# Patient Record
Sex: Female | Born: 1950
Health system: Southern US, Community
[De-identification: ages and names within clinical notes are randomized; demographics above are authoritative.]

## PROBLEM LIST (undated history)

## (undated) DIAGNOSIS — K760 Fatty (change of) liver, not elsewhere classified: Secondary | ICD-10-CM

## (undated) DIAGNOSIS — K219 Gastro-esophageal reflux disease without esophagitis: Secondary | ICD-10-CM

## (undated) DIAGNOSIS — I739 Peripheral vascular disease, unspecified: Secondary | ICD-10-CM

## (undated) DIAGNOSIS — M869 Osteomyelitis, unspecified: Secondary | ICD-10-CM

## (undated) DIAGNOSIS — IMO0002 Reserved for concepts with insufficient information to code with codable children: Secondary | ICD-10-CM

## (undated) DIAGNOSIS — I10 Essential (primary) hypertension: Secondary | ICD-10-CM

## (undated) HISTORY — PX: WISDOM TOOTH EXTRACTION: SHX21

## (undated) HISTORY — PX: TUBAL LIGATION: SHX77

## (undated) HISTORY — PX: OTHER SURGICAL HISTORY: SHX169

## (undated) HISTORY — DX: Reserved for concepts with insufficient information to code with codable children: IMO0002

---

## 2000-08-08 ENCOUNTER — Encounter: Payer: Self-pay | Admitting: Internal Medicine

## 2000-08-08 ENCOUNTER — Encounter: Admission: RE | Admit: 2000-08-08 | Discharge: 2000-08-08 | Payer: Self-pay | Admitting: Internal Medicine

## 2005-03-19 ENCOUNTER — Ambulatory Visit (HOSPITAL_COMMUNITY): Admission: RE | Admit: 2005-03-19 | Discharge: 2005-03-19 | Payer: Self-pay | Admitting: Gastroenterology

## 2007-03-11 ENCOUNTER — Encounter: Admission: RE | Admit: 2007-03-11 | Discharge: 2007-03-11 | Payer: Self-pay | Admitting: Internal Medicine

## 2007-03-11 IMAGING — MG MM SCREEN MAMMOGRAM BILATERAL
5 series · 5 of 5 positions shown · non-contrast
Comparison: none

DG SCREEN MAMMOGRAM BILATERAL
Bilateral CC and MLO view(s) were taken.

DIGITAL SCREENING MAMMOGRAM WITH CAD:
The breast tissue is almost entirely fatty.  A possible mass is noted in the left breast.  Spot 
compression views and possibly sonography are recommended for further evaluation.  The right breast
is unremarkable.

[R CC]
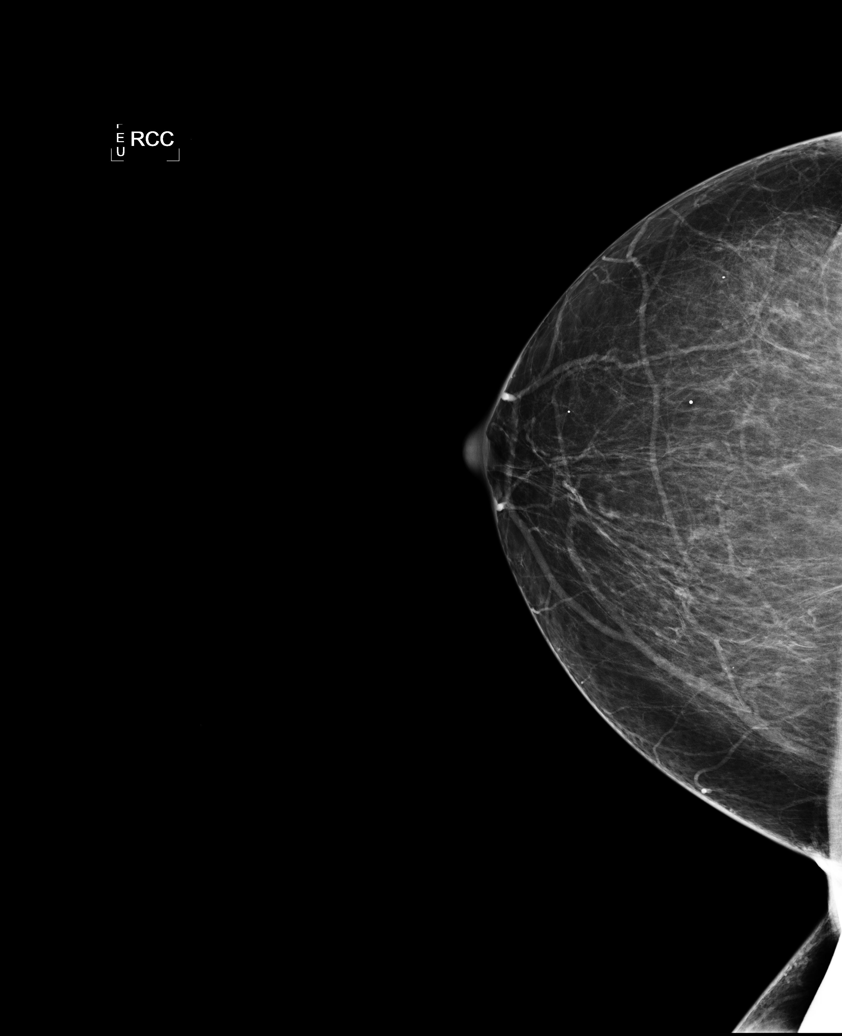

[L CC]
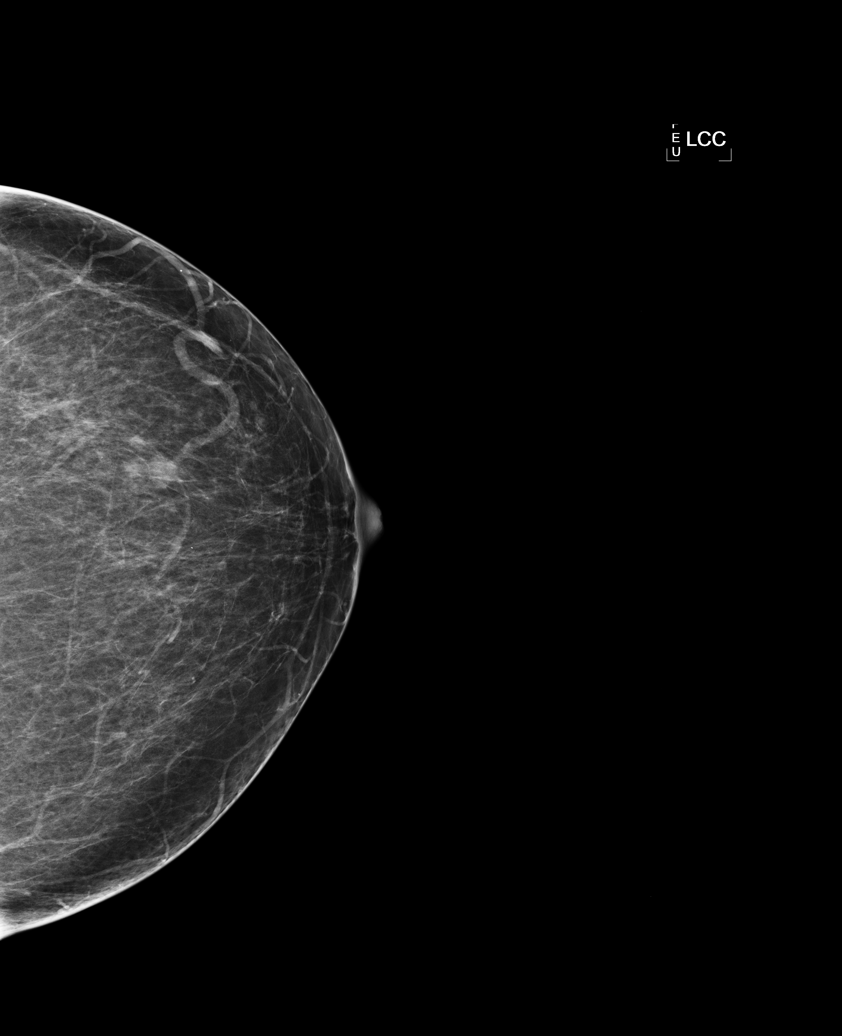

[L MLO (1 of 2)]
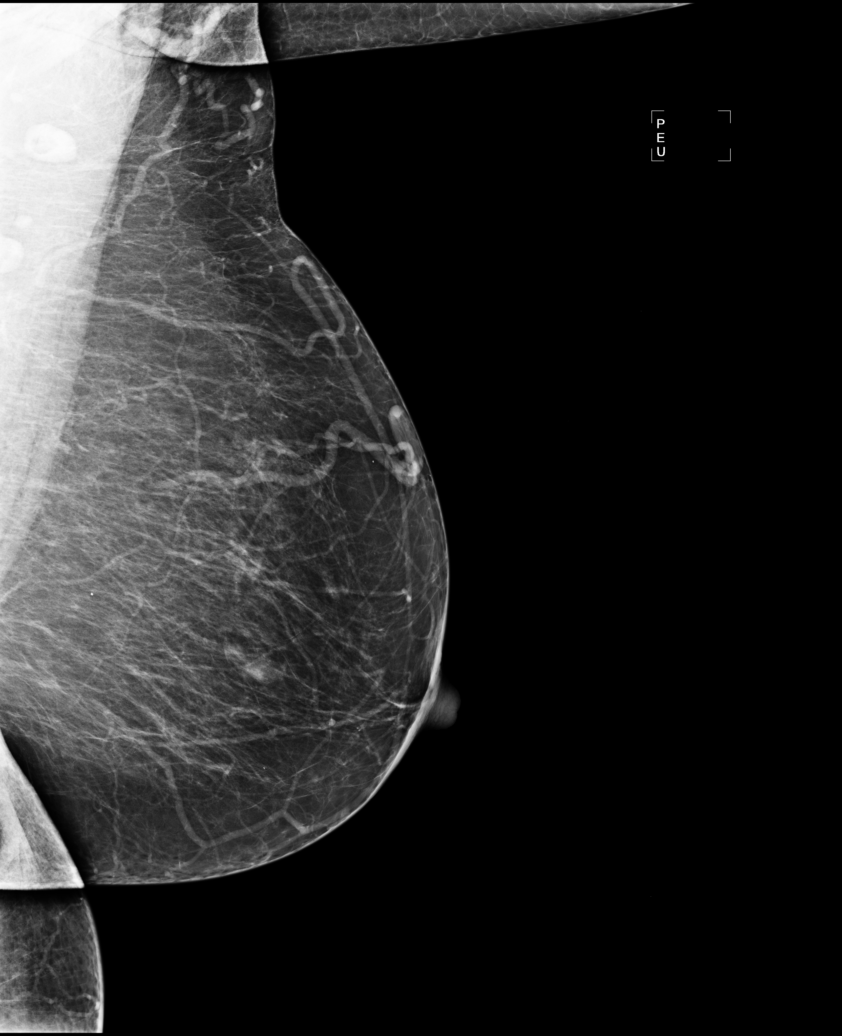

[R MLO]
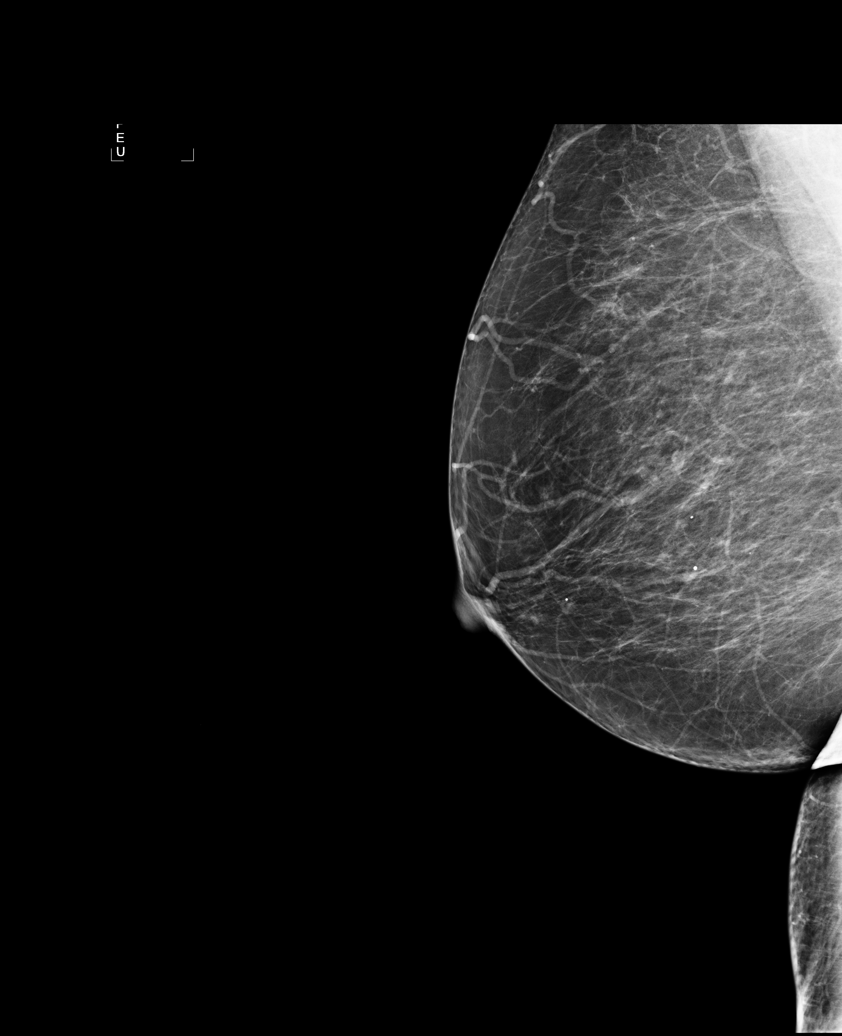

[L MLO (2 of 2)]
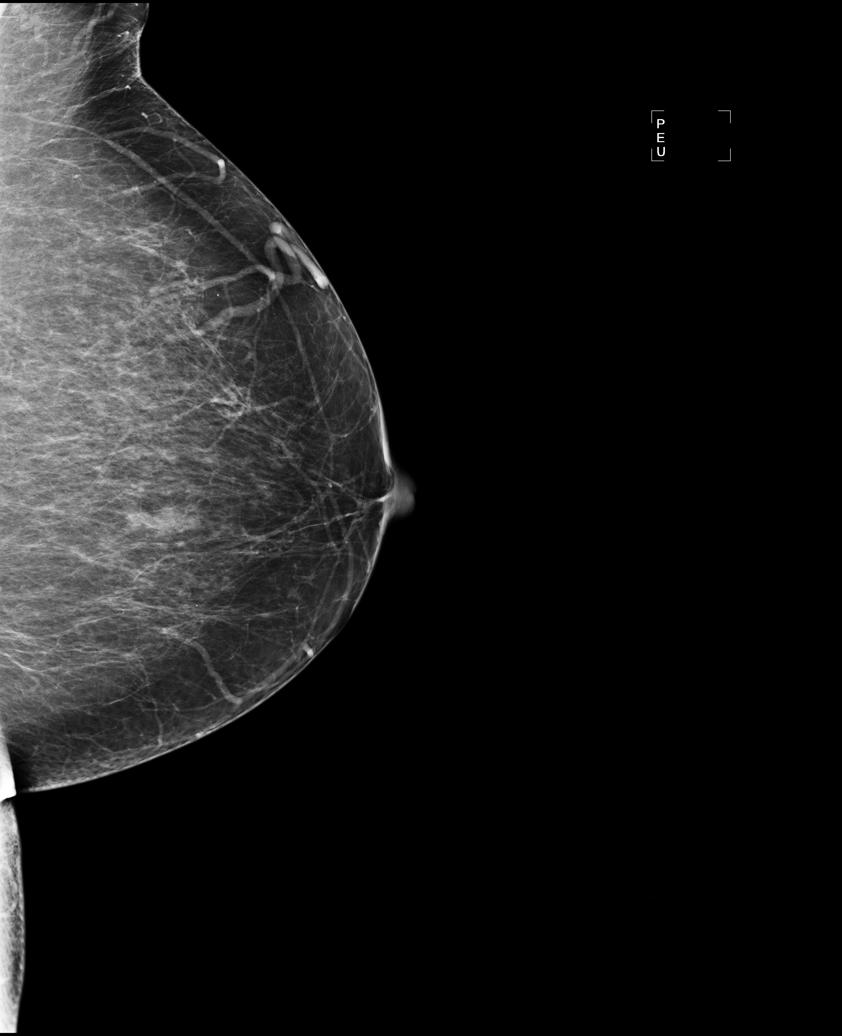

[5 of 5 positions shown; findings below may reference images not displayed]

IMPRESSION: Possible mass, left breast.  Additional evaluation is indicated.  The patient will be contacted for
additional studies and a supplemental report will follow

ASSESSMENT: Need additional imaging evaluation and/or prior mammograms for comparison - BI-RADS 0 -
Left

Further imaging of the left breast.
ANALYZED BY COMPUTER AIDED DETECTION. , THIS PROCEDURE WAS A DIGITAL MAMMOGRAM.

## 2007-03-18 ENCOUNTER — Encounter: Admission: RE | Admit: 2007-03-18 | Discharge: 2007-03-18 | Payer: Self-pay | Admitting: Internal Medicine

## 2007-03-18 IMAGING — US UNKNOWN PR STUDY
1 series · 5 of 5 positions shown · non-contrast
Comparison: none

Addendum Begins
Addendum

REVIEW OF OUTSIDE STUDY ([DATE])
Films from [DATE] performed at [facility] in [HOSPITAL] are now available for 
comparison. Comparison is also made  to [DATE] and [DATE].  The asymmetric density in the left 
breast is stable compared to that exam, consistent with benign process.  Given the long term 
stability, no further evaluation is thought to be necessary at this time.  Screening mammogram is 
suggested in [DATE].
ASSESSMENT:  Benign - BI-RADS 2
,
Addendum Ends
[REDACTED] LEFT
CC and MLO view(s) were taken of the left breast.
Technologist: MANDEMA
MANDEMA BREAST ULTRASOUND
Technologist: MANDEMA, Medical
DIGITAL LIMITED LEFT DIAGNOSTIC MAMMOGRAM AND LEFT BREAST ULTRASOUND:
CLINICAL DATA: 55-year-old returns after screening study on [DATE] for evaluation of the left 
breast.

[Series 1: unknown pr study · 5 of 5 slices shown]
[im 1/5]
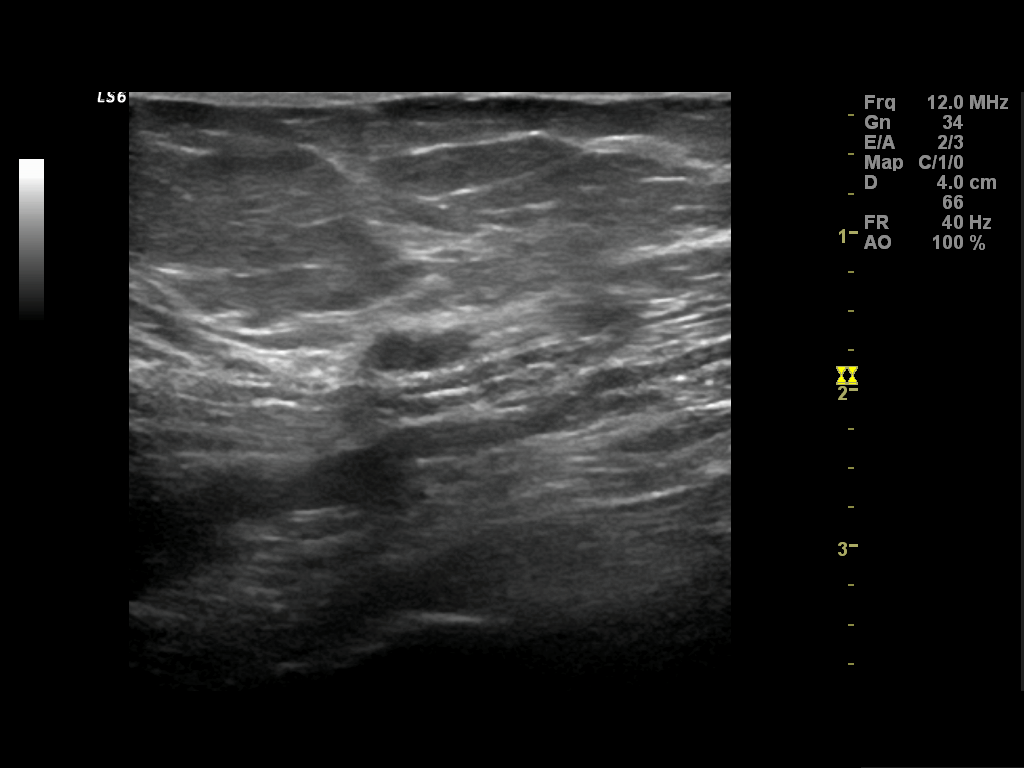
[im 2/5]
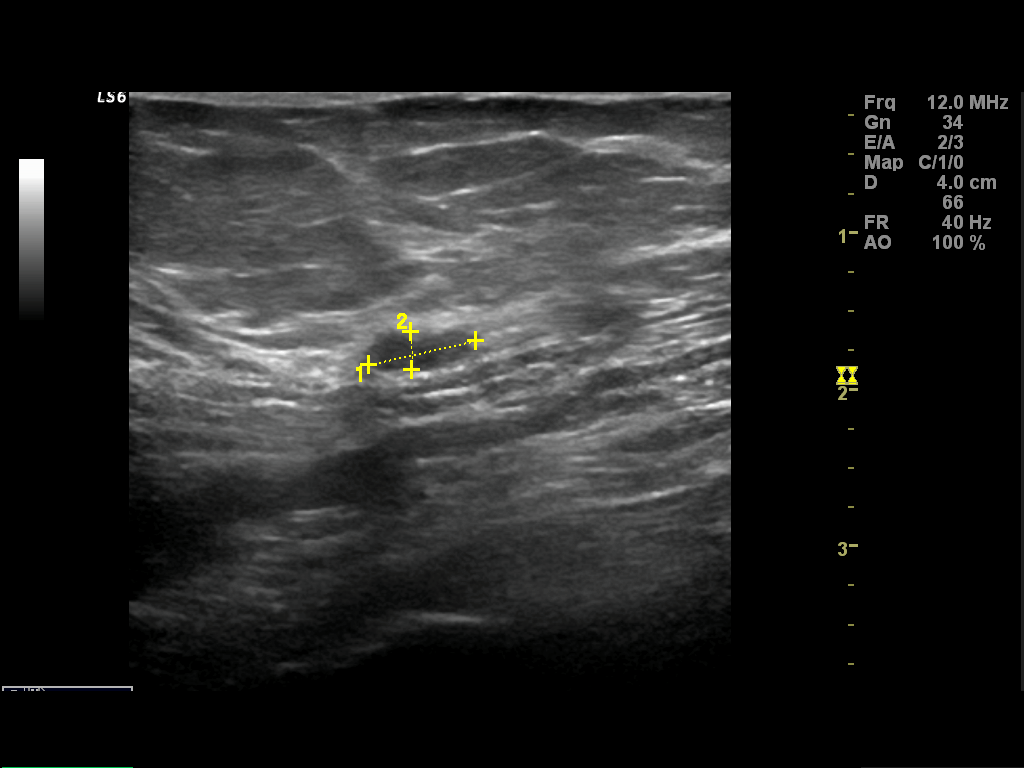
[im 3/5]
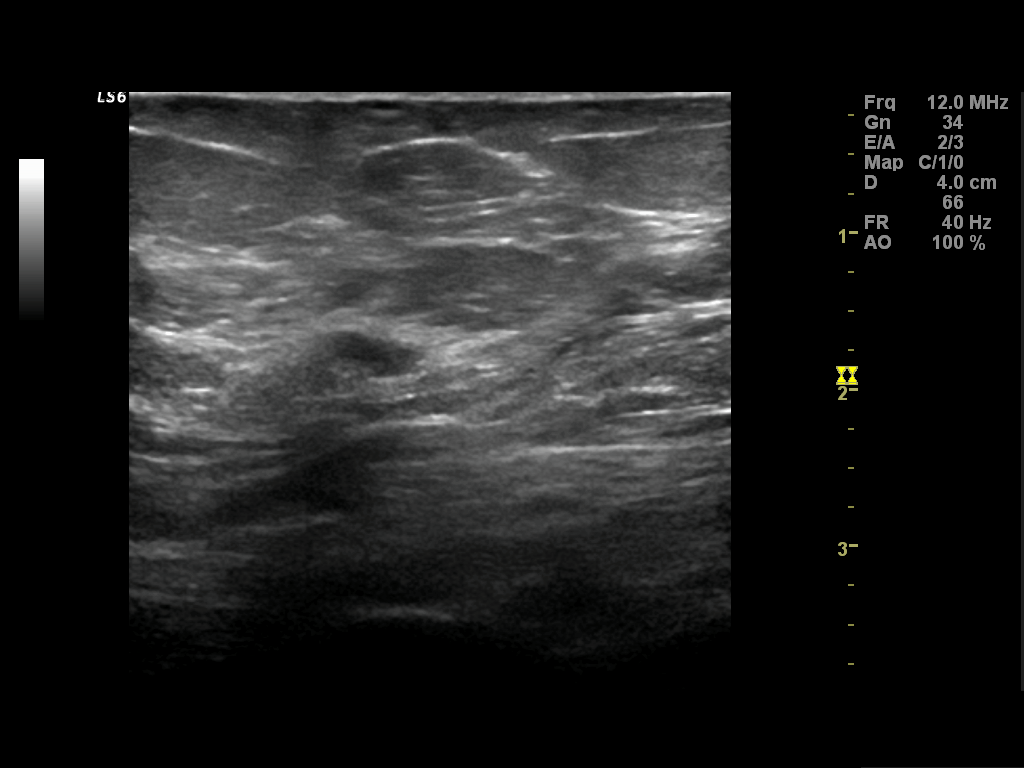
[im 4/5]
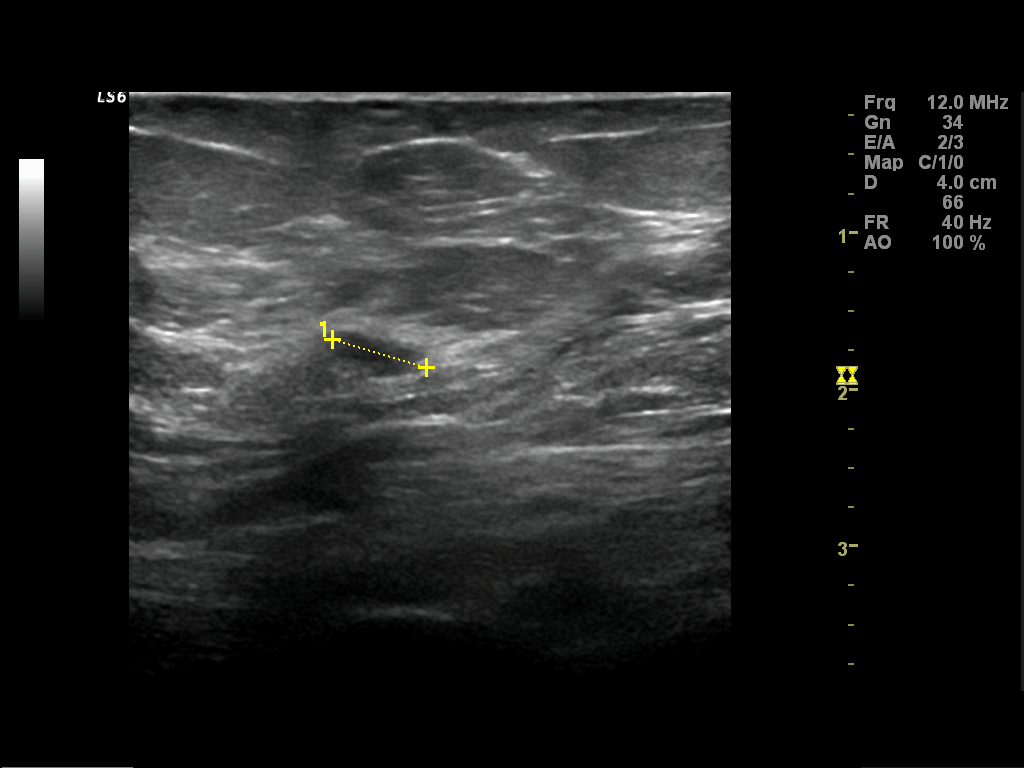
[im 5/5]
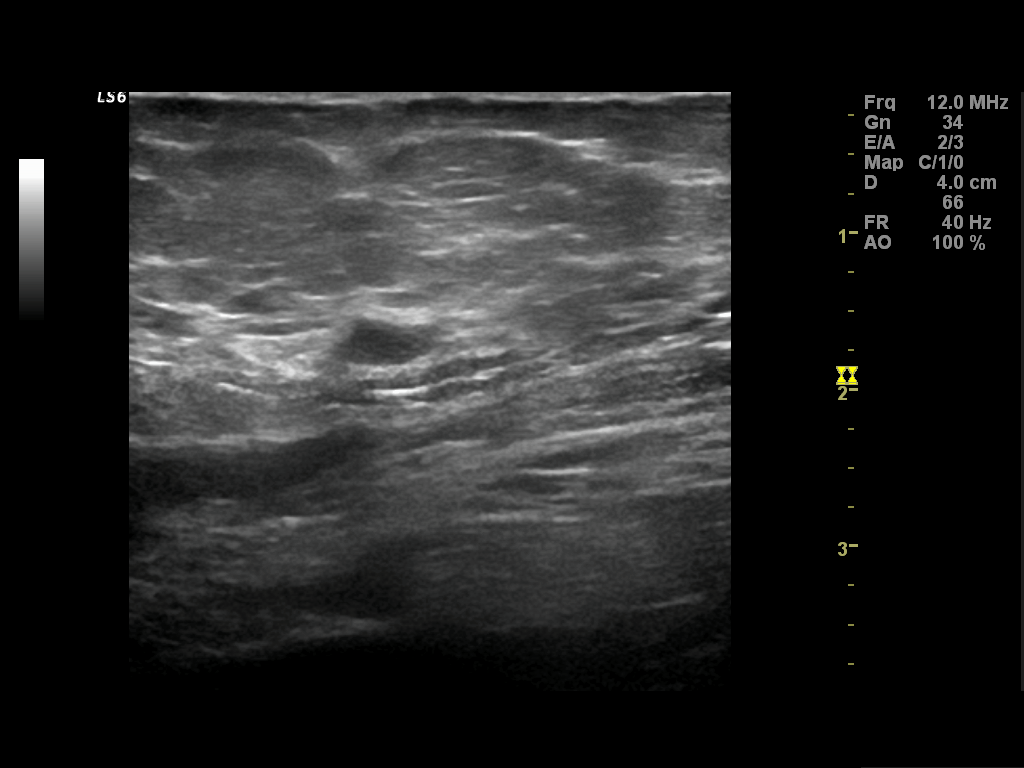

[5 of 5 positions shown; findings below may reference images not displayed]

Spot compression views show low density asymmetry just lateral to the left nipple. This is further 
evaluated on ultrasound.

On physical exam, I do not palpate an abnormality in the lateral part of the left breast.  
Ultrasound is performed in the lateral quadrants showing a small hypoechoic well-circumscribed 
nodule in the 4 o'clock position of the left breast 4 cm from the nipple.  This measures 7 x 2 x 6 
mm and correlates well with the region of mammographic abnormality.  The findings favor a small 
intramammary lymph node or small fibroadenoma.  I would suggest a follow-up left ultrasound and 
mammogram in six months to assess stability.
IMPRESSION: Probably benign left breast nodule.  The patient reports having had a mobile mammogram performed in
[4E] [4E] at [facility]. We will attempt to obtain that exam for direct comparison.  If 
prior studies cannot be obtained for comparison, I would suggest a follow-up mammogram and 
ultrasound in six months.

ASSESSMENT: Need additional imaging evaluation and/or prior mammograms for comparison - BI-RADS 0

Obtain prior study for comparison.
,

## 2007-03-18 IMAGING — MG MM DIAGNOSTIC LTD LEFT
2 series · 2 of 2 positions shown · non-contrast
Comparison: none

Addendum Begins
Addendum

REVIEW OF OUTSIDE STUDY ([DATE])
Films from [DATE] performed at [facility] in [HOSPITAL] are now available for 
comparison. Comparison is also made  to [DATE] and [DATE].  The asymmetric density in the left 
breast is stable compared to that exam, consistent with benign process.  Given the long term 
stability, no further evaluation is thought to be necessary at this time.  Screening mammogram is 
suggested in [DATE].
ASSESSMENT:  Benign - BI-RADS 2
,
Addendum Ends
[REDACTED] LEFT
CC and MLO view(s) were taken of the left breast.
Technologist: MANDEMA
MANDEMA BREAST ULTRASOUND
Technologist: MANDEMA, Medical
DIGITAL LIMITED LEFT DIAGNOSTIC MAMMOGRAM AND LEFT BREAST ULTRASOUND:
CLINICAL DATA: 55-year-old returns after screening study on [DATE] for evaluation of the left 
breast.

[L CC]
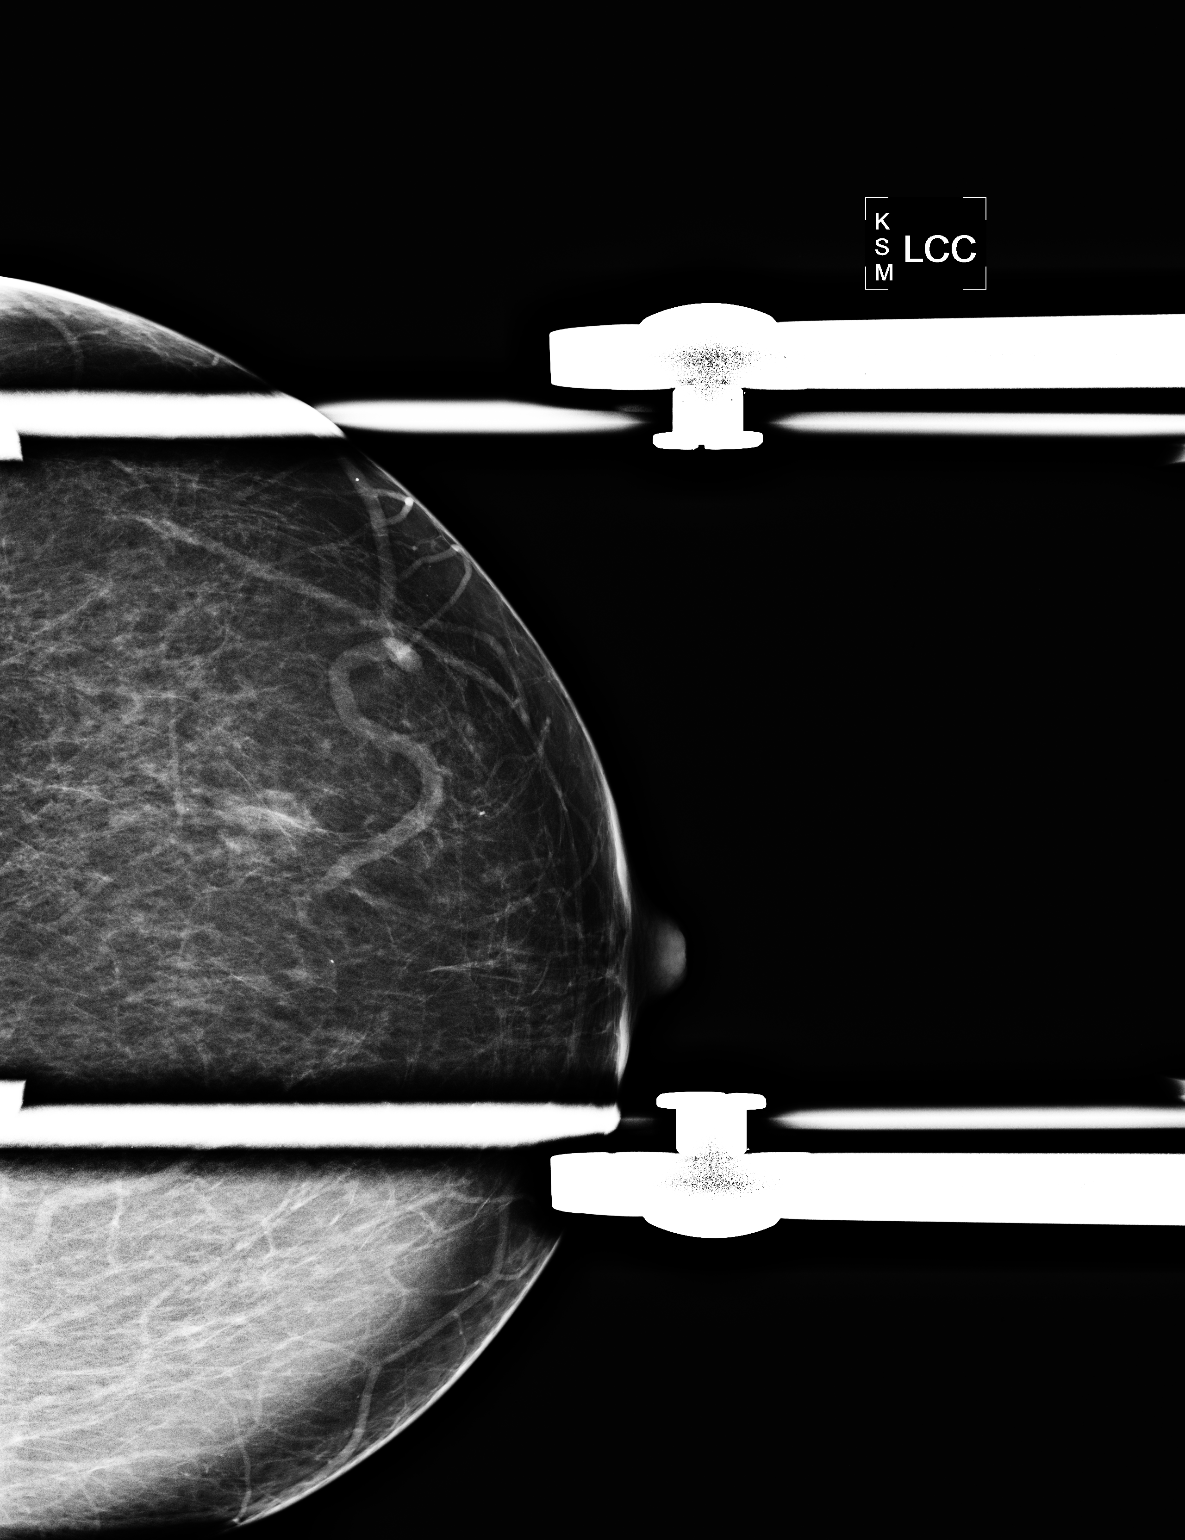

[L MLO]
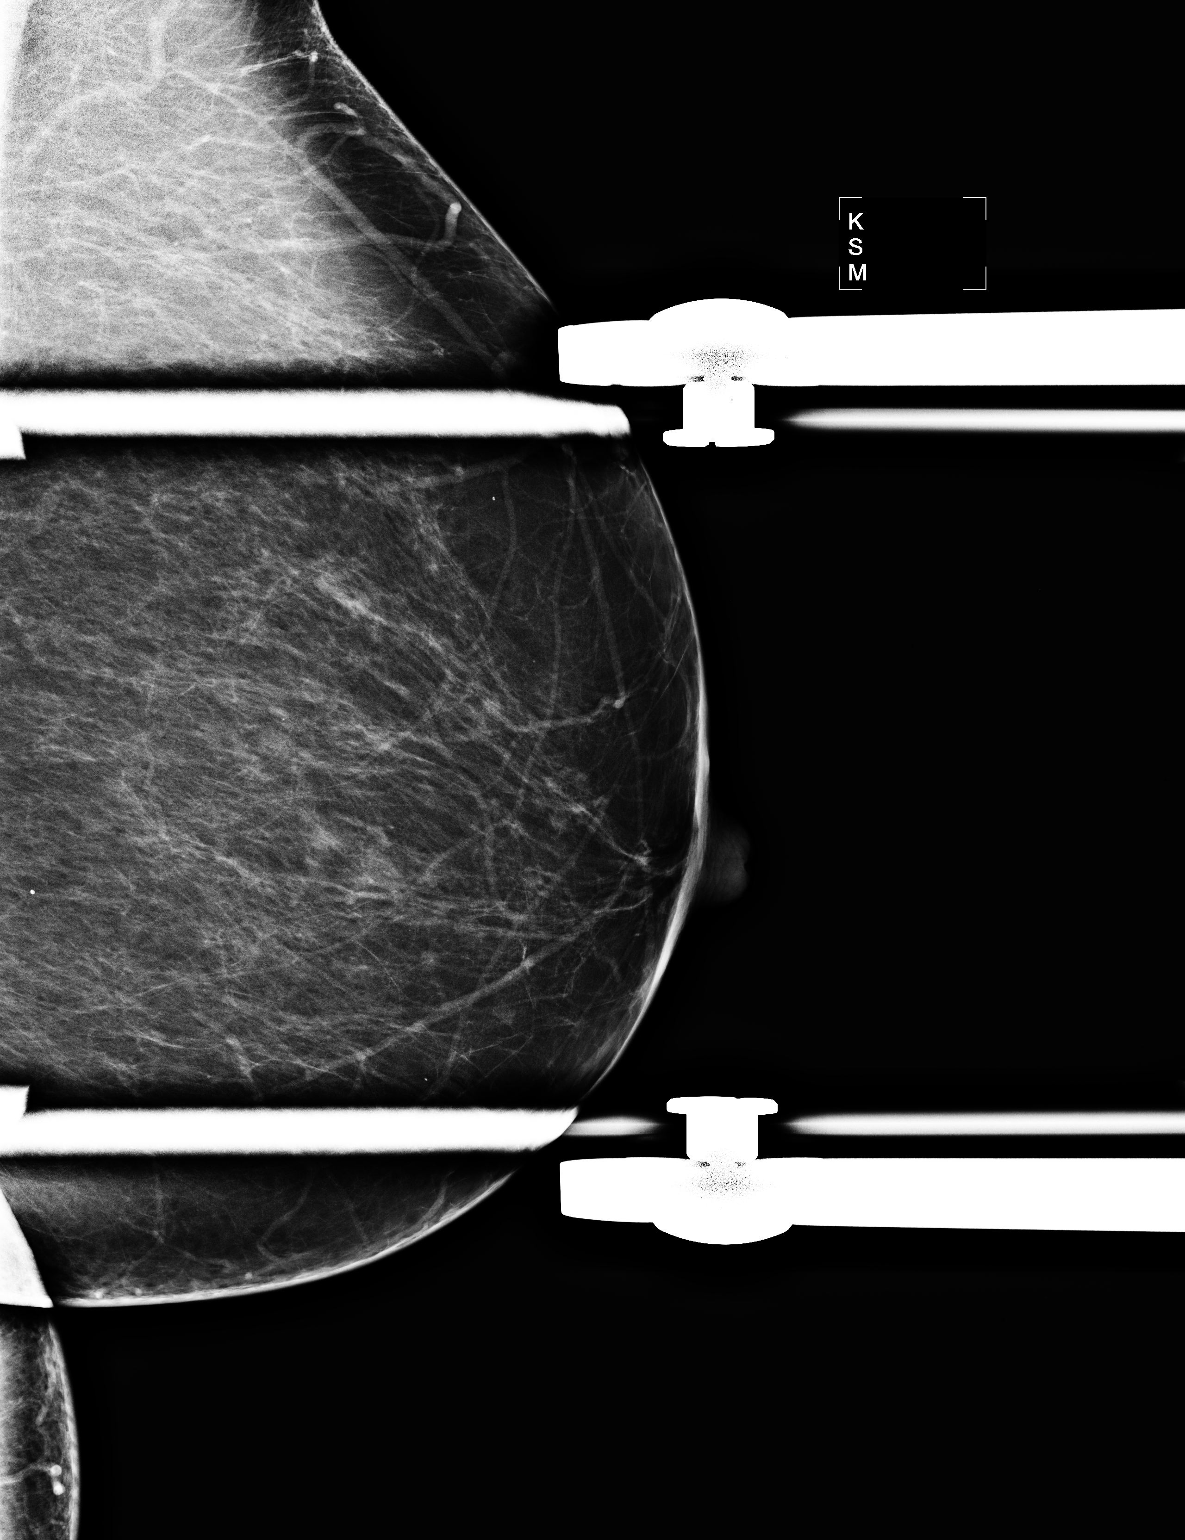

[2 of 2 positions shown; findings below may reference images not displayed]

Spot compression views show low density asymmetry just lateral to the left nipple. This is further 
evaluated on ultrasound.

On physical exam, I do not palpate an abnormality in the lateral part of the left breast.  
Ultrasound is performed in the lateral quadrants showing a small hypoechoic well-circumscribed 
nodule in the 4 o'clock position of the left breast 4 cm from the nipple.  This measures 7 x 2 x 6 
mm and correlates well with the region of mammographic abnormality.  The findings favor a small 
intramammary lymph node or small fibroadenoma.  I would suggest a follow-up left ultrasound and 
mammogram in six months to assess stability.
IMPRESSION: Probably benign left breast nodule.  The patient reports having had a mobile mammogram performed in
[4E] [4E] at [facility]. We will attempt to obtain that exam for direct comparison.  If 
prior studies cannot be obtained for comparison, I would suggest a follow-up mammogram and 
ultrasound in six months.

ASSESSMENT: Need additional imaging evaluation and/or prior mammograms for comparison - BI-RADS 0

Obtain prior study for comparison.
,

## 2008-03-28 ENCOUNTER — Encounter: Admission: RE | Admit: 2008-03-28 | Discharge: 2008-03-28 | Payer: Self-pay | Admitting: Internal Medicine

## 2008-03-28 IMAGING — MG MM DIAGNOSTIC BILATERAL
4 series · 4 of 4 positions shown · non-contrast
Comparison: Mobile unit mammogram [facility]HOSPITAL]
[DATE], [DATE]

Addendum Begins

Due to a scheduling error, the patient was erroneously
scheduled for diagnostic mammography rather than screening
mammography as recommended in the addendum of a previous
report dated [DATE](addendum dictated [DATE]).  The
report, conclusions, and recommendations are correct as
stated in the [DATE] report but the title of the report
should be changed to a screening mammogram and the patient's
charges will reflect this change.
Addendum Ends
CLINICAL DATA: Follow up left breast nodularity.  The patient was
to return for 6-month follow-up in [DATE] but did not return.
She presents today for annual mammography.
DIGITAL DIAGNOSTIC BILATERAL MAMMOGRAM WITH CAD

[R CC]
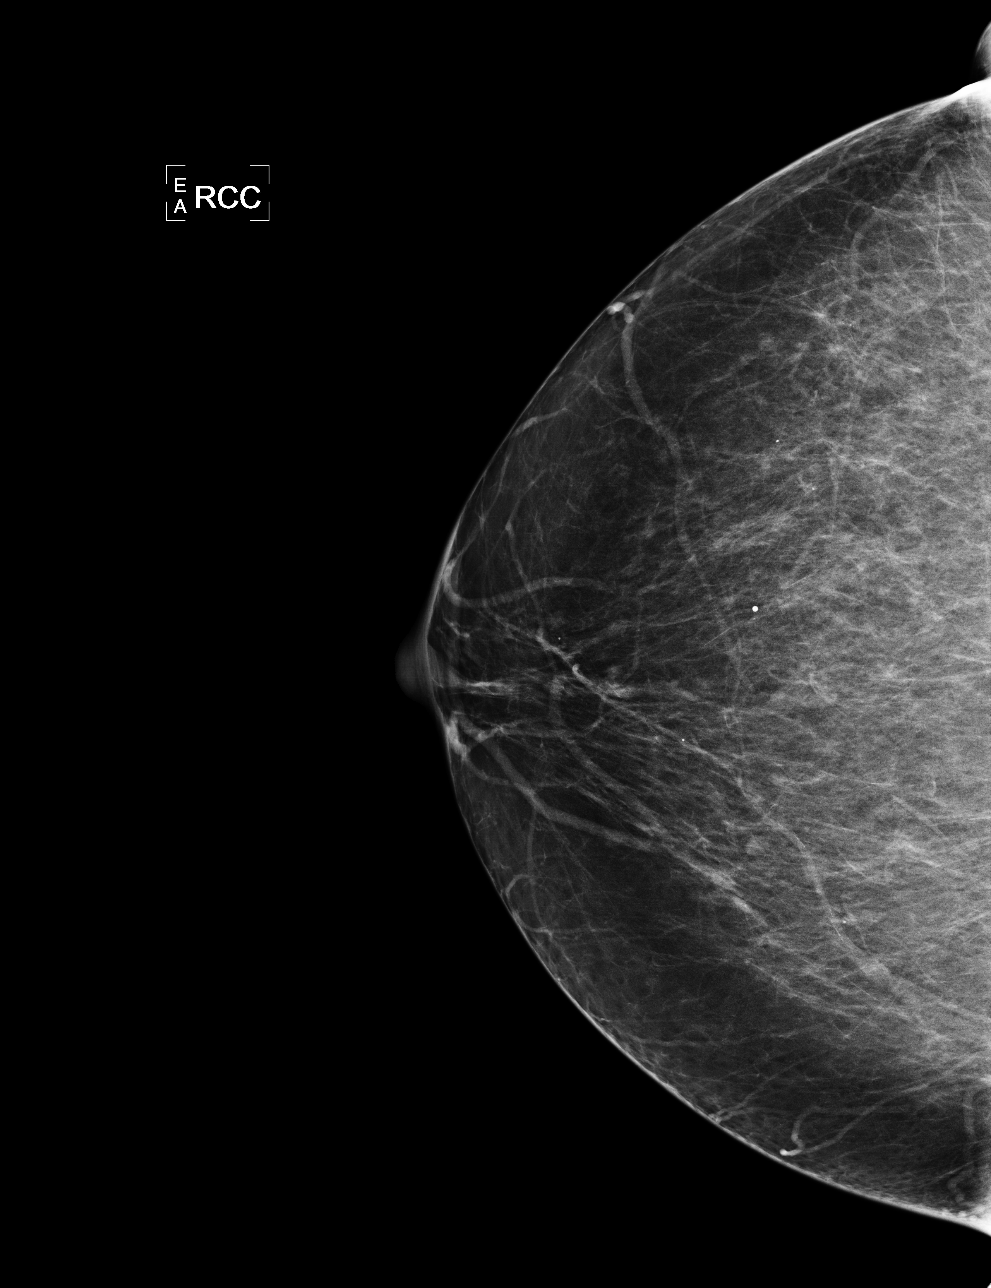

[L CC]
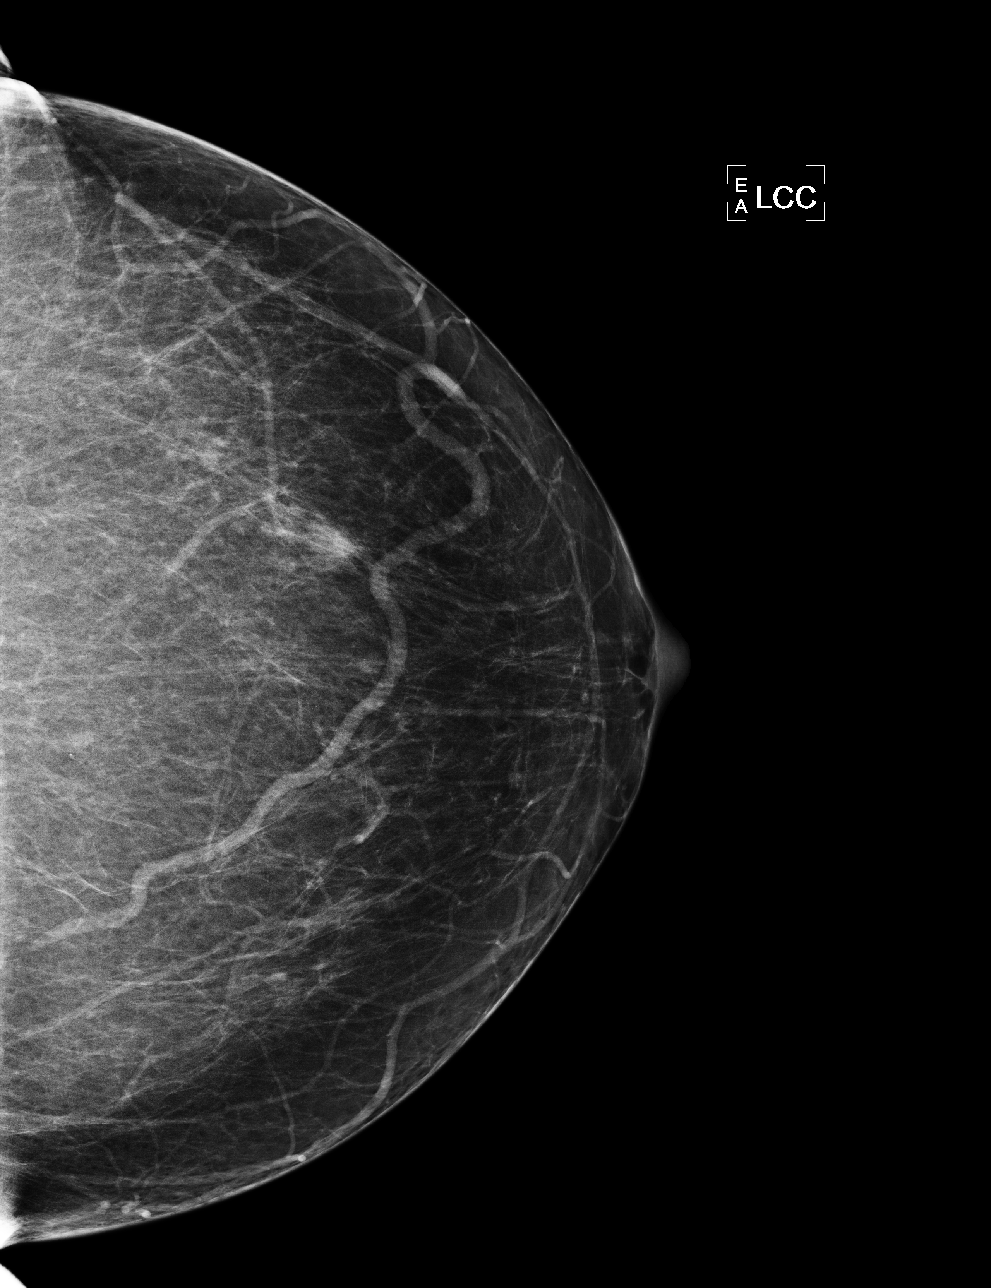

[L MLO]
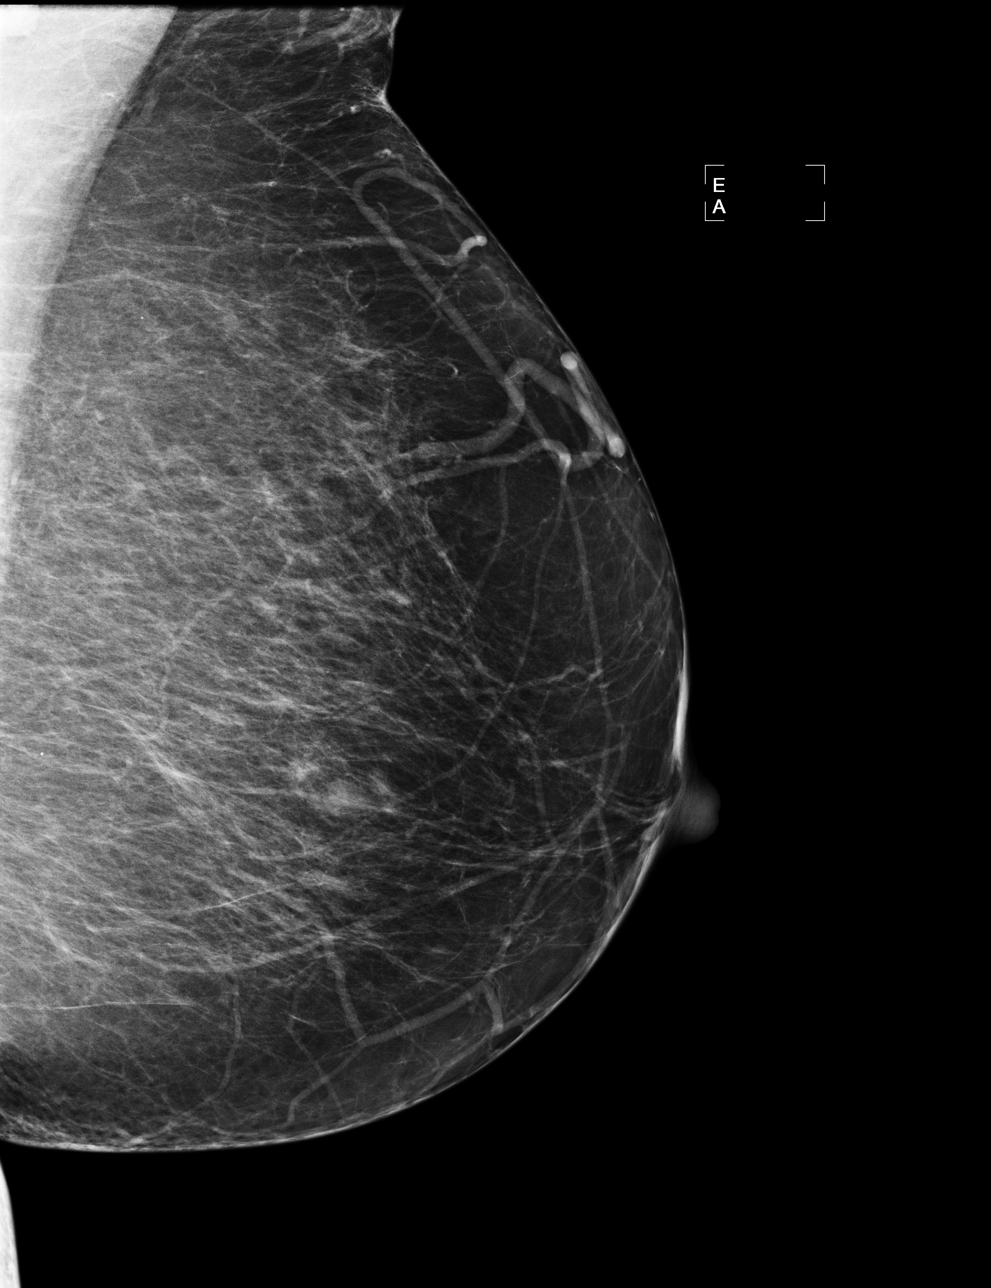

[R MLO]
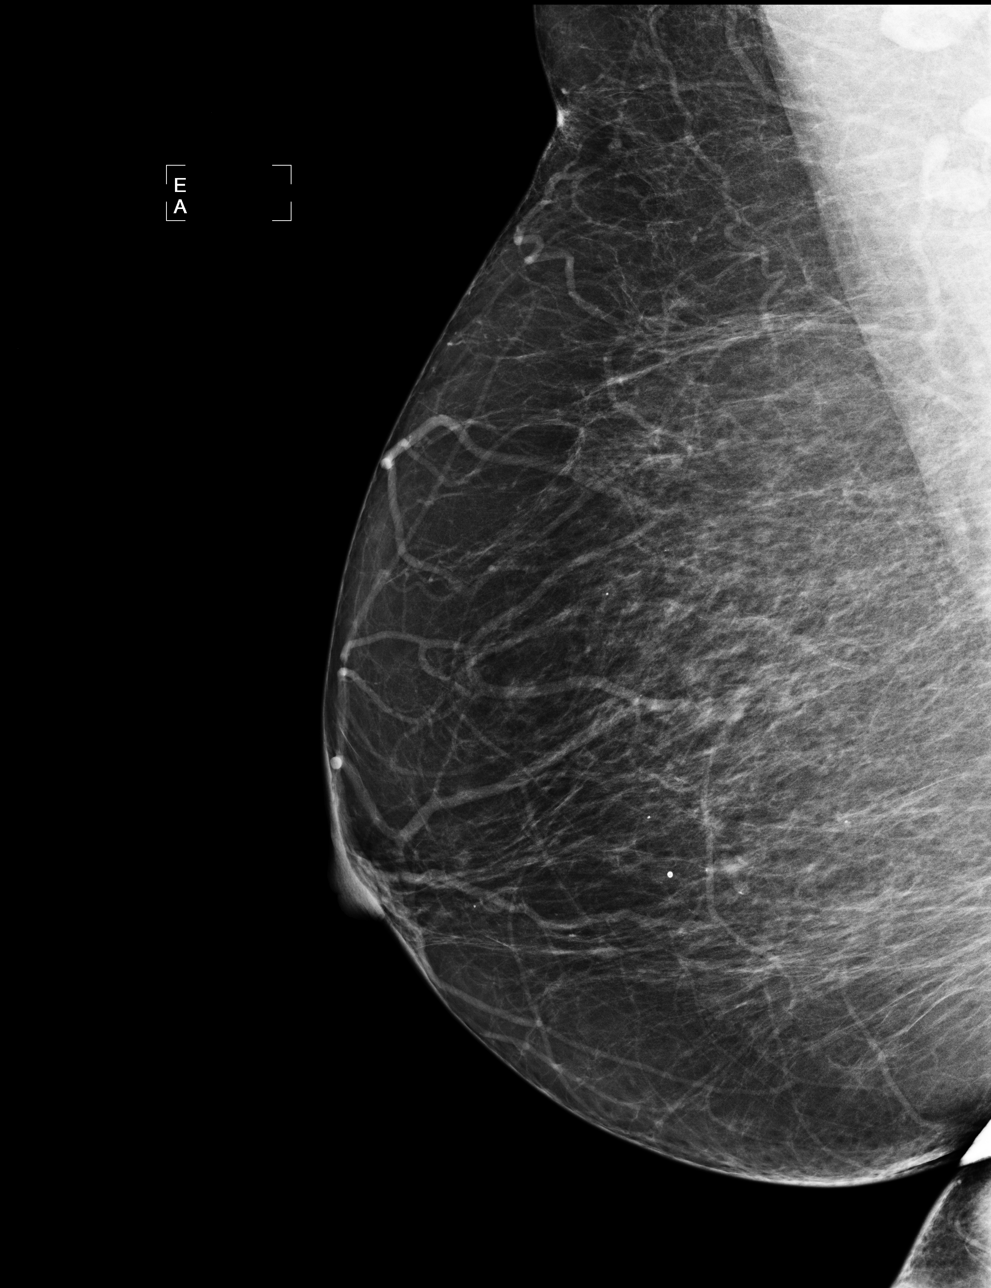

[4 of 4 positions shown; findings below may reference images not displayed]

FINDINGS: The breast parenchyma demonstrates scattered
fibroglandular densities.  No suspicious mass, calcification, or
architectural distortion is seen. An area of asymmetric appearing
glandular tissue in the left lateral breast is stable since at
least [2L].
IMPRESSION: No evidence for malignancy in either breast. Screening mammography
is recommended in one year. Findings and recommendations discussed
with the patient and provided in written form at the time of the
exam.

BI-RADS CATEGORY 1:  Negative.

## 2009-07-24 ENCOUNTER — Encounter: Admission: RE | Admit: 2009-07-24 | Discharge: 2009-07-24 | Payer: Self-pay | Admitting: Internal Medicine

## 2009-07-24 IMAGING — MG MM DIGITAL SCREENING
4 series · 4 of 4 positions shown · non-contrast
Comparison: none

DG SCREEN MAMMOGRAM BILATERAL
Bilateral CC and MLO view(s) were taken.

DIGITAL SCREENING MAMMOGRAM WITH CAD:
The breast tissue is almost entirely fatty.  No masses or malignant type calcifications are 
identified.  Compared with prior studies.
Images were processed with CAD.

[R CC]
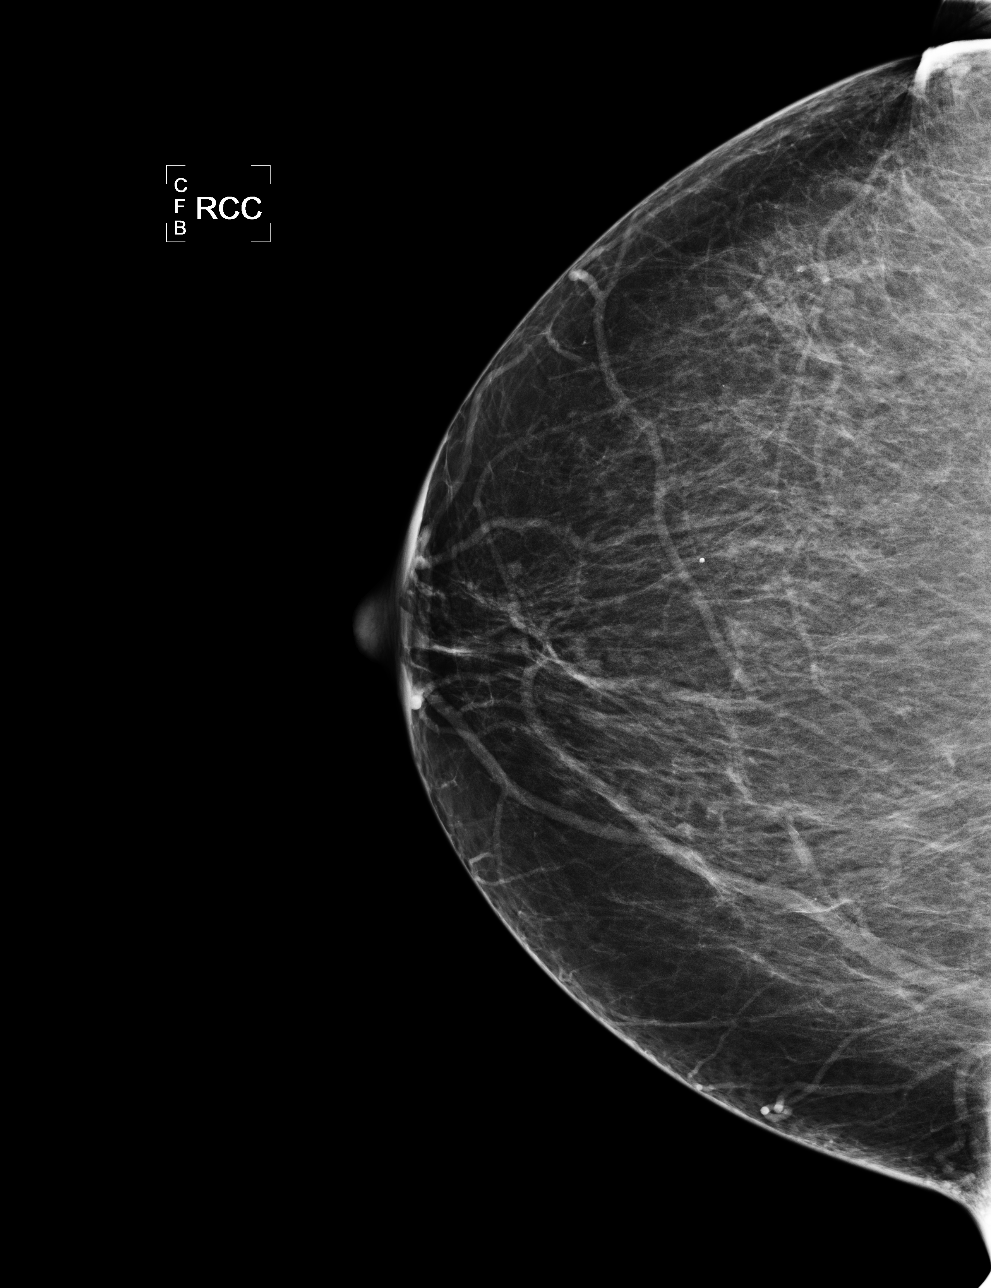

[L CC]
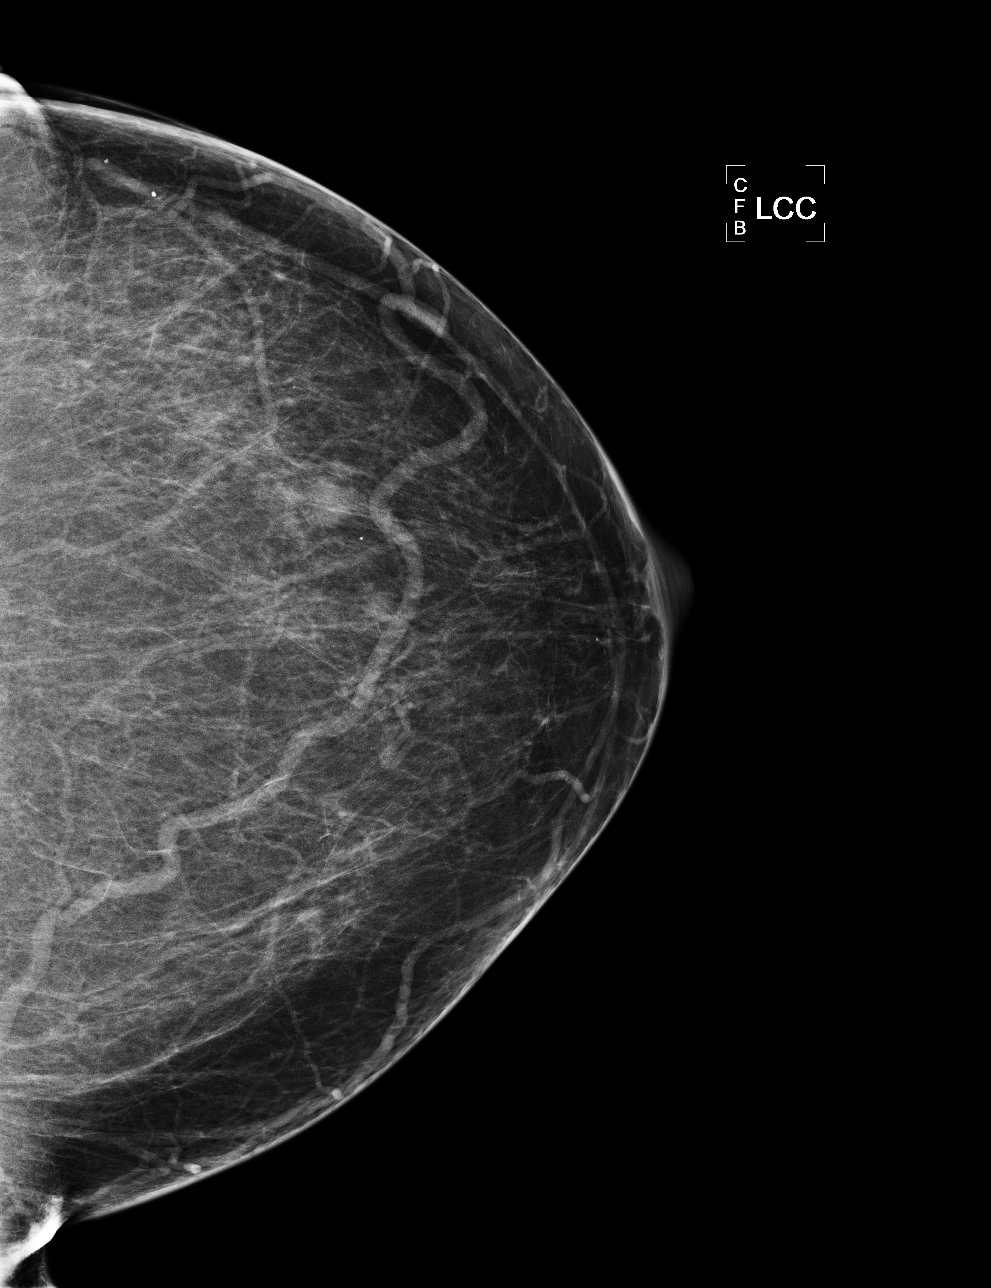

[L MLO]
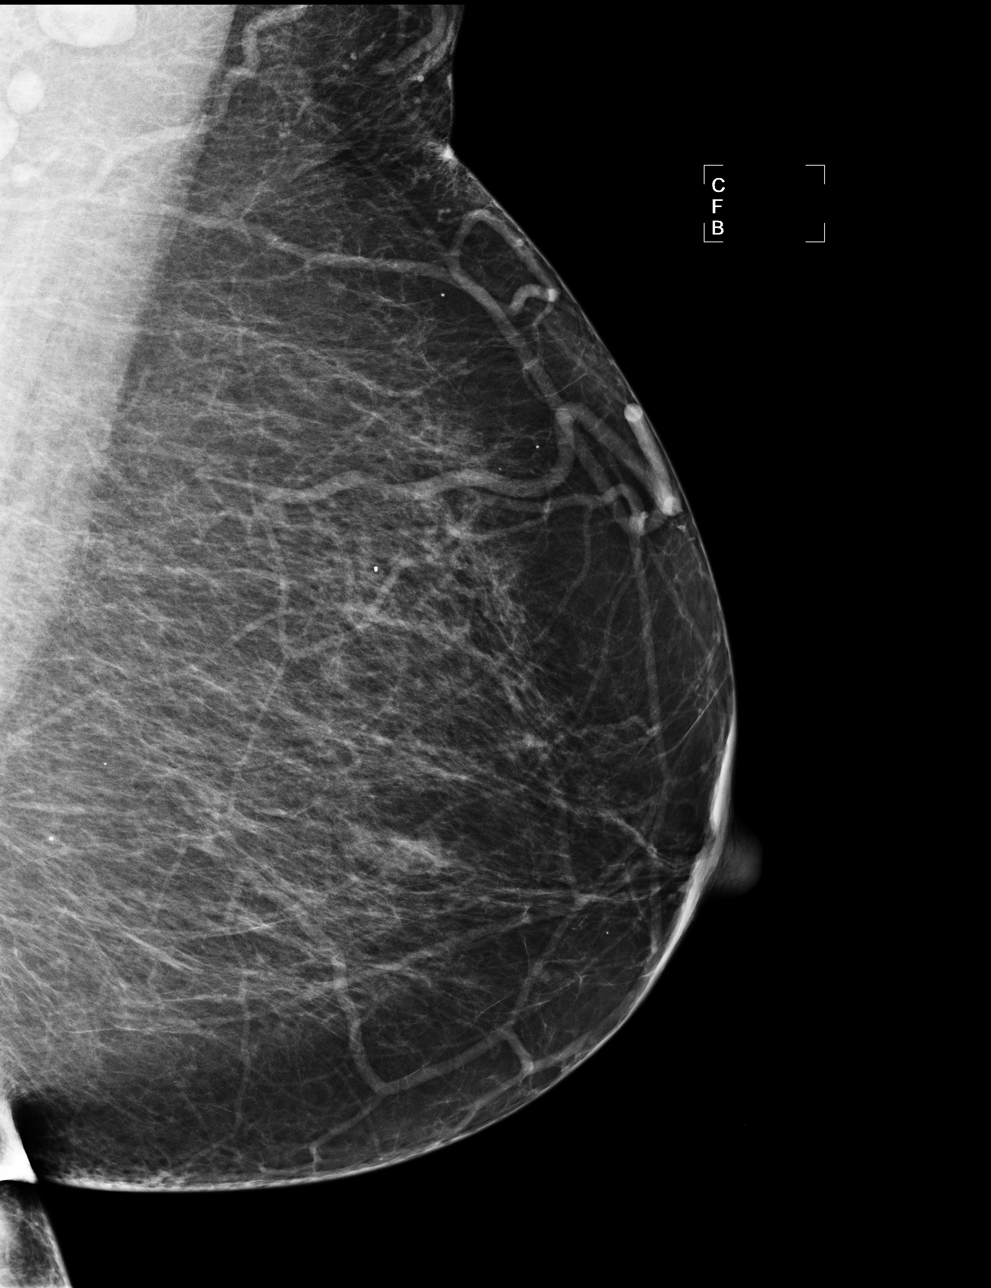

[R MLO]
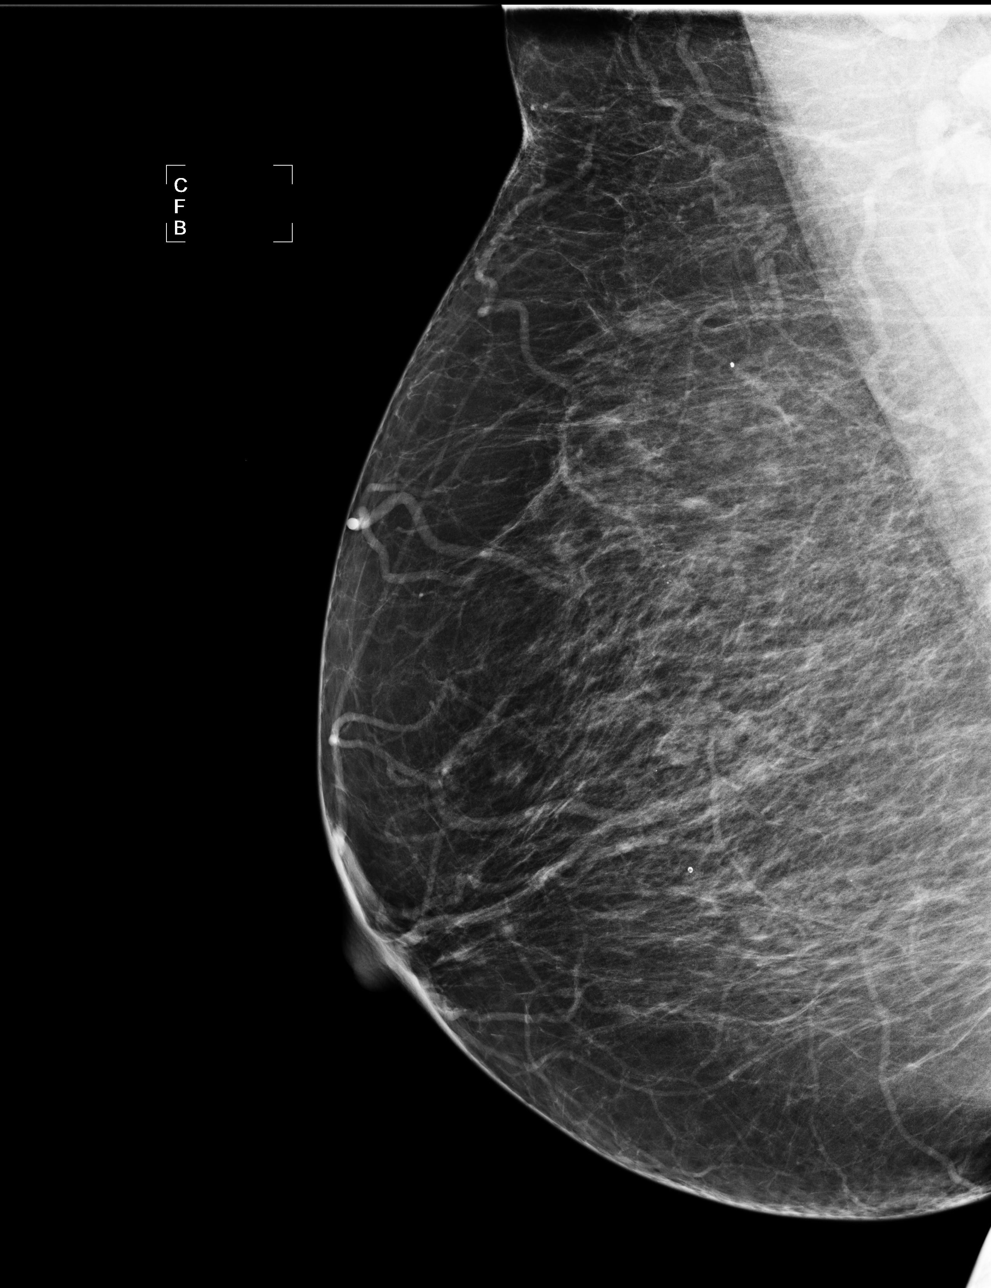

[4 of 4 positions shown; findings below may reference images not displayed]

IMPRESSION: No specific mammographic evidence of malignancy.  Next screening mammogram is recommended in one 
year.

A result letter of this screening mammogram will be mailed directly to the patient.

ASSESSMENT: Negative - BI-RADS 1

Screening mammogram in 1 year.
,

## 2010-07-25 ENCOUNTER — Encounter
Admission: RE | Admit: 2010-07-25 | Discharge: 2010-07-25 | Payer: Self-pay | Source: Home / Self Care | Attending: Internal Medicine | Admitting: Internal Medicine

## 2010-07-25 IMAGING — MG MM DIGITAL SCREENING
6 series · 6 of 6 positions shown · non-contrast
Comparison: none

DG SCREEN MAMMOGRAM BILATERAL
Bilateral CC and MLO view(s) were taken.

DIGITAL SCREENING MAMMOGRAM WITH CAD:
There are scattered fibroglandular densities.  No masses or malignant type calcifications are 
identified.  Compared with prior studies.
Images were processed with CAD.

[R CC (1 of 2)]
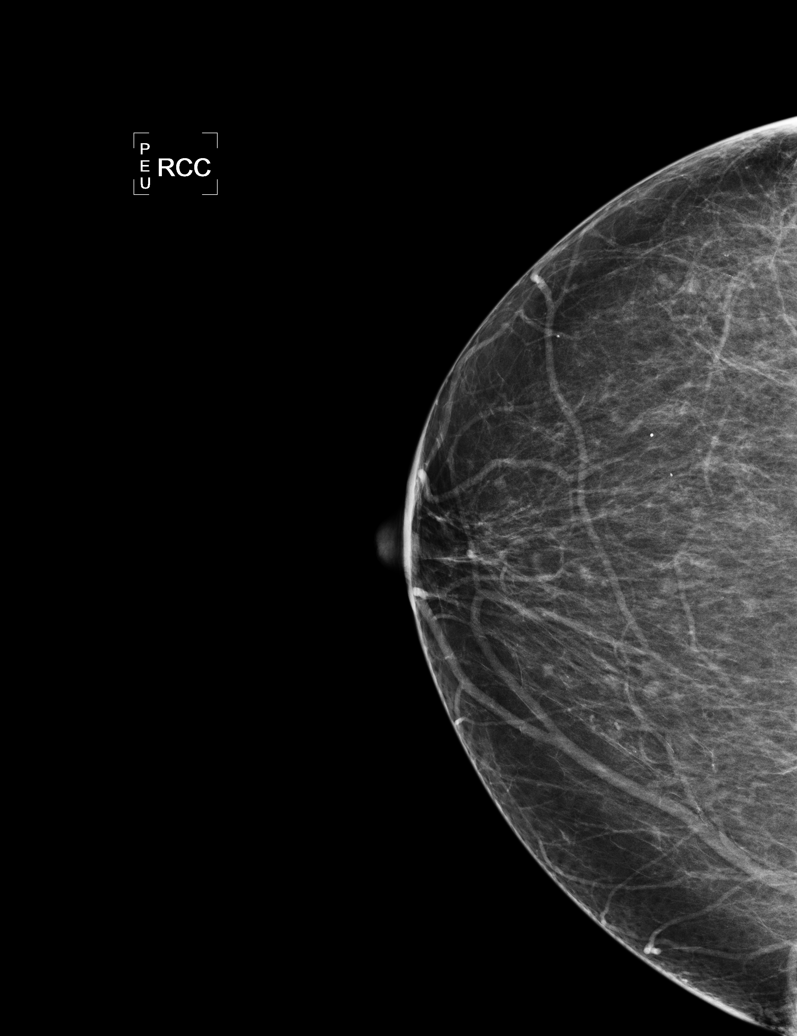

[L CC (1 of 2)]
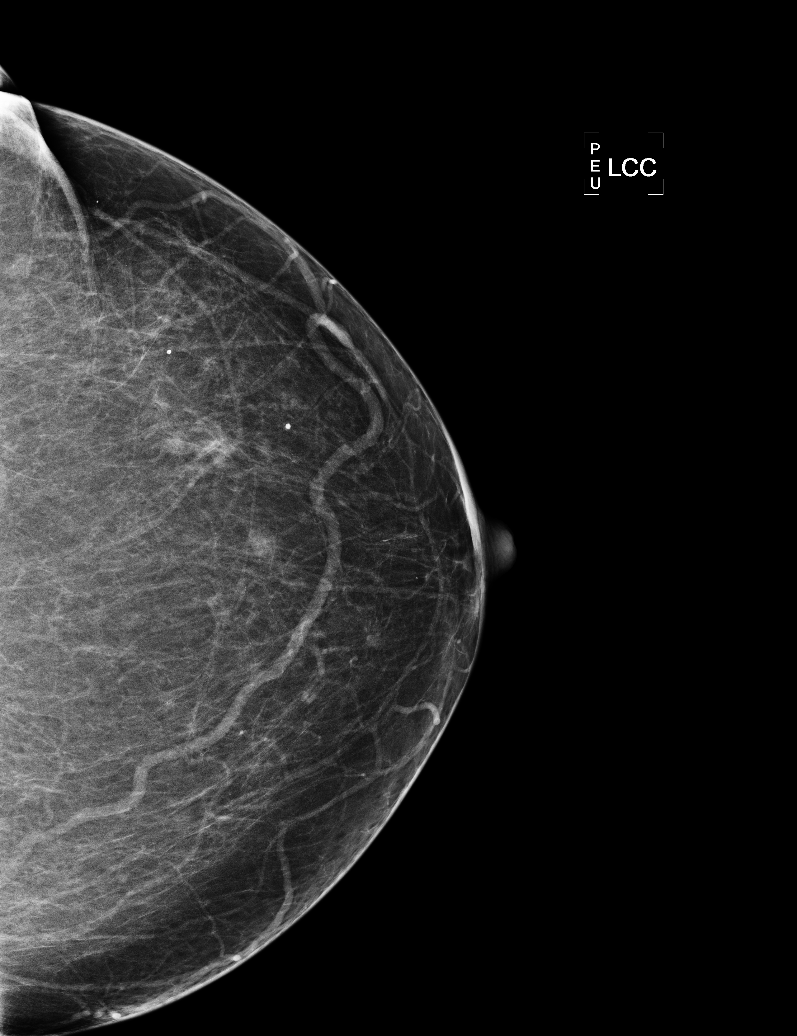

[L MLO]
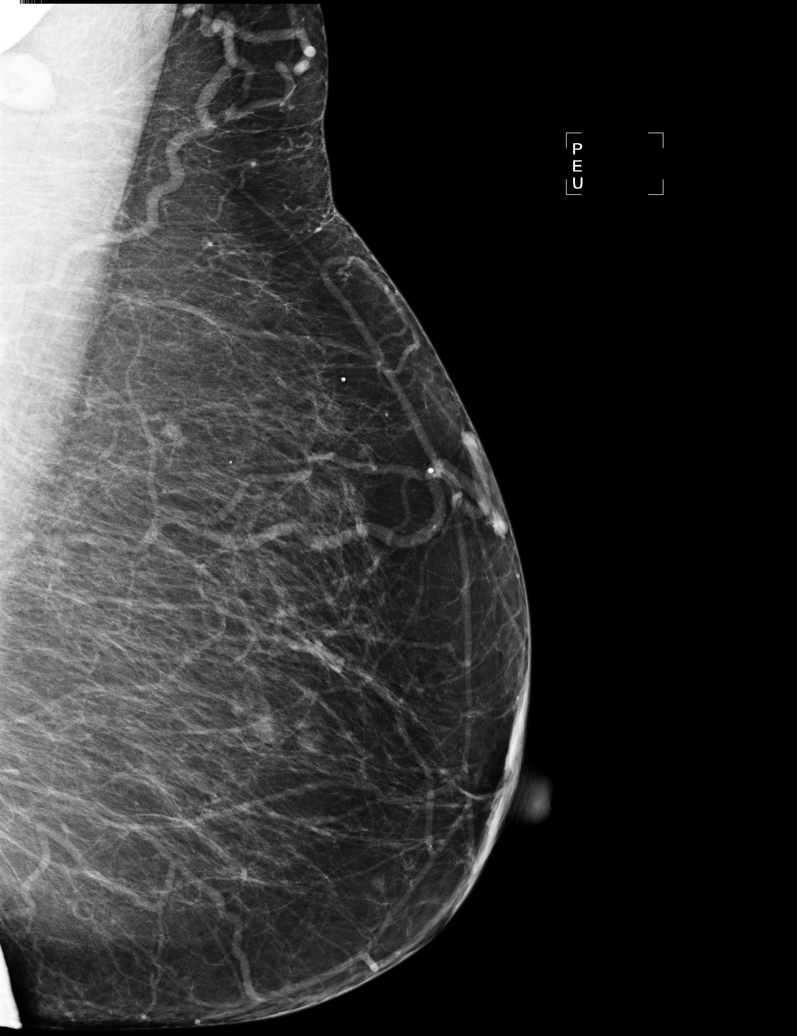

[R MLO]
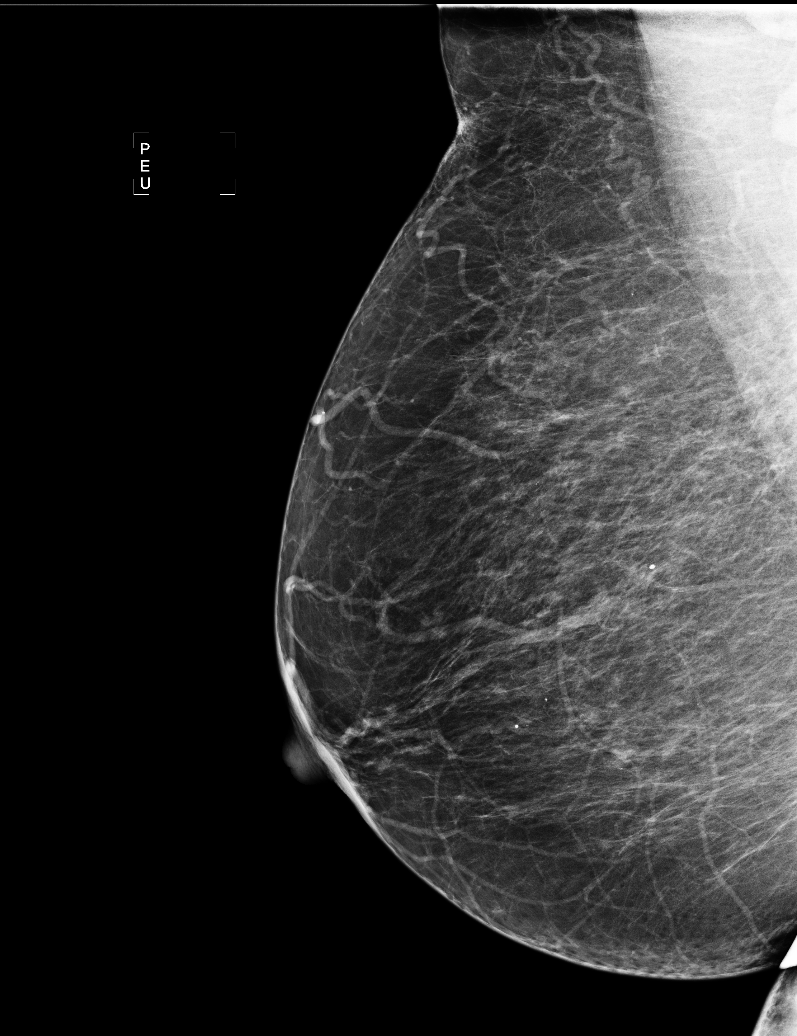

[R CC (2 of 2)]
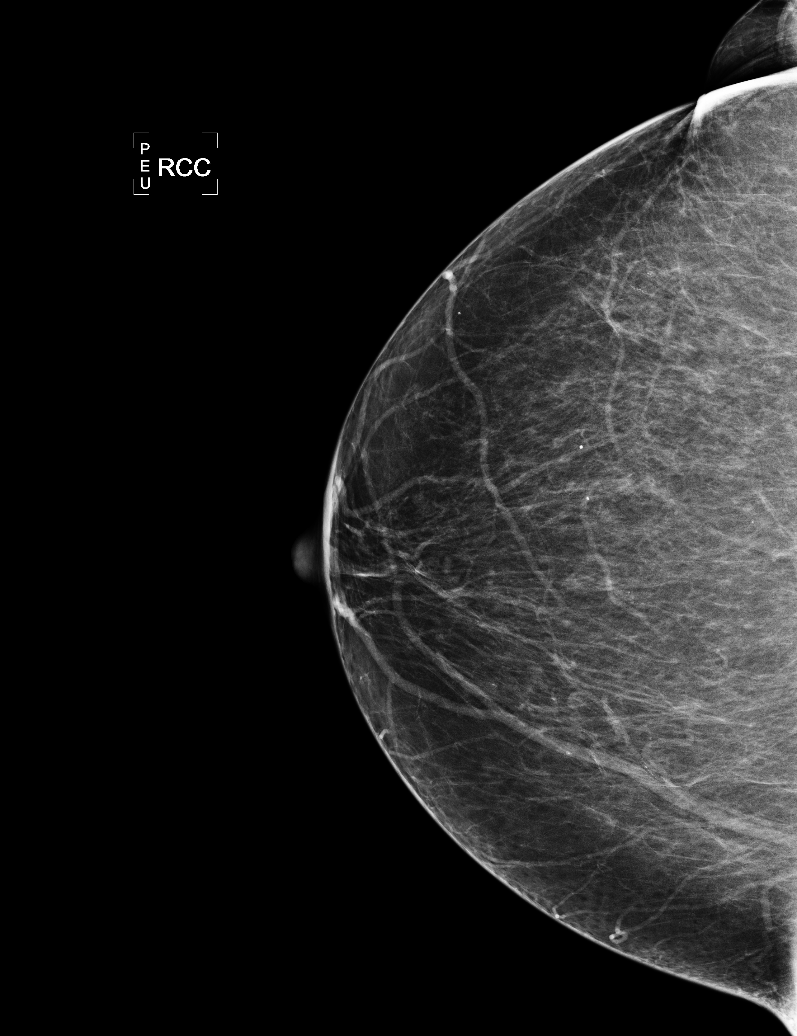

[L CC (2 of 2)]
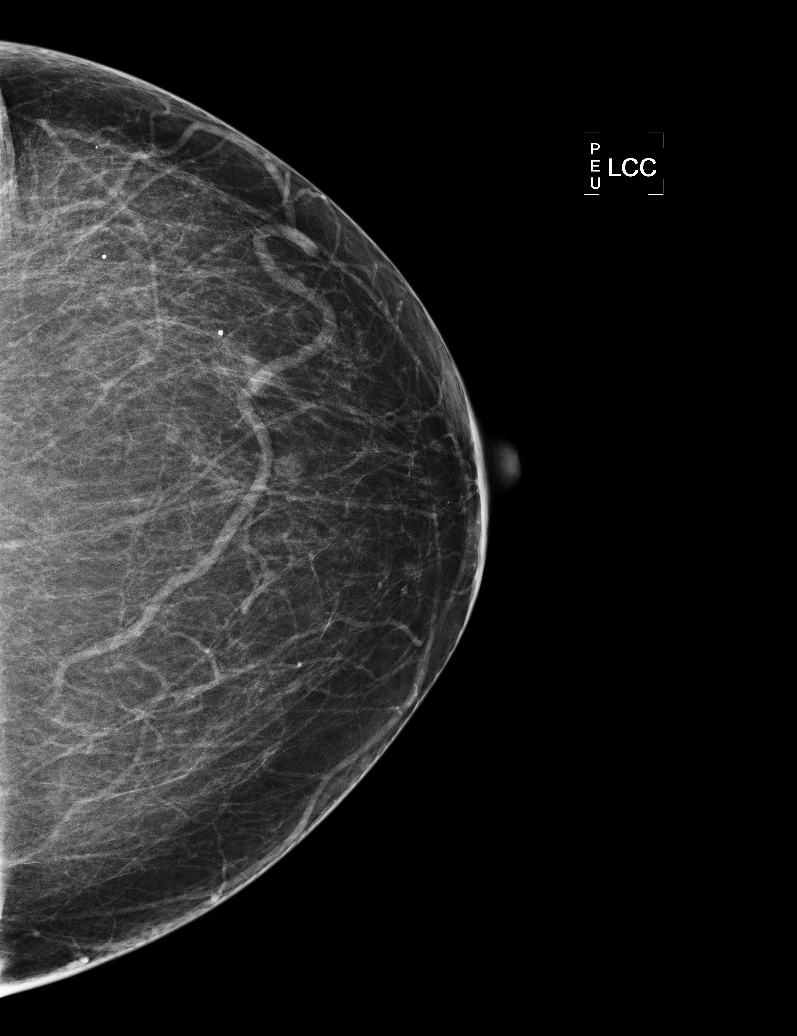

[6 of 6 positions shown; findings below may reference images not displayed]

IMPRESSION: No specific mammographic evidence of malignancy.  Next screening mammogram is recommended in one 
year.

A result letter of this screening mammogram will be mailed directly to the patient.

ASSESSMENT: Negative - BI-RADS 1

Screening mammogram in 1 year.
,

## 2013-01-15 ENCOUNTER — Other Ambulatory Visit: Payer: Self-pay

## 2013-01-15 DIAGNOSIS — Z1231 Encounter for screening mammogram for malignant neoplasm of breast: Secondary | ICD-10-CM

## 2013-02-01 ENCOUNTER — Ambulatory Visit
Admission: RE | Admit: 2013-02-01 | Discharge: 2013-02-01 | Disposition: A | Discharge status: Home / Self Care | Payer: BC Managed Care – PPO | Source: Ambulatory Visit

## 2013-02-01 DIAGNOSIS — Z1231 Encounter for screening mammogram for malignant neoplasm of breast: Secondary | ICD-10-CM

## 2013-08-10 ENCOUNTER — Other Ambulatory Visit: Payer: Self-pay | Admitting: Gastroenterology

## 2013-08-30 ENCOUNTER — Encounter (HOSPITAL_COMMUNITY): Payer: Self-pay | Admitting: *Deleted

## 2013-08-30 ENCOUNTER — Encounter (HOSPITAL_COMMUNITY): Payer: Self-pay | Admitting: Pharmacy Technician

## 2013-09-17 ENCOUNTER — Ambulatory Visit (HOSPITAL_COMMUNITY)
Admission: RE | Admit: 2013-09-17 | Payer: BC Managed Care – PPO | Source: Ambulatory Visit | Admitting: Gastroenterology

## 2013-09-17 HISTORY — DX: Essential (primary) hypertension: I10

## 2013-09-17 SURGERY — COLONOSCOPY
Anesthesia: Monitor Anesthesia Care

## 2013-12-21 ENCOUNTER — Other Ambulatory Visit: Payer: Self-pay | Admitting: Gastroenterology

## 2014-01-21 ENCOUNTER — Encounter (HOSPITAL_COMMUNITY): Payer: Self-pay | Admitting: *Deleted

## 2014-01-21 ENCOUNTER — Ambulatory Visit (HOSPITAL_COMMUNITY)
Admission: RE | Admit: 2014-01-21 | Discharge: 2014-01-21 | Disposition: A | Discharge status: Home / Self Care | Payer: BC Managed Care – PPO | Source: Ambulatory Visit | Attending: Gastroenterology | Admitting: Gastroenterology

## 2014-01-21 ENCOUNTER — Encounter (HOSPITAL_COMMUNITY)
Admission: RE | Disposition: A | Discharge status: Home / Self Care | Payer: Self-pay | Source: Ambulatory Visit | Attending: Gastroenterology

## 2014-01-21 DIAGNOSIS — D126 Benign neoplasm of colon, unspecified: Secondary | ICD-10-CM | POA: Insufficient documentation

## 2014-01-21 DIAGNOSIS — Z1211 Encounter for screening for malignant neoplasm of colon: Secondary | ICD-10-CM | POA: Insufficient documentation

## 2014-01-21 DIAGNOSIS — K5730 Diverticulosis of large intestine without perforation or abscess without bleeding: Secondary | ICD-10-CM | POA: Insufficient documentation

## 2014-01-21 DIAGNOSIS — I10 Essential (primary) hypertension: Secondary | ICD-10-CM | POA: Insufficient documentation

## 2014-01-21 DIAGNOSIS — Z800 Family history of malignant neoplasm of digestive organs: Secondary | ICD-10-CM | POA: Insufficient documentation

## 2014-01-21 HISTORY — PX: COLONOSCOPY: SHX5424

## 2014-01-21 SURGERY — COLONOSCOPY
Anesthesia: Moderate Sedation

## 2014-01-21 MED ORDER — MIDAZOLAM HCL 5 MG/5ML IJ SOLN
INTRAMUSCULAR | Status: DC | PRN
  Administered 2014-01-21: 2 mg via INTRAVENOUS
  Administered 2014-01-21 (×2): 1 mg via INTRAVENOUS
  Administered 2014-01-21 (×3): 2 mg via INTRAVENOUS

## 2014-01-21 MED ORDER — MIDAZOLAM HCL 10 MG/2ML IJ SOLN
INTRAMUSCULAR | Status: AC
  Filled 2014-01-21: qty 2

## 2014-01-21 MED ORDER — SODIUM CHLORIDE 0.9 % IV SOLN
INTRAVENOUS | Status: DC
  Administered 2014-01-21 (×2): 500 mL via INTRAVENOUS

## 2014-01-21 MED ORDER — FENTANYL CITRATE 0.05 MG/ML IJ SOLN
INTRAMUSCULAR | Status: AC
  Filled 2014-01-21: qty 4

## 2014-01-21 MED ORDER — FENTANYL CITRATE 0.05 MG/ML IJ SOLN
INTRAMUSCULAR | Status: DC | PRN
  Administered 2014-01-21 (×4): 25 ug via INTRAVENOUS

## 2014-01-21 NOTE — H&P (Signed)
   Debbie Barry HPI: This is a 63 year old female here for a screening colonoscopy.  She also has a family history of colon cancer.  Past Medical History  Diagnosis Date  . Hypertension     Past Surgical History  Procedure Laterality Date  . Bunionectony left 2006       August  . Bunionectomy right 2006      Right    History reviewed. No pertinent family history.  Social History:  reports that she has never smoked. She has never used smokeless tobacco. She reports that she does not drink alcohol or use illicit drugs.  Allergies: No Known Allergies  Medications:  Scheduled:  Continuous: . sodium chloride 500 mL (01/21/14 1009)    No results found for this or any previous visit (from the past 24 hour(s)).   No results found.  ROS:  As stated above in the HPI otherwise negative.  Blood pressure 196/126, temperature 98.1 F (36.7 C), temperature source Oral, resp. rate 12, height 5\' 2"  (1.575 m), weight 180 lb (81.647 kg), SpO2 100.00%.    PE: Gen: NAD, Alert and Oriented HEENT:  [place]/AT, EOMI Neck: Supple, no LAD Lungs: CTA Bilaterally CV: RRR without M/G/R ABM: Soft, NTND, +BS Ext: No C/C/E  Assessment/Plan: 1) Screening colonoscopy today.  Debbie Barry D 01/21/2014, 10:26 AM

## 2014-01-21 NOTE — Op Note (Signed)
[facility] [place], [id number]   OPERATIVE PROCEDURE REPORT  PATIENT: Debbie Barry, Debbie Barry  MR#: [id number] BIRTHDATE: 06-13-1951  GENDER: Female ENDOSCOPIST: Carol Ada, MD ASSISTANT:   Tedra Coupe, Cleda Daub, RN CGRN PROCEDURE DATE: 01/21/2014 PROCEDURE:   Colonoscopy with snare polypectomy ASA CLASS:   Class III INDICATIONS:Screening. MEDICATIONS: Versed 10 mg IV and Fentanyl 100 mcg IV  DESCRIPTION OF PROCEDURE:   After the risks benefits and alternatives of the procedure were thoroughly explained, informed consent was obtained.  A digital rectal exam revealed no abnormalities of the rectum.    The Pentax Colonoscope [id number] endoscope was introduced through the anus  and advanced to the cecum, which was identified by both the appendix and ileocecal valve , No adverse events experienced.    The quality of the prep was good. .  The instrument was then slowly withdrawn as the colon was fully examined.     FINDINGS: A 4 mm sessile polyp was removed from the transverse colon.  Small diverticula were scattered throughout the colon.  No evidence of any masses, inflammation, ulcerations, erosions, or vascular abnormalities.   Retroflexed views revealed no abnormalities.     The scope was then withdrawn from the patient and the procedure terminated.  COMPLICATIONS: There were no complications.  IMPRESSION: 1) Polyp. 2) Diverticula.  RECOMMENDATIONS: 1) Await biospy results. 2) Repeat the colonoscopy in 5 years.  _______________________________ eSignedCarol Ada, MD 01/21/2014 11:26 AM

## 2014-01-21 NOTE — Discharge Instructions (Signed)

## 2014-01-24 ENCOUNTER — Encounter (HOSPITAL_COMMUNITY): Payer: Self-pay | Admitting: Gastroenterology

## 2014-04-19 ENCOUNTER — Other Ambulatory Visit: Payer: Self-pay

## 2014-04-19 DIAGNOSIS — Z1231 Encounter for screening mammogram for malignant neoplasm of breast: Secondary | ICD-10-CM

## 2014-05-09 ENCOUNTER — Ambulatory Visit
Admission: RE | Admit: 2014-05-09 | Discharge: 2014-05-09 | Disposition: A | Discharge status: Home / Self Care | Payer: BC Managed Care – PPO | Source: Ambulatory Visit

## 2014-05-09 DIAGNOSIS — Z1231 Encounter for screening mammogram for malignant neoplasm of breast: Secondary | ICD-10-CM

## 2014-05-11 ENCOUNTER — Other Ambulatory Visit: Payer: Self-pay | Admitting: Nurse Practitioner

## 2014-05-11 DIAGNOSIS — R928 Other abnormal and inconclusive findings on diagnostic imaging of breast: Secondary | ICD-10-CM

## 2014-06-06 ENCOUNTER — Ambulatory Visit
Admission: RE | Admit: 2014-06-06 | Discharge: 2014-06-06 | Disposition: A | Discharge status: Home / Self Care | Payer: BC Managed Care – PPO | Source: Ambulatory Visit | Attending: Nurse Practitioner | Admitting: Nurse Practitioner

## 2014-06-06 DIAGNOSIS — R928 Other abnormal and inconclusive findings on diagnostic imaging of breast: Secondary | ICD-10-CM

## 2015-05-08 ENCOUNTER — Other Ambulatory Visit: Payer: Self-pay

## 2015-05-08 DIAGNOSIS — Z1231 Encounter for screening mammogram for malignant neoplasm of breast: Secondary | ICD-10-CM

## 2015-06-05 ENCOUNTER — Ambulatory Visit
Admission: RE | Admit: 2015-06-05 | Discharge: 2015-06-05 | Disposition: A | Discharge status: Home / Self Care | Payer: BLUE CROSS/BLUE SHIELD | Source: Ambulatory Visit

## 2015-06-05 DIAGNOSIS — Z1231 Encounter for screening mammogram for malignant neoplasm of breast: Secondary | ICD-10-CM

## 2015-10-19 ENCOUNTER — Other Ambulatory Visit: Payer: Self-pay | Admitting: Internal Medicine

## 2015-10-19 DIAGNOSIS — R945 Abnormal results of liver function studies: Secondary | ICD-10-CM

## 2015-10-27 ENCOUNTER — Ambulatory Visit
Admission: RE | Admit: 2015-10-27 | Discharge: 2015-10-27 | Disposition: A | Discharge status: Home / Self Care | Payer: BLUE CROSS/BLUE SHIELD | Source: Ambulatory Visit | Attending: Internal Medicine | Admitting: Internal Medicine

## 2015-10-27 DIAGNOSIS — R945 Abnormal results of liver function studies: Secondary | ICD-10-CM

## 2015-10-27 IMAGING — US US ABDOMEN LIMITED
1 series · 14 of 25 positions shown · non-contrast
Comparison: No recent prior.

CLINICAL DATA: Abnormal LFTs.

EXAM:
US ABDOMEN LIMITED - RIGHT UPPER QUADRANT

[Series 1: us abdomen limited · 0.37mm/px · 14 of 38 slices shown]
[im 1/38]
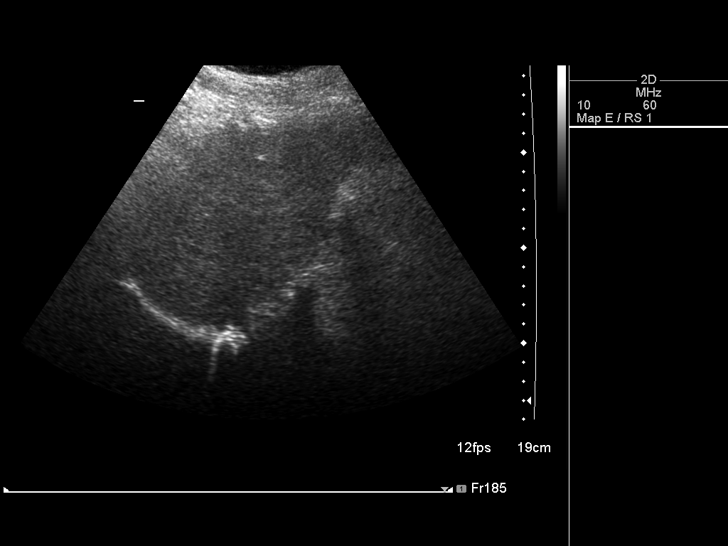
[im 4/38]
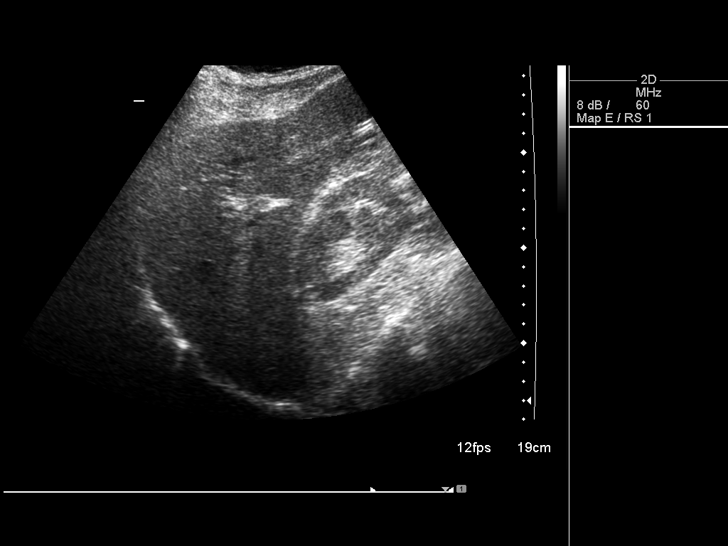
[im 7/38]
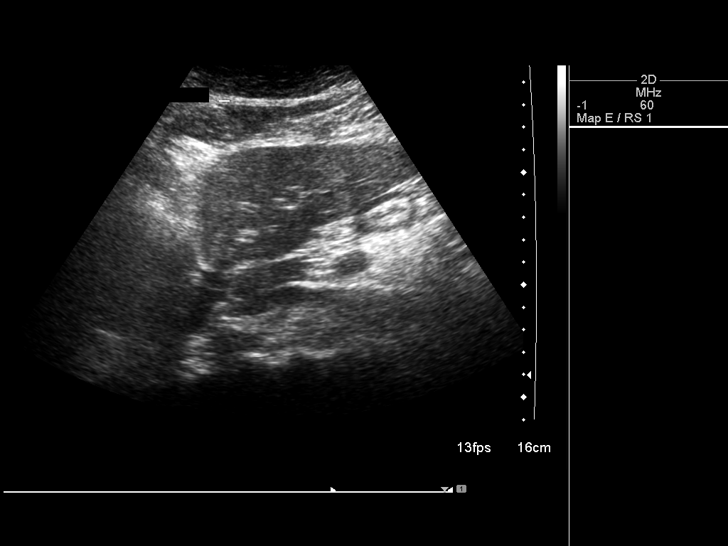
[im 10/38]
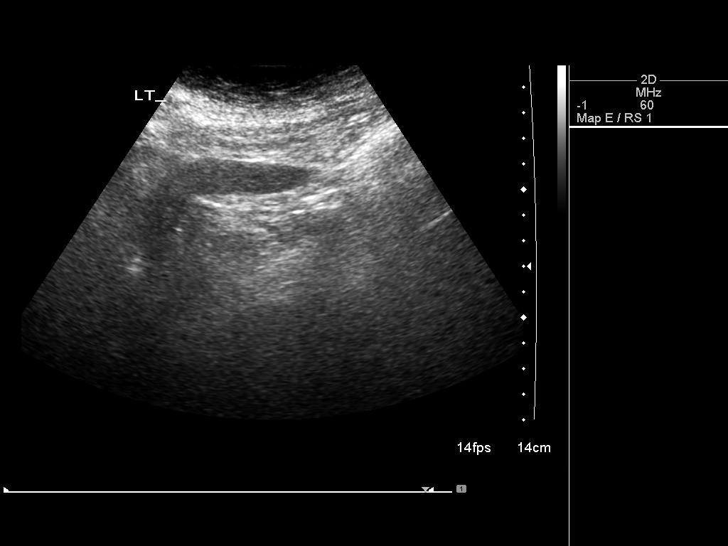
[im 13/38]
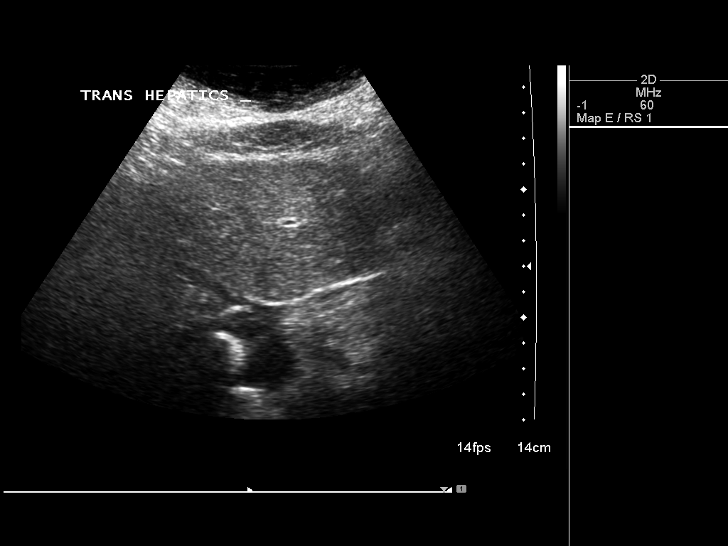
[im 14/38]
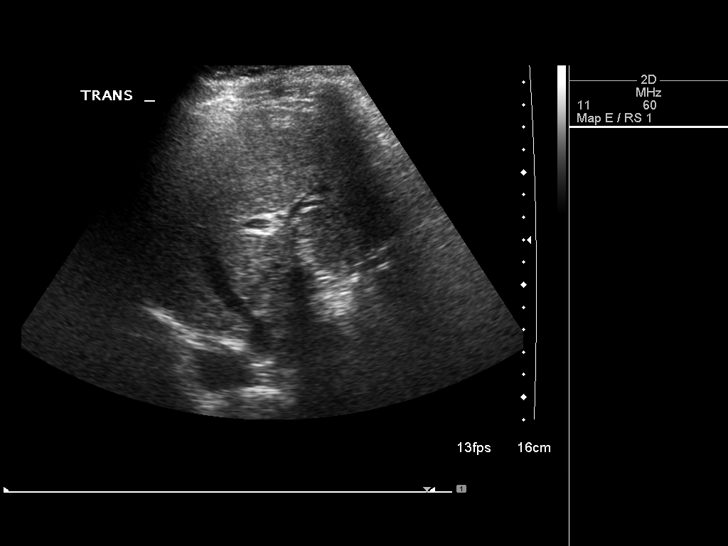
[im 17/38]
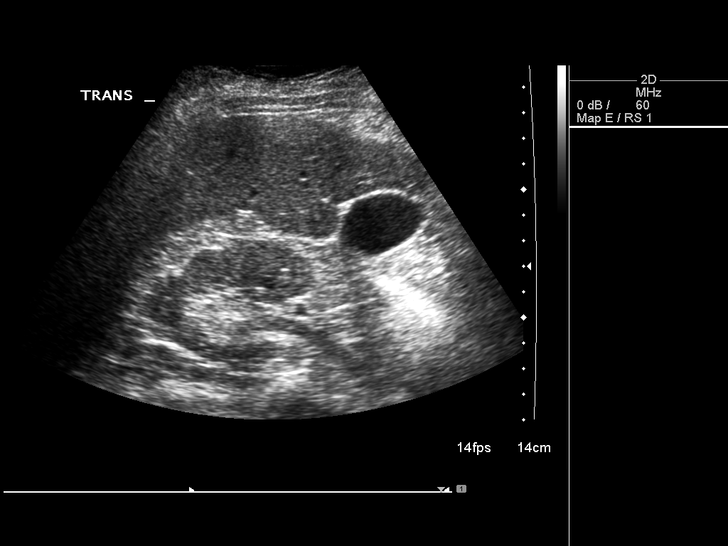
[im 21/38]
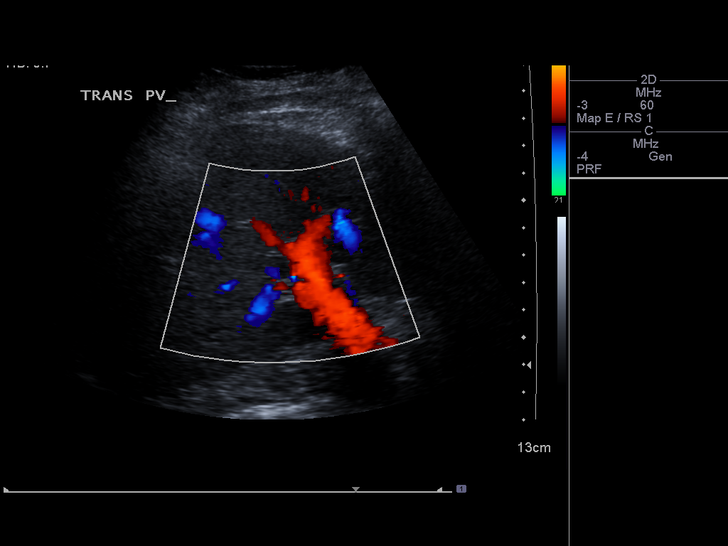
[im 24/38]
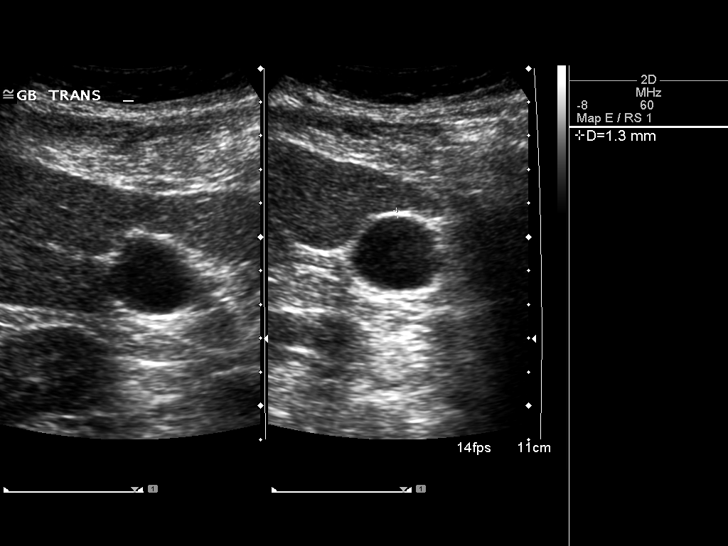
[im 25/38]
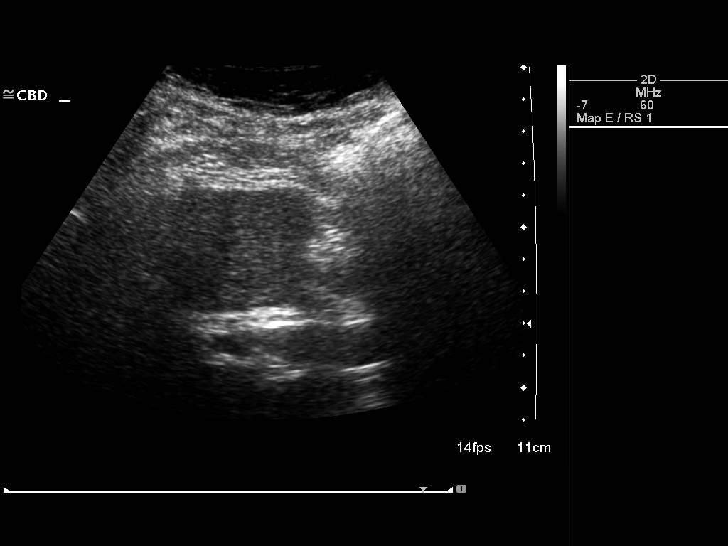
[im 28/38]
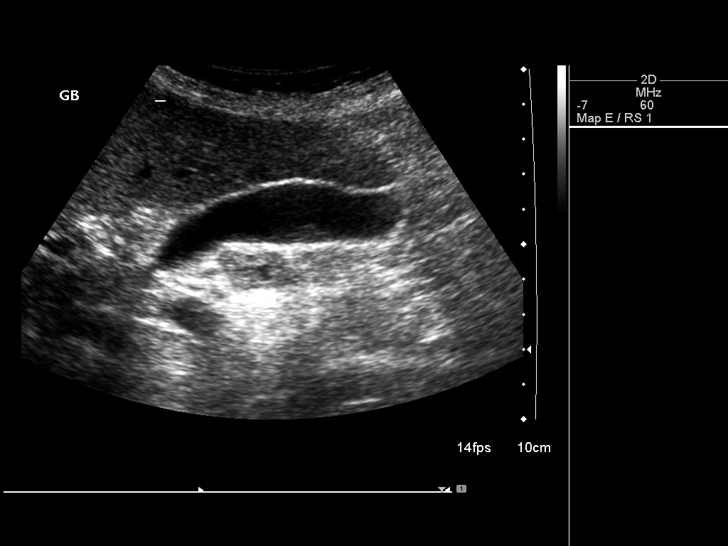
[im 31/38]
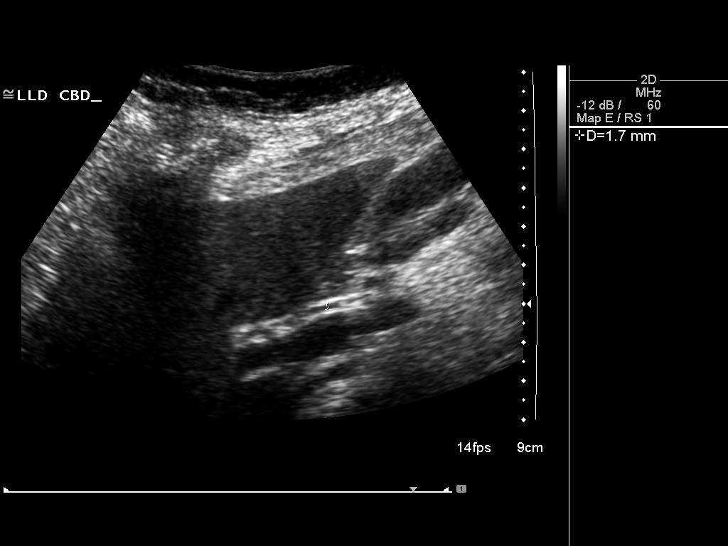
[im 34/38]
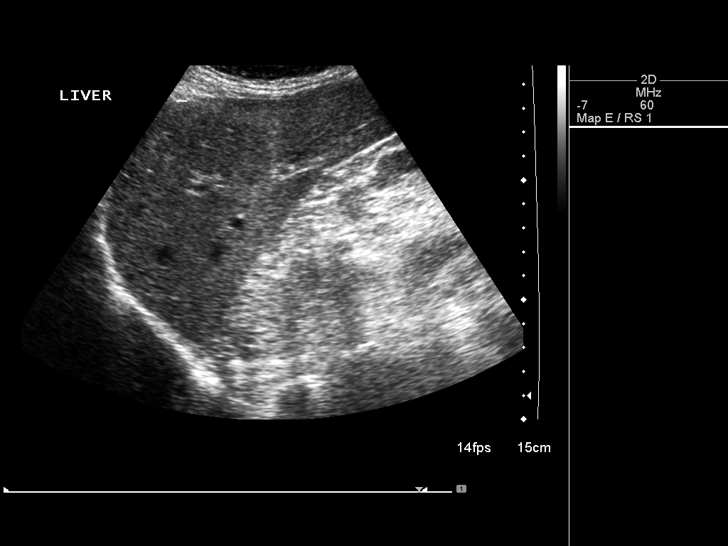
[im 38/38]
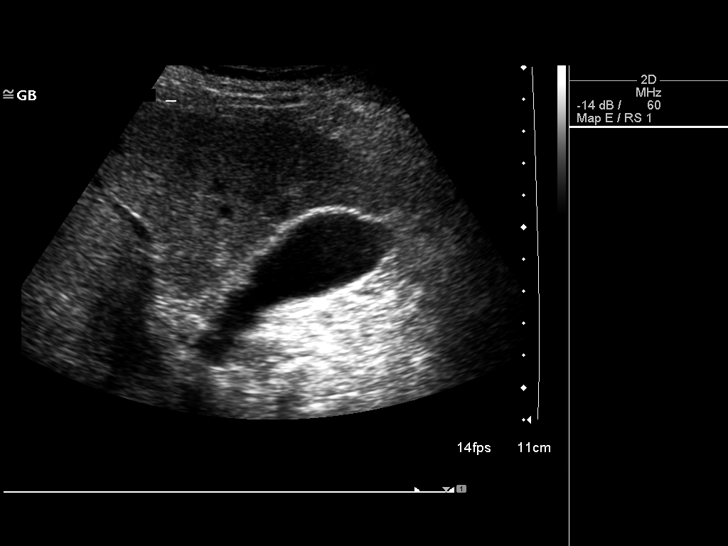

[14 of 25 positions shown; findings below may reference images not displayed]

FINDINGS: Gallbladder:

No gallstones or wall thickening visualized. No sonographic Murphy
sign noted by sonographer.

Common bile duct:

Diameter: 2.0 mm

Liver:

No focal lesion identified. Within normal limits in parenchymal
echogenicity.
IMPRESSION: Negative exam.

## 2016-07-12 ENCOUNTER — Other Ambulatory Visit: Payer: Self-pay | Admitting: Internal Medicine

## 2016-07-12 DIAGNOSIS — Z1231 Encounter for screening mammogram for malignant neoplasm of breast: Secondary | ICD-10-CM

## 2016-08-05 ENCOUNTER — Ambulatory Visit
Admission: RE | Admit: 2016-08-05 | Discharge: 2016-08-05 | Disposition: A | Discharge status: Home / Self Care | Payer: BLUE CROSS/BLUE SHIELD | Source: Ambulatory Visit | Attending: Internal Medicine | Admitting: Internal Medicine

## 2016-08-05 DIAGNOSIS — Z1231 Encounter for screening mammogram for malignant neoplasm of breast: Secondary | ICD-10-CM

## 2016-11-18 ENCOUNTER — Ambulatory Visit (HOSPITAL_COMMUNITY)
Admission: EM | Admit: 2016-11-18 | Discharge: 2016-11-18 | Disposition: A | Discharge status: Home / Self Care | Payer: BLUE CROSS/BLUE SHIELD | Attending: Family Medicine | Admitting: Family Medicine

## 2016-11-18 ENCOUNTER — Encounter (HOSPITAL_COMMUNITY): Payer: Self-pay | Admitting: Emergency Medicine

## 2016-11-18 DIAGNOSIS — R0982 Postnasal drip: Secondary | ICD-10-CM | POA: Diagnosis not present

## 2016-11-18 DIAGNOSIS — J9801 Acute bronchospasm: Secondary | ICD-10-CM

## 2016-11-18 DIAGNOSIS — J301 Allergic rhinitis due to pollen: Secondary | ICD-10-CM

## 2016-11-18 DIAGNOSIS — R05 Cough: Secondary | ICD-10-CM

## 2016-11-18 MED ORDER — PREDNISONE 50 MG PO TABS: ORAL_TABLET | ORAL | 0 refills | Status: DC

## 2016-11-18 MED ORDER — IPRATROPIUM-ALBUTEROL 0.5-2.5 (3) MG/3ML IN SOLN
RESPIRATORY_TRACT | Status: AC
  Filled 2016-11-18: qty 3

## 2016-11-18 MED ORDER — IPRATROPIUM-ALBUTEROL 0.5-2.5 (3) MG/3ML IN SOLN
3.0000 mL | Freq: Once | RESPIRATORY_TRACT | Status: AC
  Administered 2016-11-18: 3 mL via RESPIRATORY_TRACT

## 2016-11-18 MED ORDER — PREDNISONE 20 MG PO TABS
ORAL_TABLET | ORAL | Status: AC
  Filled 2016-11-18: qty 3

## 2016-11-18 MED ORDER — PREDNISONE 20 MG PO TABS
60.0000 mg | ORAL_TABLET | Freq: Once | ORAL | Status: AC
  Administered 2016-11-18: 60 mg via ORAL

## 2016-11-18 MED ORDER — ALBUTEROL SULFATE HFA 108 (90 BASE) MCG/ACT IN AERS: 2.0000 | INHALATION_SPRAY | RESPIRATORY_TRACT | 0 refills | Status: DC | PRN

## 2016-11-18 NOTE — ED Triage Notes (Signed)
Onset of sore throat and dizziness and weakness on Friday.  Now patient has runny nose, cough and sore throat.

## 2016-11-18 NOTE — Discharge Instructions (Signed)
It is likely you are having an allergy attack. This is also causing wheezing which produces uncontrollable cough sometimes. Use the albuterol HFA inhaler 2 puffs every 4 hours as needed for cough and wheeze or shortness of breath. Start taking the prednisone tomorrow. The sure to take it with food. Below his a list of medications he may choose to help with your other symptoms. Primarily you will be taking one of the antihistamines to help with drainage, runny nose and allergies. Drink plenty of fluids and stay well-hydrated. If you develop fevers, vomiting or worsening seek medical attention promptly. Sudafed PE 10 mg every 4 to 6 hours as needed for congestion Allegra or Zyrtec daily as needed for drainage and runny nose. For stronger antihistamine may take Chlor-Trimeton 2 to 4 mg every 4 to 6 hours, may cause drowsiness. Saline nasal spray used frequently. Ibuprofen 600 mg every 6 hours as needed for pain, discomfort or fever. Drink plenty of fluids and stay well-hydrated. Flonase or Rhinocort nasal spray daily

## 2016-11-18 NOTE — ED Provider Notes (Signed)
CSN: [id number]     Arrival date & time 11/18/16  1143 History   First MD Initiated Contact with Patient 11/18/16 1306     Chief Complaint  Patient presents with  . URI   (Consider location/radiation/quality/duration/timing/severity/associated sxs/prior Treatment) 66 year old female states that about 4 days ago she developed some light dizziness, sore throat, chills and a cough. She also has occasional runny nose and PND. Denies documented fever.      Past Medical History:  Diagnosis Date  . Hypertension    Past Surgical History:  Procedure Laterality Date  . Bunionectomy Right 2006     Right  . Bunionectony Left 2006      August  . COLONOSCOPY N/A 01/21/2014   Procedure: COLONOSCOPY;  Surgeon: Beryle Beams, MD;  Location: [facility];  Service: Endoscopy;  Laterality: N/A;   Family History  Problem Relation Age of Onset  . Breast cancer Mother    Social History  Substance Use Topics  . Smoking status: Never Smoker  . Smokeless tobacco: Never Used  . Alcohol use No     Comment: social   OB History    No data available     Review of Systems  Constitutional: Positive for activity change. Negative for appetite change, chills, fatigue and fever.  HENT: Positive for congestion, postnasal drip, rhinorrhea and sore throat. Negative for ear pain and facial swelling.   Eyes: Negative.   Respiratory: Positive for cough. Negative for shortness of breath.   Cardiovascular: Negative.   Musculoskeletal: Negative for neck pain and neck stiffness.  Skin: Negative for pallor and rash.  Neurological: Negative.   All other systems reviewed and are negative.   Allergies  Patient has no known allergies.  Home Medications   Prior to Admission medications   Medication Sig Start Date End Date Taking? Authorizing Provider  cilostazol (PLETAL) 100 MG tablet Take 100 mg by mouth 2 (two) times daily.   Yes [provider]  niacin 100 MG tablet Take 1,000 mg by mouth at  bedtime.   Yes [provider]  OVER THE COUNTER MEDICATION "cilastazol 100mg    Yes [provider]  sertraline (ZOLOFT) 25 MG tablet Take 25 mg by mouth daily.   Yes [provider]  valsartan (DIOVAN) 160 MG tablet Take 160 mg by mouth daily.   Yes [provider]  amLODipine (NORVASC) 5 MG tablet Take 5 mg by mouth every morning.    [provider]  aspirin EC 325 MG tablet Take 325 mg by mouth daily.    [provider]  carvedilol (COREG) 3.125 MG tablet Take 3.125 mg by mouth 2 (two) times daily.    [provider]  Multiple Vitamin (MULTIVITAMIN WITH MINERALS) TABS tablet Take 1 tablet by mouth daily.    [provider]  Tetrahydrozoline HCl (VISINE OP) Apply 1 drop to eye as needed (dry/irritated eyes).    [provider]   Meds Ordered and Administered this Visit   Medications  ipratropium-albuterol (DUONEB) 0.5-2.5 (3) MG/3ML nebulizer solution 3 mL (3 mLs Nebulization Given 11/18/16 1332)  predniSONE (DELTASONE) tablet 60 mg (60 mg Oral Given 11/18/16 1331)    Pulse 82   Temp 98.8 F (37.1 C) (Oral)   Resp 16   SpO2 97%  No data found.   Physical Exam  Constitutional: She is oriented to person, place, and time. She appears well-developed and well-nourished. No distress.  HENT:  Head: Normocephalic and atraumatic.  Right Ear: External ear  normal.  Left Ear: External ear normal.  Mouth/Throat: No oropharyngeal exudate.  Right TM is retracted with mild injection. No effusion. Left TM is normal. Oropharynx with moderate amount of clear thick PND, minor erythema.  Eyes: EOM are normal. Pupils are equal, round, and reactive to light.  Neck: Normal range of motion. Neck supple.  Cardiovascular: Normal rate, regular rhythm, normal heart sounds and intact distal pulses.   Pulmonary/Chest: Effort normal. No respiratory distress. She has wheezes.  Bilateral diffuse expiratory wheezing with prolonged  expiratory phase.  Musculoskeletal: Normal range of motion. She exhibits no edema.  Lymphadenopathy:    She has no cervical adenopathy.  Neurological: She is alert and oriented to person, place, and time.  Skin: Skin is warm and dry.  Psychiatric: She has a normal mood and affect.  Nursing note and vitals reviewed.   Urgent Care Course     Procedures (including critical care time)  Labs Review Labs Reviewed - No data to display  Imaging Review No results found.   Visual Acuity Review  Right Eye Distance:   Left Eye Distance:   Bilateral Distance:    Right Eye Near:   Left Eye Near:    Bilateral Near:         MDM   1. Seasonal allergic rhinitis due to pollen   2. PND (post-nasal drip)   3. Cough due to bronchospasm   Post DuoNeb there is significant improvement in air movement and decrease in wheeze. Patient states she feels as though she is breathing better. It is likely you are having an allergy attack. This is also causing wheezing which produces uncontrollable cough sometimes. Use the albuterol HFA inhaler 2 puffs every 4 hours as needed for cough and wheeze or shortness of breath. Start taking the prednisone tomorrow. The sure to take it with food. Below his a list of medications he may choose to help with your other symptoms. Primarily you will be taking one of the antihistamines to help with drainage, runny nose and allergies. Drink plenty of fluids and stay well-hydrated. If you develop fevers, vomiting or worsening seek medical attention promptly. Sudafed PE 10 mg every 4 to 6 hours as needed for congestion Allegra or Zyrtec daily as needed for drainage and runny nose. For stronger antihistamine may take Chlor-Trimeton 2 to 4 mg every 4 to 6 hours, may cause drowsiness. Saline nasal spray used frequently. Ibuprofen 600 mg every 6 hours as needed for pain, discomfort or fever. Meds ordered this encounter  Medications  . sertraline (ZOLOFT) 25 MG tablet     Sig: Take 25 mg by mouth daily.  . valsartan (DIOVAN) 160 MG tablet    Sig: Take 160 mg by mouth daily.  . niacin 100 MG tablet    Sig: Take 1,000 mg by mouth at bedtime.  Marland Kitchen OVER THE COUNTER MEDICATION    Sig: "cilastazol 100mg   . cilostazol (PLETAL) 100 MG tablet    Sig: Take 100 mg by mouth 2 (two) times daily.  Marland Kitchen ipratropium-albuterol (DUONEB) 0.5-2.5 (3) MG/3ML nebulizer solution 3 mL  . predniSONE (DELTASONE) tablet 60 mg  . albuterol (PROVENTIL HFA;VENTOLIN HFA) 108 (90 Base) MCG/ACT inhaler    Sig: Inhale 2 puffs into the lungs every 4 (four) hours as needed for wheezing or shortness of breath.    Dispense:  1 Inhaler    Refill:  0    Order Specific Question:   Supervising Provider    Answer:   Robyn Haber [[id number]]  .  predniSONE (DELTASONE) 50 MG tablet    Sig: 1 tab po daily for 6 days. Take with food.    Dispense:  6 tablet    Refill:  0    Order Specific Question:   Supervising Provider    Answer:   Robyn Haber [[id number]]    Drink plenty of fluids and stay well-hydrated. Flonase or Rhinocort nasal spray daily    Janne Napoleon, NP 11/18/16 1416

## 2017-05-14 ENCOUNTER — Encounter (HOSPITAL_COMMUNITY): Payer: Self-pay | Admitting: Emergency Medicine

## 2017-05-14 ENCOUNTER — Other Ambulatory Visit: Payer: Self-pay

## 2017-05-14 ENCOUNTER — Ambulatory Visit (HOSPITAL_COMMUNITY)
Admission: EM | Admit: 2017-05-14 | Discharge: 2017-05-14 | Disposition: A | Discharge status: Home / Self Care | Payer: BLUE CROSS/BLUE SHIELD | Attending: Family Medicine | Admitting: Family Medicine

## 2017-05-14 DIAGNOSIS — J209 Acute bronchitis, unspecified: Secondary | ICD-10-CM | POA: Diagnosis not present

## 2017-05-14 MED ORDER — HYDROCOD POLST-CPM POLST ER 10-8 MG/5ML PO SUER: 5.0000 mL | Freq: Two times a day (BID) | ORAL | 0 refills | Status: AC | PRN

## 2017-05-14 NOTE — ED Provider Notes (Signed)
[place]    CSN: [id number] Arrival date & time: 05/14/17  1138     History   Chief Complaint Chief Complaint  Patient presents with  . Cough    HPI Debbie Barry is a 65 y.o. female.   With hx of HTN, presenting today for cough.   The history is provided by the patient.  Cough  Cough characteristics: started dry but now is productive. Sputum characteristics:  Clear Onset quality:  Gradual Duration:  5 days Timing:  Intermittent Progression:  Worsening Chronicity:  New Smoker: no   Context: not occupational exposure, not sick contacts and not smoke exposure   Ineffective treatments: coricidin without relief.  Associated symptoms: myalgias and sinus congestion   Associated symptoms: no chest pain, no chills, no ear pain, no fever, no headaches, no rash, no shortness of breath, no sore throat, no weight loss and no wheezing     Past Medical History:  Diagnosis Date  . Hypertension     There are no active problems to display for this patient.   Past Surgical History:  Procedure Laterality Date  . Bunionectomy Right 2006     Right  . Bunionectony Left 2006      August    OB History    No data available       Home Medications    Prior to Admission medications   Medication Sig Start Date End Date Taking? Authorizing Provider  albuterol (PROVENTIL HFA;VENTOLIN HFA) 108 (90 Base) MCG/ACT inhaler Inhale 2 puffs into the lungs every 4 (four) hours as needed for wheezing or shortness of breath. 11/18/16  Yes Mabe, Shanon Brow, NP  aspirin EC 325 MG tablet Take 325 mg by mouth daily.   Yes [provider]  carvedilol (COREG) 3.125 MG tablet Take 3.125 mg by mouth 2 (two) times daily.   Yes [provider]  Multiple Vitamin (MULTIVITAMIN WITH MINERALS) TABS tablet Take 1 tablet by mouth daily.   Yes [provider]  niacin 100 MG tablet Take 1,000 mg by mouth at bedtime.   Yes [provider]  OVER THE COUNTER  MEDICATION "cilastazol 100mg    Yes [provider]  sertraline (ZOLOFT) 25 MG tablet Take 25 mg by mouth daily.   Yes [provider]  Tetrahydrozoline HCl (VISINE OP) Apply 1 drop to eye as needed (dry/irritated eyes).   Yes [provider]  amLODipine (NORVASC) 5 MG tablet Take 5 mg by mouth every morning.    [provider]  chlorpheniramine-HYDROcodone (TUSSIONEX PENNKINETIC ER) 10-8 MG/5ML SUER Take 5 mLs every 12 (twelve) hours as needed for up to 7 days by mouth for cough. 05/14/17 05/21/17  Barry Dienes, NP  cilostazol (PLETAL) 100 MG tablet Take 100 mg by mouth 2 (two) times daily.    [provider]  predniSONE (DELTASONE) 50 MG tablet 1 tab po daily for 6 days. Take with food. 11/18/16   Janne Napoleon, NP  valsartan (DIOVAN) 160 MG tablet Take 160 mg by mouth daily.    [provider]    Family History Family History  Problem Relation Age of Onset  . Breast cancer Mother     Social History Social History   Tobacco Use  . Smoking status: Never Smoker  . Smokeless tobacco: Never Used  Substance Use Topics  . Alcohol use: No    Comment: social  . Drug use: No     Allergies   Patient has no known allergies.  Review of Systems Review of Systems  Constitutional: Negative for chills, fever and weight loss.  HENT: Negative for ear pain and sore throat.   Respiratory: Positive for cough. Negative for shortness of breath and wheezing.   Cardiovascular: Negative for chest pain.  Musculoskeletal: Positive for myalgias.  Skin: Negative for rash.  Neurological: Negative for headaches.     Physical Exam Triage Vital Signs ED Triage Vitals  Enc Vitals Group     BP 05/14/17 1220 (!) 175/120     Pulse Rate 05/14/17 1216 81     Resp 05/14/17 1216 18     Temp 05/14/17 1216 98.7 F (37.1 C)     Temp src --      SpO2 05/14/17 1216 100 %     Weight --      Height --      Head Circumference --      Peak Flow --       Pain Score 05/14/17 1217 2     Pain Loc --      Pain Edu? --      Excl. in [place]? --    No data found.  Updated Vital Signs BP (!) 175/120   Pulse 81   Temp 98.7 F (37.1 C)   Resp 18   SpO2 100%   Physical Exam  Constitutional: She is oriented to person, place, and time. She appears well-developed and well-nourished. No distress.  HENT:  Head: Normocephalic and atraumatic.  Right Ear: External ear normal.  Left Ear: External ear normal.  Nose: Nose normal.  Mouth/Throat: Oropharynx is clear and moist. No oropharyngeal exudate.  Eyes: Conjunctivae are normal. Pupils are equal, round, and reactive to light.  Neck: Normal range of motion.  Cardiovascular: Normal rate, regular rhythm and normal heart sounds.  No murmur heard. Pulmonary/Chest: Effort normal and breath sounds normal. She has no wheezes.  Abdominal: Soft. Bowel sounds are normal. There is no tenderness.  Lymphadenopathy:    She has no cervical adenopathy.  Neurological: She is alert and oriented to person, place, and time.  Skin: Skin is warm. No rash noted. She is not diaphoretic.  Nursing note and vitals reviewed.    UC Treatments / Results  Labs (all labs ordered are listed, but only abnormal results are displayed) Labs Reviewed - No data to display  EKG  EKG Interpretation None       Radiology No results found.  Procedures Procedures (including critical care time)  Medications Ordered in UC Medications - No data to display   Initial Impression / Assessment and Plan / UC Course  I have reviewed the triage vital signs and the nursing notes.  Pertinent labs & imaging results that were available during my care of the patient were reviewed by me and considered in my medical decision making (see chart for details).  Final Clinical Impressions(s) / UC Diagnoses   Final diagnoses:  Acute bronchitis, unspecified organism   Prescriptions given (see below). Reviewed directions for usage and side  effects. Patient states understanding and will call with questions or problems. Patient instructed to call or follow up with his/her primary care doctor if failure to improve or change in symptoms. Discharge instruction given.  ED Discharge Orders        Ordered    chlorpheniramine-HYDROcodone (TUSSIONEX [facility] ER) 10-8 MG/5ML SUER  Every 12 hours PRN     05/14/17 1315     Controlled Substance Prescriptions [place] Controlled Substance Registry consulted? Not Applicable  Barry Dienes, NP 05/14/17 1319

## 2017-05-14 NOTE — ED Triage Notes (Signed)
Pt c/o dry cough, eye drainage. Scratchy throat.

## 2017-07-29 ENCOUNTER — Encounter (HOSPITAL_COMMUNITY): Payer: Self-pay | Admitting: Emergency Medicine

## 2017-07-29 ENCOUNTER — Emergency Department (HOSPITAL_COMMUNITY): Payer: No Typology Code available for payment source

## 2017-07-29 ENCOUNTER — Ambulatory Visit (HOSPITAL_COMMUNITY)
Admission: EM | Admit: 2017-07-29 | Discharge: 2017-07-29 | Disposition: A | Discharge status: Home / Self Care | Payer: No Typology Code available for payment source | Source: Home / Self Care | Attending: Family Medicine | Admitting: Family Medicine

## 2017-07-29 ENCOUNTER — Emergency Department (HOSPITAL_COMMUNITY)
Admission: EM | Admit: 2017-07-29 | Discharge: 2017-07-29 | Disposition: A | Discharge status: Home / Self Care | Payer: No Typology Code available for payment source | Attending: Emergency Medicine | Admitting: Emergency Medicine

## 2017-07-29 ENCOUNTER — Other Ambulatory Visit: Payer: Self-pay

## 2017-07-29 ENCOUNTER — Encounter (HOSPITAL_COMMUNITY): Payer: Self-pay | Admitting: *Deleted

## 2017-07-29 DIAGNOSIS — Z7982 Long term (current) use of aspirin: Secondary | ICD-10-CM | POA: Insufficient documentation

## 2017-07-29 DIAGNOSIS — Z79899 Other long term (current) drug therapy: Secondary | ICD-10-CM | POA: Diagnosis not present

## 2017-07-29 DIAGNOSIS — M869 Osteomyelitis, unspecified: Secondary | ICD-10-CM | POA: Diagnosis not present

## 2017-07-29 DIAGNOSIS — I10 Essential (primary) hypertension: Secondary | ICD-10-CM | POA: Diagnosis not present

## 2017-07-29 DIAGNOSIS — L03031 Cellulitis of right toe: Secondary | ICD-10-CM | POA: Diagnosis present

## 2017-07-29 DIAGNOSIS — L089 Local infection of the skin and subcutaneous tissue, unspecified: Secondary | ICD-10-CM

## 2017-07-29 LAB — CBC WITH DIFFERENTIAL/PLATELET
Basophils Absolute: 0 10*3/uL (ref 0.0–0.1)
Basophils Relative: 0 %
Eosinophils Absolute: 0.1 10*3/uL (ref 0.0–0.7)
Eosinophils Relative: 1 %
HCT: 48.3 % — ABNORMAL HIGH (ref 36.0–46.0)
Hemoglobin: 16.3 g/dL — ABNORMAL HIGH (ref 12.0–15.0)
Lymphocytes Relative: 17 %
Lymphs Abs: 1.5 10*3/uL (ref 0.7–4.0)
MCH: 35.8 pg — ABNORMAL HIGH (ref 26.0–34.0)
MCHC: 33.7 g/dL (ref 30.0–36.0)
MCV: 106.2 fL — ABNORMAL HIGH (ref 78.0–100.0)
Monocytes Absolute: 0.7 10*3/uL (ref 0.1–1.0)
Monocytes Relative: 8 %
Neutro Abs: 6.5 10*3/uL (ref 1.7–7.7)
Neutrophils Relative %: 74 %
Platelets: 215 10*3/uL (ref 150–400)
RBC: 4.55 MIL/uL (ref 3.87–5.11)
RDW: 14.4 % (ref 11.5–15.5)
WBC: 8.8 10*3/uL (ref 4.0–10.5)

## 2017-07-29 LAB — BASIC METABOLIC PANEL
Anion gap: 12 (ref 5–15)
BUN: 6 mg/dL (ref 6–20)
CO2: 26 mmol/L (ref 22–32)
Calcium: 8.4 mg/dL — ABNORMAL LOW (ref 8.9–10.3)
Chloride: 100 mmol/L — ABNORMAL LOW (ref 101–111)
Creatinine, Ser: 0.65 mg/dL (ref 0.44–1.00)
GFR calc Af Amer: >60 mL/min (ref >60)
GFR calc non Af Amer: >60 mL/min (ref >60)
Glucose, Bld: 104 mg/dL — ABNORMAL HIGH (ref 65–99)
Potassium: 3.4 mmol/L — ABNORMAL LOW (ref 3.5–5.1)
Sodium: 138 mmol/L (ref 135–145)

## 2017-07-29 IMAGING — DX DG FOOT COMPLETE 3+V*R*
3 series · 3 of 3 positions shown · non-contrast
Comparison: None.

CLINICAL DATA: Right foot pain and swelling with signs of
infection, initial encounter

EXAM:
RIGHT FOOT COMPLETE - 3+ VIEW

[x foot ap right]
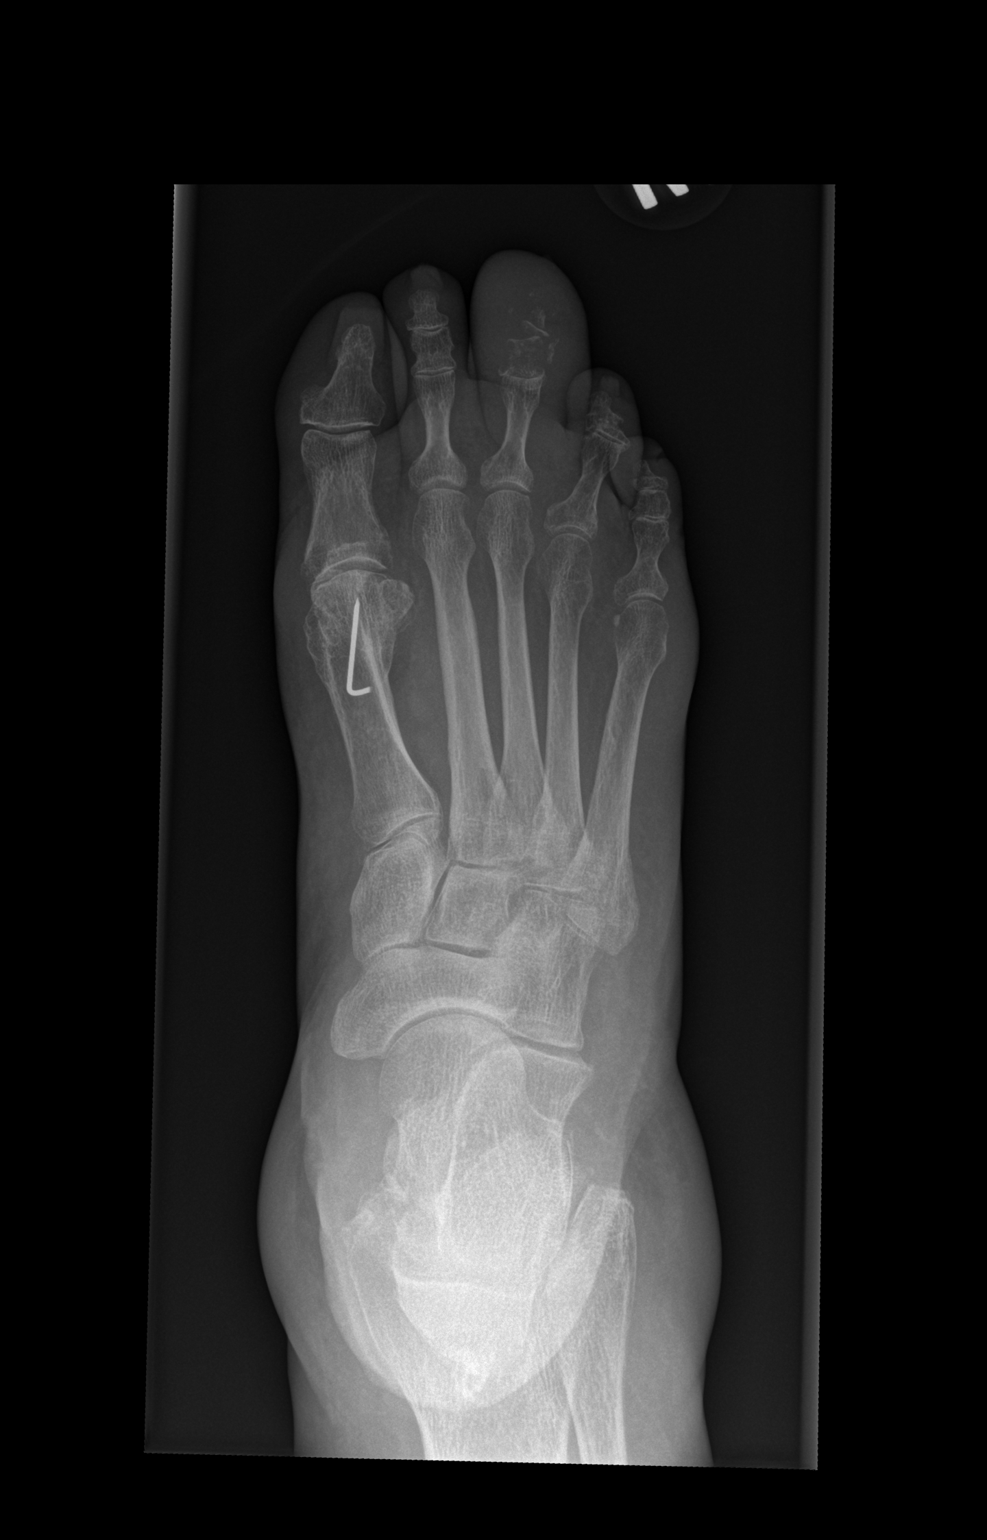

[x foot obl right]
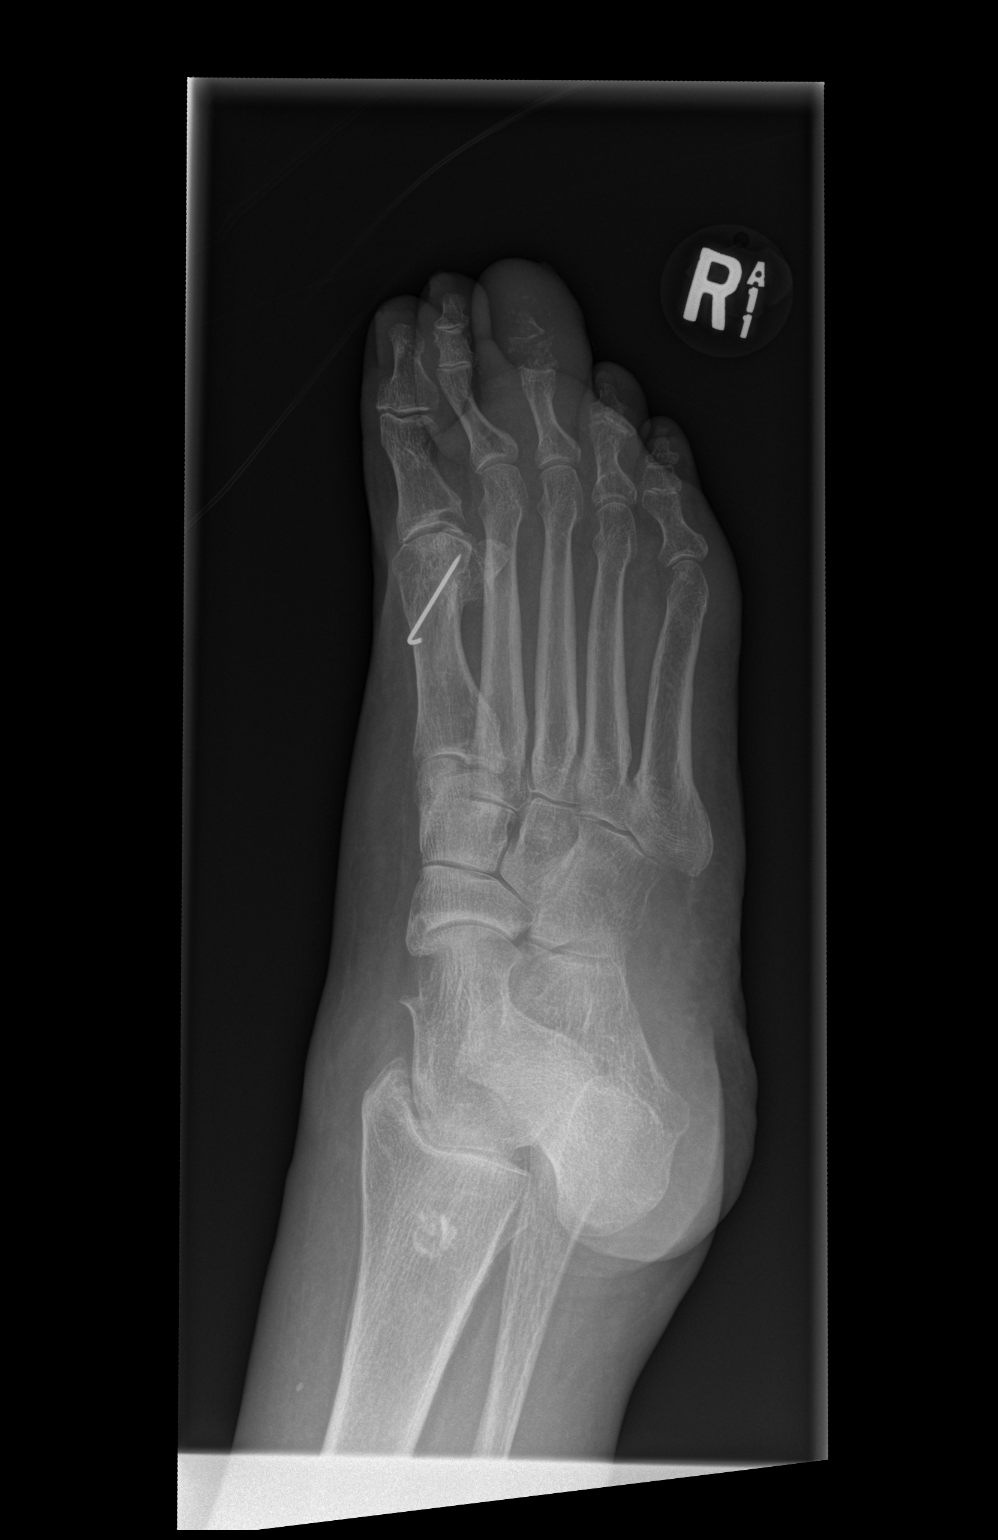

[x foot lat right]
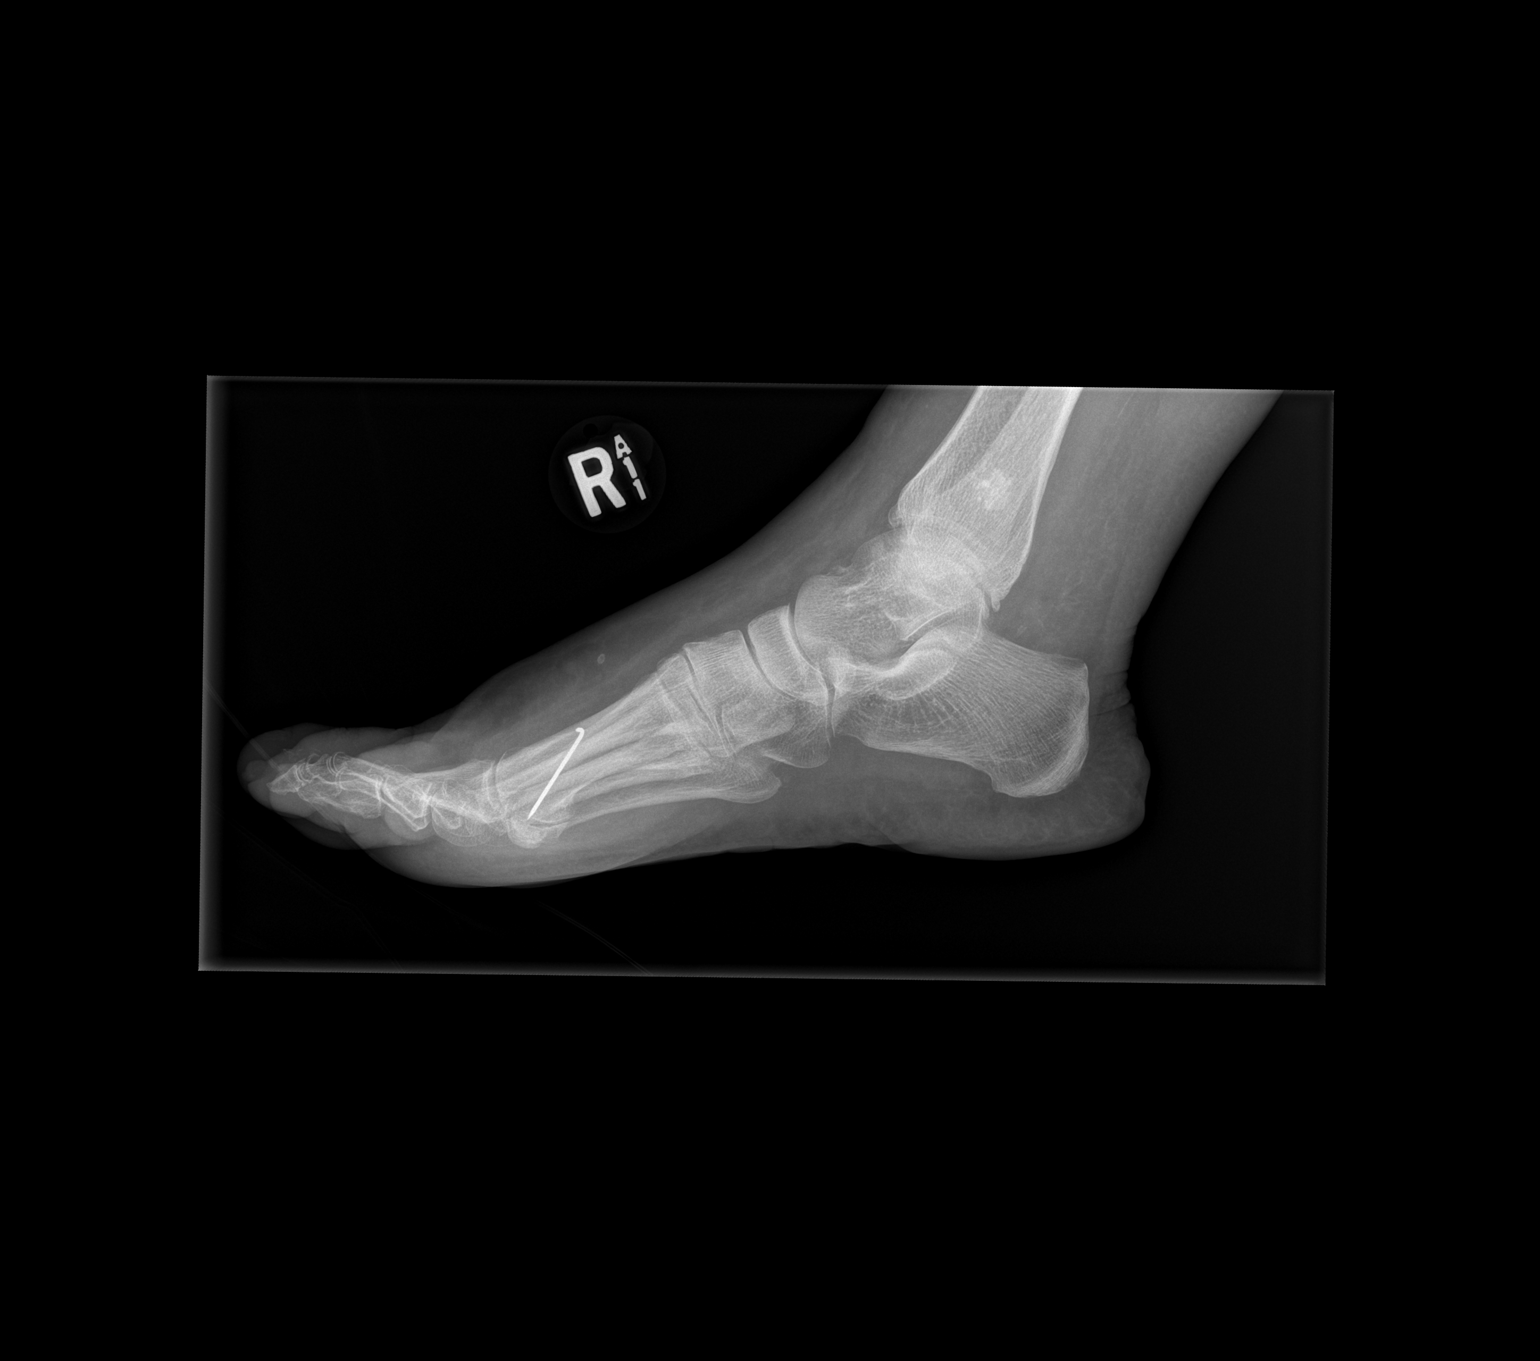

[3 of 3 positions shown; findings below may reference images not displayed]

FINDINGS: Postsurgical changes are noted in the first metatarsal head. There
is swelling involving primarily the third digit with destructive
changes of the third middle and distal phalanx consistent with
osteomyelitis. No other areas of bony destruction are seen.
Generalized soft tissue swelling around the metatarsals is seen.
IMPRESSION: Changes of osteomyelitis involving the middle and distal phalanges
of the third toe.

## 2017-07-29 MED ORDER — CEPHALEXIN 500 MG PO CAPS: 500.0000 mg | ORAL_CAPSULE | Freq: Four times a day (QID) | ORAL | 0 refills | Status: DC

## 2017-07-29 MED ORDER — SULFAMETHOXAZOLE-TRIMETHOPRIM 800-160 MG PO TABS: 1.0000 | ORAL_TABLET | Freq: Two times a day (BID) | ORAL | 0 refills | Status: DC

## 2017-07-29 NOTE — ED Triage Notes (Addendum)
Scabbed wound to back of right leg.  Right middle toe is double in size.  Toe is painful, red, swollen and weeping at the tip of the toe, skin peeling.  Redness to area of foot behind toe.  Left dp 2 +.  Patient reports no known injury and symptoms for 3 weeks.

## 2017-07-29 NOTE — ED Provider Notes (Addendum)
[place] EMERGENCY DEPARTMENT Provider Note   CSN: [id number] Arrival date & time: 07/29/17  1445     History   Chief Complaint Chief Complaint  Patient presents with  . Cellulitis    HPI Debbie Barry is a 67 y.o. female.  HPI Patient presents with infection of the right third toe.  States it began around 3 weeks ago and thought that a spider bit her.  Began after she was wearing some boots.  States since then she has been "doctoring" on it herself.  States the toe actually looks better than it has.  Had previously had fevers with it.  Has had drainage.  Some pain.  Some swelling of the foot.  Has not had infections like this before. Past Medical History:  Diagnosis Date  . Hypertension     There are no active problems to display for this patient.   Past Surgical History:  Procedure Laterality Date  . Bunionectomy Right 2006     Right  . Bunionectony Left 2006      August  . COLONOSCOPY N/A 01/21/2014   Procedure: COLONOSCOPY;  Surgeon: Beryle Beams, MD;  Location: [facility];  Service: Endoscopy;  Laterality: N/A;    OB History    No data available       Home Medications    Prior to Admission medications   Medication Sig Start Date End Date Taking? Authorizing Provider  albuterol (PROVENTIL HFA;VENTOLIN HFA) 108 (90 Base) MCG/ACT inhaler Inhale 2 puffs into the lungs every 4 (four) hours as needed for wheezing or shortness of breath. 11/18/16   Janne Napoleon, NP  aspirin EC 325 MG tablet Take 325 mg by mouth daily.    [provider]  carvedilol (COREG) 3.125 MG tablet Take 3.125 mg by mouth 2 (two) times daily.    [provider]  cephALEXin (KEFLEX) 500 MG capsule Take 1 capsule (500 mg total) by mouth 4 (four) times daily. 07/29/17   Davonna Belling, MD  Multiple Vitamin (MULTIVITAMIN WITH MINERALS) TABS tablet Take 1 tablet by mouth daily.    [provider]  niacin 100 MG tablet Take 1,000 mg by mouth at  bedtime.    [provider]  OVER THE COUNTER MEDICATION "cilastazol 100mg     [provider]  sertraline (ZOLOFT) 25 MG tablet Take 25 mg by mouth daily.    [provider]  sulfamethoxazole-trimethoprim (BACTRIM DS,SEPTRA DS) 800-160 MG tablet Take 1 tablet by mouth 2 (two) times daily. 07/29/17   Davonna Belling, MD  Tetrahydrozoline HCl (VISINE OP) Apply 1 drop to eye as needed (dry/irritated eyes).    [provider]    Family History Family History  Problem Relation Age of Onset  . Breast cancer Mother     Social History Social History   Tobacco Use  . Smoking status: Never Smoker  . Smokeless tobacco: Never Used  Substance Use Topics  . Alcohol use: No    Comment: social  . Drug use: No     Allergies   Patient has no known allergies.   Review of Systems Review of Systems  Constitutional: Positive for fever. Negative for appetite change.  HENT: Negative for congestion.   Respiratory: Negative for shortness of breath.   Cardiovascular: Negative for chest pain.  Gastrointestinal: Negative for abdominal pain.  Endocrine: Negative for polyuria.  Genitourinary: Negative for flank pain.  Musculoskeletal: Negative for back pain.       Right foot/toe infection.  Skin: Negative for rash.  Neurological: Negative for seizures.  Hematological: Negative for adenopathy.  Psychiatric/Behavioral: Negative for confusion.     Physical Exam Updated Vital Signs BP (!) 181/81   Pulse 86   Temp 98.6 F (37 C) (Oral)   Resp 16   SpO2 98%   Physical Exam  Constitutional: She appears well-developed.  HENT:  Head: Normocephalic.  Eyes: Pupils are equal, round, and reactive to light.  Neck: Neck supple.  Cardiovascular: Normal rate.  Pulmonary/Chest: Effort normal.  Abdominal: There is no tenderness.  Musculoskeletal: She exhibits tenderness.  Patient with some erythema and swelling on dorsal of right foot.  Swelling of right third  toe.  Some purulent drainage distally.  Pulses intact  Neurological: She is alert.  Skin: Skin is warm. Capillary refill takes less than 2 seconds.     ED Treatments / Results  Labs (all labs ordered are listed, but only abnormal results are displayed) Labs Reviewed  CBC WITH DIFFERENTIAL/PLATELET - Abnormal; Notable for the following components:      Result Value   Hemoglobin 16.3 (*)    HCT 48.3 (*)    MCV 106.2 (*)    MCH 35.8 (*)    All other components within normal limits  BASIC METABOLIC PANEL - Abnormal; Notable for the following components:   Potassium 3.4 (*)    Chloride 100 (*)    Glucose, Bld 104 (*)    Calcium 8.4 (*)    All other components within normal limits    EKG  EKG Interpretation None       Radiology Dg Foot Complete Right  Result Date: 07/29/2017 CLINICAL DATA:  Right foot pain and swelling with signs of infection, initial encounter EXAM: RIGHT FOOT COMPLETE - 3+ VIEW COMPARISON:  None. FINDINGS: Postsurgical changes are noted in the first metatarsal head. There is swelling involving primarily the third digit with destructive changes of the third middle and distal phalanx consistent with osteomyelitis. No other areas of bony destruction are seen. Generalized soft tissue swelling around the metatarsals is seen. IMPRESSION: Changes of osteomyelitis involving the middle and distal phalanges of the third toe. Electronically Signed   By: Inez Catalina M.D.   On: 07/29/2017 15:37    Procedures Procedures (including critical care time)  Medications Ordered in ED Medications - No data to display   Initial Impression / Assessment and Plan / ED Course  I have reviewed the triage vital signs and the nursing notes.  Pertinent labs & imaging results that were available during my care of the patient were reviewed by me and considered in my medical decision making (see chart for details).     Patient with osteomyelitis of right third toe.  X-ray shows  breakdown of the bone.  Patient does not appear septic, has no fever and a normal white count.  Discussed with Dr. Marlou Sa from orthopedic surgery.  Suggests that patient can be discharged to follow-up with Dr. Sharol Given in the office tomorrow.  Will give antibiotics.  Final Clinical Impressions(s) / ED Diagnoses   Final diagnoses:  Osteomyelitis of right foot, unspecified type [facility])    ED Discharge Orders        Ordered    cephALEXin (KEFLEX) 500 MG capsule  4 times daily     07/29/17 2002    sulfamethoxazole-trimethoprim (BACTRIM DS,SEPTRA DS) 800-160 MG tablet  2 times daily     07/29/17 Clenton Pare, MD 07/29/17 Curly Rim  Davonna Belling, MD 07/29/17 2029

## 2017-07-29 NOTE — ED Triage Notes (Signed)
To ED for further eval of redness, swelling, and drainage from right 3rd toe. Pt states she thinks she had a spider bite - first noticed 3 wks ago. Pt attempting to treat at home with antibiotic cream and soaking. PA in for eval. Pt denies fevers.

## 2017-07-29 NOTE — ED Notes (Signed)
Merry Proud - PA aware of pt's BP.

## 2017-07-29 NOTE — ED Notes (Signed)
Chart reviewed.

## 2017-07-29 NOTE — ED Provider Notes (Signed)
Patient placed in Quick Look pathway, seen and evaluated   Chief Complaint: Foot infection  HPI:   67 year old female presents today with redness swelling to the right third toe.  She notes 3-week history of this, notes this happened after wearing tight shoes, does not recall any specific trauma or abscess.   ROS: Foot pain (one)  Physical Exam:   Gen: No distress  Neuro: Awake and Alert  Skin: Warm    Focused Exam:      Initiation of care has begun. The patient has been counseled on the process, plan, and necessity for staying for the completion/evaluation, and the remainder of the medical screening examination    Okey Regal, Hershal Coria 07/29/17 [place], Wenda Overland, MD 08/01/17 [phone]

## 2017-07-29 NOTE — ED Provider Notes (Signed)
[place]    CSN: [id number] Arrival date & time: 07/29/17  1315     History   Chief Complaint Chief Complaint  Patient presents with  . Insect Bite  . Toe Pain    HPI Debbie Barry is a 67 y.o. female.   67 year old female with history of HTN comes in for 3-week history of toe pain.  States that she thinks a spider bit her right middle toe a few weeks ago, stating she has been seeing spiders around the house.  She has been putting on ointments with warm compress.  However, states wound has worsened in the past 3 weeks, and came in for evaluation.  Denies fever, chills, night sweats.  States that she has had numbness to the foot and the toe.  Denies history of diabetes.  Patient hypertensive at triage with blood pressure of 191/97.  Denies chest pain, shortness of breath, headache, blurry vision, one-sided weakness.      Past Medical History:  Diagnosis Date  . Hypertension     There are no active problems to display for this patient.   Past Surgical History:  Procedure Laterality Date  . Bunionectomy Right 2006     Right  . Bunionectony Left 2006      August  . COLONOSCOPY N/A 01/21/2014   Procedure: COLONOSCOPY;  Surgeon: Beryle Beams, MD;  Location: [facility] END[facility]COPY;  Service: Endoscopy;  Laterality: N/A;    OB History    No data available       Home Medications    Prior to Admission medications   Medication Sig Start Date End Date Taking? Authorizing Provider  albuterol (PROVENTIL HFA;VENTOLIN HFA) 108 (90 Base) MCG/ACT inhaler Inhale 2 puffs into the lungs every 4 (four) hours as needed for wheezing or shortness of breath. 11/18/16   Janne Napoleon, NP  aspirin EC 325 MG tablet Take 325 mg by mouth daily.    [provider]  carvedilol (COREG) 3.125 MG tablet Take 3.125 mg by mouth 2 (two) times daily.    [provider]  Multiple Vitamin (MULTIVITAMIN WITH MINERALS) TABS tablet Take 1 tablet by mouth daily.    [provider]  niacin 100 MG tablet Take 1,000 mg by mouth at bedtime.    [provider]  OVER THE COUNTER MEDICATION "cilastazol 100mg     [provider]  sertraline (ZOLOFT) 25 MG tablet Take 25 mg by mouth daily.    [provider]  Tetrahydrozoline HCl (VISINE OP) Apply 1 drop to eye as needed (dry/irritated eyes).    [provider]    Family History Family History  Problem Relation Age of Onset  . Breast cancer Mother     Social History Social History   Tobacco Use  . Smoking status: Never Smoker  . Smokeless tobacco: Never Used  Substance Use Topics  . Alcohol use: No    Comment: social  . Drug use: No     Allergies   Patient has no known allergies.   Review of Systems Review of Systems  Reason unable to perform ROS: See HPI as above.     Physical Exam Triage Vital Signs ED Triage Vitals  Enc Vitals Group     BP 07/29/17 1350 (!) 191/97     Pulse Rate 07/29/17 1350 (!) 108     Resp 07/29/17 1350 (!) 24     Temp 07/29/17 1350 98.8 F (37.1 C)     Temp Source 07/29/17 1350  Oral     SpO2 07/29/17 1350 96 %     Weight --      Height --      Head Circumference --      Peak Flow --      Pain Score 07/29/17 1348 10     Pain Loc --      Pain Edu? --      Excl. in [place]? --    No data found.  Updated Vital Signs BP (!) 191/97 (BP Location: Left Arm) Comment: regular cuff, left forearm  Pulse (!) 108   Temp 98.8 F (37.1 C) (Oral)   Resp (!) 24   SpO2 96%   Physical Exam  Constitutional: She is oriented to person, place, and time. She appears well-developed and well-nourished. No distress.  HENT:  Head: Normocephalic and atraumatic.  Eyes: Conjunctivae are normal. Pupils are equal, round, and reactive to light.  Musculoskeletal:  See picture below.  Right third toe with swelling and erythema.  Erythema spreads up to one third of distal foot with increased warmth.  Pitting edema up to the ankle.  Small drainage  seen at the tip of the toe.  Tenderness to palpation of the distal MTP joints.  Decreased sensation of the third toe.  No Refill appreciated.  Decreased range of motion.  Neurological: She is alert and oriented to person, place, and time.          UC Treatments / Results  Labs (all labs ordered are listed, but only abnormal results are displayed) Labs Reviewed - No data to display  EKG  EKG Interpretation None       Radiology Dg Foot Complete Right  Result Date: 07/29/2017 CLINICAL DATA:  Right foot pain and swelling with signs of infection, initial encounter EXAM: RIGHT FOOT COMPLETE - 3+ VIEW COMPARISON:  None. FINDINGS: Postsurgical changes are noted in the first metatarsal head. There is swelling involving primarily the third digit with destructive changes of the third middle and distal phalanx consistent with osteomyelitis. No other areas of bony destruction are seen. Generalized soft tissue swelling around the metatarsals is seen. IMPRESSION: Changes of osteomyelitis involving the middle and distal phalanges of the third toe. Electronically Signed   By: Inez Catalina M.D.   On: 07/29/2017 15:37    Procedures Procedures (including critical care time)  Medications Ordered in UC Medications - No data to display   Initial Impression / Assessment and Plan / UC Course  I have reviewed the triage vital signs and the nursing notes.  Pertinent labs & imaging results that were available during my care of the patient were reviewed by me and considered in my medical decision making (see chart for details).    67 year old female with history of HTN presents to the urgent care with 3-week history of toe pain and wound.  She states possible insect bite, but has been having spreading erythema with numbness to the toe.  Denies history of diabetes.  Patient is afebrile on exam, though is hypertensive, tachycardic, and tachypneic.  On exam, significant swelling of the right third toe with  erythema and increased warmth.  Drainage seen the tip of the toe.  Decreased sensation of the toe.  Given history and exam, patient discharged in stable condition to the emergency department for further evaluation.  Discussed case with Dr Mannie Stabile, who agrees to plan.   Final Clinical Impressions(s) / UC Diagnoses   Final diagnoses:  Toe infection    ED Discharge Orders  None        Ok Edwards, [place] 07/29/17 1730

## 2017-07-29 NOTE — Discharge Instructions (Signed)
Given the appearance of your toe, you will need further evaluation then we can provide. Please go to the emergency department for further evaluation needed.

## 2017-07-29 NOTE — Discharge Instructions (Addendum)
Call the office to follow-up with Dr. Sharol Given in the office tomorrow.

## 2017-08-01 ENCOUNTER — Ambulatory Visit (INDEPENDENT_AMBULATORY_CARE_PROVIDER_SITE_OTHER): Payer: No Typology Code available for payment source | Admitting: Physician Assistant

## 2017-08-01 ENCOUNTER — Encounter (INDEPENDENT_AMBULATORY_CARE_PROVIDER_SITE_OTHER): Payer: Self-pay | Admitting: Physician Assistant

## 2017-08-01 ENCOUNTER — Telehealth (INDEPENDENT_AMBULATORY_CARE_PROVIDER_SITE_OTHER): Payer: Self-pay | Admitting: Orthopaedic Surgery

## 2017-08-01 DIAGNOSIS — M86171 Other acute osteomyelitis, right ankle and foot: Secondary | ICD-10-CM | POA: Diagnosis not present

## 2017-08-01 NOTE — Telephone Encounter (Signed)
Pt had appt today with lindsey and will need a work note. Pt will be back in the office on Tuesday.

## 2017-08-01 NOTE — Telephone Encounter (Signed)
See message below °

## 2017-08-01 NOTE — Telephone Encounter (Signed)
Ok, thanks.

## 2017-08-01 NOTE — Progress Notes (Signed)
Office Visit Note   Patient: Debbie Barry           Date of Birth: 17-Apr-1951           MRN: [id number] Visit Date: 08/01/2017              Requested by: Glendale Chard, [address] [place], [place] [address] PCP: Glendale Chard, MD   Assessment & Plan: Visit Diagnoses:  1. Acute osteomyelitis of toe of right foot ([place])     Plan: At this point, Debbie Barry is on oral antibiotics and she is not currently toxic.  We are going to put a Band-Aid over her toe and have her follow-up with Dr. Sharol Given Tuesday afternoon for likely scheduling of an amputation as she has osteomyelitis.  She has been instructed to call the on-call provider over the weekend if anything changes.  Follow-Up Instructions: Return in about 4 days (around 08/05/2017) for with Dr. Sharol Given.   Orders:  No orders of the defined types were placed in this encounter.  No orders of the defined types were placed in this encounter.     Procedures: No procedures performed   Clinical Data: No additional findings.   Subjective: No chief complaint on file.   HPI Debbie Barry is a pleasant 67 year old female who is here for follow-up from her hospital visit on 07/29/2017.  She is about 3-4 weeks ago while wearing a boot, she noticed increased pain that progressively worsened throughout the day.  No specific injury, however she did find 2 spiders in her house that same day and thinks she may have had a spider in her boot.  This was to her third toe, right foot.  She tried to "doctor" it herself over the course of the past few weeks.  She was using vinegar Epson salt soaks.  She thought it was getting better, but then started to get worse.  She started noticing purulent drainage and then her toenail fell off.  She was having intermittent fevers over the past week, but these have resolved.  She was seen in the ED with increasing pain on 07/29/2017.  Labs were done and x-rays were obtained.  Her white blood cell count was 8.8.  X-ray  of her right foot showed osteomyelitis to the third toe.  She was placed on Keflex and Bactrim.  Of note, she is not diabetic and she is not a smoker.  Review of Systems as detailed in HPI.  All others reviewed and are negative.   Objective: Vital Signs: There were no vitals taken for this visit.  Physical Exam well-developed well-nourished female in no acute distress.  Alert and oriented x3.  Ortho Exam examination of her third toe, right foot reveals marked swelling.  Purulent drainage noted.  Marked tenderness.  Specialty Comments:  No specialty comments available.  Imaging: No new images done today.   PMFS History: Patient Active Problem List   Diagnosis Date Noted  . Acute osteomyelitis of toe of right foot ([place]) 08/01/2017   Past Medical History:  Diagnosis Date  . Hypertension     Family History  Problem Relation Age of Onset  . Breast cancer Mother     Past Surgical History:  Procedure Laterality Date  . Bunionectomy Right 2006     Right  . Bunionectony Left 2006      August  . COLONOSCOPY N/A 01/21/2014   Procedure: COLONOSCOPY;  Surgeon: Beryle Beams, MD;  [facility]: [facility];  Service: Endoscopy;  Laterality: N/A;   Social History   Occupational History  . Not on file  Tobacco Use  . Smoking status: Never Smoker  . Smokeless tobacco: Never Used  Substance and Sexual Activity  . Alcohol use: No    Comment: social  . Drug use: No  . Sexual activity: Not on file

## 2017-08-01 NOTE — Telephone Encounter (Signed)
Letter written left up front, she states she would pick up at her appointment on Tuesday.

## 2017-08-05 ENCOUNTER — Encounter (INDEPENDENT_AMBULATORY_CARE_PROVIDER_SITE_OTHER): Payer: Self-pay | Admitting: Orthopedic Surgery

## 2017-08-05 ENCOUNTER — Ambulatory Visit (INDEPENDENT_AMBULATORY_CARE_PROVIDER_SITE_OTHER): Payer: No Typology Code available for payment source | Admitting: Orthopedic Surgery

## 2017-08-05 DIAGNOSIS — M869 Osteomyelitis, unspecified: Secondary | ICD-10-CM

## 2017-08-05 NOTE — Progress Notes (Signed)
   Office Visit Note   Patient: Debbie Barry           Date of Birth: 03-Aug-1950           MRN: [id number] Visit Date: 08/05/2017              Requested by: Glendale Chard, [address] [place], [place] [address] PCP: Glendale Chard, MD  Chief Complaint  Patient presents with  . Right Foot - Pain      HPI: Patient is a 67 year old woman with chronic osteomyelitis of the right foot third toe.  Patient has been on Bactrim DS as well as Keflex.  Assessment & Plan: Visit Diagnoses:  1. Osteomyelitis of toe of right foot ([place])     Plan: Discussed with the patient due to the cellulitis and osteomyelitis and failure of conservative therapy with oral antibiotics her best option is to proceed with a amputation of the third toe right foot through the MTP joint.  Risk and benefits were discussed including risk of the wound not healing.  Patient states she understands she wished to proceed with surgery as soon as possible.  Plan for surgery on Friday.  Follow-Up Instructions: No Follow-up on file.   Ortho Exam  Patient is alert, oriented, no adenopathy, well-dressed, normal affect, normal respiratory effort. Examination patient has a good dorsalis pedis pulse there is incision dorsally over the great toe secondary to previous bunion surgery.  She has sausage digit swelling with cellulitis ulceration and drainage from the third toe.  The ulcer probes to bone.  Imaging: No results found. No images are attached to the encounter.  Labs: No results found for: HGBA1C, ESRSEDRATE, CRP, LABURIC, REPTSTATUS, GRAMSTAIN, CULT, LABORGA  @LABSALLVALUES (HGBA1)@  There is no height or weight on file to calculate BMI.  Orders:  No orders of the defined types were placed in this encounter.  No orders of the defined types were placed in this encounter.    Procedures: No procedures performed  Clinical Data: No additional findings.  ROS:  All other systems negative, except  as noted in the HPI. Review of Systems  Objective: Vital Signs: There were no vitals taken for this visit.  Specialty Comments:  No specialty comments available.  PMFS History: Patient Active Problem List   Diagnosis Date Noted  . Acute osteomyelitis of toe of right foot ([place]) 08/01/2017   Past Medical History:  Diagnosis Date  . Hypertension     Family History  Problem Relation Age of Onset  . Breast cancer Mother     Past Surgical History:  Procedure Laterality Date  . Bunionectomy Right 2006     Right  . Bunionectony Left 2006      August  . COLONOSCOPY N/A 01/21/2014   Procedure: COLONOSCOPY;  Surgeon: Beryle Beams, MD;  [facility];  Service: Endoscopy;  Laterality: N/A;   Social History   Occupational History  . Not on file  Tobacco Use  . Smoking status: Never Smoker  . Smokeless tobacco: Never Used  Substance and Sexual Activity  . Alcohol use: No    Comment: social  . Drug use: No  . Sexual activity: Not on file

## 2017-08-06 ENCOUNTER — Other Ambulatory Visit (INDEPENDENT_AMBULATORY_CARE_PROVIDER_SITE_OTHER): Payer: Self-pay | Admitting: Family

## 2017-08-06 ENCOUNTER — Telehealth (INDEPENDENT_AMBULATORY_CARE_PROVIDER_SITE_OTHER): Payer: Self-pay | Admitting: Orthopedic Surgery

## 2017-08-06 NOTE — Telephone Encounter (Signed)
Patient called saying note she received did not have correct dates, she said it wrote her going back to work today (08/06/17) but she has surgery on Friday so she needs to be written out after surgery as well.   Also, patient needs a call regarding the time of her surgery Debbie Barry.   Please advise # [phone]

## 2017-08-07 ENCOUNTER — Other Ambulatory Visit: Payer: Self-pay

## 2017-08-07 ENCOUNTER — Other Ambulatory Visit (INDEPENDENT_AMBULATORY_CARE_PROVIDER_SITE_OTHER): Payer: Self-pay

## 2017-08-07 ENCOUNTER — Encounter (HOSPITAL_COMMUNITY): Payer: Self-pay | Admitting: *Deleted

## 2017-08-07 NOTE — Progress Notes (Signed)
Pt denies SOB, chest pain, and being under the care of a cardiologist. Pt denies having a cardiac cath and echo. Pt stated that a stress test and EKG were performed at Dr. Irven Shelling office last year.; records requested. Pt denies having a chest x ray within the last year. Pt made aware to stop taking vitamins, fish oil and herbal medications. Do not take any NSAIDs ie: Ibuprofen, Advil, Naproxen (Aleve), Motrin, BC and Goody Powder. Pt verbalized understanding of all pre-op instructions.

## 2017-08-07 NOTE — Telephone Encounter (Signed)
Called pt to advise that revised note states out of work from 08/01/17 through 08/29/17 estimate depending on how pt heals after surgery. Pt states that she will pick this up tomorrow before her arrive time at the hospital.

## 2017-08-08 ENCOUNTER — Encounter (HOSPITAL_COMMUNITY)
Admission: RE | Disposition: A | Discharge status: Home / Self Care | Payer: Self-pay | Source: Ambulatory Visit | Attending: Orthopedic Surgery

## 2017-08-08 ENCOUNTER — Ambulatory Visit (HOSPITAL_COMMUNITY)
Admission: RE | Admit: 2017-08-08 | Discharge: 2017-08-08 | Disposition: A | Discharge status: Home / Self Care | Payer: No Typology Code available for payment source | Source: Ambulatory Visit | Attending: Orthopedic Surgery | Admitting: Orthopedic Surgery

## 2017-08-08 ENCOUNTER — Ambulatory Visit (HOSPITAL_COMMUNITY): Payer: No Typology Code available for payment source | Admitting: Certified Registered"

## 2017-08-08 ENCOUNTER — Encounter (HOSPITAL_COMMUNITY): Payer: Self-pay | Admitting: *Deleted

## 2017-08-08 DIAGNOSIS — I739 Peripheral vascular disease, unspecified: Secondary | ICD-10-CM | POA: Diagnosis not present

## 2017-08-08 DIAGNOSIS — M86171 Other acute osteomyelitis, right ankle and foot: Secondary | ICD-10-CM | POA: Diagnosis not present

## 2017-08-08 DIAGNOSIS — Z79899 Other long term (current) drug therapy: Secondary | ICD-10-CM | POA: Diagnosis not present

## 2017-08-08 DIAGNOSIS — M86671 Other chronic osteomyelitis, right ankle and foot: Secondary | ICD-10-CM | POA: Insufficient documentation

## 2017-08-08 DIAGNOSIS — I10 Essential (primary) hypertension: Secondary | ICD-10-CM | POA: Insufficient documentation

## 2017-08-08 HISTORY — DX: Osteomyelitis, unspecified: M86.9

## 2017-08-08 HISTORY — DX: Gastro-esophageal reflux disease without esophagitis: K21.9

## 2017-08-08 HISTORY — DX: Fatty (change of) liver, not elsewhere classified: K76.0

## 2017-08-08 HISTORY — PX: AMPUTATION: SHX166

## 2017-08-08 HISTORY — DX: Peripheral vascular disease, unspecified: I73.9

## 2017-08-08 SURGERY — AMPUTATION DIGIT
Anesthesia: General | Site: Foot | Laterality: Right

## 2017-08-08 MED ORDER — DEXAMETHASONE SODIUM PHOSPHATE 10 MG/ML IJ SOLN
INTRAMUSCULAR | Status: DC | PRN
  Administered 2017-08-08: 10 mg via INTRAVENOUS

## 2017-08-08 MED ORDER — LIDOCAINE 2% (20 MG/ML) 5 ML SYRINGE
INTRAMUSCULAR | Status: DC | PRN
  Administered 2017-08-08: 60 mg via INTRAVENOUS

## 2017-08-08 MED ORDER — CHLORHEXIDINE GLUCONATE 4 % EX LIQD: 60.0000 mL | Freq: Once | CUTANEOUS | Status: DC

## 2017-08-08 MED ORDER — OXYCODONE HCL 5 MG/5ML PO SOLN: 5.0000 mg | Freq: Once | ORAL | Status: DC | PRN

## 2017-08-08 MED ORDER — PROPOFOL 10 MG/ML IV BOLUS
INTRAVENOUS | Status: DC | PRN
  Administered 2017-08-08: 150 mg via INTRAVENOUS
  Administered 2017-08-08: 100 mg via INTRAVENOUS
  Administered 2017-08-08: 50 mg via INTRAVENOUS

## 2017-08-08 MED ORDER — FENTANYL CITRATE (PF) 250 MCG/5ML IJ SOLN
INTRAMUSCULAR | Status: AC
  Filled 2017-08-08: qty 5

## 2017-08-08 MED ORDER — CEFAZOLIN SODIUM-DEXTROSE 2-4 GM/100ML-% IV SOLN
2.0000 g | INTRAVENOUS | Status: AC
  Administered 2017-08-08: 2 g via INTRAVENOUS

## 2017-08-08 MED ORDER — MIDAZOLAM HCL 2 MG/2ML IJ SOLN
INTRAMUSCULAR | Status: AC
  Filled 2017-08-08: qty 2

## 2017-08-08 MED ORDER — CEFAZOLIN SODIUM-DEXTROSE 2-4 GM/100ML-% IV SOLN
INTRAVENOUS | Status: AC
  Filled 2017-08-08: qty 100

## 2017-08-08 MED ORDER — 0.9 % SODIUM CHLORIDE (POUR BTL) OPTIME
TOPICAL | Status: DC | PRN
  Administered 2017-08-08: 1000 mL

## 2017-08-08 MED ORDER — FENTANYL CITRATE (PF) 100 MCG/2ML IJ SOLN
INTRAMUSCULAR | Status: DC | PRN
  Administered 2017-08-08 (×2): 50 ug via INTRAVENOUS

## 2017-08-08 MED ORDER — FENTANYL CITRATE (PF) 100 MCG/2ML IJ SOLN: 25.0000 ug | INTRAMUSCULAR | Status: DC | PRN

## 2017-08-08 MED ORDER — PROPOFOL 10 MG/ML IV BOLUS
INTRAVENOUS | Status: AC
  Filled 2017-08-08: qty 20

## 2017-08-08 MED ORDER — ONDANSETRON HCL 4 MG/2ML IJ SOLN
INTRAMUSCULAR | Status: DC | PRN
  Administered 2017-08-08: 4 mg via INTRAVENOUS

## 2017-08-08 MED ORDER — LACTATED RINGERS IV SOLN
INTRAVENOUS | Status: DC
  Administered 2017-08-08: 13:00:00 via INTRAVENOUS

## 2017-08-08 MED ORDER — HYDROCODONE-ACETAMINOPHEN 5-325 MG PO TABS: 1.0000 | ORAL_TABLET | ORAL | 0 refills | Status: DC | PRN

## 2017-08-08 MED ORDER — OXYCODONE HCL 5 MG PO TABS: 5.0000 mg | ORAL_TABLET | Freq: Once | ORAL | Status: DC | PRN

## 2017-08-08 MED ORDER — ONDANSETRON HCL 4 MG/2ML IJ SOLN: 4.0000 mg | Freq: Four times a day (QID) | INTRAMUSCULAR | Status: DC | PRN

## 2017-08-08 MED ORDER — MIDAZOLAM HCL 2 MG/2ML IJ SOLN
INTRAMUSCULAR | Status: DC | PRN
  Administered 2017-08-08: 1 mg via INTRAVENOUS

## 2017-08-08 SURGICAL SUPPLY — 28 items
BLADE SURG 21 STRL SS (BLADE) ×2 IMPLANT
BNDG COHESIVE 4X5 TAN STRL (GAUZE/BANDAGES/DRESSINGS) ×2 IMPLANT
BNDG ESMARK 4X9 LF (GAUZE/BANDAGES/DRESSINGS) IMPLANT
BNDG GAUZE ELAST 4 BULKY (GAUZE/BANDAGES/DRESSINGS) ×2 IMPLANT
COVER SURGICAL LIGHT HANDLE (MISCELLANEOUS) ×4 IMPLANT
DRAPE U-SHAPE 47X51 STRL (DRAPES) ×2 IMPLANT
DRSG ADAPTIC 3X8 NADH LF (GAUZE/BANDAGES/DRESSINGS) ×2 IMPLANT
DRSG PAD ABDOMINAL 8X10 ST (GAUZE/BANDAGES/DRESSINGS) ×2 IMPLANT
DURAPREP 26ML APPLICATOR (WOUND CARE) ×2 IMPLANT
ELECT REM PT RETURN 9FT ADLT (ELECTROSURGICAL) ×2
ELECTRODE REM PT RTRN 9FT ADLT (ELECTROSURGICAL) ×1 IMPLANT
GAUZE SPONGE 4X4 12PLY STRL (GAUZE/BANDAGES/DRESSINGS) ×2 IMPLANT
GLOVE BIOGEL PI IND STRL 9 (GLOVE) ×1 IMPLANT
GLOVE BIOGEL PI INDICATOR 9 (GLOVE) ×1
GLOVE SURG ORTHO 9.0 STRL STRW (GLOVE) ×2 IMPLANT
GOWN STRL REUS W/ TWL XL LVL3 (GOWN DISPOSABLE) ×2 IMPLANT
GOWN STRL REUS W/TWL XL LVL3 (GOWN DISPOSABLE) ×2
KIT BASIN OR (CUSTOM PROCEDURE TRAY) ×2 IMPLANT
KIT ROOM TURNOVER OR (KITS) ×2 IMPLANT
MANIFOLD NEPTUNE II (INSTRUMENTS) ×2 IMPLANT
NEEDLE 22X1 1/2 (OR ONLY) (NEEDLE) IMPLANT
NS IRRIG 1000ML POUR BTL (IV SOLUTION) ×2 IMPLANT
PACK ORTHO EXTREMITY (CUSTOM PROCEDURE TRAY) ×2 IMPLANT
PAD ABD 8X10 STRL (GAUZE/BANDAGES/DRESSINGS) ×2 IMPLANT
PAD ARMBOARD 7.5X6 YLW CONV (MISCELLANEOUS) ×4 IMPLANT
SUT ETHILON 2 0 PSLX (SUTURE) ×2 IMPLANT
SYR CONTROL 10ML LL (SYRINGE) IMPLANT
TOWEL OR 17X26 10 PK STRL BLUE (TOWEL DISPOSABLE) ×2 IMPLANT

## 2017-08-08 NOTE — Op Note (Signed)
08/08/2017  3:44 PM  PATIENT:  Debbie Barry    PRE-OPERATIVE DIAGNOSIS:  osteomyelitis right 3rd toe  POST-OPERATIVE DIAGNOSIS:  Same  PROCEDURE:  AMPUTATION RIGHT 3RD TOE  SURGEON:  Newt Minion, MD  PHYSICIAN ASSISTANT:None ANESTHESIA:   General  PREOPERATIVE INDICATIONS:  Debbie Barry is a  67 y.o. female with a diagnosis of osteomyelitis right 3rd toe who failed conservative measures and elected for surgical management.    The risks benefits and alternatives were discussed with the patient preoperatively including but not limited to the risks of infection, bleeding, nerve injury, cardiopulmonary complications, the need for revision surgery, among others, and the patient was willing to proceed.  OPERATIVE IMPLANTS: None  OPERATIVE FINDINGS: Abscess extended into the MTP joint  OPERATIVE PROCEDURE: Patient was brought the operating room and underwent a general LMA anesthetic.  After adequate levels of anesthesia were obtained patient's right lower extremity was prepped using DuraPrep draped into a sterile field a timeout was called.  A V incision was made just distal to the MTP joint.  The toe was amputated through the MTP joint.  There was purulent material extended into the joint from the toe infection.  This was irrigated with normal saline there was no necrotic tissue no signs of any soft tissue infection at the level of the amputation.  After thorough irrigation the incision was closed using 2-0 nylon a sterile dressing was applied patient was extubated taken the PACU in stable condition plan for discharge to home.   DISCHARGE PLANNING:  Antibiotic duration: Perioperative.  Weightbearing: Touchdown weightbearing on the right.  Pain medication: Prescription for Vicodin.  Dressing care/ Wound VAC: Dry dressing change at follow-up in the office.  Ambulatory devices: Walker patient has one at home  Discharge to: Home.  Follow-up: In the office 1 week post  operative.

## 2017-08-08 NOTE — Transfer of Care (Signed)
Immediate Anesthesia Transfer of Care Note  Patient: TOLUWANI RUDER  Procedure(s) Performed: AMPUTATION RIGHT 3RD TOE (Right Foot)  Patient Location: PACU  Anesthesia Type:General  Level of Consciousness: awake, alert  and oriented  Airway & Oxygen Therapy: Patient Spontanous Breathing and Patient connected to nasal cannula oxygen  Post-op Assessment: Report given to RN and Post -op Vital signs reviewed and stable  Post vital signs: Reviewed and stable  Last Vitals:  Vitals:   08/08/17 1316 08/08/17 1553  BP: (!) 164/91   Pulse:    Resp:    Temp:  (P) 36.5 C  SpO2:      Last Pain:  Vitals:   08/08/17 1254  TempSrc: Oral      Patients Stated Pain Goal: 3 (75/44/92 0100)  Complications: No apparent anesthesia complications

## 2017-08-08 NOTE — Anesthesia Preprocedure Evaluation (Signed)
Anesthesia Evaluation  Patient identified by MRN, date of birth, ID band Patient awake    Reviewed: Allergy & Precautions, H&P , NPO status , Patient's Chart, lab work & pertinent test results  Airway Mallampati: II   Neck ROM: full    Dental   Pulmonary neg pulmonary ROS,    breath sounds clear to auscultation       Cardiovascular hypertension, + Peripheral Vascular Disease   Rhythm:regular Rate:Normal     Neuro/Psych    GI/Hepatic GERD  ,  Endo/Other    Renal/GU      Musculoskeletal   Abdominal   Peds  Hematology   Anesthesia Other Findings   Reproductive/Obstetrics                             Anesthesia Physical Anesthesia Plan  ASA: II  Anesthesia Plan: General   Post-op Pain Management:    Induction: Intravenous  PONV Risk Score and Plan: 3 and Ondansetron, Dexamethasone, Midazolam and Treatment may vary due to age or medical condition  Airway Management Planned: LMA  Additional Equipment:   Intra-op Plan:   Post-operative Plan:   Informed Consent: I have reviewed the patients History and Physical, chart, labs and discussed the procedure including the risks, benefits and alternatives for the proposed anesthesia with the patient or authorized representative who has indicated his/her understanding and acceptance.     Plan Discussed with: CRNA, Anesthesiologist and Surgeon  Anesthesia Plan Comments:         Anesthesia Quick Evaluation

## 2017-08-08 NOTE — Progress Notes (Signed)
Orthopedic Tech Progress Note Patient Details:  Debbie Barry 08-11-1950 [id number]  Ortho Devices Type of Ortho Device: Postop shoe/boot Ortho Device/Splint Location: RLE Ortho Device/Splint Interventions: Ordered, Application   Post Interventions Patient Tolerated: Well Instructions Provided: Care of device   Braulio Bosch 08/08/2017, 4:09 PM

## 2017-08-08 NOTE — Progress Notes (Signed)
   08/08/17 1251  OBSTRUCTIVE SLEEP APNEA  Have you ever been diagnosed with sleep apnea through a sleep study? No  Do you snore loudly (loud enough to be heard through closed doors)?  0  Do you often feel tired, fatigued, or sleepy during the daytime (such as falling asleep during driving or talking to someone)? 0  BMI more than 35 kg/m2? 0  Age > 50 (1-yes) 1  Neck circumference greater than:Female 16 inches or larger, Female 17inches or larger? 0  Female Gender (Yes=1) 0  Obstructive Sleep Apnea Score 1  Score 5 or greater  Results sent to PCP

## 2017-08-08 NOTE — Anesthesia Procedure Notes (Signed)
Procedure Name: LMA Insertion Date/Time: 08/08/2017 3:35 PM Performed by: Imagene Riches, CRNA Pre-anesthesia Checklist: Patient identified, Emergency Drugs available, Suction available and Patient being monitored Patient Re-evaluated:Patient Re-evaluated prior to induction Oxygen Delivery Method: Circle System Utilized Preoxygenation: Pre-oxygenation with 100% oxygen Induction Type: IV induction Ventilation: Mask ventilation without difficulty LMA: LMA inserted LMA Size: 5.0 Number of attempts: 1 Airway Equipment and Method: Bite block Placement Confirmation: positive ETCO2 Tube secured with: Tape Dental Injury: Teeth and Oropharynx as per pre-operative assessment  Comments: First attempt with LMA 4, insufficient seal. LMA removed. Patient mask ventilated. Second attempt with LMA 4 provided adequate seal.

## 2017-08-08 NOTE — H&P (Signed)
Debbie Barry is an 67 y.o. female.   Chief Complaint: Chronic osteomyelitis right foot third toe with ulceration swelling and drainage. HPI: Patient is a 67 year old woman with chronic osteomyelitis of the right foot third toe.  Patient has been on Bactrim DS as well as Keflex.    Past Medical History:  Diagnosis Date  . Fatty liver   . GERD (gastroesophageal reflux disease)   . Hypertension   . Osteomyelitis ([place])    right third toe  . Peripheral vascular disease [facility])     Past Surgical History:  Procedure Laterality Date  . Bunionectomy Right 2006     Right  . Bunionectony Left 2006      August  . COLONOSCOPY N/A 01/21/2014   Procedure: COLONOSCOPY;  Surgeon: Beryle Beams, MD;  [facility];  Service: Endoscopy;  Laterality: N/A;  . TUBAL LIGATION    . WISDOM TOOTH EXTRACTION      Family History  Problem Relation Age of Onset  . Breast cancer Mother    Social History:  reports that  has never smoked. she has never used smokeless tobacco. She reports that she drinks alcohol. She reports that she does not use drugs.  Allergies: No Known Allergies  No medications prior to admission.    No results found for this or any previous visit (from the past 48 hour(s)). No results found.  Review of Systems  All other systems reviewed and are negative.   There were no vitals taken for this visit. Physical Exam   Patient is alert, oriented, no adenopathy, well-dressed, normal affect, normal respiratory effort. Examination patient has a good dorsalis pedis pulse there is incision dorsally over the great toe secondary to previous bunion surgery.  She has sausage digit swelling with cellulitis ulceration and drainage from the third toe.  The ulcer probes to bone.   Assessment/Plan 1. Osteomyelitis of toe of right foot ([place])     Plan: Discussed with the patient due to the cellulitis and osteomyelitis and failure of conservative therapy with oral antibiotics her  best option is to proceed with a amputation of the third toe right foot through the MTP joint.  Risk and benefits were discussed including risk of the wound not healing.     Debbie Minion, MD 08/08/2017, 11:19 AM

## 2017-08-09 NOTE — Anesthesia Postprocedure Evaluation (Signed)
Anesthesia Post Note  Patient: Debbie Barry  Procedure(s) Performed: AMPUTATION RIGHT 3RD TOE (Right Foot)     Patient location during evaluation: PACU Anesthesia Type: General Level of consciousness: awake and alert Pain management: pain level controlled Vital Signs Assessment: post-procedure vital signs reviewed and stable Respiratory status: spontaneous breathing, nonlabored ventilation, respiratory function stable and patient connected to nasal cannula oxygen Cardiovascular status: blood pressure returned to baseline and stable Postop Assessment: no apparent nausea or vomiting Anesthetic complications: no    Last Vitals:  Vitals:   08/08/17 1611 08/08/17 1612  BP: (!) 159/89 (!) 146/86  Pulse:  80  Resp:  12  Temp:    SpO2:  98%    Last Pain:  Vitals:   08/08/17 1612  TempSrc:   PainSc: 0-No pain                 [name] S

## 2017-08-10 ENCOUNTER — Encounter (HOSPITAL_COMMUNITY): Payer: Self-pay | Admitting: Orthopedic Surgery

## 2017-08-20 ENCOUNTER — Ambulatory Visit (INDEPENDENT_AMBULATORY_CARE_PROVIDER_SITE_OTHER): Payer: No Typology Code available for payment source | Admitting: Orthopedic Surgery

## 2017-08-20 ENCOUNTER — Encounter (INDEPENDENT_AMBULATORY_CARE_PROVIDER_SITE_OTHER): Payer: Self-pay | Admitting: Orthopedic Surgery

## 2017-08-20 VITALS — Ht 61.5 in | Wt 180.0 lb

## 2017-08-20 DIAGNOSIS — Z89421 Acquired absence of other right toe(s): Secondary | ICD-10-CM

## 2017-08-20 DIAGNOSIS — S98131A Complete traumatic amputation of one right lesser toe, initial encounter: Secondary | ICD-10-CM

## 2017-08-20 MED ORDER — HYDROCODONE-ACETAMINOPHEN 5-325 MG PO TABS: 1.0000 | ORAL_TABLET | Freq: Four times a day (QID) | ORAL | 0 refills | Status: DC | PRN

## 2017-08-20 NOTE — Addendum Note (Signed)
Addended by: Dondra Prader R on: 08/20/2017 02:15 PM   Modules accepted: Orders

## 2017-08-20 NOTE — Progress Notes (Signed)
Office Visit Note   Patient: Debbie Barry           Date of Birth: Sep 27, 1950           MRN: [id number] Visit Date: 08/20/2017              Requested by: Glendale Chard, [address] [place], [place] [address] PCP: Glendale Chard, MD  Chief Complaint  Patient presents with  . Right Foot - Routine Post Op    08/08/17 Right 3rd toe amputation.      HPI: Patient presents 2 weeks status post right foot third toe amputation.  She has no complaints ambulating with a postoperative shoe without assistance.  Assessment & Plan: Visit Diagnoses:  1. Amputated toe, right ([place])     Plan: We will harvest the sutures today she will wear knee-high compression stockings use scar massage and lotion over the third toe incision and work on Achilles stretching.  She is given a note to return to work on March 4.  Follow-Up Instructions: Return in about 4 weeks (around 09/17/2017).   Ortho Exam  Patient is alert, oriented, no adenopathy, well-dressed, normal affect, normal respiratory effort. Examination incision is well-healed there is no drainage no cellulitis no tenderness to palpation.  We will harvest the sutures.  She does have venostasis swelling in her foot and leg.  She states that she has been prescribed compression stockings before but does not wear them.  Imaging: No results found. No images are attached to the encounter.  Labs: No results found for: HGBA1C, ESRSEDRATE, CRP, LABURIC, REPTSTATUS, GRAMSTAIN, CULT, LABORGA  @LABSALLVALUES (HGBA1)@  Body mass index is 33.46 kg/m.  Orders:  No orders of the defined types were placed in this encounter.  No orders of the defined types were placed in this encounter.    Procedures: No procedures performed  Clinical Data: No additional findings.  ROS:  All other systems negative, except as noted in the HPI. Review of Systems  Objective: Vital Signs: Ht 5' 1.5" (1.562 m)   Wt 180 lb (81.6 kg)   BMI 33.46  kg/m   Specialty Comments:  No specialty comments available.  PMFS History: Patient Active Problem List   Diagnosis Date Noted  . Osteomyelitis of toe of right foot ([place]) 08/05/2017  . Acute osteomyelitis of toe of right foot ([place]) 08/01/2017   Past Medical History:  Diagnosis Date  . Fatty liver   . GERD (gastroesophageal reflux disease)   . Hypertension   . Osteomyelitis ([place])    right third toe  . Peripheral vascular disease ([place])     Family History  Problem Relation Age of Onset  . Breast cancer Mother     Past Surgical History:  Procedure Laterality Date  . AMPUTATION Right 08/08/2017   Procedure: AMPUTATION RIGHT 3RD TOE;  Surgeon: Newt Minion, MD;  Location: [facility];  Service: Orthopedics;  Laterality: Right;  . Bunionectomy Right 2006     Right  . Bunionectony Left 2006      August  . COLONOSCOPY N/A 01/21/2014   Procedure: COLONOSCOPY;  Surgeon: Beryle Beams, MD;  [facility];  Service: Endoscopy;  Laterality: N/A;  . TUBAL LIGATION    . WISDOM TOOTH EXTRACTION     Social History   Occupational History  . Not on file  Tobacco Use  . Smoking status: Never Smoker  . Smokeless tobacco: Never Used  Substance and Sexual Activity  . Alcohol use: Yes  Comment: social glass of wine  . Drug use: No  . Sexual activity: Not on file

## 2017-09-17 ENCOUNTER — Encounter (INDEPENDENT_AMBULATORY_CARE_PROVIDER_SITE_OTHER): Payer: Self-pay | Admitting: Orthopedic Surgery

## 2017-09-17 ENCOUNTER — Ambulatory Visit (INDEPENDENT_AMBULATORY_CARE_PROVIDER_SITE_OTHER): Payer: No Typology Code available for payment source | Admitting: Orthopedic Surgery

## 2017-09-17 VITALS — Ht 61.0 in | Wt 180.0 lb

## 2017-09-17 DIAGNOSIS — S98131A Complete traumatic amputation of one right lesser toe, initial encounter: Secondary | ICD-10-CM

## 2017-09-17 DIAGNOSIS — Z89421 Acquired absence of other right toe(s): Secondary | ICD-10-CM

## 2017-09-17 NOTE — Progress Notes (Signed)
Office Visit Note   Patient: Debbie Barry           Date of Birth: [birth year]           MRN: [id number] Visit Date: 09/17/2017              Requested by: Glendale Chard, [address] [place], [place] [address] PCP: Glendale Chard, MD  Chief Complaint  Patient presents with  . Right Foot - Routine Post Op    08/08/17 right 3rd toe amputation       HPI: Patient is a 67 year old woman presents 6 weeks status post right third toe amputation.  Patient complains of pressure over the soft tissue where the third toe was.  She states it feels like it rubbing in her shoe.  Assessment & Plan: Visit Diagnoses:  1. Amputated toe, right ([place])     Plan: Recommended a orthotics such as soled recommended a Trail running sneaker that is stiff to unload the forefoot recommended heel cord stretching.  Follow-Up Instructions: Return if symptoms worsen or fail to improve.   Ortho Exam  Patient is alert, oriented, no adenopathy, well-dressed, normal affect, normal respiratory effort. Examination patient does have some swelling at the surgical site the incision is well-healed there is no redness no cellulitis no signs of infection.  Patient has no callus or pressure points.  She does have dorsiflexion to neutral and the importance of working on heel cord stretching was discussed.  Imaging: No results found. No images are attached to the encounter.  Labs: No results found for: HGBA1C, ESRSEDRATE, CRP, LABURIC, REPTSTATUS, GRAMSTAIN, CULT, LABORGA  @LABSALLVALUES (HGBA1)@  Body mass index is 34.01 kg/m.  Orders:  No orders of the defined types were placed in this encounter.  No orders of the defined types were placed in this encounter.    Procedures: No procedures performed  Clinical Data: No additional findings.  ROS:  All other systems negative, except as noted in the HPI. Review of Systems  Objective: Vital Signs: Ht 5\' 1"  (1.549 m)   Wt 180 lb (81.6 kg)    BMI 34.01 kg/m   Specialty Comments:  No specialty comments available.  PMFS History: Patient Active Problem List   Diagnosis Date Noted  . Osteomyelitis of toe of right foot ([place]) 08/05/2017  . Acute osteomyelitis of toe of right foot ([place]) 08/01/2017   Past Medical History:  Diagnosis Date  . Fatty liver   . GERD (gastroesophageal reflux disease)   . Hypertension   . Osteomyelitis ([place])    right third toe  . Peripheral vascular disease ([place])     Family History  Problem Relation Age of Onset  . Breast cancer Mother     Past Surgical History:  Procedure Laterality Date  . AMPUTATION Right 08/08/2017   Procedure: AMPUTATION RIGHT 3RD TOE;  Surgeon: Newt Minion, MD;  Location: [facility];  Service: Orthopedics;  Laterality: Right;  . Bunionectomy Right 2006     Right  . Bunionectony Left 2006      August  . COLONOSCOPY N/A 01/21/2014   Procedure: COLONOSCOPY;  Surgeon: Beryle Beams, MD;  [facility];  Service: Endoscopy;  Laterality: N/A;  . TUBAL LIGATION    . WISDOM TOOTH EXTRACTION     Social History   Occupational History  . Not on file  Tobacco Use  . Smoking status: Never Smoker  . Smokeless tobacco: Never Used  Substance and Sexual Activity  . Alcohol  use: Yes    Comment: social glass of wine  . Drug use: No  . Sexual activity: Not on file

## 2017-09-24 ENCOUNTER — Telehealth (INDEPENDENT_AMBULATORY_CARE_PROVIDER_SITE_OTHER): Payer: Self-pay | Admitting: Orthopedic Surgery

## 2017-09-24 MED ORDER — HYDROCODONE-ACETAMINOPHEN 5-325 MG PO TABS: 1.0000 | ORAL_TABLET | Freq: Four times a day (QID) | ORAL | 0 refills | Status: DC | PRN

## 2017-09-24 NOTE — Telephone Encounter (Signed)
I sent in some pain meds for her - norco

## 2017-09-24 NOTE — Telephone Encounter (Signed)
Patient advised Rx has been sent to pharmacy [facility]).  Scheduled an appt for tomorrow with Dr. Laveda Abbe 2:45 to check the foot.  She said she also hit her shoulder, so may want to check that, also.

## 2017-09-24 NOTE — Telephone Encounter (Signed)
This is one of Dr. Jess Barters patients, who is s/p rt 3rd toe amputation 08/08/17.  She slipped while getting out of the shower last night and hit that foot against the door.  She said the incision did not open.  The foot is quite painful, with minimal relief from Aleve.  I offered her an appt for tomorrow with Dr. Sharol Given to check the foot, but she is requesting something for pain until then, if possible.  She last received #30 Norco 5/325 on 08/20/17. Please advise.

## 2017-09-24 NOTE — Telephone Encounter (Signed)
Patient called stating that she slipped and fell and her foot/ankle is hurting and wanted to know if she could get an RX for some pain medication.  [phone] or [phone].  Thank you.

## 2017-09-25 ENCOUNTER — Ambulatory Visit (INDEPENDENT_AMBULATORY_CARE_PROVIDER_SITE_OTHER): Payer: No Typology Code available for payment source

## 2017-09-25 ENCOUNTER — Encounter (INDEPENDENT_AMBULATORY_CARE_PROVIDER_SITE_OTHER): Payer: Self-pay | Admitting: Orthopedic Surgery

## 2017-09-25 ENCOUNTER — Ambulatory Visit (INDEPENDENT_AMBULATORY_CARE_PROVIDER_SITE_OTHER): Payer: No Typology Code available for payment source | Admitting: Orthopedic Surgery

## 2017-09-25 VITALS — Ht 61.0 in | Wt 180.0 lb

## 2017-09-25 DIAGNOSIS — M25571 Pain in right ankle and joints of right foot: Secondary | ICD-10-CM | POA: Diagnosis not present

## 2017-09-25 DIAGNOSIS — M25561 Pain in right knee: Secondary | ICD-10-CM | POA: Diagnosis not present

## 2017-09-25 NOTE — Progress Notes (Signed)
Office Visit Note   Patient: Debbie Barry           Date of Birth: Jun 30, 1951           MRN: [id number] Visit Date: 09/25/2017              Requested by: Glendale Chard, [address] [place], [place] [address] PCP: Glendale Chard, MD  Chief Complaint  Patient presents with  . Right Foot - Routine Post Op    08/08/17 right rd toe amputation   . Right Ankle - Pain    S/p fall 09/23/17  . Right Knee - Pain      HPI: Patient is a 67 year old woman who presents for evaluation for a new problem with her right ankle and right knee.  Patient states that she slipped and fell 2 days ago striking her foot in the bathroom standing injury to the right ankle and right knee.  Patient complains of swelling in the ankle and knee pain pain with weightbearing.  She is also status post a right toe third toe amputation.  Assessment & Plan: Visit Diagnoses:  1. Pain in right ankle and joints of right foot   2. Acute pain of right knee     Plan: Recommend she stay out of work until Monday she was given a prescription for this.  She can use an ankle stabilizing orthosis recommended the [address] running sneakers to help support her toe no intervention for her knee.  Follow-Up Instructions: Return if symptoms worsen or fail to improve.   Ortho Exam  Patient is alert, oriented, no adenopathy, well-dressed, normal affect, normal respiratory effort. Examination patient's knee has good range of motion there is no swelling collaterals and cruciates are stable.  Examination of the right ankle she is tender to palpation over the anterior talofibular ligament.  The ankle joint is nontender to palpation she has good ankle and good subtalar range of motion.  Imaging: Xr Ankle Complete Right  Result Date: 09/25/2017 2 view radiographs of the right ankle shows degenerative changes of the tibial talar joint there is calcified cartilage within the metaphyseal region of the distal tibia there is  bony spurs around the ankle joint the joint is congruent no evidence of fracture.  Xr Knee 1-2 Views Right  Result Date: 09/25/2017 2 view radiographs of the right knee shows a congruent joint space  No images are attached to the encounter.  Labs: No results found for: HGBA1C, ESRSEDRATE, CRP, LABURIC, REPTSTATUS, GRAMSTAIN, CULT, LABORGA  @LABSALLVALUES (HGBA1)@  Body mass index is 34.01 kg/m.  Orders:  Orders Placed This Encounter  Procedures  . XR Ankle Complete Right  . XR Knee 1-2 Views Right   No orders of the defined types were placed in this encounter.    Procedures: No procedures performed  Clinical Data: No additional findings.  ROS:  All other systems negative, except as noted in the HPI. Review of Systems  Objective: Vital Signs: Ht 5\' 1"  (1.549 m)   Wt 180 lb (81.6 kg)   BMI 34.01 kg/m   Specialty Comments:  No specialty comments available.  PMFS History: Patient Active Problem List   Diagnosis Date Noted  . Osteomyelitis of toe of right foot ([facility]) 08/05/2017  . Acute osteomyelitis of toe of right foot ([facility]) 08/01/2017   Past Medical History:  Diagnosis Date  . Fatty liver   . GERD (gastroesophageal reflux disease)   . Hypertension   . Osteomyelitis ([facility])  right third toe  . Peripheral vascular disease ([facility])     Family History  Problem Relation Age of Onset  . Breast cancer Mother     Past Surgical History:  Procedure Laterality Date  . AMPUTATION Right 08/08/2017   Procedure: AMPUTATION RIGHT 3RD TOE;  Surgeon: Newt Minion, MD;  Location: [facility];  Service: Orthopedics;  Laterality: Right;  . Bunionectomy Right 2006     Right  . Bunionectony Left 2006      August  . COLONOSCOPY N/A 01/21/2014   Procedure: COLONOSCOPY;  Surgeon: Beryle Beams, MD;  Location: [facility];  Service: Endoscopy;  Laterality: N/A;  . TUBAL LIGATION    . WISDOM TOOTH EXTRACTION     Social History   Occupational History  . Not on file  Tobacco  Use  . Smoking status: Never Smoker  . Smokeless tobacco: Never Used  Substance and Sexual Activity  . Alcohol use: Yes    Comment: social glass of wine  . Drug use: No  . Sexual activity: Not on file

## 2017-12-26 ENCOUNTER — Ambulatory Visit: Payer: No Typology Code available for payment source | Admitting: Cardiovascular Disease

## 2018-02-25 ENCOUNTER — Ambulatory Visit: Payer: No Typology Code available for payment source | Admitting: Cardiovascular Disease

## 2018-03-31 ENCOUNTER — Ambulatory Visit: Payer: No Typology Code available for payment source | Admitting: Cardiovascular Disease

## 2018-06-09 ENCOUNTER — Other Ambulatory Visit: Payer: Self-pay | Admitting: Internal Medicine

## 2018-06-09 DIAGNOSIS — Z1231 Encounter for screening mammogram for malignant neoplasm of breast: Secondary | ICD-10-CM

## 2018-06-18 ENCOUNTER — Ambulatory Visit
Admission: RE | Admit: 2018-06-18 | Discharge: 2018-06-18 | Disposition: A | Discharge status: Home / Self Care | Payer: Medicare HMO | Source: Ambulatory Visit | Attending: Internal Medicine | Admitting: Internal Medicine

## 2018-06-18 DIAGNOSIS — Z1231 Encounter for screening mammogram for malignant neoplasm of breast: Secondary | ICD-10-CM

## 2018-06-18 IMAGING — MG DIGITAL SCREENING BILATERAL MAMMOGRAM WITH TOMO AND CAD
8 series · 8 of 24 positions shown · non-contrast
Comparison: Previous exam(s).

CLINICAL DATA: Screening.

EXAM:
DIGITAL SCREENING BILATERAL MAMMOGRAM WITH TOMO AND CAD

[L MLO synth-2D]
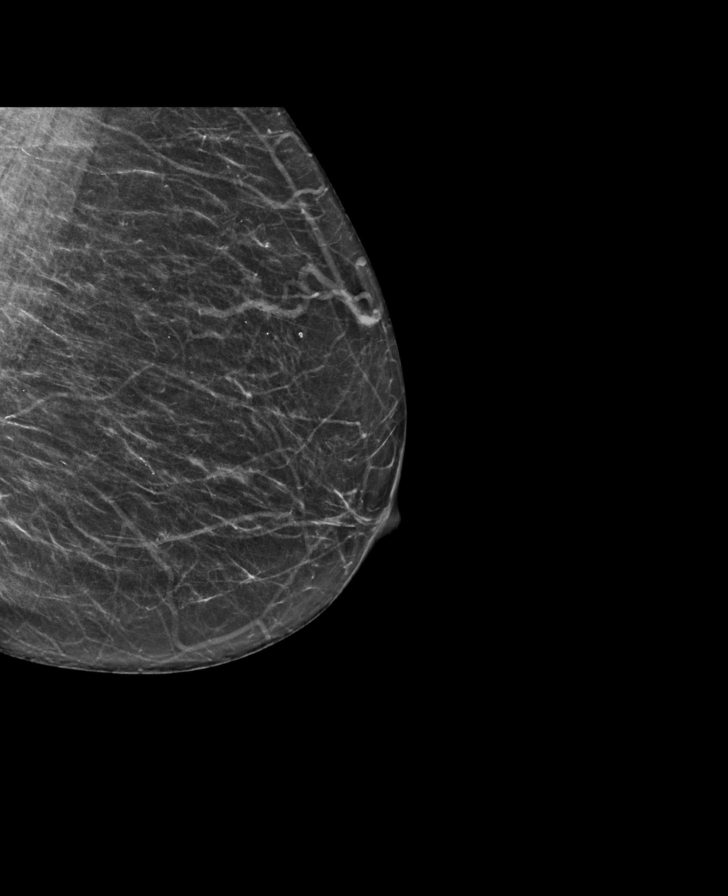

[R MLO synth-2D]
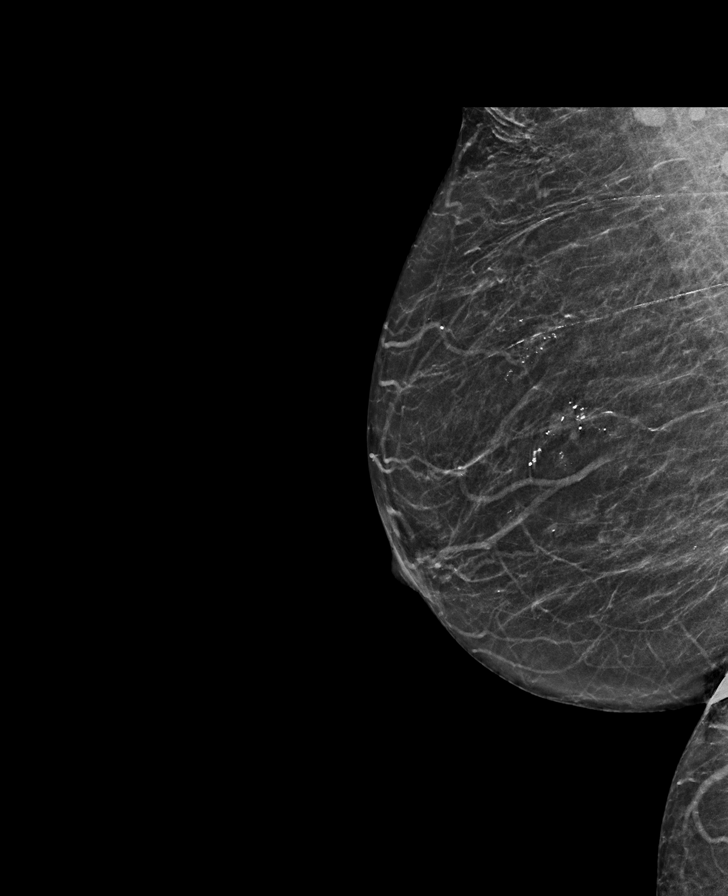

[L CC synth-2D]
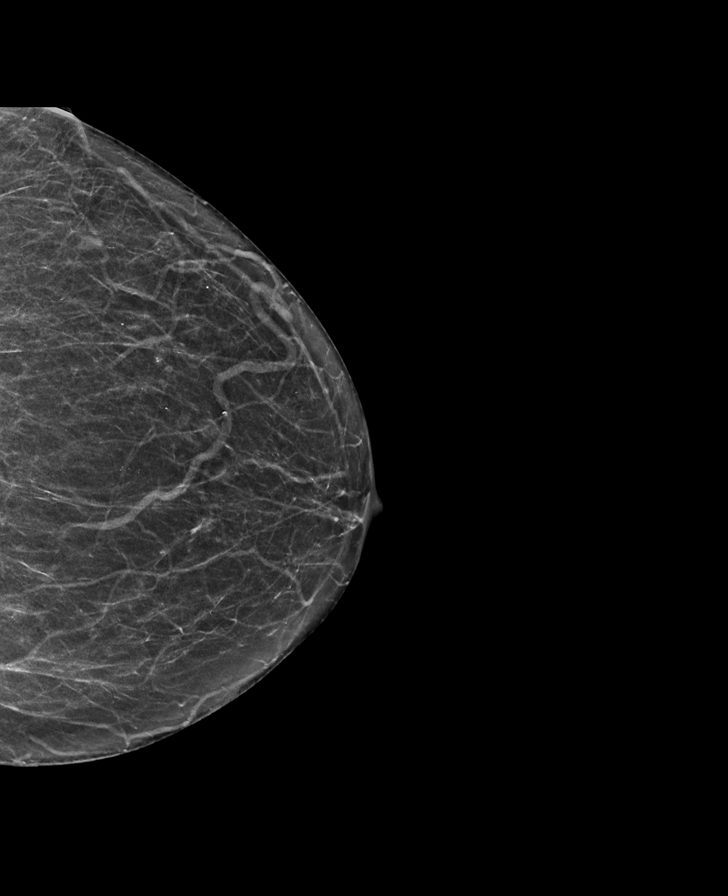

[R CC synth-2D]
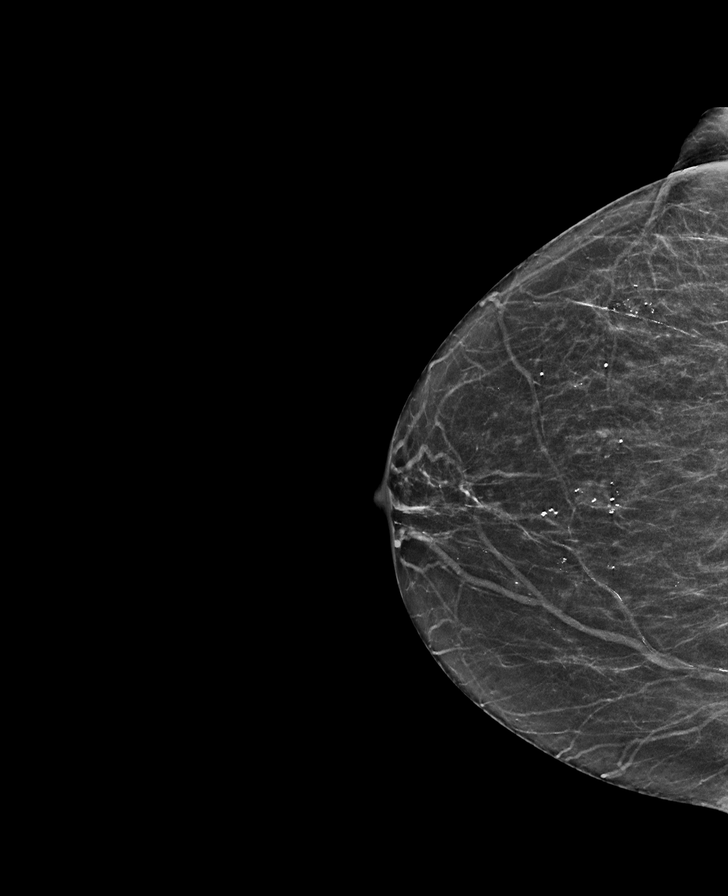

[R CC tomo · tomo slice 28/55.0]
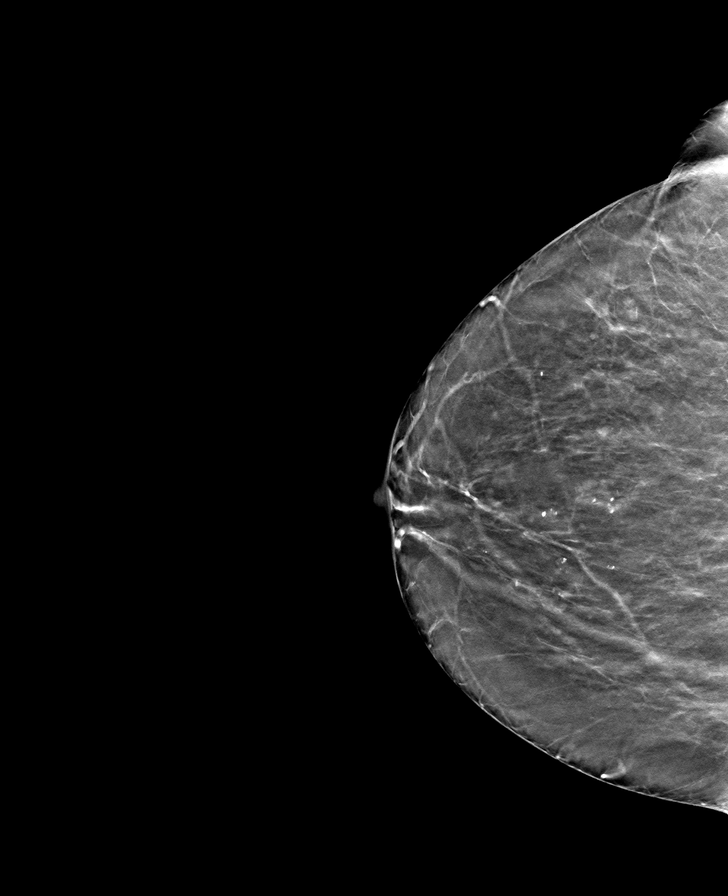

[L MLO tomo · tomo slice 31/60.0]
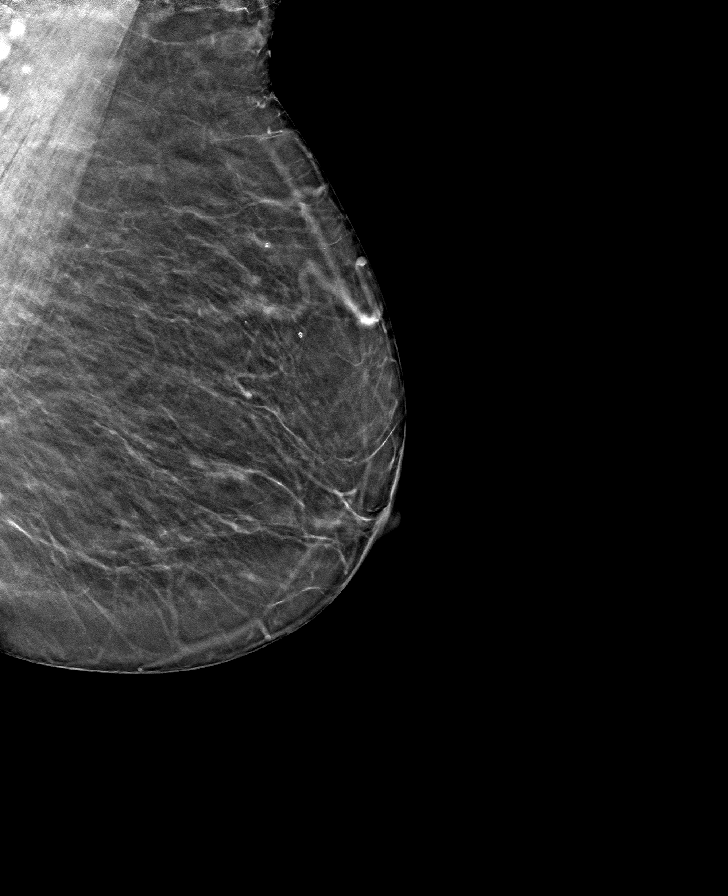

[R MLO tomo · tomo slice 30/59.0]
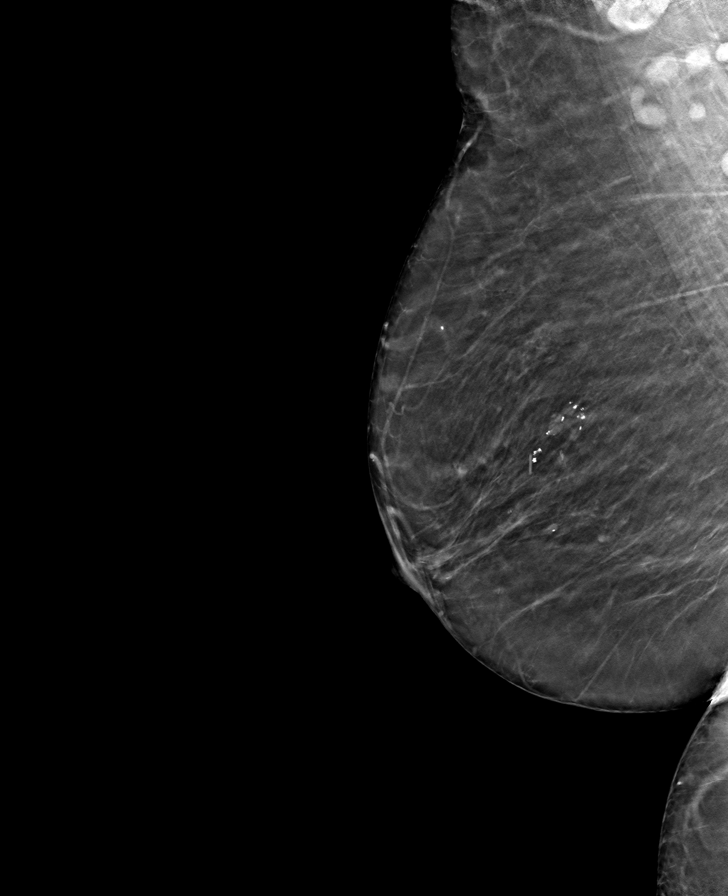

[L CC tomo · tomo slice 29/57.0]
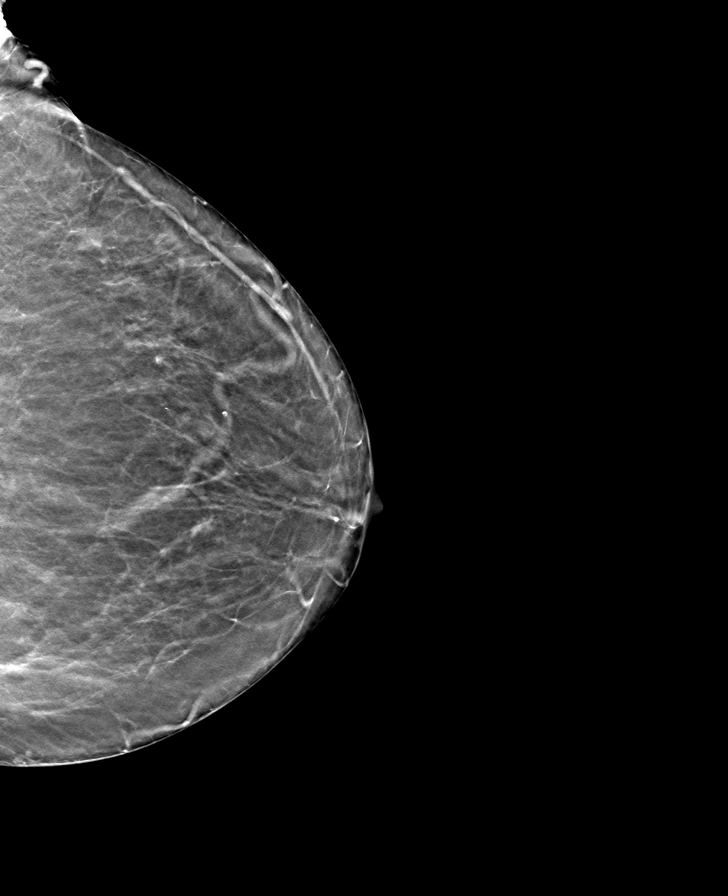

[8 of 24 positions shown; findings below may reference images not displayed]

ACR Breast Density Category b: There are scattered areas of
fibroglandular density.
FINDINGS: In the right breast, calcifications warrant further evaluation with
magnified views. In the left breast, no findings suspicious for
malignancy. Images were processed with CAD.
IMPRESSION: Further evaluation is suggested for calcifications in the right
breast.

RECOMMENDATION:
Diagnostic mammogram of the right breast. (Code:[8P])

The patient will be contacted regarding the findings, and additional
imaging will be scheduled.

BI-RADS CATEGORY  0: Incomplete. Need additional imaging evaluation
and/or prior mammograms for comparison.

## 2018-06-22 ENCOUNTER — Other Ambulatory Visit: Payer: Self-pay | Admitting: Internal Medicine

## 2018-06-22 DIAGNOSIS — R928 Other abnormal and inconclusive findings on diagnostic imaging of breast: Secondary | ICD-10-CM

## 2018-07-03 ENCOUNTER — Other Ambulatory Visit: Payer: Self-pay | Admitting: Internal Medicine

## 2018-07-03 ENCOUNTER — Ambulatory Visit
Admission: RE | Admit: 2018-07-03 | Discharge: 2018-07-03 | Disposition: A | Discharge status: Home / Self Care | Payer: Medicare HMO | Source: Ambulatory Visit | Attending: Internal Medicine | Admitting: Internal Medicine

## 2018-07-03 DIAGNOSIS — R928 Other abnormal and inconclusive findings on diagnostic imaging of breast: Secondary | ICD-10-CM | POA: Diagnosis not present

## 2018-07-03 DIAGNOSIS — R921 Mammographic calcification found on diagnostic imaging of breast: Secondary | ICD-10-CM

## 2018-07-03 IMAGING — MG DIGITAL DIAGNOSTIC UNILATERAL RIGHT MAMMOGRAM
3 series · 3 of 3 positions shown · non-contrast
Comparison: Previous exam(s).

CLINICAL DATA: Patient was called back from screening mammogram for
right breast calcifications.

EXAM:
DIGITAL DIAGNOSTIC RIGHT MAMMOGRAM WITH CAD

[R ML (1 of 2)]
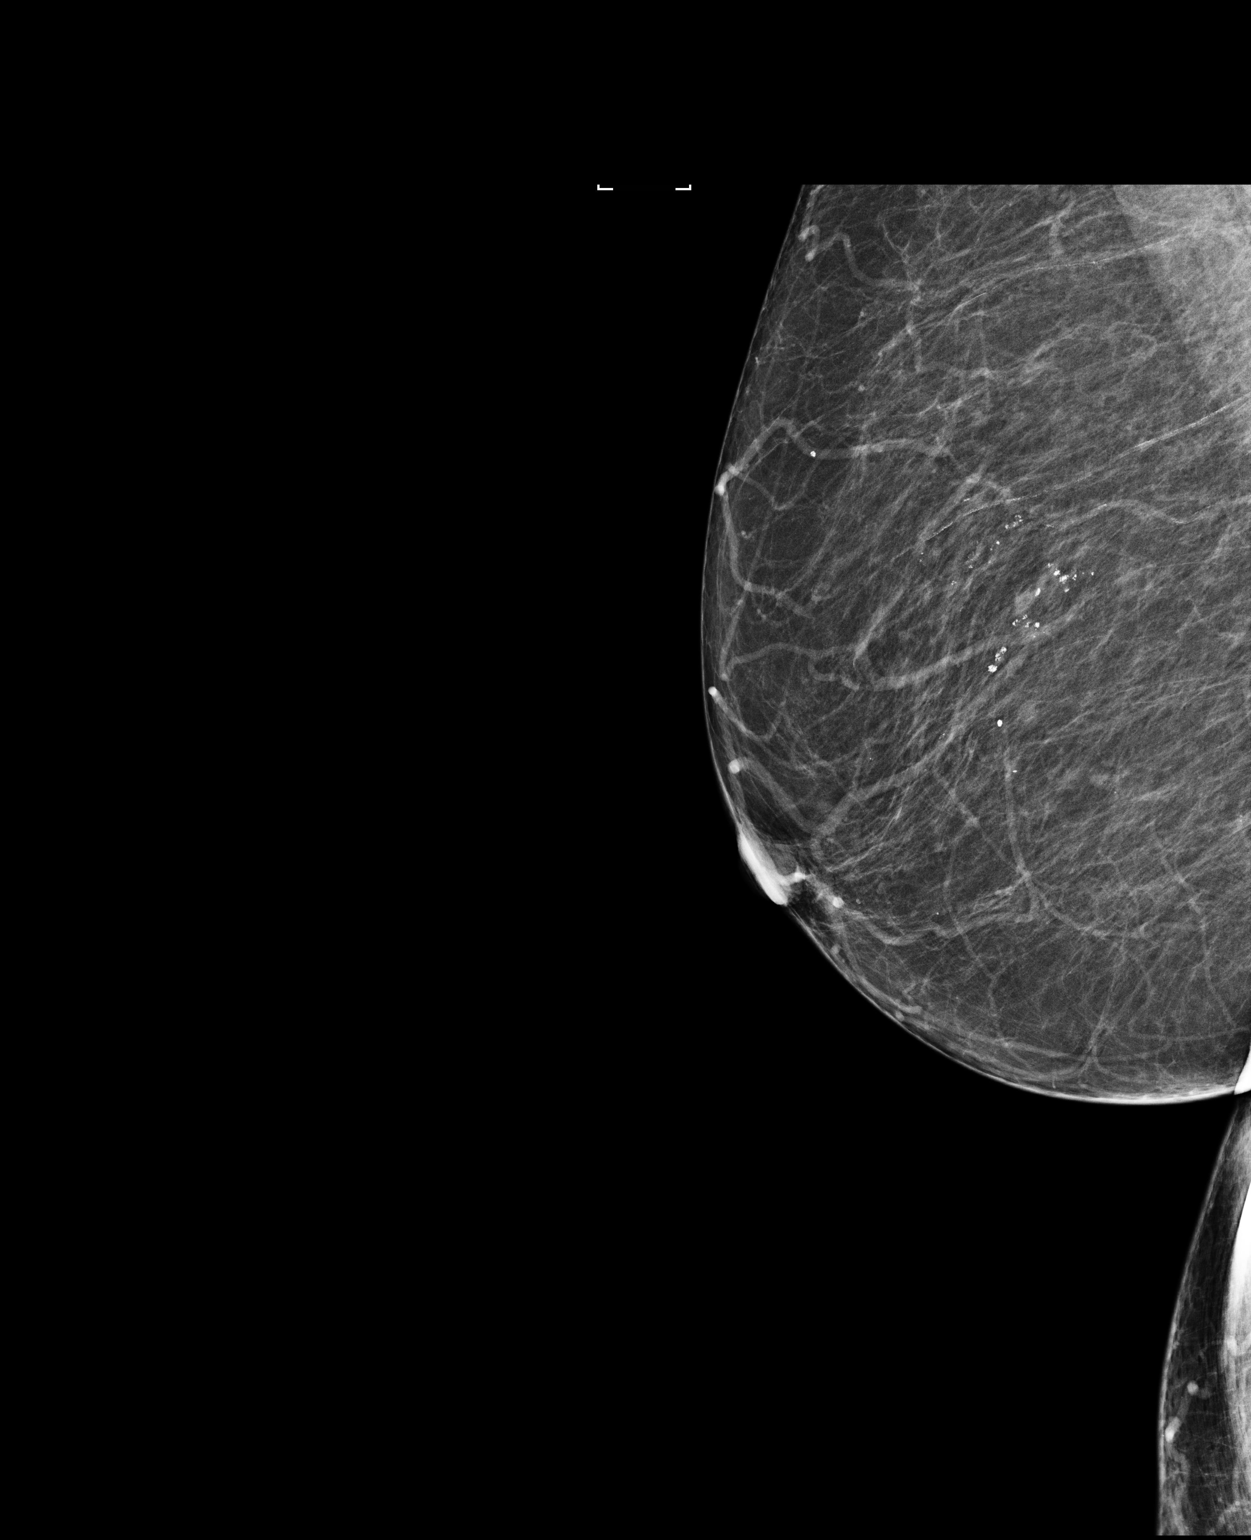

[R CC]
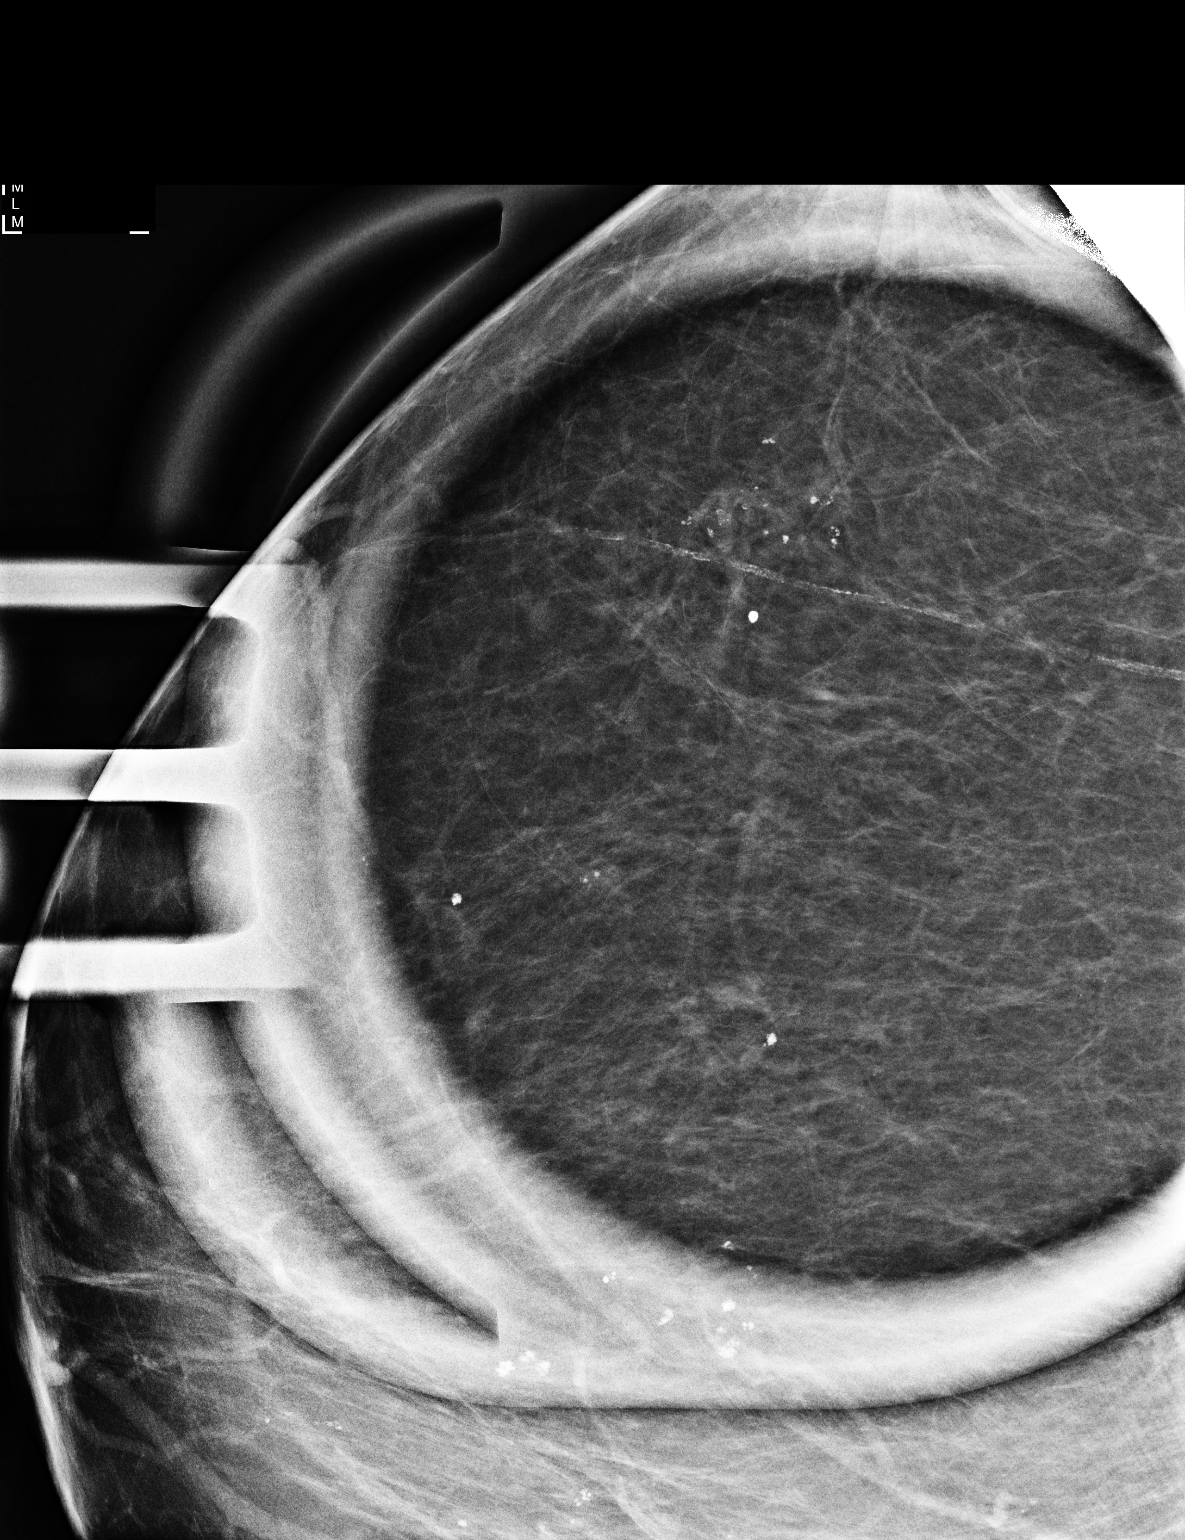

[R ML (2 of 2)]
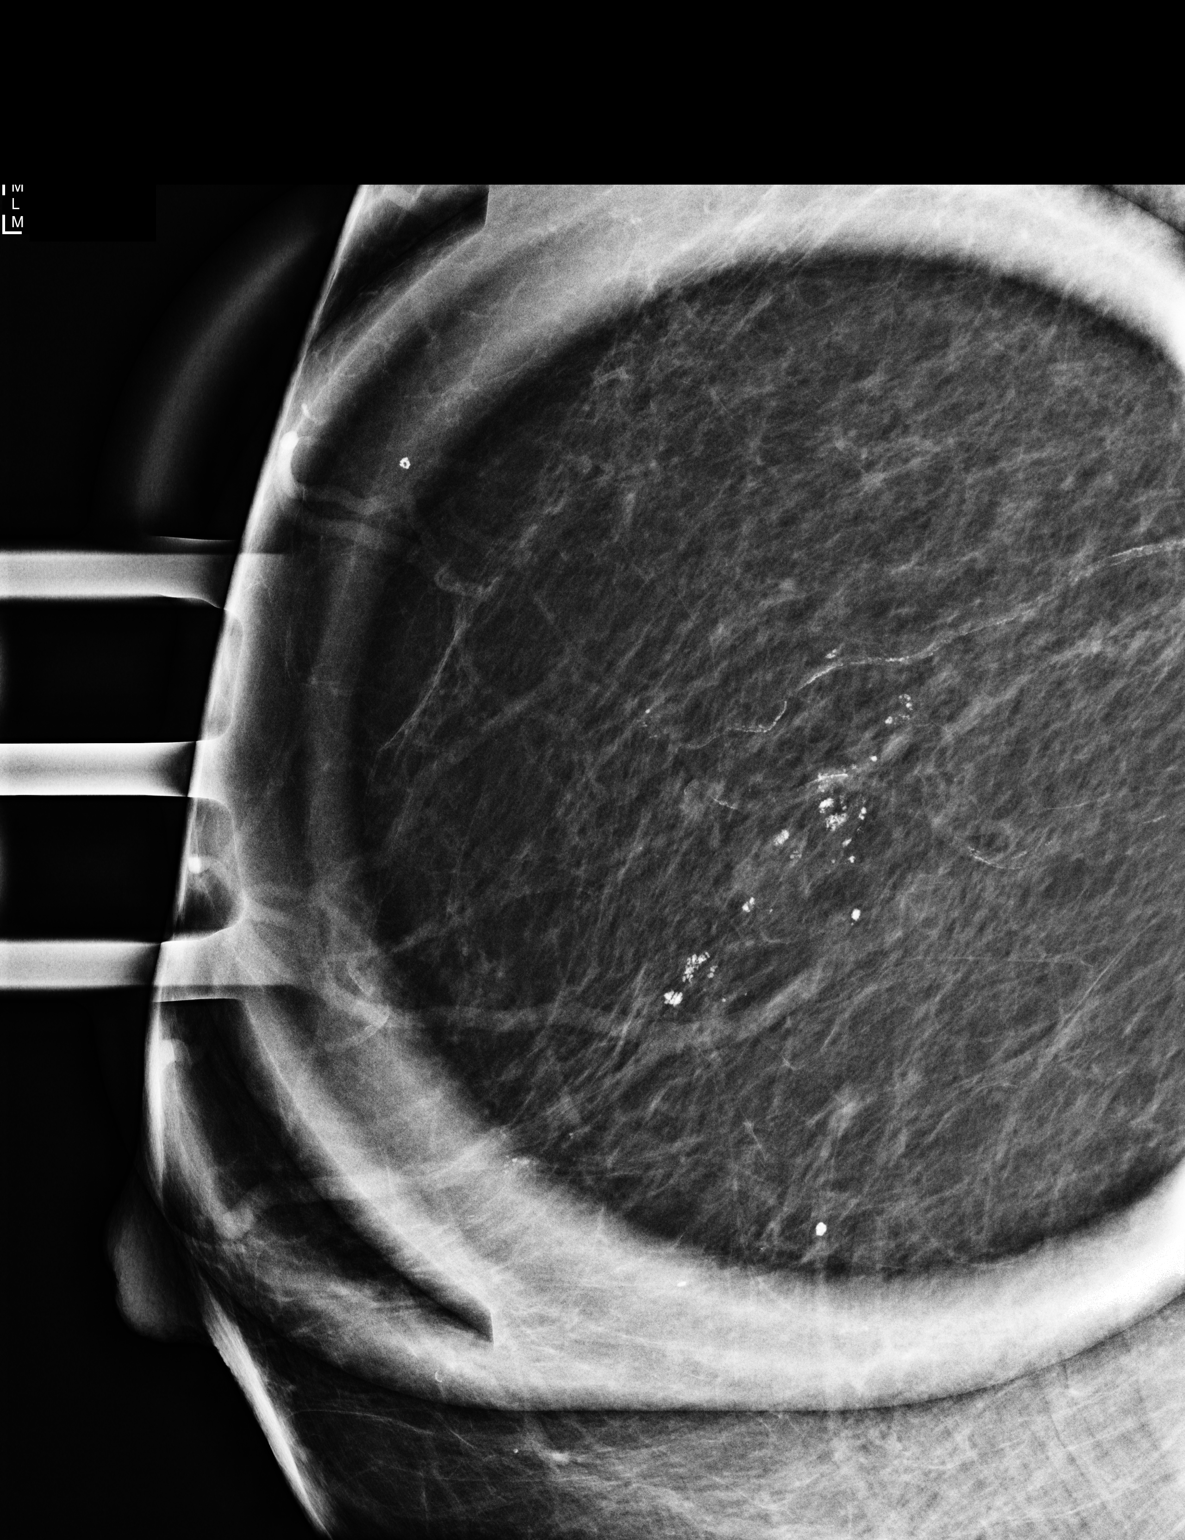

[3 of 3 positions shown; findings below may reference images not displayed]

ACR Breast Density Category b: There are scattered areas of
fibroglandular density.
FINDINGS: Additional imaging of the right breast was performed. In the
upper-outer quadrant of the right breast there are loosely grouped
coarse dystrophic appearing calcifications spanning 1.4 cm. There
are similar calcifications in the 12 o'clock region of the breast.
There are felt to likely be benign.

Mammographic images were processed with CAD.
IMPRESSION: Probable benign dystrophic calcifications in the right breast.

RECOMMENDATION:
Short-term interval follow-up right mammogram in 6 months is
recommended.

I have discussed the findings and recommendations with the patient.
Results were also provided in writing at the conclusion of the
visit. If applicable, a reminder letter will be sent to the patient
regarding the next appointment.

BI-RADS CATEGORY  3: Probably benign.

## 2018-09-19 ENCOUNTER — Other Ambulatory Visit: Payer: Self-pay | Admitting: Internal Medicine

## 2018-11-17 ENCOUNTER — Other Ambulatory Visit: Payer: Self-pay

## 2018-11-17 ENCOUNTER — Ambulatory Visit: Payer: No Typology Code available for payment source | Admitting: Internal Medicine

## 2018-11-18 ENCOUNTER — Encounter: Payer: No Typology Code available for payment source | Admitting: Internal Medicine

## 2018-11-18 ENCOUNTER — Encounter: Payer: Self-pay | Admitting: Internal Medicine

## 2018-11-18 ENCOUNTER — Other Ambulatory Visit: Payer: Self-pay

## 2018-11-18 ENCOUNTER — Ambulatory Visit (INDEPENDENT_AMBULATORY_CARE_PROVIDER_SITE_OTHER): Payer: Medicare HMO | Admitting: Internal Medicine

## 2018-11-18 VITALS — Ht 61.0 in

## 2018-11-18 DIAGNOSIS — E6609 Other obesity due to excess calories: Secondary | ICD-10-CM | POA: Diagnosis not present

## 2018-11-18 DIAGNOSIS — Z89421 Acquired absence of other right toe(s): Secondary | ICD-10-CM | POA: Diagnosis not present

## 2018-11-18 DIAGNOSIS — Z23 Encounter for immunization: Secondary | ICD-10-CM

## 2018-11-18 DIAGNOSIS — I10 Essential (primary) hypertension: Secondary | ICD-10-CM

## 2018-11-18 DIAGNOSIS — E2839 Other primary ovarian failure: Secondary | ICD-10-CM | POA: Diagnosis not present

## 2018-11-18 DIAGNOSIS — I739 Peripheral vascular disease, unspecified: Secondary | ICD-10-CM | POA: Diagnosis not present

## 2018-11-18 DIAGNOSIS — M869 Osteomyelitis, unspecified: Secondary | ICD-10-CM | POA: Diagnosis not present

## 2018-11-18 MED ORDER — ALBUTEROL SULFATE HFA 108 (90 BASE) MCG/ACT IN AERS: 2.0000 | INHALATION_SPRAY | RESPIRATORY_TRACT | 0 refills | Status: DC | PRN

## 2018-11-18 MED ORDER — PNEUMOCOCCAL 13-VAL CONJ VACC IM SUSP: 0.5000 mL | INTRAMUSCULAR | 0 refills | Status: AC

## 2018-11-18 MED ORDER — HYDROXYZINE HCL 10 MG PO TABS: 10.0000 mg | ORAL_TABLET | Freq: Three times a day (TID) | ORAL | 0 refills | Status: DC | PRN

## 2018-11-18 NOTE — Progress Notes (Signed)
Virtual Visit via Video   This visit type was conducted due to national recommendations for restrictions regarding the COVID-19 Pandemic (e.g. social distancing) in an effort to limit this patient's exposure and mitigate transmission in our community.  Due to her co-morbid illnesses, this patient is at least at moderate risk for complications without adequate follow up.  This format is felt to be most appropriate for this patient at this time.  All issues noted in this document were discussed and addressed.  A limited physical exam was performed with this format.    This visit type was conducted due to national recommendations for restrictions regarding the COVID-19 Pandemic (e.g. social distancing) in an effort to limit this patient's exposure and mitigate transmission in our community.  Patients identity confirmed using two different identifiers.  This format is felt to be most appropriate for this patient at this time.  All issues noted in this document were discussed and addressed.  No physical exam was performed (except for noted visual exam findings with Video Visits).    Date:  11/18/2018   ID:  Debbie Barry, Debbie Barry 1950-08-28, MRN [id number]  Patient Location:  Home  Provider location:   Office    Chief Complaint:  Blood pressure check  History of Present Illness:    Debbie Barry is a 68 y.o. female who presents via video conferencing for a telehealth visit today.    The patient does not have symptoms concerning for COVID-19 infection (fever, chills, cough, or new shortness of breath).   She presents today for virtual visit. She prefers this method of contact due to COVID-19 pandemic.  She has been working from home since March 19th. She is happy that she does not have to commute to [place] at this time. She admits that she has not been here in a long time. She reports office visits interfere with her work schedule. Her last visit was April 2019.   Hypertension  This  is a chronic problem. The current episode started more than 1 year ago. Pertinent negatives include no blurred vision, chest pain, palpitations or shortness of breath. Risk factors for coronary artery disease include obesity, sedentary lifestyle and post-menopausal state. Past treatments include ACE inhibitors, calcium channel blockers and diuretics. The current treatment provides mild improvement.     Past Medical History:  Diagnosis Date  . Fatty liver   . GERD (gastroesophageal reflux disease)   . Hypertension   . Osteomyelitis ([facility])    right third toe  . Peripheral vascular disease [facility])    Past Surgical History:  Procedure Laterality Date  . AMPUTATION Right 08/08/2017   Procedure: AMPUTATION RIGHT 3RD TOE;  Surgeon: Newt Minion, MD;  Location: [facility];  Service: Orthopedics;  Laterality: Right;  . Bunionectomy Right 2006     Right  . Bunionectony Left 2006      August  . COLONOSCOPY N/A 01/21/2014   Procedure: COLONOSCOPY;  Surgeon: Beryle Beams, MD;  [facility];  Service: Endoscopy;  Laterality: N/A;  . TUBAL LIGATION    . WISDOM TOOTH EXTRACTION       Current Meds  Medication Sig  . aspirin EC 325 MG tablet Take 325 mg by mouth daily.  . carvedilol (COREG) 12.5 MG tablet TAKE 1 TABLET BY MOUTH TWICE DAILY WITH FOOD  . Cholecalciferol (VITAMIN D3) 5000 units CAPS Take 5,000 Units by mouth daily.  . Multiple Vitamin (MULTIVITAMIN WITH MINERALS) TABS tablet Take 1 tablet by mouth daily.  Marland Kitchen  Potassium 99 MG TABS Take 99 mg by mouth daily.  . sertraline (ZOLOFT) 25 MG tablet TAKE 1 TABLET BY MOUTH EVERY DAY  . Tetrahydrozoline HCl (VISINE OP) Place 2 drops into both eyes daily as needed (for dry/irritated eyes).   . [DISCONTINUED] albuterol (PROVENTIL HFA;VENTOLIN HFA) 108 (90 Base) MCG/ACT inhaler Inhale 2 puffs into the lungs every 4 (four) hours as needed for wheezing or shortness of breath.  . [DISCONTINUED] hydrOXYzine (ATARAX/VISTARIL) 10 MG tablet Take 10 mg  by mouth every 8 (eight) hours as needed for itching.  . [DISCONTINUED] sulfamethoxazole-trimethoprim (BACTRIM DS,SEPTRA DS) 800-160 MG tablet Take 1 tablet by mouth 2 (two) times daily.     Allergies:   Patient has no known allergies.   Social History   Tobacco Use  . Smoking status: Never Smoker  . Smokeless tobacco: Never Used  Substance Use Topics  . Alcohol use: Yes    Comment: social glass of wine  . Drug use: No     Family Hx: The patient's family history includes Breast cancer in her mother; Colon cancer in her mother; Heart Problems in her mother; Heart disease in her father; Hypertension in her father and mother; Multiple myeloma in her father and mother.  ROS:   Please see the history of present illness.    Review of Systems  Constitutional: Negative.   Eyes: Negative for blurred vision.  Respiratory: Negative.  Negative for shortness of breath.   Cardiovascular: Negative.  Negative for chest pain and palpitations.  Gastrointestinal: Negative.   Neurological: Negative.   Psychiatric/Behavioral: Negative.     All other systems reviewed and are negative.   Labs/Other Tests and Data Reviewed:    Recent Labs: No results found for requested labs within last 8760 hours.   Recent Lipid Panel No results found for: CHOL, TRIG, HDL, CHOLHDL, LDLCALC, LDLDIRECT  Wt Readings from Last 3 Encounters:  09/25/17 180 lb (81.6 kg)  09/17/17 180 lb (81.6 kg)  08/20/17 180 lb (81.6 kg)     Exam:    Vital Signs:  Ht '5\' 1"'$  (1.549 m)   BMI 34.01 kg/m     Physical Exam  Constitutional: She is oriented to person, place, and time. She appears distressed.  HENT:  Head: Normocephalic and atraumatic.  Neck: Normal range of motion.  Pulmonary/Chest: Effort normal.  Neurological: She is alert and oriented to person, place, and time.  Psychiatric: Affect normal.  Nursing note and vitals reviewed.   ASSESSMENT & PLAN:     1. Essential hypertension, benign   Chronic.  Importance of dietary, medication and office visit compliance was discussed with the patient. She agrees to come in next week for bloodwork. I will also have her come in four months for a full physical examination.   - CMP14+EGFR; Future - CBC no Diff; Future  2. Decreased estrogen level  [facility].  She is encouraged to engage in weight-bearing exercises at least three days weekly.   - TSH; Future  3. Obesity due to excess calories without serious comorbidity, unspecified classification  Importance of achieving optimal weight to decrease risk of cardiovascular disease and cancers was discussed with the patient in full detail. She is encouraged to start slowly - start with 10 minutes twice daily at least three to four days per week and to gradually build to 30 minutes five days weekly. She was given tips to incorporate more activity into her daily routine - take stairs  when possible, park farther away from her job, grocery stores, etc.    4. Need for vaccination  Rx Prevnar-13 was sent to the pharmacy.     COVID-19 Education: The signs and symptoms of COVID-19 were discussed with the patient and how to seek care for testing (follow up with PCP or arrange E-visit).  The importance of social distancing was discussed today.  Patient Risk:   After full review of this patients clinical status, I feel that they are at least moderate risk at this time.  Time:   Today, I have spent 13 minutes/ 32 seconds with the patient with telehealth technology discussing above diagnoses.     Medication Adjustments/Labs and Tests Ordered: Current medicines are reviewed at length with the patient today.  Concerns regarding medicines are outlined above.   Tests Ordered: Orders Placed This Encounter  Procedures  . DG Bone Density  . CMP14+EGFR  . TSH  . CBC no Diff    Medication Changes: Meds ordered this encounter  Medications  . pneumococcal 13-valent  conjugate vaccine (PREVNAR 13) SUSP injection    Sig: Inject 0.5 mLs into the muscle tomorrow at 10 am for 1 dose.    Dispense:  1 Syringe    Refill:  0    Please send (fax) documentation to [phone]    Disposition:  Follow up in 4 month(s)  Signed, Maximino Greenland, MD

## 2018-11-18 NOTE — Patient Instructions (Signed)

## 2018-11-24 ENCOUNTER — Telehealth: Payer: Self-pay | Admitting: Internal Medicine

## 2018-11-24 NOTE — Telephone Encounter (Signed)
Returned patient call to schedule a lab appt  No answer lmovm to call office

## 2018-12-03 ENCOUNTER — Other Ambulatory Visit: Payer: Self-pay

## 2018-12-03 ENCOUNTER — Other Ambulatory Visit: Payer: Medicare HMO

## 2018-12-03 DIAGNOSIS — E2839 Other primary ovarian failure: Secondary | ICD-10-CM | POA: Diagnosis not present

## 2018-12-03 DIAGNOSIS — I10 Essential (primary) hypertension: Secondary | ICD-10-CM

## 2018-12-04 LAB — TSH: TSH: 4.37 u[IU]/mL (ref 0.450–4.500)

## 2018-12-04 LAB — CBC
Hematocrit: 43.7 % (ref 34.0–46.6)
Hemoglobin: 15.7 g/dL (ref 11.1–15.9)
MCH: 35.2 pg — ABNORMAL HIGH (ref 26.6–33.0)
MCHC: 35.9 g/dL — ABNORMAL HIGH (ref 31.5–35.7)
MCV: 98 fL — ABNORMAL HIGH (ref 79–97)
Platelets: 183 10*3/uL (ref 150–450)
RBC: 4.46 x10E6/uL (ref 3.77–5.28)
RDW: 11.8 % (ref 11.7–15.4)
WBC: 5.6 10*3/uL (ref 3.4–10.8)

## 2018-12-04 LAB — CMP14+EGFR
ALT: 33 IU/L — ABNORMAL HIGH (ref 0–32)
AST: 51 IU/L — ABNORMAL HIGH (ref 0–40)
Albumin/Globulin Ratio: 1.3 (ref 1.2–2.2)
Albumin: 3.6 g/dL — ABNORMAL LOW (ref 3.8–4.8)
Alkaline Phosphatase: 156 IU/L — ABNORMAL HIGH (ref 39–117)
BUN/Creatinine Ratio: 7 — ABNORMAL LOW (ref 12–28)
BUN: 5 mg/dL — ABNORMAL LOW (ref 8–27)
Bilirubin Total: 0.5 mg/dL (ref 0.0–1.2)
CO2: 26 mmol/L (ref 20–29)
Calcium: 9.5 mg/dL (ref 8.7–10.3)
Chloride: 96 mmol/L (ref 96–106)
Creatinine, Ser: 0.67 mg/dL (ref 0.57–1.00)
GFR calc Af Amer: 107 mL/min/{1.73_m2} (ref >59)
GFR calc non Af Amer: 93 mL/min/{1.73_m2} (ref >59)
Globulin, Total: 2.8 g/dL (ref 1.5–4.5)
Glucose: 108 mg/dL — ABNORMAL HIGH (ref 65–99)
Potassium: 3.8 mmol/L (ref 3.5–5.2)
Sodium: 139 mmol/L (ref 134–144)
Total Protein: 6.4 g/dL (ref 6.0–8.5)

## 2018-12-09 LAB — GAMMA GT

## 2018-12-09 LAB — SPECIMEN STATUS REPORT

## 2019-01-25 ENCOUNTER — Ambulatory Visit
Admission: RE | Admit: 2019-01-25 | Discharge: 2019-01-25 | Disposition: A | Discharge status: Home / Self Care | Payer: Medicare HMO | Source: Ambulatory Visit | Attending: Internal Medicine | Admitting: Internal Medicine

## 2019-01-25 ENCOUNTER — Other Ambulatory Visit: Payer: Self-pay

## 2019-01-25 DIAGNOSIS — R921 Mammographic calcification found on diagnostic imaging of breast: Secondary | ICD-10-CM | POA: Diagnosis not present

## 2019-01-25 IMAGING — MG DIGITAL DIAGNOSTIC UNILATERAL RIGHT MAMMOGRAM WITH TOMO AND CAD
6 series · 6 of 14 positions shown · non-contrast
Comparison: Previous exam(s).

CLINICAL DATA: Follow-up probably benign calcifications in the
upper-outer right breast. Her mother was diagnosed with breast
cancer in her 70s.

EXAM:
DIGITAL DIAGNOSTIC UNILATERAL RIGHT MAMMOGRAM WITH CAD AND TOMO

[R CC]
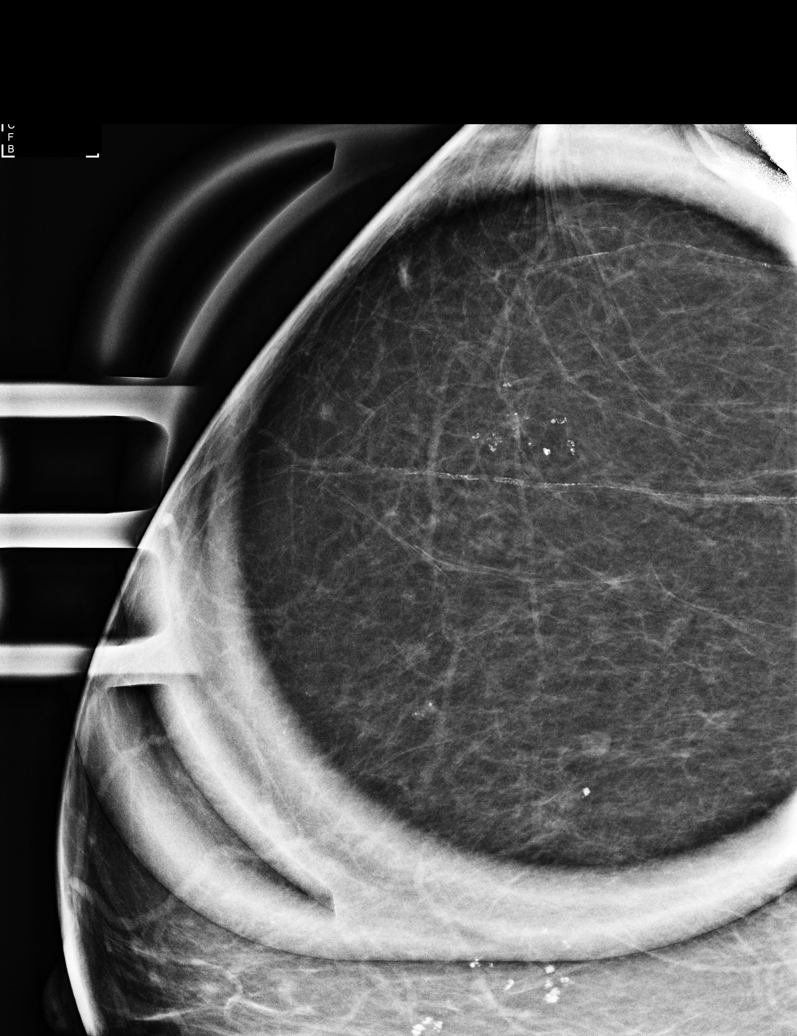

[R ML]
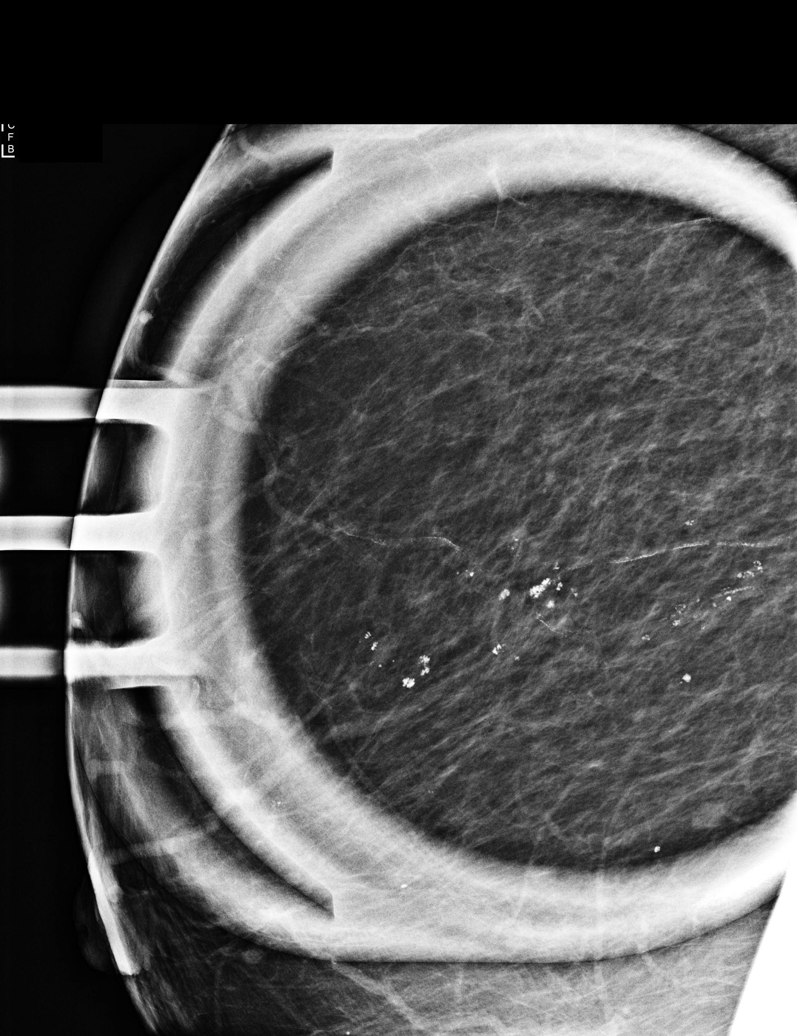

[R CC synth-2D]
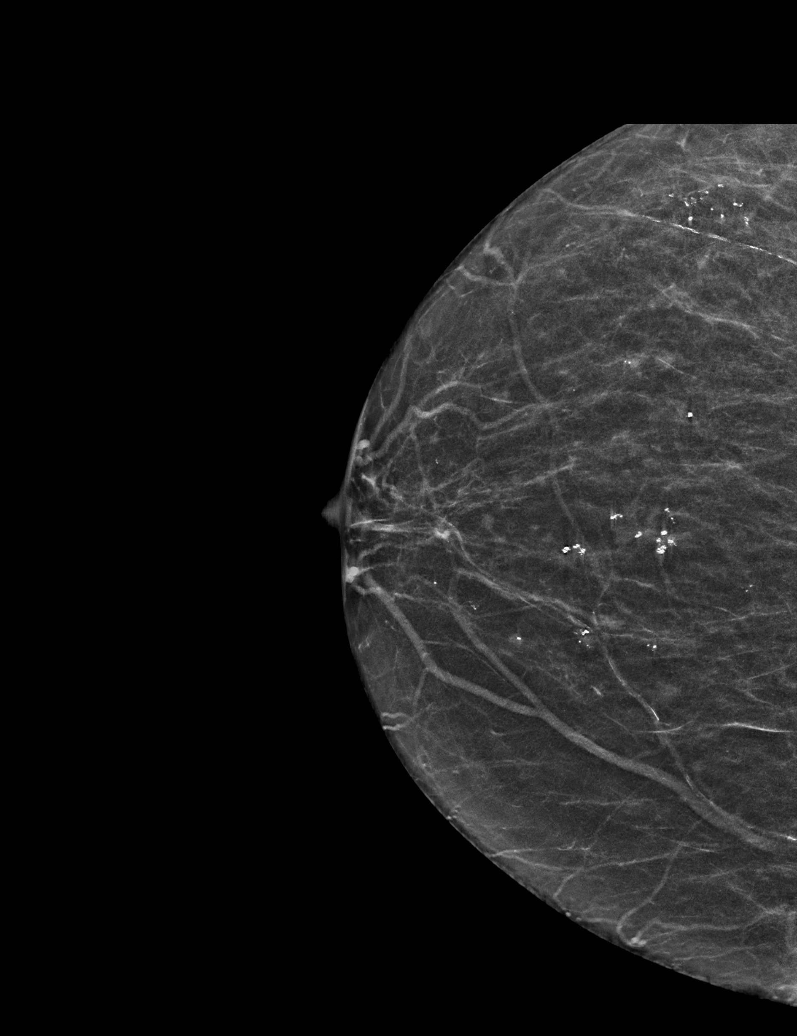

[R MLO synth-2D]
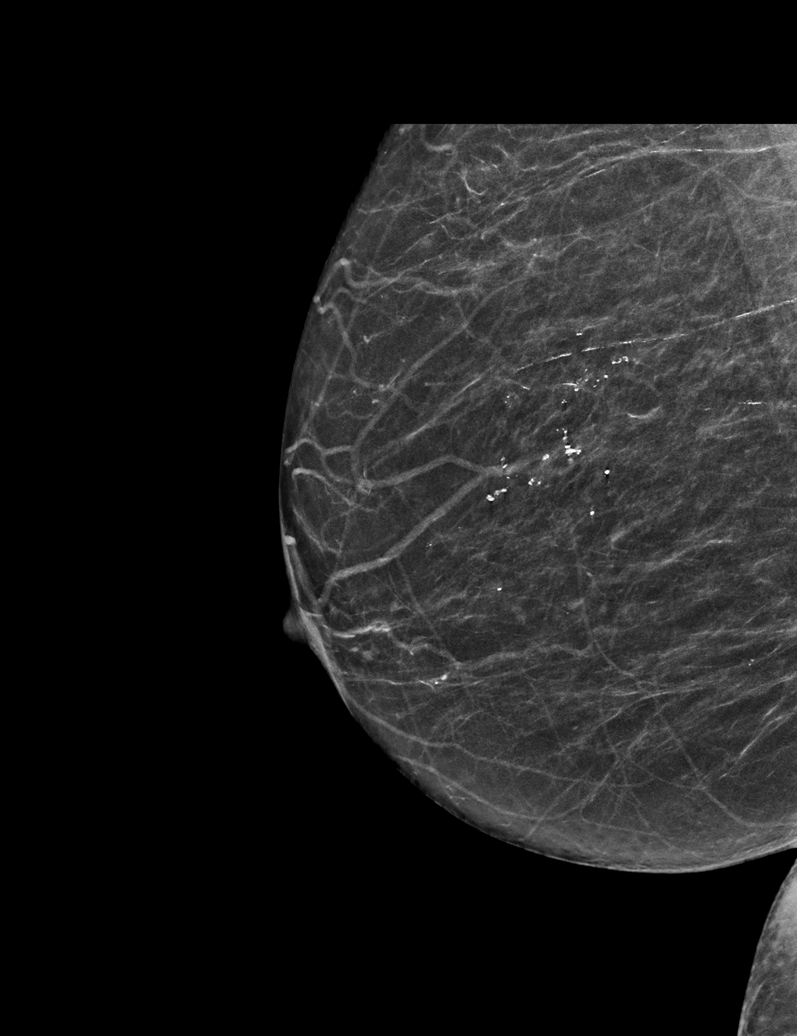

[R CC tomo · tomo slice 24/47.0]
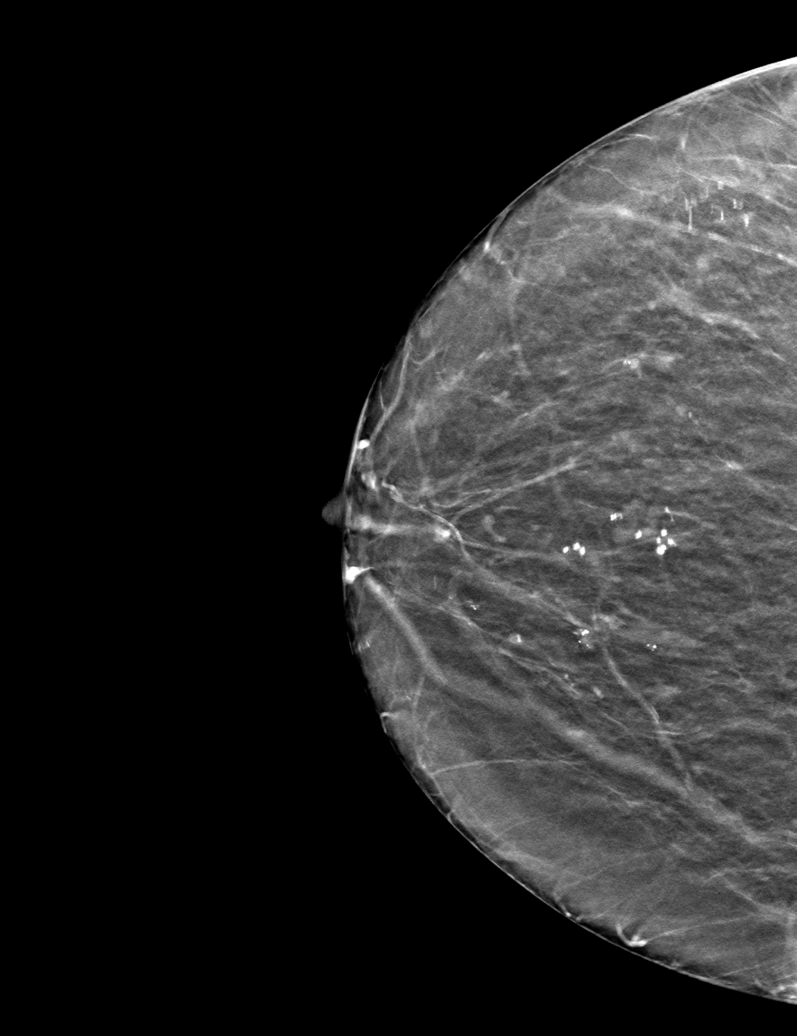

[R MLO tomo · tomo slice 27/52.0]
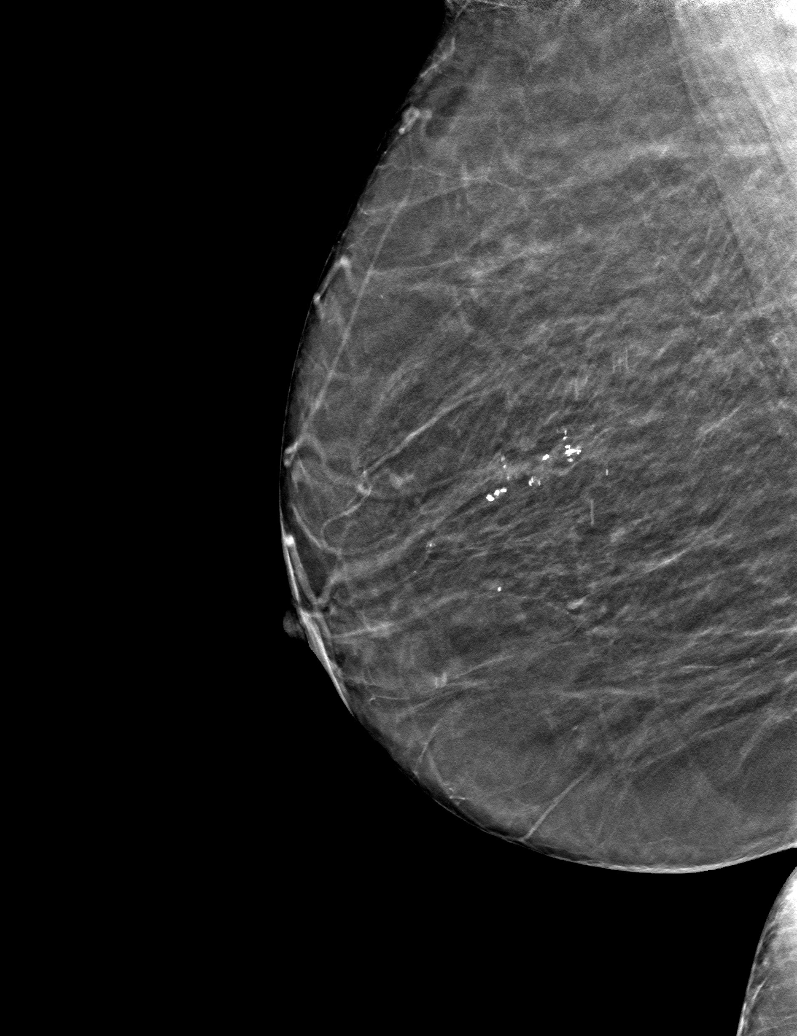

[6 of 14 positions shown; findings below may reference images not displayed]

ACR Breast Density Category b: There are scattered areas of
fibroglandular density.
FINDINGS: The previously described probably benign, dystrophic calcifications
in the upper-outer right breast have a more coalescent, dystrophic
appearance today, compatible with fibroadenomatoid changes. There
are no interval findings suspicious for malignancy in the breast.

Mammographic images were processed with CAD.
IMPRESSION: The previously demonstrated probably benign right breast
calcifications have more typically benign features today. These do
not require further follow-up. No evidence of malignancy.

RECOMMENDATION:
Bilateral screening mammogram in 5 months when due. That will be 1
year since mammographic evaluation of the left breast.

I have discussed the findings and recommendations with the patient.
Results were also provided in writing at the conclusion of the
visit. If applicable, a reminder letter will be sent to the patient
regarding the next appointment.

BI-RADS CATEGORY  2: Benign.

## 2019-01-26 ENCOUNTER — Other Ambulatory Visit: Payer: Medicare HMO

## 2019-01-26 ENCOUNTER — Other Ambulatory Visit: Payer: Self-pay

## 2019-01-26 ENCOUNTER — Telehealth: Payer: Self-pay

## 2019-01-26 MED ORDER — CARVEDILOL 12.5 MG PO TABS: 12.5000 mg | ORAL_TABLET | Freq: Two times a day (BID) | ORAL | 1 refills | Status: DC

## 2019-01-26 MED ORDER — SERTRALINE HCL 25 MG PO TABS: 25.0000 mg | ORAL_TABLET | Freq: Every day | ORAL | 1 refills | Status: DC

## 2019-01-26 NOTE — Telephone Encounter (Signed)
Left the pt a message that her refills for Sertraline and carvedilol was faxed to [facility] on [address].

## 2019-02-01 ENCOUNTER — Ambulatory Visit: Payer: No Typology Code available for payment source | Admitting: Internal Medicine

## 2019-02-05 ENCOUNTER — Telehealth: Payer: Self-pay

## 2019-02-05 NOTE — Telephone Encounter (Signed)
LVM for pt to call to have [facility] appt she missed rescheduled before 06/01/2019 02/05/2019

## 2019-03-23 ENCOUNTER — Other Ambulatory Visit: Payer: Self-pay

## 2019-03-23 ENCOUNTER — Ambulatory Visit (INDEPENDENT_AMBULATORY_CARE_PROVIDER_SITE_OTHER): Payer: Medicare HMO | Admitting: Nurse Practitioner

## 2019-03-23 ENCOUNTER — Encounter: Payer: Self-pay | Admitting: Nurse Practitioner

## 2019-03-23 VITALS — BP 138/100 | HR 88 | Temp 98.7°F | Ht 60.6 in | Wt 183.8 lb

## 2019-03-23 DIAGNOSIS — Z6835 Body mass index (BMI) 35.0-35.9, adult: Secondary | ICD-10-CM | POA: Diagnosis not present

## 2019-03-23 DIAGNOSIS — E6609 Other obesity due to excess calories: Secondary | ICD-10-CM | POA: Diagnosis not present

## 2019-03-23 DIAGNOSIS — Z0000 Encounter for general adult medical examination without abnormal findings: Secondary | ICD-10-CM

## 2019-03-23 DIAGNOSIS — Z23 Encounter for immunization: Secondary | ICD-10-CM | POA: Diagnosis not present

## 2019-03-23 DIAGNOSIS — I10 Essential (primary) hypertension: Secondary | ICD-10-CM | POA: Diagnosis not present

## 2019-03-23 LAB — POCT URINALYSIS DIPSTICK
Blood, UA: NEGATIVE
Glucose, UA: NEGATIVE
Ketones, UA: NEGATIVE
Nitrite, UA: NEGATIVE
Protein, UA: NEGATIVE
Spec Grav, UA: 1.02 (ref 1.010–1.025)
Urobilinogen, UA: 1 E.U./dL
pH, UA: 7 (ref 5.0–8.0)

## 2019-03-23 LAB — POCT UA - MICROALBUMIN
Albumin/Creatinine Ratio, Urine, POC: 30
Creatinine, POC: 200 mg/dL
Microalbumin Ur, POC: 10 mg/L

## 2019-03-23 NOTE — Progress Notes (Signed)
Subjective:     Patient ID: Debbie Barry , female    DOB: 09-18-1950 , 68 y.o.   MRN: [id number]   Chief Complaint  Patient presents with  . Medicare Wellness    HPI  Here for Welcome to Medicare  No GYN or PAP -     Past Medical History:  Diagnosis Date  . Fatty liver   . GERD (gastroesophageal reflux disease)   . Hypertension   . Osteomyelitis ([place])    right third toe  . Peripheral vascular disease ([place])      Family History  Problem Relation Age of Onset  . Breast cancer Mother   . Colon cancer Mother   . Heart Problems Mother   . Hypertension Mother   . Multiple myeloma Mother   . Hypertension Father   . Heart disease Father   . Multiple myeloma Father      Current Outpatient Medications:  .  aspirin EC 325 MG tablet, Take 325 mg by mouth daily., Disp: , Rfl:  .  carvedilol (COREG) 12.5 MG tablet, Take 1 tablet (12.5 mg total) by mouth 2 (two) times daily with a meal., Disp: 180 tablet, Rfl: 1 .  Cholecalciferol (VITAMIN D3) 5000 units CAPS, Take 5,000 Units by mouth daily., Disp: , Rfl:  .  Multiple Vitamin (MULTIVITAMIN WITH MINERALS) TABS tablet, Take 1 tablet by mouth daily., Disp: , Rfl:  .  Potassium 99 MG TABS, Take 99 mg by mouth daily., Disp: , Rfl:  .  sertraline (ZOLOFT) 25 MG tablet, Take 1 tablet (25 mg total) by mouth daily., Disp: 90 tablet, Rfl: 1 .  Tetrahydrozoline HCl (VISINE OP), Place 2 drops into both eyes daily as needed (for dry/irritated eyes). , Disp: , Rfl:  .  Neomycin-Bacitracin-Polymyxin (TRIPLE ANTIBIOTIC) [phone] OINT, Apply 1 application topically 3 (three) times daily., Disp: , Rfl:    No Known Allergies   Review of Systems  Constitutional: Negative.   HENT: Negative.   Eyes: Negative.   Respiratory: Negative.   Cardiovascular: Negative.   Gastrointestinal: Negative.   Endocrine: Negative.   Genitourinary: Negative.   Musculoskeletal: Negative.   Skin: Negative.   Allergic/Immunologic: Negative.    Neurological: Negative.  Negative for dizziness and headaches.  Hematological: Negative.   Psychiatric/Behavioral: Negative.      Today's Vitals   03/23/19 1441  BP: (!) 138/100  Pulse: 88  Temp: 98.7 F (37.1 C)  TempSrc: Oral  Weight: 183 lb 12.8 oz (83.4 kg)  Height: 5' 0.6" (1.539 m)  PainSc: 0-No pain   Body mass index is 35.19 kg/m.   Objective:  Physical Exam Constitutional:      General: She is not in acute distress.    Appearance: Normal appearance. She is well-developed. She is obese.  HENT:     Head: Normocephalic and atraumatic.     Right Ear: Hearing, tympanic membrane, ear canal and external ear normal. There is no impacted cerumen.     Left Ear: Hearing, tympanic membrane, ear canal and external ear normal. There is no impacted cerumen.  Eyes:     General: Lids are normal.     Conjunctiva/sclera: Conjunctivae normal.     Pupils: Pupils are equal, round, and reactive to light.     Funduscopic exam:    Right eye: No papilledema.        Left eye: No papilledema.  Neck:     Musculoskeletal: Full passive range of motion without pain, normal range of motion and neck  supple.     Thyroid: No thyroid mass.     Vascular: No carotid bruit.  Cardiovascular:     Rate and Rhythm: Normal rate and regular rhythm.     Pulses: Normal pulses.     Heart sounds: Normal heart sounds. No murmur.  Pulmonary:     Effort: Pulmonary effort is normal.     Breath sounds: Normal breath sounds.  Abdominal:     General: Abdomen is flat. Bowel sounds are normal.     Palpations: Abdomen is soft.  Musculoskeletal: Normal range of motion.        General: No swelling.     Right lower leg: No edema.     Left lower leg: No edema.  Skin:    General: Skin is warm and dry.     Capillary Refill: Capillary refill takes less than 2 seconds.  Neurological:     General: No focal deficit present.     Mental Status: She is alert and oriented to person, place, and time.     Cranial Nerves:  No cranial nerve deficit.     Sensory: No sensory deficit.  Psychiatric:        Mood and Affect: Mood normal.        Behavior: Behavior normal.        Thought Content: Thought content normal.        Judgment: Judgment normal.         Assessment And Plan:     1. Medicare annual wellness visit, initial   Pt's annual wellness exam was performed and geriatric assessment reviewed.   Pt has no new identiafble wellness concerns at this time.   WIll obtain routine labs.   Obtained UA and micro.   Behavior modifications discussed and diet history reviewed. Pt will continue to exercise regularly and modify diet, with low GI, plant based foods and decrease food intake of processed foods.   Recommend intake of daily multivitamin, Vitamin D, and calcium.  Recommend mammogram (01/25/2019) for preventive screenings, as well as recommend immunizations that include influenza given today and TDAP (up to date)  2. Essential hypertension, benign  Blood pressure is elevated, advised to take her blood pressure at home  EKG - NSR negative T waves anterior ischemia HR 75 - POCT Urinalysis Dipstick (81002) - POCT UA - Microalbumin - EKG 12-Lead  3. Need for influenza vaccination  Influenza vaccine given in office  Advised to take Tylenol as needed for muscle aches or fever - Flu vaccine HIGH DOSE PF (Fluzone High dose)  4. Encounter for immunization  Pneumonia 23 vaccine given in office and sent Rx for shingrix to pharmacy and is advised to wait about one month before going to receive. - Pneumococcal polysaccharide vaccine 23-valent greater than or equal to 2yo subcutaneous/IM - Varicella-zoster vaccine IM (Shingrix)  5. Obesity due to excess calories without serious comorbidity, unspecified classification Chronic Discussed healthy diet and regular exercise options  Encouraged to exercise at least 150 minutes per week with 2 days of strength training as tolerated  6. BMI 35.0-35.9,adult   See #5      Minette Brine, FNP    THE PATIENT IS ENCOURAGED TO PRACTICE SOCIAL DISTANCING DUE TO THE COVID-19 PANDEMIC.     Subjective:    Debbie Barry is a 68 y.o. female who presents for a Welcome to Medicare exam.   Review of Systems See above        Objective:    Today's Vitals  03/23/19 1441  BP: (!) 138/100  Pulse: 88  Temp: 98.7 F (37.1 C)  TempSrc: Oral  Weight: 183 lb 12.8 oz (83.4 kg)  Height: 5' 0.6" (1.539 m)  PainSc: 0-No pain  Body mass index is 35.19 kg/m.  Medications Outpatient Encounter Medications as of 03/23/2019  Medication Sig  . aspirin EC 325 MG tablet Take 325 mg by mouth daily.  . carvedilol (COREG) 12.5 MG tablet Take 1 tablet (12.5 mg total) by mouth 2 (two) times daily with a meal.  . Cholecalciferol (VITAMIN D3) 5000 units CAPS Take 5,000 Units by mouth daily.  . Multiple Vitamin (MULTIVITAMIN WITH MINERALS) TABS tablet Take 1 tablet by mouth daily.  . Potassium 99 MG TABS Take 99 mg by mouth daily.  . sertraline (ZOLOFT) 25 MG tablet Take 1 tablet (25 mg total) by mouth daily.  . Tetrahydrozoline HCl (VISINE OP) Place 2 drops into both eyes daily as needed (for dry/irritated eyes).   . Neomycin-Bacitracin-Polymyxin (TRIPLE ANTIBIOTIC) [phone] OINT Apply 1 application topically 3 (three) times daily.  . [DISCONTINUED] albuterol (VENTOLIN HFA) 108 (90 Base) MCG/ACT inhaler Inhale 2 puffs into the lungs every 4 (four) hours as needed for wheezing or shortness of breath. (Patient not taking: Reported on 11/18/2018)  . [DISCONTINUED] cilostazol (PLETAL) 100 MG tablet Take 100 mg by mouth 2 (two) times daily.  . [DISCONTINUED] hydrOXYzine (ATARAX/VISTARIL) 10 MG tablet Take 1 tablet (10 mg total) by mouth every 8 (eight) hours as needed for itching. (Patient not taking: Reported on 11/18/2018)   No facility-administered encounter medications on file as of 03/23/2019.      History: Past Medical History:  Diagnosis Date  . Fatty  liver   . GERD (gastroesophageal reflux disease)   . Hypertension   . Osteomyelitis ([place])    right third toe  . Peripheral vascular disease [facility])    Past Surgical History:  Procedure Laterality Date  . AMPUTATION Right 08/08/2017   Procedure: AMPUTATION RIGHT 3RD TOE;  Surgeon: Newt Minion, MD;  Location: [facility];  Service: Orthopedics;  Laterality: Right;  . Bunionectomy Right 2006     Right  . Bunionectony Left 2006      August  . COLONOSCOPY N/A 01/21/2014   Procedure: COLONOSCOPY;  Surgeon: Beryle Beams, MD;  Location: [facility] END[facility];  Service: Endoscopy;  Laterality: N/A;  . TUBAL LIGATION    . WISDOM TOOTH EXTRACTION      Family History  Problem Relation Age of Onset  . Breast cancer Mother   . Colon cancer Mother   . Heart Problems Mother   . Hypertension Mother   . Multiple myeloma Mother   . Hypertension Father   . Heart disease Father   . Multiple myeloma Father    Social History   Occupational History  . Not on file  Tobacco Use  . Smoking status: Never Smoker  . Smokeless tobacco: Never Used  Substance and Sexual Activity  . Alcohol use: Yes    Comment: social glass of wine  . Drug use: No  . Sexual activity: Not on file    Tobacco Counseling Counseling given: Not Answered   Immunizations and Health Maintenance Immunization History  Administered Date(s) Administered  . Influenza, High Dose Seasonal PF 03/23/2019   Health Maintenance Due  Topic Date Due  . DEXA SCAN  06/15/2018    Activities of Daily Living In your present state of health, do you have any difficulty performing the following activities: 03/23/2019  Hearing? N  Vision? N  Difficulty concentrating or making decisions? N  Walking or climbing stairs? N  Dressing or bathing? N  Doing errands, shopping? N  Some recent data might be hidden    Physical Exam see above (optional), or other factors deemed appropriate based on the beneficiary's medical and social history and current  clinical standards.  Advanced Directives: Does Patient Have a Medical Advance Directive?: No    Assessment:    This is a routine wellness examination for this patient .  Vision/Hearing screen  Hearing Screening   '125Hz'$  '250Hz'$  '500Hz'$  '1000Hz'$  '2000Hz'$  '3000Hz'$  '4000Hz'$  '6000Hz'$  '8000Hz'$   Right ear:   40 20 20  40    Left ear:   '25 25 20  25      '$ Visual Acuity Screening   Right eye Left eye Both eyes  Without correction:     With correction: '25/50 20/50 20/50 '$    Dietary issues and exercise activities discussed:     Goals    . Weight (lb) < 200 lb (90.7 kg)     "I would like to lose weight"      Depression Screen PHQ 2/9 Scores 03/23/2019 11/18/2018  PHQ - 2 Score 0 0     Fall Risk Fall Risk  03/23/2019  Falls in the past year? 0    Cognitive Function:     6CIT Screen 03/23/2019  What Year? 0 points  What month? 0 points  What time? 0 points  Count back from 20 0 points  Months in reverse 0 points  Repeat phrase 0 points  Total Score 0    Patient Care Team: Glendale Chard, MD as PCP - General (Internal Medicine)     Plan:    I have personally reviewed and noted the following in the patient's chart:   . Medical and social history . Use of alcohol, tobacco or illicit drugs  . Current medications and supplements . Functional ability and status . Nutritional status . Physical activity . Advanced directives . List of other physicians . Hospitalizations, surgeries, and ER visits in previous 12 months . Vitals . Screenings to include cognitive, depression, and falls . Referrals and appointments  In addition, I have reviewed and discussed with patient certain preventive protocols, quality metrics, and best practice recommendations. A written personalized care plan for preventive services as well as general preventive health recommendations were provided to patient.     Minette Brine, FNP 03/23/2019

## 2019-03-23 NOTE — Patient Instructions (Signed)
Advance Directive  Advance directives are legal documents that let you make choices ahead of time about your health care and medical treatment in case you become unable to communicate for yourself. Advance directives are a way for you to communicate your wishes to family, friends, and health care providers. This can help convey your decisions about end-of-life care if you become unable to communicate. Discussing and writing advance directives should happen over time rather than all at once. Advance directives can be changed depending on your situation and what you want, even after you have signed the advance directives. If you do not have an advance directive, some states assign family decision makers to act on your behalf based on how closely you are related to them. Each state has its own laws regarding advance directives. You may want to check with your health care provider, attorney, or state representative about the laws in your state. There are different types of advance directives, such as:  Medical power of attorney.  Living will.  Do not resuscitate (DNR) or do not attempt resuscitation (DNAR) order. Health care proxy and medical power of attorney A health care proxy, also called a health care agent, is a person who is appointed to make medical decisions for you in cases in which you are unable to make the decisions yourself. Generally, people choose someone they know well and trust to represent their preferences. Make sure to ask this person for an agreement to act as your proxy. A proxy may have to exercise judgment in the event of a medical decision for which your wishes are not known. A medical power of attorney is a legal document that names your health care proxy. Depending on the laws in your state, after the document is written, it may also need to be:  Signed.  Notarized.  Dated.  Copied.  Witnessed.  Incorporated into your medical record. You may also want to appoint  someone to manage your financial affairs in a situation in which you are unable to do so. This is called a durable power of attorney for finances. It is a separate legal document from the durable power of attorney for health care. You may choose the same person or someone different from your health care proxy to act as your agent in financial matters. If you do not appoint a proxy, or if there is a concern that the proxy is not acting in your best interests, a court-appointed guardian may be designated to act on your behalf. Living will A living will is a set of instructions documenting your wishes about medical care when you cannot express them yourself. Health care providers should keep a copy of your living will in your medical record. You may want to give a copy to family members or friends. To alert caregivers in case of an emergency, you can place a card in your wallet to let them know that you have a living will and where they can find it. A living will is used if you become:  Terminally ill.  Incapacitated.  Unable to communicate or make decisions. Items to consider in your living will include:  The use or non-use of life-sustaining equipment, such as dialysis machines and breathing machines (ventilators).  A DNR or DNAR order, which is the instruction not to use cardiopulmonary resuscitation (CPR) if breathing or heartbeat stops.  The use or non-use of tube feeding.  Withholding of food and fluids.  Comfort (palliative) care when the goal becomes comfort rather  than a cure.  Organ and tissue donation. A living will does not give instructions for distributing your money and property if you should pass away. It is recommended that you seek the advice of a lawyer when writing a will. Decisions about taxes, beneficiaries, and asset distribution will be legally binding. This process can relieve your family and friends of any concerns surrounding disputes or questions that may come up about  the distribution of your assets. DNR or DNAR A DNR or DNAR order is a request not to have CPR in the event that your heart stops beating or you stop breathing. If a DNR or DNAR order has not been made and shared, a health care provider will try to help any patient whose heart has stopped or who has stopped breathing. If you plan to have surgery, talk with your health care provider about how your DNR or DNAR order will be followed if problems occur. Summary  Advance directives are the legal documents that allow you to make choices ahead of time about your health care and medical treatment in case you become unable to communicate for yourself.  The process of discussing and writing advance directives should happen over time. You can change the advance directives, even after you have signed them.  Advance directives include DNR or DNAR orders, living wills, and designating an agent as your medical power of attorney. This information is not intended to replace advice given to you by your health care provider. Make sure you discuss any questions you have with your health care provider. Document Released: 09/24/2007 Document Revised: 07/22/2018 Document Reviewed: 05/06/2016 Elsevier Patient Education  2020 [name].  Debbie Barry , Thank you for taking time to come for your Medicare Wellness Visit. I appreciate your ongoing commitment to your health goals. Please review the following plan we discussed and let me know if I can assist you in the future.   These are the goals we discussed: Goals    . Weight (lb) < 200 lb (90.7 kg)     "I would like to lose weight"       This is a list of the screening recommended for you and due dates:  Health Maintenance  Topic Date Due  . DEXA scan (bone density measurement)  06/15/2018  . Pap Smear  03/23/2019*  . HIV Screening  11/18/2019*  . Pneumonia vaccines (2 of 2 - PPSV23) 03/22/2020  . Mammogram  07/03/2020  . Colon Cancer Screening  01/22/2024   . Tetanus Vaccine  10/15/2025  . Flu Shot  Completed  .  Hepatitis C: One time screening is recommended by Center for Disease Control  (CDC) for  adults born from [birth year] through 1963.   Completed  *Topic was postponed. The date shown is not the original due date.

## 2019-03-24 LAB — CMP14 + ANION GAP
ALT: 26 IU/L (ref 0–32)
AST: 48 IU/L — ABNORMAL HIGH (ref 0–40)
Albumin/Globulin Ratio: 1.1 — ABNORMAL LOW (ref 1.2–2.2)
Albumin: 3.4 g/dL — ABNORMAL LOW (ref 3.8–4.8)
Alkaline Phosphatase: 143 IU/L — ABNORMAL HIGH (ref 39–117)
Anion Gap: 18 mmol/L (ref 10.0–18.0)
BUN/Creatinine Ratio: 6 — ABNORMAL LOW (ref 12–28)
BUN: 4 mg/dL — ABNORMAL LOW (ref 8–27)
Bilirubin Total: 0.5 mg/dL (ref 0.0–1.2)
CO2: 21 mmol/L (ref 20–29)
Calcium: 8.7 mg/dL (ref 8.7–10.3)
Chloride: 100 mmol/L (ref 96–106)
Creatinine, Ser: 0.66 mg/dL (ref 0.57–1.00)
GFR calc Af Amer: 107 mL/min/{1.73_m2} (ref >59)
GFR calc non Af Amer: 93 mL/min/{1.73_m2} (ref >59)
Globulin, Total: 3 g/dL (ref 1.5–4.5)
Glucose: 99 mg/dL (ref 65–99)
Potassium: 4.4 mmol/L (ref 3.5–5.2)
Sodium: 139 mmol/L (ref 134–144)
Total Protein: 6.4 g/dL (ref 6.0–8.5)

## 2019-03-24 LAB — CBC
Hematocrit: 44.6 % (ref 34.0–46.6)
Hemoglobin: 15.6 g/dL (ref 11.1–15.9)
MCH: 34.8 pg — ABNORMAL HIGH (ref 26.6–33.0)
MCHC: 35 g/dL (ref 31.5–35.7)
MCV: 100 fL — ABNORMAL HIGH (ref 79–97)
Platelets: 167 10*3/uL (ref 150–450)
RBC: 4.48 x10E6/uL (ref 3.77–5.28)
RDW: 12.6 % (ref 11.7–15.4)
WBC: 6.4 10*3/uL (ref 3.4–10.8)

## 2019-03-24 LAB — LIPID PANEL
Chol/HDL Ratio: 2.4 ratio (ref 0.0–4.4)
Cholesterol, Total: 177 mg/dL (ref 100–199)
HDL: 75 mg/dL (ref >39)
LDL Chol Calc (NIH): 74 mg/dL (ref 0–99)
Triglycerides: 169 mg/dL — ABNORMAL HIGH (ref 0–149)
VLDL Cholesterol Cal: 28 mg/dL (ref 5–40)

## 2019-03-30 MED ORDER — SHINGRIX 50 MCG/0.5ML IM SUSR: 0.5000 mL | Freq: Once | INTRAMUSCULAR | 0 refills | Status: AC

## 2019-07-06 ENCOUNTER — Ambulatory Visit: Payer: Self-pay | Admitting: Internal Medicine

## 2019-09-04 DIAGNOSIS — H43393 Other vitreous opacities, bilateral: Secondary | ICD-10-CM | POA: Diagnosis not present

## 2019-09-04 DIAGNOSIS — Z0100 Encounter for examination of eyes and vision without abnormal findings: Secondary | ICD-10-CM | POA: Diagnosis not present

## 2019-09-04 DIAGNOSIS — H524 Presbyopia: Secondary | ICD-10-CM | POA: Diagnosis not present

## 2019-09-18 ENCOUNTER — Other Ambulatory Visit: Payer: Self-pay | Admitting: Internal Medicine

## 2019-12-14 ENCOUNTER — Ambulatory Visit: Payer: Medicare HMO | Admitting: Internal Medicine

## 2019-12-22 ENCOUNTER — Other Ambulatory Visit: Payer: Self-pay | Admitting: Internal Medicine

## 2019-12-22 ENCOUNTER — Other Ambulatory Visit: Payer: Self-pay

## 2019-12-22 ENCOUNTER — Encounter: Payer: Self-pay | Admitting: Internal Medicine

## 2019-12-22 ENCOUNTER — Ambulatory Visit (INDEPENDENT_AMBULATORY_CARE_PROVIDER_SITE_OTHER): Payer: Medicare HMO | Admitting: Internal Medicine

## 2019-12-22 VITALS — BP 164/102 | HR 98 | Temp 98.1°F | Ht 59.8 in | Wt 173.8 lb

## 2019-12-22 DIAGNOSIS — Z6834 Body mass index (BMI) 34.0-34.9, adult: Secondary | ICD-10-CM | POA: Diagnosis not present

## 2019-12-22 DIAGNOSIS — I739 Peripheral vascular disease, unspecified: Secondary | ICD-10-CM | POA: Diagnosis not present

## 2019-12-22 DIAGNOSIS — Z79899 Other long term (current) drug therapy: Secondary | ICD-10-CM

## 2019-12-22 DIAGNOSIS — M869 Osteomyelitis, unspecified: Secondary | ICD-10-CM | POA: Diagnosis not present

## 2019-12-22 DIAGNOSIS — R002 Palpitations: Secondary | ICD-10-CM | POA: Diagnosis not present

## 2019-12-22 DIAGNOSIS — R55 Syncope and collapse: Secondary | ICD-10-CM

## 2019-12-22 DIAGNOSIS — E6609 Other obesity due to excess calories: Secondary | ICD-10-CM | POA: Diagnosis not present

## 2019-12-22 DIAGNOSIS — R748 Abnormal levels of other serum enzymes: Secondary | ICD-10-CM | POA: Diagnosis not present

## 2019-12-22 DIAGNOSIS — I10 Essential (primary) hypertension: Secondary | ICD-10-CM | POA: Diagnosis not present

## 2019-12-22 DIAGNOSIS — E2839 Other primary ovarian failure: Secondary | ICD-10-CM

## 2019-12-22 DIAGNOSIS — R945 Abnormal results of liver function studies: Secondary | ICD-10-CM | POA: Diagnosis not present

## 2019-12-22 DIAGNOSIS — Z89421 Acquired absence of other right toe(s): Secondary | ICD-10-CM | POA: Diagnosis not present

## 2019-12-22 MED ORDER — AMLODIPINE BESYLATE 5 MG PO TABS: 5.0000 mg | ORAL_TABLET | Freq: Every day | ORAL | 1 refills | Status: DC

## 2019-12-22 MED ORDER — SERTRALINE HCL 25 MG PO TABS: 25.0000 mg | ORAL_TABLET | Freq: Every day | ORAL | 1 refills | Status: DC

## 2019-12-22 NOTE — Patient Instructions (Signed)

## 2019-12-23 ENCOUNTER — Telehealth: Payer: Self-pay

## 2019-12-23 ENCOUNTER — Other Ambulatory Visit: Payer: Self-pay

## 2019-12-23 ENCOUNTER — Telehealth: Payer: Self-pay | Admitting: Cardiovascular Disease

## 2019-12-23 LAB — CBC
Hematocrit: 49.4 % — ABNORMAL HIGH (ref 34.0–46.6)
Hemoglobin: 16.9 g/dL — ABNORMAL HIGH (ref 11.1–15.9)
MCH: 35.4 pg — ABNORMAL HIGH (ref 26.6–33.0)
MCHC: 34.2 g/dL (ref 31.5–35.7)
MCV: 104 fL — ABNORMAL HIGH (ref 79–97)
Platelets: 153 10*3/uL (ref 150–450)
RBC: 4.77 x10E6/uL (ref 3.77–5.28)
RDW: 13.2 % (ref 11.7–15.4)
WBC: 4.6 10*3/uL (ref 3.4–10.8)

## 2019-12-23 LAB — CMP14+EGFR
ALT: 29 IU/L (ref 0–32)
AST: 50 IU/L — ABNORMAL HIGH (ref 0–40)
Albumin/Globulin Ratio: 1.4 (ref 1.2–2.2)
Albumin: 3.7 g/dL — ABNORMAL LOW (ref 3.8–4.8)
Alkaline Phosphatase: 162 IU/L — ABNORMAL HIGH (ref 48–121)
BUN/Creatinine Ratio: 8 — ABNORMAL LOW (ref 12–28)
BUN: 5 mg/dL — ABNORMAL LOW (ref 8–27)
Bilirubin Total: 0.5 mg/dL (ref 0.0–1.2)
CO2: 25 mmol/L (ref 20–29)
Calcium: 8.8 mg/dL (ref 8.7–10.3)
Chloride: 96 mmol/L (ref 96–106)
Creatinine, Ser: 0.6 mg/dL (ref 0.57–1.00)
GFR calc Af Amer: 110 mL/min/{1.73_m2} (ref >59)
GFR calc non Af Amer: 95 mL/min/{1.73_m2} (ref >59)
Globulin, Total: 2.7 g/dL (ref 1.5–4.5)
Glucose: 74 mg/dL (ref 65–99)
Potassium: 2.9 mmol/L — ABNORMAL LOW (ref 3.5–5.2)
Sodium: 141 mmol/L (ref 134–144)
Total Protein: 6.4 g/dL (ref 6.0–8.5)

## 2019-12-23 LAB — MAGNESIUM: Magnesium: 1.5 mg/dL — ABNORMAL LOW (ref 1.6–2.3)

## 2019-12-23 LAB — TSH: TSH: 3.05 u[IU]/mL (ref 0.450–4.500)

## 2019-12-23 LAB — VITAMIN B12: Vitamin B-12: >2000 pg/mL — ABNORMAL HIGH (ref 232–1245)

## 2019-12-23 MED ORDER — MAGNESIUM 400 MG PO CAPS: ORAL_CAPSULE | ORAL | 1 refills | Status: DC

## 2019-12-23 MED ORDER — POTASSIUM CHLORIDE CRYS ER 20 MEQ PO TBCR: 20.0000 meq | EXTENDED_RELEASE_TABLET | Freq: Every day | ORAL | 0 refills | Status: DC

## 2019-12-23 NOTE — Telephone Encounter (Signed)
I attempted to contact patient on 12/23/19 to schedule initial appt with Dr. Oval Linsey. The patient didn't answer, I left message for patient to return call to get appt scheduled.

## 2019-12-23 NOTE — Telephone Encounter (Signed)
-----   Message from Glendale Chard, MD sent at 12/23/2019 10:39 AM EDT ----- Here are your lab results:  Your vitamin B12 level is elevated. This implies that your liver is not functioning properly.   Your liver and kidney function are normal. However, your potassium level is quite low. I will have someone send in potassium chloride 40meq once daily into your pharmacy, #5, 0 refills. I will recheck your potassium at your next visit.  Please take as directed. Your hemoglobin is quite elevated. Do you drink alcohol? IF yes, how much?   Your magnesium level is quite low as well. I will have someone send rx magnesium 400mg  once daily in the evenings, #90/0 refills.   Please let me know if you have any questions or concerns. Stay safe!   Sincerely,    Robyn N. Baird Cancer, MD

## 2019-12-24 LAB — SPECIMEN STATUS REPORT

## 2019-12-24 LAB — GAMMA GT: GGT: 100 IU/L — ABNORMAL HIGH (ref 0–60)

## 2019-12-28 ENCOUNTER — Telehealth: Payer: Self-pay

## 2019-12-28 ENCOUNTER — Other Ambulatory Visit: Payer: Self-pay

## 2019-12-28 MED ORDER — THIAMINE HCL 100 MG PO TABS: 100.0000 mg | ORAL_TABLET | Freq: Every day | ORAL | 1 refills | Status: DC

## 2019-12-28 NOTE — Progress Notes (Signed)
This visit occurred during the SARS-CoV-2 public health emergency.  Safety protocols were in place, including screening questions prior to the visit, additional usage of staff PPE, and extensive cleaning of exam room while observing appropriate contact time as indicated for disinfecting solutions.  Subjective:     Patient ID: Debbie Barry , female    DOB: [birth year] , 69 y.o.   MRN: [id number]   Chief Complaint  Patient presents with  . Hypertension    HPI  She is here today for BP check. Reports compliance with meds; however, has yet to take her meds today. States she was rushing to the appt today.  Has missed several appts b/c she has difficulty getting off from work.   She adds that she had syncopal episode about 3 weeks ago, on 6/2.  She reports she had just gotten out of the shower, sat on the bed, was putting on her underwear when she suddenly felt hot. She reports she looked up at the ceiling fan, which was on, and she remembers awakening on the floor. She states she fell onto her left side. She also hit the the left side of her head on the dresser. Her dog awakened her, she thinks she was out for about 15 minutes.   Hypertension This is a chronic problem. The current episode started more than 1 year ago. Associated symptoms include palpitations. Pertinent negatives include no blurred vision, chest pain or shortness of breath. Risk factors for coronary artery disease include obesity, sedentary lifestyle and post-menopausal state. Past treatments include ACE inhibitors, calcium channel blockers and diuretics. The current treatment provides mild improvement.     Past Medical History:  Diagnosis Date  . Fatty liver   . GERD (gastroesophageal reflux disease)   . Hypertension   . Osteomyelitis ([place])    right third toe  . Peripheral vascular disease ([place])      Family History  Problem Relation Age of Onset  . Breast cancer Mother   . Colon cancer Mother   . Heart Problems  Mother   . Hypertension Mother   . Multiple myeloma Mother   . Hypertension Father   . Heart disease Father   . Multiple myeloma Father      Current Outpatient Medications:  .  aspirin EC 325 MG tablet, Take 325 mg by mouth daily., Disp: , Rfl:  .  carvedilol (COREG) 12.5 MG tablet, Take 1 tablet (12.5 mg total) by mouth 2 (two) times daily with a meal., Disp: 180 tablet, Rfl: 1 .  Cholecalciferol (VITAMIN D3) 5000 units CAPS, Take 5,000 Units by mouth daily., Disp: , Rfl:  .  Multiple Vitamin (MULTIVITAMIN WITH MINERALS) TABS tablet, Take 1 tablet by mouth daily., Disp: , Rfl:  .  Potassium 99 MG TABS, Take 99 mg by mouth daily., Disp: , Rfl:  .  Tetrahydrozoline HCl (VISINE OP), Place 2 drops into both eyes daily as needed (for dry/irritated eyes). , Disp: , Rfl:  .  amLODipine (NORVASC) 5 MG tablet, TAKE 1 TABLET(5 MG) BY MOUTH DAILY, Disp: 90 tablet, Rfl: 1 .  Magnesium 400 MG CAPS, Take 1 capsule by mouth daily at bedtime, Disp: 90 capsule, Rfl: 1 .  potassium chloride SA (KLOR-CON) 20 MEQ tablet, Take 1 tablet (20 mEq total) by mouth daily., Disp: 5 tablet, Rfl: 0 .  sertraline (ZOLOFT) 25 MG tablet, Take 1 tablet (25 mg total) by mouth daily., Disp: 90 tablet, Rfl: 1 .  thiamine 100 MG tablet, Take 1 tablet (  100 mg total) by mouth daily., Disp: 90 tablet, Rfl: 1   No Known Allergies   Review of Systems  Constitutional: Negative.   Eyes: Negative for blurred vision.  Respiratory: Negative.  Negative for shortness of breath.   Cardiovascular: Positive for palpitations. Negative for chest pain.  Gastrointestinal: Negative.   Neurological: Positive for syncope.  Psychiatric/Behavioral: Negative.      Today's Vitals   12/22/19 1038  BP: (!) 164/102  Pulse: 98  Temp: 98.1 F (36.7 C)  TempSrc: Oral  Weight: 173 lb 12.8 oz (78.8 kg)  Height: 4' 11.8" (1.519 m)  PainSc: 0-No pain   Body mass index is 34.17 kg/m.   Objective:  Physical Exam Vitals and nursing note  reviewed.  Constitutional:      Appearance: Normal appearance. She is obese.  HENT:     Head: Normocephalic and atraumatic.  Cardiovascular:     Rate and Rhythm: Normal rate and regular rhythm.     Heart sounds: Normal heart sounds.  Pulmonary:     Effort: Pulmonary effort is normal.     Breath sounds: Normal breath sounds.  Skin:    General: Skin is warm.  Neurological:     General: No focal deficit present.     Mental Status: She is alert.  Psychiatric:        Mood and Affect: Mood normal.        Behavior: Behavior normal.         Assessment And Plan:     1. Essential hypertension, benign  Uncontrolled. Again, I question compliance with meds. I will refer her to Dr. Oval Linsey, HTN clinic for further evaluation. I will check renal function.   - Ambulatory referral to Cardiology - CMP14+EGFR  2. Syncope, unspecified syncope type  EKG performed, NSR - no arrhythmia noted - negative T waves, poss ant. Ischemia and nonspecific ST depression. She is encouraged to stay well hydrated.  Importance of medication compliance was also discussed with the patient.   - EKG 12-Lead - Ambulatory referral to Cardiology  3. Palpitations  Chronic, intermittent. I will check labs as listed below. Pt agrees to Cardiology referral. She was advised that she may be given a monitor to wear to determine if arrhythmia is present.   - EKG 12-Lead - Ambulatory referral to Cardiology - CBC no Diff - TSH - Magnesium  4. Estrogen deficiency  She agrees to referral for dexa scan. She prefers early am appts. After chart review, I see order was actually placed Jul 2020. I will have referral coordinator f/u with this.   5. Class 1 obesity due to excess calories with serious comorbidity and body mass index (BMI) of 34.0 to 34.9 in adult  She is encouraged to strive for BMI less than 30 to decrease cardiac risk. Importance of regular exercise was discussed with the patient.    6. Drug therapy  -  Vitamin B12    Maximino Greenland, MD    THE PATIENT IS ENCOURAGED TO PRACTICE SOCIAL DISTANCING DUE TO THE COVID-19 PANDEMIC.

## 2019-12-28 NOTE — Telephone Encounter (Signed)
-----   Message from Glendale Chard, MD sent at 12/24/2019  6:18 PM EDT ----- GGT is elevated, this is a liver test. It is indicative of liver damage due to scarring or necrosis. It is important that you cut back on your alcohol intake. Definitely need to cut back to no more than three drinks per week. TB/KW-pls send rx thiamine 100mg  once daily #90/1 refill. This is important for your brain function. Increased alcohol use can also make it difficult to control your blood pressure - another reason to cut back.

## 2019-12-29 ENCOUNTER — Other Ambulatory Visit: Payer: Self-pay | Admitting: Internal Medicine

## 2019-12-29 DIAGNOSIS — E2839 Other primary ovarian failure: Secondary | ICD-10-CM

## 2020-01-26 ENCOUNTER — Ambulatory Visit: Payer: Self-pay | Admitting: Internal Medicine

## 2020-02-07 ENCOUNTER — Other Ambulatory Visit: Payer: Self-pay

## 2020-02-07 ENCOUNTER — Ambulatory Visit
Admission: RE | Admit: 2020-02-07 | Discharge: 2020-02-07 | Disposition: A | Discharge status: Home / Self Care | Payer: Medicare HMO | Source: Ambulatory Visit | Attending: Internal Medicine | Admitting: Internal Medicine

## 2020-02-07 DIAGNOSIS — E2839 Other primary ovarian failure: Secondary | ICD-10-CM

## 2020-02-07 DIAGNOSIS — M85852 Other specified disorders of bone density and structure, left thigh: Secondary | ICD-10-CM | POA: Diagnosis not present

## 2020-02-07 DIAGNOSIS — Z780 Asymptomatic menopausal state: Secondary | ICD-10-CM | POA: Diagnosis not present

## 2020-03-06 ENCOUNTER — Other Ambulatory Visit: Payer: Self-pay | Admitting: Internal Medicine

## 2020-03-07 ENCOUNTER — Ambulatory Visit: Payer: Medicare HMO | Admitting: Cardiovascular Disease

## 2020-03-07 NOTE — Progress Notes (Deleted)
Cardiology Office Note   Date:  03/07/2020   ID:  Debbie, Barry [birth year], MRN [id number]  PCP:  Glendale Chard, MD  Cardiologist:   Skeet Latch, MD   No chief complaint on file.     History of Present Illness: Debbie Barry is a 69 y.o. female with diabetes, hypertension, peripheral vascular disease, and GERD who is being seen today for the evaluation of hypertension and syncope at the request of Glendale Chard, MD.  Debbie Barry saw Dr. Baird Cancer on 11/2018.  At that time her blood pressure was poorly controlled but she had not taken her medicines yet.  However there has been questions about compliance.  She also reported an episode of syncope that occurred after getting out of the shower.  She was referred to cardiology for further evaluation and management.   Secondary Causes of Hypertension  Medications/Herbal: OCP, steroids, stimulants, antidepressants, weight loss medication, immune suppressants, NSAIDs, sympathomimetics, alcohol, caffeine, licorice, ginseng, St. John's wort, chemo  Sleep Apnea: Renal artery stenosis: Hyperaldosteronism: Hyper/hypothyroidism: Pheochromocytoma: palpitations, tachycardia, headache, diaphoresis (plasma metanephrines) Cushing's syndrome: Cushingoid facies, central obesity, proximal muscle weakness, and ecchymoses, adrenal incidentaloma (cortisol) Coarctation of the aorta:      Past Medical History:  Diagnosis Date  . Fatty liver   . GERD (gastroesophageal reflux disease)   . Hypertension   . Osteomyelitis ([place])    right third toe  . Peripheral vascular disease [facility])     Past Surgical History:  Procedure Laterality Date  . AMPUTATION Right 08/08/2017   Procedure: AMPUTATION RIGHT 3RD TOE;  Surgeon: Newt Minion, MD;  Location: [place];  Service: Orthopedics;  Laterality: Right;  . Bunionectomy Right 2006     Right  . Bunionectony Left 2006      August  . COLONOSCOPY N/A 01/21/2014   Procedure: COLONOSCOPY;   Surgeon: Beryle Beams, MD;  Location: [facility];  Service: Endoscopy;  Laterality: N/A;  . TUBAL LIGATION    . WISDOM TOOTH EXTRACTION       Current Outpatient Medications  Medication Sig Dispense Refill  . amLODipine (NORVASC) 5 MG tablet TAKE 1 TABLET(5 MG) BY MOUTH DAILY 90 tablet 1  . aspirin EC 325 MG tablet Take 325 mg by mouth daily.    . carvedilol (COREG) 12.5 MG tablet Take 1 tablet (12.5 mg total) by mouth 2 (two) times daily with a meal. 180 tablet 1  . Cholecalciferol (VITAMIN D3) 5000 units CAPS Take 5,000 Units by mouth daily.    . Magnesium 400 MG CAPS Take 1 capsule by mouth daily at bedtime 90 capsule 1  . Multiple Vitamin (MULTIVITAMIN WITH MINERALS) TABS tablet Take 1 tablet by mouth daily.    . Potassium 99 MG TABS Take 99 mg by mouth daily.    . potassium chloride SA (KLOR-CON) 20 MEQ tablet Take 1 tablet (20 mEq total) by mouth daily. 5 tablet 0  . sertraline (ZOLOFT) 25 MG tablet Take 1 tablet (25 mg total) by mouth daily. 90 tablet 1  . Tetrahydrozoline HCl (VISINE OP) Place 2 drops into both eyes daily as needed (for dry/irritated eyes).     . thiamine 100 MG tablet Take 1 tablet (100 mg total) by mouth daily. 90 tablet 1   No current facility-administered medications for this visit.    Allergies:   Patient has no known allergies.    Social History:  The patient  reports that she has never smoked. She has never used smokeless tobacco.  She reports current alcohol use. She reports that she does not use drugs.   Family History:  The patient's ***family history includes Breast cancer in her mother; Colon cancer in her mother; Heart Problems in her mother; Heart disease in her father; Hypertension in her father and mother; Multiple myeloma in her father and mother.    ROS:  Please see the history of present illness.   Otherwise, review of systems are positive for {NONE DEFAULTED:18576::"none"}.   All other systems are reviewed and negative.    PHYSICAL  EXAM: VS:  There were no vitals taken for this visit. , BMI There is no height or weight on file to calculate BMI. GENERAL:  Well appearing HEENT:  Pupils equal round and reactive, fundi not visualized, oral mucosa unremarkable NECK:  No jugular venous distention, waveform within normal limits, carotid upstroke brisk and symmetric, no bruits, no thyromegaly LYMPHATICS:  No cervical adenopathy LUNGS:  Clear to auscultation bilaterally HEART:  RRR.  PMI not displaced or sustained,S1 and S2 within normal limits, no S3, no S4, no clicks, no rubs, *** murmurs ABD:  Flat, positive bowel sounds normal in frequency in pitch, no bruits, no rebound, no guarding, no midline pulsatile mass, no hepatomegaly, no splenomegaly EXT:  2 plus pulses throughout, no edema, no cyanosis no clubbing SKIN:  No rashes no nodules NEURO:  Cranial nerves II through XII grossly intact, motor grossly intact throughout PSYCH:  Cognitively intact, oriented to person place and time    EKG:  EKG {ACTION; IS/IS [facility]:[id number]} ordered today. The ekg ordered today demonstrates ***   Recent Labs: 12/22/2019: ALT 29; BUN 5; Creatinine, Ser 0.60; Hemoglobin 16.9; Magnesium 1.5; Platelets 153; Potassium 2.9; Sodium 141; TSH 3.050    Lipid Panel    Component Value Date/Time   CHOL 177 03/23/2019 1517   TRIG 169 (H) 03/23/2019 1517   HDL 75 03/23/2019 1517   CHOLHDL 2.4 03/23/2019 1517   LDLCALC 74 03/23/2019 1517      Wt Readings from Last 3 Encounters:  12/22/19 173 lb 12.8 oz (78.8 kg)  03/23/19 183 lb 12.8 oz (83.4 kg)  09/25/17 180 lb (81.6 kg)      ASSESSMENT AND PLAN:  ***   Current medicines are reviewed at length with the patient today.  The patient {ACTIONS; HAS/DOES NOT HAVE:19233} concerns regarding medicines.  The following changes have been made:  {PLAN; NO CHANGE:13088:s}  Labs/ tests ordered today include: *** No orders of the defined types were placed in this encounter.    Disposition:    FU with ***     Signed, Marijean Montanye C. Oval Linsey, MD, [facility]  03/07/2020 8:03 AM    [place]

## 2020-03-14 ENCOUNTER — Other Ambulatory Visit: Payer: Self-pay

## 2020-03-14 ENCOUNTER — Ambulatory Visit (INDEPENDENT_AMBULATORY_CARE_PROVIDER_SITE_OTHER): Payer: Medicare HMO | Admitting: Internal Medicine

## 2020-03-14 ENCOUNTER — Encounter: Payer: Self-pay | Admitting: Internal Medicine

## 2020-03-14 VITALS — BP 130/80 | HR 103 | Temp 97.9°F | Ht 61.0 in | Wt 161.0 lb

## 2020-03-14 DIAGNOSIS — E6609 Other obesity due to excess calories: Secondary | ICD-10-CM

## 2020-03-14 DIAGNOSIS — R945 Abnormal results of liver function studies: Secondary | ICD-10-CM | POA: Diagnosis not present

## 2020-03-14 DIAGNOSIS — J3489 Other specified disorders of nose and nasal sinuses: Secondary | ICD-10-CM

## 2020-03-14 DIAGNOSIS — I10 Essential (primary) hypertension: Secondary | ICD-10-CM

## 2020-03-14 DIAGNOSIS — M89319 Hypertrophy of bone, unspecified shoulder: Secondary | ICD-10-CM

## 2020-03-14 DIAGNOSIS — R748 Abnormal levels of other serum enzymes: Secondary | ICD-10-CM | POA: Diagnosis not present

## 2020-03-14 DIAGNOSIS — Z6830 Body mass index (BMI) 30.0-30.9, adult: Secondary | ICD-10-CM

## 2020-03-14 DIAGNOSIS — R000 Tachycardia, unspecified: Secondary | ICD-10-CM

## 2020-03-14 DIAGNOSIS — D582 Other hemoglobinopathies: Secondary | ICD-10-CM | POA: Diagnosis not present

## 2020-03-14 NOTE — Patient Instructions (Signed)

## 2020-03-14 NOTE — Progress Notes (Signed)
I,Tianna Badgett,acting as a Education administrator for Maximino Greenland, MD.,have documented all relevant documentation on the behalf of Maximino Greenland, MD,as directed by  Maximino Greenland, MD while in the presence of Maximino Greenland, MD.  This visit occurred during the SARS-CoV-2 public health emergency.  Safety protocols were in place, including screening questions prior to the visit, additional usage of staff PPE, and extensive cleaning of exam room while observing appropriate contact time as indicated for disinfecting solutions.  Subjective:     Patient ID: Debbie Barry , female    DOB: 1951-02-16 , 69 y.o.   MRN: [id number]   Chief Complaint  Patient presents with  . Hypertension    HPI  She is here today for BP check. Reports compliance with meds.  However, she admits she has yet to take her medications. She denies chest pain and shortness of breath.    Hypertension This is a chronic problem. The current episode started more than 1 year ago. Pertinent negatives include no blurred vision or chest pain. Risk factors for coronary artery disease include obesity, sedentary lifestyle and post-menopausal state. Past treatments include ACE inhibitors, calcium channel blockers and diuretics. The current treatment provides mild improvement.     Past Medical History:  Diagnosis Date  . Fatty liver   . GERD (gastroesophageal reflux disease)   . Hypertension   . Osteomyelitis ([place])    right third toe  . Peripheral vascular disease ([place])      Family History  Problem Relation Age of Onset  . Breast cancer Mother   . Colon cancer Mother   . Heart Problems Mother   . Hypertension Mother   . Multiple myeloma Mother   . Hypertension Father   . Heart disease Father   . Multiple myeloma Father      Current Outpatient Medications:  .  amLODipine (NORVASC) 5 MG tablet, TAKE 1 TABLET(5 MG) BY MOUTH DAILY, Disp: 90 tablet, Rfl: 1 .  aspirin EC 325 MG tablet, Take 325 mg by mouth daily., Disp: ,  Rfl:  .  carvedilol (COREG) 12.5 MG tablet, Take 1 tablet (12.5 mg total) by mouth 2 (two) times daily with a meal., Disp: 180 tablet, Rfl: 1 .  Cholecalciferol (VITAMIN D3) 5000 units CAPS, Take 5,000 Units by mouth daily., Disp: , Rfl:  .  Multiple Vitamin (MULTIVITAMIN WITH MINERALS) TABS tablet, Take 1 tablet by mouth daily., Disp: , Rfl:  .  potassium chloride SA (KLOR-CON) 20 MEQ tablet, Take 1 tablet (20 mEq total) by mouth daily., Disp: 5 tablet, Rfl: 0 .  sertraline (ZOLOFT) 25 MG tablet, Take 1 tablet (25 mg total) by mouth daily., Disp: 90 tablet, Rfl: 1 .  Tetrahydrozoline HCl (VISINE OP), Place 2 drops into both eyes daily as needed (for dry/irritated eyes). , Disp: , Rfl:  .  Magnesium 400 MG CAPS, Take 1 capsule by mouth daily at bedtime, Disp: 90 capsule, Rfl: 1   No Known Allergies   Review of Systems  Constitutional: Positive for fatigue.  Eyes: Negative for blurred vision.  Cardiovascular: Negative for chest pain.     Today's Vitals   03/14/20 1155  BP: 130/80  Pulse: (!) 103  Temp: 97.9 F (36.6 C)  TempSrc: Oral  SpO2: 96%  Weight: 161 lb (73 kg)  Height: 5' 1" (1.549 m)   Body mass index is 30.42 kg/m.   Objective:  Physical Exam Vitals and nursing note reviewed.  Constitutional:      Appearance:  Normal appearance. She is obese.  HENT:     Head: Normocephalic and atraumatic.  Cardiovascular:     Rate and Rhythm: Regular rhythm. Tachycardia present.     Heart sounds: Normal heart sounds.  Pulmonary:     Effort: Pulmonary effort is normal.     Breath sounds: Normal breath sounds.  Musculoskeletal:     Cervical back: Normal range of motion.     Comments: Right clavicular bony abnormality.   Skin:    General: Skin is warm.  Neurological:     General: No focal deficit present.     Mental Status: She is alert.  Psychiatric:        Mood and Affect: Mood normal.        Behavior: Behavior normal.         Assessment And Plan:     1. Essential  hypertension, benign Comments: Chronic, improved control.  She will continue with current meds. Encouraged to follow a low-sodium diet.   2. Tachycardia Comments: Encouraged to take magnesium nightly and to increase her water intake. If persistent, will consider Cardiology referral.   3. Rhinorrhea Comments: COVID testing performed.  - Novel Coronavirus, NAA ([facility])  4. Elevated liver enzymes Comments: I will check repeat LFTs and GGT today. RUQ u/s from 2017 reviewed - no focal lesions identified. Will consider repeat study in future.  - Liver Profile - Gamma GT, GGT ([id number])  5. Elevated hemoglobin (HCC) Comments: I will check repeat CBC and iron levels.  - CBC with Diff  6. Elevated alkaline phosphatase level Comments: I will check alkaline phosphatase isoenzymes.  - Alkaline Phosphatase, Isoenzymes  7. Class 1 obesity due to excess calories with serious comorbidity and body mass index (BMI) of 30.0 to 30.9 in adult Comments: Encouraged to strive for BMI less than 30 to decrease cardiac risk. Advised to increase daily activity level, aiming for 150 minutes of exercise per week.   8. Clavicle enlargement - DG Clavicle Right; Future    Patient was given opportunity to ask questions. Patient verbalized understanding of the plan and was able to repeat key elements of the plan. All questions were answered to their satisfaction.  Maximino Greenland, MD   I, Maximino Greenland, MD, have reviewed all documentation for this visit. The documentation on 03/26/20 for the exam, diagnosis, procedures, and orders are all accurate and complete.  THE PATIENT IS ENCOURAGED TO PRACTICE SOCIAL DISTANCING DUE TO THE COVID-19 PANDEMIC.

## 2020-03-16 LAB — CBC WITH DIFFERENTIAL/PLATELET
Basophils Absolute: 0.1 10*3/uL (ref 0.0–0.2)
Basos: 1 %
EOS (ABSOLUTE): 0.1 10*3/uL (ref 0.0–0.4)
Eos: 2 %
Hematocrit: 45.9 % (ref 34.0–46.6)
Hemoglobin: 16.2 g/dL — ABNORMAL HIGH (ref 11.1–15.9)
Immature Grans (Abs): 0 10*3/uL (ref 0.0–0.1)
Immature Granulocytes: 0 %
Lymphocytes Absolute: 1.4 10*3/uL (ref 0.7–3.1)
Lymphs: 23 %
MCH: 35.7 pg — ABNORMAL HIGH (ref 26.6–33.0)
MCHC: 35.3 g/dL (ref 31.5–35.7)
MCV: 101 fL — ABNORMAL HIGH (ref 79–97)
Monocytes Absolute: 0.6 10*3/uL (ref 0.1–0.9)
Monocytes: 10 %
Neutrophils Absolute: 3.8 10*3/uL (ref 1.4–7.0)
Neutrophils: 64 %
Platelets: 113 10*3/uL — ABNORMAL LOW (ref 150–450)
RBC: 4.54 x10E6/uL (ref 3.77–5.28)
RDW: 13.1 % (ref 11.7–15.4)
WBC: 5.9 10*3/uL (ref 3.4–10.8)

## 2020-03-16 LAB — HEPATIC FUNCTION PANEL
ALT: 27 IU/L (ref 0–32)
AST: 51 IU/L — ABNORMAL HIGH (ref 0–40)
Albumin: 3.2 g/dL — ABNORMAL LOW (ref 3.8–4.8)
Alkaline Phosphatase: 193 IU/L — ABNORMAL HIGH (ref 44–121)
Bilirubin Total: 0.6 mg/dL (ref 0.0–1.2)
Bilirubin, Direct: 0.29 mg/dL (ref 0.00–0.40)
Total Protein: 6.4 g/dL (ref 6.0–8.5)

## 2020-03-16 LAB — ALKALINE PHOSPHATASE, ISOENZYMES
BONE FRACTION: 25 % (ref 14–68)
INTESTINAL FRAC.: 2 % (ref 0–18)
LIVER FRACTION: 73 % (ref 18–85)

## 2020-03-16 LAB — GAMMA GT: GGT: 126 IU/L — ABNORMAL HIGH (ref 0–60)

## 2020-03-16 LAB — SARS-COV-2, NAA 2 DAY TAT

## 2020-03-16 LAB — NOVEL CORONAVIRUS, NAA: SARS-CoV-2, NAA: NOT DETECTED

## 2020-03-17 ENCOUNTER — Telehealth: Payer: Self-pay

## 2020-03-17 NOTE — Telephone Encounter (Signed)
-----   Message from Glendale Chard, MD sent at 03/16/2020 10:35 PM EDT ----- Your liver enzymes are elevated. Your alkaline phosphatase is elevated.I will order additional testing - this could be due to bone issues. Is she wiling to have clavicle xray? If yes, pls put in order for clavicle series dx: clavicle abnormality. Is any part of her clavicle tender? Does anyone in her family have hemochromatosis? Pls add ferritin, fe/tibc - dx: elevated liver enzymes. COVID test is negative

## 2020-03-17 NOTE — Telephone Encounter (Signed)
Left the patient a message to call back for lab results. 

## 2020-03-20 ENCOUNTER — Telehealth: Payer: Self-pay

## 2020-03-20 ENCOUNTER — Other Ambulatory Visit: Payer: Self-pay

## 2020-03-20 DIAGNOSIS — D492 Neoplasm of unspecified behavior of bone, soft tissue, and skin: Secondary | ICD-10-CM

## 2020-03-20 NOTE — Telephone Encounter (Signed)
-----   Message from Glendale Chard, MD sent at 03/16/2020 10:35 PM EDT ----- Your liver enzymes are elevated. Your alkaline phosphatase is elevated.I will order additional testing - this could be due to bone issues. Is she wiling to have clavicle xray? If yes, pls put in order for clavicle series dx: clavicle abnormality. Is any part of her clavicle tender? Does anyone in her family have hemochromatosis? Pls add ferritin, fe/tibc - dx: elevated liver enzymes. COVID test is negative

## 2020-03-20 NOTE — Telephone Encounter (Signed)
I left a detailed message at the pt's request.

## 2020-03-21 LAB — SPECIMEN STATUS REPORT

## 2020-03-21 LAB — IRON AND TIBC
Iron Saturation: 78 % (ref 15–55)
Iron: 207 ug/dL — ABNORMAL HIGH (ref 27–139)
Total Iron Binding Capacity: 267 ug/dL (ref 250–450)
UIBC: 60 ug/dL — ABNORMAL LOW (ref 118–369)

## 2020-03-27 ENCOUNTER — Telehealth: Payer: Self-pay

## 2020-03-27 NOTE — Telephone Encounter (Signed)
I was calling the pt to let her know that Dr. Baird Cancer said she is sending the pt to a blood specialist and that she should hear about her x-ray appt this week.

## 2020-03-28 ENCOUNTER — Other Ambulatory Visit: Payer: Self-pay

## 2020-03-28 ENCOUNTER — Telehealth: Payer: Self-pay

## 2020-03-28 NOTE — Telephone Encounter (Signed)
Patient notified  I was calling the pt to let her know that Dr. Baird Cancer said she is sending the pt to a blood specialist and that she should hear about her x-ray appt this week.  The pt said ok to seeing a blood specialist.

## 2020-03-30 ENCOUNTER — Encounter: Payer: Medicare HMO | Admitting: Internal Medicine

## 2020-03-30 ENCOUNTER — Ambulatory Visit: Payer: Medicare HMO

## 2020-03-30 ENCOUNTER — Other Ambulatory Visit: Payer: Self-pay

## 2020-03-30 ENCOUNTER — Encounter: Payer: Self-pay | Admitting: Internal Medicine

## 2020-03-30 ENCOUNTER — Ambulatory Visit (INDEPENDENT_AMBULATORY_CARE_PROVIDER_SITE_OTHER): Payer: Medicare HMO

## 2020-03-30 ENCOUNTER — Ambulatory Visit (INDEPENDENT_AMBULATORY_CARE_PROVIDER_SITE_OTHER): Payer: Medicare HMO | Admitting: Internal Medicine

## 2020-03-30 VITALS — BP 162/100 | HR 106 | Temp 98.1°F | Ht 60.6 in | Wt 159.8 lb

## 2020-03-30 DIAGNOSIS — Z23 Encounter for immunization: Secondary | ICD-10-CM

## 2020-03-30 DIAGNOSIS — Z0000 Encounter for general adult medical examination without abnormal findings: Secondary | ICD-10-CM

## 2020-03-30 DIAGNOSIS — M89319 Hypertrophy of bone, unspecified shoulder: Secondary | ICD-10-CM

## 2020-03-30 DIAGNOSIS — I10 Essential (primary) hypertension: Secondary | ICD-10-CM

## 2020-03-30 LAB — POCT UA - MICROALBUMIN
Albumin/Creatinine Ratio, Urine, POC: <30
Creatinine, POC: 50 mg/dL
Microalbumin Ur, POC: 10 mg/L

## 2020-03-30 LAB — POCT URINALYSIS DIPSTICK
Bilirubin, UA: NEGATIVE
Blood, UA: NEGATIVE
Glucose, UA: NEGATIVE
Ketones, UA: 15
Nitrite, UA: NEGATIVE
Protein, UA: NEGATIVE
Spec Grav, UA: 1.015 (ref 1.010–1.025)
Urobilinogen, UA: 0.2 E.U./dL
pH, UA: 7 (ref 5.0–8.0)

## 2020-03-30 MED ORDER — PREVNAR 13 IM SUSP: 0.5000 mL | INTRAMUSCULAR | 0 refills | Status: AC

## 2020-03-30 NOTE — Patient Instructions (Signed)
Debbie Barry , Thank you for taking time to come for your Medicare Wellness Visit. I appreciate your ongoing commitment to your health goals. Please review the following plan we discussed and let me know if I can assist you in the future.   Screening recommendations/referrals: Colonoscopy: completed 01/21/2014 Mammogram: patient to schedule Bone Density: completed 02/07/2020 Recommended yearly ophthalmology/optometry visit for glaucoma screening and checkup Recommended yearly dental visit for hygiene and checkup  Vaccinations: Influenza vaccine: decline Pneumococcal vaccine: sent to pharmacy Tdap vaccine: completed 10/16/2015 Shingles vaccine: discussed   Covid-19: 10/07/2019, 09/16/2019  Advanced directives: Advance directive discussed with you today. Even though you declined this today please call our office should you change your mind and we can give you the proper paperwork for you to fill out.  Conditions/risks identified: none  Next appointment: 06/21/2020 at 11:45 Follow up in one year for your annual wellness visit    Preventive Care 65 Years and Older, Female Preventive care refers to lifestyle choices and visits with your health care provider that can promote health and wellness. What does preventive care include?  A yearly physical exam. This is also called an annual well check.  Dental exams once or twice a year.  Routine eye exams. Ask your health care provider how often you should have your eyes checked.  Personal lifestyle choices, including:  Daily care of your teeth and gums.  Regular physical activity.  Eating a healthy diet.  Avoiding tobacco and drug use.  Limiting alcohol use.  Practicing safe sex.  Taking low-dose aspirin every day.  Taking vitamin and mineral supplements as recommended by your health care provider. What happens during an annual well check? The services and screenings done by your health care provider during your annual well check  will depend on your age, overall health, lifestyle risk factors, and family history of disease. Counseling  Your health care provider may ask you questions about your:  Alcohol use.  Tobacco use.  Drug use.  Emotional well-being.  Home and relationship well-being.  Sexual activity.  Eating habits.  History of falls.  Memory and ability to understand (cognition).  Work and work Statistician.  Reproductive health. Screening  You may have the following tests or measurements:  Height, weight, and BMI.  Blood pressure.  Lipid and cholesterol levels. These may be checked every 5 years, or more frequently if you are over 63 years old.  Skin check.  Lung cancer screening. You may have this screening every year starting at age 41 if you have a 30-pack-year history of smoking and currently smoke or have quit within the past 15 years.  Fecal occult blood test (FOBT) of the stool. You may have this test every year starting at age 26.  Flexible sigmoidoscopy or colonoscopy. You may have a sigmoidoscopy every 5 years or a colonoscopy every 10 years starting at age 19.  Hepatitis C blood test.  Hepatitis B blood test.  Sexually transmitted disease (STD) testing.  Diabetes screening. This is done by checking your blood sugar (glucose) after you have not eaten for a while (fasting). You may have this done every 1-3 years.  Bone density scan. This is done to screen for osteoporosis. You may have this done starting at age 26.  Mammogram. This may be done every 1-2 years. Talk to your health care provider about how often you should have regular mammograms. Talk with your health care provider about your test results, treatment options, and if necessary, the need for more  tests. Vaccines  Your health care provider may recommend certain vaccines, such as:  Influenza vaccine. This is recommended every year.  Tetanus, diphtheria, and acellular pertussis (Tdap, Td) vaccine. You may  need a Td booster every 10 years.  Zoster vaccine. You may need this after age 48.  Pneumococcal 13-valent conjugate (PCV13) vaccine. One dose is recommended after age 42.  Pneumococcal polysaccharide (PPSV23) vaccine. One dose is recommended after age 24. Talk to your health care provider about which screenings and vaccines you need and how often you need them. This information is not intended to replace advice given to you by your health care provider. Make sure you discuss any questions you have with your health care provider. Document Released: 07/14/2015 Document Revised: 03/06/2016 Document Reviewed: 04/18/2015 Elsevier Interactive Patient Education  2017 [employer] Prevention in the Home Falls can cause injuries. They can happen to people of all ages. There are many things you can do to make your home safe and to help prevent falls. What can I do on the outside of my home?  Regularly fix the edges of walkways and driveways and fix any cracks.  Remove anything that might make you trip as you walk through a door, such as a raised step or threshold.  Trim any bushes or trees on the path to your home.  Use bright outdoor lighting.  Clear any walking paths of anything that might make someone trip, such as rocks or tools.  Regularly check to see if handrails are loose or broken. Make sure that both sides of any steps have handrails.  Any raised decks and porches should have guardrails on the edges.  Have any leaves, snow, or ice cleared regularly.  Use sand or salt on walking paths during winter.  Clean up any spills in your garage right away. This includes oil or grease spills. What can I do in the bathroom?  Use night lights.  Install grab bars by the toilet and in the tub and shower. Do not use towel bars as grab bars.  Use non-skid mats or decals in the tub or shower.  If you need to sit down in the shower, use a plastic, non-slip stool.  Keep the floor  dry. Clean up any water that spills on the floor as soon as it happens.  Remove soap buildup in the tub or shower regularly.  Attach bath mats securely with double-sided non-slip rug tape.  Do not have throw rugs and other things on the floor that can make you trip. What can I do in the bedroom?  Use night lights.  Make sure that you have a light by your bed that is easy to reach.  Do not use any sheets or blankets that are too big for your bed. They should not hang down onto the floor.  Have a firm chair that has side arms. You can use this for support while you get dressed.  Do not have throw rugs and other things on the floor that can make you trip. What can I do in the kitchen?  Clean up any spills right away.  Avoid walking on wet floors.  Keep items that you use a lot in easy-to-reach places.  If you need to reach something above you, use a strong step stool that has a grab bar.  Keep electrical cords out of the way.  Do not use floor polish or wax that makes floors slippery. If you must use wax, use non-skid floor  wax.  Do not have throw rugs and other things on the floor that can make you trip. What can I do with my stairs?  Do not leave any items on the stairs.  Make sure that there are handrails on both sides of the stairs and use them. Fix handrails that are broken or loose. Make sure that handrails are as long as the stairways.  Check any carpeting to make sure that it is firmly attached to the stairs. Fix any carpet that is loose or worn.  Avoid having throw rugs at the top or bottom of the stairs. If you do have throw rugs, attach them to the floor with carpet tape.  Make sure that you have a light switch at the top of the stairs and the bottom of the stairs. If you do not have them, ask someone to add them for you. What else can I do to help prevent falls?  Wear shoes that:  Do not have high heels.  Have rubber bottoms.  Are comfortable and fit you  well.  Are closed at the toe. Do not wear sandals.  If you use a stepladder:  Make sure that it is fully opened. Do not climb a closed stepladder.  Make sure that both sides of the stepladder are locked into place.  Ask someone to hold it for you, if possible.  Clearly mark and make sure that you can see:  Any grab bars or handrails.  First and last steps.  Where the edge of each step is.  Use tools that help you move around (mobility aids) if they are needed. These include:  Canes.  Walkers.  Scooters.  Crutches.  Turn on the lights when you go into a dark area. Replace any light bulbs as soon as they burn out.  Set up your furniture so you have a clear path. Avoid moving your furniture around.  If any of your floors are uneven, fix them.  If there are any pets around you, be aware of where they are.  Review your medicines with your doctor. Some medicines can make you feel dizzy. This can increase your chance of falling. Ask your doctor what other things that you can do to help prevent falls. This information is not intended to replace advice given to you by your health care provider. Make sure you discuss any questions you have with your health care provider. Document Released: 04/13/2009 Document Revised: 11/23/2015 Document Reviewed: 07/22/2014 Elsevier Interactive Patient Education  2017 [employer].

## 2020-03-30 NOTE — Addendum Note (Signed)
Addended by: Glenna Durand E on: 03/30/2020 12:17 PM   Modules accepted: Orders

## 2020-03-30 NOTE — Progress Notes (Signed)
This visit occurred during the SARS-CoV-2 public health emergency.  Safety protocols were in place, including screening questions prior to the visit, additional usage of staff PPE, and extensive cleaning of exam room while observing appropriate contact time as indicated for disinfecting solutions.  Subjective:   Zanaria Morell is a 69 y.o. female who presents for an Initial Medicare Annual Wellness Visit.  Review of Systems     Cardiac Risk Factors include: advanced age (>11mn, >>30women);hypertension;obesity (BMI >30kg/m2);sedentary lifestyle     Objective:    Today's Vitals   03/30/20 1004 03/30/20 1009  BP: (!) 162/100   Pulse: (!) 106   Temp: 98.1 F (36.7 C)   TempSrc: Oral   SpO2: 96%   Weight: 159 lb 12.8 oz (72.5 kg)   Height: 5' 0.6" (1.539 m)   PainSc:  3    Body mass index is 30.59 kg/m.  Advanced Directives 03/30/2020 03/23/2019 07/29/2017 11/18/2016 01/21/2014 08/30/2013  Does Patient Have a Medical Advance Directive? No No No No Patient does not have advance directive;Patient would not like information Patient does not have advance directive  Would patient like information on creating a medical advance directive? No - Patient declined - No - Patient declined - - -    Current Medications (verified) Outpatient Encounter Medications as of 03/30/2020  Medication Sig  . amLODipine (NORVASC) 5 MG tablet TAKE 1 TABLET(5 MG) BY MOUTH DAILY  . aspirin EC 325 MG tablet Take 325 mg by mouth daily.  . carvedilol (COREG) 12.5 MG tablet Take 1 tablet (12.5 mg total) by mouth 2 (two) times daily with a meal.  . Cholecalciferol (VITAMIN D3) 5000 units CAPS Take 5,000 Units by mouth daily.  . Magnesium 400 MG CAPS Take 1 capsule by mouth daily at bedtime  . Multiple Vitamin (MULTIVITAMIN WITH MINERALS) TABS tablet Take 1 tablet by mouth daily.  . potassium chloride SA (KLOR-CON) 20 MEQ tablet Take 1 tablet (20 mEq total) by mouth daily.  . sertraline (ZOLOFT) 25 MG tablet Take 1  tablet (25 mg total) by mouth daily.  . Tetrahydrozoline HCl (VISINE OP) Place 2 drops into both eyes daily as needed (for dry/irritated eyes).   . pneumococcal 13-valent conjugate vaccine (PREVNAR 13) SUSP injection Inject 0.5 mLs into the muscle tomorrow at 10 am for 1 dose.   No facility-administered encounter medications on file as of 03/30/2020.    Allergies (verified) Patient has no known allergies.   History: Past Medical History:  Diagnosis Date  . Fatty liver   . GERD (gastroesophageal reflux disease)   . Hypertension   . [place]    right third [place]  . Peripheral vascular disease ([facility]    Past Surgical History:  Procedure Laterality Date  . AMPUTATION Right 08/08/2017   Procedure: AMPUTATION RIGHT 3RD TOE;  Surgeon: DNewt Minion MD;  [facility]  Service: Orthopedics;  Laterality: Right;  . Bunionectomy Right 2006     Right  . Bunionectony Left 2006      August  . COLONOSCOPY N/A 01/21/2014   Procedure: COLONOSCOPY;  Surgeon: PBeryle Beams MD;  Location: [facility];  Service: Endoscopy;  Laterality: N/A;  . TUBAL LIGATION    . WISDOM TOOTH EXTRACTION     Family History  Problem Relation Age of Onset  . Breast cancer Mother   . Colon cancer Mother   . Heart Problems Mother   . Hypertension Mother   . Multiple myeloma Mother   . Hypertension Father   .  Heart disease Father   . Multiple myeloma Father    Social History   Socioeconomic History  . Marital status: Married    Spouse name: Not on file  . Number of children: Not on file  . Years of education: Not on file  . Highest education level: Not on file  Occupational History  . Not on file  Tobacco Use  . Smoking status: Never Smoker  . Smokeless tobacco: Never Used  Vaping Use  . Vaping Use: Never used  Substance and Sexual Activity  . Alcohol use: Not Currently    Comment: social glass of wine  . Drug use: No  . Sexual activity: Not on file  Other Topics Concern  . Not on  file  Social History Narrative  . Not on file   Social Determinants of Health   Financial Resource Strain: Low Risk   . Difficulty of Paying Living Expenses: Not hard at all  Food Insecurity: No Food Insecurity  . Worried About Charity fundraiser in the Last Year: Never true  . Ran Out of Food in the Last Year: Never true  Transportation Needs: No Transportation Needs  . Lack of Transportation (Medical): No  . Lack of Transportation (Non-Medical): No  Physical Activity: Inactive  . Days of Exercise per Week: 0 days  . Minutes of Exercise per Session: 0 min  Stress: No Stress Concern Present  . Feeling of Stress : Not at all  Social Connections:   . Frequency of Communication with Friends and Family: Not on file  . Frequency of Social Gatherings with Friends and Family: Not on file  . Attends Religious Services: Not on file  . Active Member of Clubs or Organizations: Not on file  . Attends Archivist Meetings: Not on file  . Marital Status: Not on file    Tobacco Counseling Counseling given: Not Answered   Clinical Intake:  Pre-visit preparation completed: Yes  Pain : 0-10 Pain Score: 3  Pain Type: Acute pain Pain Location: Back Pain Orientation: Right, Lower Pain Descriptors / Indicators: Aching, Constant Pain Onset: 1 to 4 weeks ago Pain Relieving Factors: aleve helped  Pain Relieving Factors: aleve helped  Nutritional Status: BMI > 30  Obese Nutritional Risks: None Diabetes: No  How often do you need to have someone help you when you read instructions, pamphlets, or other written materials from your doctor or pharmacy?: 1 - Never  Diabetic? no  Interpreter Needed?: No  Information entered by :: 12th grade   Activities of Daily Living In your present state of health, do you have any difficulty performing the following activities: 03/30/2020  Hearing? N  Vision? N  Difficulty concentrating or making decisions? N  Walking or climbing stairs? Y   Dressing or bathing? N  Doing errands, shopping? N  Preparing Food and eating ? N  Using the Toilet? N  In the past six months, have you accidently leaked urine? N  Do you have problems with loss of bowel control? N  Managing your Medications? N  Managing your Finances? N  Housekeeping or managing your Housekeeping? N  Some recent data might be hidden    Patient Care Team: Glendale Chard, MD as PCP - General (Internal Medicine)  Indicate any recent Medical Services you may have received from other than [facility] providers in the past year (date may be approximate).     Assessment:   This is a routine wellness examination for [place].  Hearing/Vision screen  Hearing Screening   125Hz 250Hz 500Hz 1000Hz 2000Hz 3000Hz 4000Hz 6000Hz 8000Hz  Right ear:           Left ear:           Vision Screening Comments: Regular eye exams, [facility]  Dietary issues and exercise activities discussed: Current Exercise Habits: The patient does not participate in regular exercise at present  Goals    . Patient Stated     03/30/2020, no goals    . Weight (lb) < 200 lb (90.7 kg)     "I would like to lose weight"      Depression Screen PHQ 2/9 Scores 03/30/2020 03/23/2019 11/18/2018  PHQ - 2 Score 0 0 0    Fall Risk Fall Risk  03/30/2020 03/23/2019 11/18/2018  Falls in the past year? 1 0 0  Comment lost balance, bent over to pick something up, going to the bathroom, feel in the bath tub water was too hot, - -  Number falls in past yr: 1 - -  Injury with Fall? 0 - -  Risk for fall due to : Impaired balance/gait;Medication side effect - -  Follow up Falls evaluation completed;Education provided;Falls prevention discussed - -    Any stairs in or around the home? No  If so, are there any without handrails? n/a Home free of loose throw rugs in walkways, pet beds, electrical cords, etc? Yes  Adequate lighting in your home to reduce risk of falls? Yes   ASSISTIVE DEVICES UTILIZED TO PREVENT  FALLS:  Life alert? No  Use of a cane, walker or w/c? No  Grab bars in the bathroom? Yes  Shower chair or bench in shower? No  Elevated toilet seat or a handicapped toilet? Yes   TIMED UP AND GO:  Was the test performed? No . .   Gait steady and fast without use of assistive device  Cognitive Function:     6CIT Screen 03/30/2020 03/23/2019  What Year? 0 points 0 points  What month? 0 points 0 points  What time? 0 points 0 points  Count back from 20 0 points 0 points  Months in reverse 0 points 0 points  Repeat phrase 2 points 0 points  Total Score 2 0    Immunizations Immunization History  Administered Date(s) Administered  . Influenza Split 07/11/2014  . Influenza, High Dose Seasonal PF 03/23/2019  . Influenza, Quadrivalent, Recombinant, Inj, Pf 03/30/2018  . Influenza,inj,Quad PF,6+ Mos 03/23/2017  . PFIZER SARS-COV-2 Vaccination 09/16/2019, 10/07/2019  . Pneumococcal Polysaccharide-23 03/23/2019  . Zoster Recombinat (Shingrix) 05/28/2019, 08/03/2019    TDAP status: Up to date Flu Vaccine status: Declined, Education has been provided regarding the importance of this vaccine but patient still declined. Advised may receive this vaccine at local pharmacy or Health Dept. Aware to provide a copy of the vaccination record if obtained from local pharmacy or Health Dept. Verbalized acceptance and understanding. Pneumococcal vaccine status: sent to pharmacy Covid-19 vaccine status: Completed vaccines  Qualifies for Shingles Vaccine? Yes   Zostavax completed No   Shingrix Completed?: Yes  Screening Tests Health Maintenance  Topic Date Due  . INFLUENZA VACCINE  01/30/2020  . PNA vac Low Risk Adult (2 of 2 - PCV13) 03/22/2020  . MAMMOGRAM  07/03/2020  . COLONOSCOPY  01/22/2024  . TETANUS/TDAP  10/15/2025  . DEXA SCAN  Completed  . COVID-19 Vaccine  Completed  . Hepatitis C Screening  Completed    Health Maintenance  Health Maintenance Due  Topic Date  Due  .  INFLUENZA VACCINE  01/30/2020  . PNA vac Low Risk Adult (2 of 2 - PCV13) 03/22/2020    Colorectal cancer screening: Completed 01/21/2014. Repeat every 10 years Mammogram status: patient to schedule Bone Density status: Completed 02/07/2020.   Lung Cancer Screening: (Low Dose CT Chest recommended if Age 29-80 years, 30 pack-year currently smoking OR have quit w/in 15years.) does not qualify.   Lung Cancer Screening Referral: no  Additional Screening:  Hepatitis C Screening: does qualify; Completed 12/30/2012  Vision Screening: Recommended annual ophthalmology exams for early detection of glaucoma and other disorders of the eye. Is the patient up to date with their annual eye exam?  Yes  Who is the provider or what is the name of the office in which the patient attends annual eye exams? [facility] If pt is not established with a provider, would they like to be referred to a provider to establish care? No .   Dental Screening: Recommended annual dental exams for proper oral hygiene  Community Resource Referral / Chronic Care Management: CRR required this visit?  No   CCM required this visit?  No      Plan:     I have personally reviewed and noted the following in the patient's chart:   . Medical and social history . Use of alcohol, tobacco or illicit drugs  . Current medications and supplements . Functional ability and status . Nutritional status . Physical activity . Advanced directives . List of other physicians . Hospitalizations, surgeries, and ER visits in previous 12 months . Vitals . Screenings to include cognitive, depression, and falls . Referrals and appointments  In addition, I have reviewed and discussed with patient certain preventive protocols, quality metrics, and best practice recommendations. A written personalized care plan for preventive services as well as general preventive health recommendations were provided to patient.     Kellie Simmering,  LPN   [id number]   Nurse Notes:

## 2020-03-30 NOTE — Patient Instructions (Signed)
Health Maintenance, Female Adopting a healthy lifestyle and getting preventive care are important in promoting health and wellness. Ask your health care provider about:  The right schedule for you to have regular tests and exams.  Things you can do on your own to prevent diseases and keep yourself healthy. What should I know about diet, weight, and exercise? Eat a healthy diet   Eat a diet that includes plenty of vegetables, fruits, low-fat dairy products, and lean protein.  Do not eat a lot of foods that are high in solid fats, added sugars, or sodium. Maintain a healthy weight Body mass index (BMI) is used to identify weight problems. It estimates body fat based on height and weight. Your health care provider can help determine your BMI and help you achieve or maintain a healthy weight. Get regular exercise Get regular exercise. This is one of the most important things you can do for your health. Most adults should:  Exercise for at least 150 minutes each week. The exercise should increase your heart rate and make you sweat (moderate-intensity exercise).  Do strengthening exercises at least twice a week. This is in addition to the moderate-intensity exercise.  Spend less time sitting. Even light physical activity can be beneficial. Watch cholesterol and blood lipids Have your blood tested for lipids and cholesterol at 67 years of age, then have this test every 5 years. Have your cholesterol levels checked more often if:  Your lipid or cholesterol levels are high.  You are older than 67 years of age.  You are at high risk for heart disease. What should I know about cancer screening? Depending on your health history and family history, you may need to have cancer screening at various ages. This may include screening for:  Breast cancer.  Cervical cancer.  Colorectal cancer.  Skin cancer.  Lung cancer. What should I know about heart disease, diabetes, and high blood  pressure? Blood pressure and heart disease  High blood pressure causes heart disease and increases the risk of stroke. This is more likely to develop in people who have high blood pressure readings, are of African descent, or are overweight.  Have your blood pressure checked: ? Every 3-5 years if you are 18-39 years of age. ? Every year if you are 40 years old or older. Diabetes Have regular diabetes screenings. This checks your fasting blood sugar level. Have the screening done:  Once every three years after age 40 if you are at a normal weight and have a low risk for diabetes.  More often and at a younger age if you are overweight or have a high risk for diabetes. What should I know about preventing infection? Hepatitis B If you have a higher risk for hepatitis B, you should be screened for this virus. Talk with your health care provider to find out if you are at risk for hepatitis B infection. Hepatitis C Testing is recommended for:  Everyone born from 1945 through 1965.  Anyone with known risk factors for hepatitis C. Sexually transmitted infections (STIs)  Get screened for STIs, including gonorrhea and chlamydia, if: ? You are sexually active and are younger than 67 years of age. ? You are older than 67 years of age and your health care provider tells you that you are at risk for this type of infection. ? Your sexual activity has changed since you were last screened, and you are at increased risk for chlamydia or gonorrhea. Ask your health care provider if   you are at risk.  Ask your health care provider about whether you are at high risk for HIV. Your health care provider may recommend a prescription medicine to help prevent HIV infection. If you choose to take medicine to prevent HIV, you should first get tested for HIV. You should then be tested every 3 months for as long as you are taking the medicine. Pregnancy  If you are about to stop having your period (premenopausal) and  you may become pregnant, seek counseling before you get pregnant.  Take 400 to 800 micrograms (mcg) of folic acid every day if you become pregnant.  Ask for birth control (contraception) if you want to prevent pregnancy. Osteoporosis and menopause Osteoporosis is a disease in which the bones lose minerals and strength with aging. This can result in bone fractures. If you are 65 years old or older, or if you are at risk for osteoporosis and fractures, ask your health care provider if you should:  Be screened for bone loss.  Take a calcium or vitamin D supplement to lower your risk of fractures.  Be given hormone replacement therapy (HRT) to treat symptoms of menopause. Follow these instructions at home: Lifestyle  Do not use any products that contain nicotine or tobacco, such as cigarettes, e-cigarettes, and chewing tobacco. If you need help quitting, ask your health care provider.  Do not use street drugs.  Do not share needles.  Ask your health care provider for help if you need support or information about quitting drugs. Alcohol use  Do not drink alcohol if: ? Your health care provider tells you not to drink. ? You are pregnant, may be pregnant, or are planning to become pregnant.  If you drink alcohol: ? Limit how much you use to 0-1 drink a day. ? Limit intake if you are breastfeeding.  Be aware of how much alcohol is in your drink. In the [place] one drink equals one 12 oz bottle of beer (355 mL), one 5 oz glass of wine (148 mL), or one 1 oz glass of hard liquor (44 mL). General instructions  Schedule regular health, dental, and eye exams.  Stay current with your vaccines.  Tell your health care provider if: ? You often feel depressed. ? You have ever been abused or do not feel safe at home. Summary  Adopting a healthy lifestyle and getting preventive care are important in promoting health and wellness.  Follow your health care provider's instructions about healthy  diet, exercising, and getting tested or screened for diseases.  Follow your health care provider's instructions on monitoring your cholesterol and blood pressure. This information is not intended to replace advice given to you by your health care provider. Make sure you discuss any questions you have with your health care provider. Document Revised: 06/10/2018 Document Reviewed: 06/10/2018 Elsevier Patient Education  2020 [employer]vier Inc.  

## 2020-03-30 NOTE — Progress Notes (Signed)
I,Katawbba Wiggins,acting as a Education administrator for Maximino Greenland, MD.,have documented all relevant documentation on the behalf of Maximino Greenland, MD,as directed by  Maximino Greenland, MD while in the presence of Maximino Greenland, MD.  This visit occurred during the SARS-CoV-2 public health emergency.  Safety protocols were in place, including screening questions prior to the visit, additional usage of staff PPE, and extensive cleaning of exam room while observing appropriate contact time as indicated for disinfecting solutions.  Subjective:     Patient ID: Debbie Barry , female    DOB: February 22, 1951 , 69 y.o.   MRN: [id number]   Chief Complaint  Patient presents with  . Annual Exam  . Hypertension    HPI  She is here today for a full physical exam. She is no longer followed by GYN. She does not wish to have a pelvic exam performed today. She reports she does not have time, has to get to work.   Hypertension This is a chronic problem. The current episode started more than 1 year ago. Pertinent negatives include no blurred vision, chest pain, palpitations or shortness of breath. Risk factors for coronary artery disease include obesity, sedentary lifestyle and post-menopausal state. Past treatments include ACE inhibitors, calcium channel blockers and diuretics. The current treatment provides mild improvement.     Past Medical History:  Diagnosis Date  . Fatty liver   . GERD (gastroesophageal reflux disease)   . Hypertension   . Osteomyelitis ([place])    right third toe  . Peripheral vascular disease ([place])      Family History  Problem Relation Age of Onset  . Breast cancer Mother   . Colon cancer Mother   . Heart Problems Mother   . Hypertension Mother   . Multiple myeloma Mother   . Hypertension Father   . Heart disease Father   . Multiple myeloma Father      Current Outpatient Medications:  .  amLODipine (NORVASC) 5 MG tablet, TAKE 1 TABLET(5 MG) BY MOUTH DAILY, Disp: 90 tablet,  Rfl: 1 .  aspirin EC 325 MG tablet, Take 325 mg by mouth daily., Disp: , Rfl:  .  carvedilol (COREG) 12.5 MG tablet, Take 1 tablet (12.5 mg total) by mouth 2 (two) times daily with a meal., Disp: 180 tablet, Rfl: 1 .  Cholecalciferol (VITAMIN D3) 5000 units CAPS, Take 5,000 Units by mouth daily., Disp: , Rfl:  .  Magnesium 400 MG CAPS, Take 1 capsule by mouth daily at bedtime, Disp: 90 capsule, Rfl: 1 .  Multiple Vitamin (MULTIVITAMIN WITH MINERALS) TABS tablet, Take 1 tablet by mouth daily., Disp: , Rfl:  .  potassium chloride SA (KLOR-CON) 20 MEQ tablet, Take 1 tablet (20 mEq total) by mouth daily., Disp: 5 tablet, Rfl: 0 .  sertraline (ZOLOFT) 25 MG tablet, Take 1 tablet (25 mg total) by mouth daily., Disp: 90 tablet, Rfl: 1 .  Tetrahydrozoline HCl (VISINE OP), Place 2 drops into both eyes daily as needed (for dry/irritated eyes). , Disp: , Rfl:    No Known Allergies    The patient states she uses post menopausal status for birth control. Last LMP was No LMP recorded. Patient is postmenopausal.. Negative for Dysmenorrhea. Negative for: breast discharge, breast lump(s), breast pain and breast self exam. Associated symptoms include abnormal vaginal bleeding. Pertinent negatives include abnormal bleeding (hematology), anxiety, decreased libido, depression, difficulty falling sleep, dyspareunia, history of infertility, nocturia, sexual dysfunction, sleep disturbances, urinary incontinence, urinary urgency, vaginal discharge and vaginal  itching. Diet regular.The patient states her exercise level is  minimal.   . The patient's tobacco use is:  Social History   Tobacco Use  Smoking Status Never Smoker  Smokeless Tobacco Never Used  . She has been exposed to passive smoke. The patient's alcohol use is:  Social History   Substance and Sexual Activity  Alcohol Use Not Currently   Comment: social glass of wine    Review of Systems  Constitutional: Negative.   HENT: Negative.   Eyes: Negative.   Negative for blurred vision.  Respiratory: Negative.  Negative for shortness of breath.   Cardiovascular: Negative.  Negative for chest pain and palpitations.  Gastrointestinal: Negative.   Endocrine: Negative.   Genitourinary: Negative.   Musculoskeletal: Negative.   Skin: Negative.   Allergic/Immunologic: Negative.   Neurological: Negative.   Hematological: Negative.   Psychiatric/Behavioral: Negative.      Today's Vitals   03/30/20 1032  BP: (!) 162/100  Pulse: (!) 106  Temp: 98.1 F (36.7 C)  TempSrc: Oral  Weight: 159 lb 12.8 oz (72.5 kg)  Height: 5' 0.6" (1.539 m)   Body mass index is 30.59 kg/m.   Objective:  Physical Exam Vitals and nursing note reviewed.  Constitutional:      General: She is not in acute distress.    Appearance: Normal appearance. She is well-developed. She is obese.  HENT:     Head: Normocephalic and atraumatic.     Right Ear: Hearing, tympanic membrane, ear canal and external ear normal. There is no impacted cerumen.     Left Ear: Hearing, tympanic membrane, ear canal and external ear normal. There is no impacted cerumen.     Nose:     Comments: Deferred, masked    Mouth/Throat:     Comments: Deferred, masked Eyes:     General: Lids are normal.     Extraocular Movements: Extraocular movements intact.     Conjunctiva/sclera: Conjunctivae normal.     Pupils: Pupils are equal, round, and reactive to light.     Funduscopic exam:    Right eye: No papilledema.        Left eye: No papilledema.  Neck:     Thyroid: No thyroid mass.     Vascular: No carotid bruit.  Cardiovascular:     Rate and Rhythm: Normal rate and regular rhythm.     Pulses: Normal pulses.     Heart sounds: Normal heart sounds. No murmur heard.   Pulmonary:     Effort: Pulmonary effort is normal.     Breath sounds: Normal breath sounds.  Chest:     Breasts: Tanner Score is 5.        Right: Normal.        Left: Normal.  Abdominal:     General: Bowel sounds are  normal. There is no distension.     Palpations: Abdomen is soft.     Tenderness: There is no abdominal tenderness.     Comments: Obese, soft  Genitourinary:    Comments: Deferred Musculoskeletal:        General: No swelling. Normal range of motion.     Cervical back: Full passive range of motion without pain, normal range of motion and neck supple.     Right lower leg: No edema.     Left lower leg: No edema.     Comments: Bony abnormality of right clavicle.   Feet:     Right foot:     Skin integrity: Callus and  dry skin present.     Left foot:     Skin integrity: Callus and dry skin present.     Comments: R third toe amputation Skin:    General: Skin is warm and dry.     Capillary Refill: Capillary refill takes less than 2 seconds.  Neurological:     General: No focal deficit present.     Mental Status: She is alert and oriented to person, place, and time.     Cranial Nerves: No cranial nerve deficit.     Sensory: No sensory deficit.  Psychiatric:        Mood and Affect: Mood normal.        Behavior: Behavior normal.        Thought Content: Thought content normal.        Judgment: Judgment normal.         Assessment And Plan:     1. Routine general medical examination at health care facility Comments: A full exam was performed. Importance of monthly self breast exams was discussed with the patient. She is aware that pelvic exam must be done next year.  PATIENT IS ADVISED TO GET 30-45 MINUTES REGULAR EXERCISE NO LESS THAN FOUR TO FIVE DAYS PER WEEK - BOTH WEIGHTBEARING EXERCISES AND AEROBIC ARE RECOMMENDED.  PATIENT IS ADVISED TO FOLLOW A HEALTHY DIET WITH AT LEAST SIX FRUITS/VEGGIES PER DAY, DECREASE INTAKE OF RED MEAT, AND TO INCREASE FISH INTAKE TO TWO DAYS PER WEEK.  MEATS/FISH SHOULD NOT BE FRIED, BAKED OR BROILED IS PREFERABLE.  I SUGGEST WEARING SPF 50 SUNSCREEN ON EXPOSED PARTS AND ESPECIALLY WHEN IN THE DIRECT SUNLIGHT FOR AN EXTENDED PERIOD OF TIME.  PLEASE AVOID FAST  FOOD RESTAURANTS AND INCREASE YOUR WATER INTAKE.  - Lipid panel  2. Essential hypertension, benign Comments: Chronic, uncontrolled. Admits she has yet to take her meds today. She was rushing to get here on time. Pt encouraged to take meds daily. Importance of compliance was discussed with the patient. She will rto in 3 months for re-evaluation.   3. Clavicle enlargement Comments: X-ray pending. She is encouraged to get at her earliest convenience.      Patient was given opportunity to ask questions. Patient verbalized understanding of the plan and was able to repeat key elements of the plan. All questions were answered to their satisfaction.   Maximino Greenland, MD   I, Maximino Greenland, MD, have reviewed all documentation for this visit. The documentation on 04/02/20 for the exam, diagnosis, procedures, and orders are all accurate and complete.  THE PATIENT IS ENCOURAGED TO PRACTICE SOCIAL DISTANCING DUE TO THE COVID-19 PANDEMIC.

## 2020-04-04 ENCOUNTER — Telehealth: Payer: Self-pay

## 2020-04-04 NOTE — Telephone Encounter (Signed)
I called the pt to see if she could come back to do her labs that she didn't stop by the lab for the day of her visit.

## 2020-04-11 ENCOUNTER — Ambulatory Visit: Payer: Self-pay

## 2020-04-18 ENCOUNTER — Ambulatory Visit: Payer: Self-pay

## 2020-04-20 ENCOUNTER — Other Ambulatory Visit: Payer: Self-pay | Admitting: Internal Medicine

## 2020-04-20 DIAGNOSIS — Z1231 Encounter for screening mammogram for malignant neoplasm of breast: Secondary | ICD-10-CM

## 2020-04-25 ENCOUNTER — Ambulatory Visit: Payer: Medicare HMO

## 2020-05-01 ENCOUNTER — Ambulatory Visit: Payer: Medicare HMO | Admitting: Cardiovascular Disease

## 2020-05-01 NOTE — Progress Notes (Deleted)
Cardiology Office Note   Date:  05/01/2020   ID:  Debbie Barry, Debbie Barry [birth year], MRN [id number]  PCP:  Glendale Chard, MD  Cardiologist:   Skeet Latch, MD   No chief complaint on file.     History of Present Illness: Debbie Barry is a 69 y.o. female with hypertension, peripheral vascular disease, and GERD who is being seen today for the evaluation of orthostatic hypertension, syncope, and palpitations at the request of Glendale Chard, MD.  Clavicle enlargement  Past Medical History:  Diagnosis Date  . Fatty liver   . GERD (gastroesophageal reflux disease)   . Hypertension   . Osteomyelitis ([place])    right third toe  . Peripheral vascular disease [facility])     Past Surgical History:  Procedure Laterality Date  . AMPUTATION Right 08/08/2017   Procedure: AMPUTATION RIGHT 3RD TOE;  Surgeon: Newt Minion, MD;  Location: [facility];  Service: Orthopedics;  Laterality: Right;  . Bunionectomy Right 2006     Right  . Bunionectony Left 2006      August  . COLONOSCOPY N/A 01/21/2014   Procedure: COLONOSCOPY;  Surgeon: Beryle Beams, MD;  Location: [facility];  Service: Endoscopy;  Laterality: N/A;  . TUBAL LIGATION    . WISDOM TOOTH EXTRACTION       Current Outpatient Medications  Medication Sig Dispense Refill  . amLODipine (NORVASC) 5 MG tablet TAKE 1 TABLET(5 MG) BY MOUTH DAILY 90 tablet 1  . aspirin EC 325 MG tablet Take 325 mg by mouth daily.    . carvedilol (COREG) 12.5 MG tablet Take 1 tablet (12.5 mg total) by mouth 2 (two) times daily with a meal. 180 tablet 1  . Cholecalciferol (VITAMIN D3) 5000 units CAPS Take 5,000 Units by mouth daily.    . Magnesium 400 MG CAPS Take 1 capsule by mouth daily at bedtime 90 capsule 1  . Multiple Vitamin (MULTIVITAMIN WITH MINERALS) TABS tablet Take 1 tablet by mouth daily.    . potassium chloride SA (KLOR-CON) 20 MEQ tablet Take 1 tablet (20 mEq total) by mouth daily. 5 tablet 0  . sertraline (ZOLOFT) 25 MG tablet Take  1 tablet (25 mg total) by mouth daily. 90 tablet 1  . Tetrahydrozoline HCl (VISINE OP) Place 2 drops into both eyes daily as needed (for dry/irritated eyes).      No current facility-administered medications for this visit.    Allergies:   Patient has no known allergies.    Social History:  The patient  reports that she has never smoked. She has never used smokeless tobacco. She reports previous alcohol use. She reports that she does not use drugs.   Family History:  The patient's ***family history includes Breast cancer in her mother; Colon cancer in her mother; Heart Problems in her mother; Heart disease in her father; Hypertension in her father and mother; Multiple myeloma in her father and mother.    ROS:  Please see the history of present illness.   Otherwise, review of systems are positive for {NONE DEFAULTED:18576::"none"}.   All other systems are reviewed and negative.    PHYSICAL EXAM: VS:  There were no vitals taken for this visit. , BMI There is no height or weight on file to calculate BMI. GENERAL:  Well appearing HEENT:  Pupils equal round and reactive, fundi not visualized, oral mucosa unremarkable NECK:  No jugular venous distention, waveform within normal limits, carotid upstroke brisk and symmetric, no bruits, no thyromegaly LYMPHATICS:  No cervical adenopathy LUNGS:  Clear to auscultation bilaterally HEART:  RRR.  PMI not displaced or sustained,S1 and S2 within normal limits, no S3, no S4, no clicks, no rubs, *** murmurs ABD:  Flat, positive bowel sounds normal in frequency in pitch, no bruits, no rebound, no guarding, no midline pulsatile mass, no hepatomegaly, no splenomegaly EXT:  2 plus pulses throughout, no edema, no cyanosis no clubbing SKIN:  No rashes no nodules NEURO:  Cranial nerves II through XII grossly intact, motor grossly intact throughout PSYCH:  Cognitively intact, oriented to person place and time    EKG:  EKG {ACTION; IS/IS IFX:25271292} ordered  today. The ekg ordered today demonstrates ***   Recent Labs: 12/22/2019: BUN 5; Creatinine, Ser 0.60; Magnesium 1.5; Potassium 2.9; Sodium 141; TSH 3.050 03/14/2020: ALT 27; Hemoglobin 16.2; Platelets 113    Lipid Panel    Component Value Date/Time   CHOL 177 03/23/2019 1517   TRIG 169 (H) 03/23/2019 1517   HDL 75 03/23/2019 1517   CHOLHDL 2.4 03/23/2019 1517   LDLCALC 74 03/23/2019 1517      Wt Readings from Last 3 Encounters:  03/30/20 159 lb 12.8 oz (72.5 kg)  03/30/20 159 lb 12.8 oz (72.5 kg)  03/14/20 161 lb (73 kg)      ASSESSMENT AND PLAN:  ***   Current medicines are reviewed at length with the patient today.  The patient {ACTIONS; HAS/DOES NOT HAVE:19233} concerns regarding medicines.  The following changes have been made:  {PLAN; NO CHANGE:13088:s}  Labs/ tests ordered today include: *** No orders of the defined types were placed in this encounter.    Disposition:   FU with ***     Signed, Tykia Mellone C. Oval Linsey, MD, [facility]  05/01/2020 8:07 AM    Meta

## 2020-05-05 ENCOUNTER — Encounter: Payer: Self-pay | Admitting: Cardiovascular Disease

## 2020-06-21 ENCOUNTER — Ambulatory Visit: Payer: Medicare HMO | Admitting: Internal Medicine

## 2020-08-12 ENCOUNTER — Inpatient Hospital Stay (HOSPITAL_COMMUNITY)
Admission: EM | Admit: 2020-08-12 | Discharge: 2020-08-15 | DRG: 379 | Disposition: A | Discharge status: Home / Self Care | Payer: Medicare HMO | Attending: Internal Medicine | Admitting: Internal Medicine

## 2020-08-12 ENCOUNTER — Encounter (HOSPITAL_COMMUNITY): Payer: Self-pay | Admitting: Emergency Medicine

## 2020-08-12 ENCOUNTER — Emergency Department (HOSPITAL_COMMUNITY): Payer: Medicare HMO

## 2020-08-12 ENCOUNTER — Other Ambulatory Visit: Payer: Self-pay

## 2020-08-12 DIAGNOSIS — K449 Diaphragmatic hernia without obstruction or gangrene: Secondary | ICD-10-CM | POA: Diagnosis not present

## 2020-08-12 DIAGNOSIS — K254 Chronic or unspecified gastric ulcer with hemorrhage: Secondary | ICD-10-CM | POA: Diagnosis present

## 2020-08-12 DIAGNOSIS — I739 Peripheral vascular disease, unspecified: Secondary | ICD-10-CM | POA: Diagnosis present

## 2020-08-12 DIAGNOSIS — R112 Nausea with vomiting, unspecified: Secondary | ICD-10-CM | POA: Diagnosis not present

## 2020-08-12 DIAGNOSIS — Z20822 Contact with and (suspected) exposure to covid-19: Secondary | ICD-10-CM | POA: Diagnosis present

## 2020-08-12 DIAGNOSIS — Z79899 Other long term (current) drug therapy: Secondary | ICD-10-CM | POA: Diagnosis not present

## 2020-08-12 DIAGNOSIS — K760 Fatty (change of) liver, not elsewhere classified: Secondary | ICD-10-CM | POA: Diagnosis present

## 2020-08-12 DIAGNOSIS — I10 Essential (primary) hypertension: Secondary | ICD-10-CM | POA: Diagnosis present

## 2020-08-12 DIAGNOSIS — K922 Gastrointestinal hemorrhage, unspecified: Secondary | ICD-10-CM | POA: Diagnosis present

## 2020-08-12 DIAGNOSIS — F32A Depression, unspecified: Secondary | ICD-10-CM | POA: Diagnosis present

## 2020-08-12 DIAGNOSIS — Z8249 Family history of ischemic heart disease and other diseases of the circulatory system: Secondary | ICD-10-CM | POA: Diagnosis not present

## 2020-08-12 DIAGNOSIS — Z89421 Acquired absence of other right toe(s): Secondary | ICD-10-CM | POA: Diagnosis not present

## 2020-08-12 DIAGNOSIS — K2981 Duodenitis with bleeding: Secondary | ICD-10-CM | POA: Diagnosis not present

## 2020-08-12 DIAGNOSIS — Z9851 Tubal ligation status: Secondary | ICD-10-CM | POA: Diagnosis not present

## 2020-08-12 DIAGNOSIS — K2900 Acute gastritis without bleeding: Secondary | ICD-10-CM | POA: Diagnosis not present

## 2020-08-12 DIAGNOSIS — R1110 Vomiting, unspecified: Secondary | ICD-10-CM | POA: Diagnosis not present

## 2020-08-12 DIAGNOSIS — E876 Hypokalemia: Secondary | ICD-10-CM | POA: Diagnosis present

## 2020-08-12 DIAGNOSIS — K2980 Duodenitis without bleeding: Secondary | ICD-10-CM | POA: Diagnosis present

## 2020-08-12 DIAGNOSIS — K219 Gastro-esophageal reflux disease without esophagitis: Secondary | ICD-10-CM | POA: Diagnosis present

## 2020-08-12 DIAGNOSIS — K920 Hematemesis: Secondary | ICD-10-CM | POA: Diagnosis not present

## 2020-08-12 DIAGNOSIS — D500 Iron deficiency anemia secondary to blood loss (chronic): Secondary | ICD-10-CM | POA: Diagnosis present

## 2020-08-12 DIAGNOSIS — R1084 Generalized abdominal pain: Secondary | ICD-10-CM | POA: Diagnosis not present

## 2020-08-12 DIAGNOSIS — I499 Cardiac arrhythmia, unspecified: Secondary | ICD-10-CM | POA: Diagnosis not present

## 2020-08-12 DIAGNOSIS — Z7982 Long term (current) use of aspirin: Secondary | ICD-10-CM | POA: Diagnosis not present

## 2020-08-12 DIAGNOSIS — K259 Gastric ulcer, unspecified as acute or chronic, without hemorrhage or perforation: Secondary | ICD-10-CM | POA: Diagnosis present

## 2020-08-12 DIAGNOSIS — K921 Melena: Secondary | ICD-10-CM | POA: Diagnosis not present

## 2020-08-12 DIAGNOSIS — R000 Tachycardia, unspecified: Secondary | ICD-10-CM | POA: Diagnosis not present

## 2020-08-12 DIAGNOSIS — R0902 Hypoxemia: Secondary | ICD-10-CM | POA: Diagnosis not present

## 2020-08-12 DIAGNOSIS — K2970 Gastritis, unspecified, without bleeding: Secondary | ICD-10-CM | POA: Diagnosis not present

## 2020-08-12 DIAGNOSIS — K3189 Other diseases of stomach and duodenum: Secondary | ICD-10-CM | POA: Diagnosis not present

## 2020-08-12 DIAGNOSIS — R197 Diarrhea, unspecified: Secondary | ICD-10-CM | POA: Diagnosis not present

## 2020-08-12 DIAGNOSIS — D62 Acute posthemorrhagic anemia: Secondary | ICD-10-CM | POA: Diagnosis not present

## 2020-08-12 LAB — CBC WITH DIFFERENTIAL/PLATELET
Abs Immature Granulocytes: 0.02 10*3/uL (ref 0.00–0.07)
Basophils Absolute: 0 10*3/uL (ref 0.0–0.1)
Basophils Relative: 1 %
Eosinophils Absolute: 0 10*3/uL (ref 0.0–0.5)
Eosinophils Relative: 0 %
HCT: 36.1 % (ref 36.0–46.0)
Hemoglobin: 12.3 g/dL (ref 12.0–15.0)
Immature Granulocytes: 0 %
Lymphocytes Relative: 14 %
Lymphs Abs: 0.7 10*3/uL (ref 0.7–4.0)
MCH: 35.4 pg — ABNORMAL HIGH (ref 26.0–34.0)
MCHC: 34.1 g/dL (ref 30.0–36.0)
MCV: 104 fL — ABNORMAL HIGH (ref 80.0–100.0)
Monocytes Absolute: 0.4 10*3/uL (ref 0.1–1.0)
Monocytes Relative: 9 %
Neutro Abs: 3.7 10*3/uL (ref 1.7–7.7)
Neutrophils Relative %: 76 %
Platelets: 105 10*3/uL — ABNORMAL LOW (ref 150–400)
RBC: 3.47 MIL/uL — ABNORMAL LOW (ref 3.87–5.11)
RDW: 18.3 % — ABNORMAL HIGH (ref 11.5–15.5)
WBC: 4.8 10*3/uL (ref 4.0–10.5)
nRBC: 0 % (ref 0.0–0.2)

## 2020-08-12 LAB — RESP PANEL BY RT-PCR (FLU A&B, COVID) ARPGX2
Influenza A by PCR: NEGATIVE
Influenza B by PCR: NEGATIVE
SARS Coronavirus 2 by RT PCR: NEGATIVE

## 2020-08-12 LAB — COMPREHENSIVE METABOLIC PANEL
ALT: 29 U/L (ref 0–44)
AST: 41 U/L (ref 15–41)
Albumin: 2.6 g/dL — ABNORMAL LOW (ref 3.5–5.0)
Alkaline Phosphatase: 122 U/L (ref 38–126)
Anion gap: 15 (ref 5–15)
BUN: 12 mg/dL (ref 8–23)
CO2: 27 mmol/L (ref 22–32)
Calcium: 7.9 mg/dL — ABNORMAL LOW (ref 8.9–10.3)
Chloride: 98 mmol/L (ref 98–111)
Creatinine, Ser: 0.54 mg/dL (ref 0.44–1.00)
GFR, Estimated: >60 mL/min (ref >60)
Glucose, Bld: 102 mg/dL — ABNORMAL HIGH (ref 70–99)
Potassium: 3.3 mmol/L — ABNORMAL LOW (ref 3.5–5.1)
Sodium: 140 mmol/L (ref 135–145)
Total Bilirubin: 1.1 mg/dL (ref 0.3–1.2)
Total Protein: 5.6 g/dL — ABNORMAL LOW (ref 6.5–8.1)

## 2020-08-12 LAB — URINALYSIS, ROUTINE W REFLEX MICROSCOPIC
Bilirubin Urine: NEGATIVE
Glucose, UA: NEGATIVE mg/dL
Hgb urine dipstick: NEGATIVE
Ketones, ur: 80 mg/dL — AB
Leukocytes,Ua: NEGATIVE
Nitrite: NEGATIVE
Protein, ur: NEGATIVE mg/dL
Specific Gravity, Urine: 1.02 (ref 1.005–1.030)
pH: 7 (ref 5.0–8.0)

## 2020-08-12 LAB — POC OCCULT BLOOD, ED: Fecal Occult Bld: POSITIVE — AB

## 2020-08-12 LAB — LIPASE, BLOOD: Lipase: 23 U/L (ref 11–51)

## 2020-08-12 IMAGING — CR DG CHEST 2V
2 series · 2 of 2 positions shown · non-contrast
Comparison: None.

CLINICAL DATA: Vomiting and diarrhea for the past 2 days.

EXAM:
CHEST - 2 VIEW

[w chest lat]
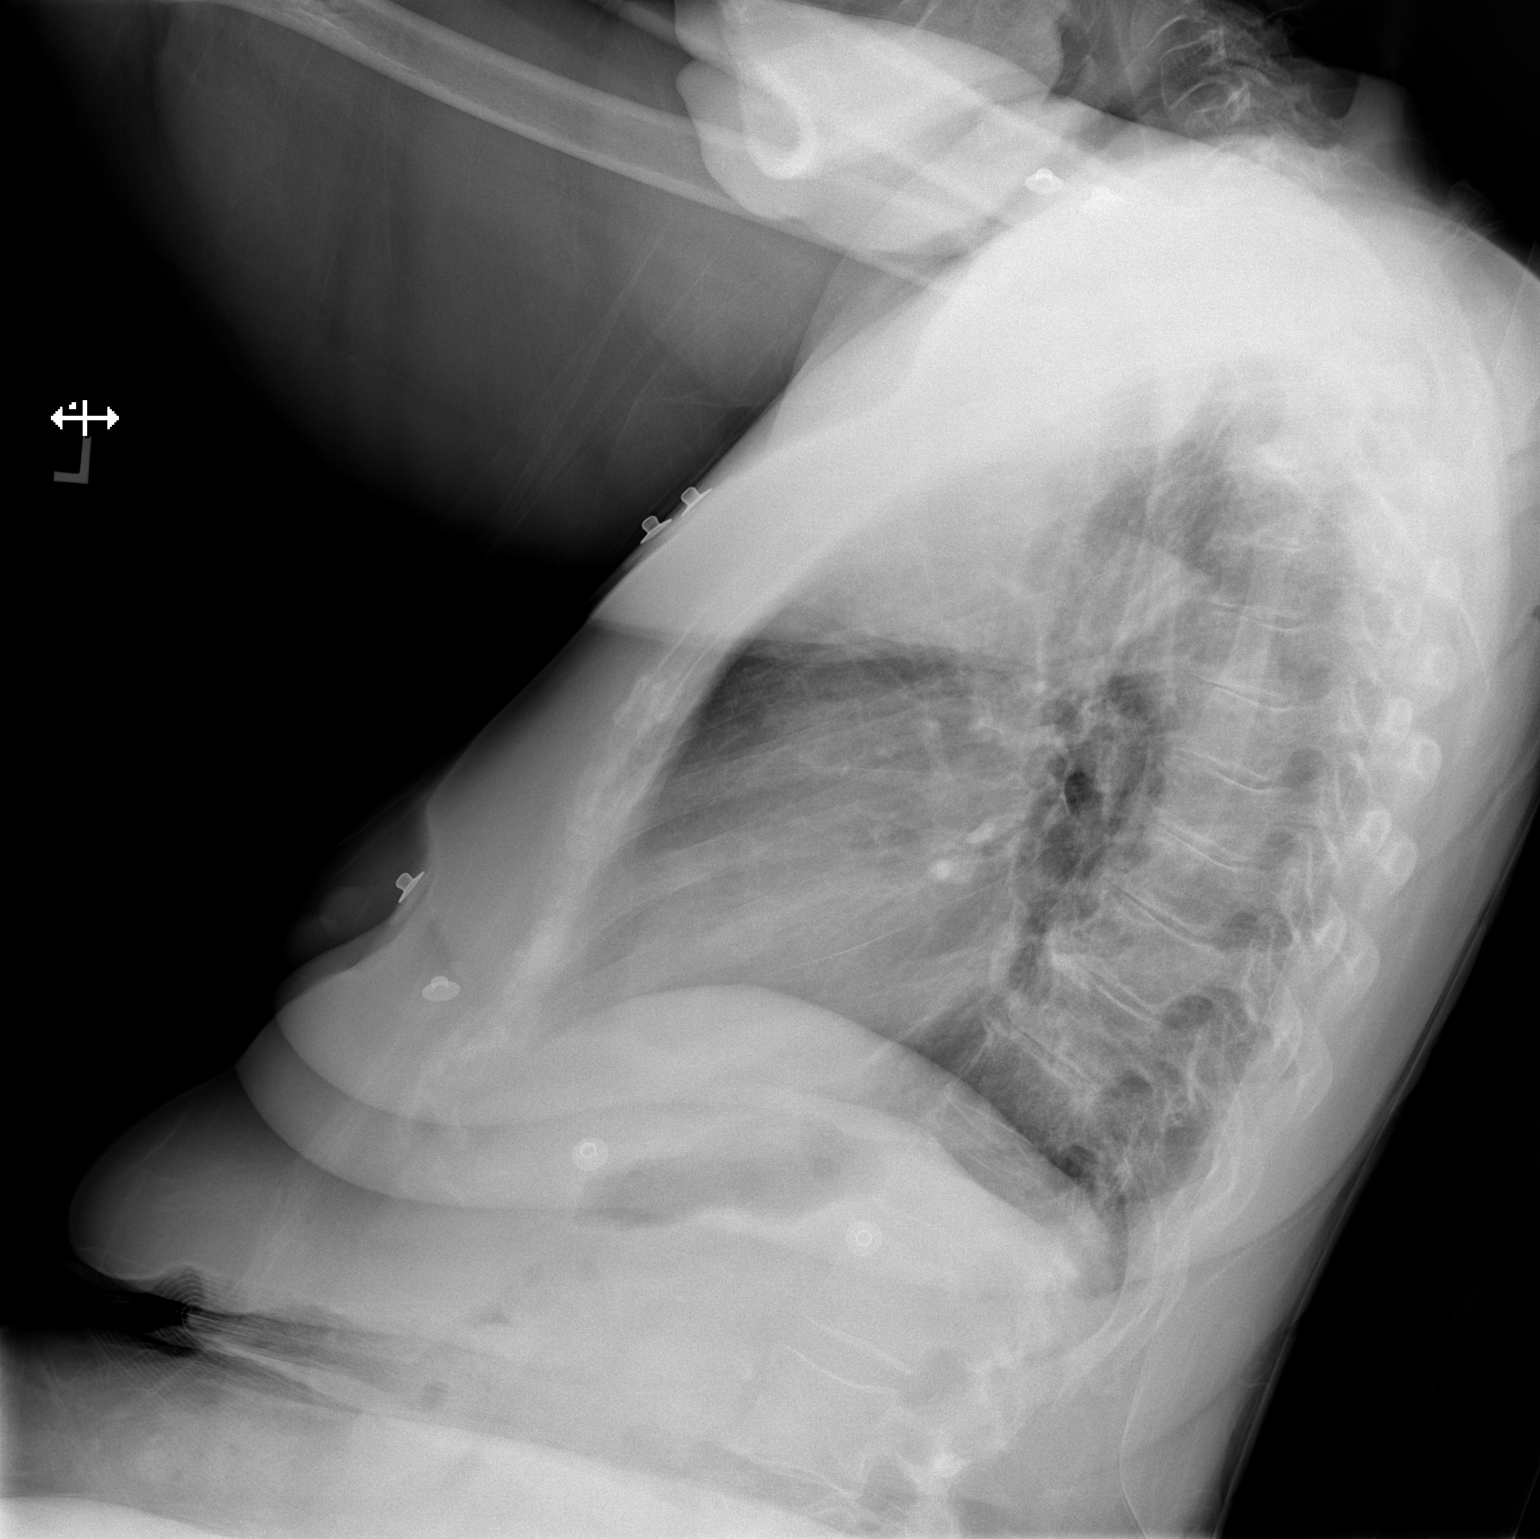

[x chest ap]
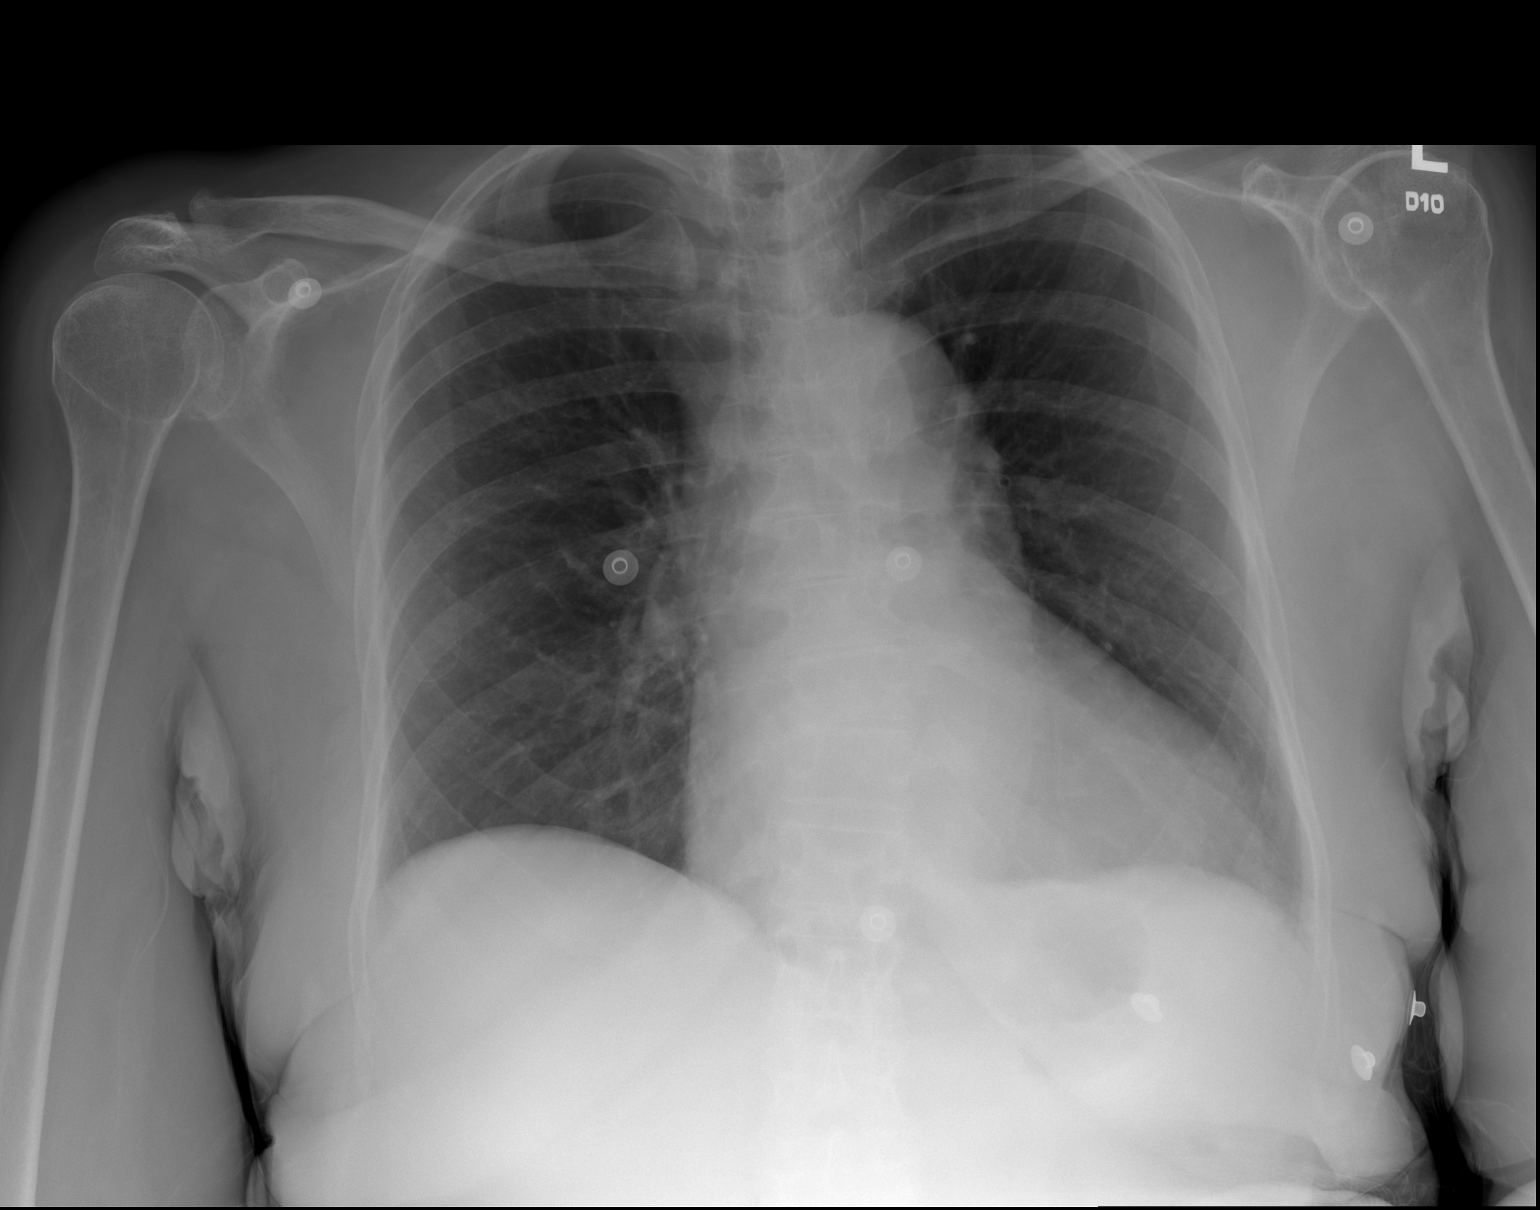

[2 of 2 positions shown; findings below may reference images not displayed]

FINDINGS: The heart size and mediastinal contours are within normal limits.
Both lungs are clear. The visualized skeletal structures are
unremarkable.
IMPRESSION: No active cardiopulmonary disease.

## 2020-08-12 MED ORDER — METOPROLOL TARTRATE 5 MG/5ML IV SOLN: 5.0000 mg | Freq: Four times a day (QID) | INTRAVENOUS | Status: DC | PRN

## 2020-08-12 MED ORDER — ONDANSETRON HCL 4 MG/2ML IJ SOLN
4.0000 mg | Freq: Once | INTRAMUSCULAR | Status: AC
  Administered 2020-08-12: 4 mg via INTRAVENOUS
  Filled 2020-08-12: qty 2

## 2020-08-12 MED ORDER — PANTOPRAZOLE SODIUM 40 MG IV SOLR: 40.0000 mg | Freq: Two times a day (BID) | INTRAVENOUS | Status: DC

## 2020-08-12 MED ORDER — SERTRALINE HCL 25 MG PO TABS
25.0000 mg | ORAL_TABLET | Freq: Every day | ORAL | Status: DC
  Administered 2020-08-13 – 2020-08-15 (×3): 25 mg via ORAL
  Filled 2020-08-12 (×3): qty 1

## 2020-08-12 MED ORDER — POTASSIUM CHLORIDE CRYS ER 20 MEQ PO TBCR
40.0000 meq | EXTENDED_RELEASE_TABLET | Freq: Once | ORAL | Status: AC
  Administered 2020-08-12: 40 meq via ORAL
  Filled 2020-08-12: qty 2

## 2020-08-12 MED ORDER — ACETAMINOPHEN 650 MG RE SUPP: 650.0000 mg | Freq: Four times a day (QID) | RECTAL | Status: DC | PRN

## 2020-08-12 MED ORDER — ACETAMINOPHEN 325 MG PO TABS
650.0000 mg | ORAL_TABLET | Freq: Four times a day (QID) | ORAL | Status: DC | PRN
  Administered 2020-08-12 – 2020-08-13 (×2): 650 mg via ORAL
  Filled 2020-08-12 (×2): qty 2

## 2020-08-12 MED ORDER — SODIUM CHLORIDE 0.9 % IV SOLN
80.0000 mg | Freq: Once | INTRAVENOUS | Status: AC
  Administered 2020-08-12: 80 mg via INTRAVENOUS
  Filled 2020-08-12: qty 80

## 2020-08-12 MED ORDER — SODIUM CHLORIDE 0.9 % IV SOLN
8.0000 mg/h | INTRAVENOUS | Status: DC
  Administered 2020-08-12 – 2020-08-15 (×6): 8 mg/h via INTRAVENOUS
  Filled 2020-08-12 (×7): qty 80

## 2020-08-12 MED ORDER — IOHEXOL 9 MG/ML PO SOLN
ORAL | Status: AC
  Filled 2020-08-12: qty 1000

## 2020-08-12 MED ORDER — ONDANSETRON HCL 4 MG/2ML IJ SOLN: 4.0000 mg | Freq: Four times a day (QID) | INTRAMUSCULAR | Status: DC | PRN

## 2020-08-12 MED ORDER — AMLODIPINE BESYLATE 5 MG PO TABS
5.0000 mg | ORAL_TABLET | Freq: Every day | ORAL | Status: DC
  Administered 2020-08-13 – 2020-08-15 (×3): 5 mg via ORAL
  Filled 2020-08-12 (×3): qty 1

## 2020-08-12 MED ORDER — ONDANSETRON HCL 4 MG PO TABS: 4.0000 mg | ORAL_TABLET | Freq: Four times a day (QID) | ORAL | Status: DC | PRN

## 2020-08-12 MED ORDER — IOHEXOL 9 MG/ML PO SOLN: 500.0000 mL | ORAL | Status: DC

## 2020-08-12 MED ORDER — IOHEXOL 300 MG/ML  SOLN: 100.0000 mL | Freq: Once | INTRAMUSCULAR | Status: DC | PRN

## 2020-08-12 MED ORDER — SODIUM CHLORIDE 0.9 % IV SOLN: INTRAVENOUS | Status: DC

## 2020-08-12 NOTE — ED Triage Notes (Signed)
Patient presents form home with complaints of abdominal pain for 2 days, vomiting and diarrhea. Currently she is not experiencing abdominal pain, but she want to come to the hospital due to 3 episodes of emesis which she believed contained blood. She has also had diarrhea sine she started taking supplements.  HX hypertension  EMS vitals: 170/92 BP 100-124 HR 100 % Room air 18 Resp Rate 132 CBG 97.7 Temp

## 2020-08-12 NOTE — ED Notes (Signed)
ED TO INPATIENT HANDOFF REPORT  Name/Age/Gender Debbie Barry 70 y.o. female  Code Status   Home/SNF/Other Home  Chief Complaint UGIB (upper gastrointestinal bleed) [K92.2]  Level of Care/Admitting Diagnosis ED Disposition    ED Disposition Condition [facility] Area: [place] [[id number]]  Level of Care: Med-Surg [16]  Covid Evaluation: Asymptomatic Screening Protocol (No Symptoms)  Diagnosis: UGIB (upper gastrointestinal bleed) [937902]  Admitting Physician: Dessa Phi [[id number]]  Attending Physician: Dessa Phi [phone]       Medical History Past Medical History:  Diagnosis Date  . Fatty liver   . GERD (gastroesophageal reflux disease)   . Hypertension   . Osteomyelitis ([place])    right third toe  . Peripheral vascular disease ([place])     Allergies No Known Allergies  IV Location/Drains/Wounds Patient Lines/Drains/Airways Status    Active Line/Drains/Airways    Name Placement date Placement time Site Days   Peripheral IV 07/29/17 Right Antecubital 07/29/17  1811  Antecubital  1110   Peripheral IV 08/12/20 Antecubital 08/12/20  1658  Antecubital  less than 1   Incision (Closed) 08/08/17 Leg Right 08/08/17  1432  -- 1100          Labs/Imaging Results for orders placed or performed during the hospital encounter of 08/12/20 (from the past 48 hour(s))  Urinalysis, Routine w reflex microscopic Urine, Clean Catch     Status: Abnormal   Collection Time: 08/12/20 12:23 PM  Result Value Ref Range   Color, Urine YELLOW YELLOW   APPearance CLEAR CLEAR   Specific Gravity, Urine 1.020 1.005 - 1.030   pH 7.0 5.0 - 8.0   Glucose, UA NEGATIVE NEGATIVE mg/dL   Hgb urine dipstick NEGATIVE NEGATIVE   Bilirubin Urine NEGATIVE NEGATIVE   Ketones, ur 80 (A) NEGATIVE mg/dL   Protein, ur NEGATIVE NEGATIVE mg/dL   Nitrite NEGATIVE NEGATIVE   Leukocytes,Ua NEGATIVE NEGATIVE    Comment: Performed at [facility],  [place] [address], [place], [place] [address]  CBC with Differential     Status: Abnormal   Collection Time: 08/12/20  1:22 PM  Result Value Ref Range   WBC 4.8 4.0 - 10.5 K/uL   RBC 3.47 (L) 3.87 - 5.11 MIL/uL   Hemoglobin 12.3 12.0 - 15.0 g/dL   HCT 36.1 36.0 - 46.0 %   MCV 104.0 (H) 80.0 - 100.0 fL   MCH 35.4 (H) 26.0 - 34.0 pg   MCHC 34.1 30.0 - 36.0 g/dL   RDW 18.3 (H) 11.5 - 15.5 %   Platelets 105 (L) 150 - 400 K/uL    Comment: PLATELET COUNT CONFIRMED BY SMEAR   nRBC 0.0 0.0 - 0.2 %   Neutrophils Relative % 76 %   Neutro Abs 3.7 1.7 - 7.7 K/uL   Lymphocytes Relative 14 %   Lymphs Abs 0.7 0.7 - 4.0 K/uL   Monocytes Relative 9 %   Monocytes Absolute 0.4 0.1 - 1.0 K/uL   Eosinophils Relative 0 %   Eosinophils Absolute 0.0 0.0 - 0.5 K/uL   Basophils Relative 1 %   Basophils Absolute 0.0 0.0 - 0.1 K/uL   Immature Granulocytes 0 %   Abs Immature Granulocytes 0.02 0.00 - 0.07 K/uL    Comment: Performed at [facility], [place] [address] [place], [place] [address]  Comprehensive metabolic panel     Status: Abnormal   Collection Time: 08/12/20  2:23 PM  Result Value Ref Range   Sodium 140  135 - 145 mmol/L   Potassium 3.3 (L) 3.5 - 5.1 mmol/L   Chloride 98 98 - 111 mmol/L   CO2 27 22 - 32 mmol/L   Glucose, Bld 102 (H) 70 - 99 mg/dL    Comment: Glucose reference range applies only to samples taken after fasting for at least 8 hours.   BUN 12 8 - 23 mg/dL   Creatinine, Ser 0.54 0.44 - 1.00 mg/dL   Calcium 7.9 (L) 8.9 - 10.3 mg/dL   Total Protein 5.6 (L) 6.5 - 8.1 g/dL   Albumin 2.6 (L) 3.5 - 5.0 g/dL   AST 41 15 - 41 U/L   ALT 29 0 - 44 U/L   Alkaline Phosphatase 122 38 - 126 U/L   Total Bilirubin 1.1 0.3 - 1.2 mg/dL   GFR, Estimated >60 >60 mL/min    Comment: (NOTE) Calculated using the CKD-EPI Creatinine Equation (2021)    Anion gap 15 5 - 15    Comment: Performed at [facility], [place] [address], [place], [place] [address]  Lipase,  blood     Status: None   Collection Time: 08/12/20  2:23 PM  Result Value Ref Range   Lipase 23 11 - 51 U/L    Comment: Performed at [facility], [place] [address], [place], [place] [address]  POC occult blood, ED     Status: Abnormal   Collection Time: 08/12/20  5:14 PM  Result Value Ref Range   Fecal Occult Bld POSITIVE (A) NEGATIVE  Resp Panel by RT-PCR (Flu A&B, Covid) Nasopharyngeal Swab     Status: None   Collection Time: 08/12/20  8:02 PM   Specimen: Nasopharyngeal Swab; Nasopharyngeal(NP) swabs in vial transport medium  Result Value Ref Range   SARS Coronavirus 2 by RT PCR NEGATIVE NEGATIVE    Comment: (NOTE) SARS-CoV-2 target nucleic acids are NOT DETECTED.  The SARS-CoV-2 RNA is generally detectable in upper respiratory specimens during the acute phase of infection. The lowest concentration of SARS-CoV-2 viral copies this assay can detect is 138 copies/mL. A negative result does not preclude SARS-Cov-2 infection and should not be used as the sole basis for treatment or other patient management decisions. A negative result may occur with  improper specimen collection/handling, submission of specimen other than nasopharyngeal swab, presence of viral mutation(s) within the areas targeted by this assay, and inadequate number of viral copies(<138 copies/mL). A negative result must be combined with clinical observations, patient history, and epidemiological information. The expected result is Negative.  Fact Sheet for Patients:  EntrepreneurPulse.com.au  Fact Sheet for Healthcare Providers:  IncredibleEmployment.be  This test is no t yet approved or cleared by the [place] FDA and  has been authorized for detection and/or diagnosis of SARS-CoV-2 by FDA under an Emergency Use Authorization (EUA). This EUA will remain  in effect (meaning this test can be used) for the duration of the COVID-19 declaration under Section  564(b)(1) of the Act, 21 U.S.C.section 360bbb-3(b)(1), unless the authorization is terminated  or revoked sooner.       Influenza A by PCR NEGATIVE NEGATIVE   Influenza B by PCR NEGATIVE NEGATIVE    Comment: (NOTE) The Xpert Xpress SARS-CoV-2/FLU/RSV plus assay is intended as an aid in the diagnosis of influenza from Nasopharyngeal swab specimens and should not be used as a sole basis for treatment. Nasal washings and aspirates are unacceptable for Xpert Xpress SARS-CoV-2/FLU/RSV testing.  Fact Sheet for Patients: EntrepreneurPulse.com.au  Fact Sheet for Healthcare Providers:  IncredibleEmployment.be  This test is not yet approved or cleared by the [place] and has been authorized for detection and/or diagnosis of SARS-CoV-2 by FDA under an Emergency Use Authorization (EUA). This EUA will remain in effect (meaning this test can be used) for the duration of the COVID-19 declaration under Section 564(b)(1) of the Act, 21 U.S.C. section 360bbb-3(b)(1), unless the authorization is terminated or revoked.  Performed at [facility], [place] [address] [place], [place] [address]    DG Chest 2 View  Result Date: 08/12/2020 CLINICAL DATA:  Vomiting and diarrhea for the past 2 days. EXAM: CHEST - 2 VIEW COMPARISON:  None. FINDINGS: The heart size and mediastinal contours are within normal limits. Both lungs are clear. The visualized skeletal structures are unremarkable. IMPRESSION: No active cardiopulmonary disease. Electronically Signed   By: Titus Dubin M.D.   On: 08/12/2020 13:02    Pending Labs Unresulted Labs (From admission, onward)          Start     Ordered   08/12/20 1223  Urine culture  ONCE - STAT,   STAT        08/12/20 1223   Signed and Held  HIV Antibody (routine testing w rflx)  (HIV Antibody (Routine testing w reflex) panel)  Once,   R        Signed and Held   Signed and Held  CBC  Tomorrow morning,    R        Signed and Held   Signed and Held  Basic metabolic panel  Tomorrow morning,   R        Signed and Held          Vitals/Pain Today's Vitals   08/12/20 2100 08/12/20 2130 08/12/20 2200 08/12/20 2226  BP: (!) 147/80 138/76 (!) 147/85   Pulse: (!) 101 92 94   Resp: 12 14 13    Temp:    98.7 F (37.1 C)  TempSrc:    Oral  SpO2: 99% 99% 100%     Isolation Precautions Airborne and Contact precautions  Medications Medications  iohexol (OMNIPAQUE) 300 MG/ML solution 100 mL (has no administration in time range)  iohexol (OMNIPAQUE) 9 MG/ML oral solution 500 mL (has no administration in time range)  iohexol (OMNIPAQUE) 9 MG/ML oral solution (has no administration in time range)  pantoprazole (PROTONIX) 80 mg in sodium chloride 0.9 % 100 mL (0.8 mg/mL) infusion (8 mg/hr Intravenous New Bag/Given 08/12/20 1934)  pantoprazole (PROTONIX) injection 40 mg (has no administration in time range)  ondansetron (ZOFRAN) injection 4 mg (4 mg Intravenous Given 08/12/20 1927)  pantoprazole (PROTONIX) 80 mg in sodium chloride 0.9 % 100 mL IVPB (0 mg Intravenous Stopped 08/12/20 1955)    Mobility walks

## 2020-08-12 NOTE — ED Provider Notes (Addendum)
[facility]-EMERGENCY DEPT Provider Note   CSN: [id number] Arrival date & time: 08/12/20  1109     History Chief Complaint  Patient presents with  . Emesis  . Abdominal Pain    Debbie Barry is a 70 y.o. female who presents with 2 days of generalized abdominal pain, worse in the right upper quadrant and epigastrum, now with nausea and 3 episodes of vomiting today.  She states that her vomit was burgundy in color this morning x 3, bloody.  She endorses drinking dark red drink last night, fruit punch.  She has not had any further emesis this morning.  Patient endorses persistent loose stools for several months, endorses dark black sticky stool for the last month to 2 months.  She endorses melena, denies hematochezia.  Denies bilious emesis. Denies CP, SOB, Palpitations at this time.   I personally read this patient's medical records.  History of hypertension, peripheral vascular disease, and GERD.  Patient is on amlodipine, but no longer takes carvedilol.  HPI     Past Medical History:  Diagnosis Date  . Fatty liver   . GERD (gastroesophageal reflux disease)   . Hypertension   . Osteomyelitis ([facility])    right third toe  . Peripheral vascular disease [facility])     Patient Active Problem List   Diagnosis Date Noted  . UGIB (upper gastrointestinal bleed) 08/12/2020  . Essential hypertension, benign 03/23/2019    Past Surgical History:  Procedure Laterality Date  . AMPUTATION Right 08/08/2017   Procedure: AMPUTATION RIGHT 3RD TOE;  Surgeon: Nadara Mustard, MD;  Location: [facility] OR;  Service: Orthopedics;  Laterality: Right;  . Bunionectomy Right 2006     Right  . Bunionectony Left 2006      August  . COLONOSCOPY N/A 01/21/2014   Procedure: COLONOSCOPY;  Surgeon: Theda Belfast, MD;  Location: [facility] ENDOSCOPY;  Service: Endoscopy;  Laterality: N/A;  . TUBAL LIGATION    . WISDOM TOOTH EXTRACTION       OB History   No obstetric history on file.     Family  History  Problem Relation Age of Onset  . Breast cancer Mother   . Colon cancer Mother   . Heart Problems Mother   . Hypertension Mother   . Multiple myeloma Mother   . Hypertension Father   . Heart disease Father   . Multiple myeloma Father     Social History   Tobacco Use  . Smoking status: Never Smoker  . Smokeless tobacco: Never Used  Vaping Use  . Vaping Use: Never used  Substance Use Topics  . Alcohol use: Not Currently    Comment: social glass of wine  . Drug use: No    Home Medications Prior to Admission medications   Medication Sig Start Date End Date Taking? Authorizing Provider  acetaminophen (TYLENOL) 650 MG CR tablet Take 650 mg by mouth every 8 (eight) hours as needed for pain.   Yes [provider]  amLODipine (NORVASC) 5 MG tablet TAKE 1 TABLET(5 MG) BY MOUTH DAILY 03/07/20  Yes Dorothyann Peng, MD  aspirin EC 325 MG tablet Take 325 mg by mouth daily.   Yes [provider]  Cholecalciferol (VITAMIN D3) 5000 units CAPS Take 5,000 Units by mouth daily.   Yes [provider]  Magnesium 400 MG CAPS Take 1 capsule by mouth daily at bedtime 12/23/19  Yes Dorothyann Peng, MD  Multiple Vitamin (MULTIVITAMIN WITH MINERALS) TABS tablet Take 1 tablet by  mouth daily.   Yes [provider]  naproxen sodium (ALEVE) 220 MG tablet Take 220 mg by mouth daily as needed (pain).   Yes [provider]  potassium chloride SA (KLOR-CON) 20 MEQ tablet Take 1 tablet (20 mEq total) by mouth daily. 12/23/19  Yes Glendale Chard, MD  sertraline (ZOLOFT) 25 MG tablet Take 1 tablet (25 mg total) by mouth daily. 12/22/19  Yes Glendale Chard, MD  carvedilol (COREG) 12.5 MG tablet Take 1 tablet (12.5 mg total) by mouth 2 (two) times daily with a meal. Patient not taking: Reported on 08/12/2020 01/26/19   Glendale Chard, MD  Tetrahydrozoline HCl (VISINE OP) Place 2 drops into both eyes daily as needed (for dry/irritated eyes).     [provider]     Allergies    Patient has no known allergies.  Review of Systems   Review of Systems  Constitutional: Positive for activity change and fatigue. Negative for chills and fever.  HENT: Negative.   Respiratory: Negative.   Cardiovascular: Positive for leg swelling.  Gastrointestinal: Positive for abdominal pain, nausea and vomiting. Negative for blood in stool and diarrhea.  Genitourinary: Positive for dysuria. Negative for frequency, hematuria, urgency, vaginal bleeding and vaginal discharge.  Musculoskeletal: Negative.   Skin: Negative.   Neurological: Negative.   Hematological: Negative.     Physical Exam Updated Vital Signs BP (!) 141/98   Pulse (!) 115   Temp 97.9 F (36.6 C)   Resp (!) 48   SpO2 100%   Physical Exam Vitals and nursing note reviewed.  Constitutional:      Appearance: She is overweight. She is not ill-appearing.  HENT:     Head: Normocephalic and atraumatic.     Nose: Nose normal.     Mouth/Throat:     Mouth: Mucous membranes are moist.     Pharynx: Oropharynx is clear. Uvula midline. No oropharyngeal exudate or posterior oropharyngeal erythema.     Tonsils: No tonsillar exudate.  Eyes:     General: Lids are normal. Vision grossly intact.        Right eye: No discharge.        Left eye: No discharge.     Extraocular Movements: Extraocular movements intact.     Conjunctiva/sclera: Conjunctivae normal.     Pupils: Pupils are equal, round, and reactive to light.  Neck:     Trachea: Trachea and phonation normal.  Cardiovascular:     Rate and Rhythm: Regular rhythm. Tachycardia present.     Pulses: Normal pulses.          Radial pulses are 2+ on the right side and 2+ on the left side.       Dorsalis pedis pulses are 2+ on the right side and 2+ on the left side.     Heart sounds: Normal heart sounds. No murmur heard.   Pulmonary:     Effort: Pulmonary effort is normal. No accessory muscle usage, prolonged expiration or respiratory distress.      Breath sounds: Normal breath sounds. No wheezing or rales.  Chest:     Chest wall: No deformity, swelling, tenderness or crepitus.  Abdominal:     General: Bowel sounds are normal. There is no distension.     Palpations: Abdomen is soft.     Tenderness: There is generalized abdominal tenderness and tenderness in the right upper quadrant and epigastric area. There is no guarding or rebound. Negative signs include Murphy's sign, Rovsing's sign and McBurney's sign.  Musculoskeletal:  General: No deformity.     Cervical back: Neck supple. No crepitus. No pain with movement or spinous process tenderness.     Right lower leg: 1+ Pitting Edema present.     Left lower leg: 1+ Pitting Edema present.  Lymphadenopathy:     Cervical: No cervical adenopathy.  Skin:    General: Skin is warm and dry.     Capillary Refill: Capillary refill takes less than 2 seconds.  Neurological:     General: No focal deficit present.     Mental Status: She is alert and oriented to person, place, and time. Mental status is at baseline.     Cranial Nerves: Cranial nerves are intact.     Sensory: Sensation is intact.     Motor: Motor function is intact.     Gait: Gait is intact.  Psychiatric:        Mood and Affect: Mood normal.     ED Results / Procedures / Treatments   Labs (all labs ordered are listed, but only abnormal results are displayed) Labs Reviewed  CBC WITH DIFFERENTIAL/PLATELET - Abnormal; Notable for the following components:      Result Value   RBC 3.47 (*)    MCV 104.0 (*)    MCH 35.4 (*)    RDW 18.3 (*)    Platelets 105 (*)    All other components within normal limits  URINALYSIS, ROUTINE W REFLEX MICROSCOPIC - Abnormal; Notable for the following components:   Ketones, ur 80 (*)    All other components within normal limits  COMPREHENSIVE METABOLIC PANEL - Abnormal; Notable for the following components:   Potassium 3.3 (*)    Glucose, Bld 102 (*)    Calcium 7.9 (*)    Total  Protein 5.6 (*)    Albumin 2.6 (*)    All other components within normal limits  POC OCCULT BLOOD, ED - Abnormal; Notable for the following components:   Fecal Occult Bld POSITIVE (*)    All other components within normal limits  URINE CULTURE  RESP PANEL BY RT-PCR (FLU A&B, COVID) [id number]  LIPASE, BLOOD    EKG EKG Interpretation  Date/Time:  Saturday August 12 2020 12:48:34 EST Ventricular Rate:  101 PR Interval:    QRS Duration: 155 QT Interval:  352 QTC Calculation: 457 R Axis:   -17 Text Interpretation: Sinus tachycardia Ventricular premature complex Probable left atrial enlargement similar to old. no STEMI Confirmed by Charlesetta Shanks [phone]) on 08/12/2020 3:28:02 PM   Radiology DG Chest 2 View  Result Date: 08/12/2020 CLINICAL DATA:  Vomiting and diarrhea for the past 2 days. EXAM: CHEST - 2 VIEW COMPARISON:  None. FINDINGS: The heart size and mediastinal contours are within normal limits. Both lungs are clear. The visualized skeletal structures are unremarkable. IMPRESSION: No active cardiopulmonary disease. Electronically Signed   By: Titus Dubin M.D.   On: 08/12/2020 13:02    Procedures Procedures   Medications Ordered in ED Medications  iohexol (OMNIPAQUE) 300 MG/ML solution 100 mL (has no administration in time range)  iohexol (OMNIPAQUE) 9 MG/ML oral solution 500 mL (has no administration in time range)  iohexol (OMNIPAQUE) 9 MG/ML oral solution (has no administration in time range)  ondansetron (ZOFRAN) injection 4 mg (has no administration in time range)    ED Course  I have reviewed the triage vital signs and the nursing notes.  Pertinent labs & imaging results that were available during my care of the patient were reviewed by me and  considered in my medical decision making (see chart for details).  Clinical Course as of 08/13/20 5726  Sat Aug 12, 2020  1651 Patient required US guided IV placement, and unfortunately lost her IV acces. RN unable to  secure new access. Noncon CT will not be sufficient for evaluation. Discussed with attending physician. No CT at this time. Given concern for upper GI bleed, feel she would benefit from admission to the hospital at this time.  [RS]  2035 Patient with episode of hematemesis while this provider was in the room. Antiemetic offered.  [RS]  5974 Consult placed to hospitalist, Dr. Maylene Roes, who is agreeable to seeing this patient and admitting her to her service. I appreciate her collaboration in the care of this patient.  Secure message sent to GI, Dr. Stacie Glaze GI, regarding consult on this patient tomorrow.  [RS]    Clinical Course User Index [RS] Bhumi Godbey, Sharlene Dory   MDM Rules/Calculators/A&P                         70 year old female presents with concern for approximate 1 week of abdominal pain now with nausea and 3 episodes of vomiting today.  Possible bloody vomiting, endorses melena.  Differential diagnosis for this patient includes but is not limited to Peptic ulcer disease, gastritis/esophagitis, esophageal varices, mallory-weiss tear, malignancy, aortoenteric fistula, boerhaave syndrome, celiac disease, ENT bleeding.   Patient hypertensive on intake to 150/108, hypertensive to 110s.  Cardiac exam revealed persistent mild tachycardia with heart rate of 105, pulmonary exam is reassuring.  Abdominal exam revealed generalized tenderness, worse in the right upper quadrant and epigastrium.  No palpable masses, rebound tenderness, no Murphy's or McBurney's point tenderness to palpation.  Patient denies abdominal pain or nausea at this time without palpation.  Will proceed with basic laboratory studies, EKG, and chest x-ray. Will consider CT abdomen and pelvis given generalized abdominal TTP.   CXR negative for acute cardiopulmonary disease.  EKG unchanged from prior, no STEMI. CBC without leukocytosis or anemia however Hbg decreased from 16 to 12, mild thrombocytopenia of 105,000.  CMP with  mild hypokalemia 3.3, normal BUN and Cr. UA unremarkable. Hemoccult +.   HPI and Laboratory studies up to this point concerning for upper GI bleed; given tachycardia and likely upper GI bleed in this 70 y/o patient, feel she would benefit from admission to the hospital at this time. Consult to hospitalist as above, COVID test pending.   Patient is amenable to plan for admission to the hospital this time.  Each of her questions was  answered to her expressed satisfaction.  This chart was dictated using voice recognition software, Dragon. Despite the best efforts of this provider to proofread and correct errors, errors may still occur which can change documentation meaning.   Final Clinical Impression(s) / ED Diagnoses Final diagnoses:  None    Rx / DC Orders ED Discharge Orders    None       Emeline Darling, PA-C 08/12/20 1804    Orra Nolde, Gypsy Balsam, PA-C 08/13/20 1638    Varney Biles, MD 08/13/20 1554

## 2020-08-12 NOTE — H&P (Signed)
History and Physical    Debbie Barry GGE:[id number] DOB: 1951/01/07 DOA: 08/12/2020  PCP: Glendale Chard, MD  Patient coming from: Home  Chief Complaint: Abdominal pain   HPI: Debbie Barry is a 70 y.o. female with medical history significant of HTN, GERD who presents with generalized abdominal pain, hematemesis over the past couple of days as well as melena over the past couple of months. She thought her magnesium supplementation was making her stools dark. She takes aspirin $RemoveBefor'325mg'OckrbfsObkPA$  daily and aleve PRN (last dose yesterday). She states that she fell a few days ago and since then has been taking 1 aleve per day. She admits to lightheadedness and dizziness, denies CP, SOB, cough.     ED Course: Work up positive for +FOBT, Hgb 12.3. She remains hypertensive in the ED. Asked ED PA to consult GI to see in AM as well as add on COVID screening test for admission.   Review of Systems: As per HPI. Otherwise, all other review of systems reviewed and are negative.   Past Medical History:  Diagnosis Date  . Fatty liver   . GERD (gastroesophageal reflux disease)   . Hypertension   . Osteomyelitis ([place])    right third toe  . Peripheral vascular disease [facility])     Past Surgical History:  Procedure Laterality Date  . AMPUTATION Right 08/08/2017   Procedure: AMPUTATION RIGHT 3RD TOE;  Surgeon: Newt Minion, MD;  Location: [place];  Service: Orthopedics;  Laterality: Right;  . Bunionectomy Right 2006     Right  . Bunionectony Left 2006      August  . COLONOSCOPY N/A 01/21/2014   Procedure: COLONOSCOPY;  Surgeon: Beryle Beams, MD;  Location: [facility];  Service: Endoscopy;  Laterality: N/A;  . TUBAL LIGATION    . WISDOM TOOTH EXTRACTION       reports that she has never smoked. She has never used smokeless tobacco. She reports previous alcohol use. She reports that she does not use drugs.  No Known Allergies  Family History  Problem Relation Age of Onset  . Breast cancer Mother    . Colon cancer Mother   . Heart Problems Mother   . Hypertension Mother   . Multiple myeloma Mother   . Hypertension Father   . Heart disease Father   . Multiple myeloma Father     Prior to Admission medications   Medication Sig Start Date End Date Taking? Authorizing Provider  acetaminophen (TYLENOL) 650 MG CR tablet Take 650 mg by mouth every 8 (eight) hours as needed for pain.   Yes [provider]  amLODipine (NORVASC) 5 MG tablet TAKE 1 TABLET(5 MG) BY MOUTH DAILY 03/07/20  Yes Glendale Chard, MD  aspirin EC 325 MG tablet Take 325 mg by mouth daily.   Yes [provider]  Cholecalciferol (VITAMIN D3) 5000 units CAPS Take 5,000 Units by mouth daily.   Yes [provider]  Magnesium 400 MG CAPS Take 1 capsule by mouth daily at bedtime 12/23/19  Yes Glendale Chard, MD  Multiple Vitamin (MULTIVITAMIN WITH MINERALS) TABS tablet Take 1 tablet by mouth daily.   Yes [provider]  naproxen sodium (ALEVE) 220 MG tablet Take 220 mg by mouth daily as needed (pain).   Yes [provider]  potassium chloride SA (KLOR-CON) 20 MEQ tablet Take 1 tablet (20 mEq total) by mouth daily. 12/23/19  Yes Glendale Chard, MD  sertraline (ZOLOFT) 25 MG tablet Take 1 tablet (25 mg total)  by mouth daily. 12/22/19  Yes Glendale Chard, MD  carvedilol (COREG) 12.5 MG tablet Take 1 tablet (12.5 mg total) by mouth 2 (two) times daily with a meal. Patient not taking: Reported on 08/12/2020 01/26/19   Glendale Chard, MD  Tetrahydrozoline HCl (VISINE OP) Place 2 drops into both eyes daily as needed (for dry/irritated eyes).     [provider]    Physical Exam: Vitals:   08/12/20 1230 08/12/20 1500 08/12/20 1656 08/12/20 1700  BP: (!) 144/96 (!) 155/102 (!) 146/92 (!) 141/98  Pulse: (!) 104 90 (!) 101 (!) 115  Resp: (!) 27 (!) 48    Temp:      SpO2: 98% 99% 93% 100%     Constitutional: NAD, calm, comfortable Eyes: PERRL, lids and conjunctivae normal ENMT:  Deferred as patient wearing a face mask  Respiratory: Clear to auscultation bilaterally, no wheezing, no crackles. Normal respiratory effort. No accessory muscle use. No conversational dyspnea  Cardiovascular: tachycardic rate and regular rhythm rhythm, no murmurs. No extremity edema.  Abdomen: Soft, nondistended, nontender to palpation. Bowel sounds positive.  Musculoskeletal: No joint deformity upper and lower extremities. No contractures. Normal muscle tone.  Skin: no rashes, lesions, ulcers on exposed skin  Neurologic: Alert and oriented, speech fluent, CN 2-12 grossly intact. No focal deficits.   Psychiatric: Normal judgment and insight. Normal mood and affect   Labs on Admission: I have personally reviewed following labs and imaging studies  CBC: Recent Labs  Lab 08/12/20 1322  WBC 4.8  NEUTROABS 3.7  HGB 12.3  HCT 36.1  MCV 104.0*  PLT 270*   Basic Metabolic Panel: Recent Labs  Lab 08/12/20 1423  NA 140  K 3.3*  CL 98  CO2 27  GLUCOSE 102*  BUN 12  CREATININE 0.54  CALCIUM 7.9*   GFR: CrCl cannot be calculated (Unknown ideal weight.). Liver Function Tests: Recent Labs  Lab 08/12/20 1423  AST 41  ALT 29  ALKPHOS 122  BILITOT 1.1  PROT 5.6*  ALBUMIN 2.6*   Recent Labs  Lab 08/12/20 1423  LIPASE 23   No results for input(s): AMMONIA in the last 168 hours. Coagulation Profile: No results for input(s): INR, PROTIME in the last 168 hours. Cardiac Enzymes: No results for input(s): CKTOTAL, CKMB, CKMBINDEX, TROPONINI in the last 168 hours. BNP (last 3 results) No results for input(s): PROBNP in the last 8760 hours. HbA1C: No results for input(s): HGBA1C in the last 72 hours. CBG: No results for input(s): GLUCAP in the last 168 hours. Lipid Profile: No results for input(s): CHOL, HDL, LDLCALC, TRIG, CHOLHDL, LDLDIRECT in the last 72 hours. Thyroid Function Tests: No results for input(s): TSH, T4TOTAL, FREET4, T3FREE, THYROIDAB in the last 72  hours. Anemia Panel: No results for input(s): VITAMINB12, FOLATE, FERRITIN, TIBC, IRON, RETICCTPCT in the last 72 hours. Urine analysis:    Component Value Date/Time   COLORURINE YELLOW 08/12/2020 [place] 08/12/2020 1223   LABSPEC 1.020 08/12/2020 1223   PHURINE 7.0 08/12/2020 1223   GLUCOSEU NEGATIVE 08/12/2020 1223   HGBUR NEGATIVE 08/12/2020 1223   [place] 08/12/2020 1223   BILIRUBINUR negative 03/30/2020 1215   KETONESUR 80 (A) 08/12/2020 1223   PROTEINUR NEGATIVE 08/12/2020 1223   UROBILINOGEN 0.2 03/30/2020 1215   NITRITE NEGATIVE 08/12/2020 1223   LEUKOCYTESUR NEGATIVE 08/12/2020 1223   Sepsis Labs: !!!!!!!!!!!!!!!!!!!!!!!!!!!!!!!!!!!!!!!!!!!! @LABRCNTIP (procalcitonin:4,lacticidven:4) )No results found for this or any previous visit (from the past 240 hour(s)).   Radiological Exams on Admission: DG  Chest 2 View  Result Date: 08/12/2020 CLINICAL DATA:  Vomiting and diarrhea for the past 2 days. EXAM: CHEST - 2 VIEW COMPARISON:  None. FINDINGS: The heart size and mediastinal contours are within normal limits. Both lungs are clear. The visualized skeletal structures are unremarkable. IMPRESSION: No active cardiopulmonary disease. Electronically Signed   By: Titus Dubin M.D.   On: 08/12/2020 13:02    EKG: Independently reviewed. Sinus tachycardia   Assessment/Plan Principal Problem:   UGIB (upper gastrointestinal bleed) Active Problems:   Essential hypertension, benign   Depression   Hypokalemia   UGIB -FOBT positive -Hold aspirin, naproxen -GI consult -Trend CBC  -IV PPI  -IVF   HTN -Norvasc   Depression -Zoloft   Hypokalemia -Replace     DVT prophylaxis: SCD Code Status: Full  Family Communication: None at bedside Disposition Plan: Pending work up Consults called: GI    Severity of Illness: The appropriate patient status for this patient is OBSERVATION. Observation status is judged to be reasonable and  necessary in order to provide the required intensity of service to ensure the patient's safety. The patient's presenting symptoms, physical exam findings, and initial radiographic and laboratory data in the context of their medical condition is felt to place them at decreased risk for further clinical deterioration. Furthermore, it is anticipated that the patient will be medically stable for discharge from the hospital within 2 midnights of admission.   Dessa Phi, DO [facility] 08/12/2020, 5:44 PM   Available via Epic secure chat 7am-7pm After these hours, please refer to coverage provider listed on [email or web address]

## 2020-08-13 DIAGNOSIS — I10 Essential (primary) hypertension: Secondary | ICD-10-CM | POA: Diagnosis present

## 2020-08-13 DIAGNOSIS — D500 Iron deficiency anemia secondary to blood loss (chronic): Secondary | ICD-10-CM | POA: Diagnosis present

## 2020-08-13 DIAGNOSIS — K760 Fatty (change of) liver, not elsewhere classified: Secondary | ICD-10-CM | POA: Diagnosis present

## 2020-08-13 DIAGNOSIS — K2980 Duodenitis without bleeding: Secondary | ICD-10-CM | POA: Diagnosis present

## 2020-08-13 DIAGNOSIS — K219 Gastro-esophageal reflux disease without esophagitis: Secondary | ICD-10-CM | POA: Diagnosis present

## 2020-08-13 DIAGNOSIS — Z89421 Acquired absence of other right toe(s): Secondary | ICD-10-CM | POA: Diagnosis not present

## 2020-08-13 DIAGNOSIS — Z7982 Long term (current) use of aspirin: Secondary | ICD-10-CM | POA: Diagnosis not present

## 2020-08-13 DIAGNOSIS — K922 Gastrointestinal hemorrhage, unspecified: Secondary | ICD-10-CM | POA: Diagnosis present

## 2020-08-13 DIAGNOSIS — Z20822 Contact with and (suspected) exposure to covid-19: Secondary | ICD-10-CM | POA: Diagnosis present

## 2020-08-13 DIAGNOSIS — Z9851 Tubal ligation status: Secondary | ICD-10-CM | POA: Diagnosis not present

## 2020-08-13 DIAGNOSIS — I739 Peripheral vascular disease, unspecified: Secondary | ICD-10-CM | POA: Diagnosis present

## 2020-08-13 DIAGNOSIS — E876 Hypokalemia: Secondary | ICD-10-CM | POA: Diagnosis present

## 2020-08-13 DIAGNOSIS — K259 Gastric ulcer, unspecified as acute or chronic, without hemorrhage or perforation: Secondary | ICD-10-CM | POA: Diagnosis present

## 2020-08-13 DIAGNOSIS — F32A Depression, unspecified: Secondary | ICD-10-CM | POA: Diagnosis present

## 2020-08-13 DIAGNOSIS — K254 Chronic or unspecified gastric ulcer with hemorrhage: Secondary | ICD-10-CM | POA: Diagnosis present

## 2020-08-13 DIAGNOSIS — Z79899 Other long term (current) drug therapy: Secondary | ICD-10-CM | POA: Diagnosis not present

## 2020-08-13 DIAGNOSIS — Z8249 Family history of ischemic heart disease and other diseases of the circulatory system: Secondary | ICD-10-CM | POA: Diagnosis not present

## 2020-08-13 LAB — BASIC METABOLIC PANEL
Anion gap: 10 (ref 5–15)
BUN: 17 mg/dL (ref 8–23)
CO2: 27 mmol/L (ref 22–32)
Calcium: 7.5 mg/dL — ABNORMAL LOW (ref 8.9–10.3)
Chloride: 103 mmol/L (ref 98–111)
Creatinine, Ser: 0.64 mg/dL (ref 0.44–1.00)
GFR, Estimated: >60 mL/min (ref >60)
Glucose, Bld: 79 mg/dL (ref 70–99)
Potassium: 3.4 mmol/L — ABNORMAL LOW (ref 3.5–5.1)
Sodium: 140 mmol/L (ref 135–145)

## 2020-08-13 LAB — TYPE AND SCREEN
ABO/RH(D): O POS
Antibody Screen: NEGATIVE

## 2020-08-13 LAB — HEMOGLOBIN AND HEMATOCRIT, BLOOD
HCT: 27.8 % — ABNORMAL LOW (ref 36.0–46.0)
Hemoglobin: 9.2 g/dL — ABNORMAL LOW (ref 12.0–15.0)

## 2020-08-13 LAB — HIV ANTIBODY (ROUTINE TESTING W REFLEX): HIV Screen 4th Generation wRfx: NONREACTIVE

## 2020-08-13 LAB — ABO/RH: ABO/RH(D): O POS

## 2020-08-13 MED ORDER — POTASSIUM CHLORIDE IN NACL 40-0.9 MEQ/L-% IV SOLN
Freq: Once | INTRAVENOUS | Status: AC
  Filled 2020-08-13: qty 1000

## 2020-08-13 NOTE — Consult Note (Signed)
[facility] Consultation Note  Referring Provider: [facility] Primary Care Physician:  Glendale Chard, MD  Reason for Consultation:  Hematemesis, melena  HPI: Debbie Barry is a 70 y.o. female admitted for above reasons.  She had a fall recently, with musculoskeletal pain, and has been on aspirin and NSAIDs.  Shortly thereafter, she began having melenic stools and then yesterday she had several bouts of nausea and vomiting with some hematemesis.  Last hematemesis yesterday.  No prior GI bleeding.  Screening colonoscopy with Dr. Benson Norway in 2015.  She has some periumbilical discomfort, worse after eating.  No known prior endoscopy.   Past Medical History:  Diagnosis Date  . Fatty liver   . GERD (gastroesophageal reflux disease)   . Hypertension   . Osteomyelitis ([place])    right third toe  . Peripheral vascular disease [facility])     Past Surgical History:  Procedure Laterality Date  . AMPUTATION Right 08/08/2017   Procedure: AMPUTATION RIGHT 3RD TOE;  Surgeon: Newt Minion, MD;  Location: [place];  Service: Orthopedics;  Laterality: Right;  . Bunionectomy Right 2006     Right  . Bunionectony Left 2006      August  . COLONOSCOPY N/A 01/21/2014   Procedure: COLONOSCOPY;  Surgeon: Beryle Beams, MD;  [facility];  Service: Endoscopy;  Laterality: N/A;  . TUBAL LIGATION    . WISDOM TOOTH EXTRACTION      Prior to Admission medications   Medication Sig Start Date End Date Taking? Authorizing Provider  acetaminophen (TYLENOL) 650 MG CR tablet Take 650 mg by mouth every 8 (eight) hours as needed for pain.   Yes [provider]  amLODipine (NORVASC) 5 MG tablet TAKE 1 TABLET(5 MG) BY MOUTH DAILY 03/07/20  Yes Glendale Chard, MD  aspirin EC 325 MG tablet Take 325 mg by mouth daily.   Yes [provider]  Cholecalciferol (VITAMIN D3) 5000 units CAPS Take 5,000 Units by mouth daily.   Yes [provider]  Magnesium 400 MG CAPS Take 1 capsule  by mouth daily at bedtime 12/23/19  Yes Glendale Chard, MD  Multiple Vitamin (MULTIVITAMIN WITH MINERALS) TABS tablet Take 1 tablet by mouth daily.   Yes [provider]  naproxen sodium (ALEVE) 220 MG tablet Take 220 mg by mouth daily as needed (pain).   Yes [provider]  potassium chloride SA (KLOR-CON) 20 MEQ tablet Take 1 tablet (20 mEq total) by mouth daily. 12/23/19  Yes Glendale Chard, MD  sertraline (ZOLOFT) 25 MG tablet Take 1 tablet (25 mg total) by mouth daily. 12/22/19  Yes Glendale Chard, MD  carvedilol (COREG) 12.5 MG tablet Take 1 tablet (12.5 mg total) by mouth 2 (two) times daily with a meal. Patient not taking: Reported on 08/12/2020 01/26/19   Glendale Chard, MD  Tetrahydrozoline HCl (VISINE OP) Place 2 drops into both eyes daily as needed (for dry/irritated eyes).     [provider]    Current Facility-Administered Medications  Medication Dose Route Frequency Provider Last Rate Last Admin  . acetaminophen (TYLENOL) tablet 650 mg  650 mg Oral Q6H PRN Dessa Phi, DO   650 mg at 08/12/20 2312   Or  . acetaminophen (TYLENOL) suppository 650 mg  650 mg Rectal Q6H PRN Dessa Phi, DO      . amLODipine (NORVASC) tablet 5 mg  5 mg Oral Daily Dessa Phi, DO   5 mg at 08/13/20 8546  . metoprolol tartrate (LOPRESSOR) injection 5 mg  5 mg Intravenous Q6H PRN Dessa Phi, DO      . ondansetron [facility]) tablet 4 mg  4 mg Oral Q6H PRN Dessa Phi, DO       Or  . ondansetron [facility]) injection 4 mg  4 mg Intravenous Q6H PRN Dessa Phi, DO      . pantoprazole (PROTONIX) 80 mg in sodium chloride 0.9 % 100 mL (0.8 mg/mL) infusion  8 mg/hr Intravenous Continuous Dessa Phi, DO 10 mL/hr at 08/13/20 0642 8 mg/hr at 08/13/20 0642  . [START ON 08/16/2020] pantoprazole (PROTONIX) injection 40 mg  40 mg Intravenous Q12H Dessa Phi, DO      . sertraline (ZOLOFT) tablet 25 mg  25 mg Oral Daily Dessa Phi, DO   25 mg at 08/13/20 4132     Allergies as of 08/12/2020  . (No Known Allergies)    Family History  Problem Relation Age of Onset  . Breast cancer Mother   . Colon cancer Mother   . Heart Problems Mother   . Hypertension Mother   . Multiple myeloma Mother   . Hypertension Father   . Heart disease Father   . Multiple myeloma Father     Social History   Socioeconomic History  . Marital status: Married    Spouse name: Not on file  . Number of children: Not on file  . Years of education: Not on file  . Highest education level: Not on file  Occupational History  . Not on file  Tobacco Use  . Smoking status: Never Smoker  . Smokeless tobacco: Never Used  Vaping Use  . Vaping Use: Never used  Substance and Sexual Activity  . Alcohol use: Not Currently    Comment: social glass of wine  . Drug use: No  . Sexual activity: Not on file  Other Topics Concern  . Not on file  Social History Narrative  . Not on file   Social Determinants of Health   Financial Resource Strain: Low Risk   . Difficulty of Paying Living Expenses: Not hard at all  Food Insecurity: No Food Insecurity  . Worried About Charity fundraiser in the Last Year: Never true  . Ran Out of Food in the Last Year: Never true  Transportation Needs: No Transportation Needs  . Lack of Transportation (Medical): No  . Lack of Transportation (Non-Medical): No  Physical Activity: Inactive  . Days of Exercise per Week: 0 days  . Minutes of Exercise per Session: 0 min  Stress: No Stress Concern Present  . Feeling of Stress : Not at all  Social Connections: Not on file  Intimate Partner Violence: Not on file    Review of Systems: As per HPI, all others negative  Physical Exam: Vital signs in last 24 hours: Temp:  [98.4 F (36.9 C)-98.9 F (37.2 C)] 98.4 F (36.9 C) (02/13 1011) Pulse Rate:  [90-115] 95 (02/13 1011) Resp:  [12-48] 18 (02/13 1011) BP: (127-172)/(76-102) 151/81 (02/13 1011) SpO2:  [93 %-100 %] 100 % (02/13  1011) Weight:  [44 kg] 64 kg (02/12 2240) Last BM Date: 08/13/20 General:   Alert,  Well-developed, well-nourished, pleasant and cooperative in NAD Head:  Normocephalic and atraumatic. Eyes:  Sclera clear, no icterus.   Conjunctiva pink. Ears:  Normal auditory acuity. Nose:  No deformity, discharge,  or lesions. Mouth:  No deformity or lesions.  Oropharynx pink & moist. Neck:  Supple; no masses or thyromegaly. Abdomen:  Soft, protuberant, mild periumbilical tenderness without peritonitis,  No masses, hepatosplenomegaly or hernias noted. Normal bowel sounds, without guarding, and without rebound.     Msk:  Symmetrical without gross deformities. Normal posture. Pulses:  Normal pulses noted. Extremities:  Without clubbing or edema. Neurologic:  Alert and  oriented x4;  grossly normal neurologically. Skin:  Intact without significant lesions or rashes. Psych:  Alert and cooperative. Normal mood and affect.   Lab Results: Recent Labs    08/12/20 1322 08/13/20 1114  WBC 4.8  --   HGB 12.3 9.2*  HCT 36.1 27.8*  PLT 105*  --    BMET Recent Labs    08/12/20 1423 08/13/20 0738  NA 140 140  K 3.3* 3.4*  CL 98 103  CO2 27 27  GLUCOSE 102* 79  BUN 12 17  CREATININE 0.54 0.64  CALCIUM 7.9* 7.5*   LFT Recent Labs    08/12/20 1423  PROT 5.6*  ALBUMIN 2.6*  AST 41  ALT 29  ALKPHOS 122  BILITOT 1.1   PT/INR No results for input(s): LABPROT, INR in the last 72 hours.  Studies/Results: DG Chest 2 View  Result Date: 08/12/2020 CLINICAL DATA:  Vomiting and diarrhea for the past 2 days. EXAM: CHEST - 2 VIEW COMPARISON:  None. FINDINGS: The heart size and mediastinal contours are within normal limits. Both lungs are clear. The visualized skeletal structures are unremarkable. IMPRESSION: No active cardiopulmonary disease. Electronically Signed   By: Titus Dubin M.D.   On: 08/12/2020 13:02    Impression:  1.  Hematemesis, resolved. 2.  Melena, intermittent. 3.  Recent fall  with NSAID use.  Plan:  1.  Clear liquids, npo after midnight. 2.  PPI. 3.  Endoscopy tomorrow. 4.  Risks (bleeding, infection, bowel perforation that could require surgery, sedation-related changes in cardiopulmonary systems), benefits (identification and possible treatment of source of symptoms, exclusion of certain causes of symptoms), and alternatives (watchful waiting, radiographic imaging studies, empiric medical treatment) of upper endoscopy (EGD) were explained to patient/family in detail and patient wishes to proceed. 5.  Next step in management pending endoscopy findings.   LOS: 0 days   Ladislav Caselli M  08/13/2020, 12:28 PM  Cell [phone] If no answer or after 5 PM call [phone]

## 2020-08-13 NOTE — Progress Notes (Signed)
PROGRESS NOTE    Debbie Barry  LAG:[id number] DOB: 03/18/1951 DOA: 08/12/2020 PCP: Glendale Chard, MD     Brief Narrative:  Debbie Barry is a 70 y.o. female with medical history significant of HTN, GERD who presents with generalized abdominal pain, hematemesis over the past couple of days as well as melena over the past couple of months. She thought her magnesium supplementation was making her stools dark. She takes aspirin 325mg  daily and aleve PRN (last dose 2/11). She states that she fell a few days ago and since then has been taking 1 aleve per day. She admits to lightheadedness and dizziness, denies CP, SOB, cough. Her last colonoscopy was in 2015 with Dr. Benson Norway, found polyp and diverticula.  New events last 24 hours / Subjective: States that she has not had any further vomiting since admission, but having bright red blood per rectum last night.  Admits to fatigue but no lightheadedness or dizziness.  CBC this morning remains pending, patient has been a difficult stick.  Assessment & Plan:   Principal Problem:   UGIB (upper gastrointestinal bleed) Active Problems:   Essential hypertension, benign   Depression   Hypokalemia   UGIB, hematemesis as well as melena -FOBT positive -Hold aspirin, naproxen -GI consulted -Trend CBC.  Type and screen ordered.  -IV PPI  -IVF   HTN -Norvasc   Depression -Zoloft   Hypokalemia -Replace   DVT prophylaxis:  SCDs Start: 08/12/20 2237  Code Status: Full code Family Communication: No family at bedside Disposition Plan:  Status is: Observation  The patient will require care spanning > 2 midnights and should be moved to inpatient because: IV treatments appropriate due to intensity of illness or inability to take PO  Dispo: The patient is from: Home              Anticipated d/c is to: Home              Anticipated d/c date is: 1 day              Patient currently is not medically stable to d/c.  Await GI  consultation   Difficult to place patient No      Consultants:   GI   Antimicrobials:  Anti-infectives (From admission, onward)   None        Objective: Vitals:   08/12/20 2226 08/12/20 2240 08/13/20 0139 08/13/20 0517  BP:  (!) 144/94 (!) 172/99 (!) 144/82  Pulse:  (!) 106 (!) 109 95  Resp:  18  14  Temp: 98.7 F (37.1 C) 98.9 F (37.2 C)  98.7 F (37.1 C)  TempSrc: Oral Oral  Oral  SpO2:  100% 100% 98%  Weight:  64 kg    Height:  5\' 1"  (1.549 m)      Intake/Output Summary (Last 24 hours) at 08/13/2020 0911 Last data filed at 08/13/2020 0523 Gross per 24 hour  Intake 615.82 ml  Output 0 ml  Net 615.82 ml   Filed Weights   08/12/20 2240  Weight: 64 kg    Examination:  General exam: Appears calm and comfortable  Respiratory system: Clear to auscultation. Respiratory effort normal. No respiratory distress. No conversational dyspnea.  Cardiovascular system: S1 & S2 heard, RRR. No murmurs. No pedal edema. Gastrointestinal system: Abdomen is nondistended, soft and nontender. Normal bowel sounds heard. Central nervous system: Alert and oriented. No focal neurological deficits. Speech clear.  Extremities: Symmetric in appearance  Skin: No rashes, lesions or ulcers on  exposed skin  Psychiatry: Judgement and insight appear normal. Mood & affect appropriate.   Data Reviewed: I have personally reviewed following labs and imaging studies  CBC: Recent Labs  Lab 08/12/20 1322  WBC 4.8  NEUTROABS 3.7  HGB 12.3  HCT 36.1  MCV 104.0*  PLT 932*   Basic Metabolic Panel: Recent Labs  Lab 08/12/20 1423 08/13/20 0738  NA 140 140  K 3.3* 3.4*  CL 98 103  CO2 27 27  GLUCOSE 102* 79  BUN 12 17  CREATININE 0.54 0.64  CALCIUM 7.9* 7.5*   GFR: Estimated Creatinine Clearance: 58.5 mL/min (by C-G formula based on SCr of 0.64 mg/dL). Liver Function Tests: Recent Labs  Lab 08/12/20 1423  AST 41  ALT 29  ALKPHOS 122  BILITOT 1.1  PROT 5.6*  ALBUMIN 2.6*    Recent Labs  Lab 08/12/20 1423  LIPASE 23   No results for input(s): AMMONIA in the last 168 hours. Coagulation Profile: No results for input(s): INR, PROTIME in the last 168 hours. Cardiac Enzymes: No results for input(s): CKTOTAL, CKMB, CKMBINDEX, TROPONINI in the last 168 hours. BNP (last 3 results) No results for input(s): PROBNP in the last 8760 hours. HbA1C: No results for input(s): HGBA1C in the last 72 hours. CBG: No results for input(s): GLUCAP in the last 168 hours. Lipid Profile: No results for input(s): CHOL, HDL, LDLCALC, TRIG, CHOLHDL, LDLDIRECT in the last 72 hours. Thyroid Function Tests: No results for input(s): TSH, T4TOTAL, FREET4, T3FREE, THYROIDAB in the last 72 hours. Anemia Panel: No results for input(s): VITAMINB12, FOLATE, FERRITIN, TIBC, IRON, RETICCTPCT in the last 72 hours. Sepsis Labs: No results for input(s): PROCALCITON, LATICACIDVEN in the last 168 hours.  Recent Results (from the past 240 hour(s))  Resp Panel by RT-PCR (Flu A&B, Covid) Nasopharyngeal Swab     Status: None   Collection Time: 08/12/20  8:02 PM   Specimen: Nasopharyngeal Swab; Nasopharyngeal(NP) swabs in vial transport medium  Result Value Ref Range Status   SARS Coronavirus 2 by RT PCR NEGATIVE NEGATIVE Final    Comment: (NOTE) SARS-CoV-2 target nucleic acids are NOT DETECTED.  The SARS-CoV-2 RNA is generally detectable in upper respiratory specimens during the acute phase of infection. The lowest concentration of SARS-CoV-2 viral copies this assay can detect is 138 copies/mL. A negative result does not preclude SARS-Cov-2 infection and should not be used as the sole basis for treatment or other patient management decisions. A negative result may occur with  improper specimen collection/handling, submission of specimen other than nasopharyngeal swab, presence of viral mutation(s) within the areas targeted by this assay, and inadequate number of viral copies(<138  copies/mL). A negative result must be combined with clinical observations, patient history, and epidemiological information. The expected result is Negative.  Fact Sheet for Patients:  EntrepreneurPulse.com.au  Fact Sheet for Healthcare Providers:  IncredibleEmployment.be  This test is no t yet approved or cleared by the [place] FDA and  has been authorized for detection and/or diagnosis of SARS-CoV-2 by FDA under an Emergency Use Authorization (EUA). This EUA will remain  in effect (meaning this test can be used) for the duration of the COVID-19 declaration under Section 564(b)(1) of the Act, 21 U.S.C.section 360bbb-3(b)(1), unless the authorization is terminated  or revoked sooner.       Influenza A by PCR NEGATIVE NEGATIVE Final   Influenza B by PCR NEGATIVE NEGATIVE Final    Comment: (NOTE) The Xpert Xpress SARS-CoV-2/FLU/RSV plus assay is intended as an aid  in the diagnosis of influenza from Nasopharyngeal swab specimens and should not be used as a sole basis for treatment. Nasal washings and aspirates are unacceptable for Xpert Xpress SARS-CoV-2/FLU/RSV testing.  Fact Sheet for Patients: EntrepreneurPulse.com.au  Fact Sheet for Healthcare Providers: IncredibleEmployment.be  This test is not yet approved or cleared by the [place] FDA and has been authorized for detection and/or diagnosis of SARS-CoV-2 by FDA under an Emergency Use Authorization (EUA). This EUA will remain in effect (meaning this test can be used) for the duration of the COVID-19 declaration under Section 564(b)(1) of the Act, 21 U.S.C. section 360bbb-3(b)(1), unless the authorization is terminated or revoked.  Performed at [facility], [place] [address] [place], [place] [address]       Radiology Studies: DG Chest 2 View  Result Date: 08/12/2020 CLINICAL DATA:  Vomiting and diarrhea for the past  2 days. EXAM: CHEST - 2 VIEW COMPARISON:  None. FINDINGS: The heart size and mediastinal contours are within normal limits. Both lungs are clear. The visualized skeletal structures are unremarkable. IMPRESSION: No active cardiopulmonary disease. Electronically Signed   By: Titus Dubin M.D.   On: 08/12/2020 13:02      Scheduled Meds: . amLODipine  5 mg Oral Daily  . [START ON 08/16/2020] pantoprazole  40 mg Intravenous Q12H  . sertraline  25 mg Oral Daily   Continuous Infusions: . sodium chloride 100 mL/hr at 08/13/20 0523  . pantoprozole (PROTONIX) infusion 8 mg/hr (08/13/20 0623)     LOS: 0 days      Time spent: 20 minutes   Dessa Phi, DO [facility] 08/13/2020, 9:11 AM   Available via Epic secure chat 7am-7pm After these hours, please refer to coverage provider listed on [email or web address]

## 2020-08-14 ENCOUNTER — Inpatient Hospital Stay (HOSPITAL_COMMUNITY): Payer: Medicare HMO | Admitting: Anesthesiology

## 2020-08-14 ENCOUNTER — Encounter (HOSPITAL_COMMUNITY): Payer: Self-pay | Admitting: Internal Medicine

## 2020-08-14 ENCOUNTER — Other Ambulatory Visit: Payer: Self-pay

## 2020-08-14 ENCOUNTER — Encounter (HOSPITAL_COMMUNITY)
Admission: EM | Disposition: A | Discharge status: Home / Self Care | Payer: Self-pay | Source: Home / Self Care | Attending: Internal Medicine

## 2020-08-14 DIAGNOSIS — K922 Gastrointestinal hemorrhage, unspecified: Secondary | ICD-10-CM | POA: Diagnosis not present

## 2020-08-14 HISTORY — PX: BIOPSY: SHX5522

## 2020-08-14 HISTORY — PX: ESOPHAGOGASTRODUODENOSCOPY (EGD) WITH PROPOFOL: SHX5813

## 2020-08-14 LAB — URINE CULTURE

## 2020-08-14 LAB — BASIC METABOLIC PANEL
Anion gap: 11 (ref 5–15)
BUN: 13 mg/dL (ref 8–23)
CO2: 26 mmol/L (ref 22–32)
Calcium: 8.1 mg/dL — ABNORMAL LOW (ref 8.9–10.3)
Chloride: 104 mmol/L (ref 98–111)
Creatinine, Ser: 0.63 mg/dL (ref 0.44–1.00)
GFR, Estimated: >60 mL/min (ref >60)
Glucose, Bld: 76 mg/dL (ref 70–99)
Potassium: 3.1 mmol/L — ABNORMAL LOW (ref 3.5–5.1)
Sodium: 141 mmol/L (ref 135–145)

## 2020-08-14 LAB — CBC
HCT: 31.5 % — ABNORMAL LOW (ref 36.0–46.0)
Hemoglobin: 10.4 g/dL — ABNORMAL LOW (ref 12.0–15.0)
MCH: 34.8 pg — ABNORMAL HIGH (ref 26.0–34.0)
MCHC: 33 g/dL (ref 30.0–36.0)
MCV: 105.4 fL — ABNORMAL HIGH (ref 80.0–100.0)
Platelets: 157 10*3/uL (ref 150–400)
RBC: 2.99 MIL/uL — ABNORMAL LOW (ref 3.87–5.11)
RDW: 15.5 % (ref 11.5–15.5)
WBC: 5.2 10*3/uL (ref 4.0–10.5)
nRBC: 0 % (ref 0.0–0.2)

## 2020-08-14 LAB — MAGNESIUM: Magnesium: 1.5 mg/dL — ABNORMAL LOW (ref 1.7–2.4)

## 2020-08-14 SURGERY — ESOPHAGOGASTRODUODENOSCOPY (EGD) WITH PROPOFOL
Anesthesia: Monitor Anesthesia Care

## 2020-08-14 MED ORDER — MAGNESIUM SULFATE 2 GM/50ML IV SOLN
2.0000 g | Freq: Once | INTRAVENOUS | Status: AC
  Administered 2020-08-14: 2 g via INTRAVENOUS
  Filled 2020-08-14: qty 50

## 2020-08-14 MED ORDER — BOOST / RESOURCE BREEZE PO LIQD CUSTOM
1.0000 | Freq: Three times a day (TID) | ORAL | Status: DC
  Administered 2020-08-14 – 2020-08-15 (×2): 1 via ORAL

## 2020-08-14 MED ORDER — PROPOFOL 10 MG/ML IV BOLUS
INTRAVENOUS | Status: DC | PRN
  Administered 2020-08-14: 20 mg via INTRAVENOUS
  Administered 2020-08-14: 40 mg via INTRAVENOUS
  Administered 2020-08-14: 20 mg via INTRAVENOUS

## 2020-08-14 MED ORDER — ESMOLOL HCL 100 MG/10ML IV SOLN
INTRAVENOUS | Status: DC | PRN
  Administered 2020-08-14: 50 mg via INTRAVENOUS

## 2020-08-14 MED ORDER — ADULT MULTIVITAMIN W/MINERALS CH
1.0000 | ORAL_TABLET | Freq: Every day | ORAL | Status: DC
  Administered 2020-08-14 – 2020-08-15 (×2): 1 via ORAL
  Filled 2020-08-14 (×2): qty 1

## 2020-08-14 MED ORDER — PROPOFOL 500 MG/50ML IV EMUL
INTRAVENOUS | Status: DC | PRN
  Administered 2020-08-14: 150 ug/kg/min via INTRAVENOUS

## 2020-08-14 MED ORDER — SODIUM CHLORIDE 0.9 % IV SOLN: INTRAVENOUS | Status: DC

## 2020-08-14 MED ORDER — PHENYLEPHRINE 40 MCG/ML (10ML) SYRINGE FOR IV PUSH (FOR BLOOD PRESSURE SUPPORT)
PREFILLED_SYRINGE | INTRAVENOUS | Status: DC | PRN
  Administered 2020-08-14: 80 ug via INTRAVENOUS

## 2020-08-14 MED ORDER — POTASSIUM CHLORIDE IN NACL 40-0.9 MEQ/L-% IV SOLN
INTRAVENOUS | Status: DC
  Filled 2020-08-14 (×3): qty 1000

## 2020-08-14 MED ORDER — LIDOCAINE 2% (20 MG/ML) 5 ML SYRINGE
INTRAMUSCULAR | Status: DC | PRN
  Administered 2020-08-14: 60 mg via INTRAVENOUS

## 2020-08-14 SURGICAL SUPPLY — 14 items

## 2020-08-14 NOTE — Transfer of Care (Signed)
Immediate Anesthesia Transfer of Care Note  Patient: Debbie Barry  Procedure(s) Performed: ESOPHAGOGASTRODUODENOSCOPY (EGD) WITH PROPOFOL (N/A )  Patient Location: Endoscopy Unit  Anesthesia Type:MAC  Level of Consciousness: awake, alert , oriented and patient cooperative  Airway & Oxygen Therapy: Patient Spontanous Breathing and Patient connected to face mask oxygen  Post-op Assessment: Report given to RN, Post -op Vital signs reviewed and stable and Patient moving all extremities  Post vital signs: Reviewed and stable  Last Vitals:  Vitals Value Taken Time  BP    Temp    Pulse    Resp    SpO2      Last Pain:  Vitals:   08/14/20 1127  TempSrc: Oral  PainSc: 1       Patients Stated Pain Goal: 2 (38/25/05 3976)  Complications: No complications documented.

## 2020-08-14 NOTE — Progress Notes (Signed)
Initial Nutrition Assessment  INTERVENTION:   When diet advanced: -Boost Breeze po TID, each supplement provides 250 kcal and 9 grams of protein  -Multivitamin with minerals daily  NUTRITION DIAGNOSIS:   Inadequate oral intake related to nausea,vomiting as evidenced by per patient/family report.  GOAL:   Patient will meet greater than or equal to 90% of their needs  MONITOR:   Diet advancement,Labs,Weight trends,I & O's  REASON FOR ASSESSMENT:   Malnutrition Screening Tool    ASSESSMENT:   70 y.o. female with medical history significant of HTN, GERD who presents with generalized abdominal pain, hematemesis over the past couple of days as well as melena over the past couple of months.  Per chart review, pt has had abdominal pain and N/V for 2 days PTA.  Consumed 0-50% of clears yesterday.  NPO today for EGD. Currently out of room. Will order Boost Breeze once diet is advanced. Recommend daily MVI as well.  Per weight records, pt has lost 18 lbs since 03/30/20 (11% wt loss x 4.5 months, significant for time frame). Per nursing documentation, pt with mild-moderate bilateral extremity edema.  Medications: IV Mg sulfate  Labs reviewed: Low K, Mg   NUTRITION - FOCUSED PHYSICAL EXAM:  Unable to complete, EGD  Diet Order:   Diet Order            Diet NPO time specified  Diet effective now                 EDUCATION NEEDS:   No education needs have been identified at this time  Skin:  Skin Assessment: Reviewed RN Assessment  Last BM:  2/13 -type 7  Height:   Ht Readings from Last 1 Encounters:  08/14/20 5\' 1"  (1.549 m)    Weight:   Wt Readings from Last 1 Encounters:  08/14/20 64 kg   BMI:  Body mass index is 26.64 kg/m.  Estimated Nutritional Needs:   Kcal:  1600-1800  Protein:  75-90g  Fluid:  1.6L/day  Clayton Bibles, MS, RD, LDN Inpatient Clinical Dietitian Contact information available via Amion

## 2020-08-14 NOTE — Anesthesia Preprocedure Evaluation (Addendum)
Anesthesia Evaluation  Patient identified by MRN, date of birth, ID band Patient awake    Reviewed: Allergy & Precautions, NPO status , Patient's Chart, lab work & pertinent test results  History of Anesthesia Complications Negative for: history of anesthetic complications  Airway Mallampati: I  TM Distance: >3 FB Neck ROM: Full    Dental  (+) Edentulous Upper, Edentulous Lower, Dental Advisory Given   Pulmonary neg pulmonary ROS,    Pulmonary exam normal        Cardiovascular hypertension, Pt. on medications and Pt. on home beta blockers + Peripheral Vascular Disease  Normal cardiovascular exam     Neuro/Psych PSYCHIATRIC DISORDERS Depression negative neurological ROS     GI/Hepatic Neg liver ROS, GERD  ,  Endo/Other  negative endocrine ROS  Renal/GU negative Renal ROS     Musculoskeletal negative musculoskeletal ROS (+)   Abdominal   Peds  Hematology negative hematology ROS (+) anemia ,   Anesthesia Other Findings   Reproductive/Obstetrics                            Anesthesia Physical Anesthesia Plan  ASA: II  Anesthesia Plan: MAC   Post-op Pain Management:    Induction: Intravenous  PONV Risk Score and Plan: 2 and Ondansetron and Propofol infusion  Airway Management Planned: Natural Airway  Additional Equipment:   Intra-op Plan:   Post-operative Plan:   Informed Consent: I have reviewed the patients History and Physical, chart, labs and discussed the procedure including the risks, benefits and alternatives for the proposed anesthesia with the patient or authorized representative who has indicated his/her understanding and acceptance.     Dental advisory given  Plan Discussed with: Anesthesiologist and CRNA  Anesthesia Plan Comments:        Anesthesia Quick Evaluation

## 2020-08-14 NOTE — Anesthesia Postprocedure Evaluation (Signed)
Anesthesia Post Note  Patient: Nicolas Sisler  Procedure(s) Performed: ESOPHAGOGASTRODUODENOSCOPY (EGD) WITH PROPOFOL (N/A ) BIOPSY     Patient location during evaluation: PACU Anesthesia Type: MAC Level of consciousness: awake and alert Pain management: pain level controlled Vital Signs Assessment: post-procedure vital signs reviewed and stable Respiratory status: spontaneous breathing and respiratory function stable Cardiovascular status: stable Postop Assessment: no apparent nausea or vomiting Anesthetic complications: no   No complications documented.  Last Vitals:  Vitals:   08/14/20 1250 08/14/20 1314  BP: (!) 106/51 134/73  Pulse: 86 96  Resp: 14   Temp:  36.8 C  SpO2: 95% 98%    Last Pain:  Vitals:   08/14/20 1314  TempSrc: Oral  PainSc:                  Felica Chargois DANIEL

## 2020-08-14 NOTE — Progress Notes (Signed)
Danise Mina Baiz 11:40 AM  Subjective: Patient seen and examined and discussed with my partner Dr. Paulita Fujita in her hospital computer chart reviewed she has not had any bleeding today and her last bowel movement was Saturday night and we discussed her previous colonoscopy and compared it to the endoscopy she is about to have answered all of her questions  Objective: Vital signs stable afebrile exam please see preassessment evaluation hemoglobin stable BUN okay Assessment: Upper GI bleeding and patient on aspirin and nonsteroidals  Plan: Okay to proceed with endoscopy with anesthesia assistance  [facility]  office [phone] After 5PM or if no answer call [phone]

## 2020-08-14 NOTE — Op Note (Signed)
[facility] Patient Name: Debbie Barry Procedure Date: 08/14/2020 MRN: [id number] Attending MD: Clarene Essex , MD Date of Birth: 29-Jun-1951 CSN: [id number] Age: 70 Admit Type: Inpatient Procedure:                Upper GI endoscopy Indications:              Hematemesis, Melena Providers:                Clarene Essex, MD, Cleda Daub, RN, Lesia Sago, Technician, Caryl Pina CRNA Referring MD:              Medicines:                Propofol total dose 160 mg IV, lidocaine 60 mg Complications:            No immediate complications. Estimated Blood Loss:     Estimated blood loss: none. Procedure:                Pre-Anesthesia Assessment:                           - Prior to the procedure, a History and Physical                            was performed, and patient medications and                            allergies were reviewed. The patient's tolerance of                            previous anesthesia was also reviewed. The risks                            and benefits of the procedure and the sedation                            options and risks were discussed with the patient.                            All questions were answered, and informed consent                            was obtained. Prior Anticoagulants: The patient has                            taken no previous anticoagulant or antiplatelet                            agents except for aspirin. ASA Grade Assessment: II                            - A patient with mild systemic disease. After  reviewing the risks and benefits, the patient was                            deemed in satisfactory condition to undergo the                            procedure.                           After obtaining informed consent, the endoscope was                            passed under direct vision. Throughout the                            procedure, the patient's  blood pressure, pulse, and                            oxygen saturations were monitored continuously. The                            GIF-H190 ([id number] Olympus gastroscope was                            introduced through the mouth, and advanced to the                            third part of duodenum. The upper GI endoscopy was                            accomplished without difficulty. The patient                            tolerated the procedure well. Scope In: Scope Out: Findings:      A tiny hiatal hernia was present.      One non-bleeding cratered gastric ulcer with no stigmata of bleeding was       found in the prepyloric region of the stomach.      Diffuse mild inflammation characterized by congestion (edema) and       erythema was found in the gastric body and in the gastric antrum.       Biopsies were taken with a cold forceps for histology of the antrum and       the fundus to rule out H. pylori.      Patchy mild inflammation characterized by erythema was found in the       duodenal bulb.      The second portion of the duodenum and third portion of the duodenum       were normal.      The exam was otherwise without abnormality. Impression:               - Tiny hiatal hernia.                           - Non-bleeding gastric ulcer with no stigmata of  bleeding.                           - Chronic gastritis. Biopsied.                           - Caustic duodenitis.                           - Normal second portion of the duodenum and third                            portion of the duodenum.                           - The examination was otherwise normal. Moderate Sedation:      Not Applicable - Patient had care per Anesthesia. Recommendation:           - Soft diet today.                           - Continue present medications. Hopefully home soon                            if no signs of rebleeding                           - Use Protonix  (pantoprazole) 40 mg PO BID for 2                            weeks and if doing well can decrease it to once a                            day for 2 months.                           - No aspirin, ibuprofen, naproxen, or other                            non-steroidal anti-inflammatory drugs Long-term                            unless a baby aspirin is absolutely needed in which                            case I would continue once a day pump inhibitors                            long-term probably could even wean it further to 20                            mg a day.                           - Await pathology results.                           -  Return to GI clinic in 4 weeks.                           - Telephone GI clinic for pathology results in 1                            week.                           - Telephone GI clinic if symptomatic PRN. Repeat                            colonoscopy in 2025 unless needed sooner for other                            reasons Procedure Code(s):        --- Professional ---                           [phone], Esophagogastroduodenoscopy, flexible,                            transoral; with biopsy, single or multiple Diagnosis Code(s):        --- Professional ---                           K44.9, Diaphragmatic hernia without obstruction or                            gangrene                           K25.9, Gastric ulcer, unspecified as acute or                            chronic, without hemorrhage or perforation                           K29.50, Unspecified chronic gastritis without                            bleeding                           K29.80, Duodenitis without bleeding                           K92.0, Hematemesis                           K92.1, Melena (includes Hematochezia) CPT copyright 2019 American [employer]. All rights reserved. The codes documented in this report are preliminary and upon coder review may  be revised to meet  current compliance requirements. Clarene Essex, MD 08/14/2020 12:36:06 PM This report has been signed electronically. Number of Addenda: 0

## 2020-08-14 NOTE — Progress Notes (Signed)
PROGRESS NOTE    Debbie Barry  HDQ:[id number] DOB: 1951-06-04 DOA: 08/12/2020 PCP: Glendale Chard, MD     Brief Narrative:  Debbie Barry is a 70 y.o. female with medical history significant of HTN, GERD who presents with generalized abdominal pain, hematemesis over the past couple of days as well as melena over the past couple of months. She thought her magnesium supplementation was making her stools dark. She takes aspirin 325mg  daily and aleve PRN (last dose 2/11). She states that she fell a few days ago and since then has been taking 1 aleve per day. She admits to lightheadedness and dizziness, denies CP, SOB, cough. Her last colonoscopy was in 2015 with Dr. Benson Norway, found polyp and diverticula.  New events last 24 hours / Subjective: Has not had any vomiting since admission.  States that blood in her stool has stopped last night.  No new complaints today.  Awaiting EGD  Assessment & Plan:   Principal Problem:   UGIB (upper gastrointestinal bleed) Active Problems:   Essential hypertension, benign   Depression   Hypokalemia   GI bleed   UGIB, hematemesis as well as melena -FOBT positive -Hold aspirin, naproxen -GI consulted, planning for EGD today 2/14 -Trend CBC.  Type and screen ordered.  -IV PPI  -Hemoglobin stable this morning   HTN -Norvasc   Depression -Zoloft   Hypokalemia -Replace  Hypomagnesemia -Replace, trend   DVT prophylaxis:  SCDs Start: 08/12/20 2237  Code Status: Full code Family Communication: No family at bedside Disposition Plan:  Status is: Inpatient  Remains inpatient appropriate because:Ongoing diagnostic testing needed not appropriate for outpatient work up   Dispo: The patient is from: Home              Anticipated d/c is to: Home              Anticipated d/c date is: 1 day              Patient currently is not medically stable to d/c.  EGD today.   Difficult to place patient No   Consultants:    GI   Antimicrobials:  Anti-infectives (From admission, onward)   None       Objective: Vitals:   08/13/20 1011 08/13/20 1347 08/14/20 0506 08/14/20 0805  BP: (!) 151/81 136/71 114/75 123/81  Pulse: 95 (!) 101 82 93  Resp: 18 16 18 18   Temp: 98.4 F (36.9 C) 98.1 F (36.7 C) (!) 97.5 F (36.4 C) 98.1 F (36.7 C)  TempSrc: Oral Oral Oral Oral  SpO2: 100% 100% 100% 100%  Weight:      Height:        Intake/Output Summary (Last 24 hours) at 08/14/2020 1020 Last data filed at 08/14/2020 1010 Gross per 24 hour  Intake 656.77 ml  Output 100 ml  Net 556.77 ml   Filed Weights   08/12/20 2240  Weight: 64 kg    Examination: General exam: Appears calm and comfortable  Respiratory system: Clear to auscultation. Respiratory effort normal. Cardiovascular system: S1 & S2 heard, RRR. No pedal edema. Gastrointestinal system: Abdomen is nondistended, soft and nontender. Normal bowel sounds heard. Central nervous system: Alert and oriented. Non focal exam. Speech clear  Extremities: Symmetric in appearance bilaterally  Skin: No rashes, lesions or ulcers on exposed skin  Psychiatry: Judgement and insight appear stable. Mood & affect appropriate.   Data Reviewed: I have personally reviewed following labs and imaging studies  CBC: Recent Labs  Lab  08/12/20 1322 08/13/20 1114 08/14/20 0528  WBC 4.8  --  5.2  NEUTROABS 3.7  --   --   HGB 12.3 9.2* 10.4*  HCT 36.1 27.8* 31.5*  MCV 104.0*  --  105.4*  PLT 105*  --  478   Basic Metabolic Panel: Recent Labs  Lab 08/12/20 1423 08/13/20 0738 08/14/20 0528  NA 140 140 141  K 3.3* 3.4* 3.1*  CL 98 103 104  CO2 27 27 26   GLUCOSE 102* 79 76  BUN 12 17 13   CREATININE 0.54 0.64 0.63  CALCIUM 7.9* 7.5* 8.1*  MG  --   --  1.5*   GFR: Estimated Creatinine Clearance: 58.5 mL/min (by C-G formula based on SCr of 0.63 mg/dL). Liver Function Tests: Recent Labs  Lab 08/12/20 1423  AST 41  ALT 29  ALKPHOS 122  BILITOT 1.1   PROT 5.6*  ALBUMIN 2.6*   Recent Labs  Lab 08/12/20 1423  LIPASE 23   No results for input(s): AMMONIA in the last 168 hours. Coagulation Profile: No results for input(s): INR, PROTIME in the last 168 hours. Cardiac Enzymes: No results for input(s): CKTOTAL, CKMB, CKMBINDEX, TROPONINI in the last 168 hours. BNP (last 3 results) No results for input(s): PROBNP in the last 8760 hours. HbA1C: No results for input(s): HGBA1C in the last 72 hours. CBG: No results for input(s): GLUCAP in the last 168 hours. Lipid Profile: No results for input(s): CHOL, HDL, LDLCALC, TRIG, CHOLHDL, LDLDIRECT in the last 72 hours. Thyroid Function Tests: No results for input(s): TSH, T4TOTAL, FREET4, T3FREE, THYROIDAB in the last 72 hours. Anemia Panel: No results for input(s): VITAMINB12, FOLATE, FERRITIN, TIBC, IRON, RETICCTPCT in the last 72 hours. Sepsis Labs: No results for input(s): PROCALCITON, LATICACIDVEN in the last 168 hours.  Recent Results (from the past 240 hour(s))  Urine culture     Status: Abnormal   Collection Time: 08/12/20 12:23 PM   Specimen: Urine, Clean Catch  Result Value Ref Range Status   Specimen Description   Final    URINE, CLEAN CATCH Performed at [facility], [place] [address] [place], [place] [address]    Special Requests   Final    NONE Performed at [facility], [place] [address] [place], [place] [address]    Culture MULTIPLE SPECIES PRESENT, SUGGEST RECOLLECTION (A)  Final   Report Status 08/14/2020 FINAL  Final  Resp Panel by RT-PCR (Flu A&B, Covid) Nasopharyngeal Swab     Status: None   Collection Time: 08/12/20  8:02 PM   Specimen: Nasopharyngeal Swab; Nasopharyngeal(NP) swabs in vial transport medium  Result Value Ref Range Status   SARS Coronavirus 2 by RT PCR NEGATIVE NEGATIVE Final    Comment: (NOTE) SARS-CoV-2 target nucleic acids are NOT DETECTED.  The SARS-CoV-2 RNA is generally detectable in upper  respiratory specimens during the acute phase of infection. The lowest concentration of SARS-CoV-2 viral copies this assay can detect is 138 copies/mL. A negative result does not preclude SARS-Cov-2 infection and should not be used as the sole basis for treatment or other patient management decisions. A negative result may occur with  improper specimen collection/handling, submission of specimen other than nasopharyngeal swab, presence of viral mutation(s) within the areas targeted by this assay, and inadequate number of viral copies(<138 copies/mL). A negative result must be combined with clinical observations, patient history, and epidemiological information. The expected result is Negative.  Fact Sheet for Patients:  EntrepreneurPulse.com.au  Fact Sheet for Healthcare Providers:  IncredibleEmployment.be  This test is no t yet approved or cleared by the [place] and  has been authorized for detection and/or diagnosis of SARS-CoV-2 by FDA under an Emergency Use Authorization (EUA). This EUA will remain  in effect (meaning this test can be used) for the duration of the COVID-19 declaration under Section 564(b)(1) of the Act, 21 U.S.C.section 360bbb-3(b)(1), unless the authorization is terminated  or revoked sooner.       Influenza A by PCR NEGATIVE NEGATIVE Final   Influenza B by PCR NEGATIVE NEGATIVE Final    Comment: (NOTE) The Xpert Xpress SARS-CoV-2/FLU/RSV plus assay is intended as an aid in the diagnosis of influenza from Nasopharyngeal swab specimens and should not be used as a sole basis for treatment. Nasal washings and aspirates are unacceptable for Xpert Xpress SARS-CoV-2/FLU/RSV testing.  Fact Sheet for Patients: EntrepreneurPulse.com.au  Fact Sheet for Healthcare Providers: IncredibleEmployment.be  This test is not yet approved or cleared by the [place] FDA and has been  authorized for detection and/or diagnosis of SARS-CoV-2 by FDA under an Emergency Use Authorization (EUA). This EUA will remain in effect (meaning this test can be used) for the duration of the COVID-19 declaration under Section 564(b)(1) of the Act, 21 U.S.C. section 360bbb-3(b)(1), unless the authorization is terminated or revoked.  Performed at [facility], [place] [address] [place], [place] [address]       Radiology Studies: DG Chest 2 View  Result Date: 08/12/2020 CLINICAL DATA:  Vomiting and diarrhea for the past 2 days. EXAM: CHEST - 2 VIEW COMPARISON:  None. FINDINGS: The heart size and mediastinal contours are within normal limits. Both lungs are clear. The visualized skeletal structures are unremarkable. IMPRESSION: No active cardiopulmonary disease. Electronically Signed   By: Titus Dubin M.D.   On: 08/12/2020 13:02      Scheduled Meds: . amLODipine  5 mg Oral Daily  . [START ON 08/16/2020] pantoprazole  40 mg Intravenous Q12H  . sertraline  25 mg Oral Daily   Continuous Infusions: . 0.9 % NaCl with KCl 40 mEq / L 75 mL/hr at 08/14/20 0929  . magnesium sulfate bolus IVPB 2 g (08/14/20 0933)  . pantoprozole (PROTONIX) infusion 8 mg/hr (08/14/20 0229)     LOS: 1 day      Time spent: 20 minutes   Dessa Phi, DO [facility] 08/14/2020, 10:20 AM   Available via Epic secure chat 7am-7pm After these hours, please refer to coverage provider listed on [email or web address]

## 2020-08-15 ENCOUNTER — Encounter (HOSPITAL_COMMUNITY): Payer: Self-pay | Admitting: Gastroenterology

## 2020-08-15 ENCOUNTER — Telehealth: Payer: Self-pay

## 2020-08-15 DIAGNOSIS — K922 Gastrointestinal hemorrhage, unspecified: Secondary | ICD-10-CM | POA: Diagnosis not present

## 2020-08-15 LAB — MAGNESIUM: Magnesium: 1.7 mg/dL (ref 1.7–2.4)

## 2020-08-15 LAB — CBC
HCT: 24.2 % — ABNORMAL LOW (ref 36.0–46.0)
Hemoglobin: 8.1 g/dL — ABNORMAL LOW (ref 12.0–15.0)
MCH: 35.1 pg — ABNORMAL HIGH (ref 26.0–34.0)
MCHC: 33.5 g/dL (ref 30.0–36.0)
MCV: 104.8 fL — ABNORMAL HIGH (ref 80.0–100.0)
Platelets: 127 10*3/uL — ABNORMAL LOW (ref 150–400)
RBC: 2.31 MIL/uL — ABNORMAL LOW (ref 3.87–5.11)
RDW: 15.5 % (ref 11.5–15.5)
WBC: 3.5 10*3/uL — ABNORMAL LOW (ref 4.0–10.5)
nRBC: 0 % (ref 0.0–0.2)

## 2020-08-15 LAB — BASIC METABOLIC PANEL
Anion gap: 7 (ref 5–15)
BUN: 8 mg/dL (ref 8–23)
CO2: 25 mmol/L (ref 22–32)
Calcium: 7.5 mg/dL — ABNORMAL LOW (ref 8.9–10.3)
Chloride: 108 mmol/L (ref 98–111)
Creatinine, Ser: 0.53 mg/dL (ref 0.44–1.00)
GFR, Estimated: >60 mL/min (ref >60)
Glucose, Bld: 78 mg/dL (ref 70–99)
Potassium: 3.1 mmol/L — ABNORMAL LOW (ref 3.5–5.1)
Sodium: 140 mmol/L (ref 135–145)

## 2020-08-15 LAB — SURGICAL PATHOLOGY

## 2020-08-15 MED ORDER — PANTOPRAZOLE SODIUM 40 MG PO TBEC: 40.0000 mg | DELAYED_RELEASE_TABLET | Freq: Two times a day (BID) | ORAL | 0 refills | Status: DC

## 2020-08-15 MED ORDER — PANTOPRAZOLE SODIUM 40 MG PO TBEC
40.0000 mg | DELAYED_RELEASE_TABLET | Freq: Two times a day (BID) | ORAL | Status: DC
  Administered 2020-08-15: 40 mg via ORAL
  Filled 2020-08-15: qty 1

## 2020-08-15 MED ORDER — POTASSIUM CHLORIDE CRYS ER 20 MEQ PO TBCR
40.0000 meq | EXTENDED_RELEASE_TABLET | ORAL | Status: DC
  Administered 2020-08-15: 40 meq via ORAL
  Filled 2020-08-15: qty 2

## 2020-08-15 NOTE — Telephone Encounter (Signed)
Transition Care Management Follow-up Telephone Call  Date of discharge and from where: 08/15/2020, [facility]   How have you been since you were released from the hospital? Patient states that she is feeling much better now.   Any questions or concerns? No  Items Reviewed:  Did the pt receive and understand the discharge instructions provided? Yes   Medications obtained and verified? Yes   Other? No   Any new allergies since your discharge? No   Dietary orders reviewed? Yes  Do you have support at home? Yes   Home Care and Equipment/Supplies: Were home health services ordered? not applicable If so, what is the name of the agency? N/A  Has the agency set up a time to come to the patient's home? not applicable Were any new equipment or medical supplies ordered?  No What is the name of the medical supply agency? N/A Were you able to get the supplies/equipment? not applicable Do you have any questions related to the use of the equipment or supplies? No  Functional Questionnaire: (I = Independent and D = Dependent) ADLs: I  Bathing/Dressing- I  Meal Prep- I  Eating- I  Maintaining continence- I  Transferring/Ambulation- I  Managing Meds- I  Follow up appointments reviewed:   PCP Hospital f/u appt confirmed? Yes  Scheduled to see Dr. Baird Cancer on 08/22/2020 @ 10:45 am.  [facility] f/u appt confirmed? plans to follow up with gastroenterology   Are transportation arrangements needed? No   If their condition worsens, is the pt aware to call PCP or go to the Emergency Dept.? Yes  Was the patient provided with contact information for the PCP's office or ED? Yes  Was to pt encouraged to call back with questions or concerns? Yes

## 2020-08-15 NOTE — Progress Notes (Signed)
[facility] Gastroenterology Progress Note  Debbie Barry 70 y.o. [birth year]  CC:  Upper GI bleeding  Subjective: Patient reports feeling well today.  Denies nausea, vomiting, abdominal pain.  She has not had a bowel movement today and states last bowel movement was Sunday.  ROS : Review of Systems  Cardiovascular: Negative for chest pain and palpitations.  Gastrointestinal: Negative for abdominal pain, blood in stool, constipation, diarrhea, heartburn, melena, nausea and vomiting.   Objective: Vital signs in last 24 hours: Vitals:   08/14/20 2108 08/15/20 0622  BP: (!) 143/89 115/64  Pulse: 81 80  Resp:  18  Temp: 98.8 F (37.1 C) 97.9 F (36.6 C)  SpO2: 100% 98%    Physical Exam:  General:  Alert, oriented, cooperative, no distress  Head:  Normocephalic, without obvious abnormality, atraumatic  Eyes:  Anicteric sclera, EOMs intact  Lungs:   Clear to auscultation bilaterally, respirations unlabored  Heart:  Regular rate and rhythm, S1, S2 normal  Abdomen:   Soft, non-tender, non-distended, bowel sounds active all four quadrants,  no guarding or peritoneal signs  Pulses: 2+ and symmetric    Lab Results: Recent Labs    08/14/20 0528 08/15/20 0500  NA 141 140  K 3.1* 3.1*  CL 104 108  CO2 26 25  GLUCOSE 76 78  BUN 13 8  CREATININE 0.63 0.53  CALCIUM 8.1* 7.5*  MG 1.5* 1.7   Recent Labs    08/12/20 1423  AST 41  ALT 29  ALKPHOS 122  BILITOT 1.1  PROT 5.6*  ALBUMIN 2.6*   Recent Labs    08/12/20 1322 08/13/20 1114 08/14/20 0528 08/15/20 0500  WBC 4.8  --  5.2 3.5*  NEUTROABS 3.7  --   --   --   HGB 12.3   < > 10.4* 8.1*  HCT 36.1   < > 31.5* 24.2*  MCV 104.0*  --  105.4* 104.8*  PLT 105*  --  157 127*   < > = values in this interval not displayed.   No results for input(s): LABPROT, INR in the last 72 hours.   Assessment: Upper GI bleeding: anemia, melena, hematemesis -EGD yesterday 2/14 revealed one non-bleeding gastric ulcer, acute  gastritis (biopsy pending), caustic duodnitis, tiny HH  -Hgb 8.1 today, decreased from 10.4 yesterday, though no signs of ongoing GI blood loss (no nausea/vomiting, no BM yesterday or today) -BUN 8, decreased from 13 yesterday.  Cr 0.53  Plan: Despite downtrending hemoglobin, BUN is also down-trending, and there is no evidence of overt GI bleeding.  Furthermore, patient is hemodynamically stable (normal HR with mild hypertension), thus OK to discharge from a GI standpoint.  Patient has an appointment on 2/25 with another provider in our office building, thus I advised patient to present to our office lab for repeat CBC/BMP that day.  Plan for follow-up in office in 6 to 8 weeks.  Advised patient to contact us or seek care if she experiences persistent or worsening melena, return of nausea/hematemesis, dizziness, syncope, chest pain, or shortness of breath.  She verbalized understanding.  Avoid aspirin and NSAIDs.  Continue Protonix 40 mg twice daily for 2 to 4 weeks, with decrease to once daily dosing if hemoglobin remains stable.  [facility] will sign off.  Please contact us if we can be of any further assistance during this hospital stay.  Salley Slaughter PA-C 08/15/2020, 8:49 AM  Contact #  [phone]

## 2020-08-15 NOTE — Discharge Instructions (Signed)
https://www.ncbi.nlm.nih.gov/books/NBK537291/#:~:text=Interventional%20Radiology-,Deterrence%20and%20Patient%20Education,also%20known%20as%20the%20colon)."> https://www.ncbi.nlm.nih.gov/books/NBK470300/">  °Upper Gastrointestinal Bleeding ° °Upper gastrointestinal (GI) bleeding is bleeding from the esophagus, the stomach, or the first part of the small intestine (duodenum). The esophagus is the part of the body that moves food from the mouth to the stomach. Upper GI bleeding may cause a person to vomit blood or have bloody or black stools (feces). °Bleeding can range from mild to serious or even life-threatening. Continued or heavy bleeding may need emergency treatment at a hospital. °What are the causes? °This condition may be caused by: °· A sore in the lining of your stomach or duodenum (peptic ulcer). This is the most common cause of GI bleeding. °· Esophagitis, or inflammation of your esophagus. °· A tear in the esophagus. °· Cancer of the esophagus, stomach, or duodenum. °· An abnormal or weakened blood vessel in one of your upper GI structures. °· A disorder that makes it hard to form blood clots and causes easy bleeding (coagulopathy). °What increases the risk? °The following factors may make you more likely to develop this condition: °· Being older than age 60. °· Being female. °· Having certain medical conditions. These may include: °? Liver or kidney disease. °? Stomach infection caused by Helicobacter pylori bacteria. °· Having frequent or severe vomiting. °· Heavy use of alcohol. °· Regular use of aspirin or NSAIDs, such as ibuprofen. °· Taking blood thinners (anticoagulants) or anti-platelet medicines. °What are the signs or symptoms? °Symptoms of this condition include: °· Vomiting blood that may be bright red or the color of coffee grounds. °· Black or maroon-colored stools, or bloody stools. °· Racing heartbeat, weakness, or dizziness. °· Heartburn or pain in your abdomen. °· Trouble  swallowing. °· Weight loss. °· Skin or the white parts of your eyes turning yellow (jaundice). °How is this diagnosed? °This condition may be diagnosed based on: °· Your symptoms and medical history. °· A physical exam. During the exam, your health care provider will check for signs of blood loss, such as low blood pressure and a fast pulse. °· Tests or procedures. These may include: °? Blood tests to check for low blood cell count and other signs of blood loss, and to check clotting ability. °? Blood tests to check your liver and kidney function. °? A chest X-ray to look for a tear in the esophagus. °? Endoscopy. In this procedure, a flexible scope is put down your esophagus and into your stomach or duodenum to look for the source of bleeding. °? Angiogram. This may be done if the source of bleeding is not found during endoscopy. For an angiogram, X-rays are taken after a dye is injected into your bloodstream. °? Nasogastric tube insertion. This is a tube passed through your nose and down into your stomach. It may be connected to a source of gentle suction to see if any blood comes out. °How is this treated? °Treatment for this condition depends on the cause of your bleeding. Active bleeding is treated at the hospital. Treatment may include: °· Getting fluids through an IV inserted into one of your veins. °· Getting blood through an IV (blood transfusion). °· Getting high doses of medicine through the IV to lower stomach acid. This may be done to treat ulcer disease. °· Having endoscopy to treat an area of bleeding with high heat (coagulation), injections, or surgical clips. °· Having a procedure that involves first doing an angiogram and then blocking blood flow to the bleeding site (embolization). °· Stopping or changing some of your regular   medicines for a certain amount of time. °· Having other surgical procedures if initial treatments do not control bleeding. °Follow these instructions at home: °Eating and  drinking °· Follow instructions from your health care provider about eating or drinking restrictions. °· Try to eat foods that have a lot of iron. These may include red meat, shellfish, chicken, eggs, and vegetables such as spinach or broccoli. °· Drink enough fluid to keep your urine pale yellow. °· Do not drink alcohol.   °General instructions °· Do not use any products that contain nicotine or tobacco, such as cigarettes, e-cigarettes, and chewing tobacco. If you need help quitting, ask your health care provider. °· Take over-the-counter and prescription medicines only as told by your health care provider. You may need to avoid aspirin, NSAIDs, or other medicines that increase bleeding. °· Return to your normal activities as told by your health care provider. Ask your health care provider what activities are safe for you. °· Keep all follow-up visits as told by your health care provider. This is important.   °Contact a health care provider if: °· You have heartburn or pain in your abdomen. °· You lose weight without trying. °· You have trouble swallowing. °· You vomit often. °· You feel weak or dizzy. °· You need help to stop smoking or drinking alcohol. °Get help right away if: °· You vomit blood. °· You faint. °· You have blood in your stools. °· You develop jaundice. °· You have severe cramps in your back or abdomen. °· Your symptoms of upper GI bleeding come back after treatment. °These symptoms may represent a serious problem that is an emergency. Do not wait to see if the symptoms will go away. Get medical help right away. Call your local emergency services (911 in the [place] Do not drive yourself to the hospital. °Summary °· If you have upper gastrointestinal (GI) bleeding, you may vomit blood or have bloody or black stools (feces). °· Follow instructions from your health care provider about eating or drinking restrictions. °· Do not drink alcohol. Do not use any products that contain nicotine or tobacco,  such as cigarettes, e-cigarettes, and chewing tobacco. °· Take over-the-counter and prescription medicines only as told by your health care provider. You may need to avoid NSAIDs or other medicines that increase bleeding. °This information is not intended to replace advice given to you by your health care provider. Make sure you discuss any questions you have with your health care provider. °Document Revised: 06/12/2019 Document Reviewed: 06/12/2019 °Elsevier Patient Education © 2021 [employer] Inc. ° °

## 2020-08-15 NOTE — Progress Notes (Signed)
Patient was given discharge instructions, and all questions were answered.  Patient was stable for discharge and was taken to the main exit by wheelchair. 

## 2020-08-15 NOTE — Discharge Summary (Signed)
Physician Discharge Summary  Debbie Barry LOV:[id number] DOB: 1950-09-10 DOA: 08/12/2020  PCP: Debbie Chard, MD  Admit date: 08/12/2020 Discharge date: 08/15/2020  Admitted From: home Disposition:  home  Recommendations for Outpatient Follow-up:  1. Follow up with PCP in 1 week 2. Follow up with GI in 4 weeks  3. Discussed cessation of aspirin and any over the counter ibuprofen, NSAID products   Discharge Condition: Stable CODE STATUS: Full  Diet recommendation: Regular diet   Brief/Interim Summary: Debbie Student Brooksis a 70 y.o.femalewith medical history significant ofHTN, GERD who presents with generalized abdominal pain, hematemesis over the past couple of days as well as melena over the past couple of months.She thought her magnesium supplementation was making her stools dark.She takes aspirin325mg daily and aleve PRN(last dose 2/11). She states that she fell a few days ago and since then has been taking 1 aleve per day. She admits to lightheadedness and dizziness, denies CP, SOB, cough.Her last colonoscopy was in 2015 with Dr. Benson Barry, found polyp and diverticula. She underwent EGD on 2/14 which revealed nonbleeding gastric ulcer. She had stabilization of hgb without further hematemesis or melenotic stools.   Discharge Diagnoses:  Principal Problem:   UGIB (upper gastrointestinal bleed) Active Problems:   Essential hypertension, benign   Depression   Hypokalemia   GI bleed   UGIB, hematemesis as well as melena, secondary to gastric ulcer  -FOBT positive -Hold aspirin, naproxen -GI consulted, s/p EGD 2/14, results below   Impression:       - Tiny hiatal hernia.                           - Non-bleeding gastric ulcer with no stigmata of                            bleeding.                           - Chronic gastritis. Biopsied.                           - Caustic duodenitis.                           - Normal second portion of the duodenum and third                             portion of the duodenum.                           - The examination was otherwise normal.  HTN -Norvasc   Depression -Zoloft   Hypokalemia -Replaced  Hypomagnesemia -Replaced   Discharge Instructions  Discharge Instructions    Call MD for:  difficulty breathing, headache or visual disturbances   Complete by: As directed    Call MD for:  extreme fatigue   Complete by: As directed    Call MD for:  persistant dizziness or light-headedness   Complete by: As directed    Call MD for:  persistant nausea and vomiting   Complete by: As directed    Call MD for:  severe uncontrolled pain   Complete by: As directed    Call MD for:  temperature >100.4  Complete by: As directed    Discharge instructions   Complete by: As directed    You were cared for by a hospitalist during your hospital stay. If you have any questions about your discharge medications or the care you received while you were in the hospital after you are discharged, you can call the unit and ask to speak with the hospitalist on call if the hospitalist that took care of you is not available. Once you are discharged, your primary care physician will handle any further medical issues. Please note that NO REFILLS for any discharge medications will be authorized once you are discharged, as it is imperative that you return to your primary care physician (or establish a relationship with a primary care physician if you do not have one) for your aftercare needs so that they can reassess your need for medications and monitor your lab values.   Increase activity slowly   Complete by: As directed      Allergies as of 08/15/2020   No Known Allergies     Medication List    STOP taking these medications   aspirin EC 325 MG tablet   carvedilol 12.5 MG tablet Commonly known as: COREG   naproxen sodium 220 MG tablet Commonly known as: ALEVE   potassium chloride SA 20 MEQ tablet Commonly known as:  KLOR-CON     TAKE these medications   acetaminophen 650 MG CR tablet Commonly known as: TYLENOL Take 650 mg by mouth every 8 (eight) hours as needed for pain.   amLODipine 5 MG tablet Commonly known as: NORVASC TAKE 1 TABLET(5 MG) BY MOUTH DAILY   Magnesium 400 MG Caps Take 1 capsule by mouth daily at bedtime   multivitamin with minerals Tabs tablet Take 1 tablet by mouth daily.   pantoprazole 40 MG tablet Commonly known as: PROTONIX Take 1 tablet (40 mg total) by mouth 2 (two) times daily before a meal. Take 40mg  twice daily for 2 weeks, then decrease to 40mg  once daily   sertraline 25 MG tablet Commonly known as: ZOLOFT Take 1 tablet (25 mg total) by mouth daily.   VISINE OP Place 2 drops into both eyes daily as needed (for dry/irritated eyes).   Vitamin D3 125 MCG (5000 UT) Caps Take 5,000 Units by mouth daily.       Follow-up Information    Debbie Chard, MD. Schedule an appointment as soon as possible for a visit.   Specialty: Internal Medicine Contact information: [address] [place] [address] [phone]        Arta Silence, MD. Schedule an appointment as soon as possible for a visit in 4 week(s).   Specialty: Gastroenterology Contact information: [address] [place] [address] [phone]              No Known Allergies  Consultations:  GI   Procedures/Studies: DG Chest 2 View  Result Date: 08/12/2020 CLINICAL DATA:  Vomiting and diarrhea for the past 2 days. EXAM: CHEST - 2 VIEW COMPARISON:  None. FINDINGS: The heart size and mediastinal contours are within normal limits. Both lungs are clear. The visualized skeletal structures are unremarkable. IMPRESSION: No active cardiopulmonary disease. Electronically Signed   By: Titus Dubin M.D.   On: 08/12/2020 13:02   EGD 2/14 Impression:       - Tiny hiatal hernia.                           -  Non-bleeding gastric ulcer with no stigmata of                             bleeding.                           - Chronic gastritis. Biopsied.                           - Caustic duodenitis.                           - Normal second portion of the duodenum and third                            portion of the duodenum.                           - The examination was otherwise normal.      Discharge Exam: Vitals:   08/14/20 2108 08/15/20 0622  BP: (!) 143/89 115/64  Pulse: 81 80  Resp:  18  Temp: 98.8 F (37.1 C) 97.9 F (36.6 C)  SpO2: 100% 98%     General: Pt is alert, awake, not in acute distress Cardiovascular: RRR, S1/S2 +, no edema Respiratory: CTA bilaterally, no wheezing, no rhonchi, no respiratory distress, no conversational dyspnea  Abdominal: Soft, NT, ND, bowel sounds + Extremities: no edema, no cyanosis Psych: Normal mood and affect, stable judgement and insight     The results of significant diagnostics from this hospitalization (including imaging, microbiology, ancillary and laboratory) are listed below for reference.     Microbiology: Recent Results (from the past 240 hour(s))  Urine culture     Status: Abnormal   Collection Time: 08/12/20 12:23 PM   Specimen: Urine, Clean Catch  Result Value Ref Range Status   Specimen Description   Final    URINE, CLEAN CATCH Performed at [facility], [place] [address], [place], [place] [address]    Special Requests   Final    NONE Performed at [facility], [place] [address], [place], [place] [address]    Culture MULTIPLE SPECIES PRESENT, SUGGEST RECOLLECTION (A)  Final   Report Status 08/14/2020 FINAL  Final  Resp Panel by RT-PCR (Flu A&B, Covid) Nasopharyngeal Swab     Status: None   Collection Time: 08/12/20  8:02 PM   Specimen: Nasopharyngeal Swab; Nasopharyngeal(NP) swabs in vial transport medium  Result Value Ref Range Status   SARS Coronavirus 2 by RT PCR NEGATIVE NEGATIVE Final    Comment: (NOTE) SARS-CoV-2 target nucleic  acids are NOT DETECTED.  The SARS-CoV-2 RNA is generally detectable in upper respiratory specimens during the acute phase of infection. The lowest concentration of SARS-CoV-2 viral copies this assay can detect is 138 copies/mL. A negative result does not preclude SARS-Cov-2 infection and should not be used as the sole basis for treatment or other patient management decisions. A negative result may occur with  improper specimen collection/handling, submission of specimen other than nasopharyngeal swab, presence of viral mutation(s) within the areas targeted by this assay, and inadequate number of viral copies(<138 copies/mL). A negative result must be combined with clinical observations, patient history, and epidemiological information. The expected result is Negative.  Fact Sheet for  Patients:  EntrepreneurPulse.com.au  Fact Sheet for Healthcare Providers:  IncredibleEmployment.be  This test is no t yet approved or cleared by the [place] FDA and  has been authorized for detection and/or diagnosis of SARS-CoV-2 by FDA under an Emergency Use Authorization (EUA). This EUA will remain  in effect (meaning this test can be used) for the duration of the COVID-19 declaration under Section 564(b)(1) of the Act, 21 U.S.C.section 360bbb-3(b)(1), unless the authorization is terminated  or revoked sooner.       Influenza A by PCR NEGATIVE NEGATIVE Final   Influenza B by PCR NEGATIVE NEGATIVE Final    Comment: (NOTE) The Xpert Xpress SARS-CoV-2/FLU/RSV plus assay is intended as an aid in the diagnosis of influenza from Nasopharyngeal swab specimens and should not be used as a sole basis for treatment. Nasal washings and aspirates are unacceptable for Xpert Xpress SARS-CoV-2/FLU/RSV testing.  Fact Sheet for Patients: EntrepreneurPulse.com.au  Fact Sheet for Healthcare Providers: IncredibleEmployment.be  This  test is not yet approved or cleared by the [place] FDA and has been authorized for detection and/or diagnosis of SARS-CoV-2 by FDA under an Emergency Use Authorization (EUA). This EUA will remain in effect (meaning this test can be used) for the duration of the COVID-19 declaration under Section 564(b)(1) of the Act, 21 U.S.C. section 360bbb-3(b)(1), unless the authorization is terminated or revoked.  Performed at [facility], [place] [address], [place], [place] [address]      Labs: BNP (last 3 results) No results for input(s): BNP in the last 8760 hours. Basic Metabolic Panel: Recent Labs  Lab 08/12/20 1423 08/13/20 0738 08/14/20 0528 08/15/20 0500  NA 140 140 141 140  K 3.3* 3.4* 3.1* 3.1*  CL 98 103 104 108  CO2 27 27 26 25   GLUCOSE 102* 79 76 78  BUN 12 17 13 8   CREATININE 0.54 0.64 0.63 0.53  CALCIUM 7.9* 7.5* 8.1* 7.5*  MG  --   --  1.5* 1.7   Liver Function Tests: Recent Labs  Lab 08/12/20 1423  AST 41  ALT 29  ALKPHOS 122  BILITOT 1.1  PROT 5.6*  ALBUMIN 2.6*   Recent Labs  Lab 08/12/20 1423  LIPASE 23   No results for input(s): AMMONIA in the last 168 hours. CBC: Recent Labs  Lab 08/12/20 1322 08/13/20 1114 08/14/20 0528 08/15/20 0500  WBC 4.8  --  5.2 3.5*  NEUTROABS 3.7  --   --   --   HGB 12.3 9.2* 10.4* 8.1*  HCT 36.1 27.8* 31.5* 24.2*  MCV 104.0*  --  105.4* 104.8*  PLT 105*  --  157 127*   Cardiac Enzymes: No results for input(s): CKTOTAL, CKMB, CKMBINDEX, TROPONINI in the last 168 hours. BNP: Invalid input(s): POCBNP CBG: No results for input(s): GLUCAP in the last 168 hours. D-Dimer No results for input(s): DDIMER in the last 72 hours. Hgb A1c No results for input(s): HGBA1C in the last 72 hours. Lipid Profile No results for input(s): CHOL, HDL, LDLCALC, TRIG, CHOLHDL, LDLDIRECT in the last 72 hours. Thyroid function studies No results for input(s): TSH, T4TOTAL, T3FREE, THYROIDAB in the last 72  hours.  Invalid input(s): FREET3 Anemia work up No results for input(s): VITAMINB12, FOLATE, FERRITIN, TIBC, IRON, RETICCTPCT in the last 72 hours. Urinalysis    Component Value Date/Time   COLORURINE YELLOW 08/12/2020 [place] 08/12/2020 1223   LABSPEC 1.020 08/12/2020 1223   PHURINE 7.0 08/12/2020 1223   GLUCOSEU NEGATIVE  08/12/2020 [place] 08/12/2020 [place] 08/12/2020 1223   BILIRUBINUR negative 03/30/2020 1215   KETONESUR 80 (A) 08/12/2020 1223   PROTEINUR NEGATIVE 08/12/2020 1223   UROBILINOGEN 0.2 03/30/2020 1215   NITRITE NEGATIVE 08/12/2020 1223   LEUKOCYTESUR NEGATIVE 08/12/2020 1223   Sepsis Labs Invalid input(s): PROCALCITONIN,  WBC,  LACTICIDVEN Microbiology Recent Results (from the past 240 hour(s))  Urine culture     Status: Abnormal   Collection Time: 08/12/20 12:23 PM   Specimen: Urine, Clean Catch  Result Value Ref Range Status   Specimen Description   Final    URINE, CLEAN CATCH Performed at [facility], [place] [address] [place], [place] [address]    Special Requests   Final    NONE Performed at [facility], [place] [address] [place], [place] [address]    Culture MULTIPLE SPECIES PRESENT, SUGGEST RECOLLECTION (A)  Final   Report Status 08/14/2020 FINAL  Final  Resp Panel by RT-PCR (Flu A&B, Covid) Nasopharyngeal Swab     Status: None   Collection Time: 08/12/20  8:02 PM   Specimen: Nasopharyngeal Swab; Nasopharyngeal(NP) swabs in vial transport medium  Result Value Ref Range Status   SARS Coronavirus 2 by RT PCR NEGATIVE NEGATIVE Final    Comment: (NOTE) SARS-CoV-2 target nucleic acids are NOT DETECTED.  The SARS-CoV-2 RNA is generally detectable in upper respiratory specimens during the acute phase of infection. The lowest concentration of SARS-CoV-2 viral copies this assay can detect is 138 copies/mL. A negative result does not preclude SARS-Cov-2 infection  and should not be used as the sole basis for treatment or other patient management decisions. A negative result may occur with  improper specimen collection/handling, submission of specimen other than nasopharyngeal swab, presence of viral mutation(s) within the areas targeted by this assay, and inadequate number of viral copies(<138 copies/mL). A negative result must be combined with clinical observations, patient history, and epidemiological information. The expected result is Negative.  Fact Sheet for Patients:  EntrepreneurPulse.com.au  Fact Sheet for Healthcare Providers:  IncredibleEmployment.be  This test is no t yet approved or cleared by the [place] FDA and  has been authorized for detection and/or diagnosis of SARS-CoV-2 by FDA under an Emergency Use Authorization (EUA). This EUA will remain  in effect (meaning this test can be used) for the duration of the COVID-19 declaration under Section 564(b)(1) of the Act, 21 U.S.C.section 360bbb-3(b)(1), unless the authorization is terminated  or revoked sooner.       Influenza A by PCR NEGATIVE NEGATIVE Final   Influenza B by PCR NEGATIVE NEGATIVE Final    Comment: (NOTE) The Xpert Xpress SARS-CoV-2/FLU/RSV plus assay is intended as an aid in the diagnosis of influenza from Nasopharyngeal swab specimens and should not be used as a sole basis for treatment. Nasal washings and aspirates are unacceptable for Xpert Xpress SARS-CoV-2/FLU/RSV testing.  Fact Sheet for Patients: EntrepreneurPulse.com.au  Fact Sheet for Healthcare Providers: IncredibleEmployment.be  This test is not yet approved or cleared by the [place] FDA and has been authorized for detection and/or diagnosis of SARS-CoV-2 by FDA under an Emergency Use Authorization (EUA). This EUA will remain in effect (meaning this test can be used) for the duration of the COVID-19 declaration  under Section 564(b)(1) of the Act, 21 U.S.C. section 360bbb-3(b)(1), unless the authorization is terminated or revoked.  Performed at [facility], [place] [address], [place],  [address]  Patient was seen and examined on the day of discharge and was found to be in stable condition. Time coordinating discharge: 20 minutes including assessment and coordination of care, as well as examination of the patient.   SIGNED:  Dessa Phi, DO [facility] 08/15/2020, 9:00 AM

## 2020-08-22 ENCOUNTER — Encounter: Payer: Self-pay | Admitting: Nurse Practitioner

## 2020-08-22 ENCOUNTER — Ambulatory Visit: Payer: Medicare HMO | Admitting: Internal Medicine

## 2020-08-22 ENCOUNTER — Ambulatory Visit (INDEPENDENT_AMBULATORY_CARE_PROVIDER_SITE_OTHER): Payer: Medicare HMO | Admitting: Nurse Practitioner

## 2020-08-22 ENCOUNTER — Other Ambulatory Visit: Payer: Self-pay

## 2020-08-22 VITALS — BP 130/72 | HR 60 | Temp 98.0°F | Ht 61.0 in | Wt 141.8 lb

## 2020-08-22 DIAGNOSIS — K2950 Unspecified chronic gastritis without bleeding: Secondary | ICD-10-CM | POA: Diagnosis not present

## 2020-08-22 DIAGNOSIS — E876 Hypokalemia: Secondary | ICD-10-CM

## 2020-08-22 DIAGNOSIS — E789 Disorder of lipoprotein metabolism, unspecified: Secondary | ICD-10-CM | POA: Diagnosis not present

## 2020-08-22 DIAGNOSIS — I10 Essential (primary) hypertension: Secondary | ICD-10-CM

## 2020-08-22 DIAGNOSIS — K922 Gastrointestinal hemorrhage, unspecified: Secondary | ICD-10-CM

## 2020-08-22 DIAGNOSIS — F3289 Other specified depressive episodes: Secondary | ICD-10-CM | POA: Diagnosis not present

## 2020-08-22 MED ORDER — METRONIDAZOLE 500 MG PO TABS: 500.0000 mg | ORAL_TABLET | Freq: Three times a day (TID) | ORAL | 0 refills | Status: AC

## 2020-08-22 MED ORDER — SERTRALINE HCL 25 MG PO TABS: 25.0000 mg | ORAL_TABLET | Freq: Every day | ORAL | 1 refills | Status: DC

## 2020-08-22 MED ORDER — AMLODIPINE BESYLATE 5 MG PO TABS: 5.0000 mg | ORAL_TABLET | Freq: Every day | ORAL | 1 refills | Status: DC

## 2020-08-22 NOTE — Progress Notes (Signed)
This visit occurred during the SARS-CoV-2 public health emergency.  Safety protocols were in place, including screening questions prior to the visit, additional usage of staff PPE, and extensive cleaning of exam room while observing appropriate contact time as indicated for disinfecting solutions.  Subjective:     Patient ID: Debbie Barry , female    DOB: [birth year] , 70 y.o.   MRN: [id number]   Chief Complaint  Patient presents with  . hospital f/u     Patient stated she has been feeling real weak and tired    HPI  Patient presents today for a hospital f/u. Patient stated she has been feeling really weak and tired.  Debbie Barry is a 70 y.o. female with medical history significant of HTN, GERD who presents with generalized abdominal pain, hematemesis over the past couple of days as well as melena over the past couple of months. She thought her magnesium supplementation was making her stools dark. She takes aspirin $RemoveBefor'325mg'OuVXmzxXCnes$  daily and aleve PRN (last dose 2/11). She states that she fell a few days ago and since then has been taking 1 aleve per day. She admits to lightheadedness and dizziness, denies CP, SOB, cough. Her last colonoscopy was in 2015 with Dr. Benson [place], found polyp and diverticula. She underwent EGD on 2/14 which revealed nonbleeding gastric ulcer. She had stabilization of hgb without further hematemesis or melenotic stools.   She reports they found an ulcer. She is to avoid aleve, aspirin and ibuprofen.  She is not a drinker. She had fallen the week before.  She lives with her husband.    She has not required a nurse or physical therapy.   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 Fatty liver   . GERD (gastroesophageal reflux disease)   . Hypertension   . Osteomyelitis ([place])    right third toe  . Peripheral vascular disease ([place])      Family History  Problem Relation Age of Onset  . Breast  cancer Mother   . Colon cancer Mother   . Heart Problems Mother   . Hypertension Mother   . Multiple myeloma Mother   . Hypertension Father   . Heart disease Father   . Multiple myeloma Father      Current Outpatient Medications:  .  acetaminophen (TYLENOL) 650 MG CR tablet, Take 650 mg by mouth every 8 (eight) hours as needed for pain., Disp: , Rfl:  .  Cholecalciferol (VITAMIN D3) 5000 units CAPS, Take 5,000 Units by mouth daily., Disp: , Rfl:  .  metroNIDAZOLE (FLAGYL) 500 MG tablet, Take 1 tablet (500 mg total) by mouth 3 (three) times daily for 10 days., Disp: 30 tablet, Rfl: 0 .  Multiple Vitamin (MULTIVITAMIN WITH MINERALS) TABS tablet, Take 1 tablet by mouth daily., Disp: , Rfl:  .  pantoprazole (PROTONIX) 40 MG tablet, Take 1 tablet (40 mg total) by mouth 2 (two) times daily before a meal. Take $RemoveBe'40mg'CrSOuJtdr$  twice daily for 2 weeks, then decrease to $RemoveBef'40mg'vHHTUvGUDz$  once daily, Disp: 60 tablet, Rfl: 0 .  Tetrahydrozoline HCl (VISINE OP), Place 2 drops into both eyes daily as needed (for dry/irritated eyes). , Disp: , Rfl:  .  amLODipine (NORVASC) 5 MG tablet, Take 1 tablet (5 mg total) by mouth daily., Disp: 90 tablet, Rfl: 1 .  Magnesium 400 MG CAPS, Take 1 capsule by mouth daily at bedtime (Patient not taking: Reported  on 08/22/2020), Disp: 90 capsule, Rfl: 1 .  sertraline (ZOLOFT) 25 MG tablet, Take 1 tablet (25 mg total) by mouth daily., Disp: 90 tablet, Rfl: 1   No Known Allergies   Review of Systems  Constitutional: Positive for fatigue.  HENT: Negative.   Eyes: Negative.   Respiratory: Positive for shortness of breath.   Cardiovascular: Negative.  Negative for chest pain, palpitations and leg swelling.  Gastrointestinal: Negative.   Endocrine: Negative.   Genitourinary: Negative.   Musculoskeletal: Negative.   Skin: Negative.   Neurological: Positive for weakness.  Hematological: Negative.   Psychiatric/Behavioral: Negative.      Today's Vitals   08/22/20 1432  BP: 130/72  Pulse:  60  Temp: 98 F (36.7 C)  TempSrc: Oral  Weight: 141 lb 12.8 oz (64.3 kg)  Height: 5\' 1"  (1.549 m)  PainSc: 3   PainLoc: Leg   Body mass index is 26.79 kg/m.   Objective:  Physical Exam Constitutional:      General: She is not in acute distress.    Appearance: Normal appearance. She is normal weight.  Cardiovascular:     Rate and Rhythm: Normal rate and regular rhythm.     Pulses: Normal pulses.     Heart sounds: Normal heart sounds. No murmur heard.   Pulmonary:     Effort: Pulmonary effort is normal. No respiratory distress.     Breath sounds: Normal breath sounds. No wheezing.  Abdominal:     General: Abdomen is flat. Bowel sounds are normal.     Palpations: Abdomen is soft.  Musculoskeletal:        General: Normal range of motion.     Cervical back: Normal range of motion and neck supple.  Skin:    General: Skin is warm and dry.     Capillary Refill: Capillary refill takes less than 2 seconds.  Neurological:     General: No focal deficit present.     Mental Status: She is alert and oriented to person, place, and time.  Psychiatric:        Mood and Affect: Mood normal.        Behavior: Behavior normal.        Thought Content: Thought content normal.        Judgment: Judgment normal.         Assessment And Plan:     1. UGIB (upper gastrointestinal bleed)  She was admitted to the hospital from 2/12-2/15 after having an upper GI bleed. She had been taking an excess amount of Aleve. She had an EGD on 2/14 which revealed a nonbleeding gastric ulcer. She is to follow up with GI in 4 weeks. She did not have any bloody stools while hospitalized.  - H Pylori, IGM, IGG, IGA AB - CBC  2. Other chronic gastritis without hemorrhage  She is to take metronidazole for her gastritis - metroNIDAZOLE (FLAGYL) 500 MG tablet; Take 1 tablet (500 mg total) by mouth 3 (three) times daily for 10 days.  Dispense: 30 tablet; Refill: 0  3. Essential hypertension, benign  Chronic,  fair control  Continue with current medications.  - amLODipine (NORVASC) 5 MG tablet; Take 1 tablet (5 mg total) by mouth daily.  Dispense: 90 tablet; Refill: 1  4. Other depression  Chronic, stable  Continue with current medications - sertraline (ZOLOFT) 25 MG tablet; Take 1 tablet (25 mg total) by mouth daily.  Dispense: 90 tablet; Refill: 1  5. Abnormal serum cholesterol  Chronic  Will  check lipid panel - Lipid panel  6. Hypokalemia  Will recheck levels  Encouraged to eat foods high in potassium - CMP14+EGFR     Patient was given opportunity to ask questions. Patient verbalized understanding of the plan and was able to repeat key elements of the plan. All questions were answered to their satisfaction.  Minette Brine, FNP   I, Minette Brine, FNP, have reviewed all documentation for this visit. The documentation on 08/22/20 for the exam, diagnosis, procedures, and orders are all accurate and complete.   THE PATIENT IS ENCOURAGED TO PRACTICE SOCIAL DISTANCING DUE TO THE COVID-19 PANDEMIC.

## 2020-08-23 LAB — CBC
Hematocrit: 29.9 % — ABNORMAL LOW (ref 34.0–46.6)
Hemoglobin: 10.2 g/dL — ABNORMAL LOW (ref 11.1–15.9)
MCH: 34.2 pg — ABNORMAL HIGH (ref 26.6–33.0)
MCHC: 34.1 g/dL (ref 31.5–35.7)
MCV: 100 fL — ABNORMAL HIGH (ref 79–97)
Platelets: 331 10*3/uL (ref 150–450)
RBC: 2.98 x10E6/uL — ABNORMAL LOW (ref 3.77–5.28)
RDW: 13 % (ref 11.7–15.4)
WBC: 3.5 10*3/uL (ref 3.4–10.8)

## 2020-08-23 LAB — LIPID PANEL
Chol/HDL Ratio: 2.8 ratio (ref 0.0–4.4)
Cholesterol, Total: 146 mg/dL (ref 100–199)
HDL: 53 mg/dL (ref >39)
LDL Chol Calc (NIH): 77 mg/dL (ref 0–99)
Triglycerides: 83 mg/dL (ref 0–149)
VLDL Cholesterol Cal: 16 mg/dL (ref 5–40)

## 2020-08-23 LAB — CMP14+EGFR
ALT: 24 IU/L (ref 0–32)
AST: 44 IU/L — ABNORMAL HIGH (ref 0–40)
Albumin/Globulin Ratio: 1.2 (ref 1.2–2.2)
Albumin: 2.8 g/dL — ABNORMAL LOW (ref 3.8–4.8)
Alkaline Phosphatase: 158 IU/L — ABNORMAL HIGH (ref 44–121)
BUN/Creatinine Ratio: 4 — ABNORMAL LOW (ref 12–28)
BUN: 3 mg/dL — ABNORMAL LOW (ref 8–27)
Bilirubin Total: 0.5 mg/dL (ref 0.0–1.2)
CO2: 23 mmol/L (ref 20–29)
Calcium: 8.2 mg/dL — ABNORMAL LOW (ref 8.7–10.3)
Chloride: 102 mmol/L (ref 96–106)
Creatinine, Ser: 0.67 mg/dL (ref 0.57–1.00)
GFR calc Af Amer: 105 mL/min/{1.73_m2} (ref >59)
GFR calc non Af Amer: 91 mL/min/{1.73_m2} (ref >59)
Globulin, Total: 2.3 g/dL (ref 1.5–4.5)
Glucose: 99 mg/dL (ref 65–99)
Potassium: 3.4 mmol/L — ABNORMAL LOW (ref 3.5–5.2)
Sodium: 139 mmol/L (ref 134–144)
Total Protein: 5.1 g/dL — ABNORMAL LOW (ref 6.0–8.5)

## 2020-08-23 LAB — H PYLORI, IGM, IGG, IGA AB
H pylori, IgM Abs: <9 units (ref 0.0–8.9)
H. pylori, IgA Abs: <9 units (ref 0.0–8.9)
H. pylori, IgG AbS: 0.55 Index Value (ref 0.00–0.79)

## 2020-08-25 ENCOUNTER — Ambulatory Visit: Payer: Medicare HMO

## 2020-08-31 ENCOUNTER — Encounter: Payer: Self-pay | Admitting: Nurse Practitioner

## 2020-09-18 DIAGNOSIS — D649 Anemia, unspecified: Secondary | ICD-10-CM | POA: Diagnosis not present

## 2020-09-18 DIAGNOSIS — K921 Melena: Secondary | ICD-10-CM | POA: Diagnosis not present

## 2020-09-18 DIAGNOSIS — R748 Abnormal levels of other serum enzymes: Secondary | ICD-10-CM | POA: Diagnosis not present

## 2020-09-29 DIAGNOSIS — K921 Melena: Secondary | ICD-10-CM | POA: Diagnosis not present

## 2020-10-12 ENCOUNTER — Ambulatory Visit: Payer: Medicare HMO

## 2020-10-13 ENCOUNTER — Inpatient Hospital Stay: Admission: RE | Admit: 2020-10-13 | Payer: Medicare HMO | Source: Ambulatory Visit

## 2020-10-19 DIAGNOSIS — D649 Anemia, unspecified: Secondary | ICD-10-CM | POA: Diagnosis not present

## 2020-11-20 ENCOUNTER — Ambulatory Visit: Payer: Medicare HMO | Admitting: Internal Medicine

## 2020-12-13 ENCOUNTER — Ambulatory Visit
Admission: RE | Admit: 2020-12-13 | Discharge: 2020-12-13 | Disposition: A | Discharge status: Home / Self Care | Payer: Medicare HMO | Source: Ambulatory Visit | Attending: Internal Medicine | Admitting: Internal Medicine

## 2020-12-13 ENCOUNTER — Other Ambulatory Visit: Payer: Self-pay

## 2020-12-13 DIAGNOSIS — Z1231 Encounter for screening mammogram for malignant neoplasm of breast: Secondary | ICD-10-CM

## 2020-12-13 IMAGING — MG MM DIGITAL SCREENING BILAT W/ TOMO AND CAD
6 of 10 series · 6 of 30 positions shown · non-contrast
Comparison: Previous exam(s).

CLINICAL DATA: Screening.

EXAM:
DIGITAL SCREENING BILATERAL MAMMOGRAM WITH TOMOSYNTHESIS AND CAD
TECHNIQUE: Bilateral screening digital craniocaudal and mediolateral oblique
mammograms were obtained. Bilateral screening digital breast
tomosynthesis was performed. The images were evaluated with
computer-aided detection.

[L CC synth-2D]
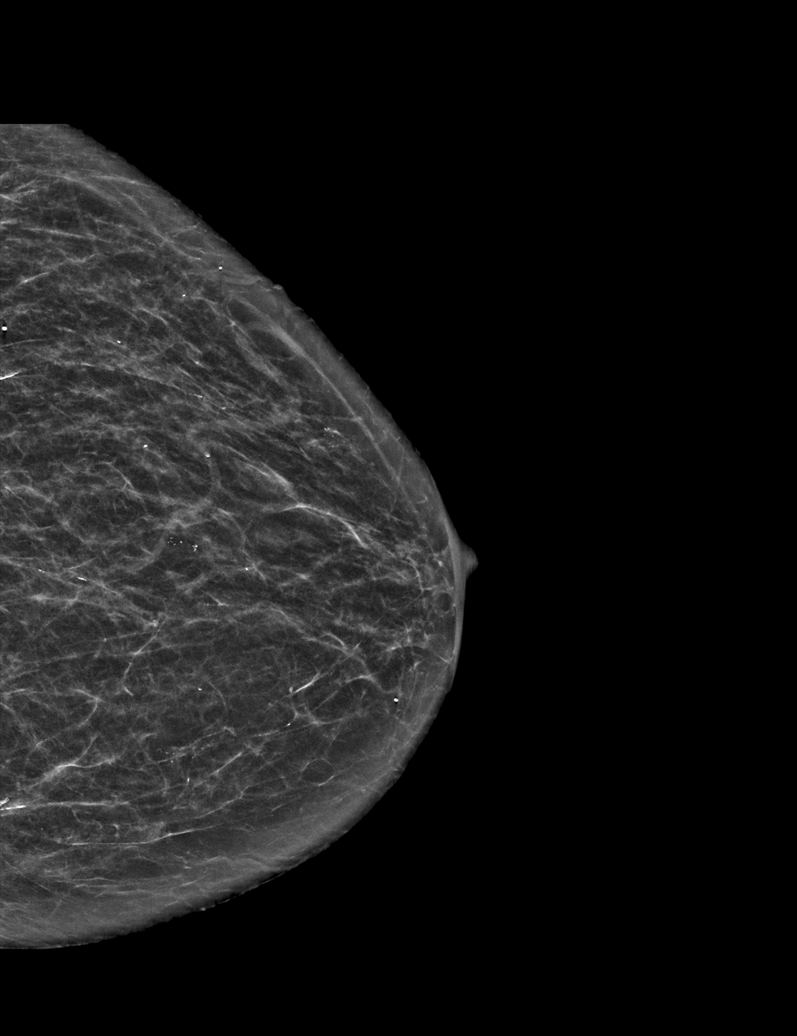

[R CC synth-2D]
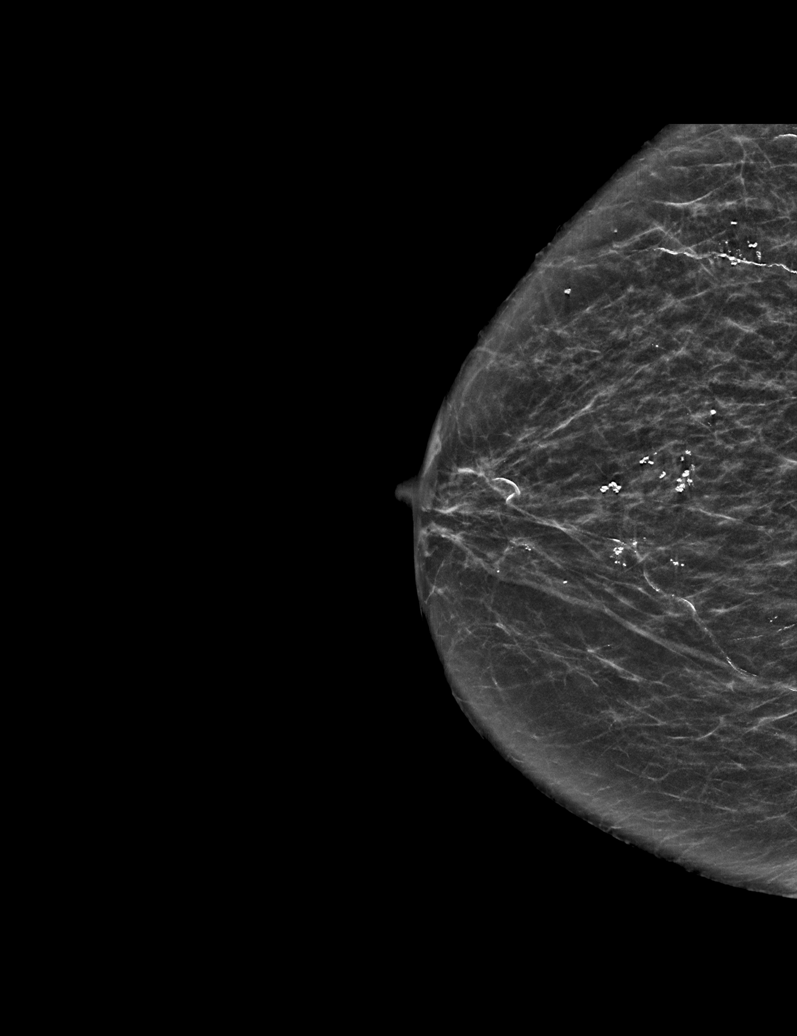

[R MLO synth-2D]
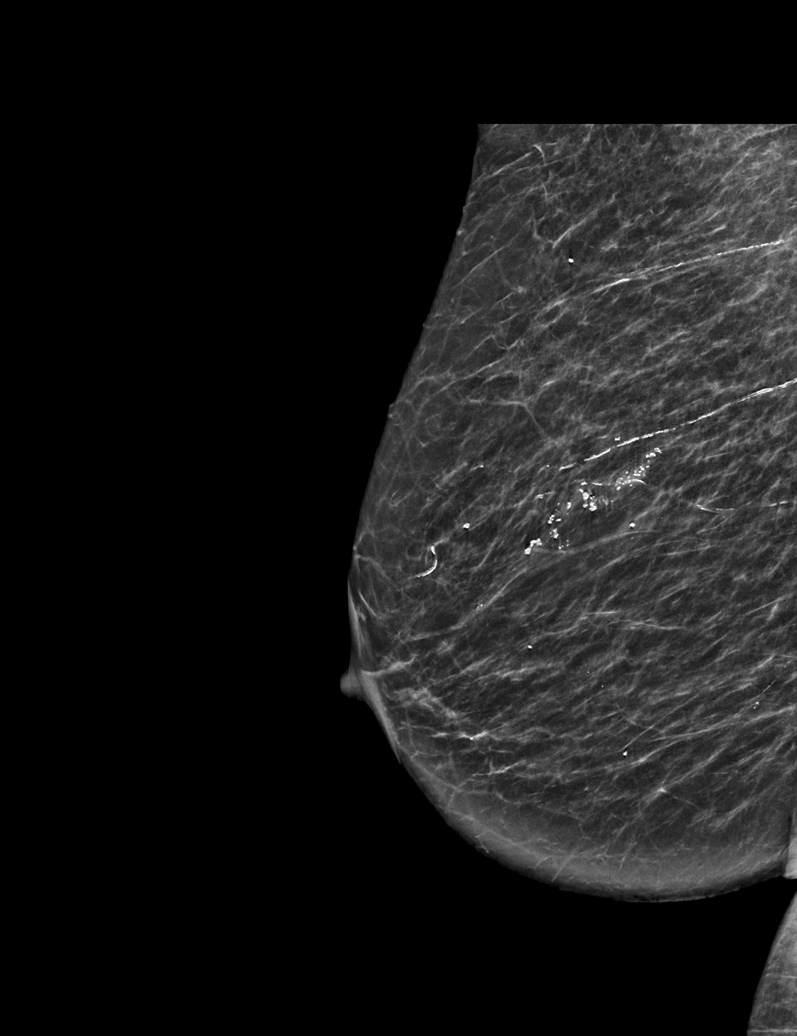

[L MLO synth-2D (1 of 2)]
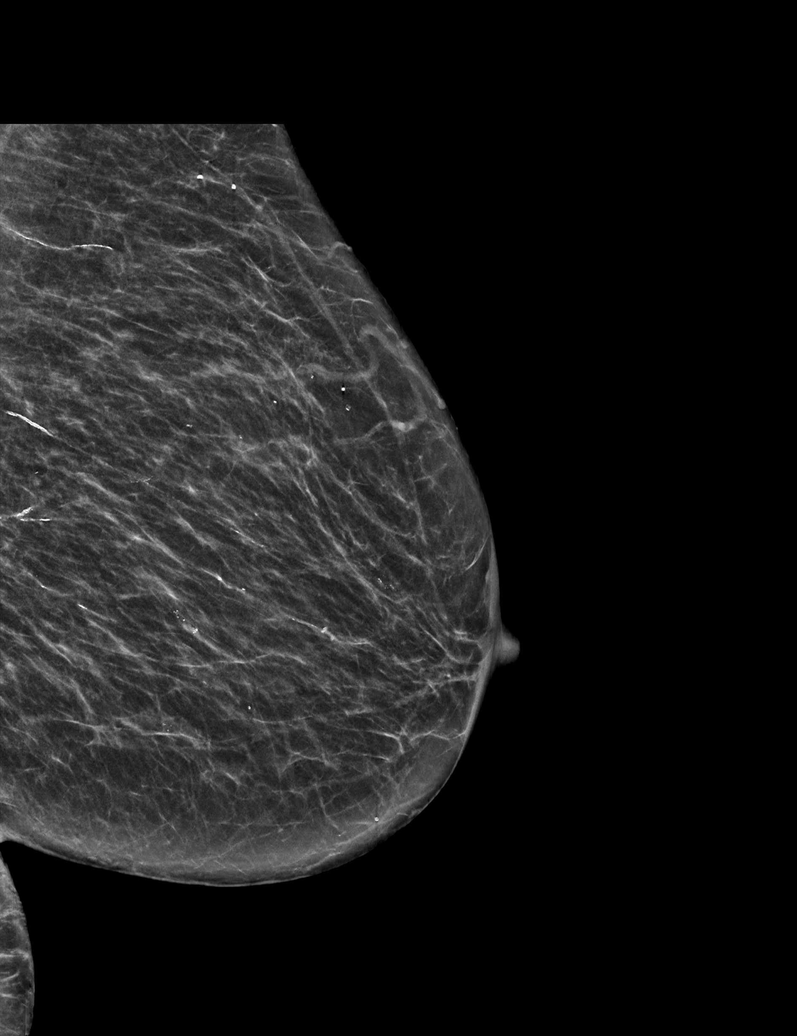

[L MLO synth-2D (2 of 2)]
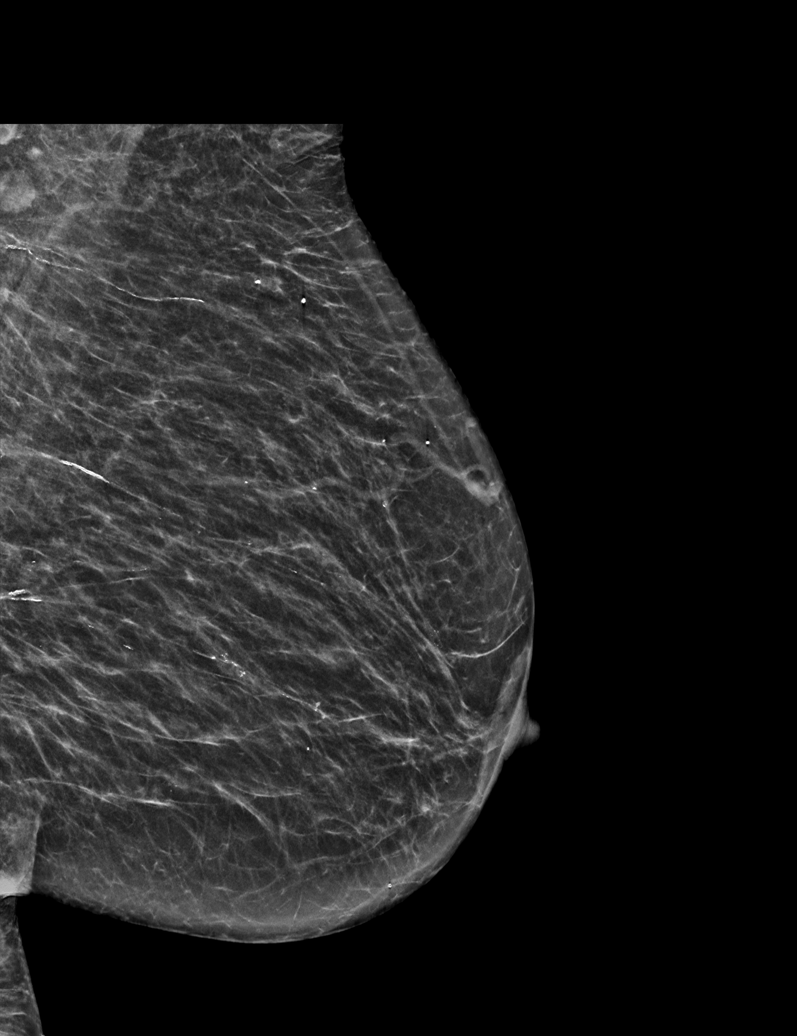

[L MLO tomo · tomo slice 25/50.0]
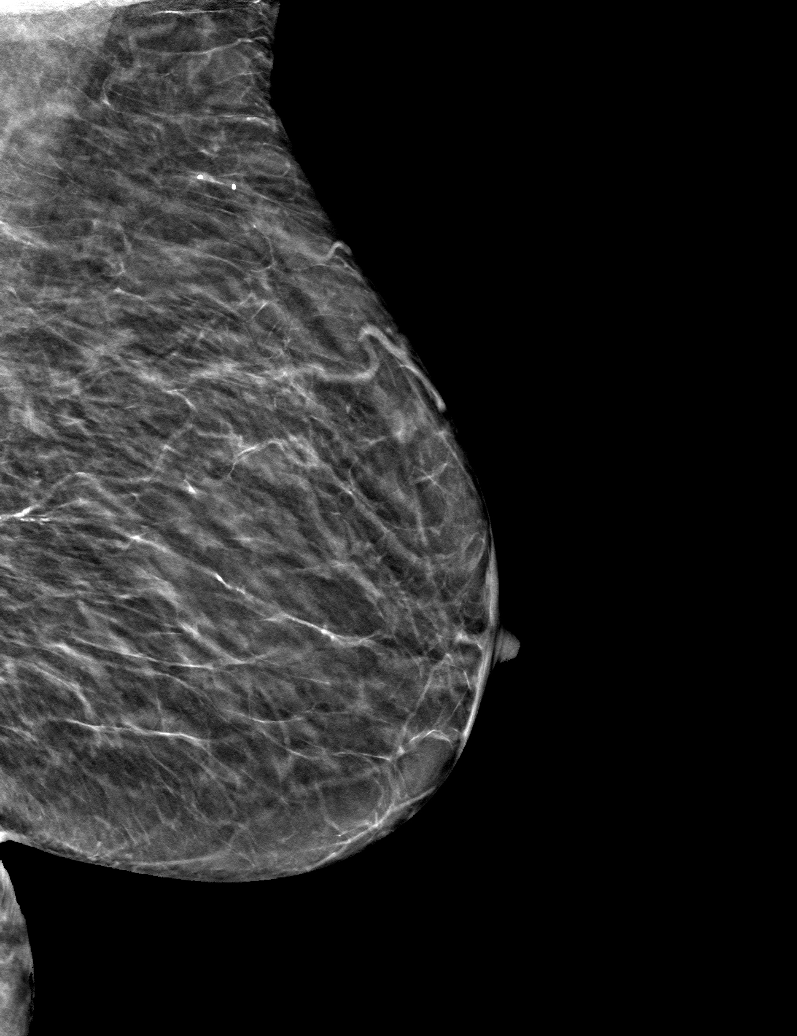

[6 of 30 positions shown; findings below may reference images not displayed]

ACR Breast Density Category b: There are scattered areas of
fibroglandular density.
FINDINGS: There are no findings suspicious for malignancy.
IMPRESSION: No mammographic evidence of malignancy. A result letter of this
screening mammogram will be mailed directly to the patient.

RECOMMENDATION:
Screening mammogram in one year. (Code:[BY])

BI-RADS CATEGORY  1: Negative.

## 2021-01-16 ENCOUNTER — Other Ambulatory Visit: Payer: Self-pay | Admitting: Nurse Practitioner

## 2021-01-16 DIAGNOSIS — F3289 Other specified depressive episodes: Secondary | ICD-10-CM

## 2021-02-01 ENCOUNTER — Other Ambulatory Visit: Payer: Self-pay

## 2021-02-01 ENCOUNTER — Encounter: Payer: Self-pay | Admitting: Nurse Practitioner

## 2021-02-01 ENCOUNTER — Ambulatory Visit (INDEPENDENT_AMBULATORY_CARE_PROVIDER_SITE_OTHER): Payer: Medicare HMO | Admitting: Nurse Practitioner

## 2021-02-01 VITALS — BP 120/86 | HR 105 | Temp 98.1°F | Ht 61.0 in

## 2021-02-01 DIAGNOSIS — L2082 Flexural eczema: Secondary | ICD-10-CM

## 2021-02-01 DIAGNOSIS — F3289 Other specified depressive episodes: Secondary | ICD-10-CM

## 2021-02-01 DIAGNOSIS — R21 Rash and other nonspecific skin eruption: Secondary | ICD-10-CM

## 2021-02-01 DIAGNOSIS — I10 Essential (primary) hypertension: Secondary | ICD-10-CM

## 2021-02-01 DIAGNOSIS — D649 Anemia, unspecified: Secondary | ICD-10-CM | POA: Diagnosis not present

## 2021-02-01 DIAGNOSIS — R200 Anesthesia of skin: Secondary | ICD-10-CM | POA: Diagnosis not present

## 2021-02-01 DIAGNOSIS — R531 Weakness: Secondary | ICD-10-CM

## 2021-02-01 MED ORDER — TRIAMCINOLONE ACETONIDE 0.5 % EX OINT
1.0000 | TOPICAL_OINTMENT | Freq: Two times a day (BID) | CUTANEOUS | 2 refills | Status: DC
Start: 2021-02-01 — End: 2021-03-28

## 2021-02-01 MED ORDER — PREDNISONE 10 MG (21) PO TBPK: ORAL_TABLET | ORAL | 0 refills | Status: DC

## 2021-02-01 MED ORDER — AMLODIPINE BESYLATE 5 MG PO TABS: 5.0000 mg | ORAL_TABLET | Freq: Every day | ORAL | 1 refills | Status: DC

## 2021-02-01 MED ORDER — SERTRALINE HCL 25 MG PO TABS: 25.0000 mg | ORAL_TABLET | Freq: Every day | ORAL | 1 refills | Status: DC

## 2021-02-01 NOTE — Progress Notes (Signed)
I,Katawbba Wiggins,acting as a Education administrator for [employer], NP.,have documented all relevant documentation on the behalf of [employer], NP,as directed by  Bary Castilla, NP while in the presence of Bary Castilla, NP.  This visit occurred during the SARS-CoV-2 public health emergency.  Safety protocols were in place, including screening questions prior to the visit, additional usage of staff PPE, and extensive cleaning of exam room while observing appropriate contact time as indicated for disinfecting solutions.  Subjective:     Patient ID: Debbie Barry , female    DOB: August 08, 1950 , 70 y.o.   MRN: [id number]   Chief Complaint  Patient presents with   Rash    HPI  The patient is here today for evaluation of a rash.  The patient states she has a history of eczema.  She also states that she is feeling weak all the time.     Past Medical History:  Diagnosis Date   Fatty liver    GERD (gastroesophageal reflux disease)    Hypertension    Osteomyelitis (HCC)    right third toe   Peripheral vascular disease ([place])      Family History  Problem Relation Age of Onset   Breast cancer Mother    Colon cancer Mother    Heart Problems Mother    Hypertension Mother    Multiple myeloma Mother    Hypertension Father    Heart disease Father    Multiple myeloma Father      Current Outpatient Medications:    acetaminophen (TYLENOL) 650 MG CR tablet, Take 650 mg by mouth every 8 (eight) hours as needed for pain., Disp: , Rfl:    Cholecalciferol (VITAMIN D3) 5000 units CAPS, Take 5,000 Units by mouth daily., Disp: , Rfl:    Cyanocobalamin (B-12 PO), Take by mouth., Disp: , Rfl:    Magnesium 400 MG CAPS, Take 1 capsule by mouth daily at bedtime, Disp: 90 capsule, Rfl: 1   Multiple Vitamin (MULTIVITAMIN WITH MINERALS) TABS tablet, Take 1 tablet by mouth daily., Disp: , Rfl:    pantoprazole (PROTONIX) 40 MG tablet, Take 1 tablet (40 mg total) by mouth 2 (two) times daily  before a meal. Take $RemoveBe'40mg'UrcJoNqaD$  twice daily for 2 weeks, then decrease to $RemoveBef'40mg'PmdNgwYDvr$  once daily, Disp: 60 tablet, Rfl: 0   Potassium 99 MG TABS, Take by mouth. 1- 2 daily, Disp: , Rfl:    predniSONE (STERAPRED UNI-PAK 21 TAB) 10 MG (21) TBPK tablet, Use as directed, Disp: 21 tablet, Rfl: 0   Tetrahydrozoline HCl (VISINE OP), Place 2 drops into both eyes daily as needed (for dry/irritated eyes). , Disp: , Rfl:    triamcinolone ointment (KENALOG) 0.5 %, Apply 1 application topically 2 (two) times daily., Disp: 30 g, Rfl: 2   amLODipine (NORVASC) 5 MG tablet, Take 1 tablet (5 mg total) by mouth daily., Disp: 90 tablet, Rfl: 1   sertraline (ZOLOFT) 25 MG tablet, Take 1 tablet (25 mg total) by mouth daily., Disp: 90 tablet, Rfl: 1   No Known Allergies   Review of Systems  Constitutional:  Positive for fatigue and unexpected weight change.  HENT:  Negative for congestion, rhinorrhea, sinus pressure and sinus pain.   Respiratory:  Negative for cough and wheezing.   Cardiovascular:  Negative for chest pain and palpitations.  Skin:  Positive for rash (for 3 weeks).  Neurological:  Positive for weakness and numbness.    Today's Vitals   02/01/21 1435  BP: 120/86  Pulse: (!) 105  Temp: 98.1 F (36.7 C)  TempSrc: Oral  Height: $Remove'5\' 1"'FKiHCes$  (1.549 m)  PainSc: 7   PainLoc: Generalized   Body mass index is 26.79 kg/m.  Wt Readings from Last 3 Encounters:  08/22/20 141 lb 12.8 oz (64.3 kg)  08/14/20 141 lb (64 kg)  03/30/20 159 lb 12.8 oz (72.5 kg)    BP Readings from Last 3 Encounters:  02/01/21 120/86  08/22/20 130/72  08/15/20 115/64    Objective:  Physical Exam Constitutional:      Appearance: Normal appearance. She is obese.  HENT:     Head: Normocephalic and atraumatic.  Cardiovascular:     Rate and Rhythm: Normal rate and regular rhythm.     Pulses: Normal pulses.     Heart sounds: Normal heart sounds. No murmur heard. Pulmonary:     Effort: Pulmonary effort is normal. No respiratory distress.      Breath sounds: Normal breath sounds. No wheezing.  Musculoskeletal:        General: Swelling present.  Skin:    General: Skin is warm and dry.     Capillary Refill: Capillary refill takes less than 2 seconds.     Findings: Rash present. Rash is scaling.     Comments: Patient has rash on her left upper extremity.   Neurological:     Mental Status: She is alert and oriented to person, place, and time.        Assessment And Plan:     1. Rash - predniSONE (STERAPRED UNI-PAK 21 TAB) 10 MG (21) TBPK tablet; Use as directed  Dispense: 21 tablet; Refill: 0  2. Weakness Will check blood count.  - CMP14+EGFR - CBC with Diff - Iron, TIBC and Ferritin Panel - Vitamin B12 - Magnesium - TSH + free T4 - Protein electrophoresis, serum  3. Flexural eczema - triamcinolone ointment (KENALOG) 0.5 %; Apply 1 application topically 2 (two) times daily.  Dispense: 30 g; Refill: 2 -Will consider referral to dermatologist.   4. Numbness - Hemoglobin A1c - Protein electrophoresis, serum   The patient was encouraged to call or send a message through [facility] for any questions or concerns.   Follow up: if symptoms persist or do not get better.   Side effects and appropriate use of all the medication(s) were discussed with the patient today. Patient advised to use the medication(s) as directed by their healthcare provider. The patient was encouraged to read, review, and understand all associated package inserts and contact our office with any questions or concerns. The patient accepts the risks of the treatment plan and had an opportunity to ask questions.   Staying healthy and adopting a healthy lifestyle for your overall health is important. You should eat 7 or more servings of fruits and vegetables per day. You should drink plenty of water to keep yourself hydrated and your kidneys healthy. This includes about 65-80+ fluid ounces of water. Limit your intake of animal fats especially for elevated  cholesterol. Avoid highly processed food and limit your salt intake if you have hypertension. Avoid foods high in saturated/Trans fats. Along with a healthy diet it is also very important to maintain time for yourself to maintain a healthy mental health with low stress levels. You should get atleast 150 min of moderate intensity exercise weekly for a healthy heart. Along with eating right and exercising, aim for at least 7-9 hours of sleep daily.  Eat more whole grains which includes barley, wheat berries, oats, brown rice and whole wheat  pasta. Use healthy plant oils which include olive, soy, corn, sunflower and peanut. Limit your caffeine and sugary drinks. Limit your intake of fast foods. Limit milk and dairy products to one or two daily servings.   Patient was given opportunity to ask questions. Patient verbalized understanding of the plan and was able to repeat key elements of the plan. All questions were answered to their satisfaction.  Raman Anneka Studer, DNP   I, Raman Nikaya Nasby have reviewed all documentation for this visit. The documentation on 02/01/21 for the exam, diagnosis, procedures, and orders are all accurate and complete.      IF YOU HAVE BEEN REFERRED TO A SPECIALIST, IT MAY TAKE 1-2 WEEKS TO SCHEDULE/PROCESS THE REFERRAL. IF YOU HAVE NOT HEARD FROM US/SPECIALIST IN TWO WEEKS, PLEASE GIVE [place] A CALL AT [phone].   THE PATIENT IS ENCOURAGED TO PRACTICE SOCIAL DISTANCING DUE TO THE COVID-19 PANDEMIC.

## 2021-02-01 NOTE — Patient Instructions (Signed)
Rash, Adult  A rash is a change in the color of your skin. A rash can also change the way your skin feels. There are many different conditions and factors that can causea rash. Follow these instructions at home: The goal of treatment is to stop the itching and keep the rash from spreading. Watch for any changes in your symptoms. Let your doctor know about them. Followthese instructions to help with your condition: Medicine Take or apply over-the-counter and prescription medicines only as told by your doctor. These may include medicines: To treat red or swollen skin (corticosteroid creams). To treat itching. To treat an allergy (oral antihistamines). To treat very bad symptoms (oral corticosteroids).  Skin care Put cool cloths (compresses) on the affected areas. Do not scratch or rub your skin. Avoid covering the rash. Make sure that the rash is exposed to air as much as possible. Managing itching and discomfort Avoid hot showers or baths. These can make itching worse. A cold shower may help. Try taking a bath with: Epsom salts. You can get these at your local pharmacy or grocery store. Follow the instructions on the package. Baking soda. Pour a small amount into the bath as told by your doctor. Colloidal oatmeal. You can get this at your local pharmacy or grocery store. Follow the instructions on the package. Try putting baking soda paste onto your skin. Stir water into baking soda until it gets like a paste. Try putting on a lotion that relieves itchiness (calamine lotion). Keep cool and out of the sun. Sweating and being hot can make itching worse. General instructions  Rest as needed. Drink enough fluid to keep your pee (urine) pale yellow. Wear loose-fitting clothing. Avoid scented soaps, detergents, and perfumes. Use gentle soaps, detergents, perfumes, and other cosmetic products. Avoid anything that causes your rash. Keep a journal to help track what causes your rash. Write  down: What you eat. What cosmetic products you use. What you drink. What you wear. This includes jewelry. Keep all follow-up visits as told by your doctor. This is important.  Contact a doctor if: You sweat at night. You lose weight. You pee (urinate) more than normal. You pee less than normal, or you notice that your pee is a darker color than normal. You feel weak. You throw up (vomit). Your skin or the whites of your eyes look yellow (jaundice). Your skin: Tingles. Is numb. Your rash: Does not go away after a few days. Gets worse. You are: More thirsty than normal. More tired than normal. You have: New symptoms. Pain in your belly (abdomen). A fever. Watery poop (diarrhea). Get help right away if: You have a fever and your symptoms suddenly get worse. You start to feel mixed up (confused). You have a very bad headache or a stiff neck. You have very bad joint pains or stiffness. You have jerky movements that you cannot control (seizure). Your rash covers all or most of your body. The rash may or may not be painful. You have blisters that: Are on top of the rash. Grow larger. Grow together. Are painful. Are inside your nose or mouth. You have a rash that: Looks like purple pinprick-sized spots all over your body. Has a "bull's eye" or looks like a target. Is red and painful, causes your skin to peel, and is not from being in the sun too long. Summary A rash is a change in the color of your skin. A rash can also change the way your skin feels.   The goal of treatment is to stop the itching and keep the rash from spreading. Take or apply over-the-counter and prescription medicines only as told by your doctor. Contact a doctor if you have new symptoms or symptoms that get worse. Keep all follow-up visits as told by your doctor. This is important. This information is not intended to replace advice given to you by your health care provider. Make sure you discuss any  questions you have with your healthcare provider. Document Revised: 10/09/2018 Document Reviewed: 01/19/2018 Elsevier Patient Education  2022 [employer]  

## 2021-02-02 LAB — CBC WITH DIFFERENTIAL/PLATELET
Basophils Absolute: 0.1 10*3/uL (ref 0.0–0.2)
Basos: 1 %
EOS (ABSOLUTE): 0.4 10*3/uL (ref 0.0–0.4)
Eos: 9 %
Hematocrit: 35.8 % (ref 34.0–46.6)
Hemoglobin: 12.1 g/dL (ref 11.1–15.9)
Immature Grans (Abs): 0 10*3/uL (ref 0.0–0.1)
Immature Granulocytes: 0 %
Lymphocytes Absolute: 0.9 10*3/uL (ref 0.7–3.1)
Lymphs: 17 %
MCH: 33 pg (ref 26.6–33.0)
MCHC: 33.8 g/dL (ref 31.5–35.7)
MCV: 98 fL — ABNORMAL HIGH (ref 79–97)
Monocytes Absolute: 0.5 10*3/uL (ref 0.1–0.9)
Monocytes: 9 %
Neutrophils Absolute: 3.3 10*3/uL (ref 1.4–7.0)
Neutrophils: 64 %
Platelets: 212 10*3/uL (ref 150–450)
RBC: 3.67 x10E6/uL — ABNORMAL LOW (ref 3.77–5.28)
RDW: 14.8 % (ref 11.7–15.4)
WBC: 5.2 10*3/uL (ref 3.4–10.8)

## 2021-02-02 LAB — CMP14+EGFR
ALT: 40 IU/L — ABNORMAL HIGH (ref 0–32)
AST: 53 IU/L — ABNORMAL HIGH (ref 0–40)
Albumin/Globulin Ratio: 0.8 — ABNORMAL LOW (ref 1.2–2.2)
Albumin: 2.4 g/dL — ABNORMAL LOW (ref 3.8–4.8)
Alkaline Phosphatase: 139 IU/L — ABNORMAL HIGH (ref 44–121)
BUN/Creatinine Ratio: 12 (ref 12–28)
BUN: 10 mg/dL (ref 8–27)
Bilirubin Total: 0.7 mg/dL (ref 0.0–1.2)
CO2: 25 mmol/L (ref 20–29)
Calcium: 7.8 mg/dL — ABNORMAL LOW (ref 8.7–10.3)
Chloride: 97 mmol/L (ref 96–106)
Creatinine, Ser: 0.81 mg/dL (ref 0.57–1.00)
Globulin, Total: 2.9 g/dL (ref 1.5–4.5)
Glucose: 83 mg/dL (ref 65–99)
Potassium: 3.1 mmol/L — ABNORMAL LOW (ref 3.5–5.2)
Sodium: 139 mmol/L (ref 134–144)
Total Protein: 5.3 g/dL — ABNORMAL LOW (ref 6.0–8.5)
eGFR: 80 mL/min/{1.73_m2} (ref >59)

## 2021-02-02 LAB — PROTEIN ELECTROPHORESIS, SERUM
A/G Ratio: 1.1 (ref 0.7–1.7)
Albumin ELP: 2.6 g/dL — ABNORMAL LOW (ref 2.9–4.4)
Alpha 1: 0.2 g/dL (ref 0.0–0.4)
Alpha 2: 0.5 g/dL (ref 0.4–1.0)
Beta: 0.6 g/dL — ABNORMAL LOW (ref 0.7–1.3)
Gamma Globulin: 1 g/dL (ref 0.4–1.8)
Globulin, Total: 2.3 g/dL (ref 2.2–3.9)
Total Protein: 4.9 g/dL — ABNORMAL LOW (ref 6.0–8.5)

## 2021-02-02 LAB — IRON,TIBC AND FERRITIN PANEL
Ferritin: 1450 ng/mL — ABNORMAL HIGH (ref 15–150)
Iron Saturation: >81 % (ref 15–55)
Iron: 72 ug/dL (ref 27–139)
Total Iron Binding Capacity: <89 ug/dL — CL (ref 250–450)
UIBC: <17 ug/dL — ABNORMAL LOW (ref 118–369)

## 2021-02-02 LAB — VITAMIN B12: Vitamin B-12: >2000 pg/mL — ABNORMAL HIGH (ref 232–1245)

## 2021-02-02 LAB — TSH+FREE T4
Free T4: 1.23 ng/dL (ref 0.82–1.77)
TSH: 4.03 u[IU]/mL (ref 0.450–4.500)

## 2021-02-02 LAB — MAGNESIUM: Magnesium: 1.4 mg/dL — ABNORMAL LOW (ref 1.6–2.3)

## 2021-02-02 LAB — HEMOGLOBIN A1C
Est. average glucose Bld gHb Est-mCnc: 82 mg/dL
Hgb A1c MFr Bld: 4.5 % — ABNORMAL LOW (ref 4.8–5.6)

## 2021-02-08 ENCOUNTER — Other Ambulatory Visit: Payer: Self-pay | Admitting: Nurse Practitioner

## 2021-02-08 DIAGNOSIS — R7989 Other specified abnormal findings of blood chemistry: Secondary | ICD-10-CM

## 2021-02-08 DIAGNOSIS — R200 Anesthesia of skin: Secondary | ICD-10-CM

## 2021-02-08 DIAGNOSIS — E876 Hypokalemia: Secondary | ICD-10-CM

## 2021-02-08 MED ORDER — MAGNESIUM 400 MG PO CAPS: ORAL_CAPSULE | ORAL | 0 refills | Status: DC

## 2021-02-08 MED ORDER — POTASSIUM CHLORIDE CRYS ER 20 MEQ PO TBCR: 20.0000 meq | EXTENDED_RELEASE_TABLET | Freq: Every day | ORAL | 0 refills | Status: DC

## 2021-02-09 ENCOUNTER — Telehealth: Payer: Self-pay | Admitting: Oncology

## 2021-02-09 NOTE — Telephone Encounter (Signed)
Received a new hem referral from Dr. Baird Cancer for elevated ferritin. Debbie Barry has been cld and scheduled to see Dr. Jana Hakim on 8/16 at 430pm w/labs at 4pm. Pt aware to arrive 15 minutes early.

## 2021-02-13 ENCOUNTER — Inpatient Hospital Stay: Payer: Medicare HMO | Attending: Oncology | Admitting: Oncology

## 2021-02-13 ENCOUNTER — Inpatient Hospital Stay: Payer: Medicare HMO

## 2021-02-13 ENCOUNTER — Other Ambulatory Visit: Payer: Self-pay

## 2021-02-13 ENCOUNTER — Encounter: Payer: Self-pay | Admitting: Oncology

## 2021-02-13 ENCOUNTER — Telehealth: Payer: Self-pay

## 2021-02-13 DIAGNOSIS — R5383 Other fatigue: Secondary | ICD-10-CM

## 2021-02-13 DIAGNOSIS — R7989 Other specified abnormal findings of blood chemistry: Secondary | ICD-10-CM | POA: Diagnosis not present

## 2021-02-13 DIAGNOSIS — G629 Polyneuropathy, unspecified: Secondary | ICD-10-CM | POA: Insufficient documentation

## 2021-02-13 DIAGNOSIS — R634 Abnormal weight loss: Secondary | ICD-10-CM | POA: Insufficient documentation

## 2021-02-13 DIAGNOSIS — R296 Repeated falls: Secondary | ICD-10-CM | POA: Insufficient documentation

## 2021-02-13 LAB — CBC WITH DIFFERENTIAL/PLATELET
Abs Immature Granulocytes: 0.02 10*3/uL (ref 0.00–0.07)
Basophils Absolute: 0 10*3/uL (ref 0.0–0.1)
Basophils Relative: 0 %
Eosinophils Absolute: 0.2 10*3/uL (ref 0.0–0.5)
Eosinophils Relative: 4 %
HCT: 32.3 % — ABNORMAL LOW (ref 36.0–46.0)
Hemoglobin: 11.4 g/dL — ABNORMAL LOW (ref 12.0–15.0)
Immature Granulocytes: 0 %
Lymphocytes Relative: 25 %
Lymphs Abs: 1.4 10*3/uL (ref 0.7–4.0)
MCH: 32.9 pg (ref 26.0–34.0)
MCHC: 35.3 g/dL (ref 30.0–36.0)
MCV: 93.4 fL (ref 80.0–100.0)
Monocytes Absolute: 0.6 10*3/uL (ref 0.1–1.0)
Monocytes Relative: 11 %
Neutro Abs: 3.2 10*3/uL (ref 1.7–7.7)
Neutrophils Relative %: 60 %
Platelets: 192 10*3/uL (ref 150–400)
RBC: 3.46 MIL/uL — ABNORMAL LOW (ref 3.87–5.11)
RDW: 18 % — ABNORMAL HIGH (ref 11.5–15.5)
WBC: 5.4 10*3/uL (ref 4.0–10.5)
nRBC: 0 % (ref 0.0–0.2)

## 2021-02-13 LAB — SEDIMENTATION RATE: Sed Rate: 1 mm/hr (ref 0–22)

## 2021-02-13 LAB — COMPREHENSIVE METABOLIC PANEL
ALT: 46 U/L — ABNORMAL HIGH (ref 0–44)
AST: 49 U/L — ABNORMAL HIGH (ref 15–41)
Albumin: 2.4 g/dL — ABNORMAL LOW (ref 3.5–5.0)
Alkaline Phosphatase: 126 U/L (ref 38–126)
Anion gap: 11 (ref 5–15)
BUN: 11 mg/dL (ref 8–23)
CO2: 30 mmol/L (ref 22–32)
Calcium: 8.3 mg/dL — ABNORMAL LOW (ref 8.9–10.3)
Chloride: 102 mmol/L (ref 98–111)
Creatinine, Ser: 0.66 mg/dL (ref 0.44–1.00)
GFR, Estimated: >60 mL/min (ref >60)
Glucose, Bld: 78 mg/dL (ref 70–99)
Potassium: 2.8 mmol/L — ABNORMAL LOW (ref 3.5–5.1)
Sodium: 143 mmol/L (ref 135–145)
Total Bilirubin: 0.8 mg/dL (ref 0.3–1.2)
Total Protein: 5.4 g/dL — ABNORMAL LOW (ref 6.5–8.1)

## 2021-02-13 LAB — SAVE SMEAR(SSMR), FOR PROVIDER SLIDE REVIEW

## 2021-02-13 NOTE — Telephone Encounter (Signed)
I left the pt a message to call the office back or respond to the questions that was in her mychart that she read with her lab results.

## 2021-02-13 NOTE — Telephone Encounter (Signed)
-----   Message from Bary Castilla, NP sent at 02/08/2021  8:42 AM EDT ----- Your total protein is low along with several other labs. Your potassium is also low at 3.1. I will send you a prescription for you to take. Your calcium is low. Your liver enzymes are elevated. Are you drinking alcohol? If so how much are you drinking? Please follow up with your GI and make an appt with them.  If you need a new referral please let me know. Your hemoglobin is stable however your iron binding capacity is very low, iron saturation is high and ferratin is is up. I am sending you a referral to see a hematologist for further evaluation. Your Hgb A1c is low. Are you incorporating carbs in your diet? Vitamin B12 is also elevated. Are you taking any vitamin B-12 OTC? This may also signify your liver is not working properly. Please make a follow up appt with your GI. Magnesium is low at 1.4 I will send a prescription for you to take magnesium 400 mg once daily in the evenings.  Thyroid level is normal.

## 2021-02-13 NOTE — Progress Notes (Signed)
[place]  [phone] [phone]     ID: Crescent Gotham DOB: 01/26/1951  MR#: [id number]  SPQ#:[id number]  Patient Care Team: Glendale Chard, MD as PCP - General (Internal Medicine) Chauncey Cruel, MD OTHER MD:  CHIEF COMPLAINT: Evaluate for iron overload  CURRENT TREATMENT: Consider phlebotomy   HISTORY OF CURRENT ILLNESS: Debbie Barry is a 70 year old [place] woman referred by primary care for evaluation of high iron levels.  The patient was seen by Dr. Larena Glassman nurse practitioner 02/01/2021 with complaint of generalized weakness, numbness and a rash.  She was treated with steroids.  Extensive lab work was obtained, including a protein electrophoresis which showed no M spike, but total protein 4.9, albumin 2.6, globulin 1.0.  C-Met showed a normal creatinine and GFR, but low potassium, low magnesium, and elevated alkaline phosphatase and liver function tests.  White cells were 5.2, hemoglobin 12.1, MCV 98, and platelets 212,000.  Hemoglobin A1c was 4.5 vitamin B12 was elevated.  Thyroid studies were normal.  However the ferritin was elevated at 1450, and the iron saturation was 81%.  The patient was referred for further evaluation of her apparent iron overload.  Of note, iron saturation on 03/14/2020 was 78%.  In addition, she had an admission February 2022 with hematemesis and anemia.  She underwent EGD which showed a nonbleeding ulcer.  She was treated for H. pylori.  This should have decreased the iron saturation but the opposite was the case.   The patient's subsequent history is as detailed below.  INTERVAL HISTORY: Debbie Barry was evaluated in the hematology clinic on 02/13/2021 accompanied by her husband Will.  REVIEW OF SYSTEMS: The patient tells me she never recovered from the admission in February 2022.  Her hemoglobin improved but she never felt any better.  She remains very tired.  She feels dizzy frequently.  She falls several  times a week despite using a cane and occasionally a walker.  She has decreased sensation in both feet and also has to except lesser extent in her hands.  She has lost 20 pounds in the last 6 months without trying to.  She has not had any problems with nausea or vomiting, taste alteration, or change in bowel or bladder habits.  A detailed review of systems was otherwise stable.  COVID: Status post [employer], no booster as of August 2022  PAST MEDICAL HISTORY: Past Medical History:  Diagnosis Date   Fatty liver    GERD (gastroesophageal reflux disease)    Hypertension    Osteomyelitis ([place])    right third toe   Peripheral vascular disease ([place])    Ulcer 08/14/20    PAST SURGICAL HISTORY: Past Surgical History:  Procedure Laterality Date   AMPUTATION Right 08/08/2017   Procedure: AMPUTATION RIGHT 3RD TOE;  Surgeon: Newt Minion, MD;  Location: [facility];  Service: Orthopedics;  Laterality: Right;   BIOPSY  08/14/2020   Procedure: BIOPSY;  Surgeon: Clarene Essex, MD;  Location: [facility];  Service: Endoscopy;;   Bunionectomy Right 2006     Right   Bunionectony Left 2006      August   COLONOSCOPY N/A 01/21/2014   Procedure: COLONOSCOPY;  Surgeon: Beryle Beams, MD;  Location: [facility];  Service: Endoscopy;  Laterality: N/A;   ESOPHAGOGASTRODUODENOSCOPY (EGD) WITH PROPOFOL N/A 08/14/2020   Procedure: ESOPHAGOGASTRODUODENOSCOPY (EGD) WITH PROPOFOL;  Surgeon: Clarene Essex, MD;  Location: [facility];  Service: Endoscopy;  Laterality: N/A;   TUBAL LIGATION     WISDOM  TOOTH EXTRACTION      FAMILY HISTORY Family History  Problem Relation Age of Onset   Breast cancer Mother    Colon cancer Mother    Heart Problems Mother    Hypertension Mother    Multiple myeloma Mother    Arthritis Mother    Cancer Mother    Heart disease Mother    Hypertension Father    Heart disease Father    Multiple myeloma Father    Arthritis Father    Cancer Father   The patient's father died from  complications of multiple myeloma at age 38.  He had 1 brother who died in his 60s from unknown causes.  The patient has no further information on that side of the family.  The patient's mother died with colon cancer at age 34.  One maternal cousin had lupus.  There is no history of hemochromatosis in the family as far as the patient knows.  She is an only child  GYNECOLOGIC HISTORY:  No LMP recorded. Patient is postmenopausal. Menarche: 70 years old Age at first live birth: 70 years old [place] P 2 LMP 60 Hysterectomy?  No Salpingo-oophorectomy?  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  Her husband works in Land at a child development center.  At home is generally just the 2 of them plus their border collie.  Daughter [name] and is a homemaker.  She has 4 children.  Son [name] also lives in [place] and is a Event organiser.  He has 3 children.  In addition to the 7 grandchildren the patient has 1 great grandchild.  She is not a church attender    ADVANCED DIRECTIVES:    HEALTH MAINTENANCE: Social History   Tobacco Use   Smoking status: Never   Smokeless tobacco: Never  Vaping Use   Vaping Use: Never used  Substance Use Topics   Alcohol use: Yes    Alcohol/week: 5.0 standard drinks    Types: 5 Glasses of wine per week    Comment: social glass of wine   Drug use: No     Colonoscopy:  PAP:  Bone density:   No Known Allergies  Current Outpatient Medications  Medication Sig Dispense Refill   amLODipine (NORVASC) 5 MG tablet Take 1 tablet (5 mg total) by mouth daily. 90 tablet 1   Cholecalciferol (VITAMIN D3) 5000 units CAPS Take 5,000 Units by mouth daily.     Magnesium 400 MG CAPS Take 1 capsule by mouth daily in the evening. 30 capsule 0   Multiple Vitamin (MULTIVITAMIN WITH MINERALS) TABS tablet Take 1 tablet by mouth daily.     pantoprazole (PROTONIX) 40 MG tablet Take 1 tablet (40 mg total) by mouth 2  (two) times daily before a meal. Take $RemoveBe'40mg'NbecLxqTO$  twice daily for 2 weeks, then decrease to $RemoveBef'40mg'evMFpFKWXR$  once daily 60 tablet 0   potassium chloride SA (KLOR-CON) 20 MEQ tablet Take 1 tablet (20 mEq total) by mouth daily. 5 tablet 0   predniSONE (STERAPRED UNI-PAK 21 TAB) 10 MG (21) TBPK tablet Use as directed 21 tablet 0   sertraline (ZOLOFT) 25 MG tablet Take 1 tablet (25 mg total) by mouth daily. 90 tablet 1   Tetrahydrozoline HCl (VISINE OP) Place 2 drops into both eyes daily as needed (for dry/irritated eyes).      triamcinolone ointment (KENALOG) 0.5 % Apply 1 application topically 2 (two) times daily. 30 g 2   acetaminophen (TYLENOL) 650  MG CR tablet Take 650 mg by mouth every 8 (eight) hours as needed for pain.     Cyanocobalamin (B-12 PO) Take by mouth. (Patient not taking: Reported on 02/13/2021)     No current facility-administered medications for this visit.    OBJECTIVE: African-American woman examined in a wheelchair  Vitals:   02/13/21 1648  BP: (!) 141/83  Pulse: 93  Resp: 18  Temp: 98 F (36.7 C)  SpO2: 100%     Body mass index is 23.26 kg/m.   Wt Readings from Last 3 Encounters:  02/13/21 123 lb 1.6 oz (55.8 kg)  08/22/20 141 lb 12.8 oz (64.3 kg)  08/14/20 141 lb (64 kg)      ECOG FS:2 - Symptomatic, <50% confined to bed  Ocular: Sclerae unicteric, EOMs intact Lymphatic: No cervical or supraclavicular adenopathy Lungs no rales or rhonchi Heart regular rate and rhythm Abd soft, nontender, no liver edge palpated MSK no focal spinal tenderness, bilateral ankle edema Neuro: well-oriented, appropriate affect, 4/5 dorsiflexion bilaterally, with decreased sensation in the lower extremities Breasts: Deferred   LAB RESULTS:  CMP     Component Value Date/Time   NA 143 02/13/2021 1618   NA 139 02/01/2021 1509   K 2.8 (L) 02/13/2021 1618   CL 102 02/13/2021 1618   CO2 30 02/13/2021 1618   GLUCOSE 78 02/13/2021 1618   BUN 11 02/13/2021 1618   BUN 10 02/01/2021 1509    CREATININE 0.66 02/13/2021 1618   CALCIUM 8.3 (L) 02/13/2021 1618   PROT 5.4 (L) 02/13/2021 1618   PROT 4.9 (L) 02/01/2021 1620   ALBUMIN 2.4 (L) 02/13/2021 1618   ALBUMIN 2.4 (L) 02/01/2021 1509   AST 49 (H) 02/13/2021 1618   ALT 46 (H) 02/13/2021 1618   ALKPHOS 126 02/13/2021 1618   BILITOT 0.8 02/13/2021 1618   BILITOT 0.7 02/01/2021 1509   GFRNONAA >60 02/13/2021 1618   GFRAA 105 08/22/2020 1604    Lab Results  Component Value Date   ALBUMINELP 2.6 (L) 02/01/2021   MSPIKE Not Observed 02/01/2021    No results found for: KPAFRELGTCHN, LAMBDASER, [facility]  Lab Results  Component Value Date   WBC 5.4 02/13/2021   NEUTROABS 3.2 02/13/2021   HGB 11.4 (L) 02/13/2021   HCT 32.3 (L) 02/13/2021   MCV 93.4 02/13/2021   PLT 192 02/13/2021      Chemistry      Component Value Date/Time   NA 143 02/13/2021 1618   NA 139 02/01/2021 1509   K 2.8 (L) 02/13/2021 1618   CL 102 02/13/2021 1618   CO2 30 02/13/2021 1618   BUN 11 02/13/2021 1618   BUN 10 02/01/2021 1509   CREATININE 0.66 02/13/2021 1618      Component Value Date/Time   CALCIUM 8.3 (L) 02/13/2021 1618   ALKPHOS 126 02/13/2021 1618   AST 49 (H) 02/13/2021 1618   ALT 46 (H) 02/13/2021 1618   BILITOT 0.8 02/13/2021 1618   BILITOT 0.7 02/01/2021 1509       No results found for: LABCA2  No components found for: MEBRAX094  No results for input(s): INR in the last 168 hours.  No results found for: LABCA2  No results found for: MHW808  No results found for: UPJ031  No results found for: RXY585  No results found for: CA2729  No components found for: HGQUANT  No results found for: CEA1 / No results found for: CEA1   No results found for: AFPTUMOR  No results found for:  Downs  No results found for: PSA1  Appointment on 02/13/2021  Component Date Value Ref Range Status   Smear Review 02/13/2021 SMEAR STAINED AND AVAILABLE FOR REVIEW   Final   Performed at [facility], [address], [place], [place] [address]   Sodium 02/13/2021 143  135 - 145 mmol/L Final   Potassium 02/13/2021 2.8 (A) 3.5 - 5.1 mmol/L Final   Chloride 02/13/2021 102  98 - 111 mmol/L Final   CO2 02/13/2021 30  22 - 32 mmol/L Final   Glucose, Bld 02/13/2021 78  70 - 99 mg/dL Final   Glucose reference range applies only to samples taken after fasting for at least 8 hours.   BUN 02/13/2021 11  8 - 23 mg/dL Final   Creatinine, Ser 02/13/2021 0.66  0.44 - 1.00 mg/dL Final   Calcium 02/13/2021 8.3 (A) 8.9 - 10.3 mg/dL Final   Total Protein 02/13/2021 5.4 (A) 6.5 - 8.1 g/dL Final   Albumin 02/13/2021 2.4 (A) 3.5 - 5.0 g/dL Final   AST 02/13/2021 49 (A) 15 - 41 U/L Final   ALT 02/13/2021 46 (A) 0 - 44 U/L Final   Alkaline Phosphatase 02/13/2021 126  38 - 126 U/L Final   Total Bilirubin 02/13/2021 0.8  0.3 - 1.2 mg/dL Final   GFR, Estimated 02/13/2021 >60  >60 mL/min Final   Comment: (NOTE) Calculated using the CKD-EPI Creatinine Equation (2021)    Anion gap 02/13/2021 11  5 - 15 Final   Performed at [facility], [place] [address], [place], [place] [address]   WBC 02/13/2021 5.4  4.0 - 10.5 K/uL Final   RBC 02/13/2021 3.46 (A) 3.87 - 5.11 MIL/uL Final   Hemoglobin 02/13/2021 11.4 (A) 12.0 - 15.0 g/dL Final   HCT 02/13/2021 32.3 (A) 36.0 - 46.0 % Final   MCV 02/13/2021 93.4  80.0 - 100.0 fL Final   MCH 02/13/2021 32.9  26.0 - 34.0 pg Final   MCHC 02/13/2021 35.3  30.0 - 36.0 g/dL Final   RDW 02/13/2021 18.0 (A) 11.5 - 15.5 % Final   Platelets 02/13/2021 192  150 - 400 K/uL Final   nRBC 02/13/2021 0.0  0.0 - 0.2 % Final   Neutrophils Relative % 02/13/2021 60  % Final   Neutro Abs 02/13/2021 3.2  1.7 - 7.7 K/uL Final   Lymphocytes Relative 02/13/2021 25  % Final   Lymphs Abs 02/13/2021 1.4  0.7 - 4.0 K/uL Final   Monocytes Relative 02/13/2021 11  % Final   Monocytes Absolute 02/13/2021 0.6  0.1 - 1.0 K/uL Final   Eosinophils Relative 02/13/2021 4  %  Final   Eosinophils Absolute 02/13/2021 0.2  0.0 - 0.5 K/uL Final   Basophils Relative 02/13/2021 0  % Final   Basophils Absolute 02/13/2021 0.0  0.0 - 0.1 K/uL Final   Immature Granulocytes 02/13/2021 0  % Final   Abs Immature Granulocytes 02/13/2021 0.02  0.00 - 0.07 K/uL Final   Performed at [facility], [place] [address], [place], [place] [address]    (this displays the last labs from the last 3 days)  Lab Results  Component Value Date   ALBUMINELP 2.6 (L) 02/01/2021   MSPIKE Not Observed 02/01/2021   (this displays SPEP labs)  No results found for: KPAFRELGTCHN, LAMBDASER, KAPLAMBRATIO (kappa/lambda light chains)  No results found for: HGBA, HGBA2QUANT, HGBFQUANT, HGBSQUAN (Hemoglobinopathy evaluation)   No results found for: LDH  Lab Results  Component Value Date   IRON 72 02/01/2021   TIBC <89 (LL) 02/01/2021   IRONPCTSAT >81 (HH) 02/01/2021   (Iron and TIBC)  Lab Results  Component Value Date   FERRITIN 1,450 (H) 02/01/2021    Urinalysis    Component Value Date/Time   COLORURINE YELLOW 08/12/2020 [place] 08/12/2020 1223   LABSPEC 1.020 08/12/2020 1223   PHURINE 7.0 08/12/2020 1223   GLUCOSEU NEGATIVE 08/12/2020 1223   HGBUR NEGATIVE 08/12/2020 [place] 08/12/2020 1223   BILIRUBINUR negative 03/30/2020 1215   KETONESUR 80 (A) 08/12/2020 1223   PROTEINUR NEGATIVE 08/12/2020 1223   UROBILINOGEN 0.2 03/30/2020 1215   NITRITE NEGATIVE 08/12/2020 1223   LEUKOCYTESUR NEGATIVE 08/12/2020 1223     STUDIES: No results found.  ELIGIBLE FOR AVAILABLE RESEARCH PROTOCOL: no  ASSESSMENT: 70 y.o. [place] woman presenting with fatigue, elevated liver function tests and very elevated ferritin and iron saturation, persistent on repeat  (1) genetics testing for hemochromatosis  (2) evaluate hepatic and cardiac iron stores  (3) consider phlebotomy  PLAN: I reviewed the overall situation with  normal.  She thought that she had low iron and that was the reason she was feeling weak.  We went over her lab work which shows that she is actually iron overloaded.  She may well have hemochromatosis and the appropriate lab work was sent today to confirm that.  We reviewed iron metabolism and she understands that in most people when the body has sufficient iron the body stops absorbing iron.  In people with hemochromatosis, the body continues to absorb iron to the point that iron accumulates in organs like the liver and heart and nerves and can produce potentially many of her findings for example the elevated liver function tests, the low albumin, the neuropathy, and perhaps the fatigue if we can document cardiac involvement.  I would like to obtain an MRI of the liver with special attention to iron stores and also a cardiac MRI but before proceeding with that I would like to confirm the diagnosis of hemochromatosis genetically.  She does have multiple falls and I am going to obtain a plain film of the head just to make sure there is no bleed and also a plain film of the lumbar spine where she was told some years ago she had some degenerative disease.  I also think a neurologic evaluation is in order and I have placed that request.  Normal understands if we do document hemochromatosis the standard of care is phlebotomy and we generally like to phlebotomize to a ferritin of less than 50.  If we need to mobilize iron stores from the liver or heart we may want to do other treatment in addition and we will discuss that once we have all the details on hand.  Jennilee has a good understanding of the overall plan. She agrees with it. She will call with any problems that may develop before her next visit here.  Total encounter time 65 minutes.Chauncey Cruel, MD   02/13/2021 5:45 PM Medical Oncology and Hematology [facility] [address] [place], [place] [address] Tel. [phone]     Fax. [phone]

## 2021-02-14 ENCOUNTER — Telehealth: Payer: Self-pay | Admitting: Oncology

## 2021-02-14 ENCOUNTER — Telehealth: Payer: Self-pay | Admitting: *Deleted

## 2021-02-14 ENCOUNTER — Other Ambulatory Visit: Payer: Self-pay | Admitting: Oncology

## 2021-02-14 DIAGNOSIS — E876 Hypokalemia: Secondary | ICD-10-CM

## 2021-02-14 MED ORDER — POTASSIUM CHLORIDE CRYS ER 20 MEQ PO TBCR
20.0000 meq | EXTENDED_RELEASE_TABLET | Freq: Two times a day (BID) | ORAL | 3 refills | Status: DC
Start: 2021-02-14 — End: 2021-05-22

## 2021-02-14 MED ORDER — POTASSIUM CHLORIDE CRYS ER 20 MEQ PO TBCR: 20.0000 meq | EXTENDED_RELEASE_TABLET | Freq: Two times a day (BID) | ORAL | 0 refills | Status: DC

## 2021-02-14 NOTE — Telephone Encounter (Signed)
This RN contacted the patient per potassium reported per lab of 2.8 to verify current use of potassium. Davyne states she is using over the counter potassium.  This RN explained to her OTC potassium is not best for replacement use and need for her to take the prescription Dr Jana Hakim sent in to her pharmacy.  Note due to pt prescription potassium naive recommendation clarified per MD for the pt to take 1 tab bid x 3 days then daily.  Pt verbalized understanding.

## 2021-02-14 NOTE — Telephone Encounter (Signed)
Scheduled appointment per 08/16 los. Left message.

## 2021-02-16 LAB — HEMOCHROMATOSIS DNA-PCR(C282Y,H63D)

## 2021-02-19 ENCOUNTER — Encounter: Payer: Self-pay | Admitting: Oncology

## 2021-02-20 ENCOUNTER — Other Ambulatory Visit: Payer: Self-pay

## 2021-02-20 DIAGNOSIS — R21 Rash and other nonspecific skin eruption: Secondary | ICD-10-CM

## 2021-02-21 ENCOUNTER — Encounter: Payer: Self-pay | Admitting: Neurology

## 2021-02-22 ENCOUNTER — Ambulatory Visit (HOSPITAL_COMMUNITY)
Admission: RE | Admit: 2021-02-22 | Discharge: 2021-02-22 | Disposition: A | Discharge status: Home / Self Care | Payer: Medicare HMO | Source: Ambulatory Visit | Attending: Oncology | Admitting: Oncology

## 2021-02-22 ENCOUNTER — Other Ambulatory Visit: Payer: Self-pay

## 2021-02-22 DIAGNOSIS — G629 Polyneuropathy, unspecified: Secondary | ICD-10-CM | POA: Diagnosis not present

## 2021-02-22 DIAGNOSIS — R634 Abnormal weight loss: Secondary | ICD-10-CM | POA: Diagnosis not present

## 2021-02-22 DIAGNOSIS — R7989 Other specified abnormal findings of blood chemistry: Secondary | ICD-10-CM | POA: Diagnosis not present

## 2021-02-22 DIAGNOSIS — R296 Repeated falls: Secondary | ICD-10-CM

## 2021-02-22 DIAGNOSIS — R42 Dizziness and giddiness: Secondary | ICD-10-CM | POA: Diagnosis not present

## 2021-02-22 DIAGNOSIS — M545 Low back pain, unspecified: Secondary | ICD-10-CM | POA: Diagnosis not present

## 2021-02-22 LAB — ANTINUCLEAR ANTIBODIES, IFA: ANA Ab, IFA: NEGATIVE

## 2021-02-22 IMAGING — CT CT HEAD W/O CM
4 series · 17 of 47 positions shown, 19 images · non-contrast
Comparison: None.

CLINICAL DATA: Dizziness.  Multiple falls with head trauma

EXAM:
CT HEAD WITHOUT CONTRAST
TECHNIQUE: Contiguous axial images were obtained from the base of the skull
through the vertex without intravenous contrast.

[Series 2: head wo · axial · 0.38mm/px · z∈[+1573,+1688]mm · 7 of 31 slices shown, 9 images]
[im 4/31  brain]
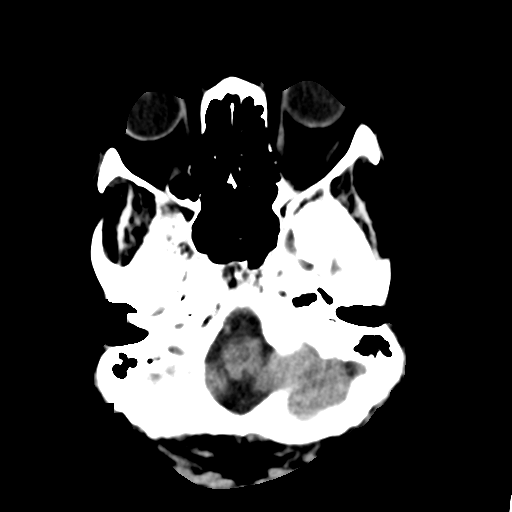
[im 4/31  bone]
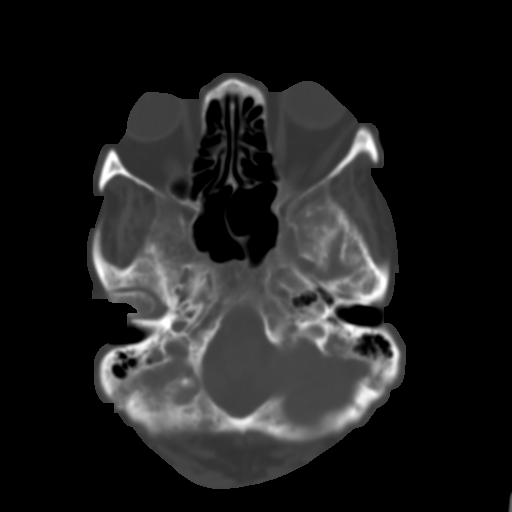
[im 8/31  brain]
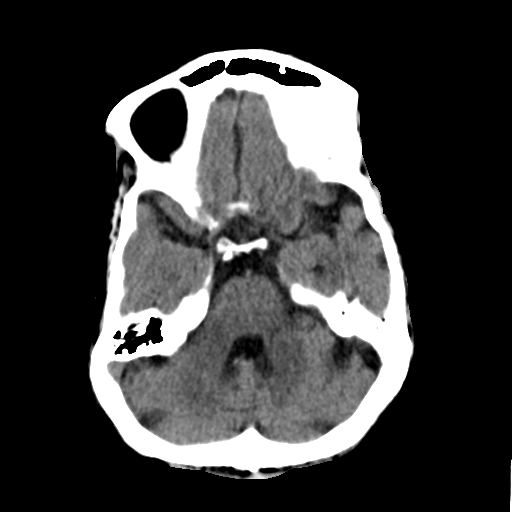
[im 12/31  brain]
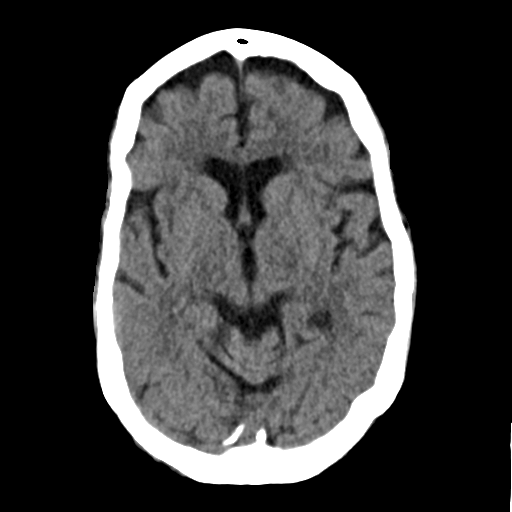
[im 16/31  brain]
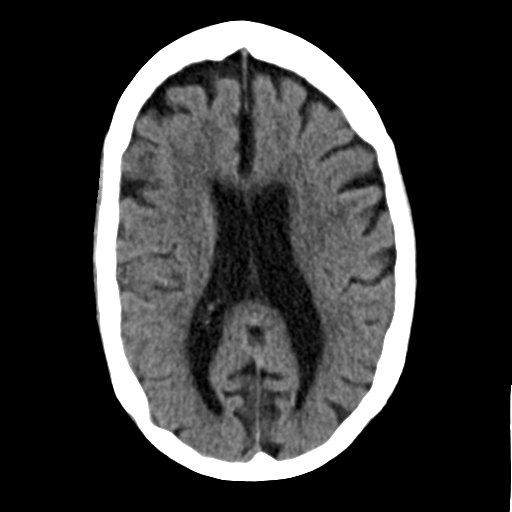
[im 19/31  brain]
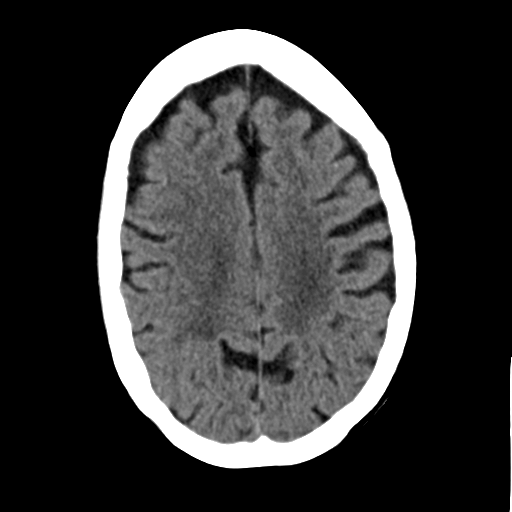
[im 19/31  bone]
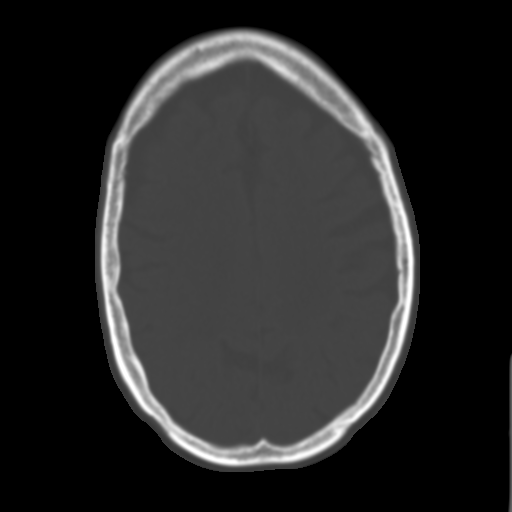
[im 23/31  brain]
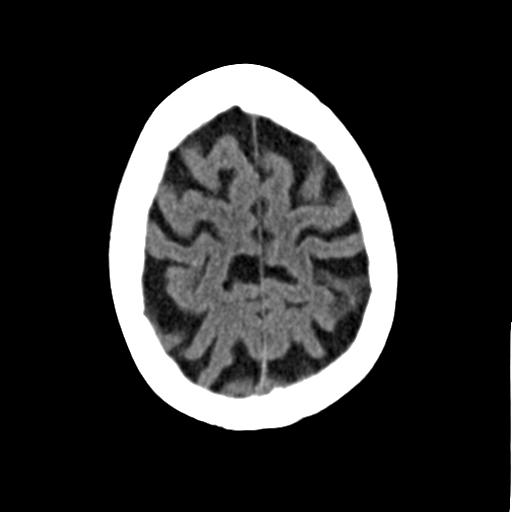
[im 27/31  brain]
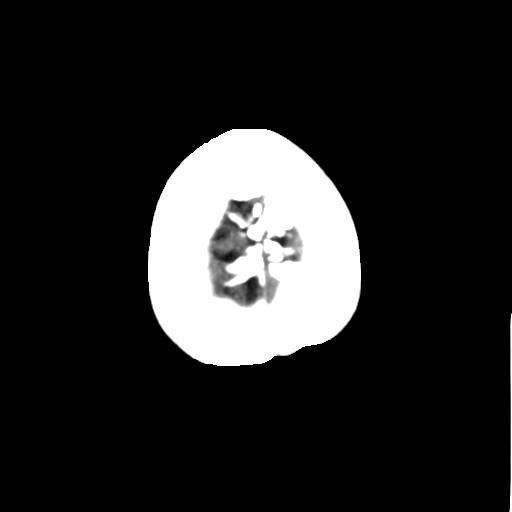

[Series 3: head bone · axial · 0.38mm/px · z∈[+1572,+1624]mm · 4 of 76 slices shown]
[im 8/76  bone]
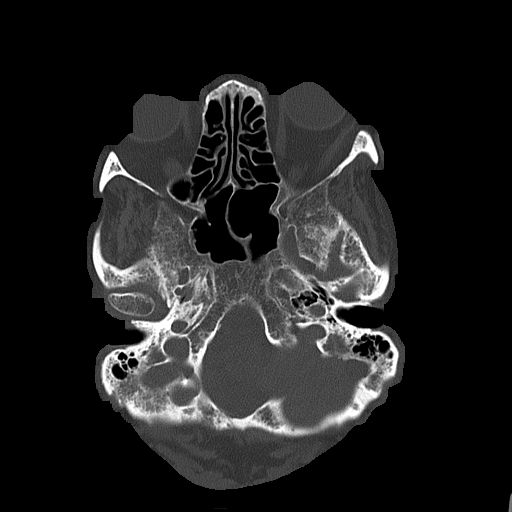
[im 16/76  bone]
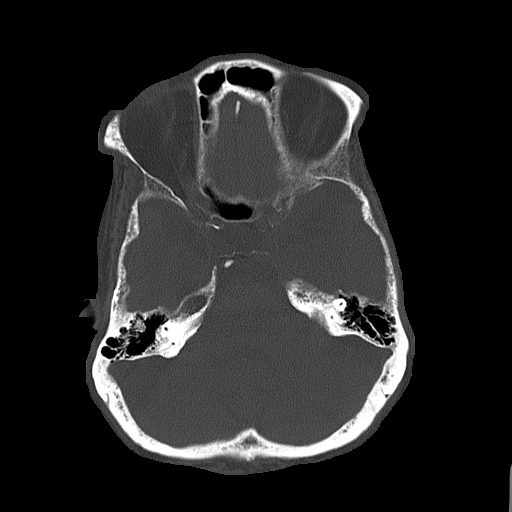
[im 23/76  bone]
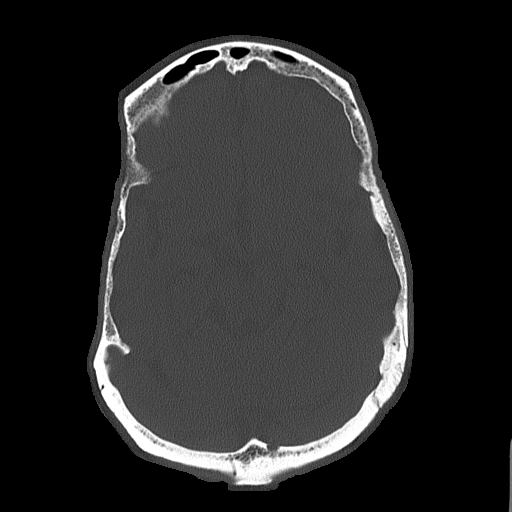
[im 34/76  bone]
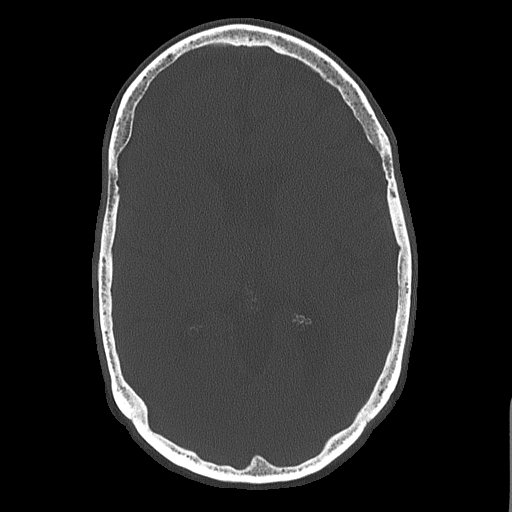

[Series 4: coronal soft tissue · coronal · 0.27mm/px · 3 of 62 slices shown]
[im 21/62  brain]
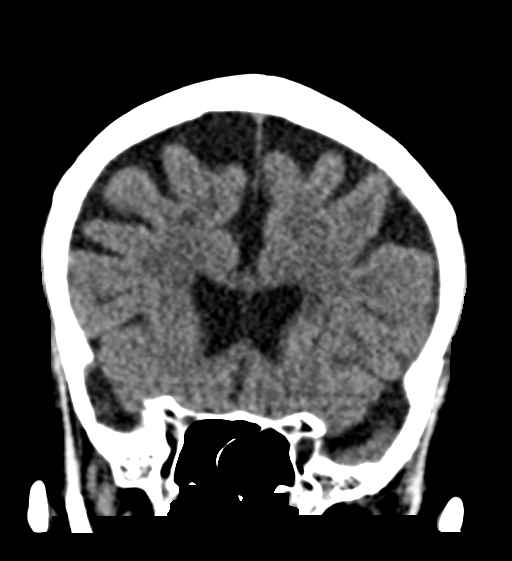
[im 28/62  brain]
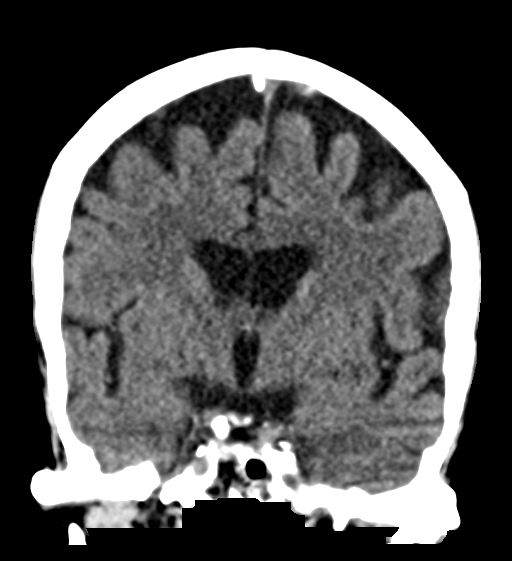
[im 34/62  brain]
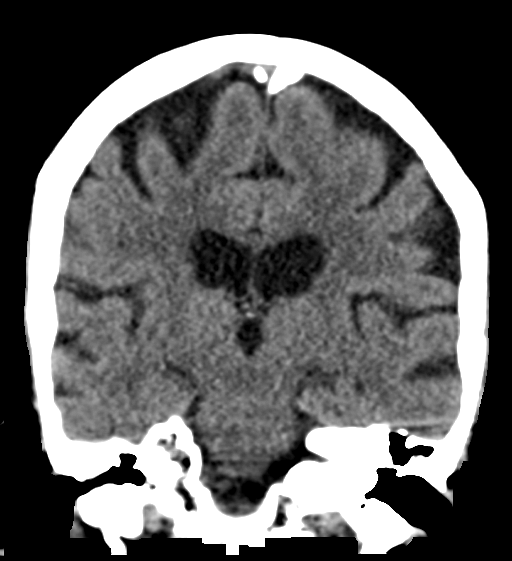

[Series 5: sagittal soft tissue · sagittal · 0.29mm/px · 3 of 44 slices shown]
[im 15/44  brain]
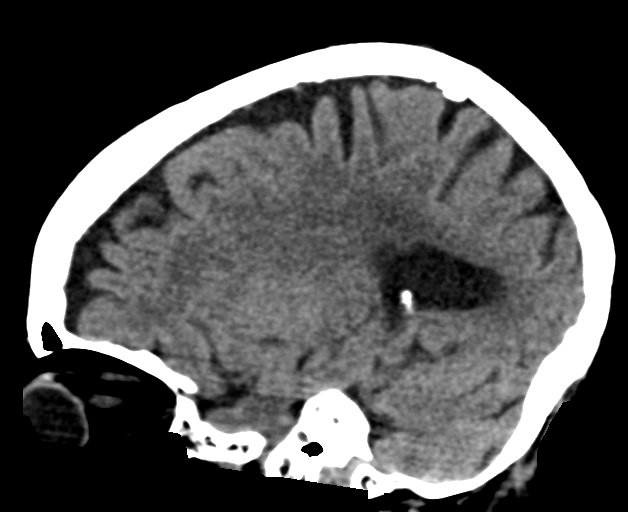
[im 22/44  brain]
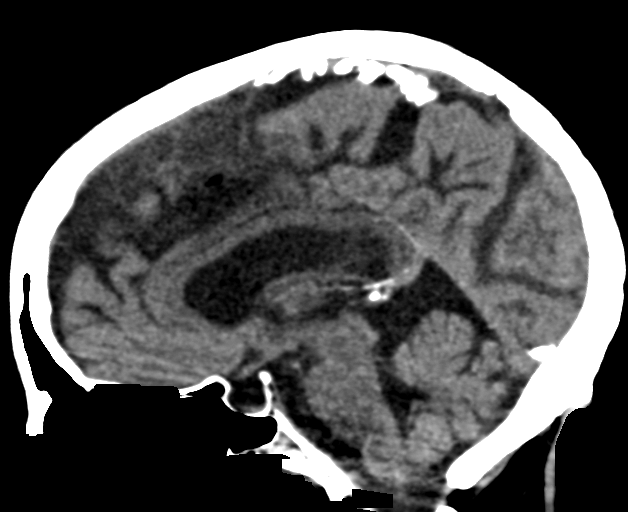
[im 29/44  brain]
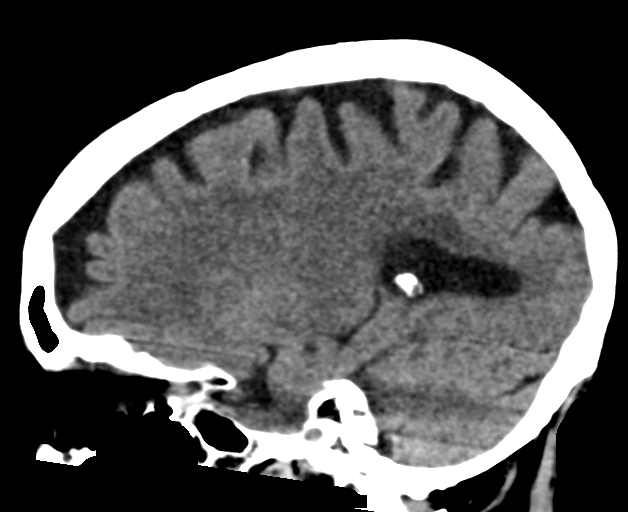

[17 of 47 positions shown; findings below may reference images not displayed]

FINDINGS: Brain: Generalized atrophy. Negative for hydrocephalus. Mild white
matter changes with patchy white matter hypodensity bilaterally.
Empty sella with mild enlargement of the sella, filled with CSF.

Negative for acute infarct, hemorrhage, mass.

Vascular: Negative for hyperdense vessel

Skull: Negative

Sinuses/Orbits: Negative

Other: None
IMPRESSION: No acute abnormality

Atrophy and chronic microvascular ischemic changes in the white
matter.

## 2021-02-22 IMAGING — CT CT L SPINE W/O CM
3 of 5 series · 12 of 33 positions shown, 13 images · non-contrast
Comparison: None.

CLINICAL DATA: Low back pain greater than 4 weeks. Hemochromatosis.

EXAM:
CT LUMBAR SPINE WITHOUT CONTRAST
TECHNIQUE: Multidetector CT imaging of the lumbar spine was performed without
intravenous contrast administration. Multiplanar CT image
reconstructions were also generated.

[Series 6: sagittal bone · sagittal · 0.27mm/px · 5 of 55 slices shown]
[im 10/55  bone]
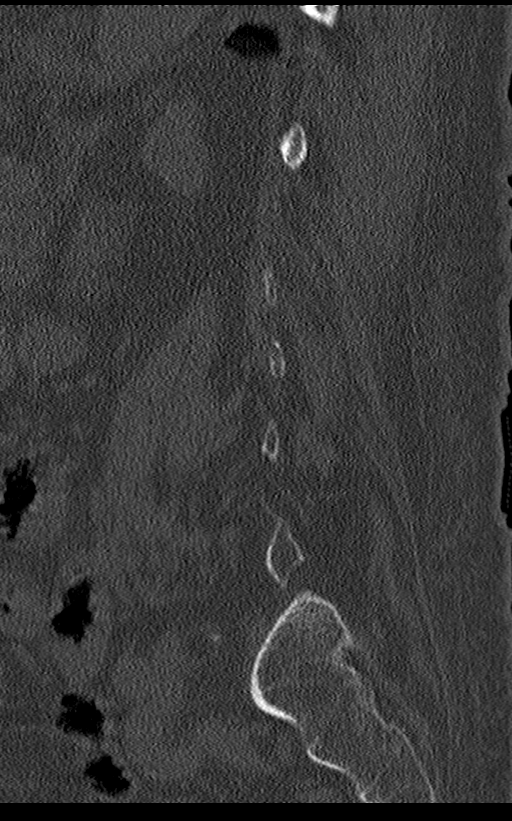
[im 19/55  bone]
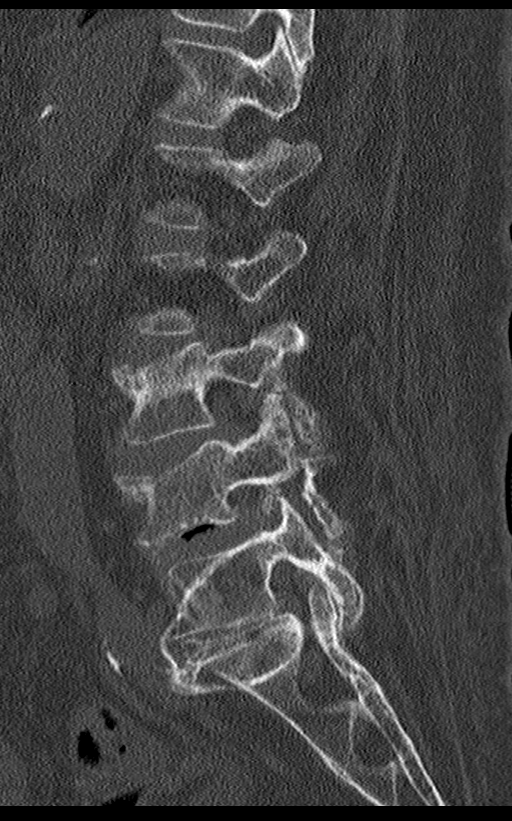
[im 28/55  bone]
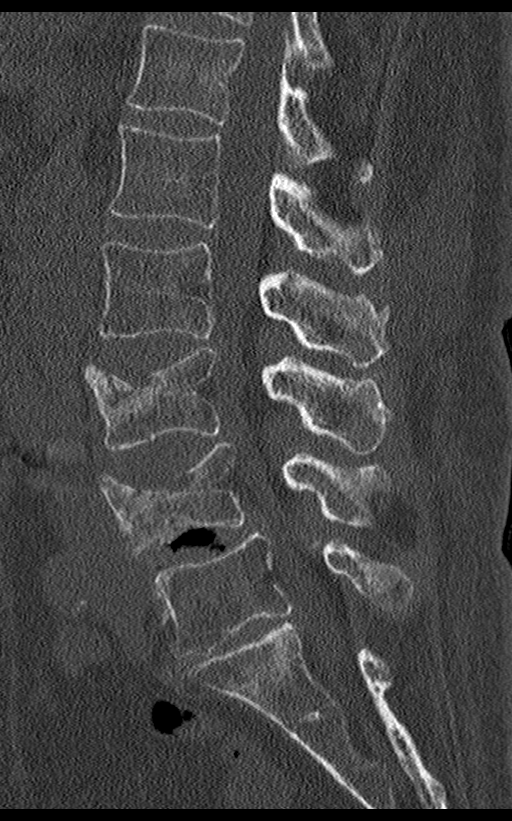
[im 37/55  bone]
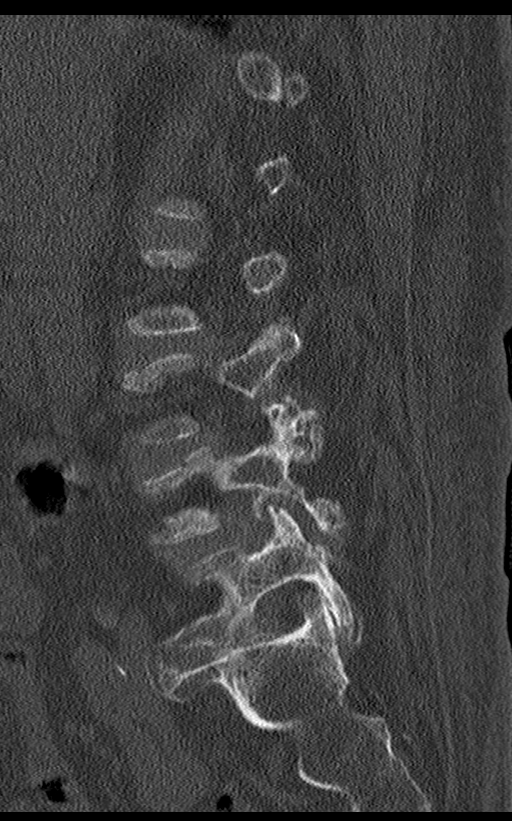
[im 46/55  bone]
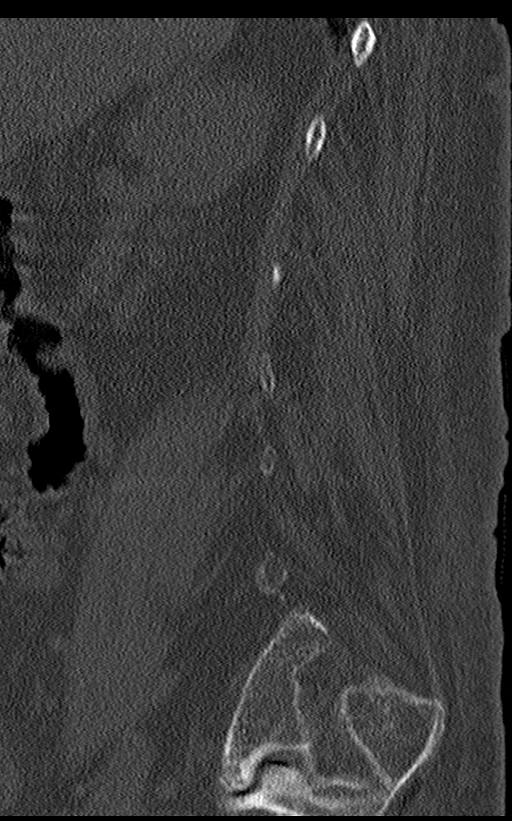

[Series 7: coronal bone · coronal · 0.22mm/px · 3 of 58 slices shown]
[im 12/58  bone]
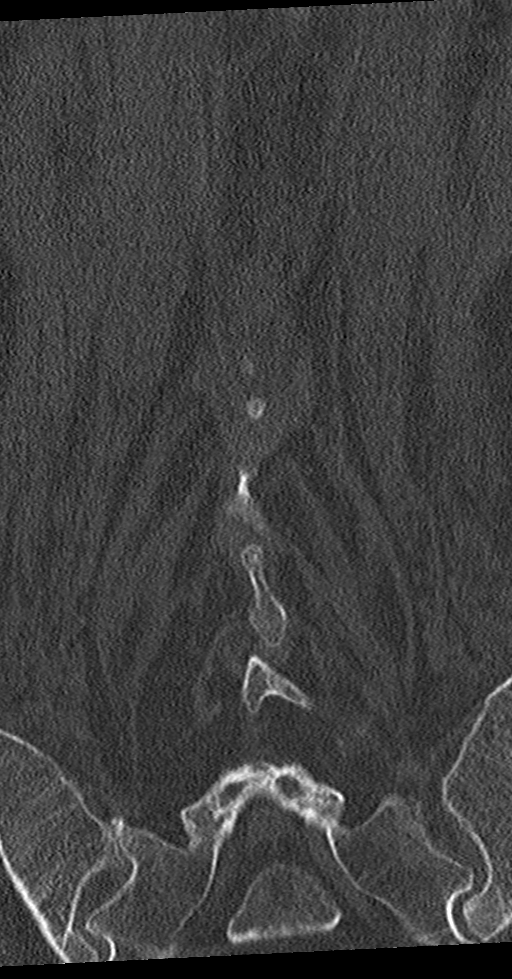
[im 23/58  bone]
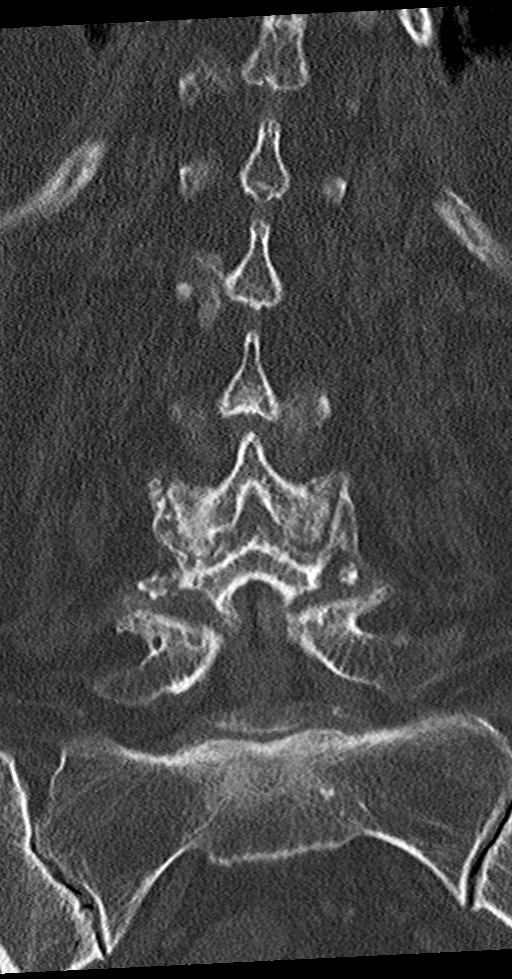
[im 35/58  bone]
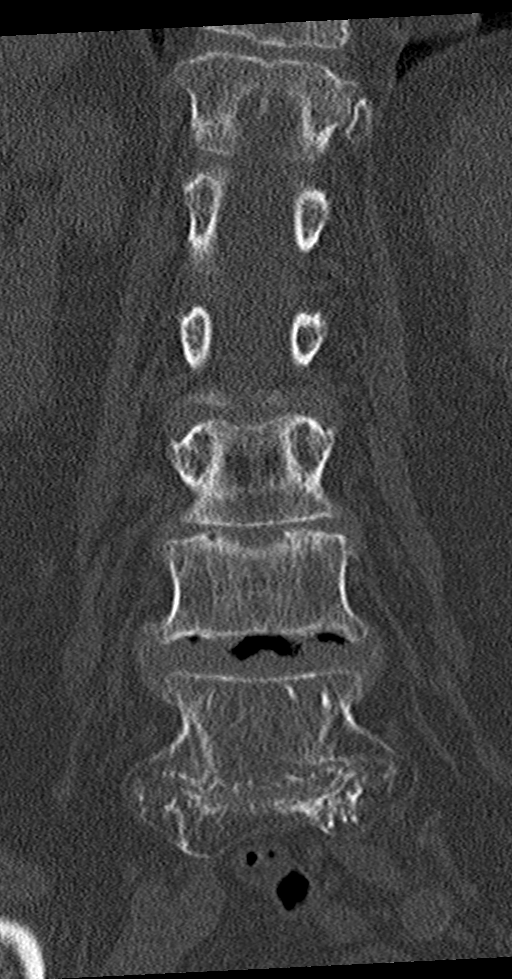

[Series 9: orthongonal axials · axial · 0.23mm/px · z∈[+1162,+1307]mm · 4 of 104 slices shown, 5 images]
[im 18/104  soft-tissue]
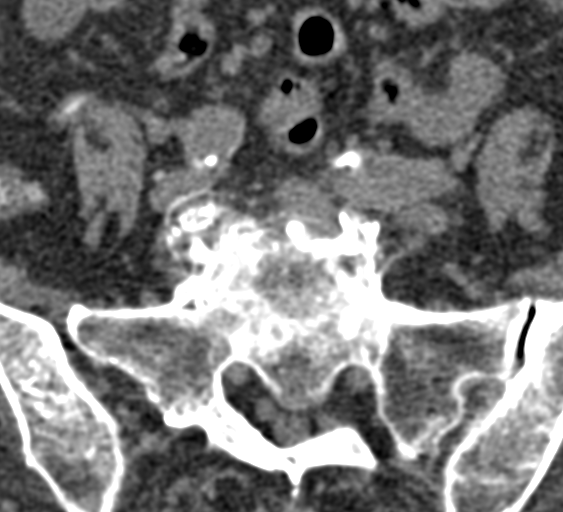
[im 18/104  bone]
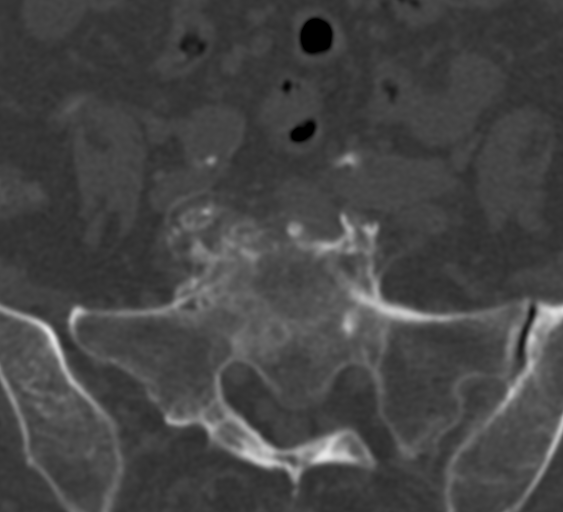
[im 35/104  bone]
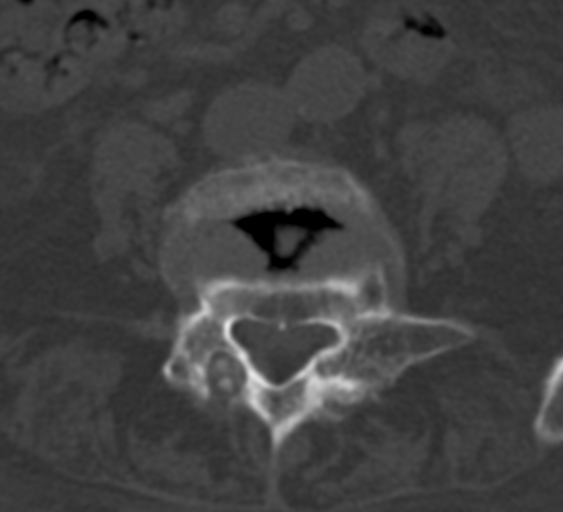
[im 69/104  bone]
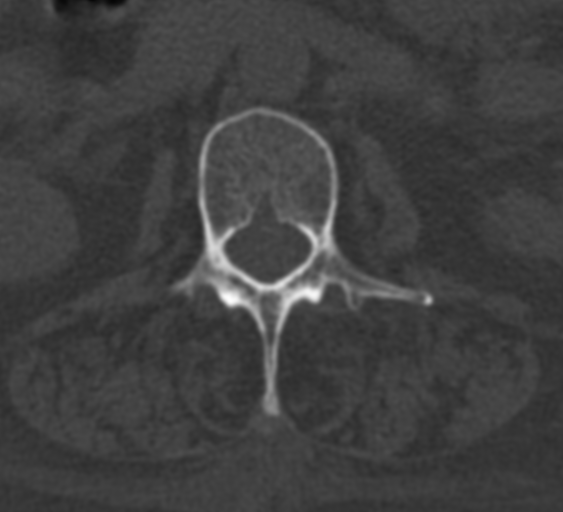
[im 86/104  bone]
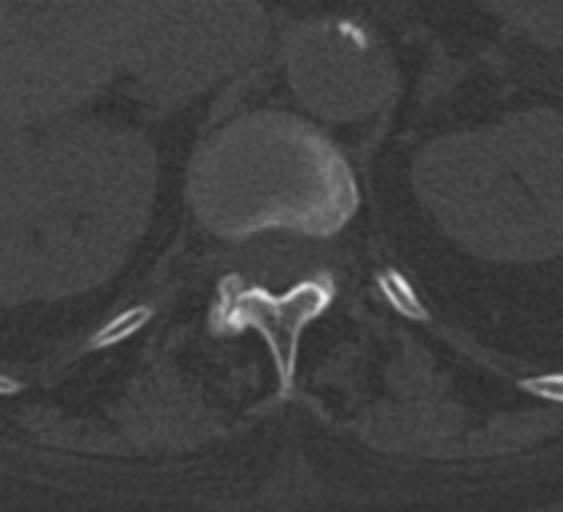

[12 of 33 positions shown; findings below may reference images not displayed]

FINDINGS: Segmentation: Normal

Alignment: 5 mm anterolisthesis L4-5.  Remaining alignment normal

Vertebrae: Moderately severe superior endplate fractures of L3 and
L4. These appear to be recent fractures with sharply defined
cortical defects of superior endplate of both L3 and L4. No
retropulsion of bone into the canal. No underlying mass lesion
identified. No other fracture.

Paraspinal and other soft tissues: Negative for paraspinous mass,
adenopathy, fluid collection. Atherosclerotic aorta without
aneurysm.

Disc levels: L1-2: Mild disc and facet degeneration. Negative for
stenosis

L2-3: Disc bulging and facet degeneration with moderate spinal
stenosis. Neural foramina patent bilaterally

L3-4: Diffuse disc bulging and moderate facet hypertrophy. Moderate
spinal stenosis. Neural foramina patent bilaterally.

L4-5: 5 mm anterolisthesis with severe facet degeneration
bilaterally. Diffuse disc bulging and severe spinal stenosis.
Moderate to severe subarticular and foraminal stenosis bilaterally

L5-S1: Disc and facet degeneration. Mild subarticular stenosis
bilaterally.
IMPRESSION: Moderately severe compression fracture of L3 and L4 which appear
recent. No underlying mass or retropulsion of bone into the canal.
Correlate with trauma history.

Multilevel degenerative change. Moderate spinal stenosis L3-4 and
severe spinal stenosis L4-5.

## 2021-03-02 ENCOUNTER — Telehealth: Payer: Self-pay

## 2021-03-02 NOTE — Telephone Encounter (Signed)
Pt notified, verbalized understanding.  No further needs at this time. Confirmed upcoming appointments.

## 2021-03-02 NOTE — Telephone Encounter (Signed)
-----   Message from Gardenia Phlegm, NP sent at 03/01/2021  5:25 PM EDT ----- Neg ct brain, please notify patient ----- Message ----- From: Interface, Rad Results In Sent: 02/23/2021   9:31 AM EDT To: Chauncey Cruel, MD

## 2021-03-06 ENCOUNTER — Other Ambulatory Visit: Payer: Self-pay | Admitting: Oncology

## 2021-03-07 ENCOUNTER — Emergency Department (HOSPITAL_BASED_OUTPATIENT_CLINIC_OR_DEPARTMENT_OTHER): Payer: Medicare HMO

## 2021-03-07 ENCOUNTER — Emergency Department (HOSPITAL_BASED_OUTPATIENT_CLINIC_OR_DEPARTMENT_OTHER)
Admission: EM | Admit: 2021-03-07 | Discharge: 2021-03-08 | Disposition: A | Discharge status: Home / Self Care | Payer: Medicare HMO | Attending: Emergency Medicine | Admitting: Emergency Medicine

## 2021-03-07 ENCOUNTER — Encounter (HOSPITAL_BASED_OUTPATIENT_CLINIC_OR_DEPARTMENT_OTHER): Payer: Self-pay | Admitting: Obstetrics and Gynecology

## 2021-03-07 ENCOUNTER — Telehealth: Payer: Self-pay

## 2021-03-07 ENCOUNTER — Other Ambulatory Visit: Payer: Self-pay

## 2021-03-07 DIAGNOSIS — Z79899 Other long term (current) drug therapy: Secondary | ICD-10-CM | POA: Diagnosis not present

## 2021-03-07 DIAGNOSIS — R21 Rash and other nonspecific skin eruption: Secondary | ICD-10-CM | POA: Diagnosis not present

## 2021-03-07 DIAGNOSIS — S81802A Unspecified open wound, left lower leg, initial encounter: Secondary | ICD-10-CM

## 2021-03-07 DIAGNOSIS — R609 Edema, unspecified: Secondary | ICD-10-CM | POA: Diagnosis not present

## 2021-03-07 DIAGNOSIS — R600 Localized edema: Secondary | ICD-10-CM | POA: Diagnosis not present

## 2021-03-07 DIAGNOSIS — M7989 Other specified soft tissue disorders: Secondary | ICD-10-CM

## 2021-03-07 DIAGNOSIS — I10 Essential (primary) hypertension: Secondary | ICD-10-CM | POA: Diagnosis not present

## 2021-03-07 DIAGNOSIS — X58XXXA Exposure to other specified factors, initial encounter: Secondary | ICD-10-CM | POA: Insufficient documentation

## 2021-03-07 DIAGNOSIS — L299 Pruritus, unspecified: Secondary | ICD-10-CM | POA: Diagnosis not present

## 2021-03-07 DIAGNOSIS — M79662 Pain in left lower leg: Secondary | ICD-10-CM | POA: Diagnosis not present

## 2021-03-07 IMAGING — US US EXTREM LOW VENOUS*L*
1 series · 14 of 24 positions shown · non-contrast
Comparison: None.

CLINICAL DATA: Left lower extremity pain and edema history of rash

EXAM:
Left LOWER EXTREMITY VENOUS DOPPLER ULTRASOUND
TECHNIQUE: Gray-scale sonography with compression, as well as color and duplex
ultrasound, were performed to evaluate the deep venous system(s)
from the level of the common femoral vein through the popliteal and
proximal calf veins.

[Series 1: us venous img lower uni left (dvt) · portal-venous · 14 of 30 slices shown]
[im 1/30]
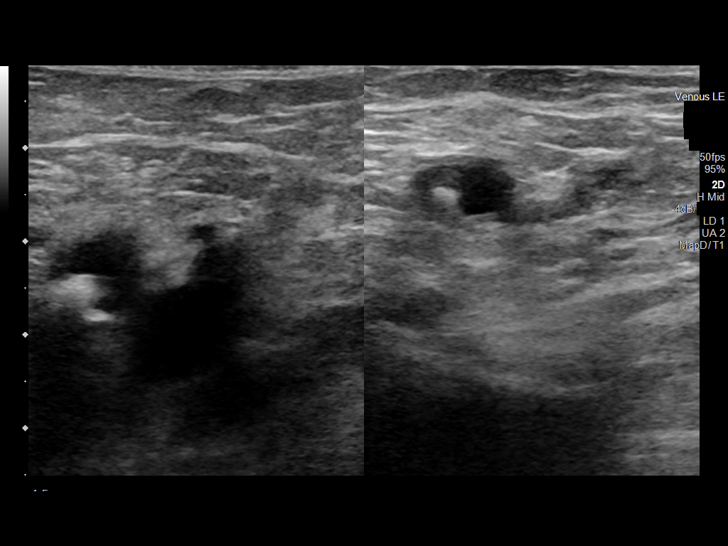
[im 3/30]
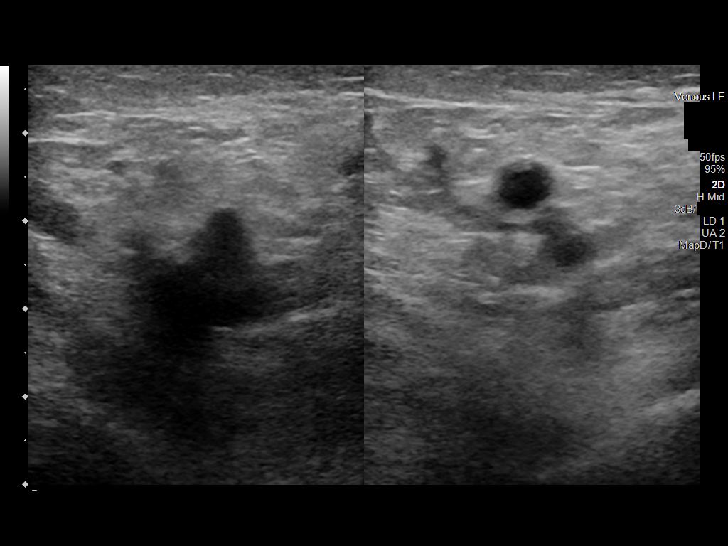
[im 6/30]
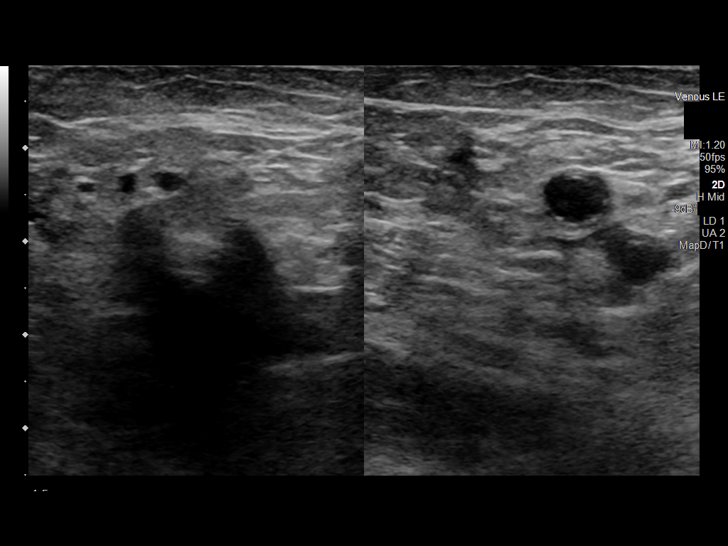
[im 8/30]
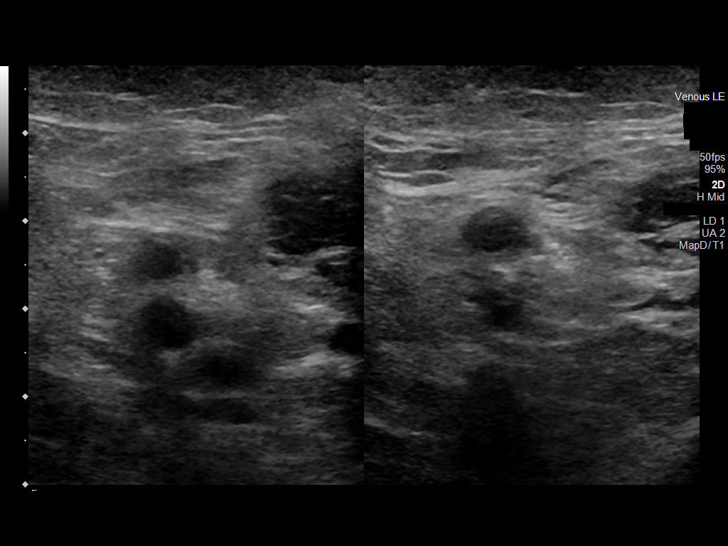
[im 9/30]
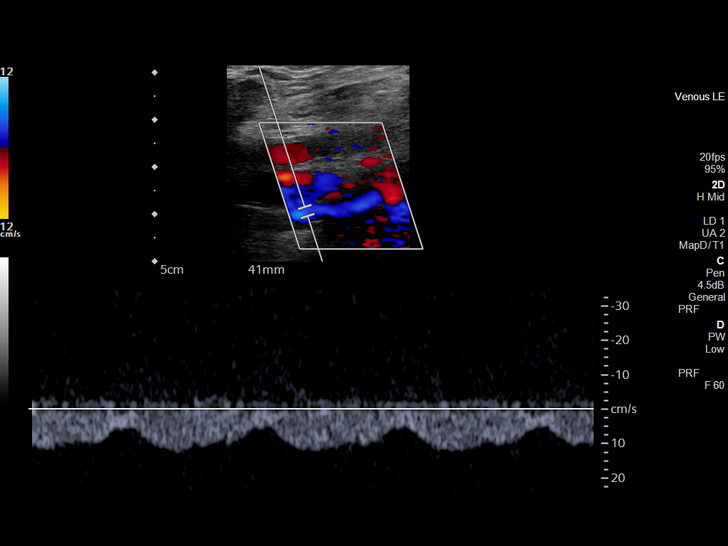
[im 12/30]
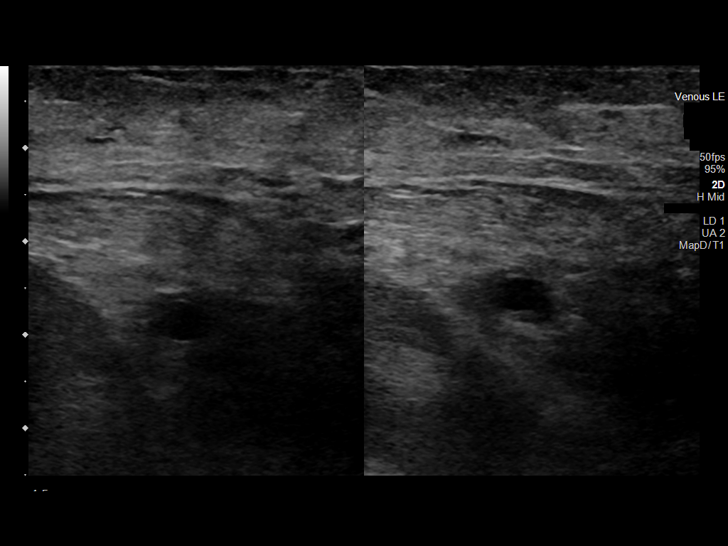
[im 14/30]
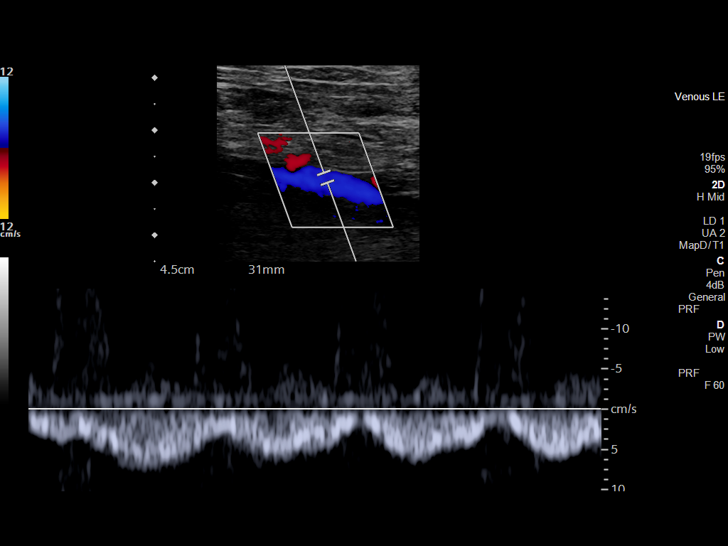
[im 16/30]
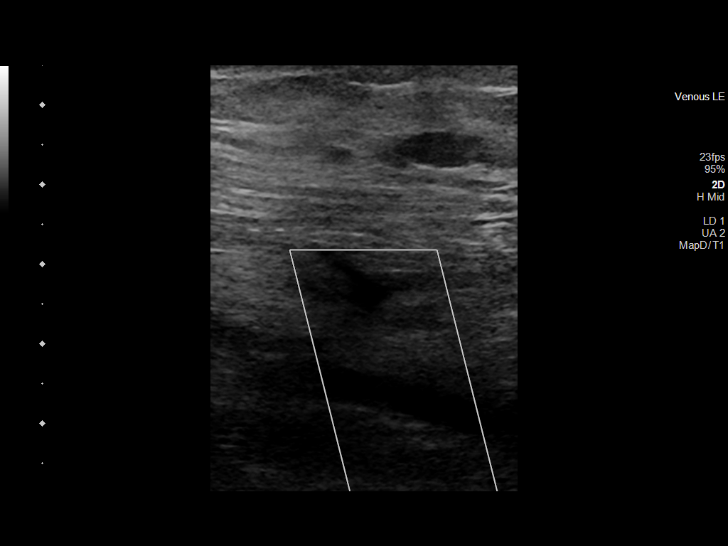
[im 18/30]
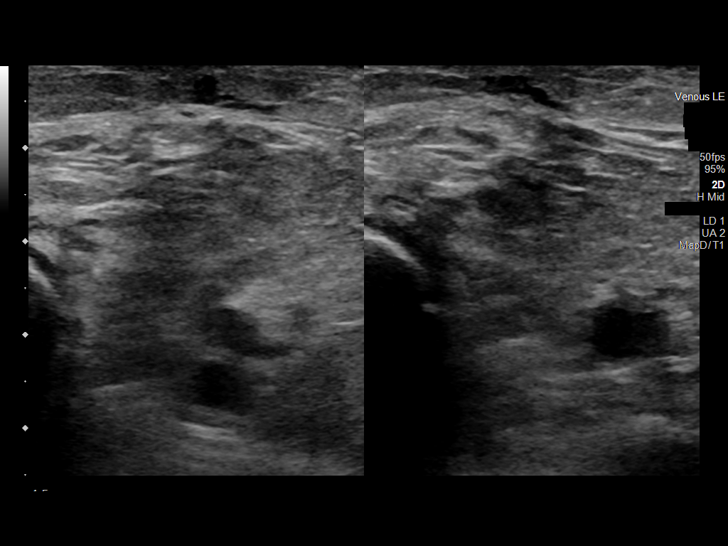
[im 21/30]
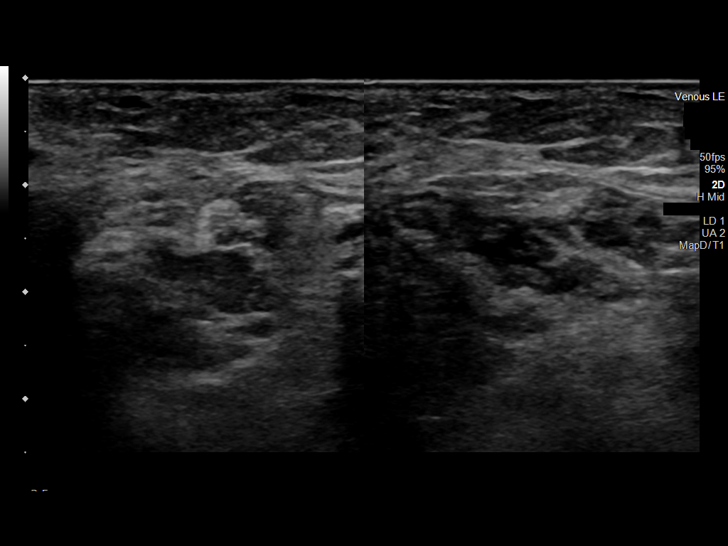
[im 23/30]
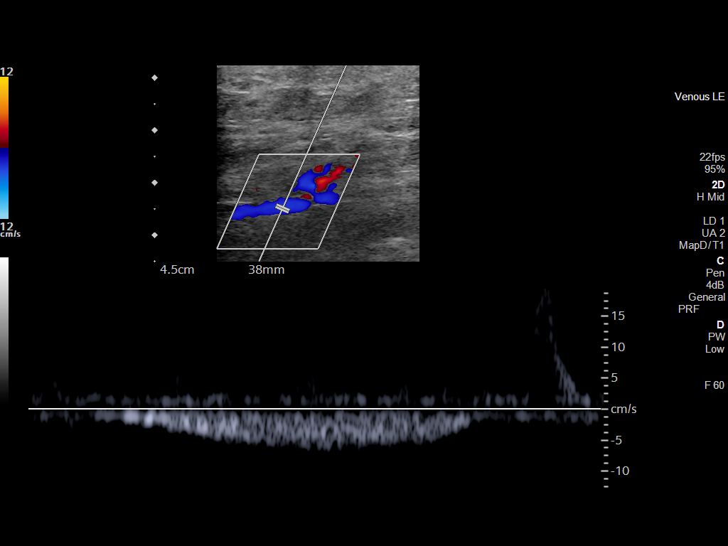
[im 24/30]
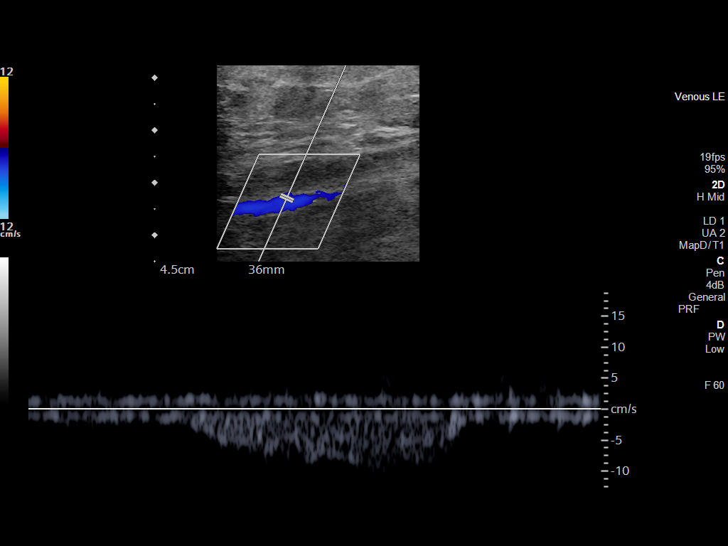
[im 27/30]
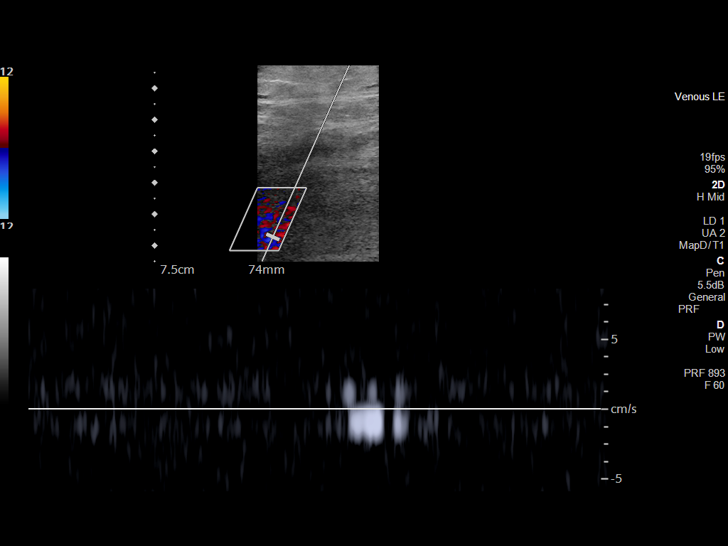
[im 30/30]
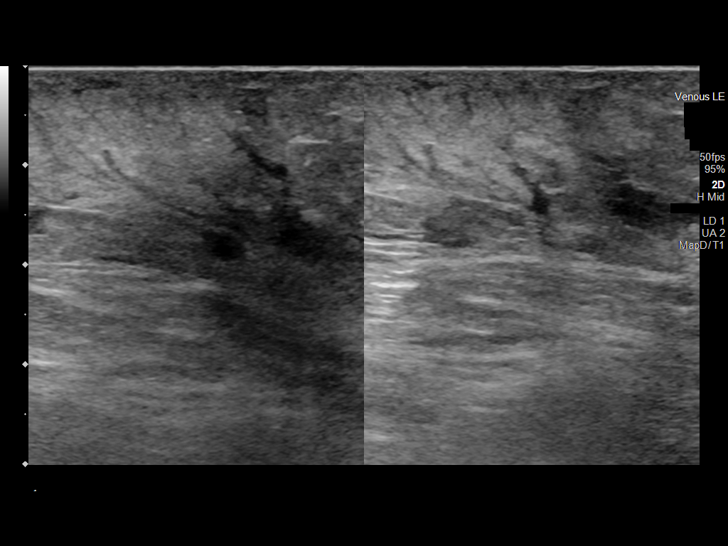

[14 of 24 positions shown; findings below may reference images not displayed]

FINDINGS: VENOUS

Normal compressibility of the common femoral, superficial femoral,
and popliteal veins, as well as the visualized calf veins.
Visualized portions of profunda femoral vein and great saphenous
vein unremarkable. No filling defects to suggest DVT on grayscale or
color Doppler imaging. Doppler waveforms show normal direction of
venous flow, normal respiratory plasticity and response to
augmentation.

Limited views of the contralateral common femoral vein are
unremarkable.

OTHER

None.

Limitations: none
IMPRESSION: Negative.

## 2021-03-07 MED ORDER — OXYCODONE-ACETAMINOPHEN 5-325 MG PO TABS
2.0000 | ORAL_TABLET | Freq: Once | ORAL | Status: AC
  Administered 2021-03-07: 2 via ORAL
  Filled 2021-03-07: qty 2

## 2021-03-07 MED ORDER — CEPHALEXIN 500 MG PO CAPS: 500.0000 mg | ORAL_CAPSULE | Freq: Four times a day (QID) | ORAL | 0 refills | Status: DC

## 2021-03-07 MED ORDER — CEPHALEXIN 250 MG PO CAPS
500.0000 mg | ORAL_CAPSULE | Freq: Once | ORAL | Status: AC
  Administered 2021-03-08: 500 mg via ORAL
  Filled 2021-03-07: qty 2

## 2021-03-07 NOTE — Discharge Instructions (Signed)
Please keep leg elevated Keep dressing in place Take antibiotics as prescribed Please have recheck with your doctor in 1 to 3 days Return the emergency department if you are worse at any time.

## 2021-03-07 NOTE — ED Provider Notes (Signed)
[facility] DEPT Provider Note   CSN: [id number] Arrival date & time: 03/07/21  1645     History Chief Complaint  Patient presents with   Wound Check    Debbie Barry is a 70 y.o. female.  HPI 70 yo female presents today complaining of leg wound bilateral lower extremity x one week, right leg healed. There was no injury.  She reports that several weeks ago she has a rash on her upper extremities and abdomen.  Later she had wounds on her bilateral lower extremities.  The wound on her right leg healed well but the wound on her left leg continues.  She does have a history of peripheral vascular disease but denies diabetes.  She has not had fever, chills, swelling, or other injury.  She denies having to have any interventions previously for wound care of this wound.  Past medical history lists osteomyelitis of her right third toe.  She has some noted some swelling of the leg and voices concern for DVT.  She has been told in the past that she has peripheral vascular disease.      Past Medical History:  Diagnosis Date   Fatty liver    GERD (gastroesophageal reflux disease)    Hypertension    Osteomyelitis ([place])    right third toe   Peripheral vascular disease ([place])    Ulcer 08/14/20    Patient Active Problem List   Diagnosis Date Noted   Iron overload 02/13/2021   Neuropathy 02/13/2021   Elevated LFTs 02/13/2021   Weight loss, non-intentional 02/13/2021   Falls frequently 02/13/2021   GI bleed 08/13/2020   UGIB (upper gastrointestinal bleed) 08/12/2020   Depression 08/12/2020   Hypokalemia 08/12/2020   Essential hypertension, benign 03/23/2019    Past Surgical History:  Procedure Laterality Date   AMPUTATION Right 08/08/2017   Procedure: AMPUTATION RIGHT 3RD TOE;  Surgeon: Newt Minion, MD;  Location: [facility];  Service: Orthopedics;  Laterality: Right;   BIOPSY  08/14/2020   Procedure: BIOPSY;  Surgeon: Clarene Essex, MD;  Location: [facility];   Service: Endoscopy;;   Bunionectomy Right 2006     Right   Bunionectony Left 2006      August   COLONOSCOPY N/A 01/21/2014   Procedure: COLONOSCOPY;  Surgeon: Beryle Beams, MD;  Location: [facility];  Service: Endoscopy;  Laterality: N/A;   ESOPHAGOGASTRODUODENOSCOPY (EGD) WITH PROPOFOL N/A 08/14/2020   Procedure: ESOPHAGOGASTRODUODENOSCOPY (EGD) WITH PROPOFOL;  Surgeon: Clarene Essex, MD;  Location: [facility];  Service: Endoscopy;  Laterality: N/A;   TUBAL LIGATION     WISDOM TOOTH EXTRACTION       OB History   No obstetric history on file.     Family History  Problem Relation Age of Onset   Breast cancer Mother    Colon cancer Mother    Heart Problems Mother    Hypertension Mother    Multiple myeloma Mother    Arthritis Mother    Cancer Mother    Heart disease Mother    Hypertension Father    Heart disease Father    Multiple myeloma Father    Arthritis Father    Cancer Father     Social History   Tobacco Use   Smoking status: Never   Smokeless tobacco: Never  Vaping Use   Vaping Use: Never used  Substance Use Topics   Alcohol use: Yes    Alcohol/week: 5.0 standard drinks    Types: 5 Glasses of wine per  week    Comment: social glass of wine   Drug use: No    Home Medications Prior to Admission medications   Medication Sig Start Date End Date Taking? Authorizing Provider  acetaminophen (TYLENOL) 650 MG CR tablet Take 650 mg by mouth every 8 (eight) hours as needed for pain.    [provider]  amLODipine (NORVASC) 5 MG tablet Take 1 tablet (5 mg total) by mouth daily. 02/01/21   Bary Castilla, NP  Cholecalciferol (VITAMIN D3) 5000 units CAPS Take 5,000 Units by mouth daily.    [provider]  Cyanocobalamin (B-12 PO) Take by mouth. Patient not taking: Reported on 02/13/2021    [provider]  Magnesium 400 MG CAPS Take 1 capsule by mouth daily in the evening. 02/08/21   Bary Castilla, NP  Multiple Vitamin (MULTIVITAMIN  WITH MINERALS) TABS tablet Take 1 tablet by mouth daily.    [provider]  pantoprazole (PROTONIX) 40 MG tablet Take 1 tablet (40 mg total) by mouth 2 (two) times daily before a meal. Take $RemoveBe'40mg'CsBtijOfv$  twice daily for 2 weeks, then decrease to $RemoveBef'40mg'HaPhUroiJa$  once daily 08/15/20   Dessa Phi, DO  potassium chloride SA (KLOR-CON) 20 MEQ tablet Take 1 tablet (20 mEq total) by mouth 2 (two) times daily. 02/14/21   Magrinat, Virgie Dad, MD  predniSONE (STERAPRED UNI-PAK 21 TAB) 10 MG (21) TBPK tablet Use as directed 02/01/21   Bary Castilla, NP  sertraline (ZOLOFT) 25 MG tablet Take 1 tablet (25 mg total) by mouth daily. 02/01/21   Bary Castilla, NP  Tetrahydrozoline HCl (VISINE OP) Place 2 drops into both eyes daily as needed (for dry/irritated eyes).     [provider]  triamcinolone ointment (KENALOG) 0.5 % Apply 1 application topically 2 (two) times daily. 02/01/21   Bary Castilla, NP    Allergies    Patient has no known allergies.  Review of Systems   Review of Systems  All other systems reviewed and are negative.  Physical Exam Updated Vital Signs BP (!) 149/88 (BP Location: Left Arm)   Pulse (!) 101   Temp 98.4 F (36.9 C) (Oral)   Resp 16   SpO2 100%   Physical Exam Vitals and nursing note reviewed.  Constitutional:      Appearance: Normal appearance.  HENT:     Head: Normocephalic and atraumatic.     Right Ear: External ear normal.     Left Ear: External ear normal.     Nose: Nose normal.     Mouth/Throat:     Mouth: Mucous membranes are moist.     Pharynx: Oropharynx is clear.  Eyes:     Pupils: Pupils are equal, round, and reactive to light.  Cardiovascular:     Rate and Rhythm: Normal rate and regular rhythm.     Heart sounds: Normal heart sounds.     Comments: Patient has decreased pulses bilateral feet but they are palpable and equal Pulmonary:     Effort: Pulmonary effort is normal.  Abdominal:     General: Bowel sounds are normal.     Palpations:  Abdomen is soft.  Musculoskeletal:        General: Normal range of motion.     Cervical back: Normal range of motion.     Comments: Some diffuse swelling of left lower leg with some lateral wounds that appear just through the skin.  There is no surrounding redness, fluctuance, or discharge. Wound is shallow ulcer approximately 3 x 4 cm  Skin:    General: Skin is warm and dry.     Capillary Refill: Capillary refill takes less than 2 seconds.     Findings: Lesion present. No rash.  Neurological:     General: No focal deficit present.     Mental Status: She is alert and oriented to person, place, and time.     Cranial Nerves: No cranial nerve deficit.  Psychiatric:        Mood and Affect: Mood normal.        Behavior: Behavior normal.    ED Results / Procedures / Treatments   Labs (all labs ordered are listed, but only abnormal results are displayed) Labs Reviewed - No data to display  EKG None  Radiology No results found.  Procedures Procedures   Medications Ordered in ED Medications - No data to display  ED Course  I have reviewed the triage vital signs and the nursing notes.  Pertinent labs & imaging results that were available during my care of the patient were reviewed by me and considered in my medical decision making (see chart for details).    MDM Rules/Calculators/A&P                           70 year old female with known peripheral vascular disease presents today complaining wound on her left lower leg as well as some swelling in her left calf.  Doppler obtained reveals no evidence of DVT.  Patient does not appear to have any signs of cellulitis or infection.  Given her known peripheral vascular disease, patient is being placed on Keflex.  She is given written information regarding her need for follow-up and return precautions. Final Clinical Impression(s) / ED Diagnoses Final diagnoses:  Wound of left lower extremity, initial encounter    Rx / DC  Orders ED Discharge Orders     None        Pattricia Boss, MD 03/08/21 1621

## 2021-03-07 NOTE — Telephone Encounter (Signed)
The pt was notified to go to the ER for her leg pain, the pt said that it's swollen, leaking fluids, and feels warm to the touch.

## 2021-03-07 NOTE — ED Notes (Signed)
Patient sitting on stretcher with ABCs intact, alert and oriented x4, respirations even and unlabored. Patient here with wound on left lower leg. States has had rash for over three weeks, treated with steroids. Patient stated wound on leg appeared several days ago. Denies fever. States called primary care provider who did not give her any more medication. And stated oncology clinic ran several tests. Patient in low, locked stretcher with side rails up x2, call bell in reach.

## 2021-03-07 NOTE — ED Triage Notes (Signed)
Patient reports to the ER via EMS: Patient reports she has a rash and sore on the left leg. Patient reports she recently had a rash and was prescribed prednisone and an ointment.

## 2021-03-07 NOTE — Telephone Encounter (Signed)
Yes tell her to go to the emergency room. Thank you

## 2021-03-08 NOTE — ED Notes (Signed)
Patient left ED with ABCs intact, alert and oriented x4, respirations even and unlabored. Discharge instructions reviewed and all questions answered.   

## 2021-03-08 NOTE — ED Notes (Signed)
Dressing applied to wound.  

## 2021-03-09 ENCOUNTER — Telehealth: Payer: Self-pay

## 2021-03-09 NOTE — Telephone Encounter (Signed)
Left pt vm to give [place] a call back. Wanted to check in on pt being she was discharged on 03/07/2021, & if she would like an apt, if appropriate for her.

## 2021-03-13 ENCOUNTER — Other Ambulatory Visit: Payer: Self-pay | Admitting: Nurse Practitioner

## 2021-03-15 ENCOUNTER — Ambulatory Visit: Payer: Medicare HMO | Admitting: Oncology

## 2021-03-15 ENCOUNTER — Other Ambulatory Visit: Payer: Medicare HMO

## 2021-03-16 ENCOUNTER — Telehealth: Payer: Self-pay | Admitting: Oncology

## 2021-03-16 NOTE — Telephone Encounter (Signed)
R/s appts per 9/16 sch msg. Pt is aware.

## 2021-03-21 ENCOUNTER — Inpatient Hospital Stay: Payer: Medicare HMO

## 2021-03-21 ENCOUNTER — Inpatient Hospital Stay: Payer: Medicare HMO | Admitting: Oncology

## 2021-03-25 NOTE — Progress Notes (Signed)
Debbie Barry  [phone] [phone]     ID: Debbie Barry DOB: 12-Sep-1950  MR#: [id number]  VOZ#:[id number]  Patient Care Team: Glendale Chard, MD as PCP - General (Internal Medicine) Kahli Fitzgerald, Virgie Dad, MD as Consulting Physician (Oncology) Clarene Essex, MD as Consulting Physician (Gastroenterology) Chauncey Cruel, MD OTHER MD:  CHIEF COMPLAINT: iron overload  CURRENT TREATMENT: observation   INTERVAL HISTORY: Debbie Barry was scheduled today for follow up of her iron overload. She was evaluated in the hematology clinic on 02/13/2021.  At that time her iron saturation was greater than 81% and her ferritin 1450.  She underwent DNA mutation analysis for hemochromatosis at consultation. Results showed no detected mutations.  Hemoglobin was 12.1 with an MCV of 98, white cell count was 5.2, with a normal differential, and the platelets were 212,000, sedimentation rate was 1, ANA was negative, B12 was greater than 2000, hemoglobin A1c was 4.5.  Liver function tests showed a normal bilirubin, AST 53 (unchanged from a year ago) ALT 40, and alkaline phosphatase 139 (decreased from a year ago).  Albumin was 2.4 and total globulin 2.9.  Renal function was normal  Since consultation, she underwent lumbar spine CT on 02/22/2021 showing: moderately severe compression fracture of L3 and L4 which appear recent, no underlying mass or retropulsion of bone into canal.  Head CT performed the same day showed no acute abnormality.   REVIEW OF SYSTEMS: Debbie Barry   COVID 92 VACCINATION STATUS: Status post [place] x2, no booster as of August 2022   HISTORY OF CURRENT ILLNESS: From the original intake note:  Debbie Barry is a 70 year old [place] woman referred by primary care for evaluation of high iron levels.  The patient was seen by Dr. Larena Glassman nurse practitioner 02/01/2021 with complaint of generalized weakness, numbness and a rash.  She was treated with steroids.   Extensive lab work was obtained, including a protein electrophoresis which showed no M spike, but total protein 4.9, albumin 2.6, globulin 1.0.  C-Met showed a normal creatinine and GFR, but low potassium, low magnesium, and elevated alkaline phosphatase and liver function tests.  White cells were 5.2, hemoglobin 12.1, MCV 98, and platelets 212,000.  Hemoglobin A1c was 4.5 vitamin B12 was elevated.  Thyroid studies were normal.  However the ferritin was elevated at 1450, and the iron saturation was 81%.  The patient was referred for further evaluation of her apparent iron overload.  Of note, iron saturation on 03/14/2020 was 78%.  In addition, she had an admission February 2022 with hematemesis and anemia.  She underwent EGD which showed a nonbleeding ulcer.  She was treated for H. pylori.  This should have decreased the iron saturation but the opposite was the case.   The patient's subsequent history is as detailed below.   PAST MEDICAL HISTORY: Past Medical History:  Diagnosis Date   Fatty liver    GERD (gastroesophageal reflux disease)    Hypertension    Osteomyelitis ([place])    right third toe   Peripheral vascular disease ([place])    Ulcer 08/14/20    PAST SURGICAL HISTORY: Past Surgical History:  Procedure Laterality Date   AMPUTATION Right 08/08/2017   Procedure: AMPUTATION RIGHT 3RD TOE;  Surgeon: Newt Minion, MD;  Location: [place] [facility] [facility];  Service: Orthopedics;  Laterality: Right;   BIOPSY  08/14/2020   Procedure: BIOPSY;  Surgeon: Clarene Essex, MD;  Location: [facility];  Service: Endoscopy;;   Bunionectomy Right 2006     Right  Bunionectony Left 2006      August   COLONOSCOPY N/A 01/21/2014   Procedure: COLONOSCOPY;  Surgeon: Beryle Beams, MD;  Location: [facility];  Service: Endoscopy;  Laterality: N/A;   ESOPHAGOGASTRODUODENOSCOPY (EGD) WITH PROPOFOL N/A 08/14/2020   Procedure: ESOPHAGOGASTRODUODENOSCOPY (EGD) WITH PROPOFOL;  Surgeon: Clarene Essex, MD;  Location: [facility];   Service: Endoscopy;  Laterality: N/A;   TUBAL LIGATION     WISDOM TOOTH EXTRACTION      FAMILY HISTORY Family History  Problem Relation Age of Onset   Breast cancer Mother    Colon cancer Mother    Heart Problems Mother    Hypertension Mother    Multiple myeloma Mother    Arthritis Mother    Cancer Mother    Heart disease Mother    Hypertension Father    Heart disease Father    Multiple myeloma Father    Arthritis Father    Cancer Father   The patient's father died from complications of multiple myeloma at age 35.  He had 1 brother who died in his 71s from unknown causes.  The patient has no further information on that side of the family.  The patient's mother died with colon cancer at age 50.  One maternal cousin had lupus.  There is no history of hemochromatosis in the family as far as the patient knows.  She is an only child   GYNECOLOGIC HISTORY:  No LMP recorded. Patient is postmenopausal. Menarche: 70 years old Age at first live birth: 70 years old Graf P 2 LMP 43 Hysterectomy?  No Salpingo-oophorectomy?  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  Her husband works in Land at a child development center.  At home is generally just the 2 of them plus their border collie.  Daughter Debbie Barry llives in [place] and is a homemaker.  She has 4 children.  Son Debbie Barry also lives in [place] and is a Event organiser.  He has 3 children.  In addition to the 7 grandchildren the patient has 1 great grandchild.  She is not a church attender    ADVANCED DIRECTIVES: In the absence of any documentation to the contrary, the patient's spouse is their HCPOA.    HEALTH MAINTENANCE: Social History   Tobacco Use   Smoking status: Never   Smokeless tobacco: Never  Vaping Use   Vaping Use: Never used  Substance Use Topics   Alcohol use: Yes    Alcohol/week: 5.0 standard drinks    Types: 5 Glasses of wine per week    Comment: social  glass of wine   Drug use: No     Colonoscopy:  PAP:  Bone density:   No Known Allergies  Current Outpatient Medications  Medication Sig Dispense Refill   acetaminophen (TYLENOL) 650 MG CR tablet Take 650 mg by mouth every 8 (eight) hours as needed for pain.     amLODipine (NORVASC) 5 MG tablet Take 1 tablet (5 mg total) by mouth daily. 90 tablet 1   cephALEXin (KEFLEX) 500 MG capsule Take 1 capsule (500 mg total) by mouth 4 (four) times daily. 20 capsule 0   Cholecalciferol (VITAMIN D3) 5000 units CAPS Take 5,000 Units by mouth daily.     Cyanocobalamin (B-12 PO) Take by mouth. (Patient not taking: Reported on 02/13/2021)     Magnesium 400 MG CAPS Take 1 capsule by mouth daily in the evening. 30 capsule 0   Multiple Vitamin (MULTIVITAMIN WITH  MINERALS) TABS tablet Take 1 tablet by mouth daily.     pantoprazole (PROTONIX) 40 MG tablet Take 1 tablet (40 mg total) by mouth 2 (two) times daily before a meal. Take 77m twice daily for 2 weeks, then decrease to 445monce daily 60 tablet 0   potassium chloride SA (KLOR-CON) 20 MEQ tablet Take 1 tablet (20 mEq total) by mouth 2 (two) times daily. 60 tablet 3   predniSONE (STERAPRED UNI-PAK 21 TAB) 10 MG (21) TBPK tablet Use as directed 21 tablet 0   sertraline (ZOLOFT) 25 MG tablet Take 1 tablet (25 mg total) by mouth daily. 90 tablet 1   Tetrahydrozoline HCl (VISINE OP) Place 2 drops into both eyes daily as needed (for dry/irritated eyes).      triamcinolone ointment (KENALOG) 0.5 % Apply 1 application topically 2 (two) times daily. 30 g 2   No current facility-administered medications for this visit.    OBJECTIVE: African-American woman examined in a wheelchair  There were no vitals filed for this visit.    There is no height or weight on file to calculate BMI.   Wt Readings from Last 3 Encounters:  02/13/21 123 lb 1.6 oz (55.8 kg)  08/22/20 141 lb 12.8 oz (64.3 kg)  08/14/20 141 lb (64 kg)      ECOG FS:2 - Symptomatic, <50% confined  to bed     LAB RESULTS:  CMP     Component Value Date/Time   NA 143 02/13/2021 1618   NA 139 02/01/2021 1509   K 2.8 (L) 02/13/2021 1618   CL 102 02/13/2021 1618   CO2 30 02/13/2021 1618   GLUCOSE 78 02/13/2021 1618   BUN 11 02/13/2021 1618   BUN 10 02/01/2021 1509   CREATININE 0.66 02/13/2021 1618   CALCIUM 8.3 (L) 02/13/2021 1618   PROT 5.4 (L) 02/13/2021 1618   PROT 4.9 (L) 02/01/2021 1620   ALBUMIN 2.4 (L) 02/13/2021 1618   ALBUMIN 2.4 (L) 02/01/2021 1509   AST 49 (H) 02/13/2021 1618   ALT 46 (H) 02/13/2021 1618   ALKPHOS 126 02/13/2021 1618   BILITOT 0.8 02/13/2021 1618   BILITOT 0.7 02/01/2021 1509   GFRNONAA >60 02/13/2021 1618   GFRAA 105 08/22/2020 1604    Lab Results  Component Value Date   ALBUMINELP 2.6 (L) 02/01/2021   MSPIKE Not Observed 02/01/2021    No results found for: KPAFRELGTCHN, LAMBDASER, KACata[facility] CenterLab Results  Component Value Date   WBC 5.4 02/13/2021   NEUTROABS 3.2 02/13/2021   HGB 11.4 (L) 02/13/2021   HCT 32.3 (L) 02/13/2021   MCV 93.4 02/13/2021   PLT 192 02/13/2021      Chemistry      Component Value Date/Time   NA 143 02/13/2021 1618   NA 139 02/01/2021 1509   K 2.8 (L) 02/13/2021 1618   CL 102 02/13/2021 1618   CO2 30 02/13/2021 1618   BUN 11 02/13/2021 1618   BUN 10 02/01/2021 1509   CREATININE 0.66 02/13/2021 1618      Component Value Date/Time   CALCIUM 8.3 (L) 02/13/2021 1618   ALKPHOS 126 02/13/2021 1618   AST 49 (H) 02/13/2021 1618   ALT 46 (H) 02/13/2021 1618   BILITOT 0.8 02/13/2021 1618   BILITOT 0.7 02/01/2021 1509       No results found for: LABCA2  No components found for: LASJGGEZ662No results for input(s): INR in the last 168 hours.  No results found for: LABCA2  No  results found for: CAN199  No results found for: OJJ009  No results found for: FGH829  No results found for: CA2729  No components found for: HGQUANT  No results found for: CEA1 / No results found for: CEA1   No  results found for: AFPTUMOR  No results found for: CHROMOGRNA  No results found for: PSA1  No visits with results within 3 Day(s) from this visit.  Latest known visit with results is:  Office Visit on 02/13/2021  Component Date Value Ref Range Status   DNA Mutation Analysis 02/13/2021 Comment   Final   Comment: (NOTE) Result: c.845G>A (p.Cys282Tyr) - Not Detected c.187C>G (p.His63Asp) - Not Detected c.193A>T (p.Ser65Cys) - Not Detected Not associated with increased risk to develop clinical symptoms of Hereditary Hemochromatosis. In symptomatic individuals, other causes of iron overload should be evaluated. See Additional Information and Comments. Additional Clinical Information: Hereditary hemochromatosis (HFE related) is an autosomal recessive iron storage disorder. Patients may have a genetic diagnosis of hereditary hemochromatosis and never show clinical symptoms. Clinical symptoms typically appear between 54 to 81 years in males and after menopause in females. Signs and symptoms may include organ damage, primarily in the liver, risk for hepatocellular carcinoma, diabetes, and heart disease due to iron accumulation. Life expectancy may be decreased in individuals who develop cirrhosis. Treatment for clinically symptomatic individuals may include therapeutic phlebotomy. L                          iver transplant may be used to treat end stage liver failure. For preventive care, monitoring for iron overload is recommended for patients who are homozygous for c.845G>A (p.Cys282Tyr) and have yet to experience clinical symptoms. Comments: The most common HFE variants associated with hereditary hemochromatosis are c.845G>A (p.Cys282Tyr), c.187C>G (p.His63Asp), c.193A>T (p.Ser65Cys). While patients homozygous for c.845G>A (p.Cys282Tyr) are the most likely to present clinical symptoms, less than 10% develop clinically significant iron overload with tissue and organ damage. Genetic  counseling is recommended to discuss the potential clinical implications of positive results, as well as recommendations for testing family members. Genetic Coordinators are available for health care providers to discuss results at [phone] ([phone]). Test Details: Three variants analyzed: c.845G>A (p.Cys282Tyr), commonly referred to as C282Y c.187C>G (p.His63Asp), commonly referred to a                          s H63D c.193A>T (p.Ser65Cys), commonly referred to as S65C Methods/Limitations: DNA Analysis of the HFE gene (NM_[id number]) was performed by PCR amplification followed by restriction enzyme digestion analyses. Results must be combined with clinical information for the most accurate interpretation. Molecular-based testing is highly accurate, but as in any laboratory test, diagnostic errors may occur. False positive or false negative results may occur for reasons that include genetic variants, blood transfusions, bone marrow transplantation, somatic or tissue-specific mosaicism, mislabeled samples, or erroneous representation of family relationships. This test was developed and its performance characteristics determined by [facility]. It has not been cleared or approved by the Food and Drug Administration. References: Ula Lingo, [address] [place]dley Milagros Reap LW, [place]; American Association for the Study of Liver Diseases. Diagnosis and management of hemochromatosis: 2011                           practice guideline by the American Association for the Study of Liver Diseases. Hepatology. 2011 Jul;54(1):328-43. doi: 10.1002/hep.24330. PMID: 69678938; PMCID: BOF7510258.  [address], [place] P, Swinkels DW, [employer], Kamarainen O, Patton S, Alonso I, Morris M, [place] best practice guidelines for the molecular genetic diagnosis of hereditary hemochromatosis [facility]). Eur J Hum Genet. 2016 Apr;24(4):479-95. doi: 10.1038/ejhg.2015.128. Epub 2015 Jul 8. PMID: 94503888; PMCID:  KCM0349179. Ruben Reason, PhD, [facility] Jane Canary, PhD Earlean Polka, PhD, [facility] Fannie Knee, PhD, [facility] Threasa Alpha, PhD, [facility] Ileene Hutchinson, PhD, [facility] Lubertha South, PhD, [facility] Alfredo Bach, PhD, [facility] Performed At: [facility] [address] [place], [place] [id number] Katina Degree MDPhD IA:[id number]     (this displays the last labs from the last 3 days)  Lab Results  Component Value Date   ALBUMINELP 2.6 (L) 02/01/2021   MSPIKE Not Observed 02/01/2021   (this displays SPEP labs)  No results found for: KPAFRELGTCHN, LAMBDASER, KAPLAMBRATIO (kappa/lambda light chains)  No results found for: HGBA, HGBA2QUANT, HGBFQUANT, HGBSQUAN (Hemoglobinopathy evaluation)   No results found for: LDH  Lab Results  Component Value Date   IRON 72 02/01/2021   TIBC <89 (LL) 02/01/2021   IRONPCTSAT >81 (HH) 02/01/2021   (Iron and TIBC)  Lab Results  Component Value Date   FERRITIN 1,450 (H) 02/01/2021    Urinalysis    Component Value Date/Time   COLORURINE YELLOW 08/12/2020 [place] 08/12/2020 1223   LABSPEC 1.020 08/12/2020 1223   PHURINE 7.0 08/12/2020 1223   [place] 08/12/2020 1223   [place] 08/12/2020 1223   [place] 08/12/2020 1223   BILIRUBINUR negative 03/30/2020 1215   KETONESUR 80 (A) 08/12/2020 [place] 08/12/2020 1223   UROBILINOGEN 0.2 03/30/2020 1215   NITRITE NEGATIVE 08/12/2020 1223   [place] 08/12/2020 1223    STUDIES: US Venous Img Lower Unilateral Left (DVT)  Result Date: 03/07/2021 CLINICAL DATA:  Left lower extremity pain and edema history of rash EXAM: Left LOWER EXTREMITY VENOUS DOPPLER ULTRASOUND TECHNIQUE: Gray-scale sonography with compression, as well as color and duplex ultrasound, were performed to evaluate the deep venous system(s) from the level of the common femoral vein through the popliteal and proximal calf veins. COMPARISON:  None.  FINDINGS: VENOUS Normal compressibility of the common femoral, superficial femoral, and popliteal veins, as well as the visualized calf veins. Visualized portions of profunda femoral vein and great saphenous vein unremarkable. No filling defects to suggest DVT on grayscale or color Doppler imaging. Doppler waveforms show normal direction of venous flow, normal respiratory plasticity and response to augmentation. Limited views of the contralateral common femoral vein are unremarkable. OTHER None. Limitations: none IMPRESSION: Negative. Electronically Signed   By: Donavan Foil M.D.   On: 03/07/2021 23:14    ELIGIBLE FOR AVAILABLE RESEARCH PROTOCOL: no  ASSESSMENT: 70 y.o. [place] woman presenting with fatigue, elevated liver function tests and very elevated ferritin and iron saturation, persistent on repeat  (1) negative genetics testing for hemochromatosis  (2) no evidence of hypertransfusion or beta thalassemia  PLAN: The cause of Eithel's iron overload is not clear to me. She has mild liver abnormalities c/w steatosis. She does not carry the hemachromatosis genes and there is no evidence of thalassemia. She is not on iron supplements. There may be a hard-to-document rare genetic defect in iron handling (it would be helpful to screen family members). Possibley a bone marrow biopsy could establish a cause of ineffective erythropoiesis  In any case she did not show for her appointment 03/26/2021. A follow-up letter has been sent.   Sarajane Jews  Everett Graff, MD   03/26/2021 5:08 PM Medical Oncology and Hematology [facility] [place], [place] [address] Tel. [phone]    Fax. [phone]    I, Wilburn Mylar, am acting as scribe for Dr. Virgie Dad. Kiwanna Spraker.  I, Lurline Del MD, have reviewed the above documentation for accuracy and completeness, and I agree with the above.    *Total Encounter Time as defined by the Centers for Medicare and Medicaid Services  includes, in addition to the face-to-face time of a patient visit (documented in the note above) non-face-to-face time: obtaining and reviewing outside history, ordering and reviewing medications, tests or procedures, care coordination (communications with other health care professionals or caregivers) and documentation in the medical record.

## 2021-03-26 ENCOUNTER — Inpatient Hospital Stay: Payer: Medicare HMO | Attending: Oncology

## 2021-03-26 ENCOUNTER — Encounter: Payer: Self-pay | Admitting: Oncology

## 2021-03-26 ENCOUNTER — Inpatient Hospital Stay (HOSPITAL_BASED_OUTPATIENT_CLINIC_OR_DEPARTMENT_OTHER): Payer: Medicare HMO | Admitting: Oncology

## 2021-03-28 ENCOUNTER — Other Ambulatory Visit: Payer: Medicare HMO

## 2021-03-28 ENCOUNTER — Ambulatory Visit: Payer: Medicare HMO | Admitting: Oncology

## 2021-03-28 ENCOUNTER — Other Ambulatory Visit: Payer: Self-pay

## 2021-03-28 DIAGNOSIS — L2082 Flexural eczema: Secondary | ICD-10-CM

## 2021-03-28 MED ORDER — TRIAMCINOLONE ACETONIDE 0.5 % EX OINT: 1.0000 "application " | TOPICAL_OINTMENT | Freq: Two times a day (BID) | CUTANEOUS | 2 refills | Status: DC

## 2021-03-31 ENCOUNTER — Other Ambulatory Visit: Payer: Self-pay

## 2021-03-31 ENCOUNTER — Inpatient Hospital Stay (HOSPITAL_COMMUNITY)
Admission: EM | Admit: 2021-03-31 | Discharge: 2021-04-04 | DRG: 391 | Disposition: A | Discharge status: Home / Self Care | Payer: Medicare HMO | Attending: Family Medicine | Admitting: Family Medicine

## 2021-03-31 ENCOUNTER — Emergency Department (HOSPITAL_COMMUNITY): Payer: Medicare HMO

## 2021-03-31 ENCOUNTER — Encounter (HOSPITAL_COMMUNITY): Payer: Self-pay

## 2021-03-31 DIAGNOSIS — R21 Rash and other nonspecific skin eruption: Secondary | ICD-10-CM | POA: Diagnosis present

## 2021-03-31 DIAGNOSIS — E860 Dehydration: Secondary | ICD-10-CM

## 2021-03-31 DIAGNOSIS — Z20822 Contact with and (suspected) exposure to covid-19: Secondary | ICD-10-CM | POA: Diagnosis present

## 2021-03-31 DIAGNOSIS — I959 Hypotension, unspecified: Secondary | ICD-10-CM | POA: Diagnosis present

## 2021-03-31 DIAGNOSIS — Z803 Family history of malignant neoplasm of breast: Secondary | ICD-10-CM

## 2021-03-31 DIAGNOSIS — K760 Fatty (change of) liver, not elsewhere classified: Secondary | ICD-10-CM | POA: Diagnosis not present

## 2021-03-31 DIAGNOSIS — E43 Unspecified severe protein-calorie malnutrition: Secondary | ICD-10-CM | POA: Diagnosis not present

## 2021-03-31 DIAGNOSIS — R112 Nausea with vomiting, unspecified: Secondary | ICD-10-CM | POA: Diagnosis present

## 2021-03-31 DIAGNOSIS — Z800 Family history of malignant neoplasm of digestive organs: Secondary | ICD-10-CM | POA: Diagnosis not present

## 2021-03-31 DIAGNOSIS — K2900 Acute gastritis without bleeding: Secondary | ICD-10-CM

## 2021-03-31 DIAGNOSIS — R339 Retention of urine, unspecified: Secondary | ICD-10-CM | POA: Diagnosis present

## 2021-03-31 DIAGNOSIS — Z8249 Family history of ischemic heart disease and other diseases of the circulatory system: Secondary | ICD-10-CM

## 2021-03-31 DIAGNOSIS — L97829 Non-pressure chronic ulcer of other part of left lower leg with unspecified severity: Secondary | ICD-10-CM | POA: Diagnosis present

## 2021-03-31 DIAGNOSIS — K219 Gastro-esophageal reflux disease without esophagitis: Secondary | ICD-10-CM | POA: Diagnosis present

## 2021-03-31 DIAGNOSIS — S81802A Unspecified open wound, left lower leg, initial encounter: Secondary | ICD-10-CM | POA: Diagnosis present

## 2021-03-31 DIAGNOSIS — Z807 Family history of other malignant neoplasms of lymphoid, hematopoietic and related tissues: Secondary | ICD-10-CM

## 2021-03-31 DIAGNOSIS — E861 Hypovolemia: Secondary | ICD-10-CM | POA: Diagnosis present

## 2021-03-31 DIAGNOSIS — I779 Disorder of arteries and arterioles, unspecified: Secondary | ICD-10-CM | POA: Diagnosis not present

## 2021-03-31 DIAGNOSIS — R197 Diarrhea, unspecified: Secondary | ICD-10-CM

## 2021-03-31 DIAGNOSIS — I739 Peripheral vascular disease, unspecified: Secondary | ICD-10-CM | POA: Diagnosis present

## 2021-03-31 DIAGNOSIS — I471 Supraventricular tachycardia: Secondary | ICD-10-CM | POA: Diagnosis present

## 2021-03-31 DIAGNOSIS — R000 Tachycardia, unspecified: Secondary | ICD-10-CM | POA: Diagnosis not present

## 2021-03-31 DIAGNOSIS — I10 Essential (primary) hypertension: Secondary | ICD-10-CM | POA: Diagnosis not present

## 2021-03-31 DIAGNOSIS — Z89421 Acquired absence of other right toe(s): Secondary | ICD-10-CM

## 2021-03-31 DIAGNOSIS — K529 Noninfective gastroenteritis and colitis, unspecified: Secondary | ICD-10-CM | POA: Diagnosis present

## 2021-03-31 DIAGNOSIS — R109 Unspecified abdominal pain: Secondary | ICD-10-CM | POA: Diagnosis not present

## 2021-03-31 DIAGNOSIS — K2970 Gastritis, unspecified, without bleeding: Secondary | ICD-10-CM | POA: Diagnosis not present

## 2021-03-31 DIAGNOSIS — Z8261 Family history of arthritis: Secondary | ICD-10-CM

## 2021-03-31 DIAGNOSIS — Z6824 Body mass index (BMI) 24.0-24.9, adult: Secondary | ICD-10-CM

## 2021-03-31 HISTORY — DX: Nausea with vomiting, unspecified: R11.2

## 2021-03-31 LAB — CBC WITH DIFFERENTIAL/PLATELET
Abs Immature Granulocytes: 0.04 10*3/uL (ref 0.00–0.07)
Basophils Absolute: 0 10*3/uL (ref 0.0–0.1)
Basophils Relative: 1 %
Eosinophils Absolute: 0 10*3/uL (ref 0.0–0.5)
Eosinophils Relative: 1 %
HCT: 39.5 % (ref 36.0–46.0)
Hemoglobin: 13.6 g/dL (ref 12.0–15.0)
Immature Granulocytes: 1 %
Lymphocytes Relative: 18 %
Lymphs Abs: 0.7 10*3/uL (ref 0.7–4.0)
MCH: 34.6 pg — ABNORMAL HIGH (ref 26.0–34.0)
MCHC: 34.4 g/dL (ref 30.0–36.0)
MCV: 100.5 fL — ABNORMAL HIGH (ref 80.0–100.0)
Monocytes Absolute: 0.2 10*3/uL (ref 0.1–1.0)
Monocytes Relative: 5 %
Neutro Abs: 3 10*3/uL (ref 1.7–7.7)
Neutrophils Relative %: 74 %
Platelets: 169 10*3/uL (ref 150–400)
RBC: 3.93 MIL/uL (ref 3.87–5.11)
RDW: 16.4 % — ABNORMAL HIGH (ref 11.5–15.5)
WBC: 4 10*3/uL (ref 4.0–10.5)
nRBC: 0 % (ref 0.0–0.2)

## 2021-03-31 LAB — BASIC METABOLIC PANEL
Anion gap: 11 (ref 5–15)
BUN: 12 mg/dL (ref 8–23)
CO2: 22 mmol/L (ref 22–32)
Calcium: 8.2 mg/dL — ABNORMAL LOW (ref 8.9–10.3)
Chloride: 104 mmol/L (ref 98–111)
Creatinine, Ser: 0.67 mg/dL (ref 0.44–1.00)
GFR, Estimated: >60 mL/min (ref >60)
Glucose, Bld: 94 mg/dL (ref 70–99)
Potassium: 3.7 mmol/L (ref 3.5–5.1)
Sodium: 137 mmol/L (ref 135–145)

## 2021-03-31 LAB — URINALYSIS, ROUTINE W REFLEX MICROSCOPIC
Bacteria, UA: NONE SEEN
Bilirubin Urine: NEGATIVE
Glucose, UA: NEGATIVE mg/dL
Hgb urine dipstick: NEGATIVE
Ketones, ur: NEGATIVE mg/dL
Nitrite: NEGATIVE
Protein, ur: NEGATIVE mg/dL
Specific Gravity, Urine: 1.014 (ref 1.005–1.030)
pH: 5 (ref 5.0–8.0)

## 2021-03-31 LAB — MAGNESIUM
Magnesium: 1.4 mg/dL — ABNORMAL LOW (ref 1.7–2.4)
Magnesium: 1.6 mg/dL — ABNORMAL LOW (ref 1.7–2.4)

## 2021-03-31 LAB — RESP PANEL BY RT-PCR (FLU A&B, COVID) ARPGX2
Influenza A by PCR: NEGATIVE
Influenza B by PCR: NEGATIVE
SARS Coronavirus 2 by RT PCR: NEGATIVE

## 2021-03-31 LAB — TROPONIN I (HIGH SENSITIVITY)
Troponin I (High Sensitivity): 12 ng/L (ref <18)
Troponin I (High Sensitivity): 12 ng/L (ref <18)

## 2021-03-31 LAB — HEPATIC FUNCTION PANEL
ALT: 47 U/L — ABNORMAL HIGH (ref 0–44)
AST: 39 U/L (ref 15–41)
Albumin: 2.6 g/dL — ABNORMAL LOW (ref 3.5–5.0)
Alkaline Phosphatase: 119 U/L (ref 38–126)
Bilirubin, Direct: 0.4 mg/dL — ABNORMAL HIGH (ref 0.0–0.2)
Indirect Bilirubin: 0.7 mg/dL (ref 0.3–0.9)
Total Bilirubin: 1.1 mg/dL (ref 0.3–1.2)
Total Protein: 6 g/dL — ABNORMAL LOW (ref 6.5–8.1)

## 2021-03-31 LAB — TSH: TSH: 3.19 u[IU]/mL (ref 0.350–4.500)

## 2021-03-31 LAB — LIPASE, BLOOD: Lipase: 20 U/L (ref 11–51)

## 2021-03-31 IMAGING — CT CT ABD-PELV W/ CM
2 of 5 series · 16 of 46 positions shown, 18 images · IV contrast (OMNIPAQUE 350)
Comparison: Abdominal ultrasound dated [DATE].

CLINICAL DATA: Acute abdominal pain.

EXAM:
CT ABDOMEN AND PELVIS WITH CONTRAST
TECHNIQUE: Multidetector CT imaging of the abdomen and pelvis was performed
using the standard protocol following bolus administration of
intravenous contrast.
CONTRAST:  75mL OMNIPAQUE IOHEXOL 350 MG/ML SOLN

[Series 2: axial st · axial · 0.72mm/px · z∈[-446,-126]mm · 13 of 74 slices shown, 15 images]
[im 5/74  soft-tissue]
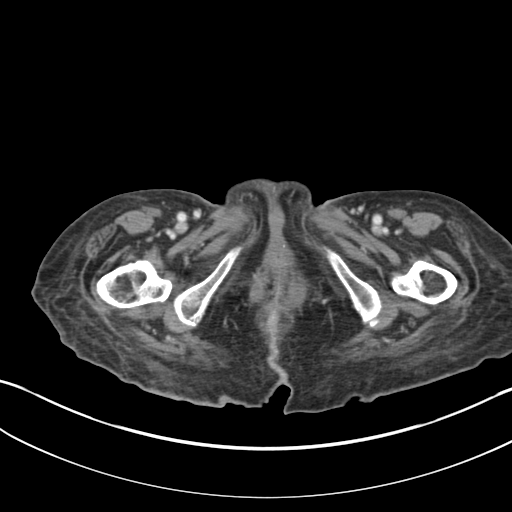
[im 5/74  bone]
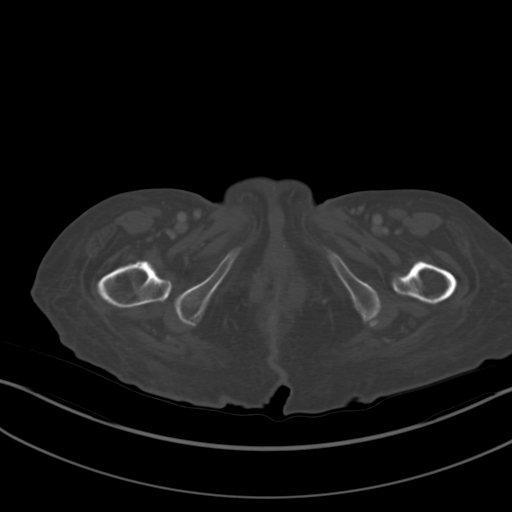
[im 10/74  soft-tissue]
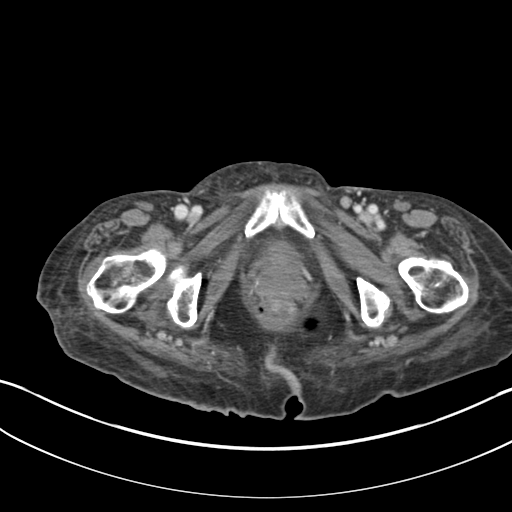
[im 15/74  soft-tissue]
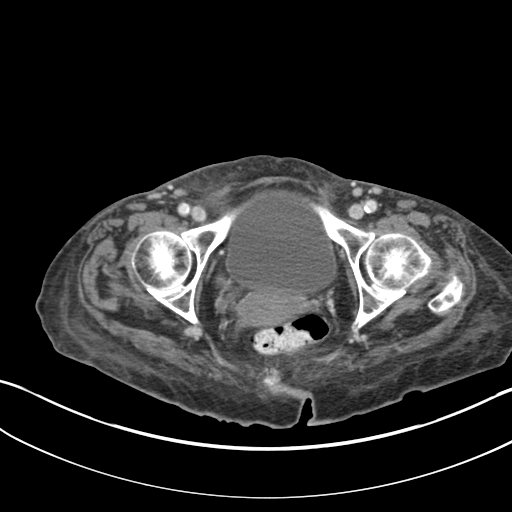
[im 20/74  soft-tissue]
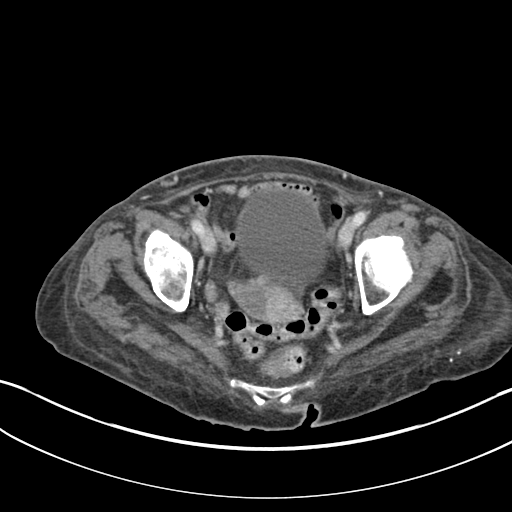
[im 25/74  soft-tissue]
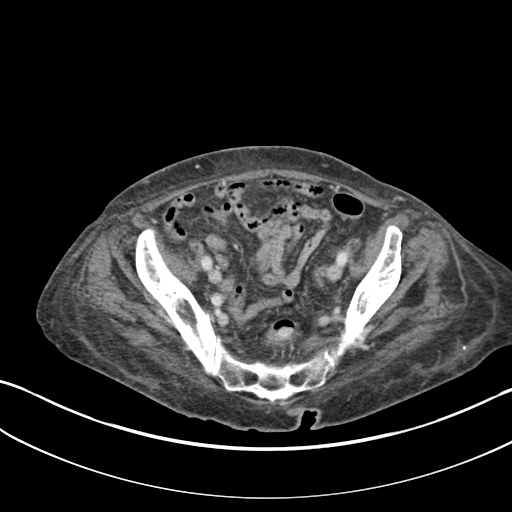
[im 30/74  soft-tissue]
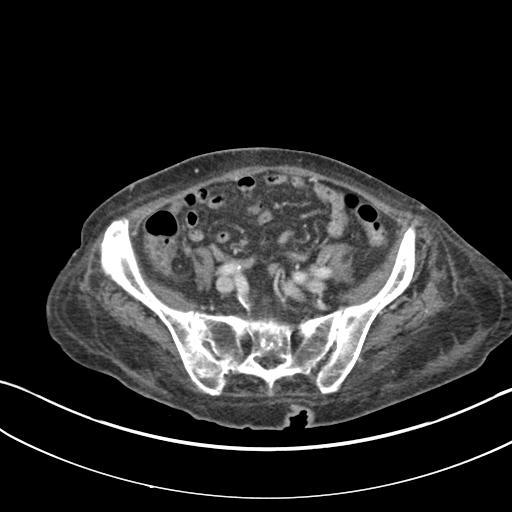
[im 39/74  soft-tissue]
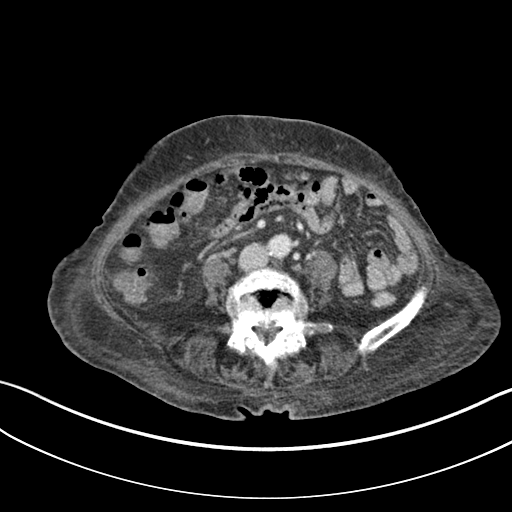
[im 44/74  soft-tissue]
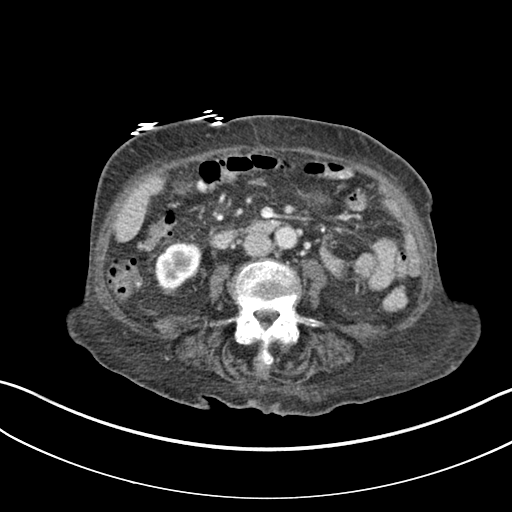
[im 49/74  soft-tissue]
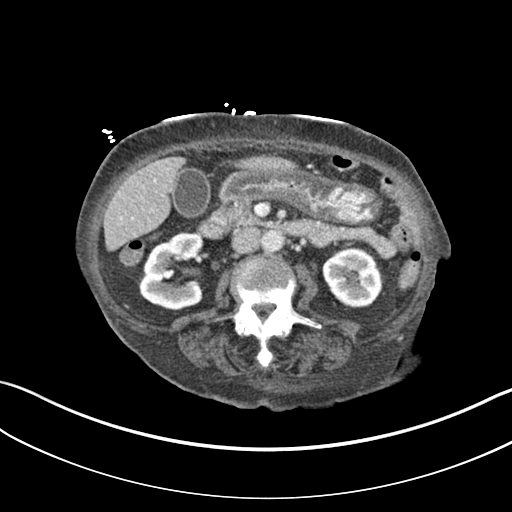
[im 49/74  bone]
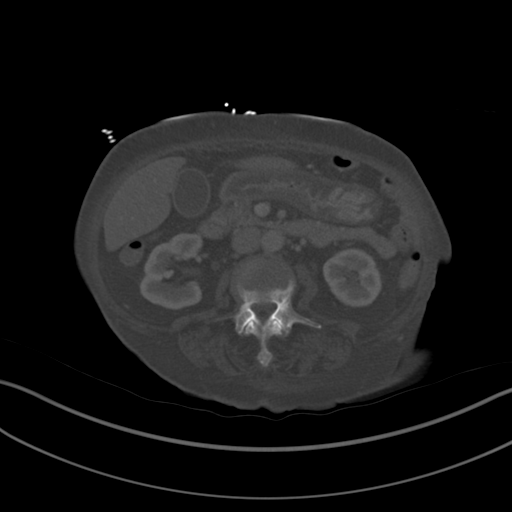
[im 54/74  soft-tissue]
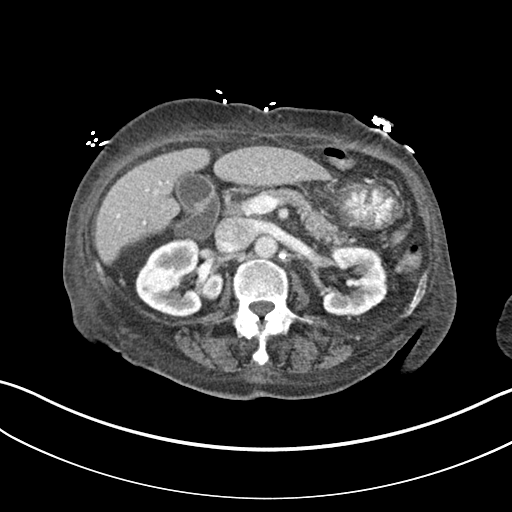
[im 59/74  soft-tissue]
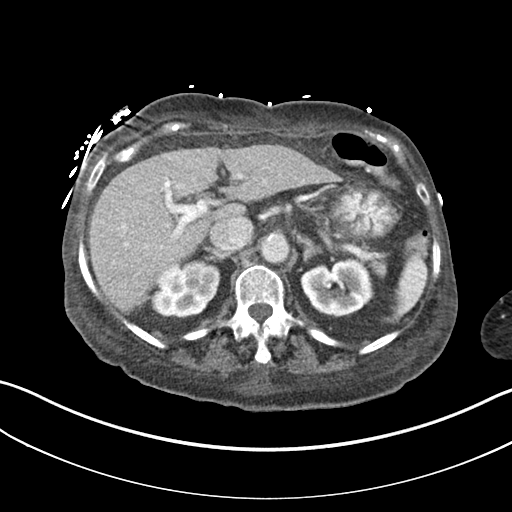
[im 64/74  soft-tissue]
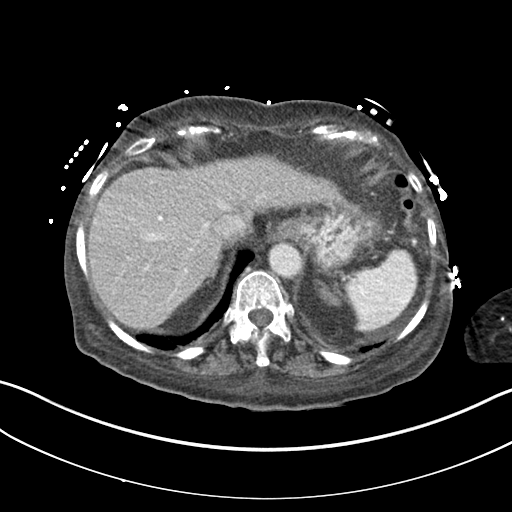
[im 69/74  soft-tissue]
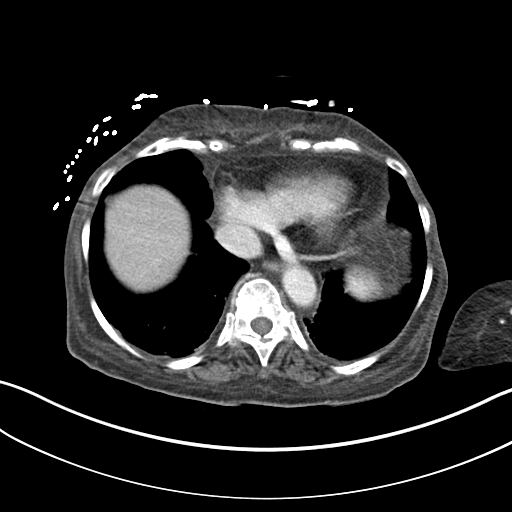

[Series 5: coronal st · coronal · 0.70mm/px · 3 of 118 slices shown]
[im 40/118  soft-tissue]
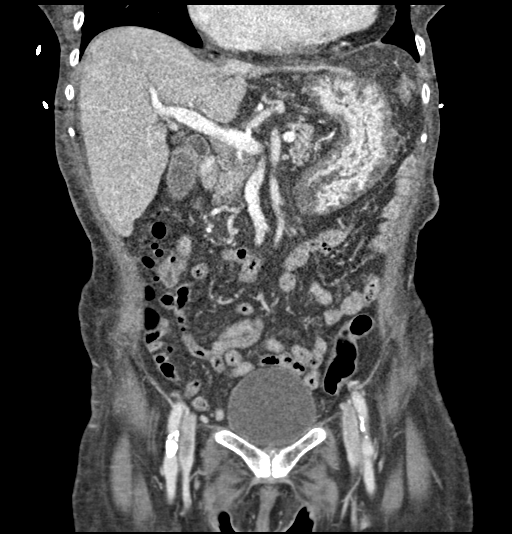
[im 53/118  soft-tissue]
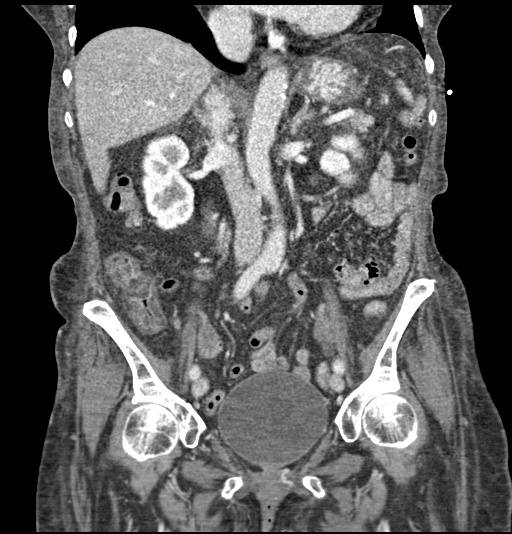
[im 66/118  soft-tissue]
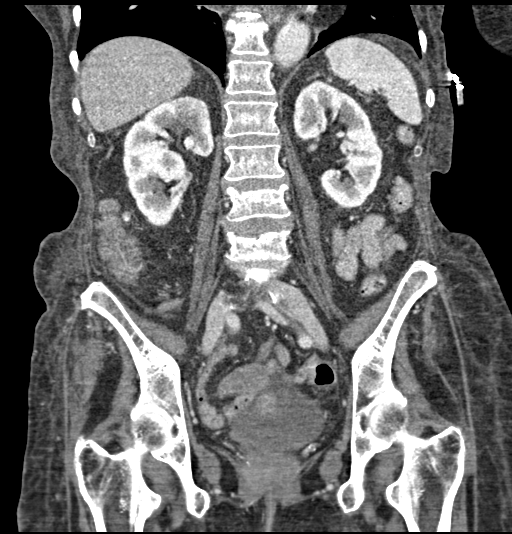

[16 of 46 positions shown; findings below may reference images not displayed]

FINDINGS: Lower chest: There is mild bibasilar atelectasis. The heart is
enlarged.

Hepatobiliary: No focal liver abnormality is seen. No gallstones,
gallbladder wall thickening, or biliary dilatation.

Pancreas: Unremarkable. No pancreatic ductal dilatation or
surrounding inflammatory changes.

Spleen: Normal in size without focal abnormality.

Adrenals/Urinary Tract: Adrenal glands are unremarkable. Kidneys are
normal, without renal calculi, focal lesion, or hydronephrosis.
Bladder is unremarkable.

Stomach/Bowel: There is edema and mild thickening of the wall of the
gastric fundus with hyperenhancement of the mucosa which may
represent gastritis. Appendix appears normal. There is colonic
diverticulosis without evidence of diverticulitis. No evidence of
bowel wall thickening, distention, or inflammatory changes.

Vascular/Lymphatic: Aortic atherosclerosis. No enlarged abdominal or
pelvic lymph nodes.

Reproductive: A mass in the left aspect of the uterus measures 3.0 x
2.5 cm. The adnexa appear normal.

Other: No abdominal wall hernia or abnormality. No abdominopelvic
ascites. Anasarca and diffuse muscle wasting are noted.

Musculoskeletal: There are chronic appearing compression deformities
with approximately 25% height loss at L3 and 50% height loss at L4.
Degenerative changes are seen in the spine.
IMPRESSION: 1. Findings which may reflect gastritis.
2. Mass in the left aspect of the uterus likely represents a uterine
fibroid.
3. Chronic appearing compression deformities of L3 and L4.

Aortic Atherosclerosis ([8F]-[8F]).

## 2021-03-31 MED ORDER — SERTRALINE HCL 25 MG PO TABS
25.0000 mg | ORAL_TABLET | Freq: Every day | ORAL | Status: DC
  Administered 2021-04-01 – 2021-04-04 (×4): 25 mg via ORAL
  Filled 2021-03-31 (×4): qty 1

## 2021-03-31 MED ORDER — LACTATED RINGERS IV BOLUS
1000.0000 mL | Freq: Once | INTRAVENOUS | Status: AC
  Administered 2021-03-31: 1000 mL via INTRAVENOUS

## 2021-03-31 MED ORDER — IOHEXOL 350 MG/ML SOLN
75.0000 mL | Freq: Once | INTRAVENOUS | Status: AC | PRN
  Administered 2021-03-31: 75 mL via INTRAVENOUS

## 2021-03-31 MED ORDER — MAGNESIUM SULFATE IN D5W 1-5 GM/100ML-% IV SOLN
1.0000 g | Freq: Once | INTRAVENOUS | Status: AC
  Administered 2021-03-31: 1 g via INTRAVENOUS
  Filled 2021-03-31: qty 100

## 2021-03-31 MED ORDER — MAGNESIUM OXIDE -MG SUPPLEMENT 400 (240 MG) MG PO TABS
400.0000 mg | ORAL_TABLET | Freq: Once | ORAL | Status: AC
  Administered 2021-03-31: 400 mg via ORAL
  Filled 2021-03-31: qty 1

## 2021-03-31 MED ORDER — PANTOPRAZOLE SODIUM 40 MG PO TBEC
40.0000 mg | DELAYED_RELEASE_TABLET | Freq: Two times a day (BID) | ORAL | Status: DC
  Administered 2021-03-31: 40 mg via ORAL
  Filled 2021-03-31: qty 1

## 2021-03-31 MED ORDER — TRIAMCINOLONE ACETONIDE 0.5 % EX OINT
1.0000 "application " | TOPICAL_OINTMENT | Freq: Two times a day (BID) | CUTANEOUS | Status: DC | PRN
  Administered 2021-04-01 (×2): 1 via TOPICAL
  Filled 2021-03-31 (×2): qty 15

## 2021-03-31 MED ORDER — ENOXAPARIN SODIUM 40 MG/0.4ML IJ SOSY: 40.0000 mg | PREFILLED_SYRINGE | INTRAMUSCULAR | Status: DC

## 2021-03-31 MED ORDER — MORPHINE SULFATE (PF) 2 MG/ML IV SOLN
2.0000 mg | INTRAVENOUS | Status: DC | PRN
  Administered 2021-04-01: 2 mg via INTRAVENOUS
  Administered 2021-04-01 (×2): 4 mg via INTRAVENOUS
  Administered 2021-04-04: 2 mg via INTRAVENOUS
  Filled 2021-03-31 (×2): qty 2
  Filled 2021-03-31 (×2): qty 1

## 2021-03-31 MED ORDER — LACTATED RINGERS IV SOLN: INTRAVENOUS | Status: DC

## 2021-03-31 MED ORDER — ONDANSETRON HCL 4 MG PO TABS: 4.0000 mg | ORAL_TABLET | Freq: Four times a day (QID) | ORAL | Status: DC | PRN

## 2021-03-31 MED ORDER — ALUM & MAG HYDROXIDE-SIMETH 200-200-20 MG/5ML PO SUSP
30.0000 mL | Freq: Once | ORAL | Status: AC
  Administered 2021-03-31: 30 mL via ORAL
  Filled 2021-03-31: qty 30

## 2021-03-31 MED ORDER — DIPHENHYDRAMINE HCL 25 MG PO CAPS
50.0000 mg | ORAL_CAPSULE | Freq: Four times a day (QID) | ORAL | Status: DC | PRN
  Filled 2021-03-31: qty 2

## 2021-03-31 MED ORDER — AMLODIPINE BESYLATE 5 MG PO TABS: 2.5000 mg | ORAL_TABLET | Freq: Every day | ORAL | Status: DC

## 2021-03-31 MED ORDER — POTASSIUM CHLORIDE CRYS ER 20 MEQ PO TBCR: 20.0000 meq | EXTENDED_RELEASE_TABLET | Freq: Every day | ORAL | Status: DC

## 2021-03-31 MED ORDER — ENOXAPARIN SODIUM 40 MG/0.4ML IJ SOSY
40.0000 mg | PREFILLED_SYRINGE | INTRAMUSCULAR | Status: DC
  Administered 2021-04-01 – 2021-04-04 (×4): 40 mg via SUBCUTANEOUS
  Filled 2021-03-31 (×4): qty 0.4

## 2021-03-31 MED ORDER — MAGNESIUM SULFATE IN D5W 1-5 GM/100ML-% IV SOLN: 1.0000 g | Freq: Once | INTRAVENOUS | Status: DC

## 2021-03-31 MED ORDER — METOPROLOL TARTRATE 5 MG/5ML IV SOLN
5.0000 mg | Freq: Once | INTRAVENOUS | Status: AC
  Administered 2021-03-31: 5 mg via INTRAVENOUS
  Filled 2021-03-31: qty 5

## 2021-03-31 MED ORDER — MAGNESIUM OXIDE -MG SUPPLEMENT 400 (240 MG) MG PO TABS
400.0000 mg | ORAL_TABLET | Freq: Every evening | ORAL | Status: DC
  Administered 2021-04-01 – 2021-04-04 (×4): 400 mg via ORAL
  Filled 2021-03-31 (×4): qty 1

## 2021-03-31 MED ORDER — PANTOPRAZOLE SODIUM 40 MG PO TBEC: 40.0000 mg | DELAYED_RELEASE_TABLET | Freq: Every day | ORAL | Status: DC

## 2021-03-31 MED ORDER — ADULT MULTIVITAMIN W/MINERALS CH
1.0000 | ORAL_TABLET | Freq: Every day | ORAL | Status: DC
  Administered 2021-04-02 – 2021-04-04 (×3): 1 via ORAL
  Filled 2021-03-31 (×3): qty 1

## 2021-03-31 MED ORDER — LIDOCAINE VISCOUS HCL 2 % MT SOLN
15.0000 mL | Freq: Once | OROMUCOSAL | Status: AC
  Administered 2021-03-31: 15 mL via ORAL
  Filled 2021-03-31: qty 15

## 2021-03-31 MED ORDER — METOPROLOL TARTRATE 25 MG PO TABS
25.0000 mg | ORAL_TABLET | Freq: Once | ORAL | Status: AC
  Administered 2021-03-31: 25 mg via ORAL
  Filled 2021-03-31: qty 1

## 2021-03-31 MED ORDER — ONDANSETRON HCL 4 MG/2ML IJ SOLN
4.0000 mg | Freq: Once | INTRAMUSCULAR | Status: AC
  Administered 2021-03-31: 4 mg via INTRAVENOUS
  Filled 2021-03-31: qty 2

## 2021-03-31 MED ORDER — HYDROXYZINE HCL 25 MG PO TABS
25.0000 mg | ORAL_TABLET | Freq: Once | ORAL | Status: AC
  Administered 2021-03-31: 25 mg via ORAL
  Filled 2021-03-31: qty 1

## 2021-03-31 MED ORDER — ONDANSETRON HCL 4 MG/2ML IJ SOLN
4.0000 mg | Freq: Four times a day (QID) | INTRAMUSCULAR | Status: DC | PRN
  Administered 2021-04-04: 4 mg via INTRAVENOUS
  Filled 2021-03-31: qty 2

## 2021-03-31 MED ORDER — METOPROLOL TARTRATE 25 MG PO TABS: 25.0000 mg | ORAL_TABLET | Freq: Once | ORAL | Status: DC

## 2021-03-31 NOTE — ED Notes (Signed)
Contacted attending MD to request PRN pain medication due to pt reporting 8/10 lower abd pain.

## 2021-03-31 NOTE — ED Triage Notes (Signed)
Pt arrived via POV, c/o vomiting on and off for about a month. Also c/o rash on entire body.

## 2021-03-31 NOTE — ED Notes (Signed)
Pt heart rate 120-140 md aware.

## 2021-03-31 NOTE — ED Provider Notes (Signed)
[facility] DEPT Provider Note   CSN: [id number] Arrival date & time: 03/31/21  1136     History Chief Complaint  Patient presents with   Emesis    Brittiny Levitz is a 70 y.o. female.   Emesis Associated symptoms: abdominal pain and diarrhea   Associated symptoms: no arthralgias, no chills, no cough, no fever and no sore throat    The patient is a 70 year old female with a history of iron overload of unclear etiology presenting to the emergency department with 3 weeks of nausea, vomiting and some loose stools.  The patient has been unable to tolerate oral intake for the past several days prompting her presentation to the emergency department today.  She feels dehydrated.  She was supposed to follow-up with Dr. Jana Hakim of oncology for iron overload of unclear etiology.  She was evaluated in the hematology clinic on 02/13/2021 at which time her iron saturation was greater than 91% and her ferritin level was 1450.  She had a work-up outpatient which revealed no evidence of hemochromatosis.  She was found to have mild liver abnormalities consistent with steatosis.  She was not on iron supplementations.  It was thought that she possibly could have a hard to document rare genetic defect and iron handling.  She presents with persistent symptoms of nausea, vomiting, moderate intensity right upper quadrant and epigastric abdominal pain.  The pain is burning, sharp and nonradiating.  It is worse with retching.  Nothing makes it better.  Past Medical History:  Diagnosis Date   Fatty liver    GERD (gastroesophageal reflux disease)    Hypertension    Osteomyelitis ([place])    right third toe   Peripheral vascular disease ([place])    Ulcer 08/14/20    Patient Active Problem List   Diagnosis Date Noted   Gastritis 03/31/2021   Nausea and vomiting 03/31/2021   Iron overload 02/13/2021   Neuropathy 02/13/2021   Elevated LFTs 02/13/2021   Weight loss,  non-intentional 02/13/2021   Falls frequently 02/13/2021   GI bleed 08/13/2020   UGIB (upper gastrointestinal bleed) 08/12/2020   Depression 08/12/2020   Hypokalemia 08/12/2020   Essential hypertension, benign 03/23/2019    Past Surgical History:  Procedure Laterality Date   AMPUTATION Right 08/08/2017   Procedure: AMPUTATION RIGHT 3RD TOE;  Surgeon: Newt Minion, MD;  Location: [facility];  Service: Orthopedics;  Laterality: Right;   BIOPSY  08/14/2020   Procedure: BIOPSY;  Surgeon: Clarene Essex, MD;  Location: [facility];  Service: Endoscopy;;   Bunionectomy Right 2006     Right   Bunionectony Left 2006      August   COLONOSCOPY N/A 01/21/2014   Procedure: COLONOSCOPY;  Surgeon: Beryle Beams, MD;  Location: [facility];  Service: Endoscopy;  Laterality: N/A;   ESOPHAGOGASTRODUODENOSCOPY (EGD) WITH PROPOFOL N/A 08/14/2020   Procedure: ESOPHAGOGASTRODUODENOSCOPY (EGD) WITH PROPOFOL;  Surgeon: Clarene Essex, MD;  Location: [facility];  Service: Endoscopy;  Laterality: N/A;   TUBAL LIGATION     WISDOM TOOTH EXTRACTION       OB History   No obstetric history on file.     Family History  Problem Relation Age of Onset   Breast cancer Mother    Colon cancer Mother    Heart Problems Mother    Hypertension Mother    Multiple myeloma Mother    Arthritis Mother    Cancer Mother    Heart disease Mother    Hypertension Father  Heart disease Father    Multiple myeloma Father    Arthritis Father    Cancer Father     Social History   Tobacco Use   Smoking status: Never   Smokeless tobacco: Never  Vaping Use   Vaping Use: Never used  Substance Use Topics   Alcohol use: Yes    Alcohol/week: 5.0 standard drinks    Types: 5 Glasses of wine per week    Comment: social glass of wine   Drug use: No    Home Medications Prior to Admission medications   Medication Sig Start Date End Date Taking? Authorizing Provider  acetaminophen (TYLENOL) 650 MG CR tablet Take 650 mg by  mouth every 8 (eight) hours as needed for pain.   Yes [provider]  amLODipine (NORVASC) 5 MG tablet Take 1 tablet (5 mg total) by mouth daily. Patient taking differently: Take 2.5 mg by mouth daily. 02/01/21  Yes Ghumman, Ramandeep, NP  Caffeine-Magnesium Salicylate (DIUREX PO) Take 2 tablets by mouth in the morning and at bedtime.   Yes [provider]  Cholecalciferol (VITAMIN D3) 5000 units CAPS Take 5,000 Units by mouth daily.   Yes [provider]  diphenhydrAMINE (BENADRYL) 25 MG tablet Take 50 mg by mouth every 6 (six) hours as needed for itching.   Yes [provider]  Magnesium 400 MG CAPS Take 1 capsule by mouth daily in the evening. Patient taking differently: Take 400 mg by mouth every evening. 02/08/21  Yes Ghumman, Ramandeep, NP  Multiple Vitamin (MULTIVITAMIN WITH MINERALS) TABS tablet Take 1 tablet by mouth daily.   Yes [provider]  pantoprazole (PROTONIX) 40 MG tablet Take 1 tablet (40 mg total) by mouth 2 (two) times daily before a meal. Take $RemoveBe'40mg'nccUaLZVV$  twice daily for 2 weeks, then decrease to $RemoveBef'40mg'wCAmzCbgjh$  once daily Patient taking differently: Take 40 mg by mouth daily. 08/15/20  Yes Dessa Phi, DO  potassium chloride SA (KLOR-CON) 20 MEQ tablet Take 1 tablet (20 mEq total) by mouth 2 (two) times daily. Patient taking differently: Take 20 mEq by mouth daily. 02/14/21  Yes Magrinat, Virgie Dad, MD  sertraline (ZOLOFT) 25 MG tablet Take 1 tablet (25 mg total) by mouth daily. 02/01/21  Yes Ghumman, Ramandeep, NP  Tetrahydrozoline HCl (VISINE OP) Place 2 drops into both eyes daily as needed (for dry/irritated eyes).    Yes [provider]  triamcinolone ointment (KENALOG) 0.5 % Apply 1 application topically 2 (two) times daily. Patient taking differently: Apply 1 application topically 2 (two) times daily as needed (rash/itching). 03/28/21  Yes Glendale Chard, MD    Allergies    Patient has no known allergies.  Review of Systems   Review  of Systems  Constitutional:  Positive for appetite change and fatigue. Negative for chills and fever.  HENT:  Negative for ear pain and sore throat.   Eyes:  Negative for pain and visual disturbance.  Respiratory:  Negative for cough and shortness of breath.   Cardiovascular:  Negative for chest pain and palpitations.  Gastrointestinal:  Positive for abdominal pain, diarrhea, nausea and vomiting.  Genitourinary:  Negative for dysuria and hematuria.  Musculoskeletal:  Negative for arthralgias and back pain.  Skin:  Positive for rash and wound. Negative for color change.  Neurological:  Negative for seizures and syncope.  All other systems reviewed and are negative.  Physical Exam Updated Vital Signs BP (!) 159/101   Pulse (!) 106   Temp 97.8 F (36.6 C) (Oral)  Resp 12   SpO2 99%   Physical Exam Vitals and nursing note reviewed.  Constitutional:      General: She is not in acute distress.    Appearance: She is well-developed.     Comments: Resting comfortably, in NAD  HENT:     Head: Normocephalic and atraumatic.     Mouth/Throat:     Mouth: Mucous membranes are dry.  Eyes:     Conjunctiva/sclera: Conjunctivae normal.  Cardiovascular:     Rate and Rhythm: Normal rate and regular rhythm.     Heart sounds: No murmur heard. Pulmonary:     Effort: Pulmonary effort is normal. No respiratory distress.     Breath sounds: Normal breath sounds.  Chest:     Comments: Excoriations present along the patient's chest wall Abdominal:     Palpations: Abdomen is soft.     Tenderness: There is abdominal tenderness.     Comments: Excoriations presenting along the patient's abdominal wall. Diffuse mild TTP  Musculoskeletal:     Cervical back: Neck supple.     Right lower leg: Edema present.     Left lower leg: Edema present.     Comments: 2+ distal pulses. 1+ bilateral pitting edema  Skin:    General: Skin is warm and dry.  Neurological:     General: No focal deficit present.      Mental Status: She is alert and oriented to person, place, and time. Mental status is at baseline.    ED Results / Procedures / Treatments   Labs (all labs ordered are listed, but only abnormal results are displayed) Labs Reviewed  CBC WITH DIFFERENTIAL/PLATELET - Abnormal; Notable for the following components:      Result Value   MCV 100.5 (*)    MCH 34.6 (*)    RDW 16.4 (*)    All other components within normal limits  URINALYSIS, ROUTINE W REFLEX MICROSCOPIC - Abnormal; Notable for the following components:   Color, Urine STRAW (*)    Leukocytes,Ua SMALL (*)    All other components within normal limits  BASIC METABOLIC PANEL - Abnormal; Notable for the following components:   Calcium 8.2 (*)    All other components within normal limits  HEPATIC FUNCTION PANEL - Abnormal; Notable for the following components:   Total Protein 6.0 (*)    Albumin 2.6 (*)    ALT 47 (*)    Bilirubin, Direct 0.4 (*)    All other components within normal limits  MAGNESIUM - Abnormal; Notable for the following components:   Magnesium 1.4 (*)    All other components within normal limits  MAGNESIUM - Abnormal; Notable for the following components:   Magnesium 1.6 (*)    All other components within normal limits  RESP PANEL BY RT-PCR (FLU A&B, COVID) ARPGX2  C DIFFICILE QUICK SCREEN W PCR REFLEX    GASTROINTESTINAL PANEL BY PCR, STOOL (REPLACES STOOL CULTURE)  LIPASE, BLOOD  TSH  ANA  MITOCHONDRIAL ANTIBODIES  CBC  COMPREHENSIVE METABOLIC PANEL  MAGNESIUM  TROPONIN I (HIGH SENSITIVITY)  TROPONIN I (HIGH SENSITIVITY)    EKG EKG Interpretation  Date/Time:  Saturday March 31 2021 13:46:38 EDT Ventricular Rate:  92 PR Interval:  123 QRS Duration: 83 QT Interval:  353 QTC Calculation: 437 R Axis:   -43 Text Interpretation: Sinus tachycardia Multiform ventricular premature complexes Left anterior fascicular block No STEMI Confirmed by Regan Lemming ([id number]) on 03/31/2021 2:00:38  PM  Radiology CT ABDOMEN PELVIS W CONTRAST  Result  Date: 03/31/2021 CLINICAL DATA:  Acute abdominal pain. EXAM: CT ABDOMEN AND PELVIS WITH CONTRAST TECHNIQUE: Multidetector CT imaging of the abdomen and pelvis was performed using the standard protocol following bolus administration of intravenous contrast. CONTRAST:  52mL OMNIPAQUE IOHEXOL 350 MG/ML SOLN COMPARISON:  Abdominal ultrasound dated 10/27/2015. FINDINGS: Lower chest: There is mild bibasilar atelectasis. The heart is enlarged. Hepatobiliary: No focal liver abnormality is seen. No gallstones, gallbladder wall thickening, or biliary dilatation. Pancreas: Unremarkable. No pancreatic ductal dilatation or surrounding inflammatory changes. Spleen: Normal in size without focal abnormality. Adrenals/Urinary Tract: Adrenal glands are unremarkable. Kidneys are normal, without renal calculi, focal lesion, or hydronephrosis. Bladder is unremarkable. Stomach/Bowel: There is edema and mild thickening of the wall of the gastric fundus with hyperenhancement of the mucosa which may represent gastritis. Appendix appears normal. There is colonic diverticulosis without evidence of diverticulitis. No evidence of bowel wall thickening, distention, or inflammatory changes. Vascular/Lymphatic: Aortic atherosclerosis. No enlarged abdominal or pelvic lymph nodes. Reproductive: A mass in the left aspect of the uterus measures 3.0 x 2.5 cm. The adnexa appear normal. Other: No abdominal wall hernia or abnormality. No abdominopelvic ascites. Anasarca and diffuse muscle wasting are noted. Musculoskeletal: There are chronic appearing compression deformities with approximately 25% height loss at L3 and 50% height loss at L4. Degenerative changes are seen in the spine. IMPRESSION: 1. Findings which may reflect gastritis. 2. Mass in the left aspect of the uterus likely represents a uterine fibroid. 3. Chronic appearing compression deformities of L3 and L4. Aortic Atherosclerosis  (ICD10-I70.0). Electronically Signed   By: Zerita Boers M.D.   On: 03/31/2021 16:18    Procedures Procedures   Medications Ordered in ED Medications  magnesium sulfate IVPB 1 g 100 mL (1 g Intravenous New Bag/Given 03/31/21 2042)  metoprolol tartrate (LOPRESSOR) tablet 25 mg (has no administration in time range)  lactated ringers infusion (has no administration in time range)  triamcinolone ointment (KENALOG) 0.5 % 1 application (has no administration in time range)  sertraline (ZOLOFT) tablet 25 mg (has no administration in time range)  potassium chloride SA (KLOR-CON) CR tablet 20 mEq (has no administration in time range)  Magnesium CAPS 400 mg (has no administration in time range)  diphenhydrAMINE (BENADRYL) tablet 50 mg (has no administration in time range)  multivitamin with minerals tablet 1 tablet (has no administration in time range)  amLODipine (NORVASC) tablet 2.5 mg (has no administration in time range)  ondansetron (ZOFRAN) tablet 4 mg (has no administration in time range)    Or  ondansetron (ZOFRAN) injection 4 mg (has no administration in time range)  pantoprazole (PROTONIX) EC tablet 40 mg (has no administration in time range)  enoxaparin (LOVENOX) injection 40 mg (has no administration in time range)  hydrOXYzine (ATARAX/VISTARIL) tablet 25 mg (25 mg Oral Given 03/31/21 1428)  lactated ringers bolus 1,000 mL (0 mLs Intravenous Stopped 03/31/21 1552)  ondansetron (ZOFRAN) injection 4 mg (4 mg Intravenous Given 03/31/21 1430)  iohexol (OMNIPAQUE) 350 MG/ML injection 75 mL (75 mLs Intravenous Contrast Given 03/31/21 1524)  magnesium sulfate IVPB 1 g 100 mL (0 g Intravenous Stopped 03/31/21 1839)  metoprolol tartrate (LOPRESSOR) injection 5 mg (5 mg Intravenous Given 03/31/21 1659)  magnesium oxide (MAG-OX) tablet 400 mg (400 mg Oral Given 03/31/21 1831)  alum & mag hydroxide-simeth (MAALOX/MYLANTA) 200-200-20 MG/5ML suspension 30 mL (30 mLs Oral Given 03/31/21 1832)    And   lidocaine (XYLOCAINE) 2 % viscous mouth solution 15 mL (15 mLs Oral Given 03/31/21 1832)  metoprolol tartrate (LOPRESSOR) injection 5 mg (5 mg Intravenous Given 03/31/21 2014)  lactated ringers bolus 1,000 mL (0 mLs Intravenous Paused 03/31/21 2043)    ED Course  I have reviewed the triage vital signs and the nursing notes.  Pertinent labs & imaging results that were available during my care of the patient were reviewed by me and considered in my medical decision making (see chart for details).    MDM Rules/Calculators/A&P                           70 year old female presenting to the emergency department with nausea, vomiting, diffuse pruritus in the setting of a recent diagnosis of iron overload by her oncologist.  The patient was post follow-up on 9/27 in clinic for this.  She reports that she was unable to because of her persistent symptoms of fatigue, nausea and vomiting, decreased oral intake.  On arrival, the patient was borderline tachycardic, acutely uncomfortable, covered in excoriations over her chest and abdomen on physical exam.  Symptoms are consistent with possible iron overload syndrome versus primary biliary cirrhosis.  Additionally consider gastroenterology other GI infectious etiology of the patient's presentation.  Her mucous membranes appeared dry.  She was afebrile and without symptoms of fever, chills, cough, congestion.  She endorsed mild generalized epigastric abdominal discomfort and had mild tenderness to palpation of the abdomen on exam.  Work-up initiated in the emergency department to include CBC, BMP, hepatic function panel, troponin, GI pathogen panel, C. difficile PCR, COVID-19 and influenza PCR, urinalysis, ANA and AMA, TSH.  CBC without a leukocytosis or anemia or platelet abnormality.  BMP without a significant electrolyte abnormality, mild hypocalcemia at 8.2, normal alkaline phosphatase, normal T bili, subtle electrolyte abnormality of a magnesium of  1.4.  Patient's magnesium was replaced orally and IV in the emergency department.  She was administered an IV fluid bolus.  She did develop what appears to be multifocal atrial tachycardia with multiple P wave morphologies on EKG and evidence of a supraventricular tachycardia.  The patient remained hemodynamically stable.  She responded well to metoprolol IV and was transitioned orally.  I spoke with on-call cardiology who stated that no further intervention is required at this time.  She can follow-up outpatient for her observed MAT.  Cardiology on-call reviewed the patient's EKGs and was in agreement of the diagnosis of MAT.  The patient failed a p.o. challenge in the emergency department.  She remained tachycardic.  I am concerned for dehydration and need for further fluid resuscitation.  Hospitalist medicine consulted for admission for observation for rehydration in the setting of likely iron overload syndrome.  Final Clinical Impression(s) / ED Diagnoses Final diagnoses:  Nausea vomiting and diarrhea  Hypomagnesemia  Multifocal atrial tachycardia (HCC)  Iron overload syndrome  Dehydration    Rx / DC Orders ED Discharge Orders     None        Regan Lemming, MD 03/31/21 2129

## 2021-03-31 NOTE — H&P (Addendum)
History and Physical    Shahrzad Koble OBS:[id number] DOB: 1951-04-04 DOA: 03/31/2021  PCP: Glendale Chard, MD  Patient coming from: Home  I have personally briefly reviewed patient's old medical records in [employer]  Chief Complaint: N/V, abd pain  HPI: Debbie Barry is a 70 y.o. female with medical history significant of HTN, PAD, GIB earlier this year, iron overload syndrome of unclear etiology being seen by heme/onc.  Pt presents to ED with ~3 week history of intermittent N/V, loose stools.  Symptoms persistent, worsening, now severe.  Nothing makes better or worse.  Now unable to tolerate POs.  Epigastric abd pain.  Missed appointment with Dr. Jana Hakim on 8/26.   ED Course: CT shows gastritis.  Pt put on IVF.  Had episode of MAT in ED, resolved.   Review of Systems: As per HPI, otherwise all review of systems negative.  Past Medical History:  Diagnosis Date   Fatty liver    GERD (gastroesophageal reflux disease)    Hypertension    Osteomyelitis ([place])    right third toe   Peripheral vascular disease ([place])    Ulcer 08/14/20    Past Surgical History:  Procedure Laterality Date   AMPUTATION Right 08/08/2017   Procedure: AMPUTATION RIGHT 3RD TOE;  Surgeon: Newt Minion, MD;  Location: [facility];  Service: Orthopedics;  Laterality: Right;   BIOPSY  08/14/2020   Procedure: BIOPSY;  Surgeon: Clarene Essex, MD;  Location: [facility];  Service: Endoscopy;;   Bunionectomy Right 2006     Right   Bunionectony Left 2006      August   COLONOSCOPY N/A 01/21/2014   Procedure: COLONOSCOPY;  Surgeon: Beryle Beams, MD;  Location: [facility];  Service: Endoscopy;  Laterality: N/A;   ESOPHAGOGASTRODUODENOSCOPY (EGD) WITH PROPOFOL N/A 08/14/2020   Procedure: ESOPHAGOGASTRODUODENOSCOPY (EGD) WITH PROPOFOL;  Surgeon: Clarene Essex, MD;  Location: [facility];  Service: Endoscopy;  Laterality: N/A;   TUBAL LIGATION     WISDOM TOOTH EXTRACTION       reports that  she has never smoked. She has never used smokeless tobacco. She reports current alcohol use of about 5.0 standard drinks per week. She reports that she does not use drugs.  No Known Allergies  Family History  Problem Relation Age of Onset   Breast cancer Mother    Colon cancer Mother    Heart Problems Mother    Hypertension Mother    Multiple myeloma Mother    Arthritis Mother    Cancer Mother    Heart disease Mother    Hypertension Father    Heart disease Father    Multiple myeloma Father    Arthritis Father    Cancer Father      Prior to Admission medications   Medication Sig Start Date End Date Taking? Authorizing Provider  acetaminophen (TYLENOL) 650 MG CR tablet Take 650 mg by mouth every 8 (eight) hours as needed for pain.   Yes [provider]  amLODipine (NORVASC) 5 MG tablet Take 1 tablet (5 mg total) by mouth daily. Patient taking differently: Take 2.5 mg by mouth daily. 02/01/21  Yes Ghumman, Ramandeep, NP  Caffeine-Magnesium Salicylate (DIUREX PO) Take 2 tablets by mouth in the morning and at bedtime.   Yes [provider]  Cholecalciferol (VITAMIN D3) 5000 units CAPS Take 5,000 Units by mouth daily.   Yes [provider]  diphenhydrAMINE (BENADRYL) 25 MG tablet Take 50 mg by mouth every 6 (six) hours as needed  for itching.   Yes [provider]  Magnesium 400 MG CAPS Take 1 capsule by mouth daily in the evening. Patient taking differently: Take 400 mg by mouth every evening. 02/08/21  Yes Ghumman, Ramandeep, NP  Multiple Vitamin (MULTIVITAMIN WITH MINERALS) TABS tablet Take 1 tablet by mouth daily.   Yes [provider]  pantoprazole (PROTONIX) 40 MG tablet Take 1 tablet (40 mg total) by mouth 2 (two) times daily before a meal. Take $RemoveBe'40mg'LXsSvcXMM$  twice daily for 2 weeks, then decrease to $RemoveBef'40mg'EAcVnMgbey$  once daily Patient taking differently: Take 40 mg by mouth daily. 08/15/20  Yes Dessa Phi, DO  potassium chloride SA (KLOR-CON) 20 MEQ tablet  Take 1 tablet (20 mEq total) by mouth 2 (two) times daily. Patient taking differently: Take 20 mEq by mouth daily. 02/14/21  Yes Magrinat, Virgie Dad, MD  sertraline (ZOLOFT) 25 MG tablet Take 1 tablet (25 mg total) by mouth daily. 02/01/21  Yes Ghumman, Ramandeep, NP  Tetrahydrozoline HCl (VISINE OP) Place 2 drops into both eyes daily as needed (for dry/irritated eyes).    Yes [provider]  triamcinolone ointment (KENALOG) 0.5 % Apply 1 application topically 2 (two) times daily. Patient taking differently: Apply 1 application topically 2 (two) times daily as needed (rash/itching). 03/28/21  Yes Glendale Chard, MD    Physical Exam: Vitals:   03/31/21 1845 03/31/21 1900 03/31/21 1930 03/31/21 2030  BP: (!) 163/103 (!) 163/108 (!) 160/101 (!) 154/102  Pulse:  (!) 44 (!) 115 (!) 105  Resp:  $Remo'15 12 16  'KyeYS$ Temp:      TempSrc:      SpO2:  100% 99% 100%    Constitutional: NAD, calm, comfortable Eyes: PERRL, lids and conjunctivae normal ENMT: Mucous membranes are moist. Posterior pharynx clear of any exudate or lesions.Normal dentition.  Neck: normal, supple, no masses, no thyromegaly Respiratory: clear to auscultation bilaterally, no wheezing, no crackles. Normal respiratory effort. No accessory muscle use.  Cardiovascular: Regular rate and rhythm, no murmurs / rubs / gallops. No extremity edema. 2+ pedal pulses. No carotid bruits.  Abdomen: Epigastric TTP Musculoskeletal: no clubbing / cyanosis. No joint deformity upper and lower extremities. Good ROM, no contractures. Normal muscle tone.  Skin: Excoriations on chest and abdominal wall. Neurologic: CN 2-12 grossly intact. Sensation intact, DTR normal. Strength 5/5 in all 4.  Psychiatric: Normal judgment and insight. Alert and oriented x 3. Normal mood.    Labs on Admission: I have personally reviewed following labs and imaging studies  CBC: Recent Labs  Lab 03/31/21 1338  WBC 4.0  NEUTROABS 3.0  HGB 13.6  HCT 39.5  MCV 100.5*   PLT 161   Basic Metabolic Panel: Recent Labs  Lab 03/31/21 1349 03/31/21 1817  NA 137  --   K 3.7  --   CL 104  --   CO2 22  --   GLUCOSE 94  --   BUN 12  --   CREATININE 0.67  --   CALCIUM 8.2*  --   MG 1.4* 1.6*   GFR: CrCl cannot be calculated (Unknown ideal weight.). Liver Function Tests: Recent Labs  Lab 03/31/21 1349  AST 39  ALT 47*  ALKPHOS 119  BILITOT 1.1  PROT 6.0*  ALBUMIN 2.6*   Recent Labs  Lab 03/31/21 1338  LIPASE 20   No results for input(s): AMMONIA in the last 168 hours. Coagulation Profile: No results for input(s): INR, PROTIME in the last 168 hours. Cardiac Enzymes: No results for input(s): CKTOTAL, CKMB,  CKMBINDEX, TROPONINI in the last 168 hours. BNP (last 3 results) No results for input(s): PROBNP in the last 8760 hours. HbA1C: No results for input(s): HGBA1C in the last 72 hours. CBG: No results for input(s): GLUCAP in the last 168 hours. Lipid Profile: No results for input(s): CHOL, HDL, LDLCALC, TRIG, CHOLHDL, LDLDIRECT in the last 72 hours. Thyroid Function Tests: Recent Labs    03/31/21 1339  TSH 3.190   Anemia Panel: No results for input(s): VITAMINB12, FOLATE, FERRITIN, TIBC, IRON, RETICCTPCT in the last 72 hours. Urine analysis:    Component Value Date/Time   COLORURINE STRAW (A) 03/31/2021 1617   APPEARANCEUR CLEAR 03/31/2021 1617   LABSPEC 1.014 03/31/2021 1617   PHURINE 5.0 03/31/2021 1617   GLUCOSEU NEGATIVE 03/31/2021 1617   HGBUR NEGATIVE 03/31/2021 1617   BILIRUBINUR NEGATIVE 03/31/2021 1617   BILIRUBINUR negative 03/30/2020 1215   KETONESUR NEGATIVE 03/31/2021 1617   PROTEINUR NEGATIVE 03/31/2021 1617   UROBILINOGEN 0.2 03/30/2020 1215   NITRITE NEGATIVE 03/31/2021 1617   LEUKOCYTESUR SMALL (A) 03/31/2021 1617    Radiological Exams on Admission: CT ABDOMEN PELVIS W CONTRAST  Result Date: 03/31/2021 CLINICAL DATA:  Acute abdominal pain. EXAM: CT ABDOMEN AND PELVIS WITH CONTRAST TECHNIQUE:  Multidetector CT imaging of the abdomen and pelvis was performed using the standard protocol following bolus administration of intravenous contrast. CONTRAST:  39mL OMNIPAQUE IOHEXOL 350 MG/ML SOLN COMPARISON:  Abdominal ultrasound dated 10/27/2015. FINDINGS: Lower chest: There is mild bibasilar atelectasis. The heart is enlarged. Hepatobiliary: No focal liver abnormality is seen. No gallstones, gallbladder wall thickening, or biliary dilatation. Pancreas: Unremarkable. No pancreatic ductal dilatation or surrounding inflammatory changes. Spleen: Normal in size without focal abnormality. Adrenals/Urinary Tract: Adrenal glands are unremarkable. Kidneys are normal, without renal calculi, focal lesion, or hydronephrosis. Bladder is unremarkable. Stomach/Bowel: There is edema and mild thickening of the wall of the gastric fundus with hyperenhancement of the mucosa which may represent gastritis. Appendix appears normal. There is colonic diverticulosis without evidence of diverticulitis. No evidence of bowel wall thickening, distention, or inflammatory changes. Vascular/Lymphatic: Aortic atherosclerosis. No enlarged abdominal or pelvic lymph nodes. Reproductive: A mass in the left aspect of the uterus measures 3.0 x 2.5 cm. The adnexa appear normal. Other: No abdominal wall hernia or abnormality. No abdominopelvic ascites. Anasarca and diffuse muscle wasting are noted. Musculoskeletal: There are chronic appearing compression deformities with approximately 25% height loss at L3 and 50% height loss at L4. Degenerative changes are seen in the spine. IMPRESSION: 1. Findings which may reflect gastritis. 2. Mass in the left aspect of the uterus likely represents a uterine fibroid. 3. Chronic appearing compression deformities of L3 and L4. Aortic Atherosclerosis (ICD10-I70.0). Electronically Signed   By: Zerita Boers M.D.   On: 03/31/2021 16:18    EKG: Independently reviewed.  Assessment/Plan Principal Problem:    Gastritis Active Problems:   Essential hypertension, benign   Iron overload   Nausea and vomiting    Gastritis - with N/V Wondering if this is related to iron overload? LFTs dont look too bad today. GI pathogen pnl pending (but 3 weeks is quite an extended period for most GI infections). Increase PPI to BID dosing Zofran PRN nausea IVF: LR at 100 Replace Mg Repeat CMP in AM Iron overload - IP consult put into epic Missed office visit 5 days ago with Dr. Jana Hakim Multifocal atrial tachycardia - Episode while in ED, resolved EDP spoke with cards, recd treatment of hypomagnesemia, no further work up recd at this time. Tele  monitor HTN - Cont norvasc  DVT prophylaxis: Lovenox Code Status: Full Family Communication: No family in room Disposition Plan: Home after tolerating POs Consults called: Onc consult put into Epic Admission status: Place in obs     Lakevia Perris, [place] [facility]  How to contact the [facility] Attending or Consulting provider [place] or covering provider during after hours [place], for this patient?  Check the care team in [facility] and look for a) attending/consulting [facility] provider listed and b) the [facility] team listed Log into [email or web address]  Amion Physician Scheduling and messaging for groups and whole hospitals  On call and physician scheduling software for group practices, residents, hospitalists and other medical providers for call, clinic, rotation and shift schedules. OnCall [employer] is a hospital-wide system for scheduling doctors and paging doctors on call. EasyPlot is for scientific plotting and data analysis.  [email or web address]  and use [place]'s universal password to access. If you do not have the password, please contact the hospital operator.  Locate the [facility] provider you are looking for under [facility] and page to a number that you can be directly reached. If you still have difficulty reaching the provider, please page the [facility] (Director on  Call) for the Hospitalists listed on amion for assistance.  03/31/2021, 9:03 PM

## 2021-04-01 ENCOUNTER — Inpatient Hospital Stay: Payer: Self-pay

## 2021-04-01 DIAGNOSIS — Z803 Family history of malignant neoplasm of breast: Secondary | ICD-10-CM | POA: Diagnosis not present

## 2021-04-01 DIAGNOSIS — K2970 Gastritis, unspecified, without bleeding: Secondary | ICD-10-CM | POA: Diagnosis present

## 2021-04-01 DIAGNOSIS — Z8261 Family history of arthritis: Secondary | ICD-10-CM | POA: Diagnosis not present

## 2021-04-01 DIAGNOSIS — Z8249 Family history of ischemic heart disease and other diseases of the circulatory system: Secondary | ICD-10-CM | POA: Diagnosis not present

## 2021-04-01 DIAGNOSIS — Z20822 Contact with and (suspected) exposure to covid-19: Secondary | ICD-10-CM | POA: Diagnosis present

## 2021-04-01 DIAGNOSIS — I471 Supraventricular tachycardia: Secondary | ICD-10-CM | POA: Diagnosis present

## 2021-04-01 DIAGNOSIS — R112 Nausea with vomiting, unspecified: Secondary | ICD-10-CM | POA: Diagnosis not present

## 2021-04-01 DIAGNOSIS — K529 Noninfective gastroenteritis and colitis, unspecified: Secondary | ICD-10-CM | POA: Diagnosis present

## 2021-04-01 DIAGNOSIS — E861 Hypovolemia: Secondary | ICD-10-CM | POA: Diagnosis present

## 2021-04-01 DIAGNOSIS — I739 Peripheral vascular disease, unspecified: Secondary | ICD-10-CM | POA: Diagnosis present

## 2021-04-01 DIAGNOSIS — L97829 Non-pressure chronic ulcer of other part of left lower leg with unspecified severity: Secondary | ICD-10-CM | POA: Diagnosis present

## 2021-04-01 DIAGNOSIS — Z89421 Acquired absence of other right toe(s): Secondary | ICD-10-CM | POA: Diagnosis not present

## 2021-04-01 DIAGNOSIS — K2900 Acute gastritis without bleeding: Secondary | ICD-10-CM | POA: Diagnosis not present

## 2021-04-01 DIAGNOSIS — S81802A Unspecified open wound, left lower leg, initial encounter: Secondary | ICD-10-CM | POA: Diagnosis present

## 2021-04-01 DIAGNOSIS — I959 Hypotension, unspecified: Secondary | ICD-10-CM | POA: Diagnosis present

## 2021-04-01 DIAGNOSIS — E43 Unspecified severe protein-calorie malnutrition: Secondary | ICD-10-CM | POA: Diagnosis present

## 2021-04-01 DIAGNOSIS — I779 Disorder of arteries and arterioles, unspecified: Secondary | ICD-10-CM | POA: Diagnosis not present

## 2021-04-01 DIAGNOSIS — R21 Rash and other nonspecific skin eruption: Secondary | ICD-10-CM | POA: Diagnosis present

## 2021-04-01 DIAGNOSIS — K760 Fatty (change of) liver, not elsewhere classified: Secondary | ICD-10-CM | POA: Diagnosis present

## 2021-04-01 DIAGNOSIS — Z807 Family history of other malignant neoplasms of lymphoid, hematopoietic and related tissues: Secondary | ICD-10-CM | POA: Diagnosis not present

## 2021-04-01 DIAGNOSIS — E860 Dehydration: Secondary | ICD-10-CM | POA: Diagnosis present

## 2021-04-01 DIAGNOSIS — K219 Gastro-esophageal reflux disease without esophagitis: Secondary | ICD-10-CM | POA: Diagnosis present

## 2021-04-01 DIAGNOSIS — Z800 Family history of malignant neoplasm of digestive organs: Secondary | ICD-10-CM | POA: Diagnosis not present

## 2021-04-01 DIAGNOSIS — R339 Retention of urine, unspecified: Secondary | ICD-10-CM | POA: Diagnosis present

## 2021-04-01 DIAGNOSIS — I10 Essential (primary) hypertension: Secondary | ICD-10-CM | POA: Diagnosis present

## 2021-04-01 LAB — CBC
HCT: 45.6 % (ref 36.0–46.0)
Hemoglobin: 15.9 g/dL — ABNORMAL HIGH (ref 12.0–15.0)
MCH: 34.6 pg — ABNORMAL HIGH (ref 26.0–34.0)
MCHC: 34.9 g/dL (ref 30.0–36.0)
MCV: 99.3 fL (ref 80.0–100.0)
Platelets: 164 10*3/uL (ref 150–400)
RBC: 4.59 MIL/uL (ref 3.87–5.11)
RDW: 16.6 % — ABNORMAL HIGH (ref 11.5–15.5)
WBC: 7.4 10*3/uL (ref 4.0–10.5)
nRBC: 0 % (ref 0.0–0.2)

## 2021-04-01 LAB — COMPREHENSIVE METABOLIC PANEL
ALT: 34 U/L (ref 0–44)
AST: 39 U/L (ref 15–41)
Albumin: 2.1 g/dL — ABNORMAL LOW (ref 3.5–5.0)
Alkaline Phosphatase: 103 U/L (ref 38–126)
Anion gap: 13 (ref 5–15)
BUN: 10 mg/dL (ref 8–23)
CO2: 21 mmol/L — ABNORMAL LOW (ref 22–32)
Calcium: 7.7 mg/dL — ABNORMAL LOW (ref 8.9–10.3)
Chloride: 104 mmol/L (ref 98–111)
Creatinine, Ser: 0.89 mg/dL (ref 0.44–1.00)
GFR, Estimated: >60 mL/min (ref >60)
Glucose, Bld: 96 mg/dL (ref 70–99)
Potassium: 3 mmol/L — ABNORMAL LOW (ref 3.5–5.1)
Sodium: 138 mmol/L (ref 135–145)
Total Bilirubin: 0.8 mg/dL (ref 0.3–1.2)
Total Protein: 5.1 g/dL — ABNORMAL LOW (ref 6.5–8.1)

## 2021-04-01 LAB — MAGNESIUM: Magnesium: 1.7 mg/dL (ref 1.7–2.4)

## 2021-04-01 LAB — URIC ACID: Uric Acid, Serum: 6.1 mg/dL (ref 2.5–7.1)

## 2021-04-01 LAB — URINALYSIS, ROUTINE W REFLEX MICROSCOPIC
Bacteria, UA: NONE SEEN
Bilirubin Urine: NEGATIVE
Glucose, UA: NEGATIVE mg/dL
Ketones, ur: 5 mg/dL — AB
Leukocytes,Ua: NEGATIVE
Nitrite: NEGATIVE
Protein, ur: NEGATIVE mg/dL
RBC / HPF: >50 RBC/hpf — ABNORMAL HIGH (ref 0–5)
Specific Gravity, Urine: 1.027 (ref 1.005–1.030)
pH: 6 (ref 5.0–8.0)

## 2021-04-01 MED ORDER — ORAL CARE MOUTH RINSE
15.0000 mL | Freq: Two times a day (BID) | OROMUCOSAL | Status: DC
  Administered 2021-04-03 – 2021-04-04 (×4): 15 mL via OROMUCOSAL

## 2021-04-01 MED ORDER — DIPHENHYDRAMINE HCL 50 MG/ML IJ SOLN
12.5000 mg | Freq: Once | INTRAMUSCULAR | Status: AC
  Administered 2021-04-01: 12.5 mg via INTRAVENOUS
  Filled 2021-04-01: qty 1

## 2021-04-01 MED ORDER — PANTOPRAZOLE SODIUM 40 MG IV SOLR
40.0000 mg | Freq: Two times a day (BID) | INTRAVENOUS | Status: DC
  Administered 2021-04-01 – 2021-04-04 (×6): 40 mg via INTRAVENOUS
  Filled 2021-04-01 (×6): qty 40

## 2021-04-01 MED ORDER — SODIUM CHLORIDE 0.9 % IV BOLUS
1000.0000 mL | Freq: Once | INTRAVENOUS | Status: AC
  Administered 2021-04-01: 1000 mL via INTRAVENOUS

## 2021-04-01 MED ORDER — BOOST / RESOURCE BREEZE PO LIQD CUSTOM
1.0000 | Freq: Three times a day (TID) | ORAL | Status: DC
  Administered 2021-04-02: 1 via ORAL

## 2021-04-01 MED ORDER — POTASSIUM CHLORIDE 10 MEQ/100ML IV SOLN
10.0000 meq | INTRAVENOUS | Status: AC
  Administered 2021-04-01 – 2021-04-02 (×4): 10 meq via INTRAVENOUS
  Filled 2021-04-01 (×4): qty 100

## 2021-04-01 MED ORDER — DIPHENHYDRAMINE HCL 50 MG/ML IJ SOLN
12.5000 mg | Freq: Four times a day (QID) | INTRAMUSCULAR | Status: DC | PRN
  Administered 2021-04-03: 12.5 mg via INTRAVENOUS
  Filled 2021-04-01 (×2): qty 1

## 2021-04-01 MED ORDER — CHLORHEXIDINE GLUCONATE 0.12 % MT SOLN
15.0000 mL | Freq: Two times a day (BID) | OROMUCOSAL | Status: DC
  Administered 2021-04-01 – 2021-04-04 (×7): 15 mL via OROMUCOSAL
  Filled 2021-04-01 (×7): qty 15

## 2021-04-01 NOTE — Progress Notes (Signed)
Night shift RN bladder scanned patient after period of not voiding, revealed 723 in urine.  Straight catheter yielded 750 red-tinted, amber urine.  MD notified.

## 2021-04-01 NOTE — Progress Notes (Signed)
Patient's BP at 1144 86/56.  Patient reported "seeing floaters," feeling "dizzy" and "lightheaded," and feeling like she "wasn't going to take another breath."  No respiratory distress noted, patient calm in bed.  MD notified. Held AM dose of amlodipine per MD order.   Administered 1,000 mL bolus NS per MD order.  BP 86/56 at 1316, MD notified.    Administered second bolus 1,000 mL NS per MD order. BP 98/52 at 1446.  MD notified.  Patient's symptoms relieved somewhat after second bolus.  Awaiting further orders from MD.

## 2021-04-01 NOTE — Progress Notes (Signed)
Spoke with Yong Channel RN re PICC line.  May be placed Monday if unable to do tonight.  Pt has adequate PIV access for current needs, needs PICC for labs per [facility].Marland Kitchen

## 2021-04-01 NOTE — Progress Notes (Signed)
PROGRESS NOTE    Debbie Barry  ZDG:[id number] DOB: [birth year] DOA: 03/31/2021 PCP: Glendale Chard, MD   Brief Narrative: Debbie Barry is a 70 y.o. female with a history of hypertension, PAD, GI bleeding, gastritis, iron overload syndrome. Patient presented secondary to nausea, vomiting and abdominal pain with concern for acute gastritis. IV fluids and antiemetics initiated.   Assessment & Plan:   Principal Problem:   Gastritis Active Problems:   Essential hypertension, benign   Iron overload   Nausea and vomiting   Gastritis Nausea/vomiting Chronic disease with acute exacerbation. Suggested on CT scan. Patient also has diarrhea, so possible nausea/vomiting could be related to GI infection. -Protonix IV -IV fluids -Zofran prn -Advanced diet  Diarrhea On and off for weeks. Last bowel movement yesterday. GI pathogen panel and C. Difficile ordered.  Hypotension Likely hypovolemia in setting of above. Unlikely related to recent prednisone use since patient's blood pressure was significantly elevated on admission in addition to patient being on a short prednisone taper. -1L NS IV fluid bolus x2 -Progressive care -If continued hypotension, will place midline, transfer to stepdown, obtain lactic acid  Macular rash Difficult to assess as patient has scratched many lesions. She states that they develop blister appearing lesions. Rash is on legs, arms, back. Patient was seen by PCP and prescribed prednisone. Possibly related to change in detergent. Symptomatic here with significant evidence of excoriation. -Continue benadryl prn -Kenalog cream for spot treatments rather than whole body -Hold on steroid use after discussion with patient -Uric acid  Iron overload Unclear etiology. Follows with Dr. Jana Hakim.   Multifocal atrial tachycardia Noted in the ED. Discussed with cardiology who recommended no follow-up. Magnesium replaced.  Primary hypertension Currently  hypotensive. Amlodipine held.  Leg leg ulcer Slow to heal in setting of known PAD -WOC consult  PAD Poor pedal pulses noted. Likely contributing to leg wound -ABIs -Outpatient vascular consult most likely   DVT prophylaxis: Lovenox Code Status:   Code Status: Full Code Family Communication: None at bedside Disposition Plan: Discharge home likely in 2-4 days pending improvement of blood pressure, nausea/vomiting, diarrhea, ability to tolerate oral intake   Consultants:  None  Procedures:  None  Antimicrobials: None    Subjective: Patient reports no diarrhea this morning, but had some last night. Continues to have rash on body with itching.  Objective: Vitals:   04/01/21 1400 04/01/21 1430 04/01/21 1435 04/01/21 1446  BP: 99/61 114/63 (!) 108/53 (!) 98/52  Pulse: 72  77   Resp: 12 (!) 21 (!) 9   Temp:      TempSrc:      SpO2: 100%  100%   Weight:      Height:        Intake/Output Summary (Last 24 hours) at 04/01/2021 1522 Last data filed at 04/01/2021 0817 Gross per 24 hour  Intake 1500 ml  Output 750 ml  Net 750 ml   Filed Weights   03/31/21 2347  Weight: 58.1 kg    Examination:  General exam: Appears calm and comfortable  Respiratory system: Clear to auscultation. Respiratory effort normal. Cardiovascular system: S1 & S2 heard, RRR. Gastrointestinal system: Abdomen is nondistended, soft and nontender. No organomegaly or masses felt. Normal bowel sounds heard. Central nervous system: Alert and oriented. No focal neurological deficits. Musculoskeletal: No edema. No calf tenderness Skin: No cyanosis. Scattered macular rash on inner thighs. Patient with many areas of excoriation especially on back with underlying ecchymosis present. Psychiatry: Judgement and insight appear  normal. Mood & affect appropriate.     Data Reviewed: I have personally reviewed following labs and imaging studies  CBC Lab Results  Component Value Date   WBC 7.4 04/01/2021    RBC 4.59 04/01/2021   HGB 15.9 (H) 04/01/2021   HCT 45.6 04/01/2021   MCV 99.3 04/01/2021   MCH 34.6 (H) 04/01/2021   PLT 164 04/01/2021   MCHC 34.9 04/01/2021   RDW 16.6 (H) 04/01/2021   LYMPHSABS 0.7 03/31/2021   MONOABS 0.2 03/31/2021   EOSABS 0.0 03/31/2021   BASOSABS 0.0 38/03/1750     Last metabolic panel Lab Results  Component Value Date   NA 137 03/31/2021   K 3.7 03/31/2021   CL 104 03/31/2021   CO2 22 03/31/2021   BUN 12 03/31/2021   CREATININE 0.67 03/31/2021   GLUCOSE 94 03/31/2021   GFRNONAA >60 03/31/2021   GFRAA 105 08/22/2020   CALCIUM 8.2 (L) 03/31/2021   PROT 6.0 (L) 03/31/2021   ALBUMIN 2.6 (L) 03/31/2021   LABGLOB 2.3 02/01/2021   AGRATIO 1.1 02/01/2021   BILITOT 1.1 03/31/2021   ALKPHOS 119 03/31/2021   AST 39 03/31/2021   ALT 47 (H) 03/31/2021   ANIONGAP 11 03/31/2021    CBG (last 3)  No results for input(s): GLUCAP in the last 72 hours.   GFR: Estimated Creatinine Clearance: 55.9 mL/min (by C-G formula based on SCr of 0.67 mg/dL).  Coagulation Profile: No results for input(s): INR, PROTIME in the last 168 hours.  Recent Results (from the past 240 hour(s))  Resp Panel by RT-PCR (Flu A&B, Covid) Nasopharyngeal Swab     Status: None   Collection Time: 03/31/21  8:44 PM   Specimen: Nasopharyngeal Swab; Nasopharyngeal(NP) swabs in vial transport medium  Result Value Ref Range Status   SARS Coronavirus 2 by RT PCR NEGATIVE NEGATIVE Final    Comment: (NOTE) SARS-CoV-2 target nucleic acids are NOT DETECTED.  The SARS-CoV-2 RNA is generally detectable in upper respiratory specimens during the acute phase of infection. The lowest concentration of SARS-CoV-2 viral copies this assay can detect is 138 copies/mL. A negative result does not preclude SARS-Cov-2 infection and should not be used as the sole basis for treatment or other patient management decisions. A negative result may occur with  improper specimen collection/handling, submission  of specimen other than nasopharyngeal swab, presence of viral mutation(s) within the areas targeted by this assay, and inadequate number of viral copies(<138 copies/mL). A negative result must be combined with clinical observations, patient history, and epidemiological information. The expected result is Negative.  Fact Sheet for Patients:  EntrepreneurPulse.com.au  Fact Sheet for Healthcare Providers:  IncredibleEmployment.be  This test is no t yet approved or cleared by the [place] FDA and  has been authorized for detection and/or diagnosis of SARS-CoV-2 by FDA under an Emergency Use Authorization (EUA). This EUA will remain  in effect (meaning this test can be used) for the duration of the COVID-19 declaration under Section 564(b)(1) of the Act, 21 U.S.C.section 360bbb-3(b)(1), unless the authorization is terminated  or revoked sooner.       Influenza A by PCR NEGATIVE NEGATIVE Final   Influenza B by PCR NEGATIVE NEGATIVE Final    Comment: (NOTE) The Xpert Xpress SARS-CoV-2/FLU/RSV plus assay is intended as an aid in the diagnosis of influenza from Nasopharyngeal swab specimens and should not be used as a sole basis for treatment. Nasal washings and aspirates are unacceptable for Xpert Xpress SARS-CoV-2/FLU/RSV testing.  Fact Sheet for Patients:  EntrepreneurPulse.com.au  Fact Sheet for Healthcare Providers: IncredibleEmployment.be  This test is not yet approved or cleared by the [place] FDA and has been authorized for detection and/or diagnosis of SARS-CoV-2 by FDA under an Emergency Use Authorization (EUA). This EUA will remain in effect (meaning this test can be used) for the duration of the COVID-19 declaration under Section 564(b)(1) of the Act, 21 U.S.C. section 360bbb-3(b)(1), unless the authorization is terminated or revoked.  Performed at [facility], [place]  [address] [place], [place] [address]         Radiology Studies: CT ABDOMEN PELVIS W CONTRAST  Result Date: 03/31/2021 CLINICAL DATA:  Acute abdominal pain. EXAM: CT ABDOMEN AND PELVIS WITH CONTRAST TECHNIQUE: Multidetector CT imaging of the abdomen and pelvis was performed using the standard protocol following bolus administration of intravenous contrast. CONTRAST:  18mL OMNIPAQUE IOHEXOL 350 MG/ML SOLN COMPARISON:  Abdominal ultrasound dated 10/27/2015. FINDINGS: Lower chest: There is mild bibasilar atelectasis. The heart is enlarged. Hepatobiliary: No focal liver abnormality is seen. No gallstones, gallbladder wall thickening, or biliary dilatation. Pancreas: Unremarkable. No pancreatic ductal dilatation or surrounding inflammatory changes. Spleen: Normal in size without focal abnormality. Adrenals/Urinary Tract: Adrenal glands are unremarkable. Kidneys are normal, without renal calculi, focal lesion, or hydronephrosis. Bladder is unremarkable. Stomach/Bowel: There is edema and mild thickening of the wall of the gastric fundus with hyperenhancement of the mucosa which may represent gastritis. Appendix appears normal. There is colonic diverticulosis without evidence of diverticulitis. No evidence of bowel wall thickening, distention, or inflammatory changes. Vascular/Lymphatic: Aortic atherosclerosis. No enlarged abdominal or pelvic lymph nodes. Reproductive: A mass in the left aspect of the uterus measures 3.0 x 2.5 cm. The adnexa appear normal. Other: No abdominal wall hernia or abnormality. No abdominopelvic ascites. Anasarca and diffuse muscle wasting are noted. Musculoskeletal: There are chronic appearing compression deformities with approximately 25% height loss at L3 and 50% height loss at L4. Degenerative changes are seen in the spine. IMPRESSION: 1. Findings which may reflect gastritis. 2. Mass in the left aspect of the uterus likely represents a uterine fibroid. 3. Chronic appearing compression  deformities of L3 and L4. Aortic Atherosclerosis (ICD10-I70.0). Electronically Signed   By: Zerita Boers M.D.   On: 03/31/2021 16:18        Scheduled Meds:  chlorhexidine  15 mL Mouth Rinse BID   enoxaparin (LOVENOX) injection  40 mg Subcutaneous Q24H   feeding supplement  1 Container Oral TID BM   magnesium oxide  400 mg Oral QPM   mouth rinse  15 mL Mouth Rinse q12n4p   multivitamin with minerals  1 tablet Oral Daily   pantoprazole (PROTONIX) IV  40 mg Intravenous Q12H   potassium chloride SA  20 mEq Oral Daily   sertraline  25 mg Oral Daily   Continuous Infusions:  lactated ringers Stopped (04/01/21 1335)     LOS: 0 days     Cordelia Poche, MD [facility] 04/01/2021, 3:22 PM  If 7PM-7AM, please contact night-coverage [email or web address]

## 2021-04-02 ENCOUNTER — Inpatient Hospital Stay (HOSPITAL_COMMUNITY): Payer: Medicare HMO

## 2021-04-02 DIAGNOSIS — I779 Disorder of arteries and arterioles, unspecified: Secondary | ICD-10-CM

## 2021-04-02 LAB — BASIC METABOLIC PANEL
Anion gap: 7 (ref 5–15)
BUN: 9 mg/dL (ref 8–23)
CO2: 15 mmol/L — ABNORMAL LOW (ref 22–32)
Calcium: 7.9 mg/dL — ABNORMAL LOW (ref 8.9–10.3)
Chloride: 117 mmol/L — ABNORMAL HIGH (ref 98–111)
Creatinine, Ser: 0.82 mg/dL (ref 0.44–1.00)
GFR, Estimated: >60 mL/min (ref >60)
Glucose, Bld: 94 mg/dL (ref 70–99)
Potassium: 5.8 mmol/L — ABNORMAL HIGH (ref 3.5–5.1)
Sodium: 139 mmol/L (ref 135–145)

## 2021-04-02 LAB — URINE CULTURE: Culture: NO GROWTH

## 2021-04-02 MED ORDER — ENSURE ENLIVE PO LIQD
237.0000 mL | Freq: Three times a day (TID) | ORAL | Status: DC
  Administered 2021-04-02 – 2021-04-04 (×3): 237 mL via ORAL

## 2021-04-02 NOTE — Evaluation (Signed)
Physical Therapy Evaluation Patient Details Name: Debbie Barry MRN: [id number] DOB: 1951-05-13 Today's Date: 04/02/2021  History of Present Illness  Patient is a 70 year old female who presented secondary to nausea, vomiting and abdominal pain with concern for acute gastritis.  UVO:ZDGU overload syndrome, GI bleeding, HTN, PAD, GI bleeding, gastritis, iron overload syndrome.  Clinical Impression  Pt admitted with above diagnosis.  Pt currently with functional limitations due to the deficits listed below (see PT Problem List). Pt will benefit from skilled PT to increase their independence and safety with mobility to allow discharge to the venue listed below.  Pt having trouble with performing sit to stand however reports sometimes this occurs at home.  Spouse in room and pt requested he come over to also assist and pt able to rise with spouse assist.  Once pt standing, pt min/guard for ambulating however HR elevated (see mobility section below).  Pt felt weaker due to not ambulating since admission.  Pt likely will be able to return home if assisted with mobilizing during acute stay.  IF spouse feels unable to physically assist, pt may need SNF.  If nursing having difficulty with standing, pt may benefit from stedy for pt to self assist better and safely.      Recommendations for follow up therapy are one component of a multi-disciplinary discharge planning process, led by the attending physician.  Recommendations may be updated based on patient status, additional functional criteria and insurance authorization.  Follow Up Recommendations Home health PT;Supervision for mobility/OOB    Equipment Recommendations  None recommended by PT    Recommendations for Other Services       Precautions / Restrictions Precautions Precautions: Fall Precaution Comments: monitor HR Restrictions Weight Bearing Restrictions: No      Mobility  Bed Mobility Overal bed mobility: Needs Assistance Bed  Mobility: Supine to Sit     Supine to sit: Min guard;HOB elevated     General bed mobility comments: pt self assisted LEs over EOB, utilized rail and elevated HOB    Transfers Overall transfer level: Needs assistance Equipment used: Rolling walker (2 wheeled) Transfers: Sit to/from Stand Sit to Stand: Mod assist         General transfer comment: attempted to stand x3, pt required mod assist (and spouse stepped over to assist as he typically does at home if pt unable to stand)  Ambulation/Gait Ambulation/Gait assistance: Min guard Gait Distance (Feet): 80 Feet Assistive device: Rolling walker (2 wheeled) Gait Pattern/deviations: Step-through pattern;Decreased stride length Gait velocity: dec   General Gait Details: verbal cues for safe use of RW, HR elevated to 140-160 bpm so limited distance; SPO2 reading low however pt gripping RW and SpO2 in upper 90s with hand relaxed with pt on room air (RN notified of O2 and HR)  Stairs            Wheelchair Mobility    Modified Rankin (Stroke Patients Only)       Balance Overall balance assessment: Needs assistance Sitting-balance support: Feet supported Sitting balance-Leahy Scale: Good     Standing balance support: Bilateral upper extremity supported Standing balance-Leahy Scale: Poor Standing balance comment: reliant on UE support                             Pertinent Vitals/Pain Pain Assessment: Faces Faces Pain Scale: Hurts little more Pain Location: low back with attempts at movement Pain Descriptors / Indicators: Aching;Sore Pain Intervention(s):  Repositioned;Monitored during session    [place] expects to be discharged to:: Private residence Living Arrangements: Spouse/significant other Available Help at Discharge: Family;Available 24 hours/day Type of Home: House Home Access: Level entry     Home Layout: One level Home Equipment: Toilet riser;Shower seat - built in       Prior Function Level of Independence: Independent with assistive device(s);Needs assistance   Gait / Transfers Assistance Needed: typically independent, has RW if needed     Comments: pt and spouse report spouse assists with sit to stands when pt unable at home     Hand Dominance   Dominant Hand: Right    Extremity/Trunk Assessment   Upper Extremity Assessment Upper Extremity Assessment: RUE deficits/detail;LUE deficits/detail RUE Deficits / Details: patient unable to lift shoulder past 20 degrees of flexion. noted to have mild edema LUE Deficits / Details: patient unable to flex shoulder past 20 degrees with moderate edema noted in UE    Lower Extremity Assessment Lower Extremity Assessment: Generalized weakness    Cervical / Trunk Assessment Cervical / Trunk Assessment: Kyphotic  Communication   Communication: No difficulties  Cognition Arousal/Alertness: Awake/alert Behavior During Therapy: WFL for tasks assessed/performed Overall Cognitive Status: Within Functional Limits for tasks assessed                                        General Comments      Exercises     Assessment/Plan    PT Assessment Patient needs continued PT services  PT Problem List Decreased strength;Decreased mobility;Decreased activity tolerance;Decreased balance;Decreased knowledge of use of DME       PT Treatment Interventions Gait training;Balance training;DME instruction;Therapeutic exercise;Functional mobility training;Therapeutic activities;Patient/family education    PT Goals (Current goals can be found in the Care Plan section)  Acute Rehab PT Goals Patient Stated Goal: to get stronger PT Goal Formulation: With patient/family Time For Goal Achievement: 04/16/21 Potential to Achieve Goals: Good    Frequency Min 3X/week   Barriers to discharge        Co-evaluation               AM-PAC PT "6 Clicks" Mobility  Outcome Measure Help needed turning  from your back to your side while in a flat bed without using bedrails?: A Little Help needed moving from lying on your back to sitting on the side of a flat bed without using bedrails?: A Little Help needed moving to and from a bed to a chair (including a wheelchair)?: A Lot Help needed standing up from a chair using your arms (e.g., wheelchair or bedside chair)?: A Lot Help needed to walk in hospital room?: A Little Help needed climbing 3-5 steps with a railing? : A Little 6 Click Score: 16    End of Session Equipment Utilized During Treatment: Gait belt Activity Tolerance: Patient tolerated treatment well Patient left: with call bell/phone within reach;in chair;with chair alarm set Nurse Communication: Mobility status PT Visit Diagnosis: Difficulty in walking, not elsewhere classified (R26.2);Muscle weakness (generalized) (M62.81)    Time: 7124-5809 PT Time Calculation (min) (ACUTE ONLY): 18 min   Charges:   PT Evaluation $PT Eval Low Complexity: 1 Low        Kati PT, DPT Acute Rehabilitation Services Pager: [phone] Office: [place] 04/02/2021, 4:33 PM

## 2021-04-02 NOTE — Plan of Care (Signed)

## 2021-04-02 NOTE — Consult Note (Signed)
WOC Nurse Consult Note: Reason for Consult: LLE non healing wound Patient reports using kenalog cream and it was dry and she didn't need dressing.  Since admission to hospital and covered, mild drainage  Wound type: full thickness wound; unknown etiology.  Patient with history of skin lesions Pressure Injury POA: NA Measurement:0.3cm x 0.2cm x 0.2cm  Wound GZQ:JSIDXF Drainage (amount, consistency, odor) yellow Periwound: intact  Dressing procedure/placement/frequency: Continue silicone foam dressing, change every 3 days.   Discussed POC with patient and bedside nurse.  Re consult if needed, will not follow at this time. Thanks  Audreyana Huntsberry [employer], RN,CWOCN, CNS, [place] [phone])

## 2021-04-02 NOTE — Progress Notes (Signed)
Bladder scans done q4 hrs per order. At 1235 bladder scan revealed 205, MD notified to decide whether to in and out cath or place foley. MD did not respond. Bladder scan done again around 1700 and the scan revealed >175, MD notified to see what the next intervention should be and MD did not respond. Passed onto night shift nurse the bladder scan results and that pt is not able to void on her own despite receiving IV and PO fluids. Night shift nurse will follow up and monitor.

## 2021-04-02 NOTE — Evaluation (Signed)
Occupational Therapy Evaluation Patient Details Name: Debbie Barry MRN: [id number] DOB: 10/15/1950 Today's Date: 04/02/2021   History of Present Illness Patient is a 70 year old female who presented to the hospital with nausea and vomitting. patient was found to havegasteritis, LLE ulcer, hypotension, PAD, and chronic deformities in L3-4. LEX:NTZG overload syndrome, GI bleeding, HTN   Clinical Impression   Patient is a 70 year old female who was admitted for above. Patient was previously living at home with husband. Currently patient is +2 for transfers and standing tasks. Patient was noted to have decreased functional activity tolerance, decreased endurance, decreased standing balance, and increased pain with movements. Patient would continue to benefit from skilled OT services at this time while admitted and after d/c to address noted deficits in order to improve overall safety and independence in ADLs.       Recommendations for follow up therapy are one component of a multi-disciplinary discharge planning process, led by the attending physician.  Recommendations may be updated based on patient status, additional functional criteria and insurance authorization.   Follow Up Recommendations  SNF    Equipment Recommendations  None recommended by OT    Recommendations for Other Services       Precautions / Restrictions Precautions Precautions: Fall Precaution Comments: monitor O2 Restrictions Weight Bearing Restrictions: No      Mobility Bed Mobility               General bed mobility comments: patient was in recliner    Transfers                 General transfer comment: attempted to stand with patient unable to transition into upright standing at this time. patient able to clear about 1 inch off recliner with max A    Balance Overall balance assessment: Needs assistance Sitting-balance support: Feet supported Sitting balance-Leahy Scale: Good                                      ADL either performed or assessed with clinical judgement   ADL Overall ADL's : Needs assistance/impaired Eating/Feeding: Set up;Sitting   Grooming: Wash/dry face;Wash/dry hands;Sitting;Set up   Upper Body Bathing: Minimal assistance;Sitting   Lower Body Bathing: Moderate assistance;Sitting/lateral leans Lower Body Bathing Details (indicate cue type and reason): patient was able to simulate lateral leaning in recliner on this date for bathing tasks. Upper Body Dressing : Minimal assistance   Lower Body Dressing: Moderate assistance;Sitting/lateral leans     Toilet Transfer Details (indicate cue type and reason): attempted sit to stand with patient able to push up bottom to clear off chair about one inch Toileting- Clothing Manipulation and Hygiene: Moderate assistance;Sitting/lateral lean               Vision Patient Visual Report: No change from baseline       Perception     Praxis      Pertinent Vitals/Pain Pain Assessment: Faces Faces Pain Scale: Hurts little more Pain Location: low back with attempts at movement     Hand Dominance Right   Extremity/Trunk Assessment Upper Extremity Assessment Upper Extremity Assessment: RUE deficits/detail;LUE deficits/detail RUE Deficits / Details: patient unable to lift shoulder past 20 degrees of flexion. noted to have mild edema LUE Deficits / Details: patient unable to flex shoulder past 20 degrees with moderate edema noted in UE   Lower Extremity Assessment Lower Extremity  Assessment: Defer to PT evaluation   Cervical / Trunk Assessment Cervical / Trunk Assessment: Kyphotic   Communication Communication Communication: No difficulties   Cognition                                           General Comments       Exercises     Shoulder Instructions      Home Living Family/patient expects to be discharged to:: Private residence Living Arrangements:  Spouse/significant other Available Help at Discharge: Family;Available 24 hours/day Type of Home: House       Home Layout: One level     Bathroom Shower/Tub: Other (comment);Walk-in shower   Bathroom Toilet: Handicapped height     Home Equipment: Toilet riser;Shower seat - built in          Prior Functioning/Environment Level of Independence: Independent with assistive device(s)                 OT Problem List: Decreased strength;Decreased range of motion;Impaired balance (sitting and/or standing);Decreased safety awareness;Decreased activity tolerance;Decreased knowledge of precautions;Decreased knowledge of use of DME or AE;Decreased coordination      OT Treatment/Interventions: Self-care/ADL training;Therapeutic exercise;DME and/or AE instruction;Therapeutic activities;Balance training;Patient/family education    OT Goals(Current goals can be found in the care plan section) Acute Rehab OT Goals Patient Stated Goal: to get stronger OT Goal Formulation: With patient Time For Goal Achievement: 04/16/21 Potential to Achieve Goals: Good  OT Frequency: Min 2X/week   Barriers to D/C:    husband is unable to offer current level of physical assist at home       Co-evaluation              AM-PAC OT "6 Clicks" Daily Activity     Outcome Measure Help from another person eating meals?: A Little Help from another person taking care of personal grooming?: A Little Help from another person toileting, which includes using toliet, bedpan, or urinal?: Total Help from another person bathing (including washing, rinsing, drying)?: Total Help from another person to put on and taking off regular upper body clothing?: A Lot Help from another person to put on and taking off regular lower body clothing?: Total 6 Click Score: 11   End of Session Nurse Communication: Other (comment) (nurse cleared patient to participate)  Activity Tolerance: Patient tolerated treatment  well Patient left: in chair;with call bell/phone within reach;with chair alarm set;with family/visitor present  OT Visit Diagnosis: Unsteadiness on feet (R26.81);Other abnormalities of gait and mobility (R26.89);History of falling (Z91.81)                Time: 4650-3546 OT Time Calculation (min): 14 min Charges:  OT General Charges $OT Visit: 1 Visit OT Evaluation $OT Eval Moderate Complexity: 1 Mod  Jackelyn Poling OTR/L, MS Acute Rehabilitation Department Office# [phone] Pager# [phone]   [place] 04/02/2021, 3:38 PM

## 2021-04-02 NOTE — Progress Notes (Signed)
PROGRESS NOTE    Chele Cornell  OFB:[id number] DOB: 1951-05-14 DOA: 03/31/2021 PCP: Glendale Chard, MD   Brief Narrative: Catrena Vari is a 70 y.o. female with a history of hypertension, PAD, GI bleeding, gastritis, iron overload syndrome. Patient presented secondary to nausea, vomiting and abdominal pain with concern for acute gastritis. IV fluids and antiemetics initiated.   Assessment & Plan:   Principal Problem:   Gastritis Active Problems:   Essential hypertension, benign   Iron overload   Nausea and vomiting   Gastritis Nausea/vomiting Chronic disease with acute exacerbation. Suggested on CT scan. Patient also has diarrhea, so possible nausea/vomiting could be related to GI infection. Improving. -Protonix IV -IV fluids today; likely discontinue tomorrow -Zofran prn -Advance diet  Diarrhea On and off for weeks. Last bowel movement yesterday. GI pathogen panel and C. Difficile ordered.  Hypotension Likely hypovolemia in setting of above. Unlikely related to recent prednisone use since patient's blood pressure was significantly elevated on admission in addition to patient being on a short prednisone taper. Now resolved with adequate fluid resuscitation.  Macular rash Difficult to assess as patient has scratched many lesions. She states that they develop blister appearing lesions. Rash is on legs, arms, back. Patient was seen by PCP and prescribed prednisone. Possibly related to change in detergent. Symptomatic here with significant evidence of excoriation. -Continue benadryl prn -Kenalog cream for spot treatments rather than whole body -Hold on steroid use after discussion with patient  Urinary retention Unknown etiology. She has required 2 in/out catheterizations to date. -Bladder scan q4 hours; foley catheter if recurrent urinary retention  Iron overload Unclear etiology. Follows with Dr. Jana Hakim.   Multifocal atrial tachycardia Noted in the ED.  Discussed with cardiology who recommended no follow-up. Magnesium replaced.  Primary hypertension Currently hypotensive. Amlodipine held.  Leg leg ulcer Slow to heal in setting of known PAD -WOC consult  PAD Poor pedal pulses noted. Likely contributing to leg wound. ABI suggest left > right disease. Patient states she was previously followed by vascular surgery who offered her stent placement for which she declined. May be beneficial for her to follow-up with vascular surgery as an outpatient.   DVT prophylaxis: Lovenox Code Status:   Code Status: Full Code Family Communication: None at bedside Disposition Plan: Discharge home likely in 1-3 days pending improvement of blood pressure, nausea/vomiting, diarrhea, ability to tolerate oral intake   Consultants:  None  Procedures:  None  Antimicrobials: None    Subjective: Had an episode of emesis last night but none this morning. Wants to advance diet  Objective: Vitals:   04/02/21 0800 04/02/21 0900 04/02/21 1000 04/02/21 1015  BP:    135/74  Pulse: 98 75 (!) 102 81  Resp: 12 14 19 16   Temp:      TempSrc:      SpO2: 98% 100% 100%   Weight:      Height:        Intake/Output Summary (Last 24 hours) at 04/02/2021 1036 Last data filed at 04/02/2021 0350 Gross per 24 hour  Intake 3183.51 ml  Output 249 ml  Net 2934.51 ml    Filed Weights   03/31/21 2347  Weight: 58.1 kg    Examination:  General exam: Appears calm and comfortable  Respiratory system: Clear to auscultation. Respiratory effort normal. Cardiovascular system: S1 & S2 heard, RRR. No murmurs, rubs, gallops or clicks. Gastrointestinal system: Abdomen is nondistended, soft and nontender. No organomegaly or masses felt. Normal bowel sounds heard.  Central nervous system: Alert and oriented. No focal neurological deficits. Musculoskeletal: No edema. No calf tenderness Psychiatry: Judgement and insight appear normal. Mood & affect appropriate.    Data  Reviewed: I have personally reviewed following labs and imaging studies  CBC Lab Results  Component Value Date   WBC 7.4 04/01/2021   RBC 4.59 04/01/2021   HGB 15.9 (H) 04/01/2021   HCT 45.6 04/01/2021   MCV 99.3 04/01/2021   MCH 34.6 (H) 04/01/2021   PLT 164 04/01/2021   MCHC 34.9 04/01/2021   RDW 16.6 (H) 04/01/2021   LYMPHSABS 0.7 03/31/2021   MONOABS 0.2 03/31/2021   EOSABS 0.0 03/31/2021   BASOSABS 0.0 54/62/7035     Last metabolic panel Lab Results  Component Value Date   NA 138 04/01/2021   K 3.0 (L) 04/01/2021   CL 104 04/01/2021   CO2 21 (L) 04/01/2021   BUN 10 04/01/2021   CREATININE 0.89 04/01/2021   GLUCOSE 96 04/01/2021   GFRNONAA >60 04/01/2021   GFRAA 105 08/22/2020   CALCIUM 7.7 (L) 04/01/2021   PROT 5.1 (L) 04/01/2021   ALBUMIN 2.1 (L) 04/01/2021   LABGLOB 2.3 02/01/2021   AGRATIO 1.1 02/01/2021   BILITOT 0.8 04/01/2021   ALKPHOS 103 04/01/2021   AST 39 04/01/2021   ALT 34 04/01/2021   ANIONGAP 13 04/01/2021    CBG (last 3)  No results for input(s): GLUCAP in the last 72 hours.   GFR: Estimated Creatinine Clearance: 50.3 mL/min (by C-G formula based on SCr of 0.89 mg/dL).  Coagulation Profile: No results for input(s): INR, PROTIME in the last 168 hours.  Recent Results (from the past 240 hour(s))  Resp Panel by RT-PCR (Flu A&B, Covid) Nasopharyngeal Swab     Status: None   Collection Time: 03/31/21  8:44 PM   Specimen: Nasopharyngeal Swab; Nasopharyngeal(NP) swabs in vial transport medium  Result Value Ref Range Status   SARS Coronavirus 2 by RT PCR NEGATIVE NEGATIVE Final    Comment: (NOTE) SARS-CoV-2 target nucleic acids are NOT DETECTED.  The SARS-CoV-2 RNA is generally detectable in upper respiratory specimens during the acute phase of infection. The lowest concentration of SARS-CoV-2 viral copies this assay can detect is 138 copies/mL. A negative result does not preclude SARS-Cov-2 infection and should not be used as the sole  basis for treatment or other patient management decisions. A negative result may occur with  improper specimen collection/handling, submission of specimen other than nasopharyngeal swab, presence of viral mutation(s) within the areas targeted by this assay, and inadequate number of viral copies(<138 copies/mL). A negative result must be combined with clinical observations, patient history, and epidemiological information. The expected result is Negative.  Fact Sheet for Patients:  EntrepreneurPulse.com.au  Fact Sheet for Healthcare Providers:  IncredibleEmployment.be  This test is no t yet approved or cleared by the [place] FDA and  has been authorized for detection and/or diagnosis of SARS-CoV-2 by FDA under an Emergency Use Authorization (EUA). This EUA will remain  in effect (meaning this test can be used) for the duration of the COVID-19 declaration under Section 564(b)(1) of the Act, 21 U.S.C.section 360bbb-3(b)(1), unless the authorization is terminated  or revoked sooner.       Influenza A by PCR NEGATIVE NEGATIVE Final   Influenza B by PCR NEGATIVE NEGATIVE Final    Comment: (NOTE) The Xpert Xpress SARS-CoV-2/FLU/RSV plus assay is intended as an aid in the diagnosis of influenza from Nasopharyngeal swab specimens and should not be used as  a sole basis for treatment. Nasal washings and aspirates are unacceptable for Xpert Xpress SARS-CoV-2/FLU/RSV testing.  Fact Sheet for Patients: EntrepreneurPulse.com.au  Fact Sheet for Healthcare Providers: IncredibleEmployment.be  This test is not yet approved or cleared by the [place] FDA and has been authorized for detection and/or diagnosis of SARS-CoV-2 by FDA under an Emergency Use Authorization (EUA). This EUA will remain in effect (meaning this test can be used) for the duration of the COVID-19 declaration under Section 564(b)(1) of the Act,  21 U.S.C. section 360bbb-3(b)(1), unless the authorization is terminated or revoked.  Performed at [facility], [place] [address], [place], [place] [address]   Urine Culture     Status: None   Collection Time: 04/01/21  9:07 AM   Specimen: In/Out Cath Urine  Result Value Ref Range Status   Specimen Description   Final    IN/OUT CATH URINE Performed at [place] [address], [place], [place] [address]    Special Requests   Final    NONE Performed at [facility], [place] [address] [place], [place] [address]    Culture   Final    NO GROWTH Performed at [facility], [place] [address] [place], [place] [address]    Report Status 04/02/2021 FINAL  Final        Radiology Studies: CT ABDOMEN PELVIS W CONTRAST  Result Date: 03/31/2021 CLINICAL DATA:  Acute abdominal pain. EXAM: CT ABDOMEN AND PELVIS WITH CONTRAST TECHNIQUE: Multidetector CT imaging of the abdomen and pelvis was performed using the standard protocol following bolus administration of intravenous contrast. CONTRAST:  5mL OMNIPAQUE IOHEXOL 350 MG/ML SOLN COMPARISON:  Abdominal ultrasound dated 10/27/2015. FINDINGS: Lower chest: There is mild bibasilar atelectasis. The heart is enlarged. Hepatobiliary: No focal liver abnormality is seen. No gallstones, gallbladder wall thickening, or biliary dilatation. Pancreas: Unremarkable. No pancreatic ductal dilatation or surrounding inflammatory changes. Spleen: Normal in size without focal abnormality. Adrenals/Urinary Tract: Adrenal glands are unremarkable. Kidneys are normal, without renal calculi, focal lesion, or hydronephrosis. Bladder is unremarkable. Stomach/Bowel: There is edema and mild thickening of the wall of the gastric fundus with hyperenhancement of the mucosa which may represent gastritis. Appendix appears normal. There is colonic diverticulosis without evidence of diverticulitis. No evidence of bowel wall  thickening, distention, or inflammatory changes. Vascular/Lymphatic: Aortic atherosclerosis. No enlarged abdominal or pelvic lymph nodes. Reproductive: A mass in the left aspect of the uterus measures 3.0 x 2.5 cm. The adnexa appear normal. Other: No abdominal wall hernia or abnormality. No abdominopelvic ascites. Anasarca and diffuse muscle wasting are noted. Musculoskeletal: There are chronic appearing compression deformities with approximately 25% height loss at L3 and 50% height loss at L4. Degenerative changes are seen in the spine. IMPRESSION: 1. Findings which may reflect gastritis. 2. Mass in the left aspect of the uterus likely represents a uterine fibroid. 3. Chronic appearing compression deformities of L3 and L4. Aortic Atherosclerosis (ICD10-I70.0). Electronically Signed   By: Zerita Boers M.D.   On: 03/31/2021 16:18   VAS [place] ABI WITH/WO TBI  Result Date: 04/02/2021  LOWER EXTREMITY DOPPLER STUDY Patient Name:  Yina Montemurro  Date of Exam:   04/02/2021 Medical Rec #: [id number]           Accession #:    [id number] Date of Birth: 1951-01-09          Patient Gender: F Patient Age:   40 years Exam Location:  [facility] Procedure:  VAS [place] ABI WITH/WO TBI Referring Phys: Margalit Leece --------------------------------------------------------------------------------  Indications: Peripheral artery disease. High Risk Factors: Hypertension.  Comparison Study: No prior studies. Performing Technologist: Carlos Levering RVT  Examination Guidelines: A complete evaluation includes at minimum, Doppler waveform signals and systolic blood pressure reading at the level of bilateral brachial, anterior tibial, and posterior tibial arteries, when vessel segments are accessible. Bilateral testing is considered an integral part of a complete examination. Photoelectric Plethysmograph (PPG) waveforms and toe systolic pressure readings are included as required and additional duplex testing as needed. Limited  examinations for reoccurring indications may be performed as noted.  ABI Findings: +---------+------------------+-----+---------+--------+ Right    Rt Pressure (mmHg)IndexWaveform Comment  +---------+------------------+-----+---------+--------+ Brachial 135                    triphasic         +---------+------------------+-----+---------+--------+ PTA      122               0.90 biphasic          +---------+------------------+-----+---------+--------+ DP       116               0.86 biphasic          +---------+------------------+-----+---------+--------+ Great Toe47                0.35                   +---------+------------------+-----+---------+--------+ +---------+------------------+-----+---------+-------+ Left     Lt Pressure (mmHg)IndexWaveform Comment +---------+------------------+-----+---------+-------+ Brachial 130                    triphasic        +---------+------------------+-----+---------+-------+ PTA      162               1.20 biphasic         +---------+------------------+-----+---------+-------+ DP       165               1.22 triphasic        +---------+------------------+-----+---------+-------+ Great Toe59                0.44                  +---------+------------------+-----+---------+-------+ +-------+-----------+-----------+------------+------------+ ABI/TBIToday's ABIToday's TBIPrevious ABIPrevious TBI +-------+-----------+-----------+------------+------------+ Right  0.9        0.35                                +-------+-----------+-----------+------------+------------+ Left   1.22       0.44                                +-------+-----------+-----------+------------+------------+  Summary: Right: Resting right ankle-brachial index indicates mild right lower extremity arterial disease. The right toe-brachial index is abnormal. Left: Resting left ankle-brachial index is within normal range. No evidence  of significant left lower extremity arterial disease. The left toe-brachial index is abnormal.  *See table(s) above for measurements and observations.     Preliminary    [place] EKG SITE RITE  Result Date: 04/01/2021 If Site Rite image not attached, placement could not be confirmed due to current cardiac rhythm.       Scheduled Meds:  chlorhexidine  15 mL Mouth Rinse BID   enoxaparin (LOVENOX) injection  40 mg Subcutaneous Q24H  feeding supplement  1 Container Oral TID BM   magnesium oxide  400 mg Oral QPM   mouth rinse  15 mL Mouth Rinse q12n4p   multivitamin with minerals  1 tablet Oral Daily   pantoprazole (PROTONIX) IV  40 mg Intravenous Q12H   sertraline  25 mg Oral Daily   Continuous Infusions:  lactated ringers 125 mL/hr at 04/02/21 0403     LOS: 1 day     Cordelia Poche, MD [facility] 04/02/2021, 10:36 AM  If 7PM-7AM, please contact night-coverage [email or web address]

## 2021-04-02 NOTE — Progress Notes (Addendum)
Initial Nutrition Assessment  DOCUMENTATION CODES:  Severe malnutrition in context of chronic illness  INTERVENTION:  Discontinue Boost Breeze TID.  Add Ensure Plus High Protein po TID, each supplement provides 350 kcal and 20 grams of protein.   Continue MVI with minerals daily.  Encourage PO intake.  Recommend checking Vitamin K and C labs.  Consider nutrition support of PO intake and/or vomiting does not improve.  NUTRITION DIAGNOSIS:  Severe Malnutrition related to chronic illness (chronic emesis) as evidenced by percent weight loss, energy intake < or equal to 75% for > or equal to 1 month.  GOAL:  Patient will meet greater than or equal to 90% of their needs  MONITOR:  PO intake, Supplement acceptance, Labs, Weight trends, I & O's  REASON FOR ASSESSMENT:  Malnutrition Screening Tool    ASSESSMENT:  70 yo female with a PMH of iron overload syndrome, GI bleeding, and HTN who presents with gastritis.  Pt ate 40% of lunch today.  Spoke with pt at bedside. Pt reports having trouble keeping food down. It has been really bad for the past month, but she has struggled since 08/2020.   She reports that nothing tastes good and she vomits often. She has not been able to eat well in several months per husband.  Per Epic, pt has lost ~13 lbs (9.4%) in the last 8 months, which is severe and significant for the time frame. Pt currently with BLE and BUE edema.  On exam, RD noticed what appeared to be petechiae on the patient's skin. Recommend checking Vitamin C and K labs given patient having chronic vomiting and poor PO intake for the last several months. Discussed this recommendation with MD via secure chat.  Patient interested in feeding tube if needs cannot be met PO. Discussed trial of PO first before deciding to start tube feeding. Pt agreeable.  Discontinue Boost Breeze TID. Add Ensure TID. Continue MVI with minerals.   Supplements: Boost Breeze TID  Medications:  reviewed; Mag-Ox, MVI with minerals, Protonix BID, LR @ 125 ml/hr  Labs: reviewed; K 5.8 (H - trending up)  NUTRITION - FOCUSED PHYSICAL EXAM: Flowsheet Row Most Recent Value  Orbital Region Mild depletion  Upper Arm Region No depletion  Thoracic and Lumbar Region No depletion  Buccal Region No depletion  Temple Region Mild depletion  Clavicle Bone Region No depletion  Clavicle and Acromion Bone Region No depletion  Scapular Bone Region No depletion  Dorsal Hand No depletion  Patellar Region No depletion  Anterior Thigh Region No depletion  Posterior Calf Region No depletion  Edema (RD Assessment) Mild  [BUE, BLE]  Hair Reviewed  Eyes Reviewed  Mouth Reviewed  Skin Reviewed  [Petechiae throughout body]  Nails Reviewed   Diet Order:   Diet Order             DIET SOFT Room service appropriate? Yes; Fluid consistency: Thin  Diet effective now                  EDUCATION NEEDS:  Education needs have been addressed  Skin:  Skin Assessment: Reviewed RN Assessment  Last BM:  04/01/21 - Type 6, green/brown, medium  Height:  Ht Readings from Last 1 Encounters:  03/31/21 $RemoveB'5\' 1"'[id number]$  (1.549 m)   Weight:  Wt Readings from Last 1 Encounters:  03/31/21 58.1 kg   BMI:  Body mass index is 24.2 kg/m.  Estimated Nutritional Needs:  Kcal:  1750-1950 Protein:  75-90 grams Fluid:  >1.75 L  Derrel Nip, RD, LDN (she/her/hers) Registered Dietitian I After-Hours/Weekend Pager # in [place]

## 2021-04-02 NOTE — Progress Notes (Signed)
ABI's have been completed. Preliminary results can be found in CV Proc through chart review.   04/02/21 9:37 AM Debbie Barry RVT

## 2021-04-02 NOTE — Progress Notes (Signed)
At 2244, pt bladder scan 379 mL.  Provider notified.  In/out cath performed. 200 mL out, 49 residual per bladder scan.    Will continue to monitor.

## 2021-04-03 DIAGNOSIS — E43 Unspecified severe protein-calorie malnutrition: Secondary | ICD-10-CM | POA: Insufficient documentation

## 2021-04-03 LAB — BASIC METABOLIC PANEL
Anion gap: 8 (ref 5–15)
BUN: 7 mg/dL — ABNORMAL LOW (ref 8–23)
CO2: 20 mmol/L — ABNORMAL LOW (ref 22–32)
Calcium: 7.7 mg/dL — ABNORMAL LOW (ref 8.9–10.3)
Chloride: 108 mmol/L (ref 98–111)
Creatinine, Ser: 0.6 mg/dL (ref 0.44–1.00)
GFR, Estimated: >60 mL/min (ref >60)
Glucose, Bld: 81 mg/dL (ref 70–99)
Potassium: 4 mmol/L (ref 3.5–5.1)
Sodium: 136 mmol/L (ref 135–145)

## 2021-04-03 LAB — MITOCHONDRIAL ANTIBODIES: Mitochondrial M2 Ab, IgG: <20 Units (ref 0.0–20.0)

## 2021-04-03 LAB — ANA: Anti Nuclear Antibody (ANA): NEGATIVE

## 2021-04-03 MED ORDER — HYDROXYZINE HCL 25 MG PO TABS
50.0000 mg | ORAL_TABLET | Freq: Three times a day (TID) | ORAL | Status: DC
  Administered 2021-04-03 – 2021-04-04 (×4): 50 mg via ORAL
  Filled 2021-04-03 (×4): qty 2

## 2021-04-03 MED ORDER — ACETAMINOPHEN 325 MG PO TABS
650.0000 mg | ORAL_TABLET | Freq: Four times a day (QID) | ORAL | Status: DC | PRN
  Administered 2021-04-03: 650 mg via ORAL
  Filled 2021-04-03: qty 2

## 2021-04-03 MED ORDER — LIP MEDEX EX OINT
TOPICAL_OINTMENT | CUTANEOUS | Status: AC
  Filled 2021-04-03: qty 7

## 2021-04-03 NOTE — Plan of Care (Signed)

## 2021-04-03 NOTE — Progress Notes (Signed)
PROGRESS NOTE    Debbie Barry  EHM:[id number] DOB: Feb 10, 1951 DOA: 03/31/2021 PCP: Glendale Chard, MD   Brief Narrative: Debbie Barry is a 70 y.o. female with a history of hypertension, PAD, GI bleeding, gastritis, iron overload syndrome. Patient presented secondary to nausea, vomiting and abdominal pain with concern for acute gastritis. IV fluids and antiemetics initiated with improvement.   Assessment & Plan:   Principal Problem:   Gastritis Active Problems:   Essential hypertension, benign   Iron overload   Nausea and vomiting   Protein-calorie malnutrition, severe   Gastritis Nausea/vomiting Chronic disease with acute exacerbation. Suggested on CT scan. Patient also has diarrhea, so possible nausea/vomiting could be related to GI infection. Improving. -Protonix IV -IV fluids today since she had an episode of emesis -Zofran prn -Continue soft diet  Diarrhea On and off for weeks. Last bowel movement yesterday. GI pathogen panel and C. Difficile ordered.  Hypotension Likely hypovolemia in setting of above. Unlikely related to recent prednisone use since patient's blood pressure was significantly elevated on admission in addition to patient being on a short prednisone taper. Now resolved with adequate fluid resuscitation.  Macular rash Difficult to assess as patient has scratched many lesions. She states that they develop blister appearing lesions. Rash is on legs, arms, back. Patient was seen by PCP and prescribed prednisone. Possibly related to change in detergent. Symptomatic here with significant evidence of excoriation. Negative sed rate. -Continue benadryl prn, start Atarax 50 mg TID -Kenalog cream for spot treatments rather than whole body -Hold on steroid use after discussion with patient  Urinary retention Unknown etiology. She has required 2 in/out catheterizations to date. -Bladder scan q4 hours; foley catheter if recurrent urinary  retention  Iron overload Unclear etiology. Follows with Dr. Jana Hakim.   Multifocal atrial tachycardia Noted in the ED. Discussed with cardiology who recommended no follow-up. Magnesium replaced.  Primary hypertension Currently hypotensive. Amlodipine held.  Leg leg ulcer Slow to heal in setting of known PAD -WOC recommendations: Continue silicone foam dressing, change every 3 days.   PAD Poor pedal pulses noted. Likely contributing to leg wound. ABI suggest left > right disease. Patient states she was previously followed by vascular surgery who offered her stent placement for which she declined. May be beneficial for her to follow-up with vascular surgery as an outpatient.   DVT prophylaxis: Lovenox Code Status:   Code Status: Full Code Family Communication: None at bedside Disposition Plan: Discharge home likely in 1-3 days pending improvement of blood pressure, nausea/vomiting, diarrhea, ability to tolerate oral intake   Consultants:  None  Procedures:  None  Antimicrobials: None    Subjective: Emesis episode this morning but no more nausea.  Objective: Vitals:   04/02/21 1015 04/02/21 2037 04/03/21 0251 04/03/21 0400  BP: 135/74 (!) 154/97 121/70 109/67  Pulse: 81 93 91 87  Resp: 16 18 18 18   Temp:  98.2 F (36.8 C) 98.2 F (36.8 C) 98.6 F (37 C)  TempSrc:  Oral Oral Oral  SpO2:  100% 97% 99%  Weight:      Height:        Intake/Output Summary (Last 24 hours) at 04/03/2021 1413 Last data filed at 04/03/2021 1408 Gross per 24 hour  Intake 3064.15 ml  Output 351 ml  Net 2713.15 ml    Filed Weights   03/31/21 2347  Weight: 58.1 kg    Examination:  General exam: Appears calm and comfortable Respiratory system: Clear to auscultation. Respiratory effort normal.  Cardiovascular system: S1 & S2 heard, RRR. No murmurs, rubs, gallops or clicks. Gastrointestinal system: Abdomen is nondistended, soft and nontender. No organomegaly or masses felt. Normal  bowel sounds heard. Central nervous system: Alert and oriented. No focal neurological deficits. Musculoskeletal: No edema. No calf tenderness Skin: No cyanosis. Horizontal nail pitting noted Psychiatry: Judgement and insight appear normal. Mood & affect appropriate.     Data Reviewed: I have personally reviewed following labs and imaging studies  CBC Lab Results  Component Value Date   WBC 7.4 04/01/2021   RBC 4.59 04/01/2021   HGB 15.9 (H) 04/01/2021   HCT 45.6 04/01/2021   MCV 99.3 04/01/2021   MCH 34.6 (H) 04/01/2021   PLT 164 04/01/2021   MCHC 34.9 04/01/2021   RDW 16.6 (H) 04/01/2021   LYMPHSABS 0.7 03/31/2021   MONOABS 0.2 03/31/2021   EOSABS 0.0 03/31/2021   BASOSABS 0.0 16/04/9603     Last metabolic panel Lab Results  Component Value Date   NA 136 04/03/2021   K 4.0 04/03/2021   CL 108 04/03/2021   CO2 20 (L) 04/03/2021   BUN 7 (L) 04/03/2021   CREATININE 0.60 04/03/2021   GLUCOSE 81 04/03/2021   GFRNONAA >60 04/03/2021   GFRAA 105 08/22/2020   CALCIUM 7.7 (L) 04/03/2021   PROT 5.1 (L) 04/01/2021   ALBUMIN 2.1 (L) 04/01/2021   LABGLOB 2.3 02/01/2021   AGRATIO 1.1 02/01/2021   BILITOT 0.8 04/01/2021   ALKPHOS 103 04/01/2021   AST 39 04/01/2021   ALT 34 04/01/2021   ANIONGAP 8 04/03/2021    CBG (last 3)  No results for input(s): GLUCAP in the last 72 hours.   GFR: Estimated Creatinine Clearance: 55.9 mL/min (by C-G formula based on SCr of 0.6 mg/dL).  Coagulation Profile: No results for input(s): INR, PROTIME in the last 168 hours.  Recent Results (from the past 240 hour(s))  Resp Panel by RT-PCR (Flu A&B, Covid) Nasopharyngeal Swab     Status: None   Collection Time: 03/31/21  8:44 PM   Specimen: Nasopharyngeal Swab; Nasopharyngeal(NP) swabs in vial transport medium  Result Value Ref Range Status   SARS Coronavirus 2 by RT PCR NEGATIVE NEGATIVE Final    Comment: (NOTE) SARS-CoV-2 target nucleic acids are NOT DETECTED.  The SARS-CoV-2 RNA is  generally detectable in upper respiratory specimens during the acute phase of infection. The lowest concentration of SARS-CoV-2 viral copies this assay can detect is 138 copies/mL. A negative result does not preclude SARS-Cov-2 infection and should not be used as the sole basis for treatment or other patient management decisions. A negative result may occur with  improper specimen collection/handling, submission of specimen other than nasopharyngeal swab, presence of viral mutation(s) within the areas targeted by this assay, and inadequate number of viral copies(<138 copies/mL). A negative result must be combined with clinical observations, patient history, and epidemiological information. The expected result is Negative.  Fact Sheet for Patients:  EntrepreneurPulse.com.au  Fact Sheet for Healthcare Providers:  IncredibleEmployment.be  This test is no t yet approved or cleared by the [place] FDA and  has been authorized for detection and/or diagnosis of SARS-CoV-2 by FDA under an Emergency Use Authorization (EUA). This EUA will remain  in effect (meaning this test can be used) for the duration of the COVID-19 declaration under Section 564(b)(1) of the Act, 21 U.S.C.section 360bbb-3(b)(1), unless the authorization is terminated  or revoked sooner.       Influenza A by PCR NEGATIVE NEGATIVE Final  Influenza B by PCR NEGATIVE NEGATIVE Final    Comment: (NOTE) The Xpert Xpress SARS-CoV-2/FLU/RSV plus assay is intended as an aid in the diagnosis of influenza from Nasopharyngeal swab specimens and should not be used as a sole basis for treatment. Nasal washings and aspirates are unacceptable for Xpert Xpress SARS-CoV-2/FLU/RSV testing.  Fact Sheet for Patients: EntrepreneurPulse.com.au  Fact Sheet for Healthcare Providers: IncredibleEmployment.be  This test is not yet approved or cleared by the [place] FDA and has been authorized for detection and/or diagnosis of SARS-CoV-2 by FDA under an Emergency Use Authorization (EUA). This EUA will remain in effect (meaning this test can be used) for the duration of the COVID-19 declaration under Section 564(b)(1) of the Act, 21 U.S.C. section 360bbb-3(b)(1), unless the authorization is terminated or revoked.  Performed at [facility], [place] [address] [place], [place] [address]   Urine Culture     Status: None   Collection Time: 04/01/21  9:07 AM   Specimen: In/Out Cath Urine  Result Value Ref Range Status   Specimen Description   Final    IN/OUT CATH URINE Performed at [place] [address] [place], [place] [address]    Special Requests   Final    NONE Performed at [facility], [place] [address], [place], [place] [address]    Culture   Final    NO GROWTH Performed at [facility], [place] [address], [place], [place] [address]    Report Status 04/02/2021 FINAL  Final        Radiology Studies: VAS [place] ABI WITH/WO TBI  Result Date: 04/02/2021  LOWER EXTREMITY DOPPLER STUDY Patient Name:  Tashay Bozich  Date of Exam:   04/02/2021 Medical Rec #: [id number]           Accession #:    [id number] Date of Birth: 08/08/1950          Patient Gender: F Patient Age:   27 years Exam Location:  [facility] Procedure:      VAS [place] ABI WITH/WO TBI Referring Phys: Maliq Pilley --------------------------------------------------------------------------------  Indications: Peripheral artery disease. High Risk Factors: Hypertension.  Comparison Study: No prior studies. Performing Technologist: Carlos Levering RVT  Examination Guidelines: A complete evaluation includes at minimum, Doppler waveform signals and systolic blood pressure reading at the level of bilateral brachial, anterior tibial, and posterior tibial arteries, when vessel segments are accessible. Bilateral testing is  considered an integral part of a complete examination. Photoelectric Plethysmograph (PPG) waveforms and toe systolic pressure readings are included as required and additional duplex testing as needed. Limited examinations for reoccurring indications may be performed as noted.  ABI Findings: +---------+------------------+-----+---------+--------+ Right    Rt Pressure (mmHg)IndexWaveform Comment  +---------+------------------+-----+---------+--------+ Brachial 135                    triphasic         +---------+------------------+-----+---------+--------+ PTA      122               0.90 biphasic          +---------+------------------+-----+---------+--------+ DP       116               0.86 biphasic          +---------+------------------+-----+---------+--------+ Great Toe47                0.35                   +---------+------------------+-----+---------+--------+ +---------+------------------+-----+---------+-------+  Left     Lt Pressure (mmHg)IndexWaveform Comment +---------+------------------+-----+---------+-------+ Brachial 130                    triphasic        +---------+------------------+-----+---------+-------+ PTA      162               1.20 biphasic         +---------+------------------+-----+---------+-------+ DP       165               1.22 triphasic        +---------+------------------+-----+---------+-------+ Great Toe59                0.44                  +---------+------------------+-----+---------+-------+ +-------+-----------+-----------+------------+------------+ ABI/TBIToday's ABIToday's TBIPrevious ABIPrevious TBI +-------+-----------+-----------+------------+------------+ Right  0.9        0.35                                +-------+-----------+-----------+------------+------------+ Left   1.22       0.44                                +-------+-----------+-----------+------------+------------+  Summary: Right:  Resting right ankle-brachial index indicates mild right lower extremity arterial disease. The right toe-brachial index is abnormal. Left: Resting left ankle-brachial index is within normal range. No evidence of significant left lower extremity arterial disease. The left toe-brachial index is abnormal.  *See table(s) above for measurements and observations.  Electronically signed by Servando Snare MD on 04/02/2021 at 4:52:40 PM.    Final    [place] EKG SITE RITE  Result Date: 04/01/2021 If Site Rite image not attached, placement could not be confirmed due to current cardiac rhythm.       Scheduled Meds:  chlorhexidine  15 mL Mouth Rinse BID   enoxaparin (LOVENOX) injection  40 mg Subcutaneous Q24H   feeding supplement  237 mL Oral TID BM   hydrOXYzine  50 mg Oral TID   magnesium oxide  400 mg Oral QPM   mouth rinse  15 mL Mouth Rinse q12n4p   multivitamin with minerals  1 tablet Oral Daily   pantoprazole (PROTONIX) IV  40 mg Intravenous Q12H   sertraline  25 mg Oral Daily   Continuous Infusions:  lactated ringers 125 mL/hr at 04/03/21 1408     LOS: 2 days     Cordelia Poche, MD [facility] 04/03/2021, 2:13 PM  If 7PM-7AM, please contact night-coverage [email or web address]

## 2021-04-04 ENCOUNTER — Telehealth: Payer: Self-pay

## 2021-04-04 LAB — BASIC METABOLIC PANEL
Anion gap: 9 (ref 5–15)
BUN: 6 mg/dL — ABNORMAL LOW (ref 8–23)
CO2: 21 mmol/L — ABNORMAL LOW (ref 22–32)
Calcium: 7.9 mg/dL — ABNORMAL LOW (ref 8.9–10.3)
Chloride: 109 mmol/L (ref 98–111)
Creatinine, Ser: 0.57 mg/dL (ref 0.44–1.00)
GFR, Estimated: >60 mL/min (ref >60)
Glucose, Bld: 66 mg/dL — ABNORMAL LOW (ref 70–99)
Potassium: 4.7 mmol/L (ref 3.5–5.1)
Sodium: 139 mmol/L (ref 135–145)

## 2021-04-04 MED ORDER — ENSURE ENLIVE PO LIQD: 237.0000 mL | Freq: Three times a day (TID) | ORAL | Status: DC

## 2021-04-04 MED ORDER — ONDANSETRON 4 MG PO TBDP: 4.0000 mg | ORAL_TABLET | Freq: Three times a day (TID) | ORAL | 0 refills | Status: DC | PRN

## 2021-04-04 MED ORDER — HYDROXYZINE HCL 50 MG PO TABS: 50.0000 mg | ORAL_TABLET | Freq: Three times a day (TID) | ORAL | 0 refills | Status: DC

## 2021-04-04 MED ORDER — HYDROCODONE-ACETAMINOPHEN 5-325 MG PO TABS: 1.0000 | ORAL_TABLET | Freq: Four times a day (QID) | ORAL | 0 refills | Status: AC | PRN

## 2021-04-04 MED ORDER — PANTOPRAZOLE SODIUM 40 MG PO TBEC: 40.0000 mg | DELAYED_RELEASE_TABLET | Freq: Every day | ORAL | 0 refills | Status: DC

## 2021-04-04 NOTE — Progress Notes (Signed)
Physical Therapy Treatment Patient Details Name: Debbie Barry MRN: [id number] DOB: 1950-07-26 Today's Date: 04/04/2021   History of Present Illness Patient is a 70 year old female who presented secondary to nausea, vomiting and abdominal pain with concern for acute gastritis.  QZE:SPQZ overload syndrome, GI bleeding, HTN, PAD, GI bleeding, gastritis, iron overload syndrome.    PT Comments    Pt assisted with ambulating short distance in hallway.  Pt overall min/guard assist today however requiring increased effort and time to perform bed mobility.  Pt feels spouse can provide a little assist at home.  Pt may need some assist for sit to stands however pt has stated she has needed help with this in the past.  Pt would benefit from [facility] unless spouse feels he cannot assist with mobility upon d/c, in which case, pt may need SNF.    Recommendations for follow up therapy are one component of a multi-disciplinary discharge planning process, led by the attending physician.  Recommendations may be updated based on patient status, additional functional criteria and insurance authorization.  Follow Up Recommendations  Home health PT;Supervision for mobility/OOB     Equipment Recommendations  None recommended by PT    Recommendations for Other Services       Precautions / Restrictions Precautions Precautions: Fall Precaution Comments: monitor HR     Mobility  Bed Mobility Overal bed mobility: Needs Assistance Bed Mobility: Supine to Sit;Sit to Supine     Supine to sit: Min guard;HOB elevated Sit to supine: Min guard   General bed mobility comments: increased effort and time however able to perform without physical assist, cues for technique    Transfers Overall transfer level: Needs assistance Equipment used: Rolling walker (2 wheeled) Transfers: Sit to/from Stand Sit to Stand: Min guard;From elevated surface         General transfer comment: verbal cues for weight  shifting and extension, pt able to perform min/guard with slightly elevated surface today  Ambulation/Gait Ambulation/Gait assistance: Min guard Gait Distance (Feet): 80 Feet Assistive device: Rolling walker (2 wheeled) Gait Pattern/deviations: Step-through pattern;Decreased stride length Gait velocity: decr   General Gait Details: verbal cues for safe use of RW, HR 129 bpm, distance limited by fatigue   Stairs             Wheelchair Mobility    Modified Rankin (Stroke Patients Only)       Balance Overall balance assessment: Needs assistance Sitting-balance support: Feet supported Sitting balance-Leahy Scale: Good     Standing balance support: Single extremity supported;Bilateral upper extremity supported Standing balance-Leahy Scale: Poor Standing balance comment: reliant on UE support                            Cognition Arousal/Alertness: Awake/alert Behavior During Therapy: WFL for tasks assessed/performed Overall Cognitive Status: Within Functional Limits for tasks assessed                                        Exercises      General Comments General comments (skin integrity, edema, etc.): unable to obtain accurate reading of HR during session RN notified leads may need to be replaced      Pertinent Vitals/Pain Pain Assessment: No/denies pain Faces Pain Scale: Hurts a little bit Pain Location: stomach Pain Descriptors / Indicators: Aching Pain Intervention(s): Monitored during session  Home Living                      Prior Function            PT Goals (current goals can now be found in the care plan section) Acute Rehab PT Goals Patient Stated Goal: to get stronger Progress towards PT goals: Progressing toward goals    Frequency    Min 3X/week      PT Plan Current plan remains appropriate    Co-evaluation              AM-PAC PT "6 Clicks" Mobility   Outcome Measure  Help needed  turning from your back to your side while in a flat bed without using bedrails?: A Little Help needed moving from lying on your back to sitting on the side of a flat bed without using bedrails?: A Little Help needed moving to and from a bed to a chair (including a wheelchair)?: A Little Help needed standing up from a chair using your arms (e.g., wheelchair or bedside chair)?: A Little Help needed to walk in hospital room?: A Little Help needed climbing 3-5 steps with a railing? : A Little 6 Click Score: 18    End of Session Equipment Utilized During Treatment: Gait belt Activity Tolerance: Patient tolerated treatment well Patient left: with call bell/phone within reach;in bed   PT Visit Diagnosis: Difficulty in walking, not elsewhere classified (R26.2);Muscle weakness (generalized) (M62.81)     Time: 1411-1430 PT Time Calculation (min) (ACUTE ONLY): 19 min  Charges:  $Gait Training: 8-22 mins                    Jannette Spanner PT, DPT Acute Rehabilitation Services Pager: [phone] Office: [place] 04/04/2021, 3:00 PM

## 2021-04-04 NOTE — Care Management Important Message (Signed)
Important Message  Patient Details IM Letter given to the Patient. Name: Debbie Barry MRN: [id number] Date of Birth: 03/18/1951   Medicare Important Message Given:  Yes     Kerin Salen 04/04/2021, 1:42 PM

## 2021-04-04 NOTE — Discharge Instructions (Signed)
[place] were in the hospital because of nausea/vomiting/diarrhea which has improved. I will discharge you with nausea medication. You were also noted to have urinary retention; this appears to be a long time issue that has gone untreated from what you discussed with me. You have had a foley catheter placed and will need a Urologist to follow-up with you.

## 2021-04-04 NOTE — Progress Notes (Signed)
0730- Pt lying in bed in nad. Pt is a & o x4. No complaints voiced. Call bell within reach.  1200- Pt eating lunch. NAD noted. No complaints voiced. Will continue to monitor.  1700- 78fr foley catheter placed without difficulty by this nurse and another staff member. Pt tolerated well. 632ml immediate urinary output noted.   1845- #22g IV removed from right arm with cath intact. VS: 149/96 (108), 116, 93% on room air. Pt discharged home. Taken to front of hospital via w/c by Jarrett Soho, NT to meet pt's husband. Pt in nad at time of discharge. Instructions reviewed with pt and all questions and concerns addressed.

## 2021-04-04 NOTE — Progress Notes (Signed)
Occupational Therapy Treatment Patient Details Name: Debbie Barry MRN: [id number] DOB: 1950/08/03 Today's Date: 04/04/2021   History of present illness Patient is a 70 year old female who presented secondary to nausea, vomiting and abdominal pain with concern for acute gastritis.  IOE:VOJJ overload syndrome, GI bleeding, HTN, PAD, GI bleeding, gastritis, iron overload syndrome.   OT comments  Patient requesting to use bathroom. Needing heavy mod A to power up to standing with cues to use momentum and push from surface vs pulling on walker to stand. Once upright patient is min G to ambulate to/from bathroom with rolling walker. Patient is able to doff soiled underwear/socks and wash upper/lower legs while seated on toilet at set up level. Patient is able to thread Bilateral legs into clean underwear however starts to feel fatigued needing assist to don socks and complete pulling underwear up over hips in standing. Also needing total A for perianal care in standing. Patient supervision standing sink side for oral care/hand hygiene. If patient's spouse can provide assist with sit to stands and peri care could D/C home, if not patient will need short term rehab before going home.    Recommendations for follow up therapy are one component of a multi-disciplinary discharge planning process, led by the attending physician.  Recommendations may be updated based on patient status, additional functional criteria and insurance authorization.    Follow Up Recommendations  Home health OT;Other (comment) (vs SNF if spouse unable to provide current A levels)    Equipment Recommendations  None recommended by OT       Precautions / Restrictions Precautions Precautions: Fall Precaution Comments: monitor HR       Mobility Bed Mobility Overal bed mobility: Needs Assistance Bed Mobility: Supine to Sit;Sit to Supine     Supine to sit: Min assist;HOB elevated Sit to supine: Min assist   General bed  mobility comments: min A to upright trunk to sitting and then to lift legs onto bed at end of session    Transfers Overall transfer level: Needs assistance Equipment used: Rolling walker (2 wheeled) Transfers: Sit to/from Stand Sit to Stand: Mod assist         General transfer comment: heavy mod A and cues to use momentum + push from surface to stand both from edge of bed and from elevated toilet height    Balance Overall balance assessment: Needs assistance Sitting-balance support: Feet supported Sitting balance-Leahy Scale: Good     Standing balance support: Single extremity supported;Bilateral upper extremity supported Standing balance-Leahy Scale: Poor Standing balance comment: reliant on UE support                           ADL either performed or assessed with clinical judgement   ADL Overall ADL's : Needs assistance/impaired     Grooming: Oral care;Wash/dry hands;Supervision/safety;Standing Grooming Details (indicate cue type and reason): assist open mouth wash, supervision with patient holding onto sink for support to complete g/h tasks     Lower Body Bathing: Set up;Sitting/lateral leans Lower Body Bathing Details (indicate cue type and reason): to wash upper and lower legs, feet while seated on toilet due to incontinent of stool before making it to toilet     Lower Body Dressing: Minimal assistance Lower Body Dressing Details (indicate cue type and reason): patient able to doff soiled mesh underwear and doff socks 75% of the way that were soiled from stool. Fatigues after bowel movement, washing legs, donning clean  mesh udnerwear needing assist to don socks while seated on toilet. Toilet Transfer: Moderate assistance;Cueing for safety;Cueing for sequencing;Ambulation;RW;Grab bars (commode over toilet) Toilet Transfer Details (indicate cue type and reason): heavy mod A to power up to standing, cue to use momentum and push from commode frame. Toileting-  Clothing Manipulation and Hygiene: Maximal assistance;Sitting/lateral lean;Sit to/from stand Toileting - Clothing Manipulation Details (indicate cue type and reason): fatigued after prolonged sitting on toilet needing total A for perianal care and min A to pull up underwear over hips     Functional mobility during ADLs: Moderate assistance;Rolling walker;Cueing for sequencing;Cueing for safety;Min guard (mod to stand, min G in ambulation) General ADL Comments: patient does need significant assistance to power up to standing even from elevated toilet height and use of grab bars, reports spouse would be able to assist her with this at home. also fatigues with toileting and LB ADLs      Cognition Arousal/Alertness: Awake/alert Behavior During Therapy: WFL for tasks assessed/performed Overall Cognitive Status: Within Functional Limits for tasks assessed                                                General Comments unable to obtain accurate reading of HR during session RN notified leads may need to be replaced    Pertinent Vitals/ Pain       Pain Assessment: Faces Faces Pain Scale: Hurts a little bit Pain Location: stomach Pain Descriptors / Indicators: Aching Pain Intervention(s): Monitored during session         Frequency  Min 2X/week        Progress Toward Goals  OT Goals(current goals can now be found in the care plan section)  Progress towards OT goals: Progressing toward goals  Acute Rehab OT Goals Patient Stated Goal: to get stronger OT Goal Formulation: With patient Time For Goal Achievement: 04/16/21 Potential to Achieve Goals: Good ADL Goals Pt Will Transfer to Toilet: with min assist;squat pivot transfer;bedside commode Pt Will Perform Toileting - Clothing Manipulation and hygiene: sit to/from stand;with min assist Additional ADL Goal #1: patient to complete UB/LB dressing seated with MI after set up to increase functional activity tolerance   Plan Discharge plan needs to be updated       AM-PAC OT "6 Clicks" Daily Activity     Outcome Measure   Help from another person eating meals?: A Little Help from another person taking care of personal grooming?: A Little Help from another person toileting, which includes using toliet, bedpan, or urinal?: A Lot Help from another person bathing (including washing, rinsing, drying)?: A Little Help from another person to put on and taking off regular upper body clothing?: A Lot Help from another person to put on and taking off regular lower body clothing?: A Little 6 Click Score: 16    End of Session Equipment Utilized During Treatment: Rolling walker  OT Visit Diagnosis: Unsteadiness on feet (R26.81);Other abnormalities of gait and mobility (R26.89);History of falling (Z91.81);Muscle weakness (generalized) (M62.81)   Activity Tolerance Patient tolerated treatment well   Patient Left in bed;with call bell/phone within reach;with bed alarm set   Nurse Communication Mobility status        Time: 9629-5284 OT Time Calculation (min): 40 min  Charges: OT General Charges $OT Visit: 1 Visit OT Treatments $Self Care/Home Management : 38-52 mins  Delbert Phenix OT  OT pager: [place] 04/04/2021, 12:32 PM

## 2021-04-04 NOTE — Discharge Summary (Signed)
Physician Discharge Summary  Debbie Barry JEH:[id number] DOB: Apr 13, 1951 DOA: 03/31/2021  PCP: Glendale Chard, MD  Admit date: 03/31/2021 Discharge date: 04/04/2021  Admitted From: Home Disposition: Home  Recommendations for Outpatient Follow-up:  Follow up with PCP in 1 week Follow up with Urology for voiding trial Please obtain BMP/CBC in one week Please follow up on the following pending results: None  Home Health: PT, OT, RN Equipment/Devices: Foley catheter  Discharge Condition: Stable CODE STATUS: Full Code Diet recommendation: Soft diet   Brief/Interim Summary:  Admission HPI written by Etta Quill, DO   HPI: Debbie Barry is a 70 y.o. female with medical history significant of HTN, PAD, GIB earlier this year, iron overload syndrome of unclear etiology being seen by heme/onc.   Pt presents to ED with ~3 week history of intermittent N/V, loose stools.  Symptoms persistent, worsening, now severe.  Nothing makes better or worse.   Now unable to tolerate POs.   Epigastric abd pain.   Hospital course:  Diarrhea On and off for weeks. Last bowel movement yesterday. GI pathogen panel and C. Difficile ordered.  Gastritis Treated with Protonix IV with improvement. Zofran prn for nausea. Patient tolerating diet with some intermittent nausea and infrequent vomiting. GI not consulted this admission. Discharge with Protonix and Zofran prn.   Hypotension Likely hypovolemia in setting of above. Unlikely related to recent prednisone use since patient's blood pressure was significantly elevated on admission in addition to patient being on a short prednisone taper. Now resolved with adequate fluid resuscitation.   Macular rash Difficult to assess as patient has scratched many lesions. She states that they develop blister appearing lesions. Rash is on legs, arms, back. Patient was seen by PCP and prescribed prednisone. Possibly related to change in detergent.  Symptomatic here with significant evidence of excoriation. Negative sed rate, ANA, mitochondrial ab. Patient started on Atarax 50 mg TID with some improvement in itching. Patient has a plan for follow-up with dermatology prior to admission. Will likely need biopsy to diagnose.   Urinary retention Unknown etiology. She has required multiple in and out catheterizations but continues to have urinary retention. Urine culture is negative. Foley catheter placed on 10/5 prior to discharge. Patient scheduled for Urology follow-up and voiding trial.   Iron overload Unclear etiology. Follows with Dr. Jana Hakim.    Multifocal atrial tachycardia Noted in the ED. Discussed with cardiology who recommended no follow-up. Magnesium replaced.   Primary hypertension Currently hypotensive. Amlodipine held.   Leg leg ulcer Slow to heal in setting of known PAD. Continue silicone foam dressing, change every 3 days.    PAD Poor pedal pulses noted. Likely contributing to leg wound. ABI suggest left > right disease. Patient states she was previously followed by vascular surgery who offered her stent placement for which she declined. May be beneficial for her to follow-up with vascular surgery as an outpatient.   Discharge Diagnoses:  Principal Problem:   Gastritis Active Problems:   Essential hypertension, benign   Iron overload   Nausea and vomiting   Protein-calorie malnutrition, severe    Discharge Instructions   Allergies as of 04/04/2021   No Known Allergies      Medication List     STOP taking these medications    acetaminophen 650 MG CR tablet Commonly known as: TYLENOL   amLODipine 5 MG tablet Commonly known as: NORVASC   diphenhydrAMINE 25 MG tablet Commonly known as: BENADRYL   Magnesium 400 MG Caps  TAKE these medications    DIUREX PO Take 2 tablets by mouth in the morning and at bedtime.   feeding supplement Liqd Take 237 mLs by mouth 3 (three) times daily  between meals.   HYDROcodone-acetaminophen 5-325 MG tablet Commonly known as: NORCO/VICODIN Take 1 tablet by mouth every 6 (six) hours as needed for up to 5 days for moderate pain.   hydrOXYzine 50 MG tablet Commonly known as: ATARAX/VISTARIL Take 1 tablet (50 mg total) by mouth 3 (three) times daily.   multivitamin with minerals Tabs tablet Take 1 tablet by mouth daily.   ondansetron 4 MG disintegrating tablet Commonly known as: Zofran ODT Take 1 tablet (4 mg total) by mouth every 8 (eight) hours as needed for nausea or vomiting.   pantoprazole 40 MG tablet Commonly known as: PROTONIX Take 1 tablet (40 mg total) by mouth 2 (two) times daily before a meal. Take 40mg  twice daily for 2 weeks, then decrease to 40mg  once daily What changed:  when to take this additional instructions   potassium chloride SA 20 MEQ tablet Commonly known as: KLOR-CON Take 1 tablet (20 mEq total) by mouth 2 (two) times daily. What changed: when to take this   sertraline 25 MG tablet Commonly known as: ZOLOFT Take 1 tablet (25 mg total) by mouth daily.   triamcinolone ointment 0.5 % Commonly known as: KENALOG Apply 1 application topically 2 (two) times daily. What changed:  when to take this reasons to take this   VISINE OP Place 2 drops into both eyes daily as needed (for dry/irritated eyes).   Vitamin D3 125 MCG (5000 UT) Caps Take 5,000 Units by mouth daily.        Follow-up Information     Glendale Chard, MD. Schedule an appointment as soon as possible for a visit in 1 week(s).   Specialty: Internal Medicine Why: For hospital follow-up Contact information: [address] [place] [address] [phone]         Karen Kays, NP Follow up on 04/23/2021.   Specialty: Nurse Practitioner Why: 9:45 AM for a voiding trial. Call for possible sooner appointment Contact information: [address] [place] [address] [phone]                 No Known Allergies  Consultations: None   Procedures/Studies: CT ABDOMEN PELVIS W CONTRAST  Result Date: 03/31/2021 CLINICAL DATA:  Acute abdominal pain. EXAM: CT ABDOMEN AND PELVIS WITH CONTRAST TECHNIQUE: Multidetector CT imaging of the abdomen and pelvis was performed using the standard protocol following bolus administration of intravenous contrast. CONTRAST:  69mL OMNIPAQUE IOHEXOL 350 MG/ML SOLN COMPARISON:  Abdominal ultrasound dated 10/27/2015. FINDINGS: Lower chest: There is mild bibasilar atelectasis. The heart is enlarged. Hepatobiliary: No focal liver abnormality is seen. No gallstones, gallbladder wall thickening, or biliary dilatation. Pancreas: Unremarkable. No pancreatic ductal dilatation or surrounding inflammatory changes. Spleen: Normal in size without focal abnormality. Adrenals/Urinary Tract: Adrenal glands are unremarkable. Kidneys are normal, without renal calculi, focal lesion, or hydronephrosis. Bladder is unremarkable. Stomach/Bowel: There is edema and mild thickening of the wall of the gastric fundus with hyperenhancement of the mucosa which may represent gastritis. Appendix appears normal. There is colonic diverticulosis without evidence of diverticulitis. No evidence of bowel wall thickening, distention, or inflammatory changes. Vascular/Lymphatic: Aortic atherosclerosis. No enlarged abdominal or pelvic lymph nodes. Reproductive: A mass in the left aspect of the uterus measures 3.0 x 2.5 cm. The adnexa appear normal. Other: No  abdominal wall hernia or abnormality. No abdominopelvic ascites. Anasarca and diffuse muscle wasting are noted. Musculoskeletal: There are chronic appearing compression deformities with approximately 25% height loss at L3 and 50% height loss at L4. Degenerative changes are seen in the spine. IMPRESSION: 1. Findings which may reflect gastritis. 2. Mass in the left aspect of the uterus likely represents a uterine fibroid. 3. Chronic appearing  compression deformities of L3 and L4. Aortic Atherosclerosis (ICD10-I70.0). Electronically Signed   By: Zerita Boers M.D.   On: 03/31/2021 16:18   US Venous Img Lower Unilateral Left (DVT)  Result Date: 03/07/2021 CLINICAL DATA:  Left lower extremity pain and edema history of rash EXAM: Left LOWER EXTREMITY VENOUS DOPPLER ULTRASOUND TECHNIQUE: Gray-scale sonography with compression, as well as color and duplex ultrasound, were performed to evaluate the deep venous system(s) from the level of the common femoral vein through the popliteal and proximal calf veins. COMPARISON:  None. FINDINGS: VENOUS Normal compressibility of the common femoral, superficial femoral, and popliteal veins, as well as the visualized calf veins. Visualized portions of profunda femoral vein and great saphenous vein unremarkable. No filling defects to suggest DVT on grayscale or color Doppler imaging. Doppler waveforms show normal direction of venous flow, normal respiratory plasticity and response to augmentation. Limited views of the contralateral common femoral vein are unremarkable. OTHER None. Limitations: none IMPRESSION: Negative. Electronically Signed   By: Donavan Foil M.D.   On: 03/07/2021 23:14   VAS [place] ABI WITH/WO TBI  Result Date: 04/02/2021  LOWER EXTREMITY DOPPLER STUDY Patient Name:  Debbie Barry  Date of Exam:   04/02/2021 Medical Rec #: [id number]           Accession #:    [id number] Date of Birth: 1950-08-17          Patient Gender: F Patient Age:   34 years Exam Location:  [facility] Procedure:      VAS [place] ABI WITH/WO TBI Referring Phys: Jalil Lorusso --------------------------------------------------------------------------------  Indications: Peripheral artery disease. High Risk Factors: Hypertension.  Comparison Study: No prior studies. Performing Technologist: Carlos Levering RVT  Examination Guidelines: A complete evaluation includes at minimum, Doppler waveform signals and systolic blood pressure  reading at the level of bilateral brachial, anterior tibial, and posterior tibial arteries, when vessel segments are accessible. Bilateral testing is considered an integral part of a complete examination. Photoelectric Plethysmograph (PPG) waveforms and toe systolic pressure readings are included as required and additional duplex testing as needed. Limited examinations for reoccurring indications may be performed as noted.  ABI Findings: +---------+------------------+-----+---------+--------+ Right    Rt Pressure (mmHg)IndexWaveform Comment  +---------+------------------+-----+---------+--------+ Brachial 135                    triphasic         +---------+------------------+-----+---------+--------+ PTA      122               0.90 biphasic          +---------+------------------+-----+---------+--------+ DP       116               0.86 biphasic          +---------+------------------+-----+---------+--------+ Great Toe47                0.35                   +---------+------------------+-----+---------+--------+ +---------+------------------+-----+---------+-------+ Left     Lt Pressure (mmHg)IndexWaveform Comment +---------+------------------+-----+---------+-------+ Brachial 130  triphasic        +---------+------------------+-----+---------+-------+ PTA      162               1.20 biphasic         +---------+------------------+-----+---------+-------+ DP       165               1.22 triphasic        +---------+------------------+-----+---------+-------+ Great Toe59                0.44                  +---------+------------------+-----+---------+-------+ +-------+-----------+-----------+------------+------------+ ABI/TBIToday's ABIToday's TBIPrevious ABIPrevious TBI +-------+-----------+-----------+------------+------------+ Right  0.9        0.35                                 +-------+-----------+-----------+------------+------------+ Left   1.22       0.44                                +-------+-----------+-----------+------------+------------+  Summary: Right: Resting right ankle-brachial index indicates mild right lower extremity arterial disease. The right toe-brachial index is abnormal. Left: Resting left ankle-brachial index is within normal range. No evidence of significant left lower extremity arterial disease. The left toe-brachial index is abnormal.  *See table(s) above for measurements and observations.  Electronically signed by Servando Snare MD on 04/02/2021 at 4:52:40 PM.    Final    [place] EKG SITE RITE  Result Date: 04/01/2021 If Site Rite image not attached, placement could not be confirmed due to current cardiac rhythm.     Subjective: Some nausea with an episode of emesis this morning but feels like she can manage at home.  Discharge Exam: Vitals:   04/04/21 0543 04/04/21 1400  BP: 127/73 (!) 142/81  Pulse: 87 89  Resp: 13 16  Temp: 98 F (36.7 C)   SpO2: 100% 99%   Vitals:   04/03/21 0251 04/03/21 0400 04/04/21 0543 04/04/21 1400  BP: 121/70 109/67 127/73 (!) 142/81  Pulse: 91 87 87 89  Resp: 18 18 13 16   Temp: 98.2 F (36.8 C) 98.6 F (37 C) 98 F (36.7 C)   TempSrc: Oral Oral Oral   SpO2: 97% 99% 100% 99%  Weight:      Height:        General: Pt is alert, awake, not in acute distress Cardiovascular: RRR, S1/S2 +, no rubs, no gallops Respiratory: CTA bilaterally, no wheezing, no rhonchi Abdominal: Soft, NT, ND, bowel sounds + Extremities: no cyanosis    The results of significant diagnostics from this hospitalization (including imaging, microbiology, ancillary and laboratory) are listed below for reference.     Microbiology: Recent Results (from the past 240 hour(s))  Resp Panel by RT-PCR (Flu A&B, Covid) Nasopharyngeal Swab     Status: None   Collection Time: 03/31/21  8:44 PM   Specimen: Nasopharyngeal Swab;  Nasopharyngeal(NP) swabs in vial transport medium  Result Value Ref Range Status   SARS Coronavirus 2 by RT PCR NEGATIVE NEGATIVE Final    Comment: (NOTE) SARS-CoV-2 target nucleic acids are NOT DETECTED.  The SARS-CoV-2 RNA is generally detectable in upper respiratory specimens during the acute phase of infection. The lowest concentration of SARS-CoV-2 viral copies this assay can detect is 138 copies/mL. A negative result does not preclude SARS-Cov-2 infection  and should not be used as the sole basis for treatment or other patient management decisions. A negative result may occur with  improper specimen collection/handling, submission of specimen other than nasopharyngeal swab, presence of viral mutation(s) within the areas targeted by this assay, and inadequate number of viral copies(<138 copies/mL). A negative result must be combined with clinical observations, patient history, and epidemiological information. The expected result is Negative.  Fact Sheet for Patients:  EntrepreneurPulse.com.au  Fact Sheet for Healthcare Providers:  IncredibleEmployment.be  This test is no t yet approved or cleared by the [place] FDA and  has been authorized for detection and/or diagnosis of SARS-CoV-2 by FDA under an Emergency Use Authorization (EUA). This EUA will remain  in effect (meaning this test can be used) for the duration of the COVID-19 declaration under Section 564(b)(1) of the Act, 21 U.S.C.section 360bbb-3(b)(1), unless the authorization is terminated  or revoked sooner.       Influenza A by PCR NEGATIVE NEGATIVE Final   Influenza B by PCR NEGATIVE NEGATIVE Final    Comment: (NOTE) The Xpert Xpress SARS-CoV-2/FLU/RSV plus assay is intended as an aid in the diagnosis of influenza from Nasopharyngeal swab specimens and should not be used as a sole basis for treatment. Nasal washings and aspirates are unacceptable for Xpert Xpress  SARS-CoV-2/FLU/RSV testing.  Fact Sheet for Patients: EntrepreneurPulse.com.au  Fact Sheet for Healthcare Providers: IncredibleEmployment.be  This test is not yet approved or cleared by the [place] FDA and has been authorized for detection and/or diagnosis of SARS-CoV-2 by FDA under an Emergency Use Authorization (EUA). This EUA will remain in effect (meaning this test can be used) for the duration of the COVID-19 declaration under Section 564(b)(1) of the Act, 21 U.S.C. section 360bbb-3(b)(1), unless the authorization is terminated or revoked.  Performed at [facility], [place] [address], [place], [place] [address]   Urine Culture     Status: None   Collection Time: 04/01/21  9:07 AM   Specimen: In/Out Cath Urine  Result Value Ref Range Status   Specimen Description   Final    IN/OUT CATH URINE Performed at [facility] [address] [place], [place] [address]    Special Requests   Final    NONE Performed at [facility], [place] [address], [place], [place] [address]    Culture   Final    NO GROWTH Performed at [facility], [place] [address], [place], [place] [address]    Report Status 04/02/2021 FINAL  Final     Labs: BNP (last 3 results) No results for input(s): BNP in the last 8760 hours. Basic Metabolic Panel: Recent Labs  Lab 03/31/21 1349 03/31/21 1817 04/01/21 1837 04/02/21 1110 04/03/21 1054 04/04/21 0956  NA 137  --  138 139 136 139  K 3.7  --  3.0* 5.8* 4.0 4.7  CL 104  --  104 117* 108 109  CO2 22  --  21* 15* 20* 21*  GLUCOSE 94  --  96 94 81 66*  BUN 12  --  10 9 7* 6*  CREATININE 0.67  --  0.89 0.82 0.60 0.57  CALCIUM 8.2*  --  7.7* 7.9* 7.7* 7.9*  MG 1.4* 1.6* 1.7  --   --   --    Liver Function Tests: Recent Labs  Lab 03/31/21 1349 04/01/21 1837  AST 39 39  ALT 47* 34  ALKPHOS 119 103  BILITOT 1.1 0.8  PROT  6.0* 5.1*  ALBUMIN 2.6*  2.1*   Recent Labs  Lab 03/31/21 1338  LIPASE 20   No results for input(s): AMMONIA in the last 168 hours. CBC: Recent Labs  Lab 03/31/21 1338 04/01/21 0513  WBC 4.0 7.4  NEUTROABS 3.0  --   HGB 13.6 15.9*  HCT 39.5 45.6  MCV 100.5* 99.3  PLT 169 164   Cardiac Enzymes: No results for input(s): CKTOTAL, CKMB, CKMBINDEX, TROPONINI in the last 168 hours. BNP: Invalid input(s): POCBNP CBG: No results for input(s): GLUCAP in the last 168 hours. D-Dimer No results for input(s): DDIMER in the last 72 hours. Hgb A1c No results for input(s): HGBA1C in the last 72 hours. Lipid Profile No results for input(s): CHOL, HDL, LDLCALC, TRIG, CHOLHDL, LDLDIRECT in the last 72 hours. Thyroid function studies No results for input(s): TSH, T4TOTAL, T3FREE, THYROIDAB in the last 72 hours.  Invalid input(s): FREET3 Anemia work up No results for input(s): VITAMINB12, FOLATE, FERRITIN, TIBC, IRON, RETICCTPCT in the last 72 hours. Urinalysis    Component Value Date/Time   COLORURINE YELLOW 04/01/2021 0907   APPEARANCEUR HAZY (A) 04/01/2021 0907   LABSPEC 1.027 04/01/2021 0907   PHURINE 6.0 04/01/2021 0907   GLUCOSEU NEGATIVE 04/01/2021 0907   HGBUR MODERATE (A) 04/01/2021 0907   BILIRUBINUR NEGATIVE 04/01/2021 0907   BILIRUBINUR negative 03/30/2020 1215   KETONESUR 5 (A) 04/01/2021 0907   PROTEINUR NEGATIVE 04/01/2021 0907   UROBILINOGEN 0.2 03/30/2020 1215   NITRITE NEGATIVE 04/01/2021 0907   LEUKOCYTESUR NEGATIVE 04/01/2021 0907   Sepsis Labs Invalid input(s): PROCALCITONIN,  WBC,  LACTICIDVEN Microbiology Recent Results (from the past 240 hour(s))  Resp Panel by RT-PCR (Flu A&B, Covid) Nasopharyngeal Swab     Status: None   Collection Time: 03/31/21  8:44 PM   Specimen: Nasopharyngeal Swab; Nasopharyngeal(NP) swabs in vial transport medium  Result Value Ref Range Status   SARS Coronavirus 2 by RT PCR NEGATIVE NEGATIVE Final    Comment: (NOTE) SARS-CoV-2 target nucleic acids  are NOT DETECTED.  The SARS-CoV-2 RNA is generally detectable in upper respiratory specimens during the acute phase of infection. The lowest concentration of SARS-CoV-2 viral copies this assay can detect is 138 copies/mL. A negative result does not preclude SARS-Cov-2 infection and should not be used as the sole basis for treatment or other patient management decisions. A negative result may occur with  improper specimen collection/handling, submission of specimen other than nasopharyngeal swab, presence of viral mutation(s) within the areas targeted by this assay, and inadequate number of viral copies(<138 copies/mL). A negative result must be combined with clinical observations, patient history, and epidemiological information. The expected result is Negative.  Fact Sheet for Patients:  EntrepreneurPulse.com.au  Fact Sheet for Healthcare Providers:  IncredibleEmployment.be  This test is no t yet approved or cleared by the [place] FDA and  has been authorized for detection and/or diagnosis of SARS-CoV-2 by FDA under an Emergency Use Authorization (EUA). This EUA will remain  in effect (meaning this test can be used) for the duration of the COVID-19 declaration under Section 564(b)(1) of the Act, 21 U.S.C.section 360bbb-3(b)(1), unless the authorization is terminated  or revoked sooner.       Influenza A by PCR NEGATIVE NEGATIVE Final   Influenza B by PCR NEGATIVE NEGATIVE Final    Comment: (NOTE) The Xpert Xpress SARS-CoV-2/FLU/RSV plus assay is intended as an aid in the diagnosis of influenza from Nasopharyngeal swab specimens and should not be used as a sole basis for  treatment. Nasal washings and aspirates are unacceptable for Xpert Xpress SARS-CoV-2/FLU/RSV testing.  Fact Sheet for Patients: EntrepreneurPulse.com.au  Fact Sheet for Healthcare Providers: IncredibleEmployment.be  This test is  not yet approved or cleared by the [place] FDA and has been authorized for detection and/or diagnosis of SARS-CoV-2 by FDA under an Emergency Use Authorization (EUA). This EUA will remain in effect (meaning this test can be used) for the duration of the COVID-19 declaration under Section 564(b)(1) of the Act, 21 U.S.C. section 360bbb-3(b)(1), unless the authorization is terminated or revoked.  Performed at [facility], [place] [address], [place], [place] [address]   Urine Culture     Status: None   Collection Time: 04/01/21  9:07 AM   Specimen: In/Out Cath Urine  Result Value Ref Range Status   Specimen Description   Final    IN/OUT CATH URINE Performed at [facility] [address] [place], [place] [address]    Special Requests   Final    NONE Performed at [facility], [place] [address] [place], [place] [address]    Culture   Final    NO GROWTH Performed at [facility], [place] [address] [place], [place] [address]    Report Status 04/02/2021 FINAL  Final     Time coordinating discharge: 35 minutes  SIGNED:   Cordelia Poche, MD [facility] 04/04/2021, 5:05 PM

## 2021-04-04 NOTE — Telephone Encounter (Signed)
Called pt, lvm. Wanted to check to see how she is doing currently, & to remind about appointment on tomorrow. 04/05/2021. Number & extension left.

## 2021-04-05 ENCOUNTER — Ambulatory Visit: Payer: Medicare HMO

## 2021-04-05 ENCOUNTER — Ambulatory Visit: Payer: Medicare HMO | Admitting: Internal Medicine

## 2021-04-05 ENCOUNTER — Telehealth: Payer: Self-pay

## 2021-04-05 NOTE — Telephone Encounter (Signed)
Transition Care Management Unsuccessful Follow-up Telephone Call  Date of discharge and from where:  04/04/2021 [facility]   Attempts:  1st Attempt  Reason for unsuccessful TCM follow-up call:  Left voice message

## 2021-04-06 LAB — VITAMIN C: Vitamin C: 0.3 mg/dL — ABNORMAL LOW (ref 0.4–2.0)

## 2021-04-09 ENCOUNTER — Telehealth: Payer: Self-pay

## 2021-04-09 NOTE — Telephone Encounter (Signed)
Transition Care Management Follow-up Telephone Call Date of discharge and from where: [facility] 04/04/2021.  How have you been since you were released from the hospital? Ambulation is a little hard for her right now, she does have a catheter as well, can not urinate on her own.  Any questions or concerns? Yes  Items Reviewed: Did the pt receive and understand the discharge instructions provided? Yes  Medications obtained and verified? Yes  Other? Yes  Any new allergies since your discharge? No  Dietary orders reviewed? Yes Do you have support at home? Yes   Home Care and Equipment/Supplies: Were home health services ordered? not applicable If so, what is the name of the agency? N/a   Has the agency set up a time to come to the patient's home? not applicable Were any new equipment or medical supplies ordered?  No What is the name of the medical supply agency? N/a Were you able to get the supplies/equipment? not applicable Do you have any questions related to the use of the equipment or supplies? No  Functional Questionnaire: (I = Independent and D = Dependent) ADLs: I/D      Bathing/Dressing- I/D  Meal Prep- I/D  Eating- I/D  Maintaining continence- I/D  Transferring/Ambulation- I/D  Managing Meds- I/D  Follow up appointments reviewed:  PCP Hospital f/u appt confirmed? Yes  Scheduled to see [facility]  on 04/09/2021 @ [facility]. [facility] f/u appt confirmed? No  Scheduled to see N/A on N/A @ N/A. Are transportation arrangements needed? No  If their condition worsens, is the pt aware to call PCP or go to the Emergency Dept.? Yes Was the patient provided with contact information for the PCP's office or ED? Yes Was to pt encouraged to call back with questions or concerns? Yes

## 2021-04-11 LAB — VITAMIN K1, SERUM: VITAMIN K1: 0.19 ng/mL (ref 0.10–2.20)

## 2021-04-19 ENCOUNTER — Telehealth (INDEPENDENT_AMBULATORY_CARE_PROVIDER_SITE_OTHER): Payer: Medicare HMO | Admitting: Nurse Practitioner

## 2021-04-19 ENCOUNTER — Observation Stay (HOSPITAL_COMMUNITY)
Admission: EM | Admit: 2021-04-19 | Discharge: 2021-04-25 | Disposition: A | Discharge status: Home / Self Care | Payer: Medicare HMO | Attending: Internal Medicine | Admitting: Internal Medicine

## 2021-04-19 ENCOUNTER — Emergency Department (HOSPITAL_COMMUNITY): Payer: Medicare HMO

## 2021-04-19 ENCOUNTER — Encounter (HOSPITAL_COMMUNITY): Payer: Self-pay | Admitting: Emergency Medicine

## 2021-04-19 DIAGNOSIS — R296 Repeated falls: Secondary | ICD-10-CM | POA: Diagnosis not present

## 2021-04-19 DIAGNOSIS — G319 Degenerative disease of nervous system, unspecified: Secondary | ICD-10-CM | POA: Diagnosis not present

## 2021-04-19 DIAGNOSIS — M4319 Spondylolisthesis, multiple sites in spine: Secondary | ICD-10-CM | POA: Diagnosis not present

## 2021-04-19 DIAGNOSIS — M4804 Spinal stenosis, thoracic region: Secondary | ICD-10-CM | POA: Diagnosis not present

## 2021-04-19 DIAGNOSIS — S32030A Wedge compression fracture of third lumbar vertebra, initial encounter for closed fracture: Secondary | ICD-10-CM | POA: Diagnosis not present

## 2021-04-19 DIAGNOSIS — Z79899 Other long term (current) drug therapy: Secondary | ICD-10-CM | POA: Insufficient documentation

## 2021-04-19 DIAGNOSIS — E43 Unspecified severe protein-calorie malnutrition: Secondary | ICD-10-CM

## 2021-04-19 DIAGNOSIS — I739 Peripheral vascular disease, unspecified: Secondary | ICD-10-CM | POA: Insufficient documentation

## 2021-04-19 DIAGNOSIS — R270 Ataxia, unspecified: Secondary | ICD-10-CM | POA: Diagnosis not present

## 2021-04-19 DIAGNOSIS — E876 Hypokalemia: Secondary | ICD-10-CM | POA: Insufficient documentation

## 2021-04-19 DIAGNOSIS — I4581 Long QT syndrome: Secondary | ICD-10-CM | POA: Insufficient documentation

## 2021-04-19 DIAGNOSIS — I10 Essential (primary) hypertension: Secondary | ICD-10-CM | POA: Diagnosis not present

## 2021-04-19 DIAGNOSIS — R531 Weakness: Secondary | ICD-10-CM | POA: Diagnosis not present

## 2021-04-19 DIAGNOSIS — R8271 Bacteriuria: Secondary | ICD-10-CM | POA: Diagnosis present

## 2021-04-19 DIAGNOSIS — Z20822 Contact with and (suspected) exposure to covid-19: Secondary | ICD-10-CM | POA: Diagnosis not present

## 2021-04-19 DIAGNOSIS — Y900 Blood alcohol level of less than 20 mg/100 ml: Secondary | ICD-10-CM | POA: Diagnosis not present

## 2021-04-19 DIAGNOSIS — R1111 Vomiting without nausea: Secondary | ICD-10-CM | POA: Diagnosis not present

## 2021-04-19 DIAGNOSIS — R110 Nausea: Secondary | ICD-10-CM | POA: Diagnosis not present

## 2021-04-19 DIAGNOSIS — F32A Depression, unspecified: Secondary | ICD-10-CM | POA: Diagnosis present

## 2021-04-19 DIAGNOSIS — K2970 Gastritis, unspecified, without bleeding: Secondary | ICD-10-CM | POA: Diagnosis present

## 2021-04-19 DIAGNOSIS — R000 Tachycardia, unspecified: Secondary | ICD-10-CM | POA: Diagnosis not present

## 2021-04-19 DIAGNOSIS — R29818 Other symptoms and signs involving the nervous system: Secondary | ICD-10-CM | POA: Diagnosis not present

## 2021-04-19 DIAGNOSIS — S32040A Wedge compression fracture of fourth lumbar vertebra, initial encounter for closed fracture: Secondary | ICD-10-CM | POA: Diagnosis not present

## 2021-04-19 DIAGNOSIS — R9431 Abnormal electrocardiogram [ECG] [EKG]: Secondary | ICD-10-CM | POA: Diagnosis present

## 2021-04-19 DIAGNOSIS — R112 Nausea with vomiting, unspecified: Secondary | ICD-10-CM

## 2021-04-19 DIAGNOSIS — R29898 Other symptoms and signs involving the musculoskeletal system: Secondary | ICD-10-CM | POA: Diagnosis present

## 2021-04-19 DIAGNOSIS — J90 Pleural effusion, not elsewhere classified: Secondary | ICD-10-CM | POA: Diagnosis not present

## 2021-04-19 LAB — RESP PANEL BY RT-PCR (FLU A&B, COVID) ARPGX2
Influenza A by PCR: NEGATIVE
Influenza B by PCR: NEGATIVE
SARS Coronavirus 2 by RT PCR: NEGATIVE

## 2021-04-19 LAB — RAPID URINE DRUG SCREEN, HOSP PERFORMED
Amphetamines: NOT DETECTED
Barbiturates: NOT DETECTED
Benzodiazepines: NOT DETECTED
Cocaine: NOT DETECTED
Opiates: POSITIVE — AB
Tetrahydrocannabinol: NOT DETECTED

## 2021-04-19 IMAGING — CT CT HEAD W/O CM
3 series · 14 of 47 positions shown, 16 images · non-contrast
Comparison: [DATE]

CLINICAL DATA: Neuro deficit, acute, stroke suspected

EXAM:
CT HEAD WITHOUT CONTRAST
TECHNIQUE: Contiguous axial images were obtained from the base of the skull
through the vertex without intravenous contrast.

[Series 2: head wo · axial · 0.47mm/px · z∈[+1558,+1683]mm · 8 of 30 slices shown, 10 images]
[im 3/30  brain]
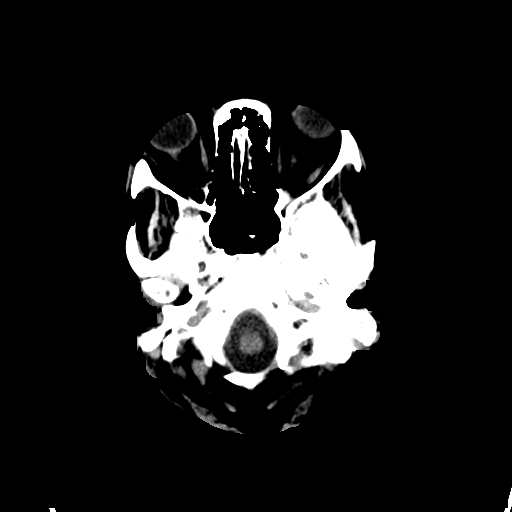
[im 3/30  bone]
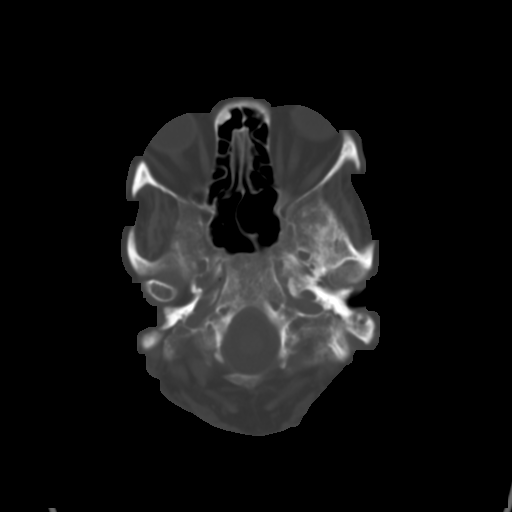
[im 7/30  brain]
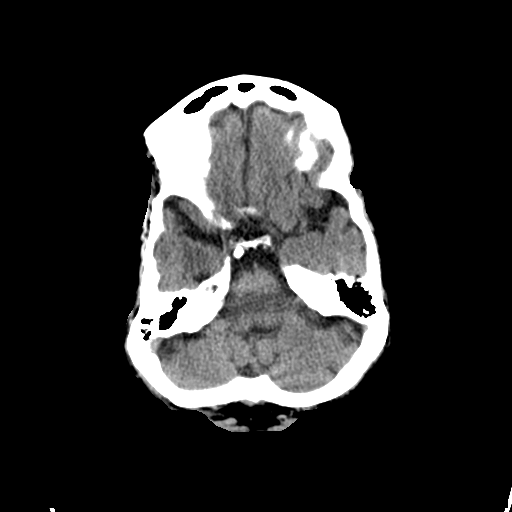
[im 10/30  brain]
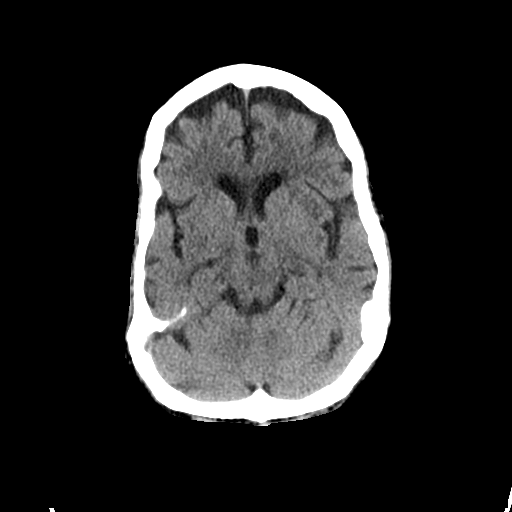
[im 14/30  brain]
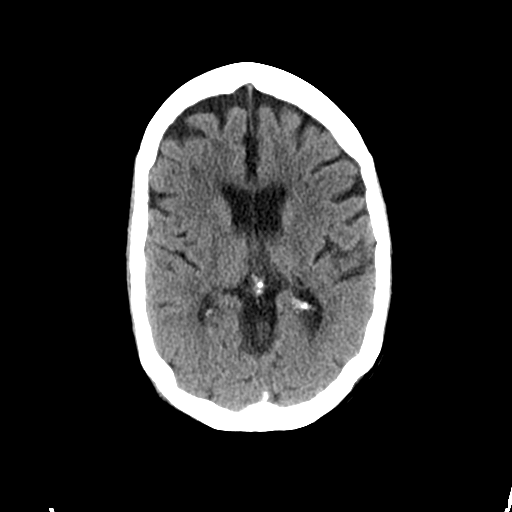
[im 17/30  brain]
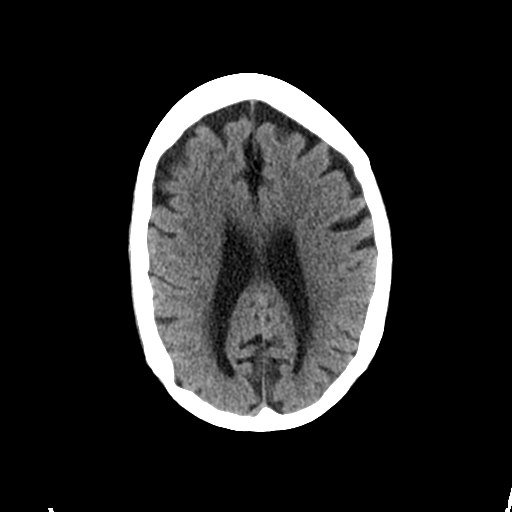
[im 17/30  bone]
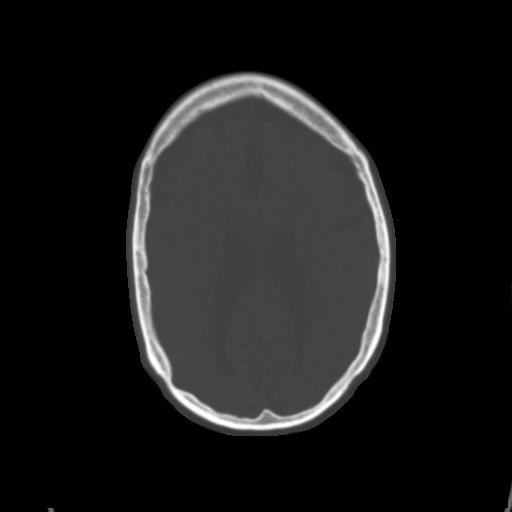
[im 21/30  brain]
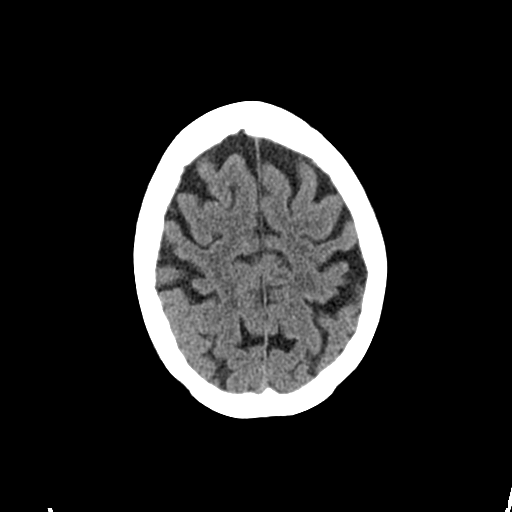
[im 24/30  brain]
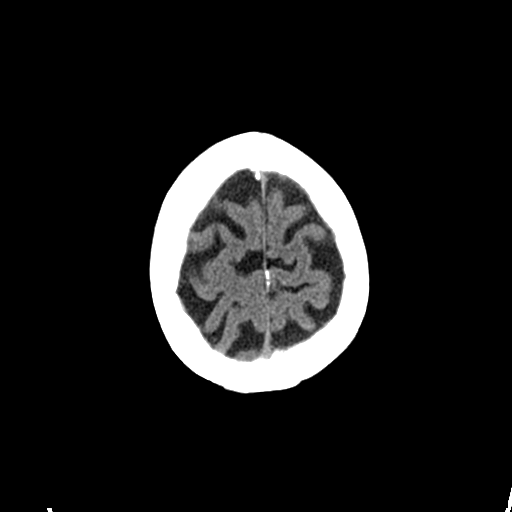
[im 28/30  brain]
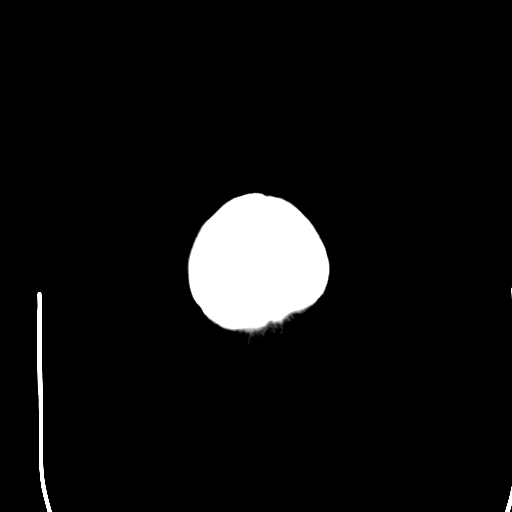

[Series 4: coronal soft tissue · coronal · 0.31mm/px · 3 of 61 slices shown]
[im 21/61  brain]
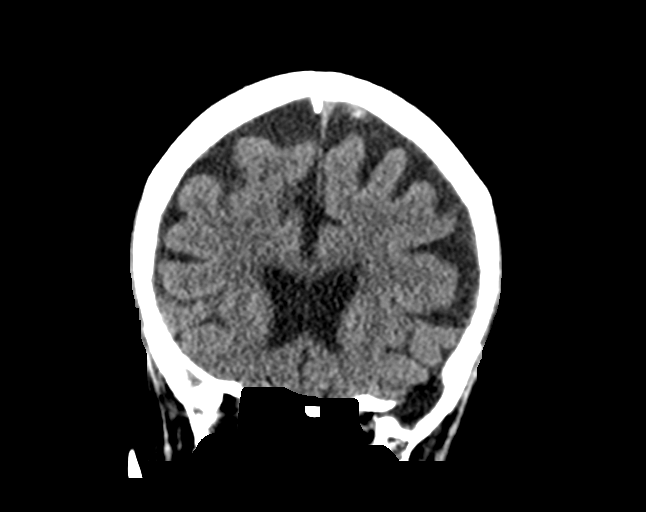
[im 27/61  brain]
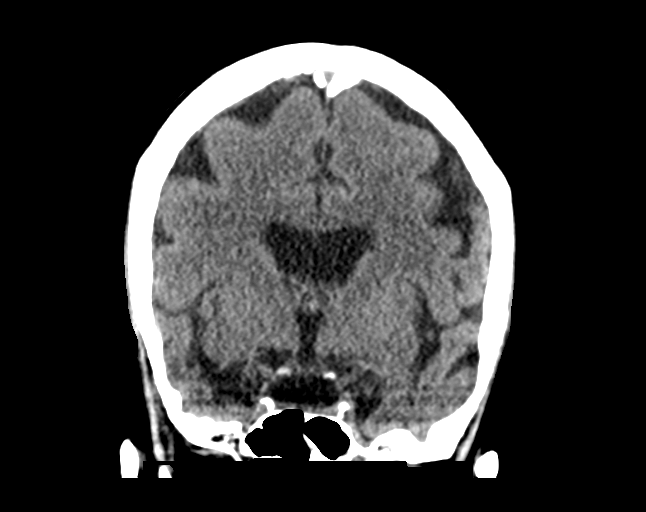
[im 34/61  brain]
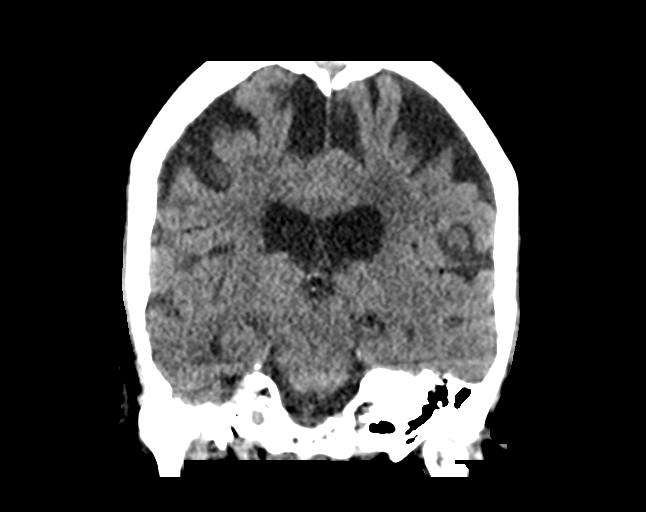

[Series 5: sagittal soft tissue · sagittal · 0.32mm/px · 3 of 45 slices shown]
[im 15/45  brain]
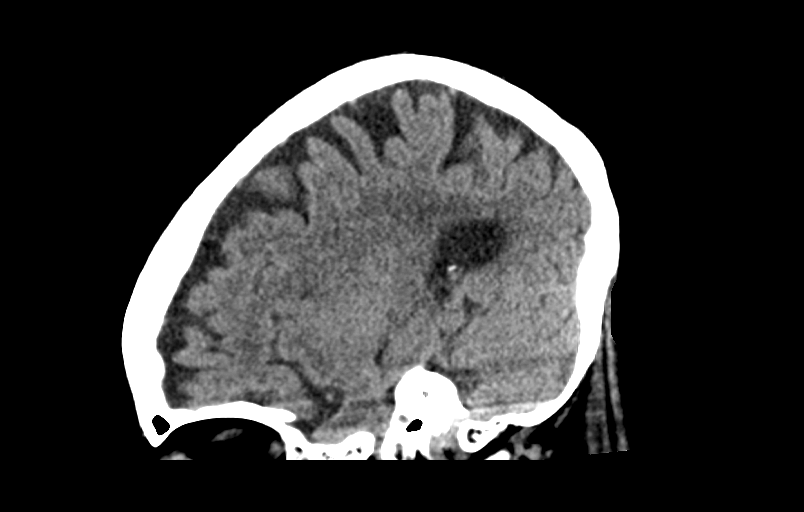
[im 23/45  brain]
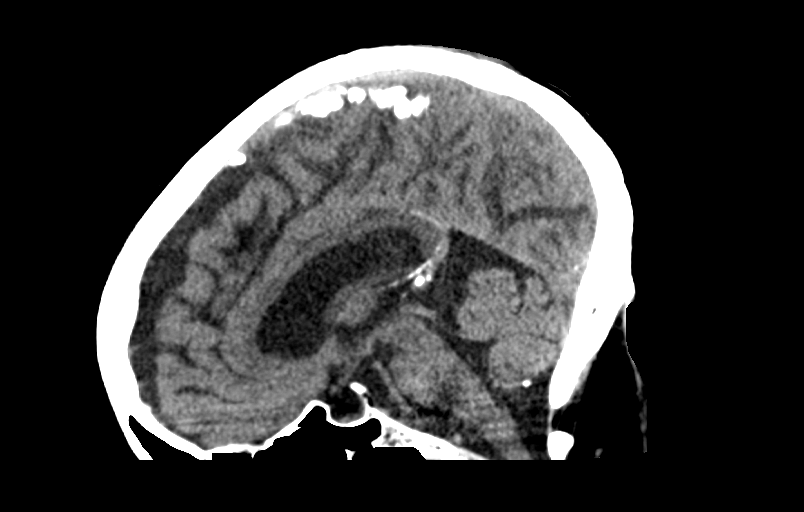
[im 30/45  brain]
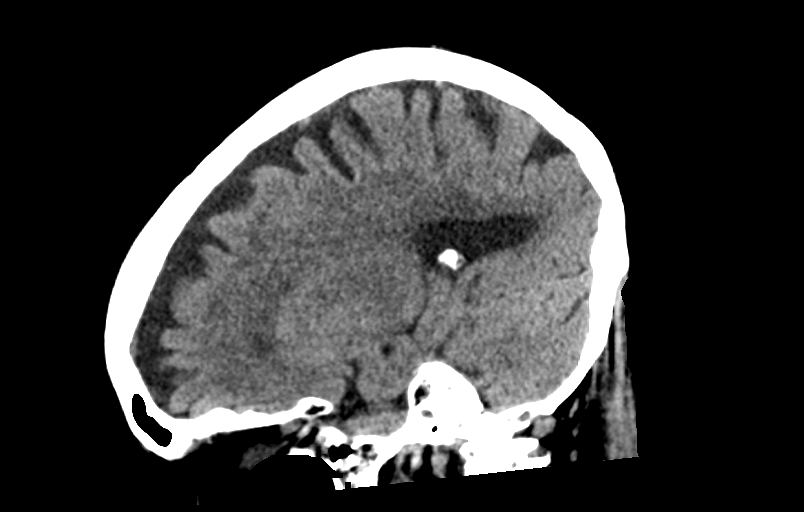

[14 of 47 positions shown; findings below may reference images not displayed]

FINDINGS: Brain: There is atrophy and chronic small vessel disease changes. No
acute intracranial abnormality. Specifically, no hemorrhage,
hydrocephalus, mass lesion, acute infarction, or significant
intracranial injury.

Vascular: No hyperdense vessel or unexpected calcification.

Skull: No acute calvarial abnormality.

Sinuses/Orbits: No acute findings

Other: None
IMPRESSION: Atrophy, chronic microvascular disease.

No acute intracranial abnormality.

## 2021-04-19 NOTE — ED Provider Notes (Signed)
[place] DEPT Provider Note   CSN: [id number] Arrival date & time: 04/19/21  2121     History Chief Complaint  Patient presents with   Weakness   Nausea    Debbie Barry is a 70 y.o. female.  Patient presents to the emergency department with a chief complaint of left lower extremity weakness.  She states that since being discharged from the hospital 2 weeks ago she has not been able to move her left leg normally.  She states that she has had to drag it behind her.  She states that now it has weakened to the point where she cannot bear weight on it.  She reports associated numbness and tingling.  She also states that she has a skin ulcer on the leg.  She denies any other associated symptoms.  Denies any successful treatments prior to arrival.  The history is provided by the patient. No language interpreter was used.      Past Medical History:  Diagnosis Date   Fatty liver    GERD (gastroesophageal reflux disease)    Hypertension    Osteomyelitis ([place])    right third toe   Peripheral vascular disease ([place])    Ulcer 08/14/20    Patient Active Problem List   Diagnosis Date Noted   Protein-calorie malnutrition, severe 04/03/2021   Gastritis 03/31/2021   Nausea and vomiting 03/31/2021   Iron overload 02/13/2021   Neuropathy 02/13/2021   Elevated LFTs 02/13/2021   Weight loss, non-intentional 02/13/2021   Falls frequently 02/13/2021   GI bleed 08/13/2020   UGIB (upper gastrointestinal bleed) 08/12/2020   Depression 08/12/2020   Hypokalemia 08/12/2020   Essential hypertension, benign 03/23/2019    Past Surgical History:  Procedure Laterality Date   AMPUTATION Right 08/08/2017   Procedure: AMPUTATION RIGHT 3RD TOE;  Surgeon: Newt Minion, MD;  Location: [place];  Service: Orthopedics;  Laterality: Right;   BIOPSY  08/14/2020   Procedure: BIOPSY;  Surgeon: Clarene Essex, MD;  Location: [facility];  Service: Endoscopy;;   Bunionectomy  Right 2006     Right   Bunionectony Left 2006      August   COLONOSCOPY N/A 01/21/2014   Procedure: COLONOSCOPY;  Surgeon: Beryle Beams, MD;  Location: [facility];  Service: Endoscopy;  Laterality: N/A;   ESOPHAGOGASTRODUODENOSCOPY (EGD) WITH PROPOFOL N/A 08/14/2020   Procedure: ESOPHAGOGASTRODUODENOSCOPY (EGD) WITH PROPOFOL;  Surgeon: Clarene Essex, MD;  Location: [facility];  Service: Endoscopy;  Laterality: N/A;   TUBAL LIGATION     WISDOM TOOTH EXTRACTION       OB History   No obstetric history on file.     Family History  Problem Relation Age of Onset   Breast cancer Mother    Colon cancer Mother    Heart Problems Mother    Hypertension Mother    Multiple myeloma Mother    Arthritis Mother    Cancer Mother    Heart disease Mother    Hypertension Father    Heart disease Father    Multiple myeloma Father    Arthritis Father    Cancer Father     Social History   Tobacco Use   Smoking status: Never   Smokeless tobacco: Never  Vaping Use   Vaping Use: Never used  Substance Use Topics   Alcohol use: Yes    Alcohol/week: 5.0 standard drinks    Types: 5 Glasses of wine per week    Comment: social glass of wine  Drug use: No    Home Medications Prior to Admission medications   Medication Sig Start Date End Date Taking? Authorizing Provider  Caffeine-Magnesium Salicylate (DIUREX PO) Take 2 tablets by mouth in the morning and at bedtime.    [provider]  Cholecalciferol (VITAMIN D3) 5000 units CAPS Take 5,000 Units by mouth daily.    [provider]  feeding supplement (ENSURE ENLIVE / ENSURE PLUS) LIQD Take 237 mLs by mouth 3 (three) times daily between meals. 04/04/21   Mariel Aloe, MD  hydrOXYzine (ATARAX/VISTARIL) 50 MG tablet Take 1 tablet (50 mg total) by mouth 3 (three) times daily. 04/04/21 05/04/21  Mariel Aloe, MD  Multiple Vitamin (MULTIVITAMIN WITH MINERALS) TABS tablet Take 1 tablet by mouth daily.    [provider]   ondansetron (ZOFRAN ODT) 4 MG disintegrating tablet Take 1 tablet (4 mg total) by mouth every 8 (eight) hours as needed for nausea or vomiting. 04/04/21   Mariel Aloe, MD  pantoprazole (PROTONIX) 40 MG tablet Take 1 tablet (40 mg total) by mouth daily. 04/04/21   Mariel Aloe, MD  potassium chloride SA (KLOR-CON) 20 MEQ tablet Take 1 tablet (20 mEq total) by mouth 2 (two) times daily. Patient taking differently: Take 20 mEq by mouth daily. 02/14/21   Magrinat, Virgie Dad, MD  sertraline (ZOLOFT) 25 MG tablet Take 1 tablet (25 mg total) by mouth daily. 02/01/21   Bary Castilla, NP  Tetrahydrozoline HCl (VISINE OP) Place 2 drops into both eyes daily as needed (for dry/irritated eyes).     [provider]  triamcinolone ointment (KENALOG) 0.5 % Apply 1 application topically 2 (two) times daily. Patient taking differently: Apply 1 application topically 2 (two) times daily as needed (rash/itching). 03/28/21   Glendale Chard, MD    Allergies    Patient has no known allergies.  Review of Systems   Review of Systems  All other systems reviewed and are negative.  Physical Exam Updated Vital Signs BP (!) 142/83 (BP Location: Right Arm)   Pulse (!) 105   Temp 98.8 F (37.1 C) (Oral)   Resp 16   Ht $R'5\' 1"'wS$  (1.549 m)   Wt 53.1 kg   SpO2 95%   BMI 22.11 kg/m   Physical Exam Vitals and nursing note reviewed.  Constitutional:      General: She is not in acute distress.    Appearance: She is well-developed.  HENT:     Head: Normocephalic and atraumatic.  Eyes:     Conjunctiva/sclera: Conjunctivae normal.  Cardiovascular:     Rate and Rhythm: Normal rate and regular rhythm.     Heart sounds: No murmur heard. Pulmonary:     Effort: Pulmonary effort is normal. No respiratory distress.     Breath sounds: Normal breath sounds.  Abdominal:     Palpations: Abdomen is soft.     Tenderness: There is no abdominal tenderness.  Musculoskeletal:        General: Normal range of motion.      Cervical back: Neck supple.  Skin:    General: Skin is warm and dry.     Comments: Small skin ulcer to left lateral lower extremity, CDI, no sign of infection  Neurological:     Mental Status: She is alert and oriented to person, place, and time.     Comments: Unable to lift left or right legs off of the bed, but seems to be more weak in the left leg  Psychiatric:  Mood and Affect: Mood normal.        Behavior: Behavior normal.    ED Results / Procedures / Treatments   Labs (all labs ordered are listed, but only abnormal results are displayed) Labs Reviewed  RAPID URINE DRUG SCREEN, HOSP PERFORMED - Abnormal; Notable for the following components:      Result Value   Opiates POSITIVE (*)    All other components within normal limits  URINALYSIS, ROUTINE W REFLEX MICROSCOPIC - Abnormal; Notable for the following components:   Color, Urine AMBER (*)    APPearance TURBID (*)    Glucose, UA 100 (*)    Hgb urine dipstick LARGE (*)    Bilirubin Urine MODERATE (*)    Protein, ur 30 (*)    Nitrite POSITIVE (*)    Leukocytes,Ua MODERATE (*)    All other components within normal limits  URINALYSIS, MICROSCOPIC (REFLEX) - Abnormal; Notable for the following components:   Bacteria, UA MANY (*)    All other components within normal limits  COMPREHENSIVE METABOLIC PANEL - Abnormal; Notable for the following components:   Calcium 7.7 (*)    Total Protein 5.3 (*)    Albumin 1.8 (*)    AST 44 (*)    Alkaline Phosphatase 167 (*)    Total Bilirubin 1.3 (*)    All other components within normal limits  I-STAT CHEM 8, ED - Abnormal; Notable for the following components:   Potassium 3.1 (*)    Calcium, Ion 0.97 (*)    Hemoglobin 15.3 (*)    All other components within normal limits  RESP PANEL BY RT-PCR (FLU A&B, COVID) ARPGX2  ETHANOL  CBC  DIFFERENTIAL  PROTIME-INR  APTT    EKG None  Radiology No results found.  Procedures Procedures   Medications Ordered in  ED Medications - No data to display  ED Course  I have reviewed the triage vital signs and the nursing notes.  Pertinent labs & imaging results that were available during my care of the patient were reviewed by me and considered in my medical decision making (see chart for details).    MDM Rules/Calculators/A&P                           Patient here with generalized weakness, seems to be more left lower extremity weakness.  Will check CT and labs.  CT head is negative.  Laboratory work-up was mostly reassuring.  Urinalysis does reveal concern for UTI.  In chart review, noticed that patient has history of lumbar compression fractures.  She also was diagnosed with acute urinary retention during her last hospitalization.  These 2 findings in association with her left lower extremity weakness, make me concerned about some form of cord compression.  I discussed this case with Dr. Theda Sers, from neurology, who recommends T and L-spine MRI along with brain MRI.  Dr. Theda Sers agrees the patient does not need to be emergently transferred over to [facility], but can get MRIs in the morning here at [facility].  I discussed this plan with the patient, who is agreeable.  Patient signed out to oncoming team, who will continue care. Final Clinical Impression(s) / ED Diagnoses Final diagnoses:  None    Rx / DC Orders ED Discharge Orders     None        Montine Circle, PA-C 04/20/21 2202    Malvin Johns, MD 04/21/21 1527

## 2021-04-19 NOTE — ED Triage Notes (Signed)
Pt reports she is unable to move both of her legs since being discharged on 10/5. Also reports n/v. Denies pain.

## 2021-04-19 NOTE — Patient Instructions (Signed)
Ferritin Test Why am I having this test? The ferritin test is performed to determine if you have anemia due to iron deficiency. The test provides an indication of how much iron is stored in your body. What is being tested? This test measures the level of ferritin in your blood. Ferritin helps your body make red blood cells and the protein hemoglobin. Over time, a low ferritin level will result in a low hemoglobin and red blood cell count. This can lead to symptoms of iron deficiency, such as shortness of breath. What kind of sample is taken?  A blood sample is required for this test. It is usually collected by inserting a needle into a blood vessel. How are the results reported? Your test results will be reported as values. Your health care provider will compare your results to normal ranges that were established after testing a large group of people (reference ranges). Reference ranges may vary among labs and hospitals. For this test, common reference ranges are: Males: 12-300 ng/mL or 12-300 mcg/L (SI units). Females: 10-150 ng/mL or 10-150 mcg/L (SI units). Children or adolescents: Newborn: 25-200 ng/mL. Less than or equal to 1 month old: 200-600 ng/mL. 2-5 months old: 50-200 ng/mL. 6 months to 68 years old: 7-142 ng/mL. What do the results mean? Results that are above the reference range may indicate: Anemia due to causes other than iron deficiency. These include alcoholism. Inflammatory diseases. Examples include collagen vascular disease and chronic hepatitis. Certain types of cancer. These include leukemia. Disorders that cause iron overload in the body, such as hemochromatosis or hemosiderosis. Results that are below the reference range may indicate iron deficiency anemia. Talk with your health care provider about what your results mean. Questions to ask your health care provider Ask your health care provider, or the department that is doing the test: When will my results be  ready? How will I get my results? What are my treatment options? What other tests do I need? What are my next steps? Summary The ferritin test is performed to determine if you have anemia due to iron deficiency. The test provides an indication of how much iron is stored in your body. Ferritin helps your body make red blood cells and the protein hemoglobin. Levels of ferritin that are above or below the reference range may indicate some diseases, such as anemia, leukemia, hepatitis, or hemochromatosis. Talk with your health care provider about what your results mean. This information is not intended to replace advice given to you by your health care provider. Make sure you discuss any questions you have with your health care provider. Document Revised: 10/28/2019 Document Reviewed: 10/28/2019 Elsevier Patient Education  2022 Elsevier Inc.  

## 2021-04-19 NOTE — Progress Notes (Signed)
Virtual Visit via Phone visit due to failed video visit.    This visit type was conducted due to national recommendations for restrictions regarding the COVID-19 Pandemic (e.g. social distancing) in an effort to limit this patient's exposure and mitigate transmission in our community.  Due to her co-morbid illnesses, this patient is at least at moderate risk for complications without adequate follow up.  This format is felt to be most appropriate for this patient at this time.  All issues noted in this document were discussed and addressed.  A limited physical exam was performed with this format.    This visit type was conducted due to national recommendations for restrictions regarding the COVID-19 Pandemic (e.g. social distancing) in an effort to limit this patient's exposure and mitigate transmission in our community.  Patients identity confirmed using two different identifiers.  This format is felt to be most appropriate for this patient at this time.  All issues noted in this document were discussed and addressed.  No physical exam was performed (except for noted visual exam findings with Video Visits).    Date:  04/19/2021   ID:  Shari, Natt [birth year], MRN [id number]  Patient Location:  Home   Provider location:   Office  Chief Complaint:  "Cant get out of bed, feeling very weak."   History of Present Illness:    Debbie Barry is a 70 y.o. female who presents via video conferencing for a telehealth visit today.    The patient does not have symptoms concerning for COVID-19 infection (fever, chills, cough, or new shortness of breath).   Patient is here on a phone call due to failed video call for a hospital follow up.  She got out on Oct 5th from the hospital and went home and currently states that she cant walk. She has a catheter and has an appt with urologist coming up. She has not been able to get out of the bed ever since she got out of the hospital. She is  unable to walk or sit up straight. She has gotten very weak. Denies chest pain, numbness or shortness of breath.     Past Medical History:  Diagnosis Date   Fatty liver    GERD (gastroesophageal reflux disease)    Hypertension    Osteomyelitis ([place])    right third toe   Peripheral vascular disease ([place])    Ulcer 08/14/20   Past Surgical History:  Procedure Laterality Date   AMPUTATION Right 08/08/2017   Procedure: AMPUTATION RIGHT 3RD TOE;  Surgeon: Newt Minion, MD;  Location: [place];  Service: Orthopedics;  Laterality: Right;   BIOPSY  08/14/2020   Procedure: BIOPSY;  Surgeon: Clarene Essex, MD;  Location: [facility];  Service: Endoscopy;;   Bunionectomy Right 2006     Right   Bunionectony Left 2006      August   COLONOSCOPY N/A 01/21/2014   Procedure: COLONOSCOPY;  Surgeon: Beryle Beams, MD;  Location: [facility];  Service: Endoscopy;  Laterality: N/A;   ESOPHAGOGASTRODUODENOSCOPY (EGD) WITH PROPOFOL N/A 08/14/2020   Procedure: ESOPHAGOGASTRODUODENOSCOPY (EGD) WITH PROPOFOL;  Surgeon: Clarene Essex, MD;  Location: [facility];  Service: Endoscopy;  Laterality: N/A;   TUBAL LIGATION     WISDOM TOOTH EXTRACTION       No outpatient medications have been marked as taking for the 04/19/21 encounter (Video Visit) with Bary Castilla, NP.     Allergies:   Patient has no known allergies.   Social History  Tobacco Use   Smoking status: Never   Smokeless tobacco: Never  Vaping Use   Vaping Use: Never used  Substance Use Topics   Alcohol use: Yes    Alcohol/week: 5.0 standard drinks    Types: 5 Glasses of wine per week    Comment: social glass of wine   Drug use: No     Family Hx: The patient's family history includes Arthritis in her father and mother; Breast cancer in her mother; Cancer in her father and mother; Colon cancer in her mother; Heart Problems in her mother; Heart disease in her father and mother; Hypertension in her father and mother; Multiple myeloma in  her father and mother.  ROS:   Please see the history of present illness.    Review of Systems  Constitutional:  Positive for malaise/fatigue and weight loss. Negative for chills.  Respiratory:  Negative for cough, shortness of breath and wheezing.   Cardiovascular:  Negative for chest pain.  Gastrointestinal:  Positive for nausea and vomiting. Negative for constipation and diarrhea.  Musculoskeletal:  Positive for myalgias.       Patient states she is unable to lift her legs   Neurological:  Positive for weakness. Negative for headaches.   All other systems reviewed and are negative.   Labs/Other Tests and Data Reviewed:    Recent Labs: 03/31/2021: TSH 3.190 04/01/2021: ALT 34; Hemoglobin 15.9; Magnesium 1.7; Platelets 164 04/04/2021: BUN 6; Creatinine, Ser 0.57; Potassium 4.7; Sodium 139   Recent Lipid Panel Lab Results  Component Value Date/Time   CHOL 146 08/22/2020 04:04 PM   TRIG 83 08/22/2020 04:04 PM   HDL 53 08/22/2020 04:04 PM   CHOLHDL 2.8 08/22/2020 04:04 PM   LDLCALC 77 08/22/2020 04:04 PM    Wt Readings from Last 3 Encounters:  03/31/21 128 lb 1.4 oz (58.1 kg)  02/13/21 123 lb 1.6 oz (55.8 kg)  08/22/20 141 lb 12.8 oz (64.3 kg)     Exam:    Vital Signs:  There were no vitals taken for this visit.    Physical Exam  ASSESSMENT & PLAN:    1. Generalized weakness -Due to this being a phone call visit, I have advised the patient to call 911 or go to the hospital. I am unable to assess her in the office since this is a phone call visit.   2. Nausea and vomiting, unspecified vomiting type -Advised the pt.to call 911 or go to the hospital.   Follow up: if symptoms persist or do not get better.   The patient was encouraged to call or send a message through [place] for any questions or concerns.   Side effects and appropriate use of all the medication(s) were discussed with the patient today. Patient advised to use the medication(s) as directed by their  healthcare provider. The patient was encouraged to read, review, and understand all associated package inserts and contact our office with any questions or concerns. The patient accepts the risks of the treatment plan and had an opportunity to ask questions.   Staying healthy and adopting a healthy lifestyle for your overall health is important. You should eat 7 or more servings of fruits and vegetables per day. You should drink plenty of water to keep yourself hydrated and your kidneys healthy. This includes about 65-80+ fluid ounces of water. Limit your intake of animal fats especially for elevated cholesterol. Avoid highly processed food and limit your salt intake if you have hypertension. Avoid foods high in saturated/Trans  fats. Along with a healthy diet it is also very important to maintain time for yourself to maintain a healthy mental health with low stress levels. You should get atleast 150 min of moderate intensity exercise weekly for a healthy heart. Along with eating right and exercising, aim for at least 7-9 hours of sleep daily.  Eat more whole grains which includes barley, wheat berries, oats, brown rice and whole wheat pasta. Use healthy plant oils which include olive, soy, corn, sunflower and peanut. Limit your caffeine and sugary drinks. Limit your intake of fast foods. Limit milk and dairy products to one or two daily servings.   Patient was given opportunity to ask questions. Patient verbalized understanding of the plan and was able to repeat key elements of the plan. All questions were answered to their satisfaction.  Raman Cheryllynn Sarff, DNP   I, Raman Addasyn Mcbreen have reviewed all documentation for this visit. The documentation on 04/22/21 for the exam, diagnosis, procedures, and orders are all accurate and complete.      COVID-19 Education: The signs and symptoms of COVID-19 were discussed with the patient and how to seek care for testing (follow up with PCP or arrange E-visit).  The  importance of social distancing was discussed today.  Patient Risk:   After full review of this patients clinical status, I feel that they are at least moderate risk at this time.  Time:   Today, I have spent 15 minutes/ seconds with the patient with telehealth technology discussing above diagnoses.     Medication Adjustments/Labs and Tests Ordered: Current medicines are reviewed at length with the patient today.  Concerns regarding medicines are outlined above.   Tests Ordered: No orders of the defined types were placed in this encounter.   Medication Changes: No orders of the defined types were placed in this encounter.   Disposition:  Follow up prn  Signed, Bary Castilla, NP

## 2021-04-20 ENCOUNTER — Encounter (HOSPITAL_COMMUNITY): Payer: Self-pay

## 2021-04-20 ENCOUNTER — Emergency Department (HOSPITAL_COMMUNITY): Payer: Medicare HMO

## 2021-04-20 ENCOUNTER — Other Ambulatory Visit: Payer: Self-pay

## 2021-04-20 DIAGNOSIS — R296 Repeated falls: Secondary | ICD-10-CM | POA: Diagnosis not present

## 2021-04-20 DIAGNOSIS — R29898 Other symptoms and signs involving the musculoskeletal system: Secondary | ICD-10-CM

## 2021-04-20 DIAGNOSIS — S32040A Wedge compression fracture of fourth lumbar vertebra, initial encounter for closed fracture: Secondary | ICD-10-CM | POA: Diagnosis not present

## 2021-04-20 DIAGNOSIS — R270 Ataxia, unspecified: Secondary | ICD-10-CM | POA: Diagnosis not present

## 2021-04-20 DIAGNOSIS — M4319 Spondylolisthesis, multiple sites in spine: Secondary | ICD-10-CM | POA: Diagnosis not present

## 2021-04-20 DIAGNOSIS — I4581 Long QT syndrome: Secondary | ICD-10-CM | POA: Diagnosis not present

## 2021-04-20 DIAGNOSIS — M4804 Spinal stenosis, thoracic region: Secondary | ICD-10-CM | POA: Diagnosis not present

## 2021-04-20 DIAGNOSIS — E876 Hypokalemia: Secondary | ICD-10-CM | POA: Diagnosis not present

## 2021-04-20 DIAGNOSIS — I739 Peripheral vascular disease, unspecified: Secondary | ICD-10-CM | POA: Diagnosis not present

## 2021-04-20 DIAGNOSIS — Z79899 Other long term (current) drug therapy: Secondary | ICD-10-CM | POA: Diagnosis not present

## 2021-04-20 DIAGNOSIS — Y900 Blood alcohol level of less than 20 mg/100 ml: Secondary | ICD-10-CM | POA: Diagnosis not present

## 2021-04-20 DIAGNOSIS — E43 Unspecified severe protein-calorie malnutrition: Secondary | ICD-10-CM | POA: Diagnosis not present

## 2021-04-20 DIAGNOSIS — G319 Degenerative disease of nervous system, unspecified: Secondary | ICD-10-CM | POA: Diagnosis not present

## 2021-04-20 DIAGNOSIS — S32030A Wedge compression fracture of third lumbar vertebra, initial encounter for closed fracture: Secondary | ICD-10-CM | POA: Diagnosis not present

## 2021-04-20 DIAGNOSIS — I10 Essential (primary) hypertension: Secondary | ICD-10-CM | POA: Diagnosis not present

## 2021-04-20 DIAGNOSIS — J90 Pleural effusion, not elsewhere classified: Secondary | ICD-10-CM | POA: Diagnosis not present

## 2021-04-20 DIAGNOSIS — R9431 Abnormal electrocardiogram [ECG] [EKG]: Secondary | ICD-10-CM | POA: Diagnosis present

## 2021-04-20 DIAGNOSIS — R531 Weakness: Secondary | ICD-10-CM | POA: Diagnosis not present

## 2021-04-20 DIAGNOSIS — R8271 Bacteriuria: Secondary | ICD-10-CM | POA: Diagnosis present

## 2021-04-20 DIAGNOSIS — R000 Tachycardia, unspecified: Secondary | ICD-10-CM | POA: Diagnosis not present

## 2021-04-20 DIAGNOSIS — Z20822 Contact with and (suspected) exposure to covid-19: Secondary | ICD-10-CM | POA: Diagnosis not present

## 2021-04-20 LAB — URINALYSIS, ROUTINE W REFLEX MICROSCOPIC
Glucose, UA: 100 mg/dL — AB
Ketones, ur: NEGATIVE mg/dL
Nitrite: POSITIVE — AB
Protein, ur: 30 mg/dL — AB
Specific Gravity, Urine: 1.025 (ref 1.005–1.030)
pH: 6 (ref 5.0–8.0)

## 2021-04-20 LAB — CBC
HCT: 38.2 % (ref 36.0–46.0)
Hemoglobin: 13.6 g/dL (ref 12.0–15.0)
MCH: 33.9 pg (ref 26.0–34.0)
MCHC: 35.6 g/dL (ref 30.0–36.0)
MCV: 95.3 fL (ref 80.0–100.0)
Platelets: 260 10*3/uL (ref 150–400)
RBC: 4.01 MIL/uL (ref 3.87–5.11)
RDW: 15.4 % (ref 11.5–15.5)
WBC: 7.2 10*3/uL (ref 4.0–10.5)
nRBC: 0 % (ref 0.0–0.2)

## 2021-04-20 LAB — DIFFERENTIAL
Abs Immature Granulocytes: 0.03 10*3/uL (ref 0.00–0.07)
Basophils Absolute: 0.1 10*3/uL (ref 0.0–0.1)
Basophils Relative: 1 %
Eosinophils Absolute: 0.2 10*3/uL (ref 0.0–0.5)
Eosinophils Relative: 2 %
Immature Granulocytes: 0 %
Lymphocytes Relative: 13 %
Lymphs Abs: 1 10*3/uL (ref 0.7–4.0)
Monocytes Absolute: 0.6 10*3/uL (ref 0.1–1.0)
Monocytes Relative: 8 %
Neutro Abs: 5.4 10*3/uL (ref 1.7–7.7)
Neutrophils Relative %: 76 %

## 2021-04-20 LAB — I-STAT CHEM 8, ED
BUN: 9 mg/dL (ref 8–23)
Calcium, Ion: 0.97 mmol/L — ABNORMAL LOW (ref 1.15–1.40)
Chloride: 101 mmol/L (ref 98–111)
Creatinine, Ser: 0.5 mg/dL (ref 0.44–1.00)
Glucose, Bld: 76 mg/dL (ref 70–99)
HCT: 45 % (ref 36.0–46.0)
Hemoglobin: 15.3 g/dL — ABNORMAL HIGH (ref 12.0–15.0)
Potassium: 3.1 mmol/L — ABNORMAL LOW (ref 3.5–5.1)
Sodium: 138 mmol/L (ref 135–145)
TCO2: 27 mmol/L (ref 22–32)

## 2021-04-20 LAB — COMPREHENSIVE METABOLIC PANEL
ALT: 26 U/L (ref 0–44)
AST: 44 U/L — ABNORMAL HIGH (ref 15–41)
Albumin: 1.8 g/dL — ABNORMAL LOW (ref 3.5–5.0)
Alkaline Phosphatase: 167 U/L — ABNORMAL HIGH (ref 38–126)
Anion gap: 11 (ref 5–15)
BUN: 10 mg/dL (ref 8–23)
CO2: 25 mmol/L (ref 22–32)
Calcium: 7.7 mg/dL — ABNORMAL LOW (ref 8.9–10.3)
Chloride: 103 mmol/L (ref 98–111)
Creatinine, Ser: 0.71 mg/dL (ref 0.44–1.00)
GFR, Estimated: >60 mL/min (ref >60)
Glucose, Bld: 79 mg/dL (ref 70–99)
Potassium: 3.5 mmol/L (ref 3.5–5.1)
Sodium: 139 mmol/L (ref 135–145)
Total Bilirubin: 1.3 mg/dL — ABNORMAL HIGH (ref 0.3–1.2)
Total Protein: 5.3 g/dL — ABNORMAL LOW (ref 6.5–8.1)

## 2021-04-20 LAB — APTT: aPTT: 29 seconds (ref 24–36)

## 2021-04-20 LAB — URINALYSIS, MICROSCOPIC (REFLEX): WBC, UA: >50 WBC/hpf (ref 0–5)

## 2021-04-20 LAB — ETHANOL: Alcohol, Ethyl (B): <10 mg/dL (ref <10)

## 2021-04-20 LAB — PROTIME-INR
INR: 1 (ref 0.8–1.2)
Prothrombin Time: 13.6 seconds (ref 11.4–15.2)

## 2021-04-20 IMAGING — MR MR HEAD W/O CM
10 series · 48 of 48 positions shown · non-contrast
Comparison: CT head[DATE].

CLINICAL DATA: Neuro deficit, acute, stroke suspected

EXAM:
MRI HEAD WITHOUT CONTRAST
TECHNIQUE: Multiplanar, multiecho pulse sequences of the brain and surrounding
structures were obtained without intravenous contrast.

[Series 9: DWI · axial · 3.0mm · 1.36mm/px · z∈[-31,+117]mm · 10 of 103 slices shown (1 of 2)]
[im 1/103]
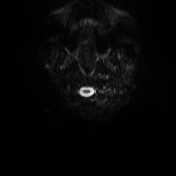
[im 12/103]
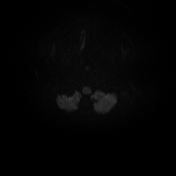
[im 23/103]
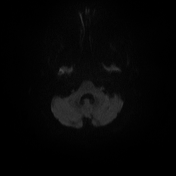
[im 35/103]
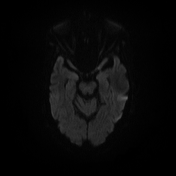
[im 46/103]
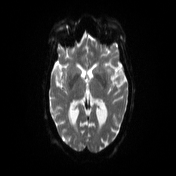
[im 57/103]
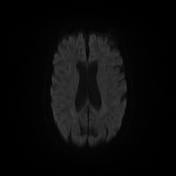
[im 69/103]
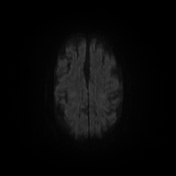
[im 80/103]
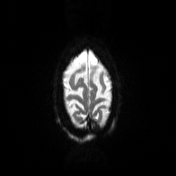
[im 91/103]
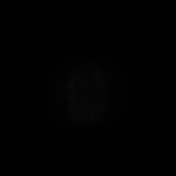
[im 103/103]
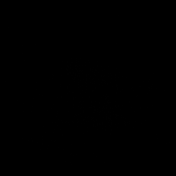

[Series 10: DWI · axial · 3.0mm · 1.36mm/px · z∈[-31,+106]mm · 4 of 48 slices shown (2 of 2)]
[im 1/48]
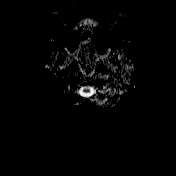
[im 16/48]
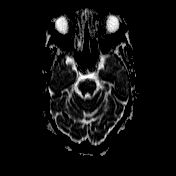
[im 32/48]
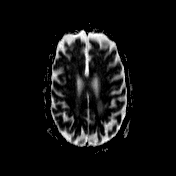
[im 48/48]
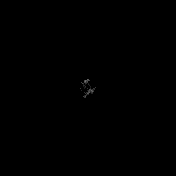

[Series 11: T1 · sagittal · 5.0mm · 0.75mm/px · 2 of 22 slices shown (1 of 2)]
[im 1/22]
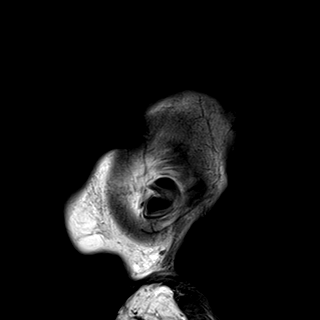
[im 22/22]
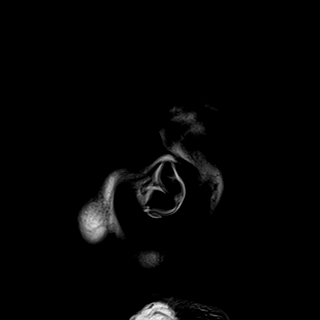

[Series 12: T2 · axial · 5.0mm · 0.62mm/px · z∈[-42,+116]mm · 2 of 26 slices shown (1 of 2)]
[im 1/26]
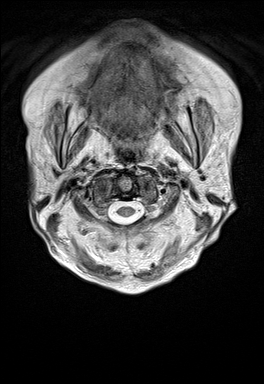
[im 26/26]
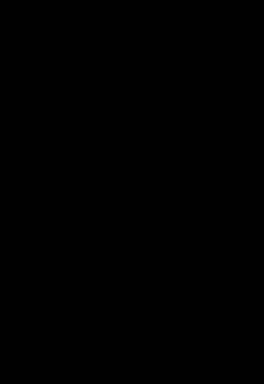

[Series 13: swi_images · axial · 3.0mm · 0.75mm/px · z∈[-37,+111]mm · 4 of 52 slices shown]
[im 1/52]
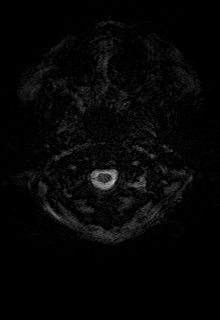
[im 18/52]
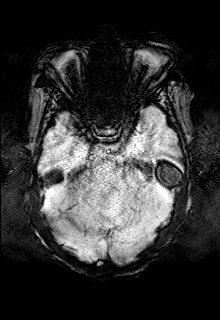
[im 35/52]
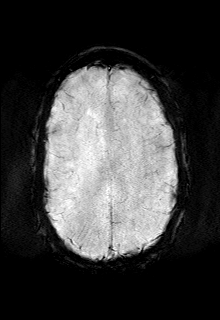
[im 52/52]
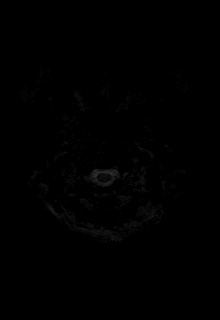

[Series 15: FLAIR · axial · 3.0mm · 0.75mm/px · z∈[-37,+111]mm · 4 of 52 slices shown]
[im 1/52]
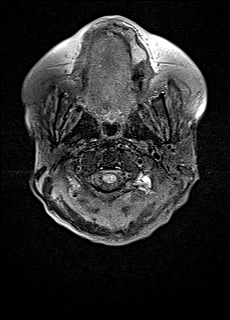
[im 18/52]
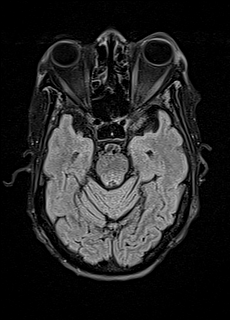
[im 35/52]
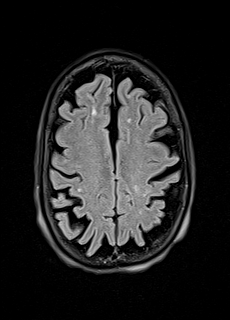
[im 52/52]
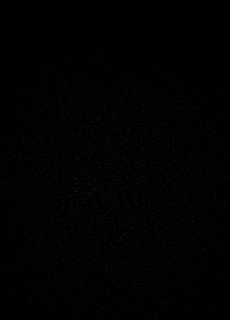

[Series 16: T1 · axial · 1.0mm · 0.94mm/px · z∈[-26,+112]mm · 12 of 144 slices shown (2 of 2)]
[im 1/144]
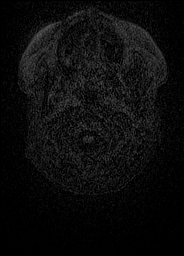
[im 14/144]
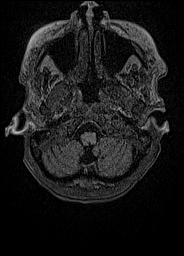
[im 27/144]
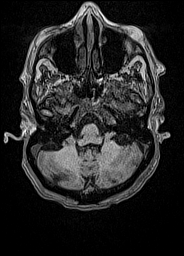
[im 40/144]
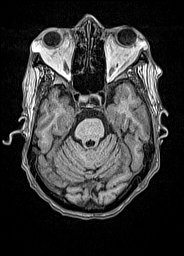
[im 53/144]
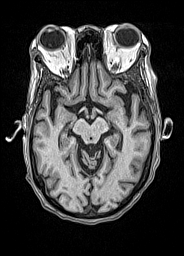
[im 66/144]
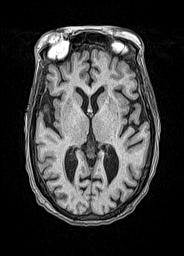
[im 79/144]
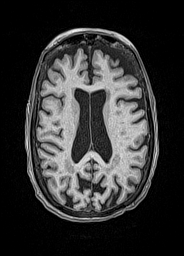
[im 92/144]
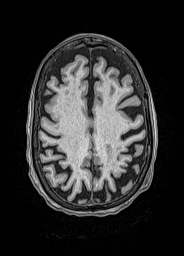
[im 105/144]
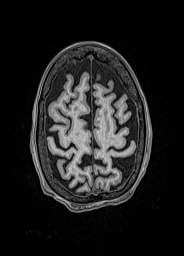
[im 118/144]
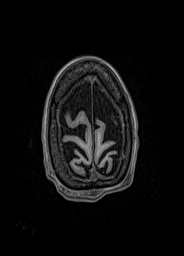
[im 131/144]
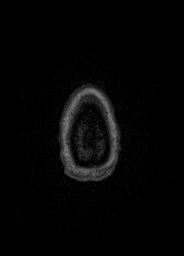
[im 144/144]
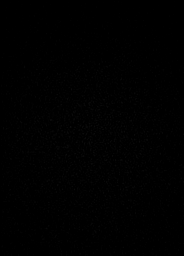

[Series 17: cor dwi_tracew · coronal · 5.0mm · 1.53mm/px · 5 of 54 slices shown]
[im 1/54]
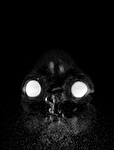
[im 14/54]
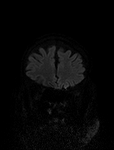
[im 27/54]
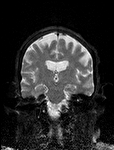
[im 40/54]
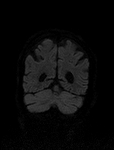
[im 54/54]
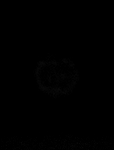

[Series 18: cor dwi_adc · coronal · 5.0mm · 1.53mm/px · 2 of 26 slices shown]
[im 1/26]
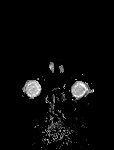
[im 26/26]
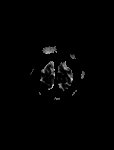

[Series 19: T2 · coronal · 5.0mm · 0.57mm/px · 3 of 34 slices shown (2 of 2)]
[im 1/34]
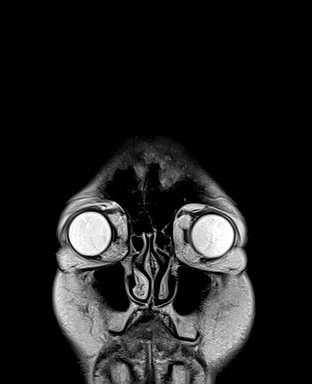
[im 17/34]
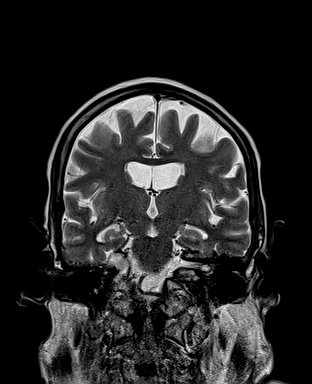
[im 34/34]
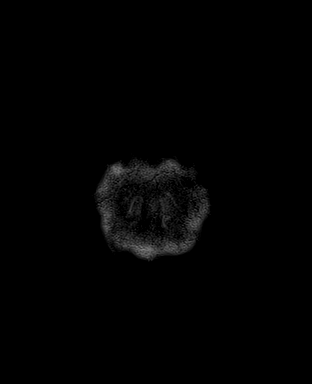

[48 of 48 positions shown; findings below may reference images not displayed]

FINDINGS: Brain: No acute infarction, hemorrhage, hydrocephalus, extra-axial
collection or mass lesion. Mild scattered T2 hyperintensities in the
white matter, nonspecific but compatible with chronic microvascular
disease. Mild atrophy.

Vascular: Major arterial flow voids are maintained at the skull
base.

Skull and upper cervical spine: Normal marrow signal.

Sinuses/Orbits: Clear sinuses.  Unremarkable orbits

Other: Small right and trace left mastoid effusions.
IMPRESSION: 1. No evidence of acute intracranial abnormality.
2. Mild chronic microvascular disease and atrophy.

## 2021-04-20 IMAGING — MR MR LUMBAR SPINE W/O CM
8 of 12 series · 33 of 48 positions shown · non-contrast
Comparison: CT lumbar spine [DATE].

CLINICAL DATA: Ataxia, nontraumatic, T-spine pathology suspected;
Low back pain, cauda equina syndrome suspected

EXAM:
MRI THORACIC AND LUMBAR SPINE WITHOUT CONTRAST
TECHNIQUE: Multiplanar and multiecho pulse sequences of the thoracic and lumbar
spine were obtained without intravenous contrast.

[Series 16: T1 · sagittal · 4.0mm · 1.72mm/px · 1 of 5 slices shown (1 of 4)]
[im 1/5]
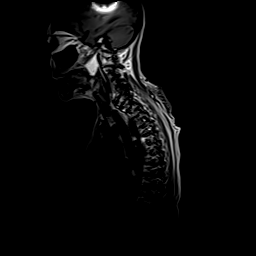

[Series 18: T1 · sagittal · 3.0mm · 0.94mm/px · 3 of 15 slices shown (2 of 4)]
[im 1/15]
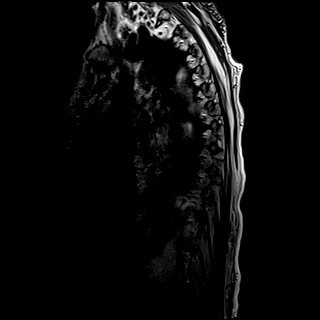
[im 8/15]
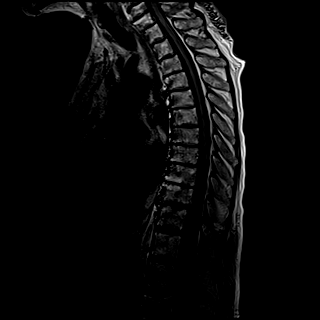
[im 15/15]
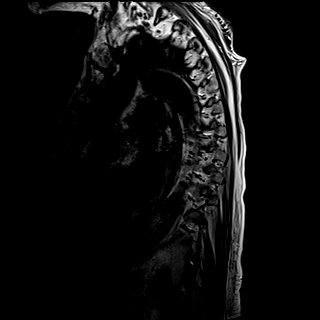

[Series 19: T2 · sagittal · 3.0mm · 0.83mm/px · 3 of 15 slices shown (1 of 4)]
[im 1/15]
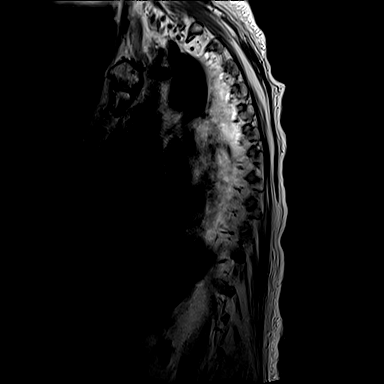
[im 8/15]
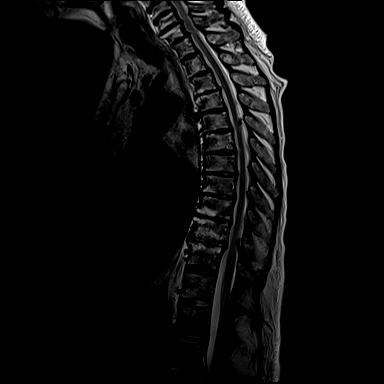
[im 15/15]
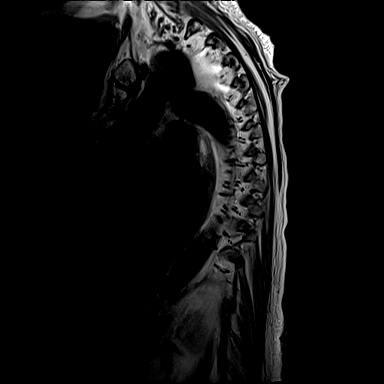

[Series 20: T2 · axial · 4.0mm · 0.78mm/px · z∈[-225,-62]mm · 7 of 42 slices shown (2 of 4)]
[im 1/42]
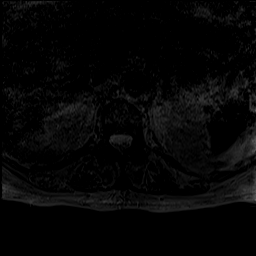
[im 7/42]
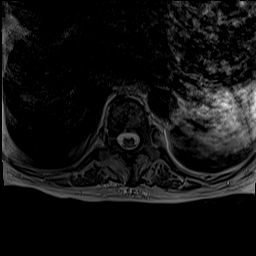
[im 14/42]
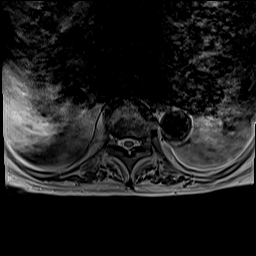
[im 21/42]
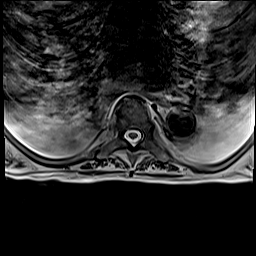
[im 28/42]
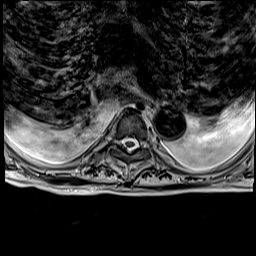
[im 35/42]
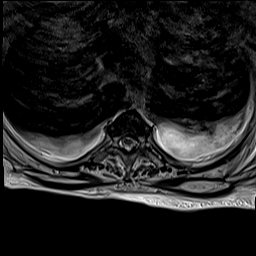
[im 42/42]
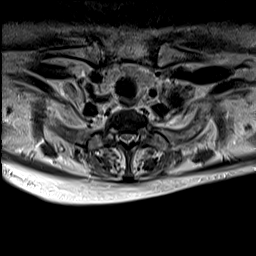

[Series 22: T1 · sagittal · 4.0mm · 0.81mm/px · 3 of 15 slices shown (3 of 4)]
[im 1/15]
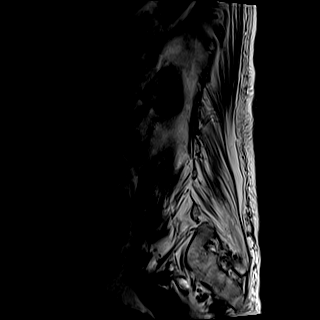
[im 8/15]
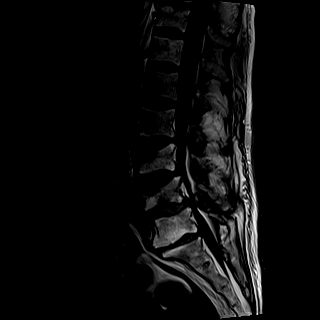
[im 15/15]
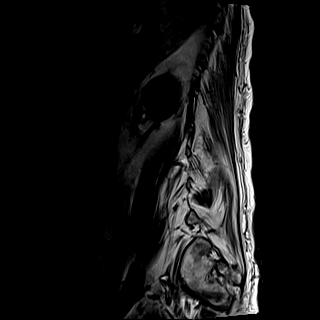

[Series 23: T2 · sagittal · 4.0mm · 0.81mm/px · 3 of 15 slices shown (3 of 4)]
[im 1/15]
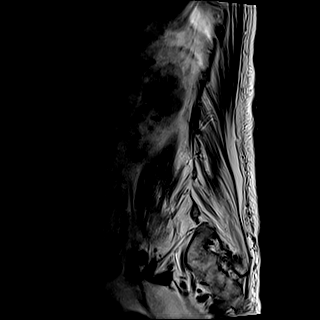
[im 8/15]
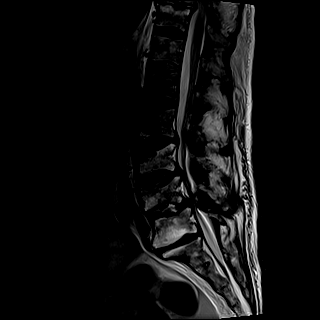
[im 15/15]
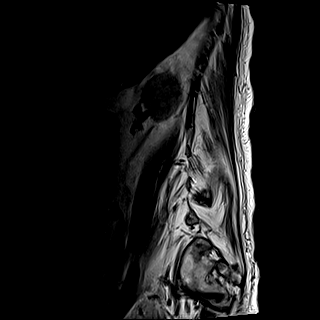

[Series 25: T2 · axial · 4.0mm · 0.62mm/px · z∈[-403,-218]mm · 7 of 38 slices shown (4 of 4)]
[im 1/38]
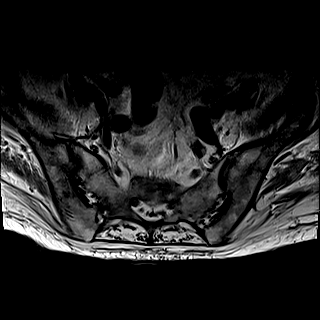
[im 7/38]
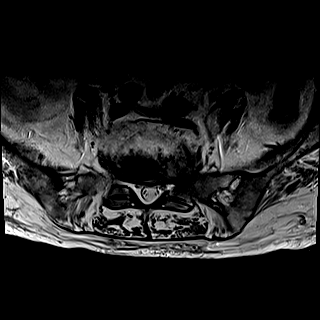
[im 13/38]
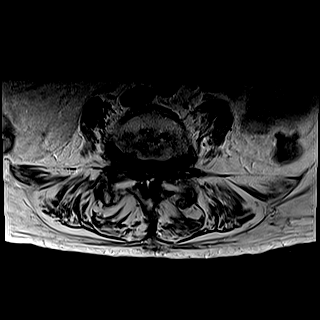
[im 19/38]
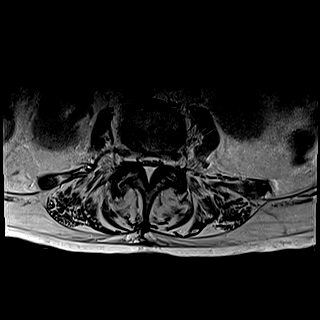
[im 25/38]
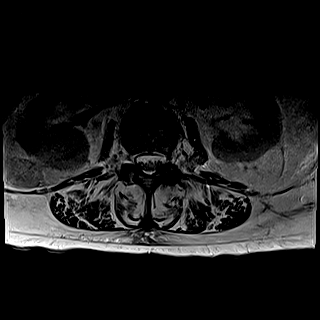
[im 31/38]
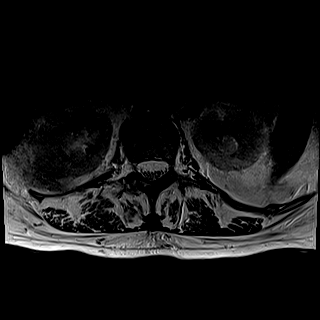
[im 38/38]
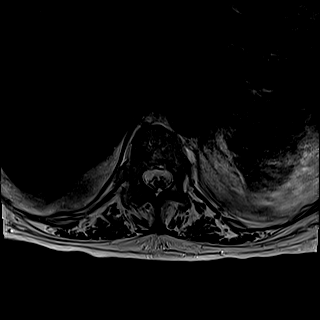

[Series 26: T1 · axial · 4.0mm · 0.39mm/px · z∈[-403,-253]mm · 6 of 38 slices shown (4 of 4)]
[im 1/38]
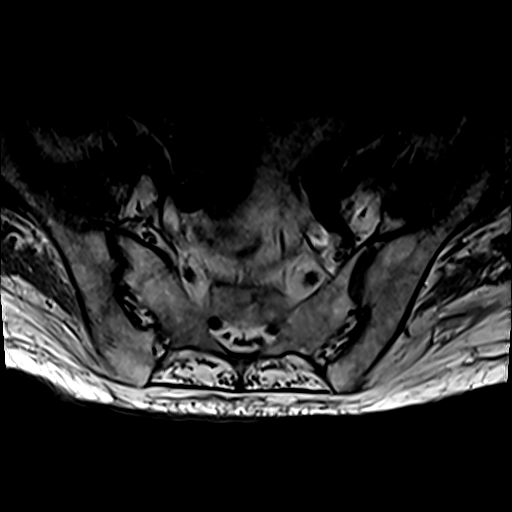
[im 7/38]
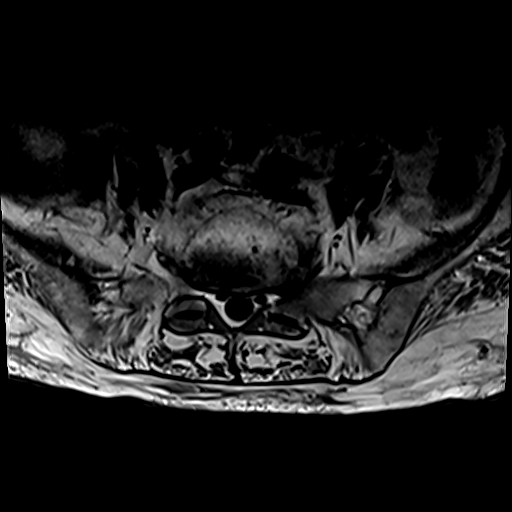
[im 13/38]
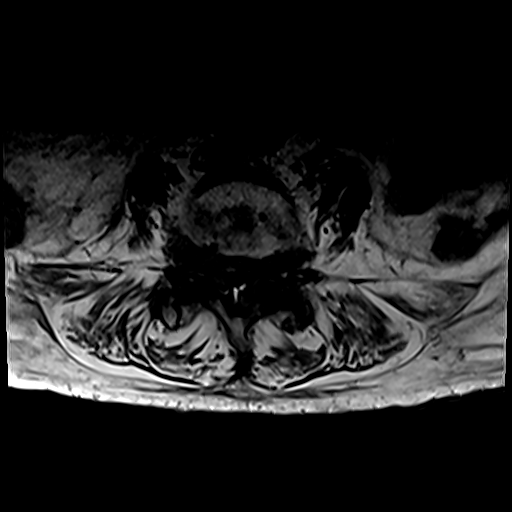
[im 19/38]
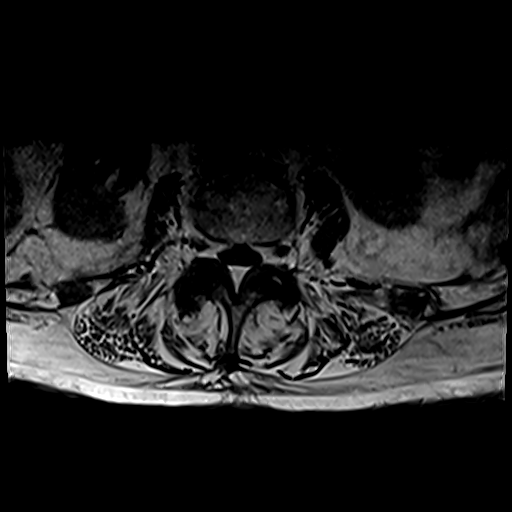
[im 25/38]
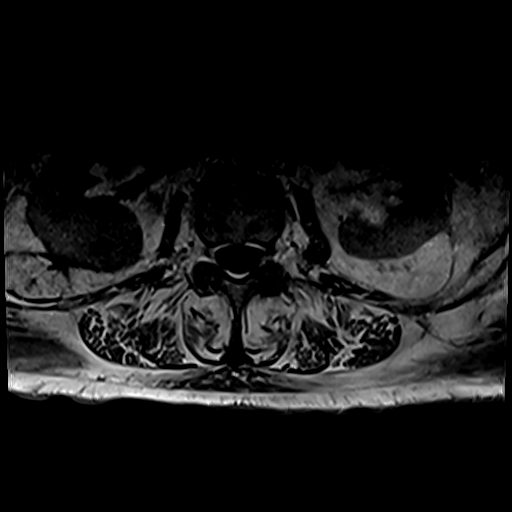
[im 31/38]
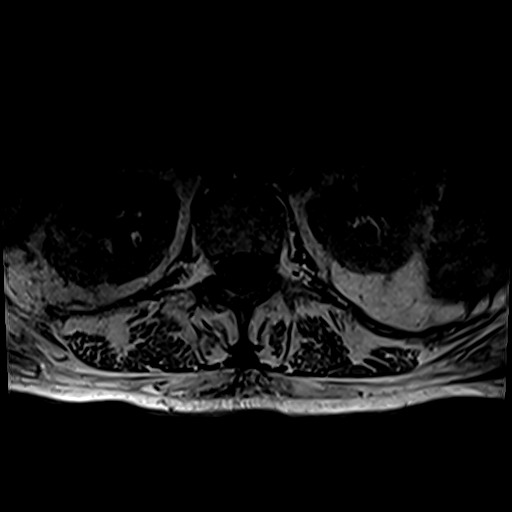

[33 of 48 positions shown; findings below may reference images not displayed]

FINDINGS: MRI THORACIC SPINE FINDINGS

Alignment:  Slight anterolisthesis of T1 on T2 and T2 on T3.

Vertebrae: Vertebral body heights are maintained.Multilevel
degenerative/discogenic endplate signal changes, greatest at T9-T10
where there is disc height loss. Otherwise, no focal marrow edema to
suggest acute fracture or discitis/osteomyelitis. Heterogeneous bone
marrow without suspicious bone lesion.

Cord:  Normal cord signal.

Paraspinal and other soft tissues: Moderate left greater than right
pleural effusions.

Disc levels:

Disc bulges and ligamentum flavum thickening at multiple levels
throughout the imaged lower cervical and thoracic spine. Although
partially imaged on sagittal imaging, probably mild to moderate
canal stenosis and potentially at least moderate foraminal stenosis
at C6-C7 and C7-T1. Mild canal stenosis at T5-T6, T7-T8, and
T10-T11.

Multilevel facet arthropathy. Foraminal stenosis is greatest and
moderate on the right at T10-T11. Otherwise, multilevel mild
foraminal stenosis in the thoracic spine.

MRI LUMBAR SPINE FINDINGS

Segmentation:  Standard

Alignment: Similar grade 1 anterolisthesis of L2 on L3, L3 on L4,
and L4 on L5.

Vertebrae: Redemonstrated L3 and L4 compression fractures with
similar height loss (35% L3 and 50% at L4). Edema within the
superior endplates at these levels suggests that the fractures are
unhealed. Edema within the L4 posterior elements. No substantial
retropulsion.

Conus medullaris and cauda equina: Conus extends to the T12-L1
level. Conus appears normal.

Paraspinal and other soft tissues: Paraspinal muscular atrophy.

Disc levels:

T12-L1: No significant disc protrusion, foraminal stenosis, or canal
stenosis.

L1-L2: No significant disc protrusion, foraminal stenosis, or canal
stenosis.

L2-L3: Grade 1 anterolisthesis. Left eccentric disc bulge, bulky
ligamentum flavum thickening and mild facet arthropathy. Prominent
dorsal epidural fat. Resulting moderate canal stenosis without
significant foraminal stenosis.

L3-L4: Grade 1 anterolisthesis. Left eccentric disc bulge with bulky
ligamentum flavum thickening and moderate bilateral facet
arthropathy. Prominent dorsal epidural fat. Resulting moderate to
severe canal stenosis without significant foraminal stenosis.

L4-L5: Grade 1 anterolisthesis. Uncovering the disc with uncovering
the disc and superimposed broad disc bulge. Severe bilateral facet
arthropathy with bulky ligamentum flavum thickening. Resulting
severe canal stenosis. Severe right and moderate left foraminal
stenosis.

L5-S1: Mild broad disc bulge and endplate spurring with
mild-to-moderate bilateral facet arthropathy. Resulting mild
bilateral foraminal stenosis and mild bilateral subarticular recess
stenosis without significant canal stenosis.
IMPRESSION: MR LUMBAR SPINE IMPRESSION

1. Unhealed L3 and L4 compression fractures with similar height loss
and marrow edema along the superior endplates. Edema within the L4
posterior elements could be posttraumatic or degenerative in
etiology.
2. At L4-L5, severe canal stenosis with severe right and moderate
left foraminal stenosis.
3. Moderate to severe canal stenosis at L3-L4 and moderate canal
stenosis at L2-L3.

MR THORACIC SPINE IMPRESSION

1. Moderate foraminal stenosis on the right at T10-T11. Otherwise,
multilevel mild foraminal stenosis in the thoracic spine.
2. Multilevel degenerative disc disease and mild canal stenosis in
the thoracic spine.
3. Potentially mild-to-moderate canal stenosis and at least moderate
foraminal stenosis C6-C7 and C7-T1, partially only imaged on
sagittal and incompletely assessed. Dedicated MRI of the cervical
spine could further characterize if clinically indicated.
4. Moderate left greater than right pleural effusions. Consider
dedicated chest imaging.

## 2021-04-20 IMAGING — MR MR THORACIC SPINE W/O CM
8 of 12 series · 33 of 48 positions shown · non-contrast
Comparison: CT lumbar spine [DATE].

CLINICAL DATA: Ataxia, nontraumatic, T-spine pathology suspected;
Low back pain, cauda equina syndrome suspected

EXAM:
MRI THORACIC AND LUMBAR SPINE WITHOUT CONTRAST
TECHNIQUE: Multiplanar and multiecho pulse sequences of the thoracic and lumbar
spine were obtained without intravenous contrast.

[Series 16: T1 · sagittal · 4.0mm · 1.72mm/px · 1 of 5 slices shown (1 of 4)]
[im 1/5]
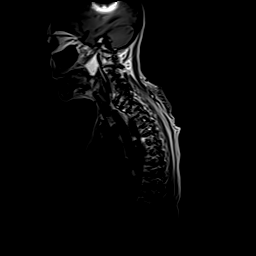

[Series 18: T1 · sagittal · 3.0mm · 0.94mm/px · 3 of 15 slices shown (2 of 4)]
[im 1/15]
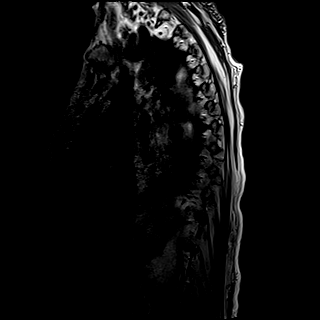
[im 8/15]
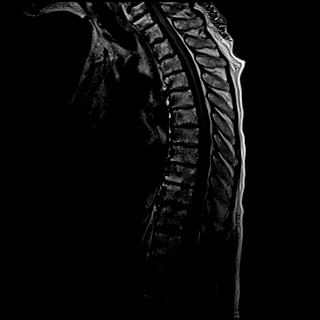
[im 15/15]
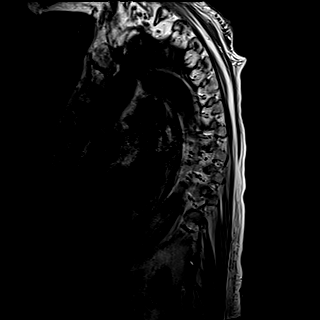

[Series 19: T2 · sagittal · 3.0mm · 0.83mm/px · 3 of 15 slices shown (1 of 4)]
[im 1/15]
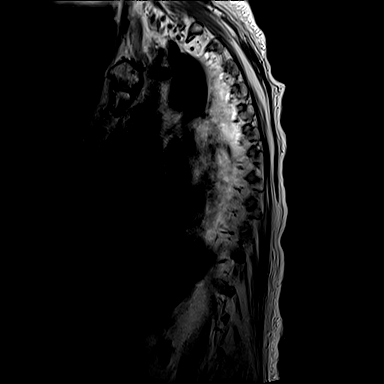
[im 8/15]
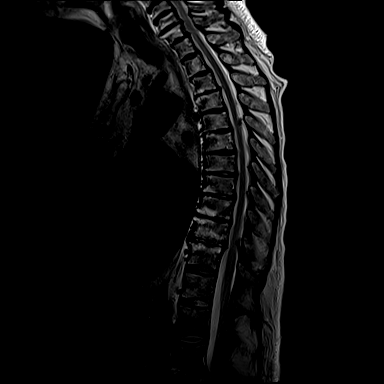
[im 15/15]
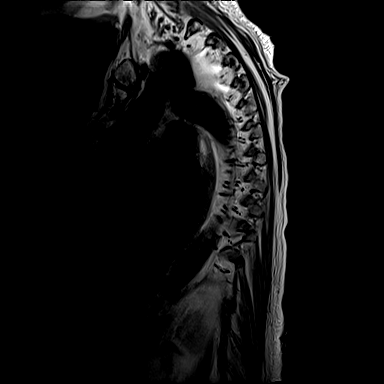

[Series 20: T2 · axial · 4.0mm · 0.78mm/px · z∈[-225,-62]mm · 7 of 42 slices shown (2 of 4)]
[im 1/42]
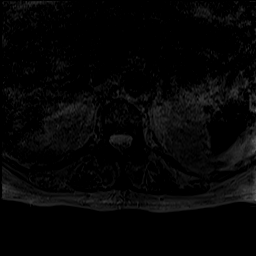
[im 7/42]
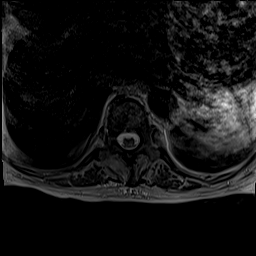
[im 14/42]
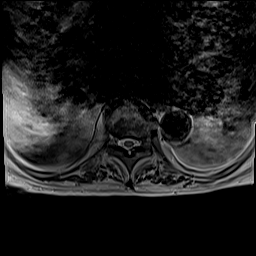
[im 21/42]
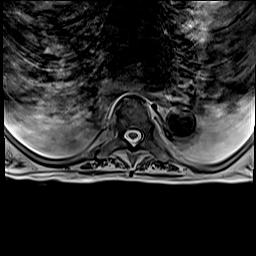
[im 28/42]
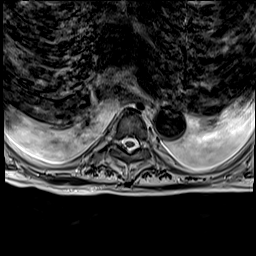
[im 35/42]
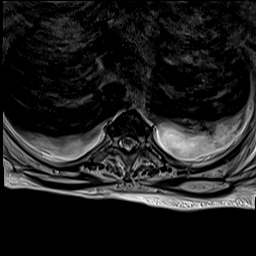
[im 42/42]
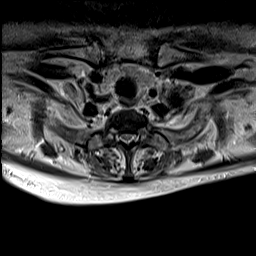

[Series 22: T1 · sagittal · 4.0mm · 0.81mm/px · 3 of 15 slices shown (3 of 4)]
[im 1/15]
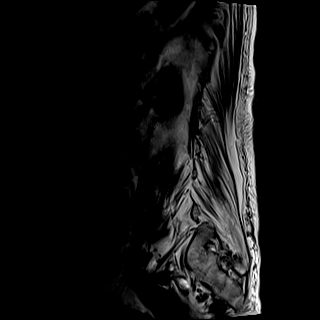
[im 8/15]
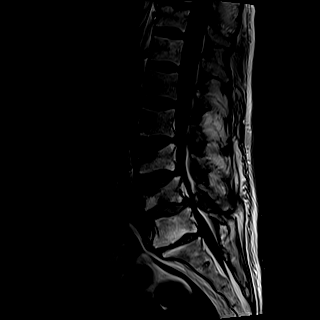
[im 15/15]
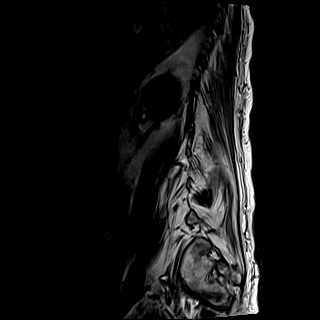

[Series 23: T2 · sagittal · 4.0mm · 0.81mm/px · 3 of 15 slices shown (3 of 4)]
[im 1/15]
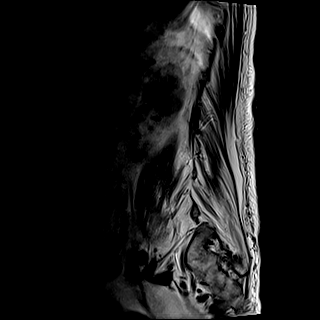
[im 8/15]
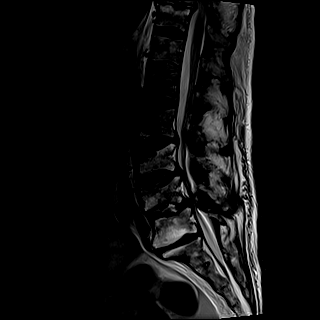
[im 15/15]
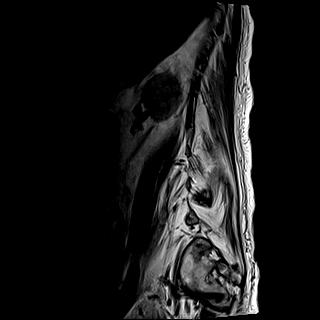

[Series 25: T2 · axial · 4.0mm · 0.62mm/px · z∈[-403,-218]mm · 7 of 38 slices shown (4 of 4)]
[im 1/38]
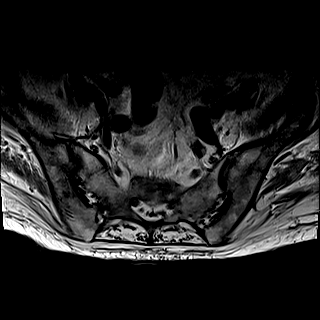
[im 7/38]
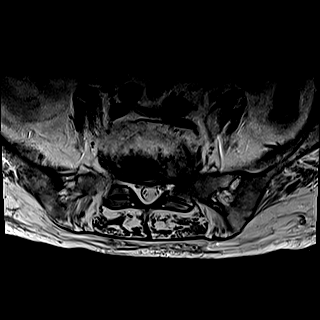
[im 13/38]
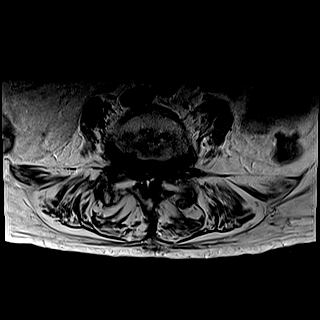
[im 19/38]
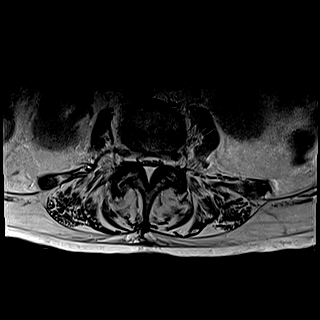
[im 25/38]
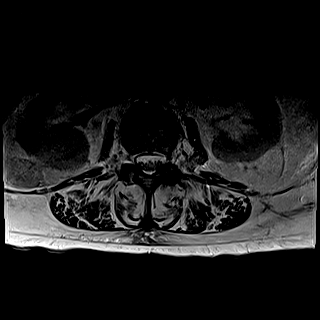
[im 31/38]
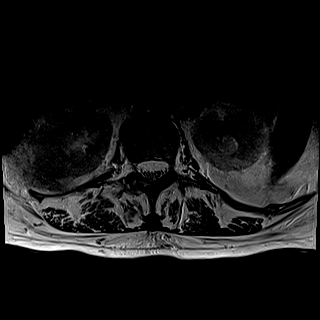
[im 38/38]
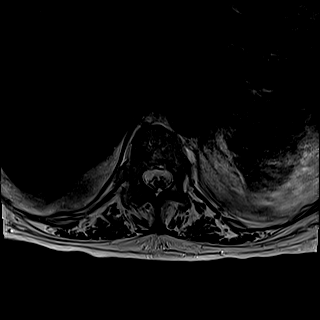

[Series 26: T1 · axial · 4.0mm · 0.39mm/px · z∈[-403,-253]mm · 6 of 38 slices shown (4 of 4)]
[im 1/38]
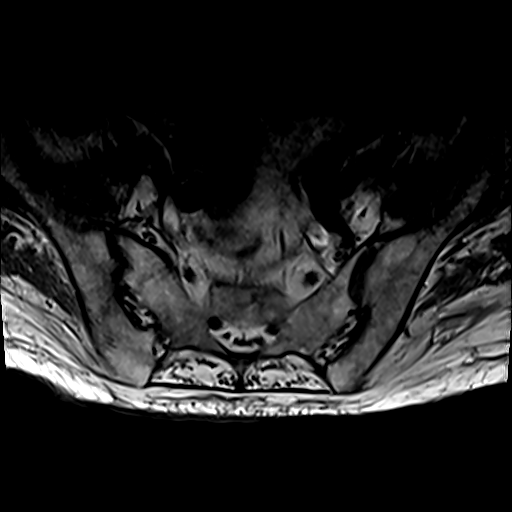
[im 7/38]
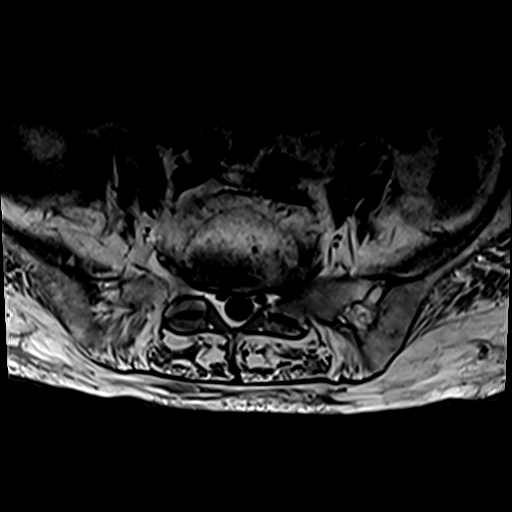
[im 13/38]
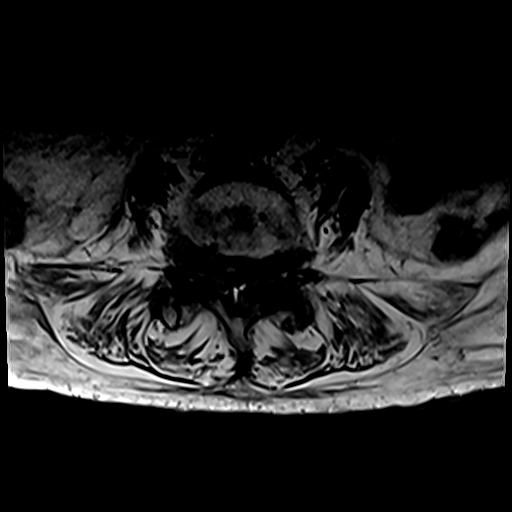
[im 19/38]
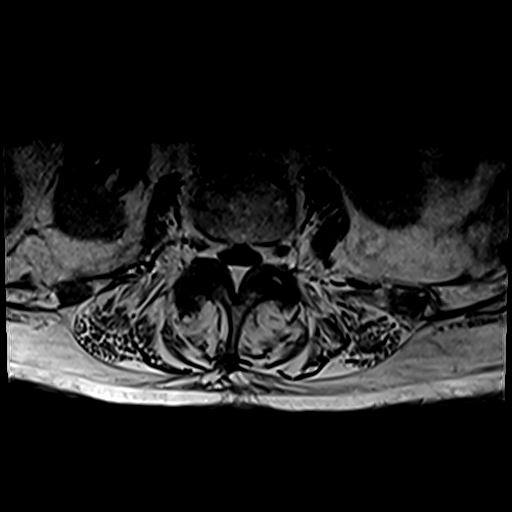
[im 25/38]
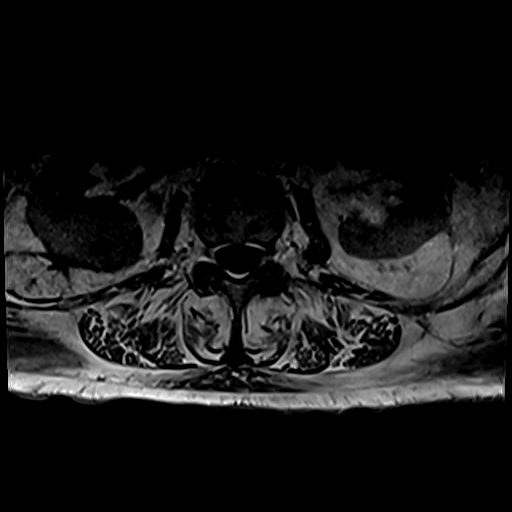
[im 31/38]
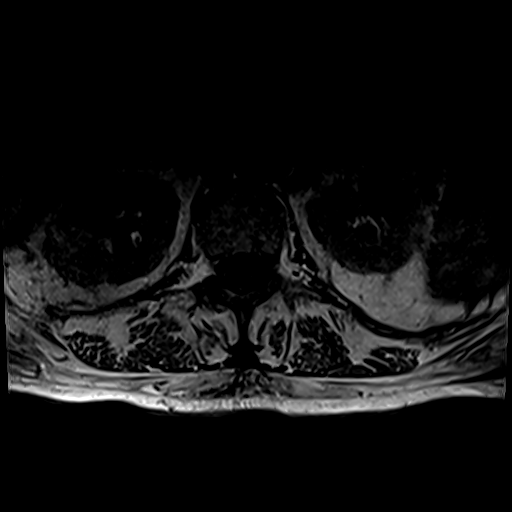

[33 of 48 positions shown; findings below may reference images not displayed]

FINDINGS: MRI THORACIC SPINE FINDINGS

Alignment:  Slight anterolisthesis of T1 on T2 and T2 on T3.

Vertebrae: Vertebral body heights are maintained.Multilevel
degenerative/discogenic endplate signal changes, greatest at T9-T10
where there is disc height loss. Otherwise, no focal marrow edema to
suggest acute fracture or discitis/osteomyelitis. Heterogeneous bone
marrow without suspicious bone lesion.

Cord:  Normal cord signal.

Paraspinal and other soft tissues: Moderate left greater than right
pleural effusions.

Disc levels:

Disc bulges and ligamentum flavum thickening at multiple levels
throughout the imaged lower cervical and thoracic spine. Although
partially imaged on sagittal imaging, probably mild to moderate
canal stenosis and potentially at least moderate foraminal stenosis
at C6-C7 and C7-T1. Mild canal stenosis at T5-T6, T7-T8, and
T10-T11.

Multilevel facet arthropathy. Foraminal stenosis is greatest and
moderate on the right at T10-T11. Otherwise, multilevel mild
foraminal stenosis in the thoracic spine.

MRI LUMBAR SPINE FINDINGS

Segmentation:  Standard

Alignment: Similar grade 1 anterolisthesis of L2 on L3, L3 on L4,
and L4 on L5.

Vertebrae: Redemonstrated L3 and L4 compression fractures with
similar height loss (35% L3 and 50% at L4). Edema within the
superior endplates at these levels suggests that the fractures are
unhealed. Edema within the L4 posterior elements. No substantial
retropulsion.

Conus medullaris and cauda equina: Conus extends to the T12-L1
level. Conus appears normal.

Paraspinal and other soft tissues: Paraspinal muscular atrophy.

Disc levels:

T12-L1: No significant disc protrusion, foraminal stenosis, or canal
stenosis.

L1-L2: No significant disc protrusion, foraminal stenosis, or canal
stenosis.

L2-L3: Grade 1 anterolisthesis. Left eccentric disc bulge, bulky
ligamentum flavum thickening and mild facet arthropathy. Prominent
dorsal epidural fat. Resulting moderate canal stenosis without
significant foraminal stenosis.

L3-L4: Grade 1 anterolisthesis. Left eccentric disc bulge with bulky
ligamentum flavum thickening and moderate bilateral facet
arthropathy. Prominent dorsal epidural fat. Resulting moderate to
severe canal stenosis without significant foraminal stenosis.

L4-L5: Grade 1 anterolisthesis. Uncovering the disc with uncovering
the disc and superimposed broad disc bulge. Severe bilateral facet
arthropathy with bulky ligamentum flavum thickening. Resulting
severe canal stenosis. Severe right and moderate left foraminal
stenosis.

L5-S1: Mild broad disc bulge and endplate spurring with
mild-to-moderate bilateral facet arthropathy. Resulting mild
bilateral foraminal stenosis and mild bilateral subarticular recess
stenosis without significant canal stenosis.
IMPRESSION: MR LUMBAR SPINE IMPRESSION

1. Unhealed L3 and L4 compression fractures with similar height loss
and marrow edema along the superior endplates. Edema within the L4
posterior elements could be posttraumatic or degenerative in
etiology.
2. At L4-L5, severe canal stenosis with severe right and moderate
left foraminal stenosis.
3. Moderate to severe canal stenosis at L3-L4 and moderate canal
stenosis at L2-L3.

MR THORACIC SPINE IMPRESSION

1. Moderate foraminal stenosis on the right at T10-T11. Otherwise,
multilevel mild foraminal stenosis in the thoracic spine.
2. Multilevel degenerative disc disease and mild canal stenosis in
the thoracic spine.
3. Potentially mild-to-moderate canal stenosis and at least moderate
foraminal stenosis C6-C7 and C7-T1, partially only imaged on
sagittal and incompletely assessed. Dedicated MRI of the cervical
spine could further characterize if clinically indicated.
4. Moderate left greater than right pleural effusions. Consider
dedicated chest imaging.

## 2021-04-20 MED ORDER — ACETAMINOPHEN 325 MG PO TABS
650.0000 mg | ORAL_TABLET | Freq: Four times a day (QID) | ORAL | Status: DC | PRN
  Administered 2021-04-21 – 2021-04-23 (×4): 650 mg via ORAL
  Filled 2021-04-20 (×5): qty 2

## 2021-04-20 MED ORDER — SODIUM CHLORIDE 0.9 % IV SOLN
1.0000 g | Freq: Once | INTRAVENOUS | Status: AC
  Administered 2021-04-20: 1 g via INTRAVENOUS
  Filled 2021-04-20: qty 10

## 2021-04-20 MED ORDER — MAGNESIUM SULFATE 2 GM/50ML IV SOLN
2.0000 g | Freq: Once | INTRAVENOUS | Status: AC
  Administered 2021-04-20: 2 g via INTRAVENOUS
  Filled 2021-04-20: qty 50

## 2021-04-20 MED ORDER — ONDANSETRON HCL 4 MG PO TABS: 4.0000 mg | ORAL_TABLET | Freq: Four times a day (QID) | ORAL | Status: DC | PRN

## 2021-04-20 MED ORDER — HYDROXYZINE HCL 25 MG PO TABS
50.0000 mg | ORAL_TABLET | Freq: Three times a day (TID) | ORAL | Status: DC
  Administered 2021-04-20 – 2021-04-25 (×16): 50 mg via ORAL
  Filled 2021-04-20 (×16): qty 2

## 2021-04-20 MED ORDER — POTASSIUM CHLORIDE IN NACL 40-0.9 MEQ/L-% IV SOLN
INTRAVENOUS | Status: AC
  Filled 2021-04-20: qty 1000

## 2021-04-20 MED ORDER — POTASSIUM CHLORIDE CRYS ER 20 MEQ PO TBCR: 20.0000 meq | EXTENDED_RELEASE_TABLET | Freq: Every day | ORAL | Status: DC

## 2021-04-20 MED ORDER — ENOXAPARIN SODIUM 40 MG/0.4ML IJ SOSY: 40.0000 mg | PREFILLED_SYRINGE | INTRAMUSCULAR | Status: DC

## 2021-04-20 MED ORDER — SERTRALINE HCL 25 MG PO TABS
25.0000 mg | ORAL_TABLET | Freq: Every day | ORAL | Status: DC
  Administered 2021-04-20 – 2021-04-25 (×6): 25 mg via ORAL
  Filled 2021-04-20 (×6): qty 1

## 2021-04-20 MED ORDER — PANTOPRAZOLE SODIUM 40 MG PO TBEC
40.0000 mg | DELAYED_RELEASE_TABLET | Freq: Every day | ORAL | Status: DC
  Administered 2021-04-20 – 2021-04-25 (×6): 40 mg via ORAL
  Filled 2021-04-20 (×6): qty 1

## 2021-04-20 MED ORDER — SODIUM CHLORIDE 0.9 % IV SOLN: 1.0000 g | INTRAVENOUS | Status: DC

## 2021-04-20 MED ORDER — ACETAMINOPHEN 650 MG RE SUPP: 650.0000 mg | Freq: Four times a day (QID) | RECTAL | Status: DC | PRN

## 2021-04-20 MED ORDER — ONDANSETRON HCL 4 MG/2ML IJ SOLN: 4.0000 mg | Freq: Four times a day (QID) | INTRAMUSCULAR | Status: DC | PRN

## 2021-04-20 MED ORDER — ENSURE ENLIVE PO LIQD
237.0000 mL | Freq: Three times a day (TID) | ORAL | Status: DC
  Administered 2021-04-20 – 2021-04-25 (×9): 237 mL via ORAL
  Filled 2021-04-20: qty 237

## 2021-04-20 NOTE — Progress Notes (Signed)
Arrived to ED- Site already established. Cancel IV Team consult.

## 2021-04-20 NOTE — H&P (Signed)
History and Physical    Debbie Barry ION:[id number] DOB: 1950/07/17 DOA: 04/19/2021  PCP: Glendale Chard, MD  Patient coming from: Home.  I have personally briefly reviewed patient's old medical records in [place]  Chief Complaint: Unable to move legs well.  HPI: Debbie Barry is a 70 y.o. female with medical history significant of fatty liver disease, GERD, hypertension, osteomyelitis of the right third toe, peripheral vascular disease, history of UGI bleed due to peptic ulcer who was recently admitted and discharged for generalized weakness, gastroenteritis, iron overload and macular rash.  She is coming in today with complaints of progressively worse weakness so both lower extremities particularly on the LLE.  She has been having falls due to her LLE giving up.  Both of her legs are numb.  Her husband has been helping her with transfers at home but he is struggling with this task on occasion as he is 68 years old with some medical conditions or his own. She had a catheter placed a couple weeks ago due to urinary retention.  She has not had a bowel movement in the past week.  Her rectal tone is decreased per EDP exam.  She had an episode of chest pain 2 nights ago and has had them occasionally for several months.  She gets palpitations twice a week.  No PND, orthopnea or significant pitting edema of the lower extremities.  She continues to have abdominal pain although not as intense and frequent emesis after oral intake.  No abdominal pain at the time of examination.  She has diarrhea about a week ago but no BM since.  No melena or hematochezia reported.  No flank or suprapubic pain.  She stated that her macular rash and associated itching have improved since discharge.  Review of systems with mild sore throat otherwise, no rhinorrhea, productive cough, wheezing or hemoptysis.  No polyuria, polydipsia, polyphagia or blurred vision.  ED Course: Initial vital signs were  temperature 98.8 F, pulse 105, respirations 16, BP 142/83 mmHg while lying on the stretcher and O2 sat 95% on room air.  The patient was given 1 g of Rocephin IVPB.  Lab work: Her urinalysis showed glucosuria over 100 and proteinuria 30 mg/dL.  There was large hemoglobinuria, moderate bilirubin, positive nitrites and leukocyte esterase.  Microscopic examination shows many bacteria and more than 50 WBC per hpf.  Coronavirus and influenza PCR was negative.  UDS was positive for opiates.  CBC was normal with a white count 7.2 with 76% neutrophils, hemoglobin 13.6 g/dL and platelets 260.  Coagulation parameters were unremarkable.  CMP showed normal electrolytes when calcium was corrected to albumin.  She also had an normal glucose and renal function.  Total protein is 5.3, albumin 1.8 g/dL.  AST 44, ALT 26 and alkaline phosphatase 167 units/L.  Total bilirubin was 1.3 mg/dL.  Imaging: CT head without contrast showed chronic microvascular changes and atrophy but no acute finding.  MRI brain without contrast also showed similar findings.  MR thoracic/lumbar spine showed only he will L3 and L4 compression fractures with similar height loss and marrow edema along the superior endplates.  There is edema within the L4 posterior elements likely posttraumatic or degenerative in etiology.  L4-L5 severe, stenosis with severe right and moderate left foraminal stenosis.  Multiple DDD, mild and moderate foraminal stenosis findings on lower C-spine levels and thoracic spine.  Please see images and full radiology report for further detail.  Review of Systems: As per HPI otherwise  all other systems reviewed and are negative.  Past Medical History:  Diagnosis Date   Fatty liver    GERD (gastroesophageal reflux disease)    Hypertension    Osteomyelitis ([place])    right third toe   Peripheral vascular disease ([place])    Ulcer 08/14/20    Past Surgical History:  Procedure Laterality Date   AMPUTATION Right 08/08/2017    Procedure: AMPUTATION RIGHT 3RD TOE;  Surgeon: Newt Minion, MD;  Location: [facility];  Service: Orthopedics;  Laterality: Right;   BIOPSY  08/14/2020   Procedure: BIOPSY;  Surgeon: Clarene Essex, MD;  Location: [facility];  Service: Endoscopy;;   Bunionectomy Right 2006     Right   Bunionectony Left 2006      August   COLONOSCOPY N/A 01/21/2014   Procedure: COLONOSCOPY;  Surgeon: Beryle Beams, MD;  Location: [facility];  Service: Endoscopy;  Laterality: N/A;   ESOPHAGOGASTRODUODENOSCOPY (EGD) WITH PROPOFOL N/A 08/14/2020   Procedure: ESOPHAGOGASTRODUODENOSCOPY (EGD) WITH PROPOFOL;  Surgeon: Clarene Essex, MD;  Location: [facility];  Service: Endoscopy;  Laterality: N/A;   TUBAL LIGATION     WISDOM TOOTH EXTRACTION      Social History  reports that she has never smoked. She has never used smokeless tobacco. She reports current alcohol use of about 5.0 standard drinks per week. She reports that she does not use drugs.  No Known Allergies  Family History  Problem Relation Age of Onset   Breast cancer Mother    Colon cancer Mother    Heart Problems Mother    Hypertension Mother    Multiple myeloma Mother    Arthritis Mother    Cancer Mother    Heart disease Mother    Hypertension Father    Heart disease Father    Multiple myeloma Father    Arthritis Father    Cancer Father    Prior to Admission medications   Medication Sig Start Date End Date Taking? Authorizing Provider  Caffeine-Magnesium Salicylate (DIUREX PO) Take 2 tablets by mouth in the morning and at bedtime.   Yes [provider]  Cholecalciferol (VITAMIN D3) 5000 units CAPS Take 5,000 Units by mouth daily.   Yes [provider]  feeding supplement (ENSURE ENLIVE / ENSURE PLUS) LIQD Take 237 mLs by mouth 3 (three) times daily between meals. 04/04/21  Yes Mariel Aloe, MD  hydrOXYzine (ATARAX/VISTARIL) 50 MG tablet Take 1 tablet (50 mg total) by mouth 3 (three) times daily. 04/04/21 05/04/21 Yes Mariel Aloe, MD  Multiple Vitamin (MULTIVITAMIN WITH MINERALS) TABS tablet Take 1 tablet by mouth daily.   Yes [provider]  ondansetron (ZOFRAN ODT) 4 MG disintegrating tablet Take 1 tablet (4 mg total) by mouth every 8 (eight) hours as needed for nausea or vomiting. 04/04/21  Yes Mariel Aloe, MD  pantoprazole (PROTONIX) 40 MG tablet Take 1 tablet (40 mg total) by mouth daily. 04/04/21  Yes Mariel Aloe, MD  potassium chloride SA (KLOR-CON) 20 MEQ tablet Take 1 tablet (20 mEq total) by mouth 2 (two) times daily. Patient taking differently: Take 20 mEq by mouth daily. 02/14/21  Yes Magrinat, Virgie Dad, MD  sertraline (ZOLOFT) 25 MG tablet Take 1 tablet (25 mg total) by mouth daily. 02/01/21  Yes Ghumman, Ramandeep, NP  Tetrahydrozoline HCl (VISINE OP) Place 2 drops into both eyes daily as needed (for dry/irritated eyes).    Yes [provider]  triamcinolone ointment (KENALOG) 0.5 % Apply 1 application topically  2 (two) times daily. Patient taking differently: Apply 1 application topically 2 (two) times daily as needed (rash/itching). 03/28/21  Yes Glendale Chard, MD    Physical Exam: Vitals:   04/20/21 0530 04/20/21 0805 04/20/21 0825 04/20/21 0925  BP: 133/80 136/85  127/82  Pulse: 95 89  97  Resp: (!) $RemoveB'21 16  14  'qYdxGwWO$ Temp:  98.5 F (36.9 C) 98.1 F (36.7 C)   TempSrc:  Oral Rectal   SpO2: 99% 100%  98%  Weight:      Height:        Constitutional: Chronically ill-appearing.  NAD, calm, comfortable Eyes:  Mild exophthalmos.PERRL, lids and conjunctivae normal.  Injected sclera. ENMT: Mucous membranes and lips are mildly dry.. Posterior pharynx clear of any exudate or lesions.  Neck: normal, supple, no masses, no thyromegaly Respiratory: Decreased breath sounds in bases on anterior exam, otherwise clear to auscultation bilaterally, no wheezing, no crackles. Normal respiratory effort. No accessory muscle use.  Cardiovascular: Regular rate and rhythm, no murmurs / rubs /  gallops. No extremity edema. 2+ pedal pulses. No carotid bruits.  Abdomen: No distention.  Bowel sounds positive.  Soft, no tenderness, no masses palpated. No hepatosplenomegaly. Musculoskeletal: no clubbing / cyanosis. Good ROM, no contractures. Normal muscle tone.  Skin: Multiple areas of hyperkeratosis particularly on trunk and lower extremities.  Some minor excoriations from scratching. Neurologic: CN 2-12 grossly intact.  Subjective decrease in sensation of the lower extremities.  Bilateral patellar DTRs are depressed.  She is able to move her toes but unable to lift her legs. Psychiatric: Normal judgment and insight. Alert and oriented x 3.  Labs on Admission: I have personally reviewed following labs and imaging studies  CBC: Recent Labs  Lab 04/20/21 0100 04/20/21 0111  WBC 7.2  --   NEUTROABS 5.4  --   HGB 13.6 15.3*  HCT 38.2 45.0  MCV 95.3  --   PLT 260  --     Basic Metabolic Panel: Recent Labs  Lab 04/20/21 0111 04/20/21 0120  NA 138 139  K 3.1* 3.5  CL 101 103  CO2  --  25  GLUCOSE 76 79  BUN 9 10  CREATININE 0.50 0.71  CALCIUM  --  7.7*    GFR: Estimated Creatinine Clearance: 51.5 mL/min (by C-G formula based on SCr of 0.71 mg/dL).  Liver Function Tests: Recent Labs  Lab 04/20/21 0120  AST 44*  ALT 26  ALKPHOS 167*  BILITOT 1.3*  PROT 5.3*  ALBUMIN 1.8*    Urine analysis:    Component Value Date/Time   COLORURINE AMBER (A) 04/19/2021 2251   APPEARANCEUR TURBID (A) 04/19/2021 2251   LABSPEC 1.025 04/19/2021 2251   PHURINE 6.0 04/19/2021 2251   GLUCOSEU 100 (A) 04/19/2021 2251   HGBUR LARGE (A) 04/19/2021 2251   BILIRUBINUR MODERATE (A) 04/19/2021 2251   BILIRUBINUR negative 03/30/2020 1215   KETONESUR NEGATIVE 04/19/2021 2251   PROTEINUR 30 (A) 04/19/2021 2251        NITRITE POSITIVE (A) 04/19/2021 2251   LEUKOCYTESUR MODERATE (A) 04/19/2021 2251    Radiological Exams on Admission: CT HEAD WO CONTRAST (5MM)  Result Date:  04/19/2021 CLINICAL DATA:  Neuro deficit, acute, stroke suspected EXAM: CT HEAD WITHOUT CONTRAST TECHNIQUE: Contiguous axial images were obtained from the base of the skull through the vertex without intravenous contrast. COMPARISON:  02/22/2021 FINDINGS: Brain: There is atrophy and chronic small vessel disease changes. No acute intracranial abnormality. Specifically, no hemorrhage, hydrocephalus, mass lesion, acute infarction, or  significant intracranial injury. Vascular: No hyperdense vessel or unexpected calcification. Skull: No acute calvarial abnormality. Sinuses/Orbits: No acute findings Other: None IMPRESSION: Atrophy, chronic microvascular disease. No acute intracranial abnormality. Electronically Signed   By: Rolm Baptise M.D.   On: 04/19/2021 22:52   MR BRAIN WO CONTRAST  Result Date: 04/20/2021 CLINICAL DATA:  Neuro deficit, acute, stroke suspected EXAM: MRI HEAD WITHOUT CONTRAST TECHNIQUE: Multiplanar, multiecho pulse sequences of the brain and surrounding structures were obtained without intravenous contrast. COMPARISON:  CT head10/20/2022. FINDINGS: Brain: No acute infarction, hemorrhage, hydrocephalus, extra-axial collection or mass lesion. Mild scattered T2 hyperintensities in the white matter, nonspecific but compatible with chronic microvascular disease. Mild atrophy. Vascular: Major arterial flow voids are maintained at the skull base. Skull and upper cervical spine: Normal marrow signal. Sinuses/Orbits: Clear sinuses.  Unremarkable orbits Other: Small right and trace left mastoid effusions. IMPRESSION: 1. No evidence of acute intracranial abnormality. 2. Mild chronic microvascular disease and atrophy. Electronically Signed   By: Margaretha Sheffield M.D.   On: 04/20/2021 06:55   MR THORACIC SPINE WO CONTRAST  Result Date: 04/20/2021 CLINICAL DATA:  Ataxia, nontraumatic, T-spine pathology suspected; Low back pain, cauda equina syndrome suspected EXAM: MRI THORACIC AND LUMBAR SPINE WITHOUT  CONTRAST TECHNIQUE: Multiplanar and multiecho pulse sequences of the thoracic and lumbar spine were obtained without intravenous contrast. COMPARISON:  CT lumbar spine 02/22/2021. FINDINGS: MRI THORACIC SPINE FINDINGS Alignment:  Slight anterolisthesis of T1 on T2 and T2 on T3. Vertebrae: Vertebral body heights are maintained.Multilevel degenerative/discogenic endplate signal changes, greatest at T9-T10 where there is disc height loss. Otherwise, no focal marrow edema to suggest acute fracture or discitis/osteomyelitis. Heterogeneous bone marrow without suspicious bone lesion. Cord:  Normal cord signal. Paraspinal and other soft tissues: Moderate left greater than right pleural effusions. Disc levels: Disc bulges and ligamentum flavum thickening at multiple levels throughout the imaged lower cervical and thoracic spine. Although partially imaged on sagittal imaging, probably mild to moderate canal stenosis and potentially at least moderate foraminal stenosis at C6-C7 and C7-T1. Mild canal stenosis at T5-T6, T7-T8, and T10-T11. Multilevel facet arthropathy. Foraminal stenosis is greatest and moderate on the right at T10-T11. Otherwise, multilevel mild foraminal stenosis in the thoracic spine. MRI LUMBAR SPINE FINDINGS Segmentation:  Standard Alignment: Similar grade 1 anterolisthesis of L2 on L3, L3 on L4, and L4 on L5. Vertebrae: Redemonstrated L3 and L4 compression fractures with similar height loss (35% L3 and 50% at L4). Edema within the superior endplates at these levels suggests that the fractures are unhealed. Edema within the L4 posterior elements. No substantial retropulsion. Conus medullaris and cauda equina: Conus extends to the T12-L1 level. Conus appears normal. Paraspinal and other soft tissues: Paraspinal muscular atrophy. Disc levels: T12-L1: No significant disc protrusion, foraminal stenosis, or canal stenosis. L1-L2: No significant disc protrusion, foraminal stenosis, or canal stenosis. L2-L3: Grade  1 anterolisthesis. Left eccentric disc bulge, bulky ligamentum flavum thickening and mild facet arthropathy. Prominent dorsal epidural fat. Resulting moderate canal stenosis without significant foraminal stenosis. L3-L4: Grade 1 anterolisthesis. Left eccentric disc bulge with bulky ligamentum flavum thickening and moderate bilateral facet arthropathy. Prominent dorsal epidural fat. Resulting moderate to severe canal stenosis without significant foraminal stenosis. L4-L5: Grade 1 anterolisthesis. Uncovering the disc with uncovering the disc and superimposed broad disc bulge. Severe bilateral facet arthropathy with bulky ligamentum flavum thickening. Resulting severe canal stenosis. Severe right and moderate left foraminal stenosis. L5-S1: Mild broad disc bulge and endplate spurring with mild-to-moderate bilateral facet arthropathy. Resulting mild bilateral  foraminal stenosis and mild bilateral subarticular recess stenosis without significant canal stenosis. IMPRESSION: MR LUMBAR SPINE IMPRESSION 1. Unhealed L3 and L4 compression fractures with similar height loss and marrow edema along the superior endplates. Edema within the L4 posterior elements could be posttraumatic or degenerative in etiology. 2. At L4-L5, severe canal stenosis with severe right and moderate left foraminal stenosis. 3. Moderate to severe canal stenosis at L3-L4 and moderate canal stenosis at L2-L3. MR THORACIC SPINE IMPRESSION 1. Moderate foraminal stenosis on the right at T10-T11. Otherwise, multilevel mild foraminal stenosis in the thoracic spine. 2. Multilevel degenerative disc disease and mild canal stenosis in the thoracic spine. 3. Potentially mild-to-moderate canal stenosis and at least moderate foraminal stenosis C6-C7 and C7-T1, partially only imaged on sagittal and incompletely assessed. Dedicated MRI of the cervical spine could further characterize if clinically indicated. 4. Moderate left greater than right pleural effusions.  Consider dedicated chest imaging. Electronically Signed   By: Margaretha Sheffield M.D.   On: 04/20/2021 07:46   MR LUMBAR SPINE WO CONTRAST  Result Date: 04/20/2021 CLINICAL DATA:  Ataxia, nontraumatic, T-spine pathology suspected; Low back pain, cauda equina syndrome suspected EXAM: MRI THORACIC AND LUMBAR SPINE WITHOUT CONTRAST TECHNIQUE: Multiplanar and multiecho pulse sequences of the thoracic and lumbar spine were obtained without intravenous contrast. COMPARISON:  CT lumbar spine 02/22/2021. FINDINGS: MRI THORACIC SPINE FINDINGS Alignment:  Slight anterolisthesis of T1 on T2 and T2 on T3. Vertebrae: Vertebral body heights are maintained.Multilevel degenerative/discogenic endplate signal changes, greatest at T9-T10 where there is disc height loss. Otherwise, no focal marrow edema to suggest acute fracture or discitis/osteomyelitis. Heterogeneous bone marrow without suspicious bone lesion. Cord:  Normal cord signal. Paraspinal and other soft tissues: Moderate left greater than right pleural effusions. Disc levels: Disc bulges and ligamentum flavum thickening at multiple levels throughout the imaged lower cervical and thoracic spine. Although partially imaged on sagittal imaging, probably mild to moderate canal stenosis and potentially at least moderate foraminal stenosis at C6-C7 and C7-T1. Mild canal stenosis at T5-T6, T7-T8, and T10-T11. Multilevel facet arthropathy. Foraminal stenosis is greatest and moderate on the right at T10-T11. Otherwise, multilevel mild foraminal stenosis in the thoracic spine. MRI LUMBAR SPINE FINDINGS Segmentation:  Standard Alignment: Similar grade 1 anterolisthesis of L2 on L3, L3 on L4, and L4 on L5. Vertebrae: Redemonstrated L3 and L4 compression fractures with similar height loss (35% L3 and 50% at L4). Edema within the superior endplates at these levels suggests that the fractures are unhealed. Edema within the L4 posterior elements. No substantial retropulsion. Conus  medullaris and cauda equina: Conus extends to the T12-L1 level. Conus appears normal. Paraspinal and other soft tissues: Paraspinal muscular atrophy. Disc levels: T12-L1: No significant disc protrusion, foraminal stenosis, or canal stenosis. L1-L2: No significant disc protrusion, foraminal stenosis, or canal stenosis. L2-L3: Grade 1 anterolisthesis. Left eccentric disc bulge, bulky ligamentum flavum thickening and mild facet arthropathy. Prominent dorsal epidural fat. Resulting moderate canal stenosis without significant foraminal stenosis. L3-L4: Grade 1 anterolisthesis. Left eccentric disc bulge with bulky ligamentum flavum thickening and moderate bilateral facet arthropathy. Prominent dorsal epidural fat. Resulting moderate to severe canal stenosis without significant foraminal stenosis. L4-L5: Grade 1 anterolisthesis. Uncovering the disc with uncovering the disc and superimposed broad disc bulge. Severe bilateral facet arthropathy with bulky ligamentum flavum thickening. Resulting severe canal stenosis. Severe right and moderate left foraminal stenosis. L5-S1: Mild broad disc bulge and endplate spurring with mild-to-moderate bilateral facet arthropathy. Resulting mild bilateral foraminal stenosis and mild bilateral subarticular recess  stenosis without significant canal stenosis. IMPRESSION: MR LUMBAR SPINE IMPRESSION 1. Unhealed L3 and L4 compression fractures with similar height loss and marrow edema along the superior endplates. Edema within the L4 posterior elements could be posttraumatic or degenerative in etiology. 2. At L4-L5, severe canal stenosis with severe right and moderate left foraminal stenosis. 3. Moderate to severe canal stenosis at L3-L4 and moderate canal stenosis at L2-L3. MR THORACIC SPINE IMPRESSION 1. Moderate foraminal stenosis on the right at T10-T11. Otherwise, multilevel mild foraminal stenosis in the thoracic spine. 2. Multilevel degenerative disc disease and mild canal stenosis in the  thoracic spine. 3. Potentially mild-to-moderate canal stenosis and at least moderate foraminal stenosis C6-C7 and C7-T1, partially only imaged on sagittal and incompletely assessed. Dedicated MRI of the cervical spine could further characterize if clinically indicated. 4. Moderate left greater than right pleural effusions. Consider dedicated chest imaging. Electronically Signed   By: Feliberto Harts M.D.   On: 04/20/2021 07:46    04/02/2021 lower extremity Doppler study.    Indications: Peripheral artery disease.  High Risk Factors: Hypertension.  Comparison Study: No prior studies.  Performing Technologist: Olen Cordial RVT  Examination Guidelines: A complete evaluation includes at minimum, Doppler waveform signals and systolic blood pressure reading at the level of bilateral brachial, anterior tibial, and posterior tibial arteries, when vessel segments are accessible. Bilateral testing is considered an integral part of a complete examination. Photoelectric Plethysmograph (PPG) waveforms and toe systolic pressure readings are included as required and additional duplex testing as needed. Limited examinations for reoccurring indications may be performed as noted.   ABI Findings:  +---------+------------------+-----+---------+--------+  Right    Rt Pressure (mmHg)IndexWaveform Comment   +---------+------------------+-----+---------+--------+  Brachial 135                    triphasic          +---------+------------------+-----+---------+--------+  PTA      122               0.90 biphasic           +---------+------------------+-----+---------+--------+  DP       116               0.86 biphasic           +---------+------------------+-----+---------+--------+  Great Toe47                0.35                    +---------+------------------+-----+---------+--------+   +---------+------------------+-----+---------+-------+  Left     Lt Pressure  (mmHg)IndexWaveform Comment  +---------+------------------+-----+---------+-------+  Brachial 130                    triphasic         +---------+------------------+-----+---------+-------+  PTA      162               1.20 biphasic          +---------+------------------+-----+---------+-------+  DP       165               1.22 triphasic         +---------+------------------+-----+---------+-------+  Great Toe59                0.44                   +---------+------------------+-----+---------+-------+   +-------+-----------+-----------+------------+------------+  ABI/TBIToday's ABIToday's TBIPrevious ABIPrevious TBI  +-------+-----------+-----------+------------+------------+  Right  0.9        0.35                                 +-------+-----------+-----------+------------+------------+  Left   1.22       0.44                                 +-------+-----------+-----------+------------+------------+    Summary:  Right: Resting right ankle-brachial index indicates mild right lower  extremity arterial disease. The right toe-brachial index is abnormal.   Left: Resting left ankle-brachial index is within normal range. No  evidence of significant left lower extremity arterial disease. The left  toe-brachial index is abnormal.   EKG: Independently reviewed.  Vent. rate 97 BPM PR interval 142 ms QRS duration 60 ms QT/QTcB 413/525 ms P-R-T axes 53 13 178 Sinus tachycardia Multiple ventricular premature complexes Low voltage, extremity leads Anteroseptal infarct, old Nonspecific T abnormalities, lateral leads Prolonged QT interval  Assessment/Plan Principal Problem:   Falls frequently   Weakness of left lower extremity No back pain at this time. Observation/telemetry. Fall precautions. Transfer to [facility] (neurosurgery availability). Consult neurology. May consult neurosurgery pending neurology consult. Will need TOC and likely  physical therapy evaluation.  Active Problems:   Hypokalemia Seen on i-STAT results. Low normal on CMP. Continue potassium supplementation. So [place] admitted a couple of weeks for gastritis    Prolonged QT interval   History of multifocal atrial tachycardia. Usually has palpitations twice a week. Continue KCl supplement. Keep electrolytes optimized. Avoid QT prolonging meds.    Gastritis Continue PPI. Antiemetics before meals.    Skin rash Continue hydroxyzine as needed for pruritus.    Essential hypertension, benign Currently not on antihypertensive. Monitor blood pressure and heart rate.    Depression Continue sertraline 25 mg p.o. daily.    Protein-calorie malnutrition, severe Continue Ensure liquid protein supplement. Consider nutritional services evaluation    Asymptomatic bacteriuria Has had catheter in place for 2 weeks due to urinary retention. No urinary symptoms, flank pain, fever, chills or leukocytosis. Will defer further use of ceftriaxone unless symptoms appear.  DVT prophylaxis: SCDs. Code Status: Full code. Family Communication:   Disposition Plan:   Patient is from: Home.  Anticipated DC to: TBD.  Anticipated DC date: 04/21/2021 or 04/22/2021.  Anticipated DC barriers: Clinical status.  Consults called:  Neurology Kerney Elbe, MD). Admission status:  Observation/telemetry.   Severity of Illness: High severity after presenting with worsening lower extremity weakness, history of urinary retention and decreased rectal tone in the setting of L3-L4 compression fractures along with multiple other spine findings on MRI imaging.  Reubin Milan MD [facility]  How to contact the [facility] Attending or Consulting provider [place] or covering provider during after hours [place], for this patient?   Check the care team in [facility] and look for a) attending/consulting [facility] provider listed and b) the [facility] team listed Log into [email or web address]  and use [place]'s universal password to access. If you do not have the password, please contact the hospital operator. Locate the [facility] provider you are looking for under [facility] and page to a number that you can be directly reached. If you still have difficulty reaching the provider, please page the [facility] (Director on Call) for the Hospitalists listed on amion for assistance.  04/20/2021, 10:28 AM  This document was prepared using Dragon voice recognition software and may contain some unintended transcription errors.

## 2021-04-20 NOTE — ED Provider Notes (Signed)
Care assumed from [place], PA-C at shift change pending MRI images.  See his note for full HPI.  In short, patient is a 70 year old female who presents to the ED due to left lower extremity weakness.  Patient admits to bilateral lower extremity weakness; however left greater than right. Patient recently discharged from the hospital and has not been able to move her left leg normally.  Patient endorses difficulties ambulating.  She also endorses associated numbness/tingling.  Patient has a history of a previous lumbar compression fracture.  She was also diagnosed with acute urinary retention during her last hospitalization of unknown etiology.  Previous provider spoke to Dr. Theda Sers with neurology who recommends MRI brain, T/L-spine.  Will reconsult if images are abnormal.  Patient also found to have a UTI which she was treated with IV Rocephin by previous provider.  Anticipate admission given inability to ambulate.   ED Course/Procedures    Results for orders placed or performed during the hospital encounter of 04/19/21 (from the past 24 hour(s))  Resp Panel by RT-PCR (Flu A&B, Covid) Nasopharyngeal Swab     Status: None   Collection Time: 04/19/21 10:51 PM   Specimen: Nasopharyngeal Swab; Nasopharyngeal(NP) swabs in vial transport medium  Result Value Ref Range   SARS Coronavirus 2 by RT PCR NEGATIVE NEGATIVE   Influenza A by PCR NEGATIVE NEGATIVE   Influenza B by PCR NEGATIVE NEGATIVE  Urine rapid drug screen (hosp performed)     Status: Abnormal   Collection Time: 04/19/21 10:51 PM  Result Value Ref Range   Opiates POSITIVE (A) NONE DETECTED   Cocaine NONE DETECTED NONE DETECTED   Benzodiazepines NONE DETECTED NONE DETECTED   Amphetamines NONE DETECTED NONE DETECTED   Tetrahydrocannabinol NONE DETECTED NONE DETECTED   Barbiturates NONE DETECTED NONE DETECTED  Urinalysis, Routine w reflex microscopic     Status: Abnormal   Collection Time: 04/19/21 10:51 PM  Result Value Ref Range    Color, Urine AMBER (A) YELLOW   APPearance TURBID (A) CLEAR   Specific Gravity, Urine 1.025 1.005 - 1.030   pH 6.0 5.0 - 8.0   Glucose, UA 100 (A) NEGATIVE mg/dL   Hgb urine dipstick LARGE (A) NEGATIVE   Bilirubin Urine MODERATE (A) NEGATIVE   Ketones, ur NEGATIVE NEGATIVE mg/dL   Protein, ur 30 (A) NEGATIVE mg/dL   Nitrite POSITIVE (A) NEGATIVE   Leukocytes,Ua MODERATE (A) NEGATIVE  Urinalysis, Microscopic (reflex)     Status: Abnormal   Collection Time: 04/19/21 10:51 PM  Result Value Ref Range   RBC / HPF 6-10 0 - 5 RBC/hpf   WBC, UA >50 0 - 5 WBC/hpf   Bacteria, UA MANY (A) NONE SEEN   Squamous Epithelial / LPF 0-5 0 - 5   Mucus PRESENT    Ca Oxalate Crys, UA PRESENT   CBC     Status: None   Collection Time: 04/20/21  1:00 AM  Result Value Ref Range   WBC 7.2 4.0 - 10.5 K/uL   RBC 4.01 3.87 - 5.11 MIL/uL   Hemoglobin 13.6 12.0 - 15.0 g/dL   HCT 38.2 36.0 - 46.0 %   MCV 95.3 80.0 - 100.0 fL   MCH 33.9 26.0 - 34.0 pg   MCHC 35.6 30.0 - 36.0 g/dL   RDW 15.4 11.5 - 15.5 %   Platelets 260 150 - 400 K/uL   nRBC 0.0 0.0 - 0.2 %  Differential     Status: None   Collection Time: 04/20/21  1:00  AM  Result Value Ref Range   Neutrophils Relative % 76 %   Neutro Abs 5.4 1.7 - 7.7 K/uL   Lymphocytes Relative 13 %   Lymphs Abs 1.0 0.7 - 4.0 K/uL   Monocytes Relative 8 %   Monocytes Absolute 0.6 0.1 - 1.0 K/uL   Eosinophils Relative 2 %   Eosinophils Absolute 0.2 0.0 - 0.5 K/uL   Basophils Relative 1 %   Basophils Absolute 0.1 0.0 - 0.1 K/uL   Immature Granulocytes 0 %   Abs Immature Granulocytes 0.03 0.00 - 0.07 K/uL  I-stat chem 8, ED     Status: Abnormal   Collection Time: 04/20/21  1:11 AM  Result Value Ref Range   Sodium 138 135 - 145 mmol/L   Potassium 3.1 (L) 3.5 - 5.1 mmol/L   Chloride 101 98 - 111 mmol/L   BUN 9 8 - 23 mg/dL   Creatinine, Ser 0.50 0.44 - 1.00 mg/dL   Glucose, Bld 76 70 - 99 mg/dL   Calcium, Ion 0.97 (L) 1.15 - 1.40 mmol/L   TCO2 27 22 - 32  mmol/L   Hemoglobin 15.3 (H) 12.0 - 15.0 g/dL   HCT 45.0 36.0 - 46.0 %  Ethanol     Status: None   Collection Time: 04/20/21  1:20 AM  Result Value Ref Range   Alcohol, Ethyl (B) <10 <10 mg/dL  Comprehensive metabolic panel     Status: Abnormal   Collection Time: 04/20/21  1:20 AM  Result Value Ref Range   Sodium 139 135 - 145 mmol/L   Potassium 3.5 3.5 - 5.1 mmol/L   Chloride 103 98 - 111 mmol/L   CO2 25 22 - 32 mmol/L   Glucose, Bld 79 70 - 99 mg/dL   BUN 10 8 - 23 mg/dL   Creatinine, Ser 0.71 0.44 - 1.00 mg/dL   Calcium 7.7 (L) 8.9 - 10.3 mg/dL   Total Protein 5.3 (L) 6.5 - 8.1 g/dL   Albumin 1.8 (L) 3.5 - 5.0 g/dL   AST 44 (H) 15 - 41 U/L   ALT 26 0 - 44 U/L   Alkaline Phosphatase 167 (H) 38 - 126 U/L   Total Bilirubin 1.3 (H) 0.3 - 1.2 mg/dL   GFR, Estimated >60 >60 mL/min   Anion gap 11 5 - 15  Protime-INR     Status: None   Collection Time: 04/20/21  1:20 AM  Result Value Ref Range   Prothrombin Time 13.6 11.4 - 15.2 seconds   INR 1.0 0.8 - 1.2  APTT     Status: None   Collection Time: 04/20/21  1:20 AM  Result Value Ref Range   aPTT 29 24 - 36 seconds    Procedures  MDM  70 year old female presents to the ED due to lower extremity weakness and paraesthesias (left > right).  Patient has a history of lumbar compression fractures and was diagnosed with acute urinary retention during her recent hospitalization of unknown etiology.  Previous provider spoke with Dr. Theda Sers with neurology who rec and MRI brain, T/L-spine.  Patient also found to have a UTI was treated with IV Rocephin.  MRI of the brain personally reviewed which is negative for any acute abnormalities.  No acute CVA. MRI thoracic/lumbar spine personally reviewed which demonstrates: IMPRESSION:  MR LUMBAR SPINE IMPRESSION     1. Unhealed L3 and L4 compression fractures with similar height loss  and marrow edema along the superior endplates. Edema within the L4  posterior elements  could be posttraumatic  or degenerative in  etiology.  2. At L4-L5, severe canal stenosis with severe right and moderate  left foraminal stenosis.  3. Moderate to severe canal stenosis at L3-L4 and moderate canal  stenosis at L2-L3.     MR THORACIC SPINE IMPRESSION     1. Moderate foraminal stenosis on the right at T10-T11. Otherwise,  multilevel mild foraminal stenosis in the thoracic spine.  2. Multilevel degenerative disc disease and mild canal stenosis in  the thoracic spine.  3. Potentially mild-to-moderate canal stenosis and at least moderate  foraminal stenosis C6-C7 and C7-T1, partially only imaged on  sagittal and incompletely assessed. Dedicated MRI of the cervical  spine could further characterize if clinically indicated.  4. Moderate left greater than right pleural effusions. Consider  dedicated chest imaging.   8:25 AM rectal exam performed with chaperone at bedside. Very minimal if any rectal tone. Given decreased rectal tone and urinary retention will discuss with neurosurgery.   Upon reassessment, right foot appears cooler than left. Bedside doppler confirms pedal pulses bilaterally. Patient endorses vascular issues.  9:11 AM Spoke with Dr. Marcello Moores with neurosurgery who reviewed images. No emergent need for surgery. He feels surgery may be needed in the future, but not emergently.  9:25 AM Discussed with Dr. Olevia Bowens with [facility] who agrees to admit patient for further evaluation. COVID negative.     Karie Kirks 04/20/21 [id number]    Luna Fuse, MD 04/20/21 [phone]

## 2021-04-20 NOTE — Progress Notes (Signed)
Neurosurgery  I reviewed the MRI L-spine on this patient.  She has chronic degenerative stenosis which may require surgical attention in the future but would not be causing any acute change in neurologic function.  Additionally, as she has healing compression fractures in her lumbar spine, these would need to be fully healed prior to surgery.  In the meantime, she may benefit from an LSO brace, and possibly dexamethasone taper for any radiculopathic pain.

## 2021-04-20 NOTE — Consult Note (Addendum)
NEURO HOSPITALIST CONSULT NOTE   Requesting physician: Dr. Olevia Bowens  Reason for Consult: Left > right lower extremity weakness  History obtained from:   Patient and Chart    HPI:                                                                                                                                          Debbie Barry is a 70 y.o. female with a PMHx of fatty liver, GERD, HTN, PVD, right 3rd toe osteomyelitis and lumbar compression fracture, who re-presented to the ED on Thursday night with a c/c of LLE > RLE weakness ever since discharge from the hospital on 10/5. She had been admitted and managed at that time for a 3 week history of intermittent N/V and loose stools. In the ED last night, she stated that since being discharged from the hospital 2 weeks ago she has not been able to move her left leg normally; she has had to drag it behind her when ambulating. The weakness has been progressive. She reports that her left lower extremity becomes numb causing her to fall because she is unaware that she is dragging her left leg with walking.  She also has had urinary retention for 2 weeks and has not had a bowel movement in a week and notes a 50 pound weight loss since February. She states that she has not been able to keep food down for over one month due to vomiting. Of note, she was diagnosed with urinary retention during her last hospitalization, which was of unknown etiology. Decreased rectal tone was noted on EDP exam, described as "Very minimal if any rectal tone" per their note. Neurohospitalist was called overnight regarding the patient. MRI of brain, thoracic and lumbar spine was recommended. Neurosurgery reviewed the images and felt that surgery might be indicated, but not emergently. Although bedside doppler confirmed pedal pulses bilaterally, her right foot felt cooler than the left on EDP exam. The patient has noticed a skin ulcer on her left leg.  She denies any  other associated symptoms.    Neurosurgery note reviewed: "I reviewed the MRI L-spine on this patient.  She has chronic degenerative stenosis which may require surgical attention in the future but would not be causing any acute change in neurologic function.  Additionally, as she has healing compression fractures in her lumbar spine, these would need to be fully healed prior to surgery.  In the meantime, she may benefit from an LSO brace, and possibly dexamethasone taper for any radiculopathic pain".   MRI brain:  - No evidence of acute intracranial abnormality. - Mild chronic microvascular disease and atrophy.  MRI T-spine: 1. Moderate foraminal stenosis on the right at T10-T11. Otherwise, multilevel mild foraminal stenosis in the thoracic spine. 2.  Multilevel degenerative disc disease and mild canal stenosis in the thoracic spine. 3. Potentially mild-to-moderate canal stenosis and at least moderate foraminal stenosis C6-C7 and C7-T1, partially only imaged on sagittal and incompletely assessed. Dedicated MRI of the cervical spine could further characterize if clinically indicated. 4. Moderate left greater than right pleural effusions. Consider dedicated chest imaging.  MRI L-spine: 1. Unhealed L3 and L4 compression fractures with similar height loss and marrow edema along the superior endplates. Edema within the L4 posterior elements could be posttraumatic or degenerative in etiology. 2. At L4-L5, severe canal stenosis with severe right and moderate left foraminal stenosis. 3. Moderate to severe canal stenosis at L3-L4 and moderate canal stenosis at L2-L3.  Past Medical History:  Diagnosis Date   Fatty liver    GERD (gastroesophageal reflux disease)    Hypertension    Osteomyelitis ([place])    right third toe   Peripheral vascular disease ([place])    Ulcer 08/14/20   Past Surgical History:  Procedure Laterality Date   AMPUTATION Right 08/08/2017   Procedure: AMPUTATION RIGHT 3RD  TOE;  Surgeon: Newt Minion, MD;  Location: [facility];  Service: Orthopedics;  Laterality: Right;   BIOPSY  08/14/2020   Procedure: BIOPSY;  Surgeon: Clarene Essex, MD;  Location: [facility];  Service: Endoscopy;;   Bunionectomy Right 2006     Right   Bunionectony Left 2006      August   COLONOSCOPY N/A 01/21/2014   Procedure: COLONOSCOPY;  Surgeon: Beryle Beams, MD;  Location: [facility];  Service: Endoscopy;  Laterality: N/A;   ESOPHAGOGASTRODUODENOSCOPY (EGD) WITH PROPOFOL N/A 08/14/2020   Procedure: ESOPHAGOGASTRODUODENOSCOPY (EGD) WITH PROPOFOL;  Surgeon: Clarene Essex, MD;  Location: [facility];  Service: Endoscopy;  Laterality: N/A;   TUBAL LIGATION     WISDOM TOOTH EXTRACTION     Family History  Problem Relation Age of Onset   Breast cancer Mother    Colon cancer Mother    Heart Problems Mother    Hypertension Mother    Multiple myeloma Mother    Arthritis Mother    Cancer Mother    Heart disease Mother    Hypertension Father    Heart disease Father    Multiple myeloma Father    Arthritis Father    Cancer Father       Social History:  reports that she has never smoked. She has never used smokeless tobacco. She reports current alcohol use of about 5.0 standard drinks per week. She reports that she does not use drugs.  No Known Allergies  MEDICATIONS:                                                                                                                     I have reviewed the patient's current medications. Scheduled:  feeding supplement  237 mL Oral TID BM   hydrOXYzine  50 mg Oral TID   pantoprazole  40 mg Oral Daily   [START ON 04/21/2021] potassium chloride SA  20 mEq  Oral Daily   sertraline  25 mg Oral Daily   Continuous:  0.9 % NaCl with KCl 40 mEq / L     ROS:                                                                                                                                       History obtained from chart review and the  patient  General ROS: negative for - chills, fever, night sweats Psychological ROS: negative for - behavioral disorder, hallucinations, memory difficulties Ophthalmic ROS: negative for - blurry vision, eye pain or loss of vision ENT ROS: negative for - epistaxis, nasal discharge, sore throat, tinnitus or vertigo Allergy and Immunology ROS: negative for - hives or itchy/watery eyes Hematological and Lymphatic ROS: negative for - bleeding problems, bruising or swollen lymph nodes Endocrine ROS: negative for - polydipsia/polyuria or temperature intolerance Respiratory ROS: negative for - cough, hemoptysis, shortness of breath or wheezing Cardiovascular ROS: negative for - chest pain, edema or irregular heartbeat Gastrointestinal ROS: negative for - abdominal pain, hematemesis Genito-Urinary ROS: as noted in HPI Musculoskeletal ROS: negative for - joint swelling  Neurological ROS: as noted in HPI Dermatological ROS: negative for rash and skin lesion changes  Blood pressure 127/82, pulse 97, temperature 98.1 F (36.7 C), temperature source Rectal, resp. rate 14, height _0  (1.549 m), weight 53.1 kg, SpO2 98 %.  General Examination:                                                                                                       Physical Exam  Head- Normocephalic, no lesions, without obvious abnormality.  EENT-  Normal external eye and conjunctiva.  She is edentulous Cardiovascular- Extremities cool, no pedal edema noted Lungs-no rhonchi or wheezing noted, no excessive working breathing.  Saturations within normal limits Abdomen- All 4 quadrants palpated and nontender Extremities- Cool, dry, and intact Musculoskeletal- no joint tenderness, deformity or swelling Skin- dry and intact, no hyperpigmentation, vitiligo, or suspicious lesions  Neurological Examination Mental Status: Alert, oriented, thought content appropriate.   Speech fluent without evidence of aphasia.   Able to  follow 3 step commands requiring some repeat instruction.  She is able to provide a clear and coherent history of present illness.  Cranial Nerves: II: Visual fields grossly normal,  III,IV, VI: ptosis not present, extra-ocular motions intact bilaterally pupils equal, round, reactive to light  V,VII: smile symmetric, facial light touch sensation normal bilaterally VIII: hearing normal bilaterally IX,X:  palate rises symmetrically, phonation intact XI: bilateral shoulder shrug XII: midline tongue extension Motor: Right : Upper extremity   5/5    Left:     Upper extremity   5/5  Lower extremity   4/5 proximal and distal  Lower extremity   3/5 proximally and distally Ankle plantar/dorsiflexion: 4-/5 right, 3/5 on the left  Significantly decreased muscle bulk in shoulders bilaterally Sensory: Decreased sensation to light touch on the left lower extremity distal to the knee compared to the right. Sensation to light touch intact and symmetric on bilateral upper extremities. Deep Tendon Reflexes: 2+ and symmetric biceps, 2+ left patellae, 1+ right patellae Plantars: Right: downgoing   Left: downgoing Cerebellar: normal finger-to-nose, unable to assess heel-knee-shin due to BLE weakness Gait: deferred for patient safety due to frequent falls   Lab Results: Basic Metabolic Panel: Recent Labs  Lab 04/20/21 0111 04/20/21 0120  NA 138 139  K 3.1* 3.5  CL 101 103  CO2  --  25  GLUCOSE 76 79  BUN 9 10  CREATININE 0.50 0.71  CALCIUM  --  7.7*   CBC: Recent Labs  Lab 04/20/21 0100 04/20/21 0111  WBC 7.2  --   NEUTROABS 5.4  --   HGB 13.6 15.3*  HCT 38.2 45.0  MCV 95.3  --   PLT 260  --    Cardiac Enzymes: No results for input(s): CKTOTAL, CKMB, CKMBINDEX, TROPONINI in the last 168 hours.  Lipid Panel: No results for input(s): CHOL, TRIG, HDL, CHOLHDL, VLDL, LDLCALC in the last 168 hours.  Lab Results  Component Value Date   VITAMINB12 >2000 (H) 02/01/2021   Imaging: CT  HEAD WO CONTRAST (5MM)  Result Date: 04/19/2021 CLINICAL DATA:  Neuro deficit, acute, stroke suspected EXAM: CT HEAD WITHOUT CONTRAST TECHNIQUE: Contiguous axial images were obtained from the base of the skull through the vertex without intravenous contrast. COMPARISON:  02/22/2021 FINDINGS: Brain: There is atrophy and chronic small vessel disease changes. No acute intracranial abnormality. Specifically, no hemorrhage, hydrocephalus, mass lesion, acute infarction, or significant intracranial injury. Vascular: No hyperdense vessel or unexpected calcification. Skull: No acute calvarial abnormality. Sinuses/Orbits: No acute findings Other: None IMPRESSION: Atrophy, chronic microvascular disease. No acute intracranial abnormality. Electronically Signed   By: Rolm Baptise M.D.   On: 04/19/2021 22:52   MR BRAIN WO CONTRAST  Result Date: 04/20/2021 CLINICAL DATA:  Neuro deficit, acute, stroke suspected EXAM: MRI HEAD WITHOUT CONTRAST TECHNIQUE: Multiplanar, multiecho pulse sequences of the brain and surrounding structures were obtained without intravenous contrast. COMPARISON:  CT head10/20/2022. FINDINGS: Brain: No acute infarction, hemorrhage, hydrocephalus, extra-axial collection or mass lesion. Mild scattered T2 hyperintensities in the white matter, nonspecific but compatible with chronic microvascular disease. Mild atrophy. Vascular: Major arterial flow voids are maintained at the skull base. Skull and upper cervical spine: Normal marrow signal. Sinuses/Orbits: Clear sinuses.  Unremarkable orbits Other: Small right and trace left mastoid effusions. IMPRESSION: 1. No evidence of acute intracranial abnormality. 2. Mild chronic microvascular disease and atrophy. Electronically Signed   By: Margaretha Sheffield M.D.   On: 04/20/2021 06:55   MR THORACIC SPINE WO CONTRAST  Result Date: 04/20/2021 CLINICAL DATA:  Ataxia, nontraumatic, T-spine pathology suspected; Low back pain, cauda equina syndrome suspected EXAM:  MRI THORACIC AND LUMBAR SPINE WITHOUT CONTRAST TECHNIQUE: Multiplanar and multiecho pulse sequences of the thoracic and lumbar spine were obtained without intravenous contrast. COMPARISON:  CT lumbar spine 02/22/2021. FINDINGS: MRI THORACIC SPINE FINDINGS Alignment:  Slight anterolisthesis of T1 on T2  and T2 on T3. Vertebrae: Vertebral body heights are maintained.Multilevel degenerative/discogenic endplate signal changes, greatest at T9-T10 where there is disc height loss. Otherwise, no focal marrow edema to suggest acute fracture or discitis/osteomyelitis. Heterogeneous bone marrow without suspicious bone lesion. Cord:  Normal cord signal. Paraspinal and other soft tissues: Moderate left greater than right pleural effusions. Disc levels: Disc bulges and ligamentum flavum thickening at multiple levels throughout the imaged lower cervical and thoracic spine. Although partially imaged on sagittal imaging, probably mild to moderate canal stenosis and potentially at least moderate foraminal stenosis at C6-C7 and C7-T1. Mild canal stenosis at T5-T6, T7-T8, and T10-T11. Multilevel facet arthropathy. Foraminal stenosis is greatest and moderate on the right at T10-T11. Otherwise, multilevel mild foraminal stenosis in the thoracic spine. MRI LUMBAR SPINE FINDINGS Segmentation:  Standard Alignment: Similar grade 1 anterolisthesis of L2 on L3, L3 on L4, and L4 on L5. Vertebrae: Redemonstrated L3 and L4 compression fractures with similar height loss (35% L3 and 50% at L4). Edema within the superior endplates at these levels suggests that the fractures are unhealed. Edema within the L4 posterior elements. No substantial retropulsion. Conus medullaris and cauda equina: Conus extends to the T12-L1 level. Conus appears normal. Paraspinal and other soft tissues: Paraspinal muscular atrophy. Disc levels: T12-L1: No significant disc protrusion, foraminal stenosis, or canal stenosis. L1-L2: No significant disc protrusion, foraminal  stenosis, or canal stenosis. L2-L3: Grade 1 anterolisthesis. Left eccentric disc bulge, bulky ligamentum flavum thickening and mild facet arthropathy. Prominent dorsal epidural fat. Resulting moderate canal stenosis without significant foraminal stenosis. L3-L4: Grade 1 anterolisthesis. Left eccentric disc bulge with bulky ligamentum flavum thickening and moderate bilateral facet arthropathy. Prominent dorsal epidural fat. Resulting moderate to severe canal stenosis without significant foraminal stenosis. L4-L5: Grade 1 anterolisthesis. Uncovering the disc with uncovering the disc and superimposed broad disc bulge. Severe bilateral facet arthropathy with bulky ligamentum flavum thickening. Resulting severe canal stenosis. Severe right and moderate left foraminal stenosis. L5-S1: Mild broad disc bulge and endplate spurring with mild-to-moderate bilateral facet arthropathy. Resulting mild bilateral foraminal stenosis and mild bilateral subarticular recess stenosis without significant canal stenosis. IMPRESSION: MR LUMBAR SPINE IMPRESSION 1. Unhealed L3 and L4 compression fractures with similar height loss and marrow edema along the superior endplates. Edema within the L4 posterior elements could be posttraumatic or degenerative in etiology. 2. At L4-L5, severe canal stenosis with severe right and moderate left foraminal stenosis. 3. Moderate to severe canal stenosis at L3-L4 and moderate canal stenosis at L2-L3. MR THORACIC SPINE IMPRESSION 1. Moderate foraminal stenosis on the right at T10-T11. Otherwise, multilevel mild foraminal stenosis in the thoracic spine. 2. Multilevel degenerative disc disease and mild canal stenosis in the thoracic spine. 3. Potentially mild-to-moderate canal stenosis and at least moderate foraminal stenosis C6-C7 and C7-T1, partially only imaged on sagittal and incompletely assessed. Dedicated MRI of the cervical spine could further characterize if clinically indicated. 4. Moderate left  greater than right pleural effusions. Consider dedicated chest imaging. Electronically Signed   By: Margaretha Sheffield M.D.   On: 04/20/2021 07:46   MR LUMBAR SPINE WO CONTRAST  Result Date: 04/20/2021 CLINICAL DATA:  Ataxia, nontraumatic, T-spine pathology suspected; Low back pain, cauda equina syndrome suspected EXAM: MRI THORACIC AND LUMBAR SPINE WITHOUT CONTRAST TECHNIQUE: Multiplanar and multiecho pulse sequences of the thoracic and lumbar spine were obtained without intravenous contrast. COMPARISON:  CT lumbar spine 02/22/2021. FINDINGS: MRI THORACIC SPINE FINDINGS Alignment:  Slight anterolisthesis of T1 on T2 and T2 on T3. Vertebrae: Vertebral body  heights are maintained.Multilevel degenerative/discogenic endplate signal changes, greatest at T9-T10 where there is disc height loss. Otherwise, no focal marrow edema to suggest acute fracture or discitis/osteomyelitis. Heterogeneous bone marrow without suspicious bone lesion. Cord:  Normal cord signal. Paraspinal and other soft tissues: Moderate left greater than right pleural effusions. Disc levels: Disc bulges and ligamentum flavum thickening at multiple levels throughout the imaged lower cervical and thoracic spine. Although partially imaged on sagittal imaging, probably mild to moderate canal stenosis and potentially at least moderate foraminal stenosis at C6-C7 and C7-T1. Mild canal stenosis at T5-T6, T7-T8, and T10-T11. Multilevel facet arthropathy. Foraminal stenosis is greatest and moderate on the right at T10-T11. Otherwise, multilevel mild foraminal stenosis in the thoracic spine. MRI LUMBAR SPINE FINDINGS Segmentation:  Standard Alignment: Similar grade 1 anterolisthesis of L2 on L3, L3 on L4, and L4 on L5. Vertebrae: Redemonstrated L3 and L4 compression fractures with similar height loss (35% L3 and 50% at L4). Edema within the superior endplates at these levels suggests that the fractures are unhealed. Edema within the L4 posterior elements. No  substantial retropulsion. Conus medullaris and cauda equina: Conus extends to the T12-L1 level. Conus appears normal. Paraspinal and other soft tissues: Paraspinal muscular atrophy. Disc levels: T12-L1: No significant disc protrusion, foraminal stenosis, or canal stenosis. L1-L2: No significant disc protrusion, foraminal stenosis, or canal stenosis. L2-L3: Grade 1 anterolisthesis. Left eccentric disc bulge, bulky ligamentum flavum thickening and mild facet arthropathy. Prominent dorsal epidural fat. Resulting moderate canal stenosis without significant foraminal stenosis. L3-L4: Grade 1 anterolisthesis. Left eccentric disc bulge with bulky ligamentum flavum thickening and moderate bilateral facet arthropathy. Prominent dorsal epidural fat. Resulting moderate to severe canal stenosis without significant foraminal stenosis. L4-L5: Grade 1 anterolisthesis. Uncovering the disc with uncovering the disc and superimposed broad disc bulge. Severe bilateral facet arthropathy with bulky ligamentum flavum thickening. Resulting severe canal stenosis. Severe right and moderate left foraminal stenosis. L5-S1: Mild broad disc bulge and endplate spurring with mild-to-moderate bilateral facet arthropathy. Resulting mild bilateral foraminal stenosis and mild bilateral subarticular recess stenosis without significant canal stenosis. IMPRESSION: MR LUMBAR SPINE IMPRESSION 1. Unhealed L3 and L4 compression fractures with similar height loss and marrow edema along the superior endplates. Edema within the L4 posterior elements could be posttraumatic or degenerative in etiology. 2. At L4-L5, severe canal stenosis with severe right and moderate left foraminal stenosis. 3. Moderate to severe canal stenosis at L3-L4 and moderate canal stenosis at L2-L3. MR THORACIC SPINE IMPRESSION 1. Moderate foraminal stenosis on the right at T10-T11. Otherwise, multilevel mild foraminal stenosis in the thoracic spine. 2. Multilevel degenerative disc  disease and mild canal stenosis in the thoracic spine. 3. Potentially mild-to-moderate canal stenosis and at least moderate foraminal stenosis C6-C7 and C7-T1, partially only imaged on sagittal and incompletely assessed. Dedicated MRI of the cervical spine could further characterize if clinically indicated. 4. Moderate left greater than right pleural effusions. Consider dedicated chest imaging. Electronically Signed   By: Margaretha Sheffield M.D.   On: 04/20/2021 07:46    Assessment: 69 year old female re- presenting with severe left > right lower extremity weakness since discharge from the hospital on 10/5.  1. Examination reveals patient with left > right lower extremity weakness and decreased sensation to light touch. Patient states that due to her numbness, she does not feel when she is dragging her left leg with walking and this causes her to fall at home.  2. Imaging reveals chronic degenerative stenosis on the right at T10-T11 with  potentially mild-to-moderate canal stenosis and at least moderate foraminal stenosis at C6-C7 and C7-T1, unhealed L3 and L4 compression fractures with similar height loss and marrow edema along the superior endplates, edema within the L4 posterior elements, and severe canal stenosis with severe right and moderate left foraminal stenosis at L4-L5 with moderate to severe canal stenosis at L3-L4 and moderate canaal stenosis at L2-L3.  3. Attending neurologist reviewed imaging: there is significant atrophy of the patient's paraspinal and gluteal muscles (limited visualization) bilaterally, as well as moderate to severe atrophy of the patient's iliopsoas muscles indicating that there is some deconditioning or chronic myopathic component regarding the patient's weakness, which she states has been ongoing as well as worsening over the past year. There is also severe spinal stenosis at L4-L5 with disc extrusion and compression of the cauda equina as well as compression of the right L4  nerve root. These findings are felt to be consistent with patient's presentation of weakness and sensory deficits. The L4-5 spinal stenosis with compression of the cauda equina also may be contributing to her urinary retention and decreased rectal tone.   Recommendations: - Evaluation by Neurosurgery team for possible surgical management of the patient's severe spinal stenosis with cauda equina compression at the L4-5 level.  - MRI of cervical spine to further evaluate the possible stenosis as noted by Radiology on thoracic MRI study - PT and OT for patient's deconditioning with associated diffuse muscle atrophy as seen on clinical exam as well as MRI.  - May also benefit from outpatient Neurology evaluation for EMG/NCS   Electronically signed: Dr. Kerney Elbe 04/20/2021, 11:05 AM

## 2021-04-20 NOTE — ED Notes (Signed)
Pt is in MRI at this time. Vitals are deferred until pt returns to ED.

## 2021-04-20 NOTE — Progress Notes (Signed)
IV Team consult received. No current IV meds ordered. Message to Cassell Smiles, RN to have phlebotomist/ EMT draw labs. Will await medication orders to place best line for ordered therapy.

## 2021-04-20 NOTE — ED Notes (Signed)
Pt. I-stat Chem 8 results potassium 3.1. Betsey Holiday, MD and RN, Cassell Smiles made aware.

## 2021-04-20 NOTE — ED Notes (Signed)
[facility] notified pt it ready for transport

## 2021-04-21 ENCOUNTER — Observation Stay (HOSPITAL_BASED_OUTPATIENT_CLINIC_OR_DEPARTMENT_OTHER): Payer: Medicare HMO

## 2021-04-21 DIAGNOSIS — R079 Chest pain, unspecified: Secondary | ICD-10-CM | POA: Diagnosis not present

## 2021-04-21 DIAGNOSIS — I739 Peripheral vascular disease, unspecified: Secondary | ICD-10-CM | POA: Diagnosis not present

## 2021-04-21 DIAGNOSIS — E43 Unspecified severe protein-calorie malnutrition: Secondary | ICD-10-CM | POA: Diagnosis not present

## 2021-04-21 DIAGNOSIS — R8271 Bacteriuria: Secondary | ICD-10-CM

## 2021-04-21 DIAGNOSIS — R531 Weakness: Secondary | ICD-10-CM | POA: Diagnosis not present

## 2021-04-21 DIAGNOSIS — I4581 Long QT syndrome: Secondary | ICD-10-CM | POA: Diagnosis not present

## 2021-04-21 DIAGNOSIS — Z20822 Contact with and (suspected) exposure to covid-19: Secondary | ICD-10-CM | POA: Diagnosis not present

## 2021-04-21 DIAGNOSIS — I10 Essential (primary) hypertension: Secondary | ICD-10-CM | POA: Diagnosis not present

## 2021-04-21 DIAGNOSIS — E876 Hypokalemia: Secondary | ICD-10-CM | POA: Diagnosis not present

## 2021-04-21 DIAGNOSIS — R9431 Abnormal electrocardiogram [ECG] [EKG]: Secondary | ICD-10-CM | POA: Diagnosis not present

## 2021-04-21 DIAGNOSIS — R296 Repeated falls: Secondary | ICD-10-CM | POA: Diagnosis not present

## 2021-04-21 DIAGNOSIS — Z79899 Other long term (current) drug therapy: Secondary | ICD-10-CM | POA: Diagnosis not present

## 2021-04-21 DIAGNOSIS — R29898 Other symptoms and signs involving the musculoskeletal system: Secondary | ICD-10-CM | POA: Diagnosis not present

## 2021-04-21 LAB — ECHOCARDIOGRAM COMPLETE
AR max vel: 2.47 cm2
AV Area VTI: 2.19 cm2
AV Area mean vel: 2.7 cm2
AV Mean grad: 2.5 mmHg
AV Peak grad: 5.6 mmHg
Ao pk vel: 1.19 m/s
Calc EF: 36.5 %
Height: 61 in
S' Lateral: 3.2 cm
Single Plane A2C EF: 43.7 %
Single Plane A4C EF: 33.4 %
Weight: 1872 oz

## 2021-04-21 MED ORDER — MAGNESIUM OXIDE -MG SUPPLEMENT 400 (240 MG) MG PO TABS
400.0000 mg | ORAL_TABLET | Freq: Two times a day (BID) | ORAL | Status: AC
  Administered 2021-04-21 (×2): 400 mg via ORAL
  Filled 2021-04-21 (×2): qty 1

## 2021-04-21 MED ORDER — SODIUM CHLORIDE 0.9% FLUSH
10.0000 mL | Freq: Two times a day (BID) | INTRAVENOUS | Status: DC
  Administered 2021-04-21 – 2021-04-25 (×9): 10 mL

## 2021-04-21 MED ORDER — CHLORHEXIDINE GLUCONATE CLOTH 2 % EX PADS
6.0000 | MEDICATED_PAD | Freq: Every day | CUTANEOUS | Status: DC
  Administered 2021-04-22 – 2021-04-25 (×4): 6 via TOPICAL

## 2021-04-21 MED ORDER — POTASSIUM CHLORIDE CRYS ER 20 MEQ PO TBCR
40.0000 meq | EXTENDED_RELEASE_TABLET | Freq: Two times a day (BID) | ORAL | Status: AC
  Administered 2021-04-21 (×2): 40 meq via ORAL
  Filled 2021-04-21 (×2): qty 2

## 2021-04-21 MED ORDER — SODIUM CHLORIDE 0.9% FLUSH: 10.0000 mL | INTRAVENOUS | Status: DC | PRN

## 2021-04-21 NOTE — Progress Notes (Signed)
[facility] PROGRESS NOTE    Progress Note  Debbie Barry  EQA:[id number] DOB: 02-04-1951 DOA: 04/19/2021 PCP: Glendale Chard, MD     Brief Narrative:   Debbie Barry is an 70 y.o. female past medical history significant for fatty liver, GERD, essential hypertension, osteomyelitis of the right toe, peripheral vascular disease, history of upper GI bleed due to peptic ulcers who was recently discharged from the hospital for gastroenteritis, iron overload and maculopapular rash she is coming in today complaining of progressive weakness both lower extremities, falls and numbness.,  She had a Foley catheter placed for urinary retention couple of weeks ago she has not had a bowel movement in the past week, and MRI of the brain was done showed no acute CVA mild chronic microvascular disease MRI of the lumbar spine nonhealed L3-L4 compression fracture, severe canal stenosis at L4-L5 with severe right and moderate foraminal stenosis, MRI of the thoracic spine show multiple mild foraminal stenosis degenerative changes and at least moderate foraminal stenosis C6-C7 and C7-T1, with a moderate left greater than right pleural effusion.  Neurosurgery was curb sided that they relate that her degenerative stenosis would not be causing any of her acute neurological symptoms.  Additionally as she has a healing compression fracture of the lumbar spine these will need to be fully healed prior to surgery.  In the meantime they recommended an LCL brace and dexamethasone taper for radicular pain.    Assessment/Plan:   Principal Problem: Frequent falls with  Weakness of left lower extremities: Neurosurgery recommended a steroid taper. We will get physical therapy evaluated the patient. Neurology was consulted who recommended an MRI of the cervical spine which is pending PT OT for evaluation.  Hypokalemia: Orally now 3.5 we will continue to replete orally recheck in the morning.  Prolonged  QTC/history of multifocal atrial tachycardia: Patient relates palpitation twice try to keep potassium greater than 4 magnesium greater than 2.  History of gastritis without esophagitis: Continue PPI and antiemetics.  Skin rash: Continue hydroxyzine.  Essential hypertension: Currently on no antihypertensive medication, her blood pressure is relatively well controlled follow-up with PCP as an outpatient.  Depression: Continue sertraline.  Severe protein caloric malnutrition: Continue Ensure 3 times daily.  Symptomatic bacteriuria: He has no urinary symptoms flank pain fever or leukocytosis. Has remained afebrile in the leukocytosis follow-up with urology as an outpatient.     DVT prophylaxis: lovenox Family Communication:noen Status is: Observation  The patient remains OBS appropriate and will d/c before 2 midnights.       Code Status:     Code Status Orders  (From admission, onward)           Start     Ordered   04/20/21 0956  Full code  Continuous        04/20/21 0958           Code Status History     Date Active Date Inactive Code Status Order ID Comments User Context   03/31/2021 2100 04/05/2021 0008 Full Code [id number]  Etta Quill, DO ED   08/12/2020 2236 08/15/2020 1635 Full Code [id number]  Dessa Phi, DO ED         IV Access:   Peripheral IV   Procedures and diagnostic studies:   CT HEAD WO CONTRAST (5MM)  Result Date: 04/19/2021 CLINICAL DATA:  Neuro deficit, acute, stroke suspected EXAM: CT HEAD WITHOUT CONTRAST TECHNIQUE: Contiguous axial images were obtained from the base of the skull through the vertex  without intravenous contrast. COMPARISON:  02/22/2021 FINDINGS: Brain: There is atrophy and chronic small vessel disease changes. No acute intracranial abnormality. Specifically, no hemorrhage, hydrocephalus, mass lesion, acute infarction, or significant intracranial injury. Vascular: No hyperdense vessel or unexpected  calcification. Skull: No acute calvarial abnormality. Sinuses/Orbits: No acute findings Other: None IMPRESSION: Atrophy, chronic microvascular disease. No acute intracranial abnormality. Electronically Signed   By: Rolm Baptise M.D.   On: 04/19/2021 22:52   MR BRAIN WO CONTRAST  Result Date: 04/20/2021 CLINICAL DATA:  Neuro deficit, acute, stroke suspected EXAM: MRI HEAD WITHOUT CONTRAST TECHNIQUE: Multiplanar, multiecho pulse sequences of the brain and surrounding structures were obtained without intravenous contrast. COMPARISON:  CT head10/20/2022. FINDINGS: Brain: No acute infarction, hemorrhage, hydrocephalus, extra-axial collection or mass lesion. Mild scattered T2 hyperintensities in the white matter, nonspecific but compatible with chronic microvascular disease. Mild atrophy. Vascular: Major arterial flow voids are maintained at the skull base. Skull and upper cervical spine: Normal marrow signal. Sinuses/Orbits: Clear sinuses.  Unremarkable orbits Other: Small right and trace left mastoid effusions. IMPRESSION: 1. No evidence of acute intracranial abnormality. 2. Mild chronic microvascular disease and atrophy. Electronically Signed   By: Margaretha Sheffield M.D.   On: 04/20/2021 06:55   MR THORACIC SPINE WO CONTRAST  Result Date: 04/20/2021 CLINICAL DATA:  Ataxia, nontraumatic, T-spine pathology suspected; Low back pain, cauda equina syndrome suspected EXAM: MRI THORACIC AND LUMBAR SPINE WITHOUT CONTRAST TECHNIQUE: Multiplanar and multiecho pulse sequences of the thoracic and lumbar spine were obtained without intravenous contrast. COMPARISON:  CT lumbar spine 02/22/2021. FINDINGS: MRI THORACIC SPINE FINDINGS Alignment:  Slight anterolisthesis of T1 on T2 and T2 on T3. Vertebrae: Vertebral body heights are maintained.Multilevel degenerative/discogenic endplate signal changes, greatest at T9-T10 where there is disc height loss. Otherwise, no focal marrow edema to suggest acute fracture or  discitis/osteomyelitis. Heterogeneous bone marrow without suspicious bone lesion. Cord:  Normal cord signal. Paraspinal and other soft tissues: Moderate left greater than right pleural effusions. Disc levels: Disc bulges and ligamentum flavum thickening at multiple levels throughout the imaged lower cervical and thoracic spine. Although partially imaged on sagittal imaging, probably mild to moderate canal stenosis and potentially at least moderate foraminal stenosis at C6-C7 and C7-T1. Mild canal stenosis at T5-T6, T7-T8, and T10-T11. Multilevel facet arthropathy. Foraminal stenosis is greatest and moderate on the right at T10-T11. Otherwise, multilevel mild foraminal stenosis in the thoracic spine. MRI LUMBAR SPINE FINDINGS Segmentation:  Standard Alignment: Similar grade 1 anterolisthesis of L2 on L3, L3 on L4, and L4 on L5. Vertebrae: Redemonstrated L3 and L4 compression fractures with similar height loss (35% L3 and 50% at L4). Edema within the superior endplates at these levels suggests that the fractures are unhealed. Edema within the L4 posterior elements. No substantial retropulsion. Conus medullaris and cauda equina: Conus extends to the T12-L1 level. Conus appears normal. Paraspinal and other soft tissues: Paraspinal muscular atrophy. Disc levels: T12-L1: No significant disc protrusion, foraminal stenosis, or canal stenosis. L1-L2: No significant disc protrusion, foraminal stenosis, or canal stenosis. L2-L3: Grade 1 anterolisthesis. Left eccentric disc bulge, bulky ligamentum flavum thickening and mild facet arthropathy. Prominent dorsal epidural fat. Resulting moderate canal stenosis without significant foraminal stenosis. L3-L4: Grade 1 anterolisthesis. Left eccentric disc bulge with bulky ligamentum flavum thickening and moderate bilateral facet arthropathy. Prominent dorsal epidural fat. Resulting moderate to severe canal stenosis without significant foraminal stenosis. L4-L5: Grade 1 anterolisthesis.  Uncovering the disc with uncovering the disc and superimposed broad disc bulge. Severe bilateral facet arthropathy with  bulky ligamentum flavum thickening. Resulting severe canal stenosis. Severe right and moderate left foraminal stenosis. L5-S1: Mild broad disc bulge and endplate spurring with mild-to-moderate bilateral facet arthropathy. Resulting mild bilateral foraminal stenosis and mild bilateral subarticular recess stenosis without significant canal stenosis. IMPRESSION: MR LUMBAR SPINE IMPRESSION 1. Unhealed L3 and L4 compression fractures with similar height loss and marrow edema along the superior endplates. Edema within the L4 posterior elements could be posttraumatic or degenerative in etiology. 2. At L4-L5, severe canal stenosis with severe right and moderate left foraminal stenosis. 3. Moderate to severe canal stenosis at L3-L4 and moderate canal stenosis at L2-L3. MR THORACIC SPINE IMPRESSION 1. Moderate foraminal stenosis on the right at T10-T11. Otherwise, multilevel mild foraminal stenosis in the thoracic spine. 2. Multilevel degenerative disc disease and mild canal stenosis in the thoracic spine. 3. Potentially mild-to-moderate canal stenosis and at least moderate foraminal stenosis C6-C7 and C7-T1, partially only imaged on sagittal and incompletely assessed. Dedicated MRI of the cervical spine could further characterize if clinically indicated. 4. Moderate left greater than right pleural effusions. Consider dedicated chest imaging. Electronically Signed   By: Margaretha Sheffield M.D.   On: 04/20/2021 07:46   MR LUMBAR SPINE WO CONTRAST  Result Date: 04/20/2021 CLINICAL DATA:  Ataxia, nontraumatic, T-spine pathology suspected; Low back pain, cauda equina syndrome suspected EXAM: MRI THORACIC AND LUMBAR SPINE WITHOUT CONTRAST TECHNIQUE: Multiplanar and multiecho pulse sequences of the thoracic and lumbar spine were obtained without intravenous contrast. COMPARISON:  CT lumbar spine 02/22/2021.  FINDINGS: MRI THORACIC SPINE FINDINGS Alignment:  Slight anterolisthesis of T1 on T2 and T2 on T3. Vertebrae: Vertebral body heights are maintained.Multilevel degenerative/discogenic endplate signal changes, greatest at T9-T10 where there is disc height loss. Otherwise, no focal marrow edema to suggest acute fracture or discitis/osteomyelitis. Heterogeneous bone marrow without suspicious bone lesion. Cord:  Normal cord signal. Paraspinal and other soft tissues: Moderate left greater than right pleural effusions. Disc levels: Disc bulges and ligamentum flavum thickening at multiple levels throughout the imaged lower cervical and thoracic spine. Although partially imaged on sagittal imaging, probably mild to moderate canal stenosis and potentially at least moderate foraminal stenosis at C6-C7 and C7-T1. Mild canal stenosis at T5-T6, T7-T8, and T10-T11. Multilevel facet arthropathy. Foraminal stenosis is greatest and moderate on the right at T10-T11. Otherwise, multilevel mild foraminal stenosis in the thoracic spine. MRI LUMBAR SPINE FINDINGS Segmentation:  Standard Alignment: Similar grade 1 anterolisthesis of L2 on L3, L3 on L4, and L4 on L5. Vertebrae: Redemonstrated L3 and L4 compression fractures with similar height loss (35% L3 and 50% at L4). Edema within the superior endplates at these levels suggests that the fractures are unhealed. Edema within the L4 posterior elements. No substantial retropulsion. Conus medullaris and cauda equina: Conus extends to the T12-L1 level. Conus appears normal. Paraspinal and other soft tissues: Paraspinal muscular atrophy. Disc levels: T12-L1: No significant disc protrusion, foraminal stenosis, or canal stenosis. L1-L2: No significant disc protrusion, foraminal stenosis, or canal stenosis. L2-L3: Grade 1 anterolisthesis. Left eccentric disc bulge, bulky ligamentum flavum thickening and mild facet arthropathy. Prominent dorsal epidural fat. Resulting moderate canal stenosis  without significant foraminal stenosis. L3-L4: Grade 1 anterolisthesis. Left eccentric disc bulge with bulky ligamentum flavum thickening and moderate bilateral facet arthropathy. Prominent dorsal epidural fat. Resulting moderate to severe canal stenosis without significant foraminal stenosis. L4-L5: Grade 1 anterolisthesis. Uncovering the disc with uncovering the disc and superimposed broad disc bulge. Severe bilateral facet arthropathy with bulky ligamentum flavum thickening. Resulting severe canal  stenosis. Severe right and moderate left foraminal stenosis. L5-S1: Mild broad disc bulge and endplate spurring with mild-to-moderate bilateral facet arthropathy. Resulting mild bilateral foraminal stenosis and mild bilateral subarticular recess stenosis without significant canal stenosis. IMPRESSION: MR LUMBAR SPINE IMPRESSION 1. Unhealed L3 and L4 compression fractures with similar height loss and marrow edema along the superior endplates. Edema within the L4 posterior elements could be posttraumatic or degenerative in etiology. 2. At L4-L5, severe canal stenosis with severe right and moderate left foraminal stenosis. 3. Moderate to severe canal stenosis at L3-L4 and moderate canal stenosis at L2-L3. MR THORACIC SPINE IMPRESSION 1. Moderate foraminal stenosis on the right at T10-T11. Otherwise, multilevel mild foraminal stenosis in the thoracic spine. 2. Multilevel degenerative disc disease and mild canal stenosis in the thoracic spine. 3. Potentially mild-to-moderate canal stenosis and at least moderate foraminal stenosis C6-C7 and C7-T1, partially only imaged on sagittal and incompletely assessed. Dedicated MRI of the cervical spine could further characterize if clinically indicated. 4. Moderate left greater than right pleural effusions. Consider dedicated chest imaging. Electronically Signed   By: Margaretha Sheffield M.D.   On: 04/20/2021 07:46     Medical Consultants:   None.   Subjective:    Kittie Plater relates no complaints.  Objective:    Vitals:   04/20/21 2044 04/20/21 2347 04/21/21 0340 04/21/21 0847  BP: (!) 146/84 140/87 140/81 132/80  Pulse: (!) 104 (!) 106 97 98  Resp: 16 16  18   Temp: 98.2 F (36.8 C) 98.1 F (36.7 C) 98.4 F (36.9 C) 97.9 F (36.6 C)  TempSrc: Oral Oral Oral Oral  SpO2: 97% 97% 97% 97%  Weight:      Height:       SpO2: 97 %   Intake/Output Summary (Last 24 hours) at 04/21/2021 0923 Last data filed at 04/21/2021 7858 Gross per 24 hour  Intake 50 ml  Output 150 ml  Net -100 ml   Filed Weights   04/19/21 2130  Weight: 53.1 kg    Exam: General exam: In no acute distress. Respiratory system: Good air movement and clear to auscultation. Cardiovascular system: S1 & S2 heard, RRR. No JVD. Gastrointestinal system: Abdomen is nondistended, soft and nontender.  Extremities: No pedal edema. Skin: No rashes, lesions or ulcers Psychiatry: Judgement and insight appear normal. Mood & affect appropriate.    Data Reviewed:    Labs: Basic Metabolic Panel: Recent Labs  Lab 04/20/21 0111 04/20/21 0120  NA 138 139  K 3.1* 3.5  CL 101 103  CO2  --  25  GLUCOSE 76 79  BUN 9 10  CREATININE 0.50 0.71  CALCIUM  --  7.7*   GFR Estimated Creatinine Clearance: 51.5 mL/min (by C-G formula based on SCr of 0.71 mg/dL). Liver Function Tests: Recent Labs  Lab 04/20/21 0120  AST 44*  ALT 26  ALKPHOS 167*  BILITOT 1.3*  PROT 5.3*  ALBUMIN 1.8*   No results for input(s): LIPASE, AMYLASE in the last 168 hours. No results for input(s): AMMONIA in the last 168 hours. Coagulation profile Recent Labs  Lab 04/20/21 0120  INR 1.0   COVID-19 Labs  No results for input(s): DDIMER, FERRITIN, LDH, CRP in the last 72 hours.  Lab Results  Component Value Date   SARSCOV2NAA NEGATIVE 04/19/2021   [place] NEGATIVE 03/31/2021   [place] NEGATIVE 08/12/2020   [place] Not Detected 03/14/2020    CBC: Recent Labs  Lab  04/20/21 0100 04/20/21 0111  WBC 7.2  --  NEUTROABS 5.4  --   HGB 13.6 15.3*  HCT 38.2 45.0  MCV 95.3  --   PLT 260  --    Cardiac Enzymes: No results for input(s): CKTOTAL, CKMB, CKMBINDEX, TROPONINI in the last 168 hours. BNP (last 3 results) No results for input(s): PROBNP in the last 8760 hours. CBG: No results for input(s): GLUCAP in the last 168 hours. D-Dimer: No results for input(s): DDIMER in the last 72 hours. Hgb A1c: No results for input(s): HGBA1C in the last 72 hours. Lipid Profile: No results for input(s): CHOL, HDL, LDLCALC, TRIG, CHOLHDL, LDLDIRECT in the last 72 hours. Thyroid function studies: No results for input(s): TSH, T4TOTAL, T3FREE, THYROIDAB in the last 72 hours.  Invalid input(s): FREET3 Anemia work up: No results for input(s): VITAMINB12, FOLATE, FERRITIN, TIBC, IRON, RETICCTPCT in the last 72 hours. Sepsis Labs: Recent Labs  Lab 04/20/21 0100  WBC 7.2   Microbiology Recent Results (from the past 240 hour(s))  Resp Panel by RT-PCR (Flu A&B, Covid) Nasopharyngeal Swab     Status: None   Collection Time: 04/19/21 10:51 PM   Specimen: Nasopharyngeal Swab; Nasopharyngeal(NP) swabs in vial transport medium  Result Value Ref Range Status   SARS Coronavirus 2 by RT PCR NEGATIVE NEGATIVE Final    Comment: (NOTE) SARS-CoV-2 target nucleic acids are NOT DETECTED.  The SARS-CoV-2 RNA is generally detectable in upper respiratory specimens during the acute phase of infection. The lowest concentration of SARS-CoV-2 viral copies this assay can detect is 138 copies/mL. A negative result does not preclude SARS-Cov-2 infection and should not be used as the sole basis for treatment or other patient management decisions. A negative result may occur with  improper specimen collection/handling, submission of specimen other than nasopharyngeal swab, presence of viral mutation(s) within the areas targeted by this assay, and inadequate number of  viral copies(<138 copies/mL). A negative result must be combined with clinical observations, patient history, and epidemiological information. The expected result is Negative.  Fact Sheet for Patients:  EntrepreneurPulse.com.au  Fact Sheet for Healthcare Providers:  IncredibleEmployment.be  This test is no t yet approved or cleared by the [place] FDA and  has been authorized for detection and/or diagnosis of SARS-CoV-2 by FDA under an Emergency Use Authorization (EUA). This EUA will remain  in effect (meaning this test can be used) for the duration of the COVID-19 declaration under Section 564(b)(1) of the Act, 21 U.S.C.section 360bbb-3(b)(1), unless the authorization is terminated  or revoked sooner.       Influenza A by PCR NEGATIVE NEGATIVE Final   Influenza B by PCR NEGATIVE NEGATIVE Final    Comment: (NOTE) The Xpert Xpress SARS-CoV-2/FLU/RSV plus assay is intended as an aid in the diagnosis of influenza from Nasopharyngeal swab specimens and should not be used as a sole basis for treatment. Nasal washings and aspirates are unacceptable for Xpert Xpress SARS-CoV-2/FLU/RSV testing.  Fact Sheet for Patients: EntrepreneurPulse.com.au  Fact Sheet for Healthcare Providers: IncredibleEmployment.be  This test is not yet approved or cleared by the [place] FDA and has been authorized for detection and/or diagnosis of SARS-CoV-2 by FDA under an Emergency Use Authorization (EUA). This EUA will remain in effect (meaning this test can be used) for the duration of the COVID-19 declaration under Section 564(b)(1) of the Act, 21 U.S.C. section 360bbb-3(b)(1), unless the authorization is terminated or revoked.  Performed at [facility], [place] [address] [place], [place] [address]      Medications:    Chlorhexidine  Gluconate Cloth  6 each Topical Daily   feeding supplement  237  mL Oral TID BM   hydrOXYzine  50 mg Oral TID   pantoprazole  40 mg Oral Daily   potassium chloride SA  20 mEq Oral Daily   sertraline  25 mg Oral Daily   sodium chloride flush  10-40 mL Intracatheter Q12H   Continuous Infusions:    LOS: 0 days   Charlynne Cousins  [facility]  04/21/2021, 9:23 AM

## 2021-04-21 NOTE — Progress Notes (Signed)
  Echocardiogram 2D Echocardiogram has been performed.  Debbie Barry 04/21/2021, 10:42 AM

## 2021-04-21 NOTE — Care Management Obs Status (Signed)
[place] NOTIFICATION   Patient Details  Name: Debbie Barry MRN: [id number] Date of Birth: [birth year]   Medicare Observation Status Notification Given:  Yes    Carles Collet, RN 04/21/2021, 3:55 PM

## 2021-04-22 ENCOUNTER — Observation Stay (HOSPITAL_COMMUNITY): Payer: Medicare HMO

## 2021-04-22 ENCOUNTER — Encounter: Payer: Self-pay | Admitting: Nurse Practitioner

## 2021-04-22 DIAGNOSIS — E876 Hypokalemia: Secondary | ICD-10-CM | POA: Diagnosis not present

## 2021-04-22 DIAGNOSIS — I10 Essential (primary) hypertension: Secondary | ICD-10-CM | POA: Diagnosis not present

## 2021-04-22 DIAGNOSIS — G1220 Motor neuron disease, unspecified: Secondary | ICD-10-CM | POA: Diagnosis not present

## 2021-04-22 DIAGNOSIS — R8271 Bacteriuria: Secondary | ICD-10-CM | POA: Diagnosis not present

## 2021-04-22 DIAGNOSIS — E43 Unspecified severe protein-calorie malnutrition: Secondary | ICD-10-CM | POA: Diagnosis not present

## 2021-04-22 DIAGNOSIS — R200 Anesthesia of skin: Secondary | ICD-10-CM | POA: Diagnosis not present

## 2021-04-22 DIAGNOSIS — R531 Weakness: Secondary | ICD-10-CM | POA: Diagnosis not present

## 2021-04-22 DIAGNOSIS — R29898 Other symptoms and signs involving the musculoskeletal system: Secondary | ICD-10-CM | POA: Diagnosis not present

## 2021-04-22 DIAGNOSIS — R296 Repeated falls: Secondary | ICD-10-CM | POA: Diagnosis not present

## 2021-04-22 LAB — BASIC METABOLIC PANEL
Anion gap: 6 (ref 5–15)
BUN: 9 mg/dL (ref 8–23)
CO2: 27 mmol/L (ref 22–32)
Calcium: 7.5 mg/dL — ABNORMAL LOW (ref 8.9–10.3)
Chloride: 103 mmol/L (ref 98–111)
Creatinine, Ser: 0.7 mg/dL (ref 0.44–1.00)
GFR, Estimated: >60 mL/min (ref >60)
Glucose, Bld: 78 mg/dL (ref 70–99)
Potassium: 3.7 mmol/L (ref 3.5–5.1)
Sodium: 136 mmol/L (ref 135–145)

## 2021-04-22 LAB — MAGNESIUM: Magnesium: 1.5 mg/dL — ABNORMAL LOW (ref 1.7–2.4)

## 2021-04-22 MED ORDER — DEXAMETHASONE 4 MG PO TABS
4.0000 mg | ORAL_TABLET | Freq: Two times a day (BID) | ORAL | Status: DC
  Administered 2021-04-22 – 2021-04-24 (×6): 4 mg via ORAL
  Filled 2021-04-22 (×7): qty 1

## 2021-04-22 MED ORDER — CALCIUM CARBONATE ANTACID 500 MG PO CHEW
1.0000 | CHEWABLE_TABLET | Freq: Three times a day (TID) | ORAL | Status: DC | PRN
  Administered 2021-04-22 – 2021-04-23 (×2): 200 mg via ORAL
  Filled 2021-04-22 (×2): qty 1

## 2021-04-22 NOTE — NC FL2 (Signed)
[place] MEDICAID FL2 LEVEL OF CARE SCREENING TOOL     IDENTIFICATION  Patient Name: Debbie Barry Birthdate: 07-30-1950 Sex: female Admission Date (Current Location): 04/19/2021  [facility] and [place] Number:  Herbalist and Address:  [facility] [place], [place] [address], [place], [place] [address]      Provider Number: [id number]  Attending Physician Name and Address:  Charlynne Cousins, MD  Relative Name and Phone Number:  Deitra Craine, spouse, [phone]    Current Level of Care: Hospital Recommended Level of Care: [place] Prior Approval Number:    Date Approved/Denied:   PASRR Number: Pending additional review  Discharge Plan: SNF    Current Diagnoses: Patient Active Problem List   Diagnosis Date Noted   Weakness of left lower extremity 04/20/2021   Asymptomatic bacteriuria 04/20/2021   Prolonged QT interval 04/20/2021   Protein-calorie malnutrition, severe 04/03/2021   Gastritis 03/31/2021   Nausea and vomiting 03/31/2021   Iron overload 02/13/2021   Neuropathy 02/13/2021   Elevated LFTs 02/13/2021   Weight loss, non-intentional 02/13/2021   Falls frequently 02/13/2021   GI bleed 08/13/2020   UGIB (upper gastrointestinal bleed) 08/12/2020   Depression 08/12/2020   Hypokalemia 08/12/2020   Essential hypertension, benign 03/23/2019    Orientation RESPIRATION BLADDER Height & Weight     Self, Time, Situation, Place    Continent Weight: 117 lb (53.1 kg) Height:  5\' 1"  (154.9 cm)  BEHAVIORAL SYMPTOMS/MOOD NEUROLOGICAL BOWEL NUTRITION STATUS      Continent Diet (Heart healthy)  AMBULATORY STATUS COMMUNICATION OF NEEDS Skin   Extensive Assist Verbally Normal                       Personal Care Assistance Level of Assistance  Bathing, Feeding, Dressing Bathing Assistance: Limited assistance Feeding assistance: Independent Dressing Assistance: Limited assistance     Functional Limitations Info              [place]  PT (By licensed PT), OT (By licensed OT)     PT Frequency: 5x weekly OT Frequency: 5x weekly            Contractures Contractures Info: Not present    Additional Factors Info  Code Status, Allergies Code Status Info: Full Allergies Info: NKDA           Current Medications (04/22/2021):  This is the current hospital active medication list Current Facility-Administered Medications  Medication Dose Route Frequency Provider Last Rate Last Admin   acetaminophen (TYLENOL) tablet 650 mg  650 mg Oral Q6H PRN Reubin Milan, MD   650 mg at 04/21/21 2228   Or   acetaminophen (TYLENOL) suppository 650 mg  650 mg Rectal Q6H PRN Reubin Milan, MD       calcium carbonate (TUMS - dosed in mg elemental calcium) chewable tablet 200 mg of elemental calcium  1 tablet Oral Q8H PRN Charlynne Cousins, MD   200 mg of elemental calcium at 04/22/21 1311   Chlorhexidine Gluconate Cloth 2 % PADS 6 each  6 each Topical Daily Reubin Milan, MD   6 each at 04/22/21 0941   dexamethasone (DECADRON) tablet 4 mg  4 mg Oral Q12H Charlynne Cousins, MD   4 mg at 04/22/21 0954   feeding supplement (ENSURE ENLIVE / ENSURE PLUS) liquid 237 mL  237 mL Oral TID BM Reubin Milan, MD   237 mL at 04/21/21 2018  hydrOXYzine (ATARAX/VISTARIL) tablet 50 mg  50 mg Oral TID Reubin Milan, MD   50 mg at 04/22/21 0954   pantoprazole (PROTONIX) EC tablet 40 mg  40 mg Oral Daily Reubin Milan, MD   40 mg at 04/22/21 2637   sertraline (ZOLOFT) tablet 25 mg  25 mg Oral Daily Reubin Milan, MD   25 mg at 04/22/21 0954   sodium chloride flush (NS) 0.9 % injection 10-40 mL  10-40 mL Intracatheter Q12H Reubin Milan, MD   10 mL at 04/22/21 0957   sodium chloride flush (NS) 0.9 % injection 10-40 mL  10-40 mL Intracatheter PRN Reubin Milan, MD         Discharge Medications: Please see discharge summary for a list of discharge  medications.  Relevant Imaging Results:  Relevant Lab Results:   Additional Information SSN: [id number], COVID immunizations; pfizer 09/16/2019, 10/07/2019  Alfredia Ferguson, LCSW

## 2021-04-22 NOTE — Evaluation (Signed)
Occupational Therapy Evaluation Patient Details Name: Debbie Barry MRN: [id number] DOB: [birth year] Today's Date: 04/22/2021   History of Present Illness Pt is a 70 y/o female admitted 10/20 secondary to increased BLE weakness (LLE>RLE. Imaging showedchronic L3-4 compression fx and L4-5 severe canal stenosis. Per neurosurgery notes, to be managed conservatively for now. PMH includes HTN and PVD.   Clinical Impression   Pt PTA: Pt living with family and progressively getting weaker at home. Pt currently requires set-upA to [place] for ADL. Pt incontinent at this time requiring depends and pure wick to be changed often.  Pt heavy modA +2 for bed mobilty and transfers taking a few steps to recliner. Severe weakness and limited activity tolerance leading to inability to properly care for self. Family not able to maintain this assist at home. Pt on board with attending a rehab facility for therapy. Pt would benefit from continued OT skilled services. OT following acutely.     Recommendations for follow up therapy are one component of a multi-disciplinary discharge planning process, led by the attending physician.  Recommendations may be updated based on patient status, additional functional criteria and insurance authorization.   Follow Up Recommendations  SNF    Equipment Recommendations  None recommended by OT    Recommendations for Other Services       Precautions / Restrictions Precautions Precautions: Fall Restrictions Weight Bearing Restrictions: No      Mobility Bed Mobility Overal bed mobility: Needs Assistance Bed Mobility: Rolling;Sidelying to Sit;Sit to Sidelying Rolling: Mod assist;+2 for physical assistance Sidelying to sit: Mod assist;+2 for physical assistance     Sit to sidelying: Mod assist;+2 for physical assistance General bed mobility comments: Mod A +2 for trunk and LE assist.    Transfers Overall transfer level: Needs assistance Equipment used:  Rolling walker (2 wheeled) Transfers: Sit to/from Stand Sit to Stand: Mod assist;+2 safety/equipment         General transfer comment: Pt very fearful of falling and required reinforcement that [place] +2 for heavy lean and requiring assist to keep head up. Very flexed posture and required cues for upright.    Balance Overall balance assessment: Needs assistance Sitting-balance support: Feet supported Sitting balance-Leahy Scale: Fair     Standing balance support: Bilateral upper extremity supported;During functional activity Standing balance-Leahy Scale: Poor Standing balance comment: Reliant on UE and external support                           ADL either performed or assessed with clinical judgement   ADL Overall ADL's : Needs assistance/impaired Eating/Feeding: Set up;Sitting   Grooming: Wash/dry hands;Wash/dry face;Oral care;Set up;Sitting   Upper Body Bathing: Minimal assistance;Sitting   Lower Body Bathing: Maximal assistance;Sitting/lateral leans;Sit to/from stand   Upper Body Dressing : Minimal assistance   Lower Body Dressing: Total assistance;+2 for physical assistance;+2 for safety/equipment;Sitting/lateral leans;Sit to/from stand   Toilet Transfer: Moderate assistance;+2 for safety/equipment;+2 for physical assistance;Cueing for safety   Toileting- Clothing Manipulation and Hygiene: Total assistance;Bed level       Functional mobility during ADLs: Maximal assistance;+2 for safety/equipment;+2 for physical assistance;Cueing for safety;Cueing for sequencing General ADL Comments: Pt requires set-upA to [place] for ADL. Pt incontinent at this time requiring depends and pure wick to be changed often. Family not able to maintain this assist at home.     Vision Baseline Vision/History: 0 No visual deficits Ability to See in Adequate Light: 0 Adequate Patient Visual Report: No change  from baseline Vision Assessment?: No apparent visual deficits      Perception     Praxis      Pertinent Vitals/Pain Pain Assessment: No/denies pain     Hand Dominance Right   Extremity/Trunk Assessment Upper Extremity Assessment Upper Extremity Assessment: Generalized weakness RUE Deficits / Details: patient unable to lift shoulder past 20 degrees of flexion. noted to have mild edema LUE Deficits / Details: patient unable to flex shoulder past 20 degrees with moderate edema noted in UE   Lower Extremity Assessment RLE Deficits / Details: Able to perform heel slide and ankle pump LLE Deficits / Details: Pt reports she is unable to move LLE, however, did not require much assist to perform heel slide.   Cervical / Trunk Assessment Cervical / Trunk Assessment: Kyphotic;Other exceptions Cervical / Trunk Exceptions: chronic L3-4 compression fx.   Communication Communication Communication: Other (comment) (very soft spoken)   Cognition Arousal/Alertness: Awake/alert Behavior During Therapy: WFL for tasks assessed/performed;Anxious Overall Cognitive Status: No family/caregiver present to determine baseline cognitive functioning                                 General Comments: Very fearful of falling with increased anxiety when it came to transfers.   General Comments  VSS.    Exercises     Shoulder Instructions      Home Living Family/patient expects to be discharged to:: Private residence Living Arrangements: Spouse/significant other Available Help at Discharge: Family;Available 24 hours/day Type of Home: House Home Access: Level entry     Home Layout: One level     Bathroom Shower/Tub: Occupational psychologist: Handicapped height Bathroom Accessibility: Yes   Home Equipment: Toilet riser;Walker - 2 wheels          Prior Functioning/Environment Level of Independence: Independent with assistive device(s);Needs assistance  Gait / Transfers Assistance Needed: typically independent, recent falls and now  staying in bed ADL's / Homemaking Assistance Needed: Reports husband has had to assist with ADL tasks since d/c from hospital, but prior to that was independent Communication / Swallowing Assistance Needed: low toned voice Comments: pt reports that she requires assist with ADL, but prior to that she was independent.        OT Problem List: Decreased strength;Decreased range of motion;Impaired balance (sitting and/or standing);Decreased safety awareness;Decreased activity tolerance;Decreased knowledge of precautions;Decreased knowledge of use of DME or AE;Decreased coordination      OT Treatment/Interventions: Self-care/ADL training;Therapeutic exercise;DME and/or AE instruction;Therapeutic activities;Balance training;Patient/family education    OT Goals(Current goals can be found in the care plan section) Acute Rehab OT Goals Patient Stated Goal: to get stronger before going home OT Goal Formulation: With patient Time For Goal Achievement: 05/06/21 Potential to Achieve Goals: Good ADL Goals Pt Will Perform Grooming: with set-up;sitting Pt Will Transfer to Toilet: with min assist;with +2 assist;bedside commode Additional ADL Goal #1: Pt will perform OOB ADL tasks x5 mins with minA overall to increase activity tolerance.  OT Frequency: Min 2X/week   Barriers to D/C: Decreased caregiver support          Co-evaluation PT/OT/SLP Co-Evaluation/Treatment: Yes Reason for Co-Treatment: Complexity of the patient's impairments (multi-system involvement);To address functional/ADL transfers PT goals addressed during session: Mobility/safety with mobility OT goals addressed during session: ADL's and self-care      AM-PAC OT "6 Clicks" Daily Activity     Outcome Measure Help from another person eating meals?: A  Little Help from another person taking care of personal grooming?: A Little Help from another person toileting, which includes using toliet, bedpan, or urinal?: Total Help from  another person bathing (including washing, rinsing, drying)?: A Lot Help from another person to put on and taking off regular upper body clothing?: A Lot Help from another person to put on and taking off regular lower body clothing?: Total 6 Click Score: 12   End of Session Equipment Utilized During Treatment: Rolling walker Nurse Communication: Mobility status  Activity Tolerance: Patient limited by pain Patient left: in chair;with call bell/phone within reach;with chair alarm set  OT Visit Diagnosis: Unsteadiness on feet (R26.81);Other abnormalities of gait and mobility (R26.89);History of falling (Z91.81);Muscle weakness (generalized) (M62.81)                Time: 2263-3354 OT Time Calculation (min): 22 min Charges:  OT General Charges $OT Visit: 1 Visit OT Evaluation $OT Eval Moderate Complexity: 1 Mod  Jefferey Pica, OTR/L Acute Rehabilitation Services Pager: [phone] Office: [phone]   BWIOMBT C 04/22/2021, 12:55 PM

## 2021-04-22 NOTE — Evaluation (Signed)
Physical Therapy Evaluation Patient Details Name: Debbie Barry MRN: [id number] DOB: [birth year] Today's Date: 04/22/2021  History of Present Illness  Pt is a 70 y/o female admitted 10/20 secondary to increased BLE weakness (LLE>RLE. Imaging showedchronic L3-4 compression fx and L4-5 severe canal stenosis. Per neurosurgery notes, to be managed conservatively for now. PMH includes HTN and PVD.  Clinical Impression  Pt admitted secondary to problem above with deficits below. Pt very fearful of falling when performing transfers. Required mod A +2 for bed mobility and to stand X2. Pt reports since being d/c'd after previous admission, she has been unable to ambulate and has been staying in the bed. Recommending SNF level therapies at d/c to increase independence and safety with mobility. Will continue to follow acutely.        Recommendations for follow up therapy are one component of a multi-disciplinary discharge planning process, led by the attending physician.  Recommendations may be updated based on patient status, additional functional criteria and insurance authorization.  Follow Up Recommendations SNF    Equipment Recommendations  Wheelchair cushion (measurements PT);Wheelchair (measurements PT);Hospital bed    Recommendations for Other Services       Precautions / Restrictions Precautions Precautions: Fall Precaution Comments: monitor HR Restrictions Weight Bearing Restrictions: No      Mobility  Bed Mobility Overal bed mobility: Needs Assistance Bed Mobility: Rolling;Sidelying to Sit;Sit to Sidelying Rolling: Mod assist;+2 for physical assistance Sidelying to sit: Mod assist;+2 for physical assistance     Sit to sidelying: Mod assist;+2 for physical assistance General bed mobility comments: Mod A +2 for trunk and LE assist.    Transfers Overall transfer level: Needs assistance Equipment used: Rolling walker (2 wheeled) Transfers: Sit to/from Stand Sit to  Stand: Mod assist;+2 safety/equipment         General transfer comment: Pt very fearful of falling and required reinforcement that PT was supporting her. Was able to stand X2 with mod a +2 for lift assist and steadying. Very flexed posture and required cues for upright.  Ambulation/Gait                Stairs            Wheelchair Mobility    Modified Rankin (Stroke Patients Only)       Balance Overall balance assessment: Needs assistance Sitting-balance support: Feet supported Sitting balance-Leahy Scale: Fair     Standing balance support: Bilateral upper extremity supported;During functional activity Standing balance-Leahy Scale: Poor Standing balance comment: Reliant on UE and external support                             Pertinent Vitals/Pain Pain Assessment: No/denies pain    Home Living Family/patient expects to be discharged to:: Private residence Living Arrangements: Spouse/significant other Available Help at Discharge: Family;Available 24 hours/day Type of Home: House Home Access: Level entry     Home Layout: One level Home Equipment: Toilet riser;Walker - 2 wheels      Prior Function Level of Independence: Independent with assistive device(s);Needs assistance   Gait / Transfers Assistance Needed: typically independent, recent falls and now staying in bed  ADL's / Homemaking Assistance Needed: Reports husband has had to assist with ADL tasks since d/c from hospital, but prior to that was independent        Hand Dominance   Dominant Hand: Right    Extremity/Trunk Assessment   Upper Extremity Assessment Upper Extremity Assessment: Defer to  OT evaluation    Lower Extremity Assessment Lower Extremity Assessment: LLE deficits/detail;RLE deficits/detail;Generalized weakness RLE Deficits / Details: Able to perform heel slide and ankle pump LLE Deficits / Details: Pt reports she is unable to move LLE, however, did not require  much assist to perform heel slide.    Cervical / Trunk Assessment Cervical / Trunk Assessment: Kyphotic;Other exceptions Cervical / Trunk Exceptions: chronic L3-4 compression fx.  Communication   Communication: Other (comment) (very soft spoken)  Cognition Arousal/Alertness: Awake/alert Behavior During Therapy: WFL for tasks assessed/performed;Anxious Overall Cognitive Status: No family/caregiver present to determine baseline cognitive functioning                                 General Comments: Very fearful of falling with increased anxiety when it came to transfers.      General Comments      Exercises     Assessment/Plan    PT Assessment Patient needs continued PT services  PT Problem List Decreased strength;Decreased activity tolerance;Decreased balance;Decreased mobility;Decreased knowledge of use of DME;Decreased safety awareness;Decreased knowledge of precautions       PT Treatment Interventions Gait training;Balance training;DME instruction;Therapeutic exercise;Functional mobility training;Therapeutic activities;Patient/family education    PT Goals (Current goals can be found in the Care Plan section)  Acute Rehab PT Goals Patient Stated Goal: to get stronger before going home PT Goal Formulation: With patient Time For Goal Achievement: 05/06/21 Potential to Achieve Goals: Fair    Frequency Min 2X/week   Barriers to discharge        Co-evaluation PT/OT/SLP Co-Evaluation/Treatment: Yes Reason for Co-Treatment: For patient/therapist safety;To address functional/ADL transfers PT goals addressed during session: Mobility/safety with mobility;Balance;Proper use of DME         AM-PAC PT "6 Clicks" Mobility  Outcome Measure Help needed turning from your back to your side while in a flat bed without using bedrails?: Total Help needed moving from lying on your back to sitting on the side of a flat bed without using bedrails?: Total Help needed  moving to and from a bed to a chair (including a wheelchair)?: Total Help needed standing up from a chair using your arms (e.g., wheelchair or bedside chair)?: Total Help needed to walk in hospital room?: Total Help needed climbing 3-5 steps with a railing? : Total 6 Click Score: 6    End of Session Equipment Utilized During Treatment: Gait belt Activity Tolerance: Patient tolerated treatment well Patient left: in bed;with call bell/phone within reach;with chair alarm set;Other (comment) (in chair position) Nurse Communication: Mobility status PT Visit Diagnosis: Difficulty in walking, not elsewhere classified (R26.2);Muscle weakness (generalized) (M62.81);History of falling (Z91.81)    Time: 7902-4097 PT Time Calculation (min) (ACUTE ONLY): 21 min   Charges:   PT Evaluation $PT Eval Moderate Complexity: 1 Mod          Reuel Derby, PT, DPT  Acute Rehabilitation Services  Pager: [phone] Office: [phone]   Rudean Hitt 04/22/2021, 8:58 AM

## 2021-04-22 NOTE — TOC Initial Note (Signed)
Transition of Care [facility]) - Initial/Assessment Note    Patient Details  Name: Debbie Barry MRN: [id number] Date of Birth: August 09, 1950  Transition of [facility]) CM/SW Contact:    Coralee Pesa, [place] Phone Number: 04/22/2021, 3:08 PM  Clinical Narrative:                 CSW spoke with pt at bedside to discuss rec for SNF. Pt is agreeable and requests to stay in [place]. Pt states she has had no covid boosters, just the initial shots. Pt requests CSW call and speak with her spouse. Spouse is also agreeable, and notes the plan is for her to come back home after rehab. Medicare list provided. CSW will send referral's. TOC will continue to follow for DC needs.  Expected Discharge Plan: Skilled Nursing Facility Barriers to Discharge: SNF Pending bed offer, Continued Medical Work up, Ship broker   Patient Goals and CMS Choice Patient states their goals for this hospitalization and ongoing recovery are:: To be able to return home CMS Medicare.gov Compare Post Acute Care list provided to:: Patient Choice offered to / list presented to : Patient  Expected Discharge Plan and Services Expected Discharge Plan: [place] Choice: [place] arrangements for the past 2 months: Single Family Home Expected Discharge Date:  (unknown)                                    Prior Living Arrangements/Services Living arrangements for the past 2 months: Single Family Home Lives with:: Spouse Patient language and need for interpreter reviewed:: Yes Do you feel safe going back to the place where you live?: Yes      Need for Family Participation in Patient Care: Yes (Comment) Care giver support system in place?: Yes (comment)   Criminal Activity/Legal Involvement Pertinent to Current Situation/Hospitalization: No - Comment as needed  Activities of Daily Living Home Assistive Devices/Equipment: Blood pressure cuff, Cane  (specify quad or straight), Built-in shower seat, Walker (specify type), Other (Comment) (single point cane, front wheeled walker, walk-in shower, handicap height toilet) ADL Screening (condition at time of admission) Patient's cognitive ability adequate to safely complete daily activities?: Yes Is the patient deaf or have difficulty hearing?: No Does the patient have difficulty seeing, even when wearing glasses/contacts?: No Does the patient have difficulty concentrating, remembering, or making decisions?: No Patient able to express need for assistance with ADLs?: Yes Does the patient have difficulty dressing or bathing?: Yes (secondary to not being able to mover her legs) Independently performs ADLs?: No (secondary to not being able to mover her legs) Communication: Independent Dressing (OT): Needs assistance Is this a change from baseline?: Change from baseline, expected to last >3 days Grooming: Independent Feeding: Independent Bathing: Needs assistance Is this a change from baseline?: Change from baseline, expected to last >3 days Toileting: Dependent Is this a change from baseline?: Change from baseline, expected to last >3days In/Out Bed: Dependent Is this a change from baseline?: Change from baseline, expected to last >3 days Walks in Home: Dependent Is this a change from baseline?: Change from baseline, expected to last >3 days Does the patient have difficulty walking or climbing stairs?: Yes (secondary to not being able to mover her legs) Weakness of Legs: Both Weakness of Arms/Hands: None  Permission Sought/Granted Permission sought to share information with : Family Supports Permission granted  to share information with : Yes, Verbal Permission Granted  Share Information with NAME: Zarie Kosiba     Permission granted to share info w Relationship: Spouse  Permission granted to share info w Contact Information: [phone]  Emotional Assessment Appearance:: Appears  stated age Attitude/Demeanor/Rapport: Engaged Affect (typically observed): Pleasant Orientation: : Oriented to Self, Oriented to Place, Oriented to  Time, Oriented to Situation Alcohol / Substance Use: Not Applicable Psych Involvement: No (comment)  Admission diagnosis:  Weakness of left lower extremity [R29.898] Patient Active Problem List   Diagnosis Date Noted   Weakness of left lower extremity 04/20/2021   Asymptomatic bacteriuria 04/20/2021   Prolonged QT interval 04/20/2021   Protein-calorie malnutrition, severe 04/03/2021   Gastritis 03/31/2021   Nausea and vomiting 03/31/2021   Iron overload 02/13/2021   Neuropathy 02/13/2021   Elevated LFTs 02/13/2021   Weight loss, non-intentional 02/13/2021   Falls frequently 02/13/2021   GI bleed 08/13/2020   UGIB (upper gastrointestinal bleed) 08/12/2020   Depression 08/12/2020   Hypokalemia 08/12/2020   Essential hypertension, benign 03/23/2019   PCP:  Glendale Chard, MD Pharmacy:   [facility] [place], [address] [place] [address] Phone: [phone] Fax: [phone]     Social Determinants of Health (SDOH) Interventions    Readmission Risk Interventions No flowsheet data found.

## 2021-04-22 NOTE — Progress Notes (Signed)
MRI cervical spine: 1. Multilevel cervical spondylosis with disc bulging, uncinate spurring and facet hypertrophy as described. 2. There is mild central spinal stenosis at multiple levels as detailed above. There is mild cord flattening on the right at C6-7. No abnormal cord signal. 3. Multilevel significant osseous foraminal narrowing which could affect the exiting nerve roots. 4. No acute findings demonstrated.  A/R:  70 year old female re-presenting with severe left > right lower extremity weakness since discharge from the hospital on 10/5.  1. Would obtain Neurosurgery consult to assess the severe spinal stenosis with cauda equina compression at the L4-5 level, as seen on MRI performed 10/21. 2. PT and OT for patient's deconditioning with associated diffuse muscle atrophy as seen on clinical exam as well as MRI.  3. May also benefit from outpatient Neurology evaluation for EMG/NCS 4. Neurohospitalist service will sign off. Please call if there are additional questions.   Electronically signed: Dr. Kerney Elbe

## 2021-04-22 NOTE — Progress Notes (Signed)
[facility] PROGRESS NOTE    Progress Note  Debbie Barry  TGG:[id number] DOB: [birth year] DOA: 04/19/2021 PCP: Glendale Chard, MD     Brief Narrative:   Debbie Barry is an 70 y.o. female past medical history significant for fatty liver, GERD, essential hypertension, osteomyelitis of the right toe, peripheral vascular disease, history of upper GI bleed due to peptic ulcers who was recently discharged from the hospital for gastroenteritis, iron overload and maculopapular rash she is coming in today complaining of progressive weakness both lower extremities, falls and numbness.,  She had a Foley catheter placed for urinary retention couple of weeks ago she has not had a bowel movement in the past week, and MRI of the brain was done showed no acute CVA mild chronic microvascular disease MRI of the lumbar spine nonhealed L3-L4 compression fracture, severe canal stenosis at L4-L5 with severe right and moderate foraminal stenosis, MRI of the thoracic spine show multiple mild foraminal stenosis degenerative changes and at least moderate foraminal stenosis C6-C7 and C7-T1, with a moderate left greater than right pleural effusion.  Neurosurgery was curb sided that they relate that her degenerative stenosis would not be causing any of her acute neurological symptoms.  Additionally as she has a healing compression fracture of the lumbar spine these will need to be fully healed prior to surgery.  In the meantime they recommended an LCL brace and dexamethasone taper for radicular pain.    Assessment/Plan:   Principal Problem: Frequent falls with  Weakness of left lower extremities: MRI of the C-spine sinus pending.  Awaiting PT OT evaluation. We will get physical therapy evaluated the patient. Neurology was consulted who recommended an MRI of the cervical spine which is pending  Hypokalemia: Orally now 3.5 we will continue to replete orally recheck in the morning.  Prolonged QTC/history  of multifocal atrial tachycardia: Patient relates palpitation twice try to keep potassium greater than 4 magnesium greater than 2.  History of gastritis without esophagitis: Continue PPI and antiemetics.  Skin rash: Continue hydroxyzine.  Essential hypertension: Currently on no antihypertensive medication, her blood pressure is relatively well controlled follow-up with PCP as an outpatient. Keep the patient off antihypertensive medication.  Depression: Continue sertraline.  Severe protein caloric malnutrition: Continue Ensure 3 times daily.  Symptomatic bacteriuria: He has no urinary symptoms flank pain fever or leukocytosis. Has remained afebrile in the leukocytosis follow-up with urology as an outpatient.     DVT prophylaxis: lovenox Family Communication:noen Status is: Observation  The patient remains OBS appropriate and will d/c before 2 midnights.       Code Status:     Code Status Orders  (From admission, onward)           Start     Ordered   04/20/21 0956  Full code  Continuous        04/20/21 0958           Code Status History     Date Active Date Inactive Code Status Order ID Comments User Context   03/31/2021 2100 04/05/2021 0008 Full Code [id number]  Etta Quill, DO ED   08/12/2020 2236 08/15/2020 1635 Full Code [id number]  Dessa Phi, DO ED         IV Access:   Peripheral IV   Procedures and diagnostic studies:   ECHOCARDIOGRAM COMPLETE  Result Date: 04/21/2021    ECHOCARDIOGRAM REPORT   Patient Name:   Debbie Barry Date of Exam: 04/21/2021 Medical Rec #:  [id number]  Height:       61.0 in Accession #:    [id number]         Weight:       117.0 lb Date of Birth:  February 21, 1951         BSA:          1.504 m Patient Age:    67 years           BP:           132/80 mmHg Patient Gender: F                  HR:           100 bpm. Exam Location:  Inpatient Procedure: 2D Echo Indications:    Chest pain. Abnormal ecg.   History:        Patient has no prior history of Echocardiogram examinations.                 Pleural effusion; Risk Factors:Hypertension.  Sonographer:    Johny Chess RDCS Referring Phys: [id number] [place]  1. Left ventricular ejection fraction, by estimation, is 45 to 50%. The left ventricle has low normal function. The left ventricle demonstrates regional wall motion abnormalities (see scoring diagram/findings for description). Left ventricular diastolic  parameters are consistent with Grade I diastolic dysfunction (impaired relaxation).  2. Right ventricular systolic function is normal. The right ventricular size is normal. Tricuspid regurgitation signal is inadequate for assessing PA pressure.  3. Large pleural effusion in the left lateral region.  4. The mitral valve is normal in structure. Trivial mitral valve regurgitation. No evidence of mitral stenosis.  5. The aortic valve is tricuspid. There is mild calcification of the aortic valve. There is mild thickening of the aortic valve. Aortic valve regurgitation is not visualized. No aortic stenosis is present.  6. The inferior vena cava is normal in size with greater than 50% respiratory variability, suggesting right atrial pressure of 3 mmHg. FINDINGS  Left Ventricle: The anteroseptal wall and apex appear hypokinetic. Left ventricular ejection fraction, by estimation, is 45 to 50%. The left ventricle has low normal function. The left ventricle demonstrates regional wall motion abnormalities. The left ventricular internal cavity size was normal in size. There is no left ventricular hypertrophy. Left ventricular diastolic parameters are consistent with Grade I diastolic dysfunction (impaired relaxation). Normal left ventricular filling pressure. Right Ventricle: The right ventricular size is normal. No increase in right ventricular wall thickness. Right ventricular systolic function is normal. Tricuspid regurgitation signal is  inadequate for assessing PA pressure. Left Atrium: Left atrial size was normal in size. Right Atrium: Right atrial size was normal in size. Pericardium: There is no evidence of pericardial effusion. Mitral Valve: The mitral valve is normal in structure. Trivial mitral valve regurgitation. No evidence of mitral valve stenosis. Tricuspid Valve: The tricuspid valve is normal in structure. Tricuspid valve regurgitation is not demonstrated. No evidence of tricuspid stenosis. Aortic Valve: The aortic valve is tricuspid. There is mild calcification of the aortic valve. There is mild thickening of the aortic valve. There is mild aortic valve annular calcification. Aortic valve regurgitation is not visualized. No aortic stenosis  is present. Aortic valve mean gradient measures 2.5 mmHg. Aortic valve peak gradient measures 5.6 mmHg. Aortic valve area, by VTI measures 2.19 cm. Pulmonic Valve: The pulmonic valve was not well visualized. Pulmonic valve regurgitation is not visualized. No evidence of pulmonic stenosis. Aorta: The aortic root is normal in size  and structure. Venous: The inferior vena cava is normal in size with greater than 50% respiratory variability, suggesting right atrial pressure of 3 mmHg. IAS/Shunts: No atrial level shunt detected by color flow Doppler. Additional Comments: There is a large pleural effusion in the left lateral region.  LEFT VENTRICLE PLAX 2D LVIDd:         4.10 cm     Diastology LVIDs:         3.20 cm     LV e' medial:    6.20 cm/s LV PW:         0.90 cm     LV E/e' medial:  7.6 LV IVS:        0.80 cm     LV e' lateral:   5.98 cm/s LVOT diam:     2.00 cm     LV E/e' lateral: 7.9 LV SV:         46 LV SV Index:   30 LVOT Area:     3.14 cm  LV Volumes (MOD) LV vol d, MOD A2C: 50.8 ml LV vol d, MOD A4C: 68.8 ml LV vol s, MOD A2C: 28.6 ml LV vol s, MOD A4C: 45.8 ml LV SV MOD A2C:     22.2 ml LV SV MOD A4C:     68.8 ml LV SV MOD BP:      21.8 ml RIGHT VENTRICLE             IVC RV S prime:      14.70 cm/s  IVC diam: 1.00 cm TAPSE (M-mode): 1.6 cm LEFT ATRIUM             Index        RIGHT ATRIUM           Index LA diam:        2.40 cm 1.60 cm/m   RA Area:     12.10 cm LA Vol (A2C):   23.4 ml 15.56 ml/m  RA Volume:   27.10 ml  18.02 ml/m LA Vol (A4C):   45.2 ml 30.06 ml/m LA Biplane Vol: 34.7 ml 23.07 ml/m  AORTIC VALVE AV Area (Vmax):    2.47 cm AV Area (Vmean):   2.70 cm AV Area (VTI):     2.19 cm AV Vmax:           118.62 cm/s AV Vmean:          70.876 cm/s AV VTI:            0.210 m AV Peak Grad:      5.6 mmHg AV Mean Grad:      2.5 mmHg LVOT Vmax:         93.40 cm/s LVOT Vmean:        60.900 cm/s LVOT VTI:          0.146 m LVOT/AV VTI ratio: 0.70  AORTA Ao Root diam: 2.70 cm Ao Asc diam:  2.90 cm MV E velocity: 47.40 cm/s MV A velocity: 94.70 cm/s  SHUNTS MV E/A ratio:  0.50        Systemic VTI:  0.15 m                            Systemic Diam: 2.00 cm Carlyle Dolly MD Electronically signed by Carlyle Dolly MD Signature Date/Time: 04/21/2021/11:55:23 AM    Final      Medical Consultants:   None.   Subjective:  Debbie Barry has no new complaints tolerating her diet.  Objective:    Vitals:   04/21/21 2002 04/21/21 2359 04/22/21 0328 04/22/21 0817  BP: 138/62 130/82 128/81 139/86  Pulse: (!) 108 (!) 110 (!) 106 (!) 104  Resp: 18 18 18 17   Temp: 98.7 F (37.1 C) 98.7 F (37.1 C) 98.1 F (36.7 C) 98 F (36.7 C)  TempSrc: Oral Oral Oral Oral  SpO2: 97% 97% 96% 98%  Weight:      Height:       SpO2: 98 %   Intake/Output Summary (Last 24 hours) at 04/22/2021 0856 Last data filed at 04/22/2021 0329 Gross per 24 hour  Intake 350 ml  Output 425 ml  Net -75 ml    Filed Weights   04/19/21 2130  Weight: 53.1 kg    Exam: General exam: In no acute distress. Respiratory system: Good air movement and clear to auscultation. Cardiovascular system: S1 & S2 heard, RRR. No JVD. Gastrointestinal system: Abdomen is nondistended, soft and nontender.   Extremities: No pedal edema. Skin: No rashes, lesions or ulcers Psychiatry: Judgement and insight appear normal. Mood & affect appropriate.  Data Reviewed:    Labs: Basic Metabolic Panel: Recent Labs  Lab 04/20/21 0111 04/20/21 0120 04/22/21 0349  NA 138 139 136  K 3.1* 3.5 3.7  CL 101 103 103  CO2  --  25 27  GLUCOSE 76 79 78  BUN 9 10 9   CREATININE 0.50 0.71 0.70  CALCIUM  --  7.7* 7.5*  MG  --   --  1.5*    GFR Estimated Creatinine Clearance: 51.5 mL/min (by C-G formula based on SCr of 0.7 mg/dL). Liver Function Tests: Recent Labs  Lab 04/20/21 0120  AST 44*  ALT 26  ALKPHOS 167*  BILITOT 1.3*  PROT 5.3*  ALBUMIN 1.8*    No results for input(s): LIPASE, AMYLASE in the last 168 hours. No results for input(s): AMMONIA in the last 168 hours. Coagulation profile Recent Labs  Lab 04/20/21 0120  INR 1.0    COVID-19 Labs  No results for input(s): DDIMER, FERRITIN, LDH, CRP in the last 72 hours.  Lab Results  Component Value Date   SARSCOV2NAA NEGATIVE 04/19/2021   SARSCOV2NAA NEGATIVE 03/31/2021   [place] NEGATIVE 08/12/2020   [place] Not Detected 03/14/2020    CBC: Recent Labs  Lab 04/20/21 0100 04/20/21 0111  WBC 7.2  --   NEUTROABS 5.4  --   HGB 13.6 15.3*  HCT 38.2 45.0  MCV 95.3  --   PLT 260  --     Cardiac Enzymes: No results for input(s): CKTOTAL, CKMB, CKMBINDEX, TROPONINI in the last 168 hours. BNP (last 3 results) No results for input(s): PROBNP in the last 8760 hours. CBG: No results for input(s): GLUCAP in the last 168 hours. D-Dimer: No results for input(s): DDIMER in the last 72 hours. Hgb A1c: No results for input(s): HGBA1C in the last 72 hours. Lipid Profile: No results for input(s): CHOL, HDL, LDLCALC, TRIG, CHOLHDL, LDLDIRECT in the last 72 hours. Thyroid function studies: No results for input(s): TSH, T4TOTAL, T3FREE, THYROIDAB in the last 72 hours.  Invalid input(s): FREET3 Anemia work up: No results  for input(s): VITAMINB12, FOLATE, FERRITIN, TIBC, IRON, RETICCTPCT in the last 72 hours. Sepsis Labs: Recent Labs  Lab 04/20/21 0100  WBC 7.2    Microbiology Recent Results (from the past 240 hour(s))  Resp Panel by RT-PCR (Flu A&B, Covid) Nasopharyngeal Swab  Status: None   Collection Time: 04/19/21 10:51 PM   Specimen: Nasopharyngeal Swab; Nasopharyngeal(NP) swabs in vial transport medium  Result Value Ref Range Status   SARS Coronavirus 2 by RT PCR NEGATIVE NEGATIVE Final    Comment: (NOTE) SARS-CoV-2 target nucleic acids are NOT DETECTED.  The SARS-CoV-2 RNA is generally detectable in upper respiratory specimens during the acute phase of infection. The lowest concentration of SARS-CoV-2 viral copies this assay can detect is 138 copies/mL. A negative result does not preclude SARS-Cov-2 infection and should not be used as the sole basis for treatment or other patient management decisions. A negative result may occur with  improper specimen collection/handling, submission of specimen other than nasopharyngeal swab, presence of viral mutation(s) within the areas targeted by this assay, and inadequate number of viral copies(<138 copies/mL). A negative result must be combined with clinical observations, patient history, and epidemiological information. The expected result is Negative.  Fact Sheet for Patients:  EntrepreneurPulse.com.au  Fact Sheet for Healthcare Providers:  IncredibleEmployment.be  This test is no t yet approved or cleared by the [place] FDA and  has been authorized for detection and/or diagnosis of SARS-CoV-2 by FDA under an Emergency Use Authorization (EUA). This EUA will remain  in effect (meaning this test can be used) for the duration of the COVID-19 declaration under Section 564(b)(1) of the Act, 21 U.S.C.section 360bbb-3(b)(1), unless the authorization is terminated  or revoked sooner.       Influenza A  by PCR NEGATIVE NEGATIVE Final   Influenza B by PCR NEGATIVE NEGATIVE Final    Comment: (NOTE) The Xpert Xpress SARS-CoV-2/FLU/RSV plus assay is intended as an aid in the diagnosis of influenza from Nasopharyngeal swab specimens and should not be used as a sole basis for treatment. Nasal washings and aspirates are unacceptable for Xpert Xpress SARS-CoV-2/FLU/RSV testing.  Fact Sheet for Patients: EntrepreneurPulse.com.au  Fact Sheet for Healthcare Providers: IncredibleEmployment.be  This test is not yet approved or cleared by the [place] FDA and has been authorized for detection and/or diagnosis of SARS-CoV-2 by FDA under an Emergency Use Authorization (EUA). This EUA will remain in effect (meaning this test can be used) for the duration of the COVID-19 declaration under Section 564(b)(1) of the Act, 21 U.S.C. section 360bbb-3(b)(1), unless the authorization is terminated or revoked.  Performed at [facility], [place] [address], [place], [place] [address]      Medications:    Chlorhexidine Gluconate Cloth  6 each Topical Daily   feeding supplement  237 mL Oral TID BM   hydrOXYzine  50 mg Oral TID   pantoprazole  40 mg Oral Daily   sertraline  25 mg Oral Daily   sodium chloride flush  10-40 mL Intracatheter Q12H   Continuous Infusions:    LOS: 0 days   Charlynne Cousins  [facility]  04/22/2021, 8:56 AM

## 2021-04-23 DIAGNOSIS — R29898 Other symptoms and signs involving the musculoskeletal system: Secondary | ICD-10-CM | POA: Diagnosis not present

## 2021-04-23 DIAGNOSIS — E43 Unspecified severe protein-calorie malnutrition: Secondary | ICD-10-CM | POA: Diagnosis not present

## 2021-04-23 DIAGNOSIS — E876 Hypokalemia: Secondary | ICD-10-CM | POA: Diagnosis not present

## 2021-04-23 DIAGNOSIS — R8271 Bacteriuria: Secondary | ICD-10-CM | POA: Diagnosis not present

## 2021-04-23 DIAGNOSIS — I10 Essential (primary) hypertension: Secondary | ICD-10-CM | POA: Diagnosis not present

## 2021-04-23 MED ORDER — DEXAMETHASONE 4 MG PO TABS: 4.0000 mg | ORAL_TABLET | Freq: Two times a day (BID) | ORAL | 0 refills | Status: DC

## 2021-04-23 MED ORDER — HYDROCODONE-ACETAMINOPHEN 5-325 MG PO TABS
1.0000 | ORAL_TABLET | ORAL | Status: DC | PRN
  Administered 2021-04-24: 1 via ORAL
  Administered 2021-04-24: 2 via ORAL
  Filled 2021-04-23: qty 1
  Filled 2021-04-23: qty 2

## 2021-04-23 MED ORDER — PHENAZOPYRIDINE HCL 200 MG PO TABS
200.0000 mg | ORAL_TABLET | Freq: Three times a day (TID) | ORAL | Status: DC | PRN
  Administered 2021-04-24: 200 mg via ORAL
  Filled 2021-04-23 (×3): qty 1

## 2021-04-23 NOTE — TOC Progression Note (Signed)
Transition of Care [facility]) - Progression Note    Patient Details  Name: Debbie Barry MRN: [id number] Date of Birth: [birth year]  Transition of Care [facility]) CM/SW [place], [place] Phone Number: 04/23/2021, 3:01 PM  Clinical Narrative:   CSW met with patient to provide bed offers and also spoke with patient's husband over the phone. Patient and spouse are interested in [facility]. [facility] can accept. Patient's insurance is managed through [place] to obtain insurance approval. CSW to follow.    Expected Discharge Plan: [place] Barriers to Discharge: SNF Pending bed offer, Continued Medical Work up, Ship broker  Expected Discharge Plan and Services Expected Discharge Plan: [place]: [place] arrangements for the past 2 months: Single Family Home Expected Discharge Date: 04/23/21                                     Social Determinants of Health (SDOH) Interventions    Readmission Risk Interventions No flowsheet data found.

## 2021-04-23 NOTE — Plan of Care (Signed)

## 2021-04-23 NOTE — Discharge Summary (Signed)
Physician Discharge Summary  Debbie Barry ZJI:[id number] DOB: May 14, 1951 DOA: 04/19/2021  PCP: Glendale Chard, MD  Admit date: 04/19/2021 Discharge date: 04/23/2021  Admitted From: Home Disposition:  SNF  Recommendations for Outpatient Follow-up:  Follow up with Neurology in 1-2 weeks as an outpatient  for an EMG Please obtain BMP/CBC in one week  Home Health:No Equipment/Devices:None  Discharge Condition:Stable CODE STATUS:Full Diet recommendation: Heart Healthy   Brief/Interim Summary: 70 y.o. female past medical history significant for fatty liver, GERD, essential hypertension, osteomyelitis of the right toe, peripheral vascular disease, history of upper GI bleed due to peptic ulcers who was recently discharged from the hospital for gastroenteritis, iron overload and maculopapular rash she is coming in today complaining of progressive weakness both lower extremities, falls and numbness.,  She had a Foley catheter placed for urinary retention couple of weeks ago she has not had a bowel movement in the past week, and MRI of the brain was done showed no acute CVA mild chronic microvascular disease MRI of the lumbar spine nonhealed L3-L4 compression fracture, severe canal stenosis at L4-L5 with severe right and moderate foraminal stenosis, MRI of the thoracic spine show multiple mild foraminal stenosis degenerative changes and at least moderate foraminal stenosis C6-C7 and C7-T1, with a moderate left greater than right pleural effusion.  Neurosurgery was curb sided that they relate that her degenerative stenosis would not be causing any of her acute neurological symptoms.  Additionally as she has a healing compression fracture of the lumbar spine these will need to be fully healed prior to surgery.  In the meantime they recommended an LCL brace and dexamethasone taper for radicular pain  Discharge Diagnoses:  Principal Problem:   Weakness of left lower extremity Active Problems:    Essential hypertension, benign   Depression   Hypokalemia   Falls frequently   Gastritis   Protein-calorie malnutrition, severe   Asymptomatic bacteriuria   Prolonged QT interval  Frequent fall with weakness of left lower extremities: MRI of the C-spine ,thoracic spine and lumbar spine showed multiple level spondylosis with disc bulging at the cervical spine with mild spinal stenosis at the level C6-C7, MRI of the lumbar spine shows severe spinal stenosis with compression of L4-L5.  Neurology was consulted recommended to consult neurosurgery.  Neurosurgery was consulted on admission 04/20/2021 they reviewed the MRI that showed chronic degenerative stenosis which may require surgical attention in the future but not causing any acute changes or neurological dysfunction, additionally she has a healing fracture in the lumbar spine these will need to be fully healed prior to surgery.  In the meantime they also recommended a dexamethasone taper and LCO brace.  Hypokalemia: Repleted orally now resolved.  Prolonged QTC/multifocal atrial tachycardia: His potassium Greater than 4 magnesium greater than 2 she had no events on telemetry.  History of gastritis: Continue PPI and antiemetics.  Central hypertension: She is currently on no antihypertensive medication follow-up with PCP as an outpatient.  Depression: Continue sertraline.  Severe protein caloric malnutrition: Continue Ensure 3 times daily.  Urinary retention a Foley was placed prior to admission she will go home with Foley was exchanged in the hospital.  Discharge Instructions  Discharge Instructions     Diet - low sodium heart healthy   Complete by: As directed    Increase activity slowly   Complete by: As directed       Allergies as of 04/23/2021   No Known Allergies      Medication List  STOP taking these medications    DIUREX PO   triamcinolone ointment 0.5 % Commonly known as: KENALOG       TAKE  these medications    dexamethasone 4 MG tablet Commonly known as: DECADRON Take 1 tablet (4 mg total) by mouth every 12 (twelve) hours for 5 days.   feeding supplement Liqd Take 237 mLs by mouth 3 (three) times daily between meals.   hydrOXYzine 50 MG tablet Commonly known as: ATARAX/VISTARIL Take 1 tablet (50 mg total) by mouth 3 (three) times daily.   multivitamin with minerals Tabs tablet Take 1 tablet by mouth daily.   ondansetron 4 MG disintegrating tablet Commonly known as: Zofran ODT Take 1 tablet (4 mg total) by mouth every 8 (eight) hours as needed for nausea or vomiting.   pantoprazole 40 MG tablet Commonly known as: PROTONIX Take 1 tablet (40 mg total) by mouth daily.   potassium chloride SA 20 MEQ tablet Commonly known as: KLOR-CON Take 1 tablet (20 mEq total) by mouth 2 (two) times daily. What changed: when to take this   sertraline 25 MG tablet Commonly known as: ZOLOFT Take 1 tablet (25 mg total) by mouth daily.   VISINE OP Place 2 drops into both eyes daily as needed (for dry/irritated eyes).   Vitamin D3 125 MCG (5000 UT) Caps Take 5,000 Units by mouth daily.        Follow-up Information     [facility] Follow up in 2 week(s).   Why: Follow up with Dr. Narda Amber for EMG/NCS Contact information: [address], [place] [id number]               No Known Allergies  Consultations: Neurosurgery Neurology   Procedures/Studies: CT HEAD WO CONTRAST (5MM)  Result Date: 04/19/2021 CLINICAL DATA:  Neuro deficit, acute, stroke suspected EXAM: CT HEAD WITHOUT CONTRAST TECHNIQUE: Contiguous axial images were obtained from the base of the skull through the vertex without intravenous contrast. COMPARISON:  02/22/2021 FINDINGS: Brain: There is atrophy and chronic small vessel disease changes. No acute intracranial abnormality. Specifically, no hemorrhage, hydrocephalus, mass lesion, acute  infarction, or significant intracranial injury. Vascular: No hyperdense vessel or unexpected calcification. Skull: No acute calvarial abnormality. Sinuses/Orbits: No acute findings Other: None IMPRESSION: Atrophy, chronic microvascular disease. No acute intracranial abnormality. Electronically Signed   By: Rolm Baptise M.D.   On: 04/19/2021 22:52   MR BRAIN WO CONTRAST  Result Date: 04/20/2021 CLINICAL DATA:  Neuro deficit, acute, stroke suspected EXAM: MRI HEAD WITHOUT CONTRAST TECHNIQUE: Multiplanar, multiecho pulse sequences of the brain and surrounding structures were obtained without intravenous contrast. COMPARISON:  CT head10/20/2022. FINDINGS: Brain: No acute infarction, hemorrhage, hydrocephalus, extra-axial collection or mass lesion. Mild scattered T2 hyperintensities in the white matter, nonspecific but compatible with chronic microvascular disease. Mild atrophy. Vascular: Major arterial flow voids are maintained at the skull base. Skull and upper cervical spine: Normal marrow signal. Sinuses/Orbits: Clear sinuses.  Unremarkable orbits Other: Small right and trace left mastoid effusions. IMPRESSION: 1. No evidence of acute intracranial abnormality. 2. Mild chronic microvascular disease and atrophy. Electronically Signed   By: Margaretha Sheffield M.D.   On: 04/20/2021 06:55   MR CERVICAL SPINE WO CONTRAST  Result Date: 04/22/2021 CLINICAL DATA:  Motor neuron disease. Frequent falls with weakness of left lower extremities. 70 y.o. female past medical history significant for fatty liver, GERD, essential hypertension, osteomyelitis of the right toe, peripheral vascular disease, history of upper GI bleed  due to peptic ulcers who was recently discharged from the hospital for gastroenteritis, iron overload and maculopapular rash she is coming in today complaining of progressive weakness both lower extremities, falls and numbness. EXAM: MRI CERVICAL SPINE WITHOUT CONTRAST TECHNIQUE: Multiplanar,  multisequence MR imaging of the cervical spine was performed. No intravenous contrast was administered. COMPARISON:  Limited correlation made with cranial and thoracic MRI 01/18/2021. FINDINGS: Alignment: Near anatomic. There is a slight anterolisthesis at C4-5 and C7-T1. Vertebrae: No acute or suspicious osseous findings. Cord: Normal in signal and caliber. Posterior Fossa, vertebral arteries, paraspinal tissues: Visualized portions of the posterior fossa appear unremarkable. No significant paraspinal findings. Bilateral vertebral artery flow voids. Disc levels: C2-3: Mild disc bulging with moderate bilateral facet hypertrophy. The central spinal canal is widely patent. Moderate right and mild left foraminal narrowing. C3-4: Mild disc bulging with bilateral uncinate spurring. Moderate to severe bilateral facet hypertrophy, worse on the right. The CSF surrounding the cord is partially effaced without cord deformity. Moderate to severe right and mild left foraminal narrowing. C4-5: There is loss of disc height with annular disc bulging and uncinate spurring. Moderate to severe facet hypertrophy bilaterally. The CSF surrounding the cord is effaced without cord deformity. Moderate to severe foraminal narrowing bilaterally which could affect either C5 nerve root. C5-6: Spondylosis with loss of disc height and posterior osteophytes asymmetric the right. Milder facet degenerative changes at this level. No cord deformity. Moderate to severe right and mild left foraminal narrowing. C6-7: Spondylosis with loss of disc height and posterior osteophytes asymmetric to the right. The CSF surrounding the cord is partially effaced with mild right-sided cord flattening. Moderate to severe right and moderate left foraminal narrowing. C7-T1: Mild loss of disc height with disc bulging, uncinate spurring and bilateral facet hypertrophy. The CSF surrounding the cord is partially effaced without cord deformity. Moderate to severe  foraminal narrowing bilaterally. The visualized upper thoracic spine appears unchanged from the recent thoracic MRI. IMPRESSION: 1. Multilevel cervical spondylosis with disc bulging, uncinate spurring and facet hypertrophy as described. 2. There is mild central spinal stenosis at multiple levels as detailed above. There is mild cord flattening on the right at C6-7. No abnormal cord signal. 3. Multilevel significant osseous foraminal narrowing which could affect the exiting nerve roots. 4. No acute findings demonstrated. Electronically Signed   By: Richardean Sale M.D.   On: 04/22/2021 10:52   MR THORACIC SPINE WO CONTRAST  Result Date: 04/20/2021 CLINICAL DATA:  Ataxia, nontraumatic, T-spine pathology suspected; Low back pain, cauda equina syndrome suspected EXAM: MRI THORACIC AND LUMBAR SPINE WITHOUT CONTRAST TECHNIQUE: Multiplanar and multiecho pulse sequences of the thoracic and lumbar spine were obtained without intravenous contrast. COMPARISON:  CT lumbar spine 02/22/2021. FINDINGS: MRI THORACIC SPINE FINDINGS Alignment:  Slight anterolisthesis of T1 on T2 and T2 on T3. Vertebrae: Vertebral body heights are maintained.Multilevel degenerative/discogenic endplate signal changes, greatest at T9-T10 where there is disc height loss. Otherwise, no focal marrow edema to suggest acute fracture or discitis/osteomyelitis. Heterogeneous bone marrow without suspicious bone lesion. Cord:  Normal cord signal. Paraspinal and other soft tissues: Moderate left greater than right pleural effusions. Disc levels: Disc bulges and ligamentum flavum thickening at multiple levels throughout the imaged lower cervical and thoracic spine. Although partially imaged on sagittal imaging, probably mild to moderate canal stenosis and potentially at least moderate foraminal stenosis at C6-C7 and C7-T1. Mild canal stenosis at T5-T6, T7-T8, and T10-T11. Multilevel facet arthropathy. Foraminal stenosis is greatest and moderate on the right  at T10-T11. Otherwise, multilevel mild foraminal stenosis in the thoracic spine. MRI LUMBAR SPINE FINDINGS Segmentation:  Standard Alignment: Similar grade 1 anterolisthesis of L2 on L3, L3 on L4, and L4 on L5. Vertebrae: Redemonstrated L3 and L4 compression fractures with similar height loss (35% L3 and 50% at L4). Edema within the superior endplates at these levels suggests that the fractures are unhealed. Edema within the L4 posterior elements. No substantial retropulsion. Conus medullaris and cauda equina: Conus extends to the T12-L1 level. Conus appears normal. Paraspinal and other soft tissues: Paraspinal muscular atrophy. Disc levels: T12-L1: No significant disc protrusion, foraminal stenosis, or canal stenosis. L1-L2: No significant disc protrusion, foraminal stenosis, or canal stenosis. L2-L3: Grade 1 anterolisthesis. Left eccentric disc bulge, bulky ligamentum flavum thickening and mild facet arthropathy. Prominent dorsal epidural fat. Resulting moderate canal stenosis without significant foraminal stenosis. L3-L4: Grade 1 anterolisthesis. Left eccentric disc bulge with bulky ligamentum flavum thickening and moderate bilateral facet arthropathy. Prominent dorsal epidural fat. Resulting moderate to severe canal stenosis without significant foraminal stenosis. L4-L5: Grade 1 anterolisthesis. Uncovering the disc with uncovering the disc and superimposed broad disc bulge. Severe bilateral facet arthropathy with bulky ligamentum flavum thickening. Resulting severe canal stenosis. Severe right and moderate left foraminal stenosis. L5-S1: Mild broad disc bulge and endplate spurring with mild-to-moderate bilateral facet arthropathy. Resulting mild bilateral foraminal stenosis and mild bilateral subarticular recess stenosis without significant canal stenosis. IMPRESSION: MR LUMBAR SPINE IMPRESSION 1. Unhealed L3 and L4 compression fractures with similar height loss and marrow edema along the superior endplates.  Edema within the L4 posterior elements could be posttraumatic or degenerative in etiology. 2. At L4-L5, severe canal stenosis with severe right and moderate left foraminal stenosis. 3. Moderate to severe canal stenosis at L3-L4 and moderate canal stenosis at L2-L3. MR THORACIC SPINE IMPRESSION 1. Moderate foraminal stenosis on the right at T10-T11. Otherwise, multilevel mild foraminal stenosis in the thoracic spine. 2. Multilevel degenerative disc disease and mild canal stenosis in the thoracic spine. 3. Potentially mild-to-moderate canal stenosis and at least moderate foraminal stenosis C6-C7 and C7-T1, partially only imaged on sagittal and incompletely assessed. Dedicated MRI of the cervical spine could further characterize if clinically indicated. 4. Moderate left greater than right pleural effusions. Consider dedicated chest imaging. Electronically Signed   By: Margaretha Sheffield M.D.   On: 04/20/2021 07:46   MR LUMBAR SPINE WO CONTRAST  Result Date: 04/20/2021 CLINICAL DATA:  Ataxia, nontraumatic, T-spine pathology suspected; Low back pain, cauda equina syndrome suspected EXAM: MRI THORACIC AND LUMBAR SPINE WITHOUT CONTRAST TECHNIQUE: Multiplanar and multiecho pulse sequences of the thoracic and lumbar spine were obtained without intravenous contrast. COMPARISON:  CT lumbar spine 02/22/2021. FINDINGS: MRI THORACIC SPINE FINDINGS Alignment:  Slight anterolisthesis of T1 on T2 and T2 on T3. Vertebrae: Vertebral body heights are maintained.Multilevel degenerative/discogenic endplate signal changes, greatest at T9-T10 where there is disc height loss. Otherwise, no focal marrow edema to suggest acute fracture or discitis/osteomyelitis. Heterogeneous bone marrow without suspicious bone lesion. Cord:  Normal cord signal. Paraspinal and other soft tissues: Moderate left greater than right pleural effusions. Disc levels: Disc bulges and ligamentum flavum thickening at multiple levels throughout the imaged lower  cervical and thoracic spine. Although partially imaged on sagittal imaging, probably mild to moderate canal stenosis and potentially at least moderate foraminal stenosis at C6-C7 and C7-T1. Mild canal stenosis at T5-T6, T7-T8, and T10-T11. Multilevel facet arthropathy. Foraminal stenosis is greatest and moderate on the right at T10-T11. Otherwise, multilevel mild foraminal stenosis  in the thoracic spine. MRI LUMBAR SPINE FINDINGS Segmentation:  Standard Alignment: Similar grade 1 anterolisthesis of L2 on L3, L3 on L4, and L4 on L5. Vertebrae: Redemonstrated L3 and L4 compression fractures with similar height loss (35% L3 and 50% at L4). Edema within the superior endplates at these levels suggests that the fractures are unhealed. Edema within the L4 posterior elements. No substantial retropulsion. Conus medullaris and cauda equina: Conus extends to the T12-L1 level. Conus appears normal. Paraspinal and other soft tissues: Paraspinal muscular atrophy. Disc levels: T12-L1: No significant disc protrusion, foraminal stenosis, or canal stenosis. L1-L2: No significant disc protrusion, foraminal stenosis, or canal stenosis. L2-L3: Grade 1 anterolisthesis. Left eccentric disc bulge, bulky ligamentum flavum thickening and mild facet arthropathy. Prominent dorsal epidural fat. Resulting moderate canal stenosis without significant foraminal stenosis. L3-L4: Grade 1 anterolisthesis. Left eccentric disc bulge with bulky ligamentum flavum thickening and moderate bilateral facet arthropathy. Prominent dorsal epidural fat. Resulting moderate to severe canal stenosis without significant foraminal stenosis. L4-L5: Grade 1 anterolisthesis. Uncovering the disc with uncovering the disc and superimposed broad disc bulge. Severe bilateral facet arthropathy with bulky ligamentum flavum thickening. Resulting severe canal stenosis. Severe right and moderate left foraminal stenosis. L5-S1: Mild broad disc bulge and endplate spurring with  mild-to-moderate bilateral facet arthropathy. Resulting mild bilateral foraminal stenosis and mild bilateral subarticular recess stenosis without significant canal stenosis. IMPRESSION: MR LUMBAR SPINE IMPRESSION 1. Unhealed L3 and L4 compression fractures with similar height loss and marrow edema along the superior endplates. Edema within the L4 posterior elements could be posttraumatic or degenerative in etiology. 2. At L4-L5, severe canal stenosis with severe right and moderate left foraminal stenosis. 3. Moderate to severe canal stenosis at L3-L4 and moderate canal stenosis at L2-L3. MR THORACIC SPINE IMPRESSION 1. Moderate foraminal stenosis on the right at T10-T11. Otherwise, multilevel mild foraminal stenosis in the thoracic spine. 2. Multilevel degenerative disc disease and mild canal stenosis in the thoracic spine. 3. Potentially mild-to-moderate canal stenosis and at least moderate foraminal stenosis C6-C7 and C7-T1, partially only imaged on sagittal and incompletely assessed. Dedicated MRI of the cervical spine could further characterize if clinically indicated. 4. Moderate left greater than right pleural effusions. Consider dedicated chest imaging. Electronically Signed   By: Margaretha Sheffield M.D.   On: 04/20/2021 07:46   CT ABDOMEN PELVIS W CONTRAST  Result Date: 03/31/2021 CLINICAL DATA:  Acute abdominal pain. EXAM: CT ABDOMEN AND PELVIS WITH CONTRAST TECHNIQUE: Multidetector CT imaging of the abdomen and pelvis was performed using the standard protocol following bolus administration of intravenous contrast. CONTRAST:  71mL OMNIPAQUE IOHEXOL 350 MG/ML SOLN COMPARISON:  Abdominal ultrasound dated 10/27/2015. FINDINGS: Lower chest: There is mild bibasilar atelectasis. The heart is enlarged. Hepatobiliary: No focal liver abnormality is seen. No gallstones, gallbladder wall thickening, or biliary dilatation. Pancreas: Unremarkable. No pancreatic ductal dilatation or surrounding inflammatory changes.  Spleen: Normal in size without focal abnormality. Adrenals/Urinary Tract: Adrenal glands are unremarkable. Kidneys are normal, without renal calculi, focal lesion, or hydronephrosis. Bladder is unremarkable. Stomach/Bowel: There is edema and mild thickening of the wall of the gastric fundus with hyperenhancement of the mucosa which may represent gastritis. Appendix appears normal. There is colonic diverticulosis without evidence of diverticulitis. No evidence of bowel wall thickening, distention, or inflammatory changes. Vascular/Lymphatic: Aortic atherosclerosis. No enlarged abdominal or pelvic lymph nodes. Reproductive: A mass in the left aspect of the uterus measures 3.0 x 2.5 cm. The adnexa appear normal. Other: No abdominal wall hernia or abnormality. No abdominopelvic  ascites. Anasarca and diffuse muscle wasting are noted. Musculoskeletal: There are chronic appearing compression deformities with approximately 25% height loss at L3 and 50% height loss at L4. Degenerative changes are seen in the spine. IMPRESSION: 1. Findings which may reflect gastritis. 2. Mass in the left aspect of the uterus likely represents a uterine fibroid. 3. Chronic appearing compression deformities of L3 and L4. Aortic Atherosclerosis (ICD10-I70.0). Electronically Signed   By: Zerita Boers M.D.   On: 03/31/2021 16:18   VAS [place] ABI WITH/WO TBI  Result Date: 04/02/2021  LOWER EXTREMITY DOPPLER STUDY Patient Name:  Debbie Barry  Date of Exam:   04/02/2021 Medical Rec #: [id number]           Accession #:    [id number] Date of Birth: 18-Oct-1950          Patient Gender: F Patient Age:   70 years Exam Location:  [facility] Procedure:      VAS [place] ABI WITH/WO TBI Referring Phys: RALPH NETTEY --------------------------------------------------------------------------------  Indications: Peripheral artery disease. High Risk Factors: Hypertension.  Comparison Study: No prior studies. Performing Technologist: Carlos Levering RVT   Examination Guidelines: A complete evaluation includes at minimum, Doppler waveform signals and systolic blood pressure reading at the level of bilateral brachial, anterior tibial, and posterior tibial arteries, when vessel segments are accessible. Bilateral testing is considered an integral part of a complete examination. Photoelectric Plethysmograph (PPG) waveforms and toe systolic pressure readings are included as required and additional duplex testing as needed. Limited examinations for reoccurring indications may be performed as noted.  ABI Findings: +---------+------------------+-----+---------+--------+ Right    Rt Pressure (mmHg)IndexWaveform Comment  +---------+------------------+-----+---------+--------+ Brachial 135                    triphasic         +---------+------------------+-----+---------+--------+ PTA      122               0.90 biphasic          +---------+------------------+-----+---------+--------+ DP       116               0.86 biphasic          +---------+------------------+-----+---------+--------+ Great Toe47                0.35                   +---------+------------------+-----+---------+--------+ +---------+------------------+-----+---------+-------+ Left     Lt Pressure (mmHg)IndexWaveform Comment +---------+------------------+-----+---------+-------+ Brachial 130                    triphasic        +---------+------------------+-----+---------+-------+ PTA      162               1.20 biphasic         +---------+------------------+-----+---------+-------+ DP       165               1.22 triphasic        +---------+------------------+-----+---------+-------+ Great Toe59                0.44                  +---------+------------------+-----+---------+-------+ +-------+-----------+-----------+------------+------------+ ABI/TBIToday's ABIToday's TBIPrevious ABIPrevious TBI  +-------+-----------+-----------+------------+------------+ Right  0.9        0.35                                +-------+-----------+-----------+------------+------------+  Left   1.22       0.44                                +-------+-----------+-----------+------------+------------+  Summary: Right: Resting right ankle-brachial index indicates mild right lower extremity arterial disease. The right toe-brachial index is abnormal. Left: Resting left ankle-brachial index is within normal range. No evidence of significant left lower extremity arterial disease. The left toe-brachial index is abnormal.  *See table(s) above for measurements and observations.  Electronically signed by Servando Snare MD on 04/02/2021 at 4:52:40 PM.    Final    ECHOCARDIOGRAM COMPLETE  Result Date: 04/21/2021    ECHOCARDIOGRAM REPORT   Patient Name:   Debbie Barry Date of Exam: 04/21/2021 Medical Rec #:  [id number]          Height:       61.0 in Accession #:    [id number]         Weight:       117.0 lb Date of Birth:  10/05/1950         BSA:          1.504 m Patient Age:    42 years           BP:           132/80 mmHg Patient Gender: F                  HR:           100 bpm. Exam Location:  Inpatient Procedure: 2D Echo Indications:    Chest pain. Abnormal ecg.  History:        Patient has no prior history of Echocardiogram examinations.                 Pleural effusion; Risk Factors:Hypertension.  Sonographer:    Johny Chess RDCS Referring Phys: [id number] [place]  1. Left ventricular ejection fraction, by estimation, is 45 to 50%. The left ventricle has low normal function. The left ventricle demonstrates regional wall motion abnormalities (see scoring diagram/findings for description). Left ventricular diastolic  parameters are consistent with Grade I diastolic dysfunction (impaired relaxation).  2. Right ventricular systolic function is normal. The right ventricular size is normal.  Tricuspid regurgitation signal is inadequate for assessing PA pressure.  3. Large pleural effusion in the left lateral region.  4. The mitral valve is normal in structure. Trivial mitral valve regurgitation. No evidence of mitral stenosis.  5. The aortic valve is tricuspid. There is mild calcification of the aortic valve. There is mild thickening of the aortic valve. Aortic valve regurgitation is not visualized. No aortic stenosis is present.  6. The inferior vena cava is normal in size with greater than 50% respiratory variability, suggesting right atrial pressure of 3 mmHg. FINDINGS  Left Ventricle: The anteroseptal wall and apex appear hypokinetic. Left ventricular ejection fraction, by estimation, is 45 to 50%. The left ventricle has low normal function. The left ventricle demonstrates regional wall motion abnormalities. The left ventricular internal cavity size was normal in size. There is no left ventricular hypertrophy. Left ventricular diastolic parameters are consistent with Grade I diastolic dysfunction (impaired relaxation). Normal left ventricular filling pressure. Right Ventricle: The right ventricular size is normal. No increase in right ventricular wall thickness. Right ventricular systolic function is normal. Tricuspid regurgitation signal is inadequate for assessing PA pressure. Left Atrium: Left atrial size was  normal in size. Right Atrium: Right atrial size was normal in size. Pericardium: There is no evidence of pericardial effusion. Mitral Valve: The mitral valve is normal in structure. Trivial mitral valve regurgitation. No evidence of mitral valve stenosis. Tricuspid Valve: The tricuspid valve is normal in structure. Tricuspid valve regurgitation is not demonstrated. No evidence of tricuspid stenosis. Aortic Valve: The aortic valve is tricuspid. There is mild calcification of the aortic valve. There is mild thickening of the aortic valve. There is mild aortic valve annular calcification.  Aortic valve regurgitation is not visualized. No aortic stenosis  is present. Aortic valve mean gradient measures 2.5 mmHg. Aortic valve peak gradient measures 5.6 mmHg. Aortic valve area, by VTI measures 2.19 cm. Pulmonic Valve: The pulmonic valve was not well visualized. Pulmonic valve regurgitation is not visualized. No evidence of pulmonic stenosis. Aorta: The aortic root is normal in size and structure. Venous: The inferior vena cava is normal in size with greater than 50% respiratory variability, suggesting right atrial pressure of 3 mmHg. IAS/Shunts: No atrial level shunt detected by color flow Doppler. Additional Comments: There is a large pleural effusion in the left lateral region.  LEFT VENTRICLE PLAX 2D LVIDd:         4.10 cm     Diastology LVIDs:         3.20 cm     LV e' medial:    6.20 cm/s LV PW:         0.90 cm     LV E/e' medial:  7.6 LV IVS:        0.80 cm     LV e' lateral:   5.98 cm/s LVOT diam:     2.00 cm     LV E/e' lateral: 7.9 LV SV:         46 LV SV Index:   30 LVOT Area:     3.14 cm  LV Volumes (MOD) LV vol d, MOD A2C: 50.8 ml LV vol d, MOD A4C: 68.8 ml LV vol s, MOD A2C: 28.6 ml LV vol s, MOD A4C: 45.8 ml LV SV MOD A2C:     22.2 ml LV SV MOD A4C:     68.8 ml LV SV MOD BP:      21.8 ml RIGHT VENTRICLE             IVC RV S prime:     14.70 cm/s  IVC diam: 1.00 cm TAPSE (M-mode): 1.6 cm LEFT ATRIUM             Index        RIGHT ATRIUM           Index LA diam:        2.40 cm 1.60 cm/m   RA Area:     12.10 cm LA Vol (A2C):   23.4 ml 15.56 ml/m  RA Volume:   27.10 ml  18.02 ml/m LA Vol (A4C):   45.2 ml 30.06 ml/m LA Biplane Vol: 34.7 ml 23.07 ml/m  AORTIC VALVE AV Area (Vmax):    2.47 cm AV Area (Vmean):   2.70 cm AV Area (VTI):     2.19 cm AV Vmax:           118.62 cm/s AV Vmean:          70.876 cm/s AV VTI:            0.210 m AV Peak Grad:      5.6 mmHg AV Mean Grad:  2.5 mmHg LVOT Vmax:         93.40 cm/s LVOT Vmean:        60.900 cm/s LVOT VTI:          0.146 m LVOT/AV VTI  ratio: 0.70  AORTA Ao Root diam: 2.70 cm Ao Asc diam:  2.90 cm MV E velocity: 47.40 cm/s MV A velocity: 94.70 cm/s  SHUNTS MV E/A ratio:  0.50        Systemic VTI:  0.15 m                            Systemic Diam: 2.00 cm Carlyle Dolly MD Electronically signed by Carlyle Dolly MD Signature Date/Time: 04/21/2021/11:55:23 AM    Final    [place] EKG SITE RITE  Result Date: 04/01/2021 If Site Rite image not attached, placement could not be confirmed due to current cardiac rhythm.  (Echo, Carotid, EGD, Colonoscopy, ERCP)    Subjective: No complaints  Discharge Exam: Vitals:   04/23/21 0015 04/23/21 0459  BP: 127/83 126/80  Pulse: 98 91  Resp: 16 16  Temp: 97.9 F (36.6 C) 98 F (36.7 C)  SpO2: 97% 97%   Vitals:   04/22/21 1546 04/22/21 2050 04/23/21 0015 04/23/21 0459  BP: 132/83 130/79 127/83 126/80  Pulse: 100 (!) 105 98 91  Resp: 18 16 16 16   Temp: 97.9 F (36.6 C) 98.2 F (36.8 C) 97.9 F (36.6 C) 98 F (36.7 C)  TempSrc: Oral Oral Oral Oral  SpO2: 96% 97% 97% 97%  Weight:      Height:        General: Pt is alert, awake, not in acute distress Cardiovascular: RRR, S1/S2 +, no rubs, no gallops Respiratory: CTA bilaterally, no wheezing, no rhonchi Abdominal: Soft, NT, ND, bowel sounds + Extremities: no edema, no cyanosis    The results of significant diagnostics from this hospitalization (including imaging, microbiology, ancillary and laboratory) are listed below for reference.     Microbiology: Recent Results (from the past 240 hour(s))  Resp Panel by RT-PCR (Flu A&B, Covid) Nasopharyngeal Swab     Status: None   Collection Time: 04/19/21 10:51 PM   Specimen: Nasopharyngeal Swab; Nasopharyngeal(NP) swabs in vial transport medium  Result Value Ref Range Status   SARS Coronavirus 2 by RT PCR NEGATIVE NEGATIVE Final    Comment: (NOTE) SARS-CoV-2 target nucleic acids are NOT DETECTED.  The SARS-CoV-2 RNA is generally detectable in upper respiratory specimens during  the acute phase of infection. The lowest concentration of SARS-CoV-2 viral copies this assay can detect is 138 copies/mL. A negative result does not preclude SARS-Cov-2 infection and should not be used as the sole basis for treatment or other patient management decisions. A negative result may occur with  improper specimen collection/handling, submission of specimen other than nasopharyngeal swab, presence of viral mutation(s) within the areas targeted by this assay, and inadequate number of viral copies(<138 copies/mL). A negative result must be combined with clinical observations, patient history, and epidemiological information. The expected result is Negative.  Fact Sheet for Patients:  EntrepreneurPulse.com.au  Fact Sheet for Healthcare Providers:  IncredibleEmployment.be  This test is no t yet approved or cleared by the [place] FDA and  has been authorized for detection and/or diagnosis of SARS-CoV-2 by FDA under an Emergency Use Authorization (EUA). This EUA will remain  in effect (meaning this test can be used) for the duration of the COVID-19 declaration under Section 564(b)(1)  of the Act, 21 U.S.C.section 360bbb-3(b)(1), unless the authorization is terminated  or revoked sooner.       Influenza A by PCR NEGATIVE NEGATIVE Final   Influenza B by PCR NEGATIVE NEGATIVE Final    Comment: (NOTE) The Xpert Xpress SARS-CoV-2/FLU/RSV plus assay is intended as an aid in the diagnosis of influenza from Nasopharyngeal swab specimens and should not be used as a sole basis for treatment. Nasal washings and aspirates are unacceptable for Xpert Xpress SARS-CoV-2/FLU/RSV testing.  Fact Sheet for Patients: EntrepreneurPulse.com.au  Fact Sheet for Healthcare Providers: IncredibleEmployment.be  This test is not yet approved or cleared by the [place] FDA and has been authorized for detection and/or  diagnosis of SARS-CoV-2 by FDA under an Emergency Use Authorization (EUA). This EUA will remain in effect (meaning this test can be used) for the duration of the COVID-19 declaration under Section 564(b)(1) of the Act, 21 U.S.C. section 360bbb-3(b)(1), unless the authorization is terminated or revoked.  Performed at [facility], [place] [address] [place], [place] [address]      Labs: BNP (last 3 results) No results for input(s): BNP in the last 8760 hours. Basic Metabolic Panel: Recent Labs  Lab 04/20/21 0111 04/20/21 0120 04/22/21 0349  NA 138 139 136  K 3.1* 3.5 3.7  CL 101 103 103  CO2  --  25 27  GLUCOSE 76 79 78  BUN 9 10 9   CREATININE 0.50 0.71 0.70  CALCIUM  --  7.7* 7.5*  MG  --   --  1.5*   Liver Function Tests: Recent Labs  Lab 04/20/21 0120  AST 44*  ALT 26  ALKPHOS 167*  BILITOT 1.3*  PROT 5.3*  ALBUMIN 1.8*   No results for input(s): LIPASE, AMYLASE in the last 168 hours. No results for input(s): AMMONIA in the last 168 hours. CBC: Recent Labs  Lab 04/20/21 0100 04/20/21 0111  WBC 7.2  --   NEUTROABS 5.4  --   HGB 13.6 15.3*  HCT 38.2 45.0  MCV 95.3  --   PLT 260  --    Cardiac Enzymes: No results for input(s): CKTOTAL, CKMB, CKMBINDEX, TROPONINI in the last 168 hours. BNP: Invalid input(s): POCBNP CBG: No results for input(s): GLUCAP in the last 168 hours. D-Dimer No results for input(s): DDIMER in the last 72 hours. Hgb A1c No results for input(s): HGBA1C in the last 72 hours. Lipid Profile No results for input(s): CHOL, HDL, LDLCALC, TRIG, CHOLHDL, LDLDIRECT in the last 72 hours. Thyroid function studies No results for input(s): TSH, T4TOTAL, T3FREE, THYROIDAB in the last 72 hours.  Invalid input(s): FREET3 Anemia work up No results for input(s): VITAMINB12, FOLATE, FERRITIN, TIBC, IRON, RETICCTPCT in the last 72 hours. Urinalysis    Component Value Date/Time   COLORURINE AMBER (A) 04/19/2021 2251    APPEARANCEUR TURBID (A) 04/19/2021 2251   LABSPEC 1.025 04/19/2021 2251   PHURINE 6.0 04/19/2021 2251   GLUCOSEU 100 (A) 04/19/2021 2251   HGBUR LARGE (A) 04/19/2021 2251   BILIRUBINUR MODERATE (A) 04/19/2021 2251   BILIRUBINUR negative 03/30/2020 1215   KETONESUR NEGATIVE 04/19/2021 2251   PROTEINUR 30 (A) 04/19/2021 2251   UROBILINOGEN 0.2 03/30/2020 1215   NITRITE POSITIVE (A) 04/19/2021 2251   LEUKOCYTESUR MODERATE (A) 04/19/2021 2251   Sepsis Labs Invalid input(s): PROCALCITONIN,  WBC,  LACTICIDVEN Microbiology Recent Results (from the past 240 hour(s))  Resp Panel by RT-PCR (Flu A&B, Covid) Nasopharyngeal Swab     Status: None  Collection Time: 04/19/21 10:51 PM   Specimen: Nasopharyngeal Swab; Nasopharyngeal(NP) swabs in vial transport medium  Result Value Ref Range Status   SARS Coronavirus 2 by RT PCR NEGATIVE NEGATIVE Final    Comment: (NOTE) SARS-CoV-2 target nucleic acids are NOT DETECTED.  The SARS-CoV-2 RNA is generally detectable in upper respiratory specimens during the acute phase of infection. The lowest concentration of SARS-CoV-2 viral copies this assay can detect is 138 copies/mL. A negative result does not preclude SARS-Cov-2 infection and should not be used as the sole basis for treatment or other patient management decisions. A negative result may occur with  improper specimen collection/handling, submission of specimen other than nasopharyngeal swab, presence of viral mutation(s) within the areas targeted by this assay, and inadequate number of viral copies(<138 copies/mL). A negative result must be combined with clinical observations, patient history, and epidemiological information. The expected result is Negative.  Fact Sheet for Patients:  EntrepreneurPulse.com.au  Fact Sheet for Healthcare Providers:  IncredibleEmployment.be  This test is no t yet approved or cleared by the [place] FDA and  has been  authorized for detection and/or diagnosis of SARS-CoV-2 by FDA under an Emergency Use Authorization (EUA). This EUA will remain  in effect (meaning this test can be used) for the duration of the COVID-19 declaration under Section 564(b)(1) of the Act, 21 U.S.C.section 360bbb-3(b)(1), unless the authorization is terminated  or revoked sooner.       Influenza A by PCR NEGATIVE NEGATIVE Final   Influenza B by PCR NEGATIVE NEGATIVE Final    Comment: (NOTE) The Xpert Xpress SARS-CoV-2/FLU/RSV plus assay is intended as an aid in the diagnosis of influenza from Nasopharyngeal swab specimens and should not be used as a sole basis for treatment. Nasal washings and aspirates are unacceptable for Xpert Xpress SARS-CoV-2/FLU/RSV testing.  Fact Sheet for Patients: EntrepreneurPulse.com.au  Fact Sheet for Healthcare Providers: IncredibleEmployment.be  This test is not yet approved or cleared by the [place] FDA and has been authorized for detection and/or diagnosis of SARS-CoV-2 by FDA under an Emergency Use Authorization (EUA). This EUA will remain in effect (meaning this test can be used) for the duration of the COVID-19 declaration under Section 564(b)(1) of the Act, 21 U.S.C. section 360bbb-3(b)(1), unless the authorization is terminated or revoked.  Performed at [facility], [place] [address] [place], [place] [address]     SIGNED:   Charlynne Cousins, MD  [facility] 04/23/2021, 8:07 AM Pager   If 7PM-7AM, please contact night-coverage [email or web address] Password [email or web address]

## 2021-04-24 DIAGNOSIS — E43 Unspecified severe protein-calorie malnutrition: Secondary | ICD-10-CM | POA: Diagnosis not present

## 2021-04-24 DIAGNOSIS — R29898 Other symptoms and signs involving the musculoskeletal system: Secondary | ICD-10-CM | POA: Diagnosis not present

## 2021-04-24 DIAGNOSIS — I10 Essential (primary) hypertension: Secondary | ICD-10-CM | POA: Diagnosis not present

## 2021-04-24 DIAGNOSIS — R8271 Bacteriuria: Secondary | ICD-10-CM | POA: Diagnosis not present

## 2021-04-24 DIAGNOSIS — E876 Hypokalemia: Secondary | ICD-10-CM | POA: Diagnosis not present

## 2021-04-24 LAB — URINALYSIS, ROUTINE W REFLEX MICROSCOPIC
Bacteria, UA: NONE SEEN
Bilirubin Urine: NEGATIVE
Glucose, UA: NEGATIVE mg/dL
Ketones, ur: 5 mg/dL — AB
Nitrite: NEGATIVE
Protein, ur: 100 mg/dL — AB
Specific Gravity, Urine: 1.023 (ref 1.005–1.030)
WBC, UA: >50 WBC/hpf — ABNORMAL HIGH (ref 0–5)
pH: 6 (ref 5.0–8.0)

## 2021-04-24 MED ORDER — SODIUM CHLORIDE 0.9 % IV SOLN: INTRAVENOUS | Status: DC

## 2021-04-24 NOTE — Progress Notes (Signed)
Physical Therapy Treatment Patient Details Name: Debbie Barry MRN: [id number] DOB: 06-Jun-1951 Today's Date: 04/24/2021   History of Present Illness Pt is a 70 y/o female admitted 10/20 secondary to increased BLE weakness (LLE>RLE. Imaging showed chronic L3-4 compression fx and L4-5 severe canal stenosis. Per neurosurgery notes, to be managed conservatively for now and brace ordered by MD 10/24 (not in room 10/25, RN notified). PMH: fatty liver, GERD, essential HTN, osteomyelitis of the right toe, PVD, history of upper GI bleed due to peptic ulcers who was recently discharged from the hospital for gastroenteritis, iron overload and maculopapular rash.    PT Comments    Pt received in supine, agreeable to therapy session and with good participation and fair tolerance for functional mobility tasks today. Pt limited due to dizziness with transition to seated/standing postures, tachycardia and episode nausea/vomiting after standing at bedside. Of note, TLSO brace ordered previous date by MD but not in room so ortho tech notified and RN aware, will need TLSO brace to progress functional mobility safely away from bed due to poor seated/standing posture. Emphasis on seated balance tasks, supine LE HEP, importance of log rolling/back precautions (needs handout for back precs next session) and positioning for edema reduction (LUE noted to have increased edema). Pt continues to benefit from PT services to progress toward functional mobility goals. Continue to recommend SNF.   Recommendations for follow up therapy are one component of a multi-disciplinary discharge planning process, led by the attending physician.  Recommendations may be updated based on patient status, additional functional criteria and insurance authorization.  Follow Up Recommendations  Skilled nursing-short term rehab (<3 hours/day)     Assistance Recommended at Discharge Frequent or constant Supervision/Assistance  Equipment  Recommendations  Wheelchair cushion (measurements PT);Wheelchair (measurements PT);Hospital bed    Recommendations for Other Services       Precautions / Restrictions Precautions Precautions: Fall Precaution Comments: monitor HR, back precs Required Braces or Orthoses: Other Brace Other Brace: TLSO brace ordered 10/24 per chart review but not in room during session, RN notified Restrictions Weight Bearing Restrictions: No     Mobility  Bed Mobility Overal bed mobility: Needs Assistance Bed Mobility: Rolling;Sidelying to Sit;Sit to Sidelying Rolling: Mod assist Sidelying to sit: Mod assist;+2 for physical assistance     Sit to sidelying: Mod assist;+2 for physical assistance;HOB elevated General bed mobility comments: Mod A +2 for trunk and LE assist, cues for log roll sequencing and reasoning for this, pt receptive to instruction but with poor initiation/technique each time she rolled; x4 total rolls    Transfers Overall transfer level: Needs assistance Equipment used: Rolling walker (2 wheels) Transfers: Sit to/from Stand Sit to Stand: Mod assist;+2 safety/equipment;Max assist;From elevated surface           General transfer comment: Pt needing max cues and physical assist for upright posture and to keep head up. Very flexed posture; defer gait progression/pivot transfers as no brace in room and pt tachy with episode n/v after standing while seated EOB    Ambulation/Gait             General Gait Details: NT; defer for safety 2/2 elevated HR and dizziness with n/v episode   Stairs             Wheelchair Mobility    Modified Rankin (Stroke Patients Only)       Balance Overall balance assessment: Needs assistance Sitting-balance support: Feet supported Sitting balance-Leahy Scale: Poor Sitting balance - Comments: pt fatigues quickly  needs initally close Supervision but progressed to needing min/modA for trunk support due to R lean and pt propping on  elbow or unsafely leaning anterior with severe cervical flexion as fatigue increased Postural control: Right lateral lean Standing balance support: Bilateral upper extremity supported;During functional activity Standing balance-Leahy Scale: Poor Standing balance comment: Reliant on UE and external support, crouched posture and with downward gaze, pt unable to improve posture significantly even with multimodal cues                            Cognition Arousal/Alertness: Awake/alert Behavior During Therapy: WFL for tasks assessed/performed Overall Cognitive Status: No family/caregiver present to determine baseline cognitive functioning                                 General Comments: pt tending to ignore some postural cues and with very slouched/anterior lean posture while seated and seemed less aware of fall risk and seated safety/did not express anxiety about this today.        Exercises General Exercises - Lower Extremity Ankle Circles/Pumps: AROM;Both;10 reps;Supine Heel Slides: AROM;AAROM;Both;10 reps;Supine Hip ABduction/ADduction: AROM;AAROM;Both;10 reps;Supine Seated A/AAROM: lateral leans with elbow taps with focus on core activation x5 reps   General Comments General comments (skin integrity, edema, etc.): HR 108 bpm resting, 120 bpm seated and >145 bpm standing; BP 122/93 seated and 121/69 standing, pt dizzy and episode of n/v while seated EOB after standing, RN/MD notified of unstable vitals/tachycardia; pt without TLSO brace in room, ortho tech called and message left      Pertinent Vitals/Pain Pain Assessment: Faces Faces Pain Scale: Hurts a little bit Pain Location: stomach Pain Descriptors / Indicators: Aching Pain Intervention(s): Monitored during session;Repositioned;Limited activity within patient's tolerance (log roll due to back precs)     PT Goals (current goals can now be found in the care plan section) Acute Rehab PT Goals Patient  Stated Goal: to get stronger before going home PT Goal Formulation: With patient Time For Goal Achievement: 05/06/21 Progress towards PT goals: Progressing toward goals (slow progress)    Frequency    Min 2X/week      PT Plan Current plan remains appropriate       AM-PAC PT "6 Clicks" Mobility   Outcome Measure  Help needed turning from your back to your side while in a flat bed without using bedrails?: A Lot Help needed moving from lying on your back to sitting on the side of a flat bed without using bedrails?: A Lot Help needed moving to and from a bed to a chair (including a wheelchair)?: Total Help needed standing up from a chair using your arms (e.g., wheelchair or bedside chair)?: A Lot Help needed to walk in hospital room?: Total Help needed climbing 3-5 steps with a railing? : Total 6 Click Score: 9    End of Session Equipment Utilized During Treatment: Gait belt Activity Tolerance: Patient limited by fatigue;Treatment limited secondary to medical complications (Comment);Other (comment) (limited by dizziness in seated/standing and tachycardia) Patient left: in bed;with call bell/phone within reach;with bed alarm set;with SCD's reapplied (RLE SCD not working) Nurse Communication: Mobility status;Other (comment) (needs TLSO brace, R SCD malfunction, unstable HR) PT Visit Diagnosis: Difficulty in walking, not elsewhere classified (R26.2);Muscle weakness (generalized) (M62.81);History of falling (Z91.81)     Time: 8182-9937 PT Time Calculation (min) (ACUTE ONLY): 39 min  Charges:  $Therapeutic  Exercise: 8-22 mins $Therapeutic Activity: 23-37 mins                     Youssef Footman P., PTA Acute Rehabilitation Services Pager: [phone] Office: [place] 04/24/2021, 12:10 PM

## 2021-04-24 NOTE — Progress Notes (Signed)
Orthopedic Tech Progress Note Patient Details:  Debbie Barry 06/16/1951 [id number]  Ortho Devices Type of Ortho Device: Lumbar corsett Ortho Device/Splint Location: BACK Ortho Device/Splint Interventions: Ordered, Adjustment   Post Interventions Patient Tolerated: Well Instructions Provided: Care of device  [name] 04/24/2021, 1:44 PM

## 2021-04-24 NOTE — Progress Notes (Signed)
[facility] PROGRESS NOTE    Progress Note  Alexy Bringle  ZOX:[id number] DOB: [birth year] DOA: 04/19/2021 PCP: Glendale Chard, MD     Brief Narrative:   Debbie Barry is an 70 y.o. female past medical history significant for fatty liver, GERD, essential hypertension, osteomyelitis of the right toe, peripheral vascular disease, history of upper GI bleed due to peptic ulcers who was recently discharged from the hospital for gastroenteritis, iron overload and maculopapular rash she is coming in today complaining of progressive weakness both lower extremities, falls and numbness.,  She had a Foley catheter placed for urinary retention couple of weeks ago she has not had a bowel movement in the past week, and MRI of the brain was done showed no acute CVA mild chronic microvascular disease MRI of the lumbar spine nonhealed L3-L4 compression fracture, severe canal stenosis at L4-L5 with severe right and moderate foraminal stenosis, MRI of the thoracic spine show multiple mild foraminal stenosis degenerative changes and at least moderate foraminal stenosis C6-C7 and C7-T1, with a moderate left greater than right pleural effusion.  Neurosurgery was curb sided that they relate that her degenerative stenosis would not be causing any of her acute neurological symptoms.  Additionally as she has a healing compression fracture of the lumbar spine these will need to be fully healed prior to surgery.  In the meantime they recommended an LCL brace and dexamethasone taper for radicular pain.  Assessment/Plan:   Principal Problem: Frequent falls with  Weakness of left lower extremities: Physical therapy and Occupational Therapy evaluated the patient recommended skilled nursing facility. Neurology was consulted on admission who reviewed did MRI that showed chronic degenerative changes which may require surgical intervention. Neurosurgery was consulted and states that her degenerative changes may  require surgical intervention in the future but is not causing her acute neurological dysfunction and her compression fracture has to be healed prior to surgery.  He did recommend dexamethasone taper. Patient is awaiting skilled nursing facility placement.  Hypokalemia: Orally now 3.5 we will continue to replete orally recheck in the morning.  Prolonged QTC/history of multifocal atrial tachycardia: Patient relates palpitation twice try to keep potassium greater than 4 magnesium greater than 2.  History of gastritis without esophagitis: Continue PPI and antiemetics.  Skin rash: Continue hydroxyzine.  Essential hypertension: Currently on no antihypertensive medication, her blood pressure is relatively well controlled follow-up with PCP as an outpatient. Keep the patient off antihypertensive medication.  Depression: Continue sertraline.  Severe protein caloric malnutrition: Continue Ensure 3 times daily.  Asymptomatic bacteriuria: He has no urinary symptoms like flank pain, fever or leukocytosis. Has remained afebrile in the leukocytosis follow-up with urology as an outpatient.    DVT prophylaxis: lovenox Family Communication:noen Status is: Observation  The patient remains OBS appropriate and will d/c before 2 midnights.     Code Status:     Code Status Orders  (From admission, onward)           Start     Ordered   04/20/21 0956  Full code  Continuous        04/20/21 0958           Code Status History     Date Active Date Inactive Code Status Order ID Comments User Context   03/31/2021 2100 04/05/2021 0008 Full Code [id number]  Etta Quill, DO ED   08/12/2020 2236 08/15/2020 1635 Full Code [id number]  Dessa Phi, DO ED         IV  Access:   Peripheral IV   Procedures and diagnostic studies:   MR CERVICAL SPINE WO CONTRAST  Result Date: 04/22/2021 CLINICAL DATA:  Motor neuron disease. Frequent falls with weakness of left lower  extremities. 70 y.o. female past medical history significant for fatty liver, GERD, essential hypertension, osteomyelitis of the right toe, peripheral vascular disease, history of upper GI bleed due to peptic ulcers who was recently discharged from the hospital for gastroenteritis, iron overload and maculopapular rash she is coming in today complaining of progressive weakness both lower extremities, falls and numbness. EXAM: MRI CERVICAL SPINE WITHOUT CONTRAST TECHNIQUE: Multiplanar, multisequence MR imaging of the cervical spine was performed. No intravenous contrast was administered. COMPARISON:  Limited correlation made with cranial and thoracic MRI 01/18/2021. FINDINGS: Alignment: Near anatomic. There is a slight anterolisthesis at C4-5 and C7-T1. Vertebrae: No acute or suspicious osseous findings. Cord: Normal in signal and caliber. Posterior Fossa, vertebral arteries, paraspinal tissues: Visualized portions of the posterior fossa appear unremarkable. No significant paraspinal findings. Bilateral vertebral artery flow voids. Disc levels: C2-3: Mild disc bulging with moderate bilateral facet hypertrophy. The central spinal canal is widely patent. Moderate right and mild left foraminal narrowing. C3-4: Mild disc bulging with bilateral uncinate spurring. Moderate to severe bilateral facet hypertrophy, worse on the right. The CSF surrounding the cord is partially effaced without cord deformity. Moderate to severe right and mild left foraminal narrowing. C4-5: There is loss of disc height with annular disc bulging and uncinate spurring. Moderate to severe facet hypertrophy bilaterally. The CSF surrounding the cord is effaced without cord deformity. Moderate to severe foraminal narrowing bilaterally which could affect either C5 nerve root. C5-6: Spondylosis with loss of disc height and posterior osteophytes asymmetric the right. Milder facet degenerative changes at this level. No cord deformity. Moderate to severe  right and mild left foraminal narrowing. C6-7: Spondylosis with loss of disc height and posterior osteophytes asymmetric to the right. The CSF surrounding the cord is partially effaced with mild right-sided cord flattening. Moderate to severe right and moderate left foraminal narrowing. C7-T1: Mild loss of disc height with disc bulging, uncinate spurring and bilateral facet hypertrophy. The CSF surrounding the cord is partially effaced without cord deformity. Moderate to severe foraminal narrowing bilaterally. The visualized upper thoracic spine appears unchanged from the recent thoracic MRI. IMPRESSION: 1. Multilevel cervical spondylosis with disc bulging, uncinate spurring and facet hypertrophy as described. 2. There is mild central spinal stenosis at multiple levels as detailed above. There is mild cord flattening on the right at C6-7. No abnormal cord signal. 3. Multilevel significant osseous foraminal narrowing which could affect the exiting nerve roots. 4. No acute findings demonstrated. Electronically Signed   By: Richardean Sale M.D.   On: 04/22/2021 10:52     Medical Consultants:   None.   Subjective:    Zarin Hagmann has no new complaints  Objective:    Vitals:   04/23/21 2300 04/23/21 2350 04/24/21 0429 04/24/21 0758  BP:  (!) 115/99 117/79 112/80  Pulse:  (!) 106 (!) 101 93  Resp: 12 16 16 14   Temp:  98.4 F (36.9 C) 97.6 F (36.4 C) 97.6 F (36.4 C)  TempSrc:    Oral  SpO2:  97% 98% 100%  Weight:      Height:       SpO2: 100 %   Intake/Output Summary (Last 24 hours) at 04/24/2021 0950 Last data filed at 04/24/2021 0900 Gross per 24 hour  Intake 120 ml  Output --  Net 120 ml    Filed Weights   04/19/21 2130  Weight: 53.1 kg    Exam: General exam: In no acute distress. Respiratory system: Good air movement and clear to auscultation. Cardiovascular system: S1 & S2 heard, RRR. No JVD. Gastrointestinal system: Abdomen is nondistended, soft and nontender.   Extremities: No pedal edema. Skin: No rashes, lesions or ulcers  Data Reviewed:    Labs: Basic Metabolic Panel: Recent Labs  Lab 04/20/21 0111 04/20/21 0120 04/22/21 0349  NA 138 139 136  K 3.1* 3.5 3.7  CL 101 103 103  CO2  --  25 27  GLUCOSE 76 79 78  BUN 9 10 9   CREATININE 0.50 0.71 0.70  CALCIUM  --  7.7* 7.5*  MG  --   --  1.5*    GFR Estimated Creatinine Clearance: 51.5 mL/min (by C-G formula based on SCr of 0.7 mg/dL). Liver Function Tests: Recent Labs  Lab 04/20/21 0120  AST 44*  ALT 26  ALKPHOS 167*  BILITOT 1.3*  PROT 5.3*  ALBUMIN 1.8*    No results for input(s): LIPASE, AMYLASE in the last 168 hours. No results for input(s): AMMONIA in the last 168 hours. Coagulation profile Recent Labs  Lab 04/20/21 0120  INR 1.0    COVID-19 Labs  No results for input(s): DDIMER, FERRITIN, LDH, CRP in the last 72 hours.  Lab Results  Component Value Date   SARSCOV2NAA NEGATIVE 04/19/2021   SARSCOV2NAA NEGATIVE 03/31/2021   [place] NEGATIVE 08/12/2020   [place] Not Detected 03/14/2020    CBC: Recent Labs  Lab 04/20/21 0100 04/20/21 0111  WBC 7.2  --   NEUTROABS 5.4  --   HGB 13.6 15.3*  HCT 38.2 45.0  MCV 95.3  --   PLT 260  --     Cardiac Enzymes: No results for input(s): CKTOTAL, CKMB, CKMBINDEX, TROPONINI in the last 168 hours. BNP (last 3 results) No results for input(s): PROBNP in the last 8760 hours. CBG: No results for input(s): GLUCAP in the last 168 hours. D-Dimer: No results for input(s): DDIMER in the last 72 hours. Hgb A1c: No results for input(s): HGBA1C in the last 72 hours. Lipid Profile: No results for input(s): CHOL, HDL, LDLCALC, TRIG, CHOLHDL, LDLDIRECT in the last 72 hours. Thyroid function studies: No results for input(s): TSH, T4TOTAL, T3FREE, THYROIDAB in the last 72 hours.  Invalid input(s): FREET3 Anemia work up: No results for input(s): VITAMINB12, FOLATE, FERRITIN, TIBC, IRON, RETICCTPCT in the  last 72 hours. Sepsis Labs: Recent Labs  Lab 04/20/21 0100  WBC 7.2    Microbiology Recent Results (from the past 240 hour(s))  Resp Panel by RT-PCR (Flu A&B, Covid) Nasopharyngeal Swab     Status: None   Collection Time: 04/19/21 10:51 PM   Specimen: Nasopharyngeal Swab; Nasopharyngeal(NP) swabs in vial transport medium  Result Value Ref Range Status   SARS Coronavirus 2 by RT PCR NEGATIVE NEGATIVE Final    Comment: (NOTE) SARS-CoV-2 target nucleic acids are NOT DETECTED.  The SARS-CoV-2 RNA is generally detectable in upper respiratory specimens during the acute phase of infection. The lowest concentration of SARS-CoV-2 viral copies this assay can detect is 138 copies/mL. A negative result does not preclude SARS-Cov-2 infection and should not be used as the sole basis for treatment or other patient management decisions. A negative result may occur with  improper specimen collection/handling, submission of specimen other than nasopharyngeal swab, presence of viral mutation(s) within the areas targeted by this assay, and inadequate  number of viral copies(<138 copies/mL). A negative result must be combined with clinical observations, patient history, and epidemiological information. The expected result is Negative.  Fact Sheet for Patients:  EntrepreneurPulse.com.au  Fact Sheet for Healthcare Providers:  IncredibleEmployment.be  This test is no t yet approved or cleared by the [place] FDA and  has been authorized for detection and/or diagnosis of SARS-CoV-2 by FDA under an Emergency Use Authorization (EUA). This EUA will remain  in effect (meaning this test can be used) for the duration of the COVID-19 declaration under Section 564(b)(1) of the Act, 21 U.S.C.section 360bbb-3(b)(1), unless the authorization is terminated  or revoked sooner.       Influenza A by PCR NEGATIVE NEGATIVE Final   Influenza B by PCR NEGATIVE NEGATIVE  Final    Comment: (NOTE) The Xpert Xpress SARS-CoV-2/FLU/RSV plus assay is intended as an aid in the diagnosis of influenza from Nasopharyngeal swab specimens and should not be used as a sole basis for treatment. Nasal washings and aspirates are unacceptable for Xpert Xpress SARS-CoV-2/FLU/RSV testing.  Fact Sheet for Patients: EntrepreneurPulse.com.au  Fact Sheet for Healthcare Providers: IncredibleEmployment.be  This test is not yet approved or cleared by the [place] FDA and has been authorized for detection and/or diagnosis of SARS-CoV-2 by FDA under an Emergency Use Authorization (EUA). This EUA will remain in effect (meaning this test can be used) for the duration of the COVID-19 declaration under Section 564(b)(1) of the Act, 21 U.S.C. section 360bbb-3(b)(1), unless the authorization is terminated or revoked.  Performed at [facility], [place] [address], [place], [place] [address]   Culture, blood (routine x 2)     Status: None (Preliminary result)   Collection Time: 04/23/21  9:16 PM   Specimen: BLOOD  Result Value Ref Range Status   Specimen Description BLOOD RIGHT HAND  Final   Special Requests   Final    BOTTLES DRAWN AEROBIC ONLY Blood Culture results may not be optimal due to an inadequate volume of blood received in culture bottles   Culture   Final    NO GROWTH < 12 HOURS Performed at [facility], [place] [address] [place], [place] [address]    Report Status PENDING  Incomplete  Culture, blood (routine x 2)     Status: None (Preliminary result)   Collection Time: 04/23/21  9:25 PM   Specimen: BLOOD  Result Value Ref Range Status   Specimen Description BLOOD RIGHT ARM  Final   Special Requests   Final    BOTTLES DRAWN AEROBIC AND ANAEROBIC Blood Culture adequate volume   Culture   Final    NO GROWTH < 12 HOURS Performed at [facility], [place] [address] [place], [place] [address]    Report  Status PENDING  Incomplete     Medications:    Chlorhexidine Gluconate Cloth  6 each Topical Daily   dexamethasone  4 mg Oral Q12H   feeding supplement  237 mL Oral TID BM   hydrOXYzine  50 mg Oral TID   pantoprazole  40 mg Oral Daily   sertraline  25 mg Oral Daily   sodium chloride flush  10-40 mL Intracatheter Q12H   Continuous Infusions:    LOS: 0 days   Charlynne Cousins  [facility]  04/24/2021, 9:50 AM

## 2021-04-24 NOTE — Progress Notes (Signed)
Name: Debbie Barry DOB: [birth year]  Please be advised that the above-named patient will require a short-term nursing home stay -- anticipated 30 days or less for rehabilitation and strengthening. The plan is for return home.

## 2021-04-24 NOTE — Progress Notes (Signed)
Notified by tech that foley came out during foley care.  Balloon was not inflated.  MD notified.

## 2021-04-25 DIAGNOSIS — I429 Cardiomyopathy, unspecified: Secondary | ICD-10-CM | POA: Diagnosis not present

## 2021-04-25 DIAGNOSIS — I824Y2 Acute embolism and thrombosis of unspecified deep veins of left proximal lower extremity: Secondary | ICD-10-CM | POA: Diagnosis not present

## 2021-04-25 DIAGNOSIS — R0600 Dyspnea, unspecified: Secondary | ICD-10-CM | POA: Diagnosis not present

## 2021-04-25 DIAGNOSIS — R296 Repeated falls: Secondary | ICD-10-CM | POA: Diagnosis not present

## 2021-04-25 DIAGNOSIS — R0602 Shortness of breath: Secondary | ICD-10-CM | POA: Diagnosis not present

## 2021-04-25 DIAGNOSIS — R609 Edema, unspecified: Secondary | ICD-10-CM | POA: Diagnosis not present

## 2021-04-25 DIAGNOSIS — I420 Dilated cardiomyopathy: Secondary | ICD-10-CM | POA: Diagnosis not present

## 2021-04-25 DIAGNOSIS — I472 Ventricular tachycardia, unspecified: Secondary | ICD-10-CM | POA: Diagnosis not present

## 2021-04-25 DIAGNOSIS — E43 Unspecified severe protein-calorie malnutrition: Secondary | ICD-10-CM

## 2021-04-25 DIAGNOSIS — R531 Weakness: Secondary | ICD-10-CM | POA: Diagnosis not present

## 2021-04-25 DIAGNOSIS — R000 Tachycardia, unspecified: Secondary | ICD-10-CM | POA: Diagnosis not present

## 2021-04-25 DIAGNOSIS — M5090 Cervical disc disorder, unspecified, unspecified cervical region: Secondary | ICD-10-CM | POA: Diagnosis not present

## 2021-04-25 DIAGNOSIS — Z7401 Bed confinement status: Secondary | ICD-10-CM | POA: Diagnosis not present

## 2021-04-25 DIAGNOSIS — I110 Hypertensive heart disease with heart failure: Secondary | ICD-10-CM | POA: Diagnosis present

## 2021-04-25 DIAGNOSIS — J9601 Acute respiratory failure with hypoxia: Secondary | ICD-10-CM | POA: Diagnosis not present

## 2021-04-25 DIAGNOSIS — I4729 Other ventricular tachycardia: Secondary | ICD-10-CM | POA: Diagnosis not present

## 2021-04-25 DIAGNOSIS — I517 Cardiomegaly: Secondary | ICD-10-CM | POA: Diagnosis not present

## 2021-04-25 DIAGNOSIS — R5381 Other malaise: Secondary | ICD-10-CM | POA: Diagnosis present

## 2021-04-25 DIAGNOSIS — R112 Nausea with vomiting, unspecified: Secondary | ICD-10-CM | POA: Diagnosis not present

## 2021-04-25 DIAGNOSIS — R1311 Dysphagia, oral phase: Secondary | ICD-10-CM | POA: Diagnosis not present

## 2021-04-25 DIAGNOSIS — N280 Ischemia and infarction of kidney: Secondary | ICD-10-CM | POA: Diagnosis present

## 2021-04-25 DIAGNOSIS — I5082 Biventricular heart failure: Secondary | ICD-10-CM | POA: Diagnosis present

## 2021-04-25 DIAGNOSIS — Z800 Family history of malignant neoplasm of digestive organs: Secondary | ICD-10-CM | POA: Diagnosis not present

## 2021-04-25 DIAGNOSIS — E8809 Other disorders of plasma-protein metabolism, not elsewhere classified: Secondary | ICD-10-CM | POA: Diagnosis not present

## 2021-04-25 DIAGNOSIS — R0789 Other chest pain: Secondary | ICD-10-CM | POA: Diagnosis not present

## 2021-04-25 DIAGNOSIS — R339 Retention of urine, unspecified: Secondary | ICD-10-CM | POA: Diagnosis not present

## 2021-04-25 DIAGNOSIS — I469 Cardiac arrest, cause unspecified: Secondary | ICD-10-CM | POA: Diagnosis not present

## 2021-04-25 DIAGNOSIS — R0902 Hypoxemia: Secondary | ICD-10-CM | POA: Diagnosis not present

## 2021-04-25 DIAGNOSIS — I4721 Torsades de pointes: Secondary | ICD-10-CM | POA: Diagnosis not present

## 2021-04-25 DIAGNOSIS — E871 Hypo-osmolality and hyponatremia: Secondary | ICD-10-CM | POA: Diagnosis present

## 2021-04-25 DIAGNOSIS — Z807 Family history of other malignant neoplasms of lymphoid, hematopoietic and related tissues: Secondary | ICD-10-CM | POA: Diagnosis not present

## 2021-04-25 DIAGNOSIS — I82411 Acute embolism and thrombosis of right femoral vein: Secondary | ICD-10-CM | POA: Diagnosis not present

## 2021-04-25 DIAGNOSIS — F339 Major depressive disorder, recurrent, unspecified: Secondary | ICD-10-CM | POA: Diagnosis not present

## 2021-04-25 DIAGNOSIS — R1084 Generalized abdominal pain: Secondary | ICD-10-CM | POA: Diagnosis not present

## 2021-04-25 DIAGNOSIS — E876 Hypokalemia: Secondary | ICD-10-CM | POA: Diagnosis not present

## 2021-04-25 DIAGNOSIS — J90 Pleural effusion, not elsewhere classified: Secondary | ICD-10-CM | POA: Diagnosis not present

## 2021-04-25 DIAGNOSIS — L299 Pruritus, unspecified: Secondary | ICD-10-CM | POA: Diagnosis not present

## 2021-04-25 DIAGNOSIS — I2510 Atherosclerotic heart disease of native coronary artery without angina pectoris: Secondary | ICD-10-CM | POA: Diagnosis not present

## 2021-04-25 DIAGNOSIS — J918 Pleural effusion in other conditions classified elsewhere: Secondary | ICD-10-CM | POA: Diagnosis present

## 2021-04-25 DIAGNOSIS — I462 Cardiac arrest due to underlying cardiac condition: Secondary | ICD-10-CM | POA: Diagnosis not present

## 2021-04-25 DIAGNOSIS — M4304 Spondylolysis, thoracic region: Secondary | ICD-10-CM | POA: Diagnosis not present

## 2021-04-25 DIAGNOSIS — W19XXXA Unspecified fall, initial encounter: Secondary | ICD-10-CM | POA: Diagnosis not present

## 2021-04-25 DIAGNOSIS — Z79899 Other long term (current) drug therapy: Secondary | ICD-10-CM | POA: Diagnosis not present

## 2021-04-25 DIAGNOSIS — R8271 Bacteriuria: Secondary | ICD-10-CM | POA: Diagnosis not present

## 2021-04-25 DIAGNOSIS — I10 Essential (primary) hypertension: Secondary | ICD-10-CM | POA: Diagnosis not present

## 2021-04-25 DIAGNOSIS — R9431 Abnormal electrocardiogram [ECG] [EKG]: Secondary | ICD-10-CM | POA: Diagnosis not present

## 2021-04-25 DIAGNOSIS — Z8719 Personal history of other diseases of the digestive system: Secondary | ICD-10-CM | POA: Diagnosis not present

## 2021-04-25 DIAGNOSIS — K760 Fatty (change of) liver, not elsewhere classified: Secondary | ICD-10-CM | POA: Diagnosis present

## 2021-04-25 DIAGNOSIS — M48061 Spinal stenosis, lumbar region without neurogenic claudication: Secondary | ICD-10-CM | POA: Diagnosis not present

## 2021-04-25 DIAGNOSIS — M4306 Spondylolysis, lumbar region: Secondary | ICD-10-CM | POA: Diagnosis not present

## 2021-04-25 DIAGNOSIS — I4901 Ventricular fibrillation: Secondary | ICD-10-CM | POA: Diagnosis not present

## 2021-04-25 DIAGNOSIS — I4581 Long QT syndrome: Secondary | ICD-10-CM | POA: Diagnosis not present

## 2021-04-25 DIAGNOSIS — M545 Low back pain, unspecified: Secondary | ICD-10-CM | POA: Diagnosis not present

## 2021-04-25 DIAGNOSIS — I739 Peripheral vascular disease, unspecified: Secondary | ICD-10-CM | POA: Diagnosis not present

## 2021-04-25 DIAGNOSIS — R262 Difficulty in walking, not elsewhere classified: Secondary | ICD-10-CM | POA: Diagnosis not present

## 2021-04-25 DIAGNOSIS — J9811 Atelectasis: Secondary | ICD-10-CM | POA: Diagnosis not present

## 2021-04-25 DIAGNOSIS — I82422 Acute embolism and thrombosis of left iliac vein: Secondary | ICD-10-CM | POA: Diagnosis not present

## 2021-04-25 DIAGNOSIS — M6281 Muscle weakness (generalized): Secondary | ICD-10-CM | POA: Diagnosis not present

## 2021-04-25 DIAGNOSIS — Z20822 Contact with and (suspected) exposure to covid-19: Secondary | ICD-10-CM | POA: Diagnosis not present

## 2021-04-25 DIAGNOSIS — I428 Other cardiomyopathies: Secondary | ICD-10-CM | POA: Diagnosis not present

## 2021-04-25 DIAGNOSIS — R29898 Other symptoms and signs involving the musculoskeletal system: Secondary | ICD-10-CM | POA: Diagnosis not present

## 2021-04-25 DIAGNOSIS — R601 Generalized edema: Secondary | ICD-10-CM | POA: Diagnosis not present

## 2021-04-25 DIAGNOSIS — I5021 Acute systolic (congestive) heart failure: Secondary | ICD-10-CM | POA: Diagnosis not present

## 2021-04-25 DIAGNOSIS — R079 Chest pain, unspecified: Secondary | ICD-10-CM | POA: Diagnosis not present

## 2021-04-25 DIAGNOSIS — R600 Localized edema: Secondary | ICD-10-CM | POA: Diagnosis not present

## 2021-04-25 DIAGNOSIS — G8929 Other chronic pain: Secondary | ICD-10-CM | POA: Diagnosis not present

## 2021-04-25 DIAGNOSIS — Z803 Family history of malignant neoplasm of breast: Secondary | ICD-10-CM | POA: Diagnosis not present

## 2021-04-25 DIAGNOSIS — I5043 Acute on chronic combined systolic (congestive) and diastolic (congestive) heart failure: Secondary | ICD-10-CM | POA: Diagnosis not present

## 2021-04-25 DIAGNOSIS — F32A Depression, unspecified: Secondary | ICD-10-CM | POA: Diagnosis present

## 2021-04-25 DIAGNOSIS — R2681 Unsteadiness on feet: Secondary | ICD-10-CM | POA: Diagnosis not present

## 2021-04-25 MED ORDER — SODIUM CHLORIDE 0.9 % IV SOLN: INTRAVENOUS | Status: DC

## 2021-04-25 MED ORDER — DEXAMETHASONE 2 MG PO TABS
1.0000 mg | ORAL_TABLET | Freq: Two times a day (BID) | ORAL | Status: DC
  Administered 2021-04-25: 1 mg via ORAL

## 2021-04-25 MED ORDER — DEXAMETHASONE 1 MG PO TABS: 1.0000 mg | ORAL_TABLET | Freq: Two times a day (BID) | ORAL | 0 refills | Status: AC

## 2021-04-25 NOTE — TOC Transition Note (Signed)
Transition of Care [facility]) - CM/SW Discharge Note   Patient Details  Name: Decklyn Hornik MRN: [id number] Date of Birth: 10-23-1950  [facility]) CM/SW Contact:  Geralynn Ochs, LCSW Phone Number: 04/25/2021, 11:27 AM   Clinical Narrative:   Nurse to call report to [phone].  Transport scheduled for 12:45.    Final next level of care: Skilled Nursing Facility Barriers to Discharge: Barriers Resolved   Patient Goals and CMS Choice Patient states their goals for this hospitalization and ongoing recovery are:: To be able to return home CMS Medicare.gov Compare Post Acute Care list provided to:: Patient Choice offered to / list presented to : Patient  Discharge Placement              Patient chooses bed at: [facility] Patient to be transferred to facility by: [place] Name of family member notified: Izell [place] Patient and family notified of of transfer: 04/25/21  Discharge Plan and Services     Post Acute Care Choice: [place]                               Social Determinants of Health (SDOH) Interventions     Readmission Risk Interventions No flowsheet data found.

## 2021-04-25 NOTE — TOC Progression Note (Signed)
Transition of Care [facility]) - Progression Note    Patient Details  Name: Debbie Barry MRN: [id number] Date of Birth: 1951-01-19  Transition of Care [facility]) CM/SW [place], [place] Phone Number: 04/25/2021, 10:13 AM  Clinical Narrative:   CSW following for discharge to SNF. Patients' insurance has been approved but PASRR is still under manual review. CSW placed 30 day note in the chart, awaiting signature from MD to upload to [place] to follow.    Expected Discharge Plan: Skilled Nursing Facility Barriers to Discharge: [place] [facility])  Expected Discharge Plan and Services Expected Discharge Plan: [place] Choice: [place] arrangements for the past 2 months: Single Family Home Expected Discharge Date: 04/23/21                                     Social Determinants of Health (SDOH) Interventions    Readmission Risk Interventions No flowsheet data found.

## 2021-04-25 NOTE — Discharge Summary (Signed)
Physician Discharge Summary  Jennifier Smitherman BZJ:[id number] DOB: [birth year] DOA: 04/19/2021  PCP: Glendale Chard, MD  Admit date: 04/19/2021 Discharge date: 04/25/2021  Admitted From: Home Disposition:  SNF  Recommendations for Outpatient Follow-up:  Follow up with Neurology in 1-2 weeks as an outpatient  for an EMG Please obtain BMP/CBC in one week  Home Health:No Equipment/Devices:None  Discharge Condition:Stable CODE STATUS:Full Diet recommendation: Heart Healthy   Brief/Interim Summary: 70 y.o. female past medical history significant for fatty liver, GERD, essential hypertension, osteomyelitis of the right toe, peripheral vascular disease, history of upper GI bleed due to peptic ulcers who was recently discharged from the hospital for gastroenteritis, iron overload and maculopapular rash she is coming in today complaining of progressive weakness both lower extremities, falls and numbness.,  She had a Foley catheter placed for urinary retention couple of weeks ago she has not had a bowel movement in the past week, and MRI of the brain was done showed no acute CVA mild chronic microvascular disease MRI of the lumbar spine nonhealed L3-L4 compression fracture, severe canal stenosis at L4-L5 with severe right and moderate foraminal stenosis, MRI of the thoracic spine show multiple mild foraminal stenosis degenerative changes and at least moderate foraminal stenosis C6-C7 and C7-T1, with a moderate left greater than right pleural effusion.  Neurosurgery was curb sided that they relate that her degenerative stenosis would not be causing any of her acute neurological symptoms.  Additionally as she has a healing compression fracture of the lumbar spine these will need to be fully healed prior to surgery.  In the meantime they recommended an LCL brace and dexamethasone taper for radicular pain  Discharge Diagnoses:  Principal Problem:   Weakness of left lower extremity Active Problems:    Essential hypertension, benign   Depression   Hypokalemia   Falls frequently   Gastritis   Protein-calorie malnutrition, severe   Asymptomatic bacteriuria   Prolonged QT interval  Frequent fall with weakness of left lower extremities: MRI of the C-spine ,thoracic spine and lumbar spine showed multiple level spondylosis with disc bulging at the cervical spine with mild spinal stenosis at the level C6-C7, MRI of the lumbar spine shows severe spinal stenosis with compression of L4-L5.   Neurology was consulted recommended to consult neurosurgery.   Neurosurgery was consulted on admission 04/20/2021 they reviewed the MRI that showed chronic degenerative stenosis which may require surgical attention in the future but not causing any acute changes or neurological dysfunction, additionally she has a healing fracture in the lumbar spine these will need to be fully healed prior to surgery.  In the meantime they also recommended a dexamethasone taper and LCO brace.  Hypokalemia: Repleted orally now resolved.  Prolonged QTC/multifocal atrial tachycardia: His potassium Greater than 4 magnesium greater than 2 she had no events on telemetry.  History of gastritis: Continue PPI and antiemetics.  Central hypertension: She is currently on no antihypertensive medication follow-up with PCP as an outpatient.  Depression: Continue sertraline.  Severe protein caloric malnutrition: Continue Ensure 3 times daily.  Urinary retention a Foley was placed prior to admission she will go home with Foley was exchanged in the hospital.  Discharge Instructions  Discharge Instructions     Diet - low sodium heart healthy   Complete by: As directed    Diet - low sodium heart healthy   Complete by: As directed    Increase activity slowly   Complete by: As directed    Increase activity slowly  Complete by: As directed       Allergies as of 04/25/2021   No Known Allergies      Medication List      STOP taking these medications    DIUREX PO   triamcinolone ointment 0.5 % Commonly known as: KENALOG       TAKE these medications    dexamethasone 1 MG tablet Commonly known as: DECADRON Take 1 tablet (1 mg total) by mouth every 12 (twelve) hours for 2 days.   feeding supplement Liqd Take 237 mLs by mouth 3 (three) times daily between meals.   hydrOXYzine 50 MG tablet Commonly known as: ATARAX/VISTARIL Take 1 tablet (50 mg total) by mouth 3 (three) times daily.   multivitamin with minerals Tabs tablet Take 1 tablet by mouth daily.   ondansetron 4 MG disintegrating tablet Commonly known as: Zofran ODT Take 1 tablet (4 mg total) by mouth every 8 (eight) hours as needed for nausea or vomiting.   pantoprazole 40 MG tablet Commonly known as: PROTONIX Take 1 tablet (40 mg total) by mouth daily.   potassium chloride SA 20 MEQ tablet Commonly known as: KLOR-CON Take 1 tablet (20 mEq total) by mouth 2 (two) times daily. What changed: when to take this   sertraline 25 MG tablet Commonly known as: ZOLOFT Take 1 tablet (25 mg total) by mouth daily.   VISINE OP Place 2 drops into both eyes daily as needed (for dry/irritated eyes).   Vitamin D3 125 MCG (5000 UT) Caps Take 5,000 Units by mouth daily.        Contact information for follow-up providers     [facility] Follow up in 2 week(s).   Why: Follow up with Dr. Narda Amber for EMG/NCS Contact information: [place], [place] [phone]             Contact information for after-discharge care     Destination     [facility] .   Service: Skilled Nursing Contact information: [address] [place] [phone]                    No Known Allergies  Consultations: Neurosurgery Neurology   Procedures/Studies: CT HEAD WO CONTRAST (5MM)  Result Date:  04/19/2021 CLINICAL DATA:  Neuro deficit, acute, stroke suspected EXAM: CT HEAD WITHOUT CONTRAST TECHNIQUE: Contiguous axial images were obtained from the base of the skull through the vertex without intravenous contrast. COMPARISON:  02/22/2021 FINDINGS: Brain: There is atrophy and chronic small vessel disease changes. No acute intracranial abnormality. Specifically, no hemorrhage, hydrocephalus, mass lesion, acute infarction, or significant intracranial injury. Vascular: No hyperdense vessel or unexpected calcification. Skull: No acute calvarial abnormality. Sinuses/Orbits: No acute findings Other: None IMPRESSION: Atrophy, chronic microvascular disease. No acute intracranial abnormality. Electronically Signed   By: Rolm Baptise M.D.   On: 04/19/2021 22:52   MR BRAIN WO CONTRAST  Result Date: 04/20/2021 CLINICAL DATA:  Neuro deficit, acute, stroke suspected EXAM: MRI HEAD WITHOUT CONTRAST TECHNIQUE: Multiplanar, multiecho pulse sequences of the brain and surrounding structures were obtained without intravenous contrast. COMPARISON:  CT head10/20/2022. FINDINGS: Brain: No acute infarction, hemorrhage, hydrocephalus, extra-axial collection or mass lesion. Mild scattered T2 hyperintensities in the white matter, nonspecific but compatible with chronic microvascular disease. Mild atrophy. Vascular: Major arterial flow voids are maintained at the skull base. Skull and upper cervical spine: Normal marrow signal. Sinuses/Orbits: Clear sinuses.  Unremarkable orbits Other:  Small right and trace left mastoid effusions. IMPRESSION: 1. No evidence of acute intracranial abnormality. 2. Mild chronic microvascular disease and atrophy. Electronically Signed   By: Margaretha Sheffield M.D.   On: 04/20/2021 06:55   MR CERVICAL SPINE WO CONTRAST  Result Date: 04/22/2021 CLINICAL DATA:  Motor neuron disease. Frequent falls with weakness of left lower extremities. 70 y.o. female past medical history significant for fatty liver,  GERD, essential hypertension, osteomyelitis of the right toe, peripheral vascular disease, history of upper GI bleed due to peptic ulcers who was recently discharged from the hospital for gastroenteritis, iron overload and maculopapular rash she is coming in today complaining of progressive weakness both lower extremities, falls and numbness. EXAM: MRI CERVICAL SPINE WITHOUT CONTRAST TECHNIQUE: Multiplanar, multisequence MR imaging of the cervical spine was performed. No intravenous contrast was administered. COMPARISON:  Limited correlation made with cranial and thoracic MRI 01/18/2021. FINDINGS: Alignment: Near anatomic. There is a slight anterolisthesis at C4-5 and C7-T1. Vertebrae: No acute or suspicious osseous findings. Cord: Normal in signal and caliber. Posterior Fossa, vertebral arteries, paraspinal tissues: Visualized portions of the posterior fossa appear unremarkable. No significant paraspinal findings. Bilateral vertebral artery flow voids. Disc levels: C2-3: Mild disc bulging with moderate bilateral facet hypertrophy. The central spinal canal is widely patent. Moderate right and mild left foraminal narrowing. C3-4: Mild disc bulging with bilateral uncinate spurring. Moderate to severe bilateral facet hypertrophy, worse on the right. The CSF surrounding the cord is partially effaced without cord deformity. Moderate to severe right and mild left foraminal narrowing. C4-5: There is loss of disc height with annular disc bulging and uncinate spurring. Moderate to severe facet hypertrophy bilaterally. The CSF surrounding the cord is effaced without cord deformity. Moderate to severe foraminal narrowing bilaterally which could affect either C5 nerve root. C5-6: Spondylosis with loss of disc height and posterior osteophytes asymmetric the right. Milder facet degenerative changes at this level. No cord deformity. Moderate to severe right and mild left foraminal narrowing. C6-7: Spondylosis with loss of disc  height and posterior osteophytes asymmetric to the right. The CSF surrounding the cord is partially effaced with mild right-sided cord flattening. Moderate to severe right and moderate left foraminal narrowing. C7-T1: Mild loss of disc height with disc bulging, uncinate spurring and bilateral facet hypertrophy. The CSF surrounding the cord is partially effaced without cord deformity. Moderate to severe foraminal narrowing bilaterally. The visualized upper thoracic spine appears unchanged from the recent thoracic MRI. IMPRESSION: 1. Multilevel cervical spondylosis with disc bulging, uncinate spurring and facet hypertrophy as described. 2. There is mild central spinal stenosis at multiple levels as detailed above. There is mild cord flattening on the right at C6-7. No abnormal cord signal. 3. Multilevel significant osseous foraminal narrowing which could affect the exiting nerve roots. 4. No acute findings demonstrated. Electronically Signed   By: Richardean Sale M.D.   On: 04/22/2021 10:52   MR THORACIC SPINE WO CONTRAST  Result Date: 04/20/2021 CLINICAL DATA:  Ataxia, nontraumatic, T-spine pathology suspected; Low back pain, cauda equina syndrome suspected EXAM: MRI THORACIC AND LUMBAR SPINE WITHOUT CONTRAST TECHNIQUE: Multiplanar and multiecho pulse sequences of the thoracic and lumbar spine were obtained without intravenous contrast. COMPARISON:  CT lumbar spine 02/22/2021. FINDINGS: MRI THORACIC SPINE FINDINGS Alignment:  Slight anterolisthesis of T1 on T2 and T2 on T3. Vertebrae: Vertebral body heights are maintained.Multilevel degenerative/discogenic endplate signal changes, greatest at T9-T10 where there is disc height loss. Otherwise, no focal marrow edema to suggest acute fracture or discitis/osteomyelitis.  Heterogeneous bone marrow without suspicious bone lesion. Cord:  Normal cord signal. Paraspinal and other soft tissues: Moderate left greater than right pleural effusions. Disc levels: Disc bulges  and ligamentum flavum thickening at multiple levels throughout the imaged lower cervical and thoracic spine. Although partially imaged on sagittal imaging, probably mild to moderate canal stenosis and potentially at least moderate foraminal stenosis at C6-C7 and C7-T1. Mild canal stenosis at T5-T6, T7-T8, and T10-T11. Multilevel facet arthropathy. Foraminal stenosis is greatest and moderate on the right at T10-T11. Otherwise, multilevel mild foraminal stenosis in the thoracic spine. MRI LUMBAR SPINE FINDINGS Segmentation:  Standard Alignment: Similar grade 1 anterolisthesis of L2 on L3, L3 on L4, and L4 on L5. Vertebrae: Redemonstrated L3 and L4 compression fractures with similar height loss (35% L3 and 50% at L4). Edema within the superior endplates at these levels suggests that the fractures are unhealed. Edema within the L4 posterior elements. No substantial retropulsion. Conus medullaris and cauda equina: Conus extends to the T12-L1 level. Conus appears normal. Paraspinal and other soft tissues: Paraspinal muscular atrophy. Disc levels: T12-L1: No significant disc protrusion, foraminal stenosis, or canal stenosis. L1-L2: No significant disc protrusion, foraminal stenosis, or canal stenosis. L2-L3: Grade 1 anterolisthesis. Left eccentric disc bulge, bulky ligamentum flavum thickening and mild facet arthropathy. Prominent dorsal epidural fat. Resulting moderate canal stenosis without significant foraminal stenosis. L3-L4: Grade 1 anterolisthesis. Left eccentric disc bulge with bulky ligamentum flavum thickening and moderate bilateral facet arthropathy. Prominent dorsal epidural fat. Resulting moderate to severe canal stenosis without significant foraminal stenosis. L4-L5: Grade 1 anterolisthesis. Uncovering the disc with uncovering the disc and superimposed broad disc bulge. Severe bilateral facet arthropathy with bulky ligamentum flavum thickening. Resulting severe canal stenosis. Severe right and moderate left  foraminal stenosis. L5-S1: Mild broad disc bulge and endplate spurring with mild-to-moderate bilateral facet arthropathy. Resulting mild bilateral foraminal stenosis and mild bilateral subarticular recess stenosis without significant canal stenosis. IMPRESSION: MR LUMBAR SPINE IMPRESSION 1. Unhealed L3 and L4 compression fractures with similar height loss and marrow edema along the superior endplates. Edema within the L4 posterior elements could be posttraumatic or degenerative in etiology. 2. At L4-L5, severe canal stenosis with severe right and moderate left foraminal stenosis. 3. Moderate to severe canal stenosis at L3-L4 and moderate canal stenosis at L2-L3. MR THORACIC SPINE IMPRESSION 1. Moderate foraminal stenosis on the right at T10-T11. Otherwise, multilevel mild foraminal stenosis in the thoracic spine. 2. Multilevel degenerative disc disease and mild canal stenosis in the thoracic spine. 3. Potentially mild-to-moderate canal stenosis and at least moderate foraminal stenosis C6-C7 and C7-T1, partially only imaged on sagittal and incompletely assessed. Dedicated MRI of the cervical spine could further characterize if clinically indicated. 4. Moderate left greater than right pleural effusions. Consider dedicated chest imaging. Electronically Signed   By: Margaretha Sheffield M.D.   On: 04/20/2021 07:46   MR LUMBAR SPINE WO CONTRAST  Result Date: 04/20/2021 CLINICAL DATA:  Ataxia, nontraumatic, T-spine pathology suspected; Low back pain, cauda equina syndrome suspected EXAM: MRI THORACIC AND LUMBAR SPINE WITHOUT CONTRAST TECHNIQUE: Multiplanar and multiecho pulse sequences of the thoracic and lumbar spine were obtained without intravenous contrast. COMPARISON:  CT lumbar spine 02/22/2021. FINDINGS: MRI THORACIC SPINE FINDINGS Alignment:  Slight anterolisthesis of T1 on T2 and T2 on T3. Vertebrae: Vertebral body heights are maintained.Multilevel degenerative/discogenic endplate signal changes, greatest at  T9-T10 where there is disc height loss. Otherwise, no focal marrow edema to suggest acute fracture or discitis/osteomyelitis. Heterogeneous bone marrow without suspicious bone  lesion. Cord:  Normal cord signal. Paraspinal and other soft tissues: Moderate left greater than right pleural effusions. Disc levels: Disc bulges and ligamentum flavum thickening at multiple levels throughout the imaged lower cervical and thoracic spine. Although partially imaged on sagittal imaging, probably mild to moderate canal stenosis and potentially at least moderate foraminal stenosis at C6-C7 and C7-T1. Mild canal stenosis at T5-T6, T7-T8, and T10-T11. Multilevel facet arthropathy. Foraminal stenosis is greatest and moderate on the right at T10-T11. Otherwise, multilevel mild foraminal stenosis in the thoracic spine. MRI LUMBAR SPINE FINDINGS Segmentation:  Standard Alignment: Similar grade 1 anterolisthesis of L2 on L3, L3 on L4, and L4 on L5. Vertebrae: Redemonstrated L3 and L4 compression fractures with similar height loss (35% L3 and 50% at L4). Edema within the superior endplates at these levels suggests that the fractures are unhealed. Edema within the L4 posterior elements. No substantial retropulsion. Conus medullaris and cauda equina: Conus extends to the T12-L1 level. Conus appears normal. Paraspinal and other soft tissues: Paraspinal muscular atrophy. Disc levels: T12-L1: No significant disc protrusion, foraminal stenosis, or canal stenosis. L1-L2: No significant disc protrusion, foraminal stenosis, or canal stenosis. L2-L3: Grade 1 anterolisthesis. Left eccentric disc bulge, bulky ligamentum flavum thickening and mild facet arthropathy. Prominent dorsal epidural fat. Resulting moderate canal stenosis without significant foraminal stenosis. L3-L4: Grade 1 anterolisthesis. Left eccentric disc bulge with bulky ligamentum flavum thickening and moderate bilateral facet arthropathy. Prominent dorsal epidural fat. Resulting  moderate to severe canal stenosis without significant foraminal stenosis. L4-L5: Grade 1 anterolisthesis. Uncovering the disc with uncovering the disc and superimposed broad disc bulge. Severe bilateral facet arthropathy with bulky ligamentum flavum thickening. Resulting severe canal stenosis. Severe right and moderate left foraminal stenosis. L5-S1: Mild broad disc bulge and endplate spurring with mild-to-moderate bilateral facet arthropathy. Resulting mild bilateral foraminal stenosis and mild bilateral subarticular recess stenosis without significant canal stenosis. IMPRESSION: MR LUMBAR SPINE IMPRESSION 1. Unhealed L3 and L4 compression fractures with similar height loss and marrow edema along the superior endplates. Edema within the L4 posterior elements could be posttraumatic or degenerative in etiology. 2. At L4-L5, severe canal stenosis with severe right and moderate left foraminal stenosis. 3. Moderate to severe canal stenosis at L3-L4 and moderate canal stenosis at L2-L3. MR THORACIC SPINE IMPRESSION 1. Moderate foraminal stenosis on the right at T10-T11. Otherwise, multilevel mild foraminal stenosis in the thoracic spine. 2. Multilevel degenerative disc disease and mild canal stenosis in the thoracic spine. 3. Potentially mild-to-moderate canal stenosis and at least moderate foraminal stenosis C6-C7 and C7-T1, partially only imaged on sagittal and incompletely assessed. Dedicated MRI of the cervical spine could further characterize if clinically indicated. 4. Moderate left greater than right pleural effusions. Consider dedicated chest imaging. Electronically Signed   By: Margaretha Sheffield M.D.   On: 04/20/2021 07:46   CT ABDOMEN PELVIS W CONTRAST  Result Date: 03/31/2021 CLINICAL DATA:  Acute abdominal pain. EXAM: CT ABDOMEN AND PELVIS WITH CONTRAST TECHNIQUE: Multidetector CT imaging of the abdomen and pelvis was performed using the standard protocol following bolus administration of intravenous  contrast. CONTRAST:  53mL OMNIPAQUE IOHEXOL 350 MG/ML SOLN COMPARISON:  Abdominal ultrasound dated 10/27/2015. FINDINGS: Lower chest: There is mild bibasilar atelectasis. The heart is enlarged. Hepatobiliary: No focal liver abnormality is seen. No gallstones, gallbladder wall thickening, or biliary dilatation. Pancreas: Unremarkable. No pancreatic ductal dilatation or surrounding inflammatory changes. Spleen: Normal in size without focal abnormality. Adrenals/Urinary Tract: Adrenal glands are unremarkable. Kidneys are normal, without renal calculi, focal lesion,  or hydronephrosis. Bladder is unremarkable. Stomach/Bowel: There is edema and mild thickening of the wall of the gastric fundus with hyperenhancement of the mucosa which may represent gastritis. Appendix appears normal. There is colonic diverticulosis without evidence of diverticulitis. No evidence of bowel wall thickening, distention, or inflammatory changes. Vascular/Lymphatic: Aortic atherosclerosis. No enlarged abdominal or pelvic lymph nodes. Reproductive: A mass in the left aspect of the uterus measures 3.0 x 2.5 cm. The adnexa appear normal. Other: No abdominal wall hernia or abnormality. No abdominopelvic ascites. Anasarca and diffuse muscle wasting are noted. Musculoskeletal: There are chronic appearing compression deformities with approximately 25% height loss at L3 and 50% height loss at L4. Degenerative changes are seen in the spine. IMPRESSION: 1. Findings which may reflect gastritis. 2. Mass in the left aspect of the uterus likely represents a uterine fibroid. 3. Chronic appearing compression deformities of L3 and L4. Aortic Atherosclerosis (ICD10-I70.0). Electronically Signed   By: Zerita Boers M.D.   On: 03/31/2021 16:18   VAS [place] ABI WITH/WO TBI  Result Date: 04/02/2021  LOWER EXTREMITY DOPPLER STUDY Patient Name:  Monte Bronder  Date of Exam:   04/02/2021 Medical Rec #: [id number]           Accession #:    [id number] Date of Birth:  30-Nov-1950          Patient Gender: F Patient Age:   79 years Exam Location:  [facility] Procedure:      VAS [place] ABI WITH/WO TBI Referring Phys: RALPH NETTEY --------------------------------------------------------------------------------  Indications: Peripheral artery disease. High Risk Factors: Hypertension.  Comparison Study: No prior studies. Performing Technologist: Carlos Levering RVT  Examination Guidelines: A complete evaluation includes at minimum, Doppler waveform signals and systolic blood pressure reading at the level of bilateral brachial, anterior tibial, and posterior tibial arteries, when vessel segments are accessible. Bilateral testing is considered an integral part of a complete examination. Photoelectric Plethysmograph (PPG) waveforms and toe systolic pressure readings are included as required and additional duplex testing as needed. Limited examinations for reoccurring indications may be performed as noted.  ABI Findings: +---------+------------------+-----+---------+--------+ Right    Rt Pressure (mmHg)IndexWaveform Comment  +---------+------------------+-----+---------+--------+ Brachial 135                    triphasic         +---------+------------------+-----+---------+--------+ PTA      122               0.90 biphasic          +---------+------------------+-----+---------+--------+ DP       116               0.86 biphasic          +---------+------------------+-----+---------+--------+ Great Toe47                0.35                   +---------+------------------+-----+---------+--------+ +---------+------------------+-----+---------+-------+ Left     Lt Pressure (mmHg)IndexWaveform Comment +---------+------------------+-----+---------+-------+ Brachial 130                    triphasic        +---------+------------------+-----+---------+-------+ PTA      162               1.20 biphasic          +---------+------------------+-----+---------+-------+ DP       165  1.22 triphasic        +---------+------------------+-----+---------+-------+ Great Toe59                0.44                  +---------+------------------+-----+---------+-------+ +-------+-----------+-----------+------------+------------+ ABI/TBIToday's ABIToday's TBIPrevious ABIPrevious TBI +-------+-----------+-----------+------------+------------+ Right  0.9        0.35                                +-------+-----------+-----------+------------+------------+ Left   1.22       0.44                                +-------+-----------+-----------+------------+------------+  Summary: Right: Resting right ankle-brachial index indicates mild right lower extremity arterial disease. The right toe-brachial index is abnormal. Left: Resting left ankle-brachial index is within normal range. No evidence of significant left lower extremity arterial disease. The left toe-brachial index is abnormal.  *See table(s) above for measurements and observations.  Electronically signed by Servando Snare MD on 04/02/2021 at 4:52:40 PM.    Final    ECHOCARDIOGRAM COMPLETE  Result Date: 04/21/2021    ECHOCARDIOGRAM REPORT   Patient Name:   TAHEERAH GULDIN Date of Exam: 04/21/2021 Medical Rec #:  [id number]          Height:       61.0 in Accession #:    [id number]         Weight:       117.0 lb Date of Birth:  08-31-1950         BSA:          1.504 m Patient Age:    48 years           BP:           132/80 mmHg Patient Gender: F                  HR:           100 bpm. Exam Location:  Inpatient Procedure: 2D Echo Indications:    Chest pain. Abnormal ecg.  History:        Patient has no prior history of Echocardiogram examinations.                 Pleural effusion; Risk Factors:Hypertension.  Sonographer:    Johny Chess RDCS Referring Phys: [id number] [place]  1. Left ventricular ejection fraction, by  estimation, is 45 to 50%. The left ventricle has low normal function. The left ventricle demonstrates regional wall motion abnormalities (see scoring diagram/findings for description). Left ventricular diastolic  parameters are consistent with Grade I diastolic dysfunction (impaired relaxation).  2. Right ventricular systolic function is normal. The right ventricular size is normal. Tricuspid regurgitation signal is inadequate for assessing PA pressure.  3. Large pleural effusion in the left lateral region.  4. The mitral valve is normal in structure. Trivial mitral valve regurgitation. No evidence of mitral stenosis.  5. The aortic valve is tricuspid. There is mild calcification of the aortic valve. There is mild thickening of the aortic valve. Aortic valve regurgitation is not visualized. No aortic stenosis is present.  6. The inferior vena cava is normal in size with greater than 50% respiratory variability, suggesting right atrial pressure of 3 mmHg. FINDINGS  Left Ventricle: The anteroseptal wall and apex appear  hypokinetic. Left ventricular ejection fraction, by estimation, is 45 to 50%. The left ventricle has low normal function. The left ventricle demonstrates regional wall motion abnormalities. The left ventricular internal cavity size was normal in size. There is no left ventricular hypertrophy. Left ventricular diastolic parameters are consistent with Grade I diastolic dysfunction (impaired relaxation). Normal left ventricular filling pressure. Right Ventricle: The right ventricular size is normal. No increase in right ventricular wall thickness. Right ventricular systolic function is normal. Tricuspid regurgitation signal is inadequate for assessing PA pressure. Left Atrium: Left atrial size was normal in size. Right Atrium: Right atrial size was normal in size. Pericardium: There is no evidence of pericardial effusion. Mitral Valve: The mitral valve is normal in structure. Trivial mitral valve  regurgitation. No evidence of mitral valve stenosis. Tricuspid Valve: The tricuspid valve is normal in structure. Tricuspid valve regurgitation is not demonstrated. No evidence of tricuspid stenosis. Aortic Valve: The aortic valve is tricuspid. There is mild calcification of the aortic valve. There is mild thickening of the aortic valve. There is mild aortic valve annular calcification. Aortic valve regurgitation is not visualized. No aortic stenosis  is present. Aortic valve mean gradient measures 2.5 mmHg. Aortic valve peak gradient measures 5.6 mmHg. Aortic valve area, by VTI measures 2.19 cm. Pulmonic Valve: The pulmonic valve was not well visualized. Pulmonic valve regurgitation is not visualized. No evidence of pulmonic stenosis. Aorta: The aortic root is normal in size and structure. Venous: The inferior vena cava is normal in size with greater than 50% respiratory variability, suggesting right atrial pressure of 3 mmHg. IAS/Shunts: No atrial level shunt detected by color flow Doppler. Additional Comments: There is a large pleural effusion in the left lateral region.  LEFT VENTRICLE PLAX 2D LVIDd:         4.10 cm     Diastology LVIDs:         3.20 cm     LV e' medial:    6.20 cm/s LV PW:         0.90 cm     LV E/e' medial:  7.6 LV IVS:        0.80 cm     LV e' lateral:   5.98 cm/s LVOT diam:     2.00 cm     LV E/e' lateral: 7.9 LV SV:         46 LV SV Index:   30 LVOT Area:     3.14 cm  LV Volumes (MOD) LV vol d, MOD A2C: 50.8 ml LV vol d, MOD A4C: 68.8 ml LV vol s, MOD A2C: 28.6 ml LV vol s, MOD A4C: 45.8 ml LV SV MOD A2C:     22.2 ml LV SV MOD A4C:     68.8 ml LV SV MOD BP:      21.8 ml RIGHT VENTRICLE             IVC RV S prime:     14.70 cm/s  IVC diam: 1.00 cm TAPSE (M-mode): 1.6 cm LEFT ATRIUM             Index        RIGHT ATRIUM           Index LA diam:        2.40 cm 1.60 cm/m   RA Area:     12.10 cm LA Vol (A2C):   23.4 ml 15.56 ml/m  RA Volume:   27.10 ml  18.02 ml/m LA Vol (A4C):  45.2 ml  30.06 ml/m LA Biplane Vol: 34.7 ml 23.07 ml/m  AORTIC VALVE AV Area (Vmax):    2.47 cm AV Area (Vmean):   2.70 cm AV Area (VTI):     2.19 cm AV Vmax:           118.62 cm/s AV Vmean:          70.876 cm/s AV VTI:            0.210 m AV Peak Grad:      5.6 mmHg AV Mean Grad:      2.5 mmHg LVOT Vmax:         93.40 cm/s LVOT Vmean:        60.900 cm/s LVOT VTI:          0.146 m LVOT/AV VTI ratio: 0.70  AORTA Ao Root diam: 2.70 cm Ao Asc diam:  2.90 cm MV E velocity: 47.40 cm/s MV A velocity: 94.70 cm/s  SHUNTS MV E/A ratio:  0.50        Systemic VTI:  0.15 m                            Systemic Diam: 2.00 cm Carlyle Dolly MD Electronically signed by Carlyle Dolly MD Signature Date/Time: 04/21/2021/11:55:23 AM    Final    [place] EKG SITE RITE  Result Date: 04/01/2021 If Site Rite image not attached, placement could not be confirmed due to current cardiac rhythm.  (Echo, Carotid, EGD, Colonoscopy, ERCP)    Subjective: No complaints  Discharge Exam: Vitals:   04/25/21 0347 04/25/21 0820  BP: 120/73 116/75  Pulse: (!) 107 (!) 108  Resp: 17 14  Temp: 97.7 F (36.5 C) 98 F (36.7 C)  SpO2: 90% 100%   Vitals:   04/24/21 1950 04/24/21 2351 04/25/21 0347 04/25/21 0820  BP: 108/79 110/82 120/73 116/75  Pulse: (!) 109 (!) 102 (!) 107 (!) 108  Resp: 17 16 17 14   Temp: 98.1 F (36.7 C) 97.8 F (36.6 C) 97.7 F (36.5 C) 98 F (36.7 C)  TempSrc: Oral Oral Oral Oral  SpO2: 97% 99% 90% 100%  Weight:      Height:        General: Pt is alert, awake, not in acute distress Cardiovascular: RRR, S1/S2 +, no rubs, no gallops Respiratory: CTA bilaterally, no wheezing, no rhonchi Abdominal: Soft, NT, ND, bowel sounds + Extremities: no edema, no cyanosis    The results of significant diagnostics from this hospitalization (including imaging, microbiology, ancillary and laboratory) are listed below for reference.     Microbiology: Recent Results (from the past 240 hour(s))  Resp Panel by RT-PCR  (Flu A&B, Covid) Nasopharyngeal Swab     Status: None   Collection Time: 04/19/21 10:51 PM   Specimen: Nasopharyngeal Swab; Nasopharyngeal(NP) swabs in vial transport medium  Result Value Ref Range Status   SARS Coronavirus 2 by RT PCR NEGATIVE NEGATIVE Final    Comment: (NOTE) SARS-CoV-2 target nucleic acids are NOT DETECTED.  The SARS-CoV-2 RNA is generally detectable in upper respiratory specimens during the acute phase of infection. The lowest concentration of SARS-CoV-2 viral copies this assay can detect is 138 copies/mL. A negative result does not preclude SARS-Cov-2 infection and should not be used as the sole basis for treatment or other patient management decisions. A negative result may occur with  improper specimen collection/handling, submission of specimen other than nasopharyngeal swab, presence of viral mutation(s) within the  areas targeted by this assay, and inadequate number of viral copies(<138 copies/mL). A negative result must be combined with clinical observations, patient history, and epidemiological information. The expected result is Negative.  Fact Sheet for Patients:  EntrepreneurPulse.com.au  Fact Sheet for Healthcare Providers:  IncredibleEmployment.be  This test is no t yet approved or cleared by the [place] FDA and  has been authorized for detection and/or diagnosis of SARS-CoV-2 by FDA under an Emergency Use Authorization (EUA). This EUA will remain  in effect (meaning this test can be used) for the duration of the COVID-19 declaration under Section 564(b)(1) of the Act, 21 U.S.C.section 360bbb-3(b)(1), unless the authorization is terminated  or revoked sooner.       Influenza A by PCR NEGATIVE NEGATIVE Final   Influenza B by PCR NEGATIVE NEGATIVE Final    Comment: (NOTE) The Xpert Xpress SARS-CoV-2/FLU/RSV plus assay is intended as an aid in the diagnosis of influenza from Nasopharyngeal swab specimens  and should not be used as a sole basis for treatment. Nasal washings and aspirates are unacceptable for Xpert Xpress SARS-CoV-2/FLU/RSV testing.  Fact Sheet for Patients: EntrepreneurPulse.com.au  Fact Sheet for Healthcare Providers: IncredibleEmployment.be  This test is not yet approved or cleared by the [place] FDA and has been authorized for detection and/or diagnosis of SARS-CoV-2 by FDA under an Emergency Use Authorization (EUA). This EUA will remain in effect (meaning this test can be used) for the duration of the COVID-19 declaration under Section 564(b)(1) of the Act, 21 U.S.C. section 360bbb-3(b)(1), unless the authorization is terminated or revoked.  Performed at [facility], [place] [address], [place], [place] [address]   Culture, blood (routine x 2)     Status: None (Preliminary result)   Collection Time: 04/23/21  9:16 PM   Specimen: BLOOD  Result Value Ref Range Status   Specimen Description BLOOD RIGHT HAND  Final   Special Requests   Final    BOTTLES DRAWN AEROBIC ONLY Blood Culture results may not be optimal due to an inadequate volume of blood received in culture bottles   Culture   Final    NO GROWTH 2 DAYS Performed at [facility], [place] [address], [place], [place] [address]    Report Status PENDING  Incomplete  Culture, blood (routine x 2)     Status: None (Preliminary result)   Collection Time: 04/23/21  9:25 PM   Specimen: BLOOD  Result Value Ref Range Status   Specimen Description BLOOD RIGHT ARM  Final   Special Requests   Final    BOTTLES DRAWN AEROBIC AND ANAEROBIC Blood Culture adequate volume   Culture   Final    NO GROWTH 2 DAYS Performed at [facility], [place] [address], [place], [place] [address]    Report Status PENDING  Incomplete     Labs: BNP (last 3 results) No results for input(s): BNP in the last 8760 hours. Basic Metabolic Panel: Recent Labs  Lab  04/20/21 0111 04/20/21 0120 04/22/21 0349  NA 138 139 136  K 3.1* 3.5 3.7  CL 101 103 103  CO2  --  25 27  GLUCOSE 76 79 78  BUN 9 10 9   CREATININE 0.50 0.71 0.70  CALCIUM  --  7.7* 7.5*  MG  --   --  1.5*   Liver Function Tests: Recent Labs  Lab 04/20/21 0120  AST 44*  ALT 26  ALKPHOS 167*  BILITOT 1.3*  PROT 5.3*  ALBUMIN 1.8*  No results for input(s): LIPASE, AMYLASE in the last 168 hours. No results for input(s): AMMONIA in the last 168 hours. CBC: Recent Labs  Lab 04/20/21 0100 04/20/21 0111  WBC 7.2  --   NEUTROABS 5.4  --   HGB 13.6 15.3*  HCT 38.2 45.0  MCV 95.3  --   PLT 260  --    Cardiac Enzymes: No results for input(s): CKTOTAL, CKMB, CKMBINDEX, TROPONINI in the last 168 hours. BNP: Invalid input(s): POCBNP CBG: No results for input(s): GLUCAP in the last 168 hours. D-Dimer No results for input(s): DDIMER in the last 72 hours. Hgb A1c No results for input(s): HGBA1C in the last 72 hours. Lipid Profile No results for input(s): CHOL, HDL, LDLCALC, TRIG, CHOLHDL, LDLDIRECT in the last 72 hours. Thyroid function studies No results for input(s): TSH, T4TOTAL, T3FREE, THYROIDAB in the last 72 hours.  Invalid input(s): FREET3 Anemia work up No results for input(s): VITAMINB12, FOLATE, FERRITIN, TIBC, IRON, RETICCTPCT in the last 72 hours. Urinalysis    Component Value Date/Time   COLORURINE AMBER (A) 04/24/2021 0423   APPEARANCEUR HAZY (A) 04/24/2021 0423   LABSPEC 1.023 04/24/2021 0423   PHURINE 6.0 04/24/2021 0423   GLUCOSEU NEGATIVE 04/24/2021 0423   HGBUR MODERATE (A) 04/24/2021 0423   BILIRUBINUR NEGATIVE 04/24/2021 0423   BILIRUBINUR negative 03/30/2020 1215   KETONESUR 5 (A) 04/24/2021 0423   PROTEINUR 100 (A) 04/24/2021 0423   UROBILINOGEN 0.2 03/30/2020 1215   NITRITE NEGATIVE 04/24/2021 0423   LEUKOCYTESUR LARGE (A) 04/24/2021 0423   Sepsis Labs Invalid input(s): PROCALCITONIN,  WBC,  LACTICIDVEN Microbiology Recent Results  (from the past 240 hour(s))  Resp Panel by RT-PCR (Flu A&B, Covid) Nasopharyngeal Swab     Status: None   Collection Time: 04/19/21 10:51 PM   Specimen: Nasopharyngeal Swab; Nasopharyngeal(NP) swabs in vial transport medium  Result Value Ref Range Status   SARS Coronavirus 2 by RT PCR NEGATIVE NEGATIVE Final    Comment: (NOTE) SARS-CoV-2 target nucleic acids are NOT DETECTED.  The SARS-CoV-2 RNA is generally detectable in upper respiratory specimens during the acute phase of infection. The lowest concentration of SARS-CoV-2 viral copies this assay can detect is 138 copies/mL. A negative result does not preclude SARS-Cov-2 infection and should not be used as the sole basis for treatment or other patient management decisions. A negative result may occur with  improper specimen collection/handling, submission of specimen other than nasopharyngeal swab, presence of viral mutation(s) within the areas targeted by this assay, and inadequate number of viral copies(<138 copies/mL). A negative result must be combined with clinical observations, patient history, and epidemiological information. The expected result is Negative.  Fact Sheet for Patients:  EntrepreneurPulse.com.au  Fact Sheet for Healthcare Providers:  IncredibleEmployment.be  This test is no t yet approved or cleared by the [place] FDA and  has been authorized for detection and/or diagnosis of SARS-CoV-2 by FDA under an Emergency Use Authorization (EUA). This EUA will remain  in effect (meaning this test can be used) for the duration of the COVID-19 declaration under Section 564(b)(1) of the Act, 21 U.S.C.section 360bbb-3(b)(1), unless the authorization is terminated  or revoked sooner.       Influenza A by PCR NEGATIVE NEGATIVE Final   Influenza B by PCR NEGATIVE NEGATIVE Final    Comment: (NOTE) The Xpert Xpress SARS-CoV-2/FLU/RSV plus assay is intended as an aid in the  diagnosis of influenza from Nasopharyngeal swab specimens and should not be used as a sole basis for  treatment. Nasal washings and aspirates are unacceptable for Xpert Xpress SARS-CoV-2/FLU/RSV testing.  Fact Sheet for Patients: EntrepreneurPulse.com.au  Fact Sheet for Healthcare Providers: IncredibleEmployment.be  This test is not yet approved or cleared by the [place] FDA and has been authorized for detection and/or diagnosis of SARS-CoV-2 by FDA under an Emergency Use Authorization (EUA). This EUA will remain in effect (meaning this test can be used) for the duration of the COVID-19 declaration under Section 564(b)(1) of the Act, 21 U.S.C. section 360bbb-3(b)(1), unless the authorization is terminated or revoked.  Performed at [facility], [place] [address] [place], [place] [address]   Culture, blood (routine x 2)     Status: None (Preliminary result)   Collection Time: 04/23/21  9:16 PM   Specimen: BLOOD  Result Value Ref Range Status   Specimen Description BLOOD RIGHT HAND  Final   Special Requests   Final    BOTTLES DRAWN AEROBIC ONLY Blood Culture results may not be optimal due to an inadequate volume of blood received in culture bottles   Culture   Final    NO GROWTH 2 DAYS Performed at [facility], [place] [address], [place], [place] [address]    Report Status PENDING  Incomplete  Culture, blood (routine x 2)     Status: None (Preliminary result)   Collection Time: 04/23/21  9:25 PM   Specimen: BLOOD  Result Value Ref Range Status   Specimen Description BLOOD RIGHT ARM  Final   Special Requests   Final    BOTTLES DRAWN AEROBIC AND ANAEROBIC Blood Culture adequate volume   Culture   Final    NO GROWTH 2 DAYS Performed at [facility], [place] [address], [place], [place] [address]    Report Status PENDING  Incomplete    SIGNED:   Charlynne Cousins, MD  [facility] 04/25/2021, 11:10  AM Pager   If 7PM-7AM, please contact night-coverage [email or web address] Password [email or web address]

## 2021-04-25 NOTE — TOC Progression Note (Signed)
Transition of Care [facility]) - Progression Note    Patient Details  Name: Debbie Barry MRN: [id number] Date of Birth: [birth year]  Transition of Care [facility] CM/SW [place], [place] Phone Number: 04/25/2021, 10:14 AM  Clinical Narrative:   CSW uploaded [place] documents to [facility], waiting on review and PASRR number. Bed still available for patient if PASRR is assigned. CSW to follow.    Expected Discharge Plan: Skilled Nursing Facility Barriers to Discharge: [place] [facility])  Expected Discharge Plan and Services Expected Discharge Plan: [place] Choice: [place] arrangements for the past 2 months: Single Family Home Expected Discharge Date: 04/23/21                                     Social Determinants of Health (SDOH) Interventions    Readmission Risk Interventions No flowsheet data found.

## 2021-04-25 NOTE — Progress Notes (Signed)
Patient slept well throughout the night. No complaints of pain, no distress noted. VSS. Bed bath, foley care, and linen change performed this morning.

## 2021-04-25 NOTE — Progress Notes (Signed)
[facility] PROGRESS NOTE    Progress Note  Debbie Barry  NWG:[id number] DOB: 16-Apr-1951 DOA: 04/19/2021 PCP: Glendale Chard, MD     Brief Narrative:   Debbie Barry is an 70 y.o. female past medical history significant for fatty liver, GERD, essential hypertension, osteomyelitis of the right toe, peripheral vascular disease, history of upper GI bleed due to peptic ulcers who was recently discharged from the hospital for gastroenteritis, iron overload and maculopapular rash she is coming in today complaining of progressive weakness both lower extremities, falls and numbness.,  She had a Foley catheter placed for urinary retention couple of weeks ago she has not had a bowel movement in the past week, and MRI of the brain was done showed no acute CVA mild chronic microvascular disease MRI of the lumbar spine nonhealed L3-L4 compression fracture, severe canal stenosis at L4-L5 with severe right and moderate foraminal stenosis, MRI of the thoracic spine show multiple mild foraminal stenosis degenerative changes and at least moderate foraminal stenosis C6-C7 and C7-T1, with a moderate left greater than right pleural effusion.  Neurosurgery was curb sided that they relate that her degenerative stenosis would not be causing any of her acute neurological symptoms.  Additionally as she has a healing compression fracture of the lumbar spine these will need to be fully healed prior to surgery.  In the meantime they recommended an LCL brace and dexamethasone taper for radicular pain.  Assessment/Plan:   Principal Problem: Frequent falls with  Weakness of left lower extremities: Neurosurgery was consulted and states that her degenerative changes may require surgical intervention in the future but is not causing her acute neurological dysfunction and her compression fracture has to be healed prior to surgery.  He did recommend dexamethasone taper. Awaiting passer  approval.  Hypokalemia: Orally now 3.5 we will continue to replete orally recheck in the morning.  Prolonged QTC/history of multifocal atrial tachycardia: Patient relates palpitation twice try to keep potassium greater than 4 magnesium greater than 2.  History of gastritis without esophagitis: Continue PPI and antiemetics.  Skin rash: Continue hydroxyzine.  Essential hypertension: Currently on no antihypertensive medication, her blood pressure is relatively well controlled follow-up with PCP as an outpatient. Keep the patient off antihypertensive medication.  Depression: Continue sertraline.  Severe protein caloric malnutrition: Continue Ensure 3 times daily.  Asymptomatic bacteriuria: He has no urinary symptoms like flank pain, fever or leukocytosis. Blood cultures are negative till date.    DVT prophylaxis: lovenox Family Communication:noen Status is: Observation  The patient remains OBS appropriate and will d/c before 2 midnights.     Code Status:     Code Status Orders  (From admission, onward)           Start     Ordered   04/20/21 0956  Full code  Continuous        04/20/21 0958           Code Status History     Date Active Date Inactive Code Status Order ID Comments User Context   03/31/2021 2100 04/05/2021 0008 Full Code [id number]  Etta Quill, DO ED   08/12/2020 2236 08/15/2020 1635 Full Code [id number]  Dessa Phi, DO ED         IV Access:   Peripheral IV   Procedures and diagnostic studies:   No results found.   Medical Consultants:   None.   Subjective:    Kittie Plater has no complaints  Objective:    Vitals:  04/24/21 1950 04/24/21 2351 04/25/21 0347 04/25/21 0820  BP: 108/79 110/82 120/73 116/75  Pulse: (!) 109 (!) 102 (!) 107 (!) 108  Resp: 17 16 17 14   Temp: 98.1 F (36.7 C) 97.8 F (36.6 C) 97.7 F (36.5 C) 98 F (36.7 C)  TempSrc: Oral Oral Oral Oral  SpO2: 97% 99% 90% 100%  Weight:       Height:       SpO2: 100 %   Intake/Output Summary (Last 24 hours) at 04/25/2021 0949 Last data filed at 04/25/2021 0615 Gross per 24 hour  Intake 60 ml  Output 400 ml  Net -340 ml    Filed Weights   04/19/21 2130  Weight: 53.1 kg    Exam: General exam: In no acute distress. Respiratory system: Good air movement and clear to auscultation. Cardiovascular system: S1 & S2 heard, RRR. No JVD. Gastrointestinal system: Abdomen is nondistended, soft and nontender.  Extremities: No pedal edema. Skin: No rashes, lesions or ulcers  Data Reviewed:    Labs: Basic Metabolic Panel: Recent Labs  Lab 04/20/21 0111 04/20/21 0120 04/22/21 0349  NA 138 139 136  K 3.1* 3.5 3.7  CL 101 103 103  CO2  --  25 27  GLUCOSE 76 79 78  BUN 9 10 9   CREATININE 0.50 0.71 0.70  CALCIUM  --  7.7* 7.5*  MG  --   --  1.5*    GFR Estimated Creatinine Clearance: 51.5 mL/min (by C-G formula based on SCr of 0.7 mg/dL). Liver Function Tests: Recent Labs  Lab 04/20/21 0120  AST 44*  ALT 26  ALKPHOS 167*  BILITOT 1.3*  PROT 5.3*  ALBUMIN 1.8*    No results for input(s): LIPASE, AMYLASE in the last 168 hours. No results for input(s): AMMONIA in the last 168 hours. Coagulation profile Recent Labs  Lab 04/20/21 0120  INR 1.0    COVID-19 Labs  No results for input(s): DDIMER, FERRITIN, LDH, CRP in the last 72 hours.  Lab Results  Component Value Date   SARSCOV2NAA NEGATIVE 04/19/2021   SARSCOV2NAA NEGATIVE 03/31/2021   [place] NEGATIVE 08/12/2020   [place] Not Detected 03/14/2020    CBC: Recent Labs  Lab 04/20/21 0100 04/20/21 0111  WBC 7.2  --   NEUTROABS 5.4  --   HGB 13.6 15.3*  HCT 38.2 45.0  MCV 95.3  --   PLT 260  --     Cardiac Enzymes: No results for input(s): CKTOTAL, CKMB, CKMBINDEX, TROPONINI in the last 168 hours. BNP (last 3 results) No results for input(s): PROBNP in the last 8760 hours. CBG: No results for input(s): GLUCAP in the last 168  hours. D-Dimer: No results for input(s): DDIMER in the last 72 hours. Hgb A1c: No results for input(s): HGBA1C in the last 72 hours. Lipid Profile: No results for input(s): CHOL, HDL, LDLCALC, TRIG, CHOLHDL, LDLDIRECT in the last 72 hours. Thyroid function studies: No results for input(s): TSH, T4TOTAL, T3FREE, THYROIDAB in the last 72 hours.  Invalid input(s): FREET3 Anemia work up: No results for input(s): VITAMINB12, FOLATE, FERRITIN, TIBC, IRON, RETICCTPCT in the last 72 hours. Sepsis Labs: Recent Labs  Lab 04/20/21 0100  WBC 7.2    Microbiology Recent Results (from the past 240 hour(s))  Resp Panel by RT-PCR (Flu A&B, Covid) Nasopharyngeal Swab     Status: None   Collection Time: 04/19/21 10:51 PM   Specimen: Nasopharyngeal Swab; Nasopharyngeal(NP) swabs in vial transport medium  Result Value Ref Range Status  SARS Coronavirus 2 by RT PCR NEGATIVE NEGATIVE Final    Comment: (NOTE) SARS-CoV-2 target nucleic acids are NOT DETECTED.  The SARS-CoV-2 RNA is generally detectable in upper respiratory specimens during the acute phase of infection. The lowest concentration of SARS-CoV-2 viral copies this assay can detect is 138 copies/mL. A negative result does not preclude SARS-Cov-2 infection and should not be used as the sole basis for treatment or other patient management decisions. A negative result may occur with  improper specimen collection/handling, submission of specimen other than nasopharyngeal swab, presence of viral mutation(s) within the areas targeted by this assay, and inadequate number of viral copies(<138 copies/mL). A negative result must be combined with clinical observations, patient history, and epidemiological information. The expected result is Negative.  Fact Sheet for Patients:  EntrepreneurPulse.com.au  Fact Sheet for Healthcare Providers:  IncredibleEmployment.be  This test is no t yet approved or cleared by  the [place] FDA and  has been authorized for detection and/or diagnosis of SARS-CoV-2 by FDA under an Emergency Use Authorization (EUA). This EUA will remain  in effect (meaning this test can be used) for the duration of the COVID-19 declaration under Section 564(b)(1) of the Act, 21 U.S.C.section 360bbb-3(b)(1), unless the authorization is terminated  or revoked sooner.       Influenza A by PCR NEGATIVE NEGATIVE Final   Influenza B by PCR NEGATIVE NEGATIVE Final    Comment: (NOTE) The Xpert Xpress SARS-CoV-2/FLU/RSV plus assay is intended as an aid in the diagnosis of influenza from Nasopharyngeal swab specimens and should not be used as a sole basis for treatment. Nasal washings and aspirates are unacceptable for Xpert Xpress SARS-CoV-2/FLU/RSV testing.  Fact Sheet for Patients: EntrepreneurPulse.com.au  Fact Sheet for Healthcare Providers: IncredibleEmployment.be  This test is not yet approved or cleared by the [place] FDA and has been authorized for detection and/or diagnosis of SARS-CoV-2 by FDA under an Emergency Use Authorization (EUA). This EUA will remain in effect (meaning this test can be used) for the duration of the COVID-19 declaration under Section 564(b)(1) of the Act, 21 U.S.C. section 360bbb-3(b)(1), unless the authorization is terminated or revoked.  Performed at [facility], [place] [address], [place], [place] [address]   Culture, blood (routine x 2)     Status: None (Preliminary result)   Collection Time: 04/23/21  9:16 PM   Specimen: BLOOD  Result Value Ref Range Status   Specimen Description BLOOD RIGHT HAND  Final   Special Requests   Final    BOTTLES DRAWN AEROBIC ONLY Blood Culture results may not be optimal due to an inadequate volume of blood received in culture bottles   Culture   Final    NO GROWTH 2 DAYS Performed at [facility], [place] [address], [place], [place]  [address]    Report Status PENDING  Incomplete  Culture, blood (routine x 2)     Status: None (Preliminary result)   Collection Time: 04/23/21  9:25 PM   Specimen: BLOOD  Result Value Ref Range Status   Specimen Description BLOOD RIGHT ARM  Final   Special Requests   Final    BOTTLES DRAWN AEROBIC AND ANAEROBIC Blood Culture adequate volume   Culture   Final    NO GROWTH 2 DAYS Performed at [facility], [place] [address], [place], [place] [address]    Report Status PENDING  Incomplete     Medications:    Chlorhexidine Gluconate Cloth  6 each Topical  Daily   dexamethasone  4 mg Oral Q12H   feeding supplement  237 mL Oral TID BM   hydrOXYzine  50 mg Oral TID   pantoprazole  40 mg Oral Daily   sertraline  25 mg Oral Daily   sodium chloride flush  10-40 mL Intracatheter Q12H   Continuous Infusions:  sodium chloride 75 mL/hr at 04/24/21 2341      LOS: 0 days   Charlynne Cousins  [facility]  04/25/2021, 9:49 AM

## 2021-04-26 ENCOUNTER — Non-Acute Institutional Stay (SKILLED_NURSING_FACILITY): Payer: Medicare HMO | Admitting: Adult Health

## 2021-04-26 ENCOUNTER — Encounter: Payer: Self-pay | Admitting: Adult Health

## 2021-04-26 DIAGNOSIS — I10 Essential (primary) hypertension: Secondary | ICD-10-CM | POA: Diagnosis not present

## 2021-04-26 DIAGNOSIS — R339 Retention of urine, unspecified: Secondary | ICD-10-CM

## 2021-04-26 DIAGNOSIS — E43 Unspecified severe protein-calorie malnutrition: Secondary | ICD-10-CM

## 2021-04-26 DIAGNOSIS — R29898 Other symptoms and signs involving the musculoskeletal system: Secondary | ICD-10-CM | POA: Diagnosis not present

## 2021-04-26 DIAGNOSIS — L299 Pruritus, unspecified: Secondary | ICD-10-CM | POA: Diagnosis not present

## 2021-04-26 DIAGNOSIS — Z8719 Personal history of other diseases of the digestive system: Secondary | ICD-10-CM

## 2021-04-26 DIAGNOSIS — E876 Hypokalemia: Secondary | ICD-10-CM

## 2021-04-26 DIAGNOSIS — F339 Major depressive disorder, recurrent, unspecified: Secondary | ICD-10-CM

## 2021-04-26 DIAGNOSIS — R600 Localized edema: Secondary | ICD-10-CM | POA: Diagnosis not present

## 2021-04-26 MED ORDER — HYDROXYZINE PAMOATE 25 MG PO CAPS: 25.0000 mg | ORAL_CAPSULE | Freq: Two times a day (BID) | ORAL | 0 refills | Status: DC | PRN

## 2021-04-26 NOTE — Progress Notes (Addendum)
Location:  [place] Room Number: [place] of Service:  SNF (31) Provider:  Durenda Age, DNP, FNP-BC  Patient Care Team: Glendale Chard, MD as PCP - General (Internal Medicine) Magrinat, Virgie Dad, MD as Consulting Physician (Oncology) Clarene Essex, MD as Consulting Physician (Gastroenterology)  Extended Emergency Contact Information Primary Emergency Contact: Clair Gulling Address: [address]          [place], [place] [address] Johnnette Litter of [place] Phone: [phone] Mobile Phone: [phone] Relation: Spouse  Code Status:    Goals of care: Advanced Directive information Advanced Directives 04/19/2021  Does Patient Have a Medical Advance Directive? No  Would patient like information on creating a medical advance directive? No - Patient declined     Chief Complaint  Patient presents with   Hospitalization Follow-up    New admit to [facility]    HPI:  Pt is a 70 y.o. female who was admitted to [facility] on 04/25/21 post hospital admission 04/19/21 to 04/25/21. She has a PMH  significant fatty liver, GERD, essential hypertension and osteomyelitis of the right toe, peripheral vascular disease and history of upper GI bleed due to peptic ulcers.  She was recently discharged from the hospital on 04/04/2021 for gastroenteritis, iron overload and maculopapular rash.  She had a foley catheter placement due to urinary retention a couple of weeks ago. She was re-admitted to the hospital on 04/19/21 with complaints of progressive worsening weakness to BLE, LLE > RLE. She was reported to have falls due to her LLE giving up.  MRI of the brain done showed no acute CVA mild chronic microvascular disease.  MRI of the lumbar spine showed non healed L3-L4 compression fracture, severe canal stenosis of L4-L5 with severe right and moderate foraminal stenosis.  MRI of the thoracic spine showed multiple  mild foraminal stenosis degenerative changes and at least moderate foraminal stenosis C6-C7 and C7-T1 with a moderate left greater than right pleural effusion.  Neurosurgery relates that her degenerative stenosis would not be causing any of her acute neurological symptoms and that she has a healing compression of the lumbar spine.  Lumbar spine fracture need to heal prior to surgery.  LCL brace and dexamethasone taper was ordered for radicular pain.  She was seen in the room today. PT reportedly stood her up but did not ambulate. She denies having pain at that time but reported having pain on her shoulders and lower back at night. She was noted to have whitish coating on her tongue. She lives with her husband at home.  Past Medical History:  Diagnosis Date   Fatty liver    GERD (gastroesophageal reflux disease)    Hypertension    Osteomyelitis ([place])    right third toe   Peripheral vascular disease ([place])    Ulcer 08/14/20   Past Surgical History:  Procedure Laterality Date   AMPUTATION Right 08/08/2017   Procedure: AMPUTATION RIGHT 3RD TOE;  Surgeon: Newt Minion, MD;  Location: [place];  Service: Orthopedics;  Laterality: Right;   BIOPSY  08/14/2020   Procedure: BIOPSY;  Surgeon: Clarene Essex, MD;  Location: [facility];  Service: Endoscopy;;   Bunionectomy Right 2006     Right   Bunionectony Left 2006      August   COLONOSCOPY N/A 01/21/2014   Procedure: COLONOSCOPY;  Surgeon: Beryle Beams, MD;  Location: [facility];  Service: Endoscopy;  Laterality: N/A;   ESOPHAGOGASTRODUODENOSCOPY (EGD) WITH PROPOFOL N/A  08/14/2020   Procedure: ESOPHAGOGASTRODUODENOSCOPY (EGD) WITH PROPOFOL;  Surgeon: Clarene Essex, MD;  Location: [facility];  Service: Endoscopy;  Laterality: N/A;   TUBAL LIGATION     WISDOM TOOTH EXTRACTION      No Known Allergies  Outpatient Encounter Medications as of 04/26/2021  Medication Sig   Cholecalciferol (VITAMIN D3) 5000 units CAPS Take 5,000 Units by mouth daily.    dexamethasone (DECADRON) 1 MG tablet Take 1 tablet (1 mg total) by mouth every 12 (twelve) hours for 2 days.   feeding supplement (ENSURE ENLIVE / ENSURE PLUS) LIQD Take 237 mLs by mouth 3 (three) times daily between meals.   hydrOXYzine (ATARAX/VISTARIL) 50 MG tablet Take 1 tablet (50 mg total) by mouth 3 (three) times daily.   Multiple Vitamin (MULTIVITAMIN WITH MINERALS) TABS tablet Take 1 tablet by mouth daily.   ondansetron (ZOFRAN ODT) 4 MG disintegrating tablet Take 1 tablet (4 mg total) by mouth every 8 (eight) hours as needed for nausea or vomiting.   pantoprazole (PROTONIX) 40 MG tablet Take 1 tablet (40 mg total) by mouth daily.   potassium chloride SA (KLOR-CON) 20 MEQ tablet Take 1 tablet (20 mEq total) by mouth 2 (two) times daily. (Patient taking differently: Take 20 mEq by mouth daily.)   sertraline (ZOLOFT) 25 MG tablet Take 1 tablet (25 mg total) by mouth daily.   Tetrahydrozoline HCl (VISINE OP) Place 2 drops into both eyes daily as needed (for dry/irritated eyes).    No facility-administered encounter medications on file as of 04/26/2021.    Review of Systems  GENERAL: No change in appetite, no fatigue, no weight changes, no fever or chills  MOUTH and THROAT: Denies oral discomfort, gingival pain or bleeding RESPIRATORY: no cough, SOB, DOE, wheezing, hemoptysis CARDIAC: No chest pain, edema or palpitations GI: No abdominal pain, diarrhea, constipation, heart burn, nausea or vomiting NEUROLOGICAL: Denies dizziness, syncope or headache PSYCHIATRIC: Denies feelings of depression or anxiety. No report of hallucinations, insomnia, paranoia, or agitation   Immunization History  Administered Date(s) Administered   Influenza Split 07/11/2014   Influenza, High Dose Seasonal PF 03/23/2019   Influenza, Quadrivalent, Recombinant, Inj, Pf 03/30/2018   Influenza,inj,Quad PF,6+ Mos 03/23/2017   PFIZER(Purple Top)SARS-COV-2 Vaccination 09/16/2019, 10/07/2019   Pneumococcal  Polysaccharide-23 03/23/2019   Zoster Recombinat (Shingrix) 05/28/2019, 08/03/2019   Pertinent  Health Maintenance Due  Topic Date Due   INFLUENZA VACCINE  01/29/2021   MAMMOGRAM  12/14/2022   COLONOSCOPY (Pts 45-25yrs Insurance coverage will need to be confirmed)  01/22/2024   DEXA SCAN  Completed   Fall Risk 04/23/2021 04/23/2021 04/24/2021 04/24/2021 04/25/2021  Falls in the past year? - - - - -  Was there an injury with Fall? - - - - -  Fall Risk Category Calculator - - - - -  Fall Risk Category - - - - -  Patient Fall Risk Level High fall risk High fall risk High fall risk High fall risk High fall risk  Patient at Risk for Falls Due to - - - - -  Fall risk Follow up - - - - -     Vitals:   04/26/21 1136  BP: 118/72  Pulse: (!) 104  Resp: 20  Temp: (!) 97.5 F (36.4 C)  Weight: 117 lb (53.1 kg)  Height: 5\' 1"  (1.549 m)   Body mass index is 22.11 kg/m.  Physical Exam  GENERAL APPEARANCE: Well nourished. In no acute distress. Normal body habitus SKIN:  Left buttocks with excoriation and  maceration; left lateral ankle with pressure injury stage 2 MOUTH and THROAT: Lips are without lesions. Tongue with whitish coating RESPIRATORY: Breathing is even & unlabored, BS CTAB CARDIAC: RRR, no murmur,no extra heart sounds, Left upper extremity 3+ edema GI: Abdomen soft, normal BS, no masses, no tenderness NEUROLOGICAL: There is no tremor. Speech is clear. Alert and oriented X 3. PSYCHIATRIC:  Affect and behavior are appropriate  Labs reviewed: Recent Labs    03/31/21 1817 04/01/21 1837 04/02/21 1110 04/04/21 0956 04/20/21 0111 04/20/21 0120 04/22/21 0349  NA  --  138   < > 139 138 139 136  K  --  3.0*   < > 4.7 3.1* 3.5 3.7  CL  --  104   < > 109 101 103 103  CO2  --  21*   < > 21*  --  25 27  GLUCOSE  --  96   < > 66* 76 79 78  BUN  --  10   < > 6* 9 10 9   CREATININE  --  0.89   < > 0.57 0.50 0.71 0.70  CALCIUM  --  7.7*   < > 7.9*  --  7.7* 7.5*  MG 1.6* 1.7   --   --   --   --  1.5*   < > = values in this interval not displayed.   Recent Labs    03/31/21 1349 04/01/21 1837 04/20/21 0120  AST 39 39 44*  ALT 47* 34 26  ALKPHOS 119 103 167*  BILITOT 1.1 0.8 1.3*  PROT 6.0* 5.1* 5.3*  ALBUMIN 2.6* 2.1* 1.8*   Recent Labs    02/13/21 1618 03/31/21 1338 04/01/21 0513 04/20/21 0100 04/20/21 0111  WBC 5.4 4.0 7.4 7.2  --   NEUTROABS 3.2 3.0  --  5.4  --   HGB 11.4* 13.6 15.9* 13.6 15.3*  HCT 32.3* 39.5 45.6 38.2 45.0  MCV 93.4 100.5* 99.3 95.3  --   PLT 192 169 164 260  --    Lab Results  Component Value Date   TSH 3.190 03/31/2021   Lab Results  Component Value Date   HGBA1C 4.5 (L) 02/01/2021   Lab Results  Component Value Date   CHOL 146 08/22/2020   HDL 53 08/22/2020   LDLCALC 77 08/22/2020   TRIG 83 08/22/2020   CHOLHDL 2.8 08/22/2020    Significant Diagnostic Results in last 30 days:  CT HEAD WO CONTRAST (5MM)  Result Date: 04/19/2021 CLINICAL DATA:  Neuro deficit, acute, stroke suspected EXAM: CT HEAD WITHOUT CONTRAST TECHNIQUE: Contiguous axial images were obtained from the base of the skull through the vertex without intravenous contrast. COMPARISON:  02/22/2021 FINDINGS: Brain: There is atrophy and chronic small vessel disease changes. No acute intracranial abnormality. Specifically, no hemorrhage, hydrocephalus, mass lesion, acute infarction, or significant intracranial injury. Vascular: No hyperdense vessel or unexpected calcification. Skull: No acute calvarial abnormality. Sinuses/Orbits: No acute findings Other: None IMPRESSION: Atrophy, chronic microvascular disease. No acute intracranial abnormality. Electronically Signed   By: Rolm Baptise M.D.   On: 04/19/2021 22:52   MR BRAIN WO CONTRAST  Result Date: 04/20/2021 CLINICAL DATA:  Neuro deficit, acute, stroke suspected EXAM: MRI HEAD WITHOUT CONTRAST TECHNIQUE: Multiplanar, multiecho pulse sequences of the brain and surrounding structures were obtained  without intravenous contrast. COMPARISON:  CT head10/20/2022. FINDINGS: Brain: No acute infarction, hemorrhage, hydrocephalus, extra-axial collection or mass lesion. Mild scattered T2 hyperintensities in the white matter, nonspecific but compatible with chronic microvascular  disease. Mild atrophy. Vascular: Major arterial flow voids are maintained at the skull base. Skull and upper cervical spine: Normal marrow signal. Sinuses/Orbits: Clear sinuses.  Unremarkable orbits Other: Small right and trace left mastoid effusions. IMPRESSION: 1. No evidence of acute intracranial abnormality. 2. Mild chronic microvascular disease and atrophy. Electronically Signed   By: Margaretha Sheffield M.D.   On: 04/20/2021 06:55   MR CERVICAL SPINE WO CONTRAST  Result Date: 04/22/2021 CLINICAL DATA:  Motor neuron disease. Frequent falls with weakness of left lower extremities. 70 y.o. female past medical history significant for fatty liver, GERD, essential hypertension, osteomyelitis of the right toe, peripheral vascular disease, history of upper GI bleed due to peptic ulcers who was recently discharged from the hospital for gastroenteritis, iron overload and maculopapular rash she is coming in today complaining of progressive weakness both lower extremities, falls and numbness. EXAM: MRI CERVICAL SPINE WITHOUT CONTRAST TECHNIQUE: Multiplanar, multisequence MR imaging of the cervical spine was performed. No intravenous contrast was administered. COMPARISON:  Limited correlation made with cranial and thoracic MRI 01/18/2021. FINDINGS: Alignment: Near anatomic. There is a slight anterolisthesis at C4-5 and C7-T1. Vertebrae: No acute or suspicious osseous findings. Cord: Normal in signal and caliber. Posterior Fossa, vertebral arteries, paraspinal tissues: Visualized portions of the posterior fossa appear unremarkable. No significant paraspinal findings. Bilateral vertebral artery flow voids. Disc levels: C2-3: Mild disc bulging with  moderate bilateral facet hypertrophy. The central spinal canal is widely patent. Moderate right and mild left foraminal narrowing. C3-4: Mild disc bulging with bilateral uncinate spurring. Moderate to severe bilateral facet hypertrophy, worse on the right. The CSF surrounding the cord is partially effaced without cord deformity. Moderate to severe right and mild left foraminal narrowing. C4-5: There is loss of disc height with annular disc bulging and uncinate spurring. Moderate to severe facet hypertrophy bilaterally. The CSF surrounding the cord is effaced without cord deformity. Moderate to severe foraminal narrowing bilaterally which could affect either C5 nerve root. C5-6: Spondylosis with loss of disc height and posterior osteophytes asymmetric the right. Milder facet degenerative changes at this level. No cord deformity. Moderate to severe right and mild left foraminal narrowing. C6-7: Spondylosis with loss of disc height and posterior osteophytes asymmetric to the right. The CSF surrounding the cord is partially effaced with mild right-sided cord flattening. Moderate to severe right and moderate left foraminal narrowing. C7-T1: Mild loss of disc height with disc bulging, uncinate spurring and bilateral facet hypertrophy. The CSF surrounding the cord is partially effaced without cord deformity. Moderate to severe foraminal narrowing bilaterally. The visualized upper thoracic spine appears unchanged from the recent thoracic MRI. IMPRESSION: 1. Multilevel cervical spondylosis with disc bulging, uncinate spurring and facet hypertrophy as described. 2. There is mild central spinal stenosis at multiple levels as detailed above. There is mild cord flattening on the right at C6-7. No abnormal cord signal. 3. Multilevel significant osseous foraminal narrowing which could affect the exiting nerve roots. 4. No acute findings demonstrated. Electronically Signed   By: Richardean Sale M.D.   On: 04/22/2021 10:52   MR  THORACIC SPINE WO CONTRAST  Result Date: 04/20/2021 CLINICAL DATA:  Ataxia, nontraumatic, T-spine pathology suspected; Low back pain, cauda equina syndrome suspected EXAM: MRI THORACIC AND LUMBAR SPINE WITHOUT CONTRAST TECHNIQUE: Multiplanar and multiecho pulse sequences of the thoracic and lumbar spine were obtained without intravenous contrast. COMPARISON:  CT lumbar spine 02/22/2021. FINDINGS: MRI THORACIC SPINE FINDINGS Alignment:  Slight anterolisthesis of T1 on T2 and T2 on T3. Vertebrae:  Vertebral body heights are maintained.Multilevel degenerative/discogenic endplate signal changes, greatest at T9-T10 where there is disc height loss. Otherwise, no focal marrow edema to suggest acute fracture or discitis/osteomyelitis. Heterogeneous bone marrow without suspicious bone lesion. Cord:  Normal cord signal. Paraspinal and other soft tissues: Moderate left greater than right pleural effusions. Disc levels: Disc bulges and ligamentum flavum thickening at multiple levels throughout the imaged lower cervical and thoracic spine. Although partially imaged on sagittal imaging, probably mild to moderate canal stenosis and potentially at least moderate foraminal stenosis at C6-C7 and C7-T1. Mild canal stenosis at T5-T6, T7-T8, and T10-T11. Multilevel facet arthropathy. Foraminal stenosis is greatest and moderate on the right at T10-T11. Otherwise, multilevel mild foraminal stenosis in the thoracic spine. MRI LUMBAR SPINE FINDINGS Segmentation:  Standard Alignment: Similar grade 1 anterolisthesis of L2 on L3, L3 on L4, and L4 on L5. Vertebrae: Redemonstrated L3 and L4 compression fractures with similar height loss (35% L3 and 50% at L4). Edema within the superior endplates at these levels suggests that the fractures are unhealed. Edema within the L4 posterior elements. No substantial retropulsion. Conus medullaris and cauda equina: Conus extends to the T12-L1 level. Conus appears normal. Paraspinal and other soft  tissues: Paraspinal muscular atrophy. Disc levels: T12-L1: No significant disc protrusion, foraminal stenosis, or canal stenosis. L1-L2: No significant disc protrusion, foraminal stenosis, or canal stenosis. L2-L3: Grade 1 anterolisthesis. Left eccentric disc bulge, bulky ligamentum flavum thickening and mild facet arthropathy. Prominent dorsal epidural fat. Resulting moderate canal stenosis without significant foraminal stenosis. L3-L4: Grade 1 anterolisthesis. Left eccentric disc bulge with bulky ligamentum flavum thickening and moderate bilateral facet arthropathy. Prominent dorsal epidural fat. Resulting moderate to severe canal stenosis without significant foraminal stenosis. L4-L5: Grade 1 anterolisthesis. Uncovering the disc with uncovering the disc and superimposed broad disc bulge. Severe bilateral facet arthropathy with bulky ligamentum flavum thickening. Resulting severe canal stenosis. Severe right and moderate left foraminal stenosis. L5-S1: Mild broad disc bulge and endplate spurring with mild-to-moderate bilateral facet arthropathy. Resulting mild bilateral foraminal stenosis and mild bilateral subarticular recess stenosis without significant canal stenosis. IMPRESSION: MR LUMBAR SPINE IMPRESSION 1. Unhealed L3 and L4 compression fractures with similar height loss and marrow edema along the superior endplates. Edema within the L4 posterior elements could be posttraumatic or degenerative in etiology. 2. At L4-L5, severe canal stenosis with severe right and moderate left foraminal stenosis. 3. Moderate to severe canal stenosis at L3-L4 and moderate canal stenosis at L2-L3. MR THORACIC SPINE IMPRESSION 1. Moderate foraminal stenosis on the right at T10-T11. Otherwise, multilevel mild foraminal stenosis in the thoracic spine. 2. Multilevel degenerative disc disease and mild canal stenosis in the thoracic spine. 3. Potentially mild-to-moderate canal stenosis and at least moderate foraminal stenosis C6-C7  and C7-T1, partially only imaged on sagittal and incompletely assessed. Dedicated MRI of the cervical spine could further characterize if clinically indicated. 4. Moderate left greater than right pleural effusions. Consider dedicated chest imaging. Electronically Signed   By: Margaretha Sheffield M.D.   On: 04/20/2021 07:46   MR LUMBAR SPINE WO CONTRAST  Result Date: 04/20/2021 CLINICAL DATA:  Ataxia, nontraumatic, T-spine pathology suspected; Low back pain, cauda equina syndrome suspected EXAM: MRI THORACIC AND LUMBAR SPINE WITHOUT CONTRAST TECHNIQUE: Multiplanar and multiecho pulse sequences of the thoracic and lumbar spine were obtained without intravenous contrast. COMPARISON:  CT lumbar spine 02/22/2021. FINDINGS: MRI THORACIC SPINE FINDINGS Alignment:  Slight anterolisthesis of T1 on T2 and T2 on T3. Vertebrae: Vertebral body heights are maintained.Multilevel degenerative/discogenic endplate  signal changes, greatest at T9-T10 where there is disc height loss. Otherwise, no focal marrow edema to suggest acute fracture or discitis/osteomyelitis. Heterogeneous bone marrow without suspicious bone lesion. Cord:  Normal cord signal. Paraspinal and other soft tissues: Moderate left greater than right pleural effusions. Disc levels: Disc bulges and ligamentum flavum thickening at multiple levels throughout the imaged lower cervical and thoracic spine. Although partially imaged on sagittal imaging, probably mild to moderate canal stenosis and potentially at least moderate foraminal stenosis at C6-C7 and C7-T1. Mild canal stenosis at T5-T6, T7-T8, and T10-T11. Multilevel facet arthropathy. Foraminal stenosis is greatest and moderate on the right at T10-T11. Otherwise, multilevel mild foraminal stenosis in the thoracic spine. MRI LUMBAR SPINE FINDINGS Segmentation:  Standard Alignment: Similar grade 1 anterolisthesis of L2 on L3, L3 on L4, and L4 on L5. Vertebrae: Redemonstrated L3 and L4 compression fractures with  similar height loss (35% L3 and 50% at L4). Edema within the superior endplates at these levels suggests that the fractures are unhealed. Edema within the L4 posterior elements. No substantial retropulsion. Conus medullaris and cauda equina: Conus extends to the T12-L1 level. Conus appears normal. Paraspinal and other soft tissues: Paraspinal muscular atrophy. Disc levels: T12-L1: No significant disc protrusion, foraminal stenosis, or canal stenosis. L1-L2: No significant disc protrusion, foraminal stenosis, or canal stenosis. L2-L3: Grade 1 anterolisthesis. Left eccentric disc bulge, bulky ligamentum flavum thickening and mild facet arthropathy. Prominent dorsal epidural fat. Resulting moderate canal stenosis without significant foraminal stenosis. L3-L4: Grade 1 anterolisthesis. Left eccentric disc bulge with bulky ligamentum flavum thickening and moderate bilateral facet arthropathy. Prominent dorsal epidural fat. Resulting moderate to severe canal stenosis without significant foraminal stenosis. L4-L5: Grade 1 anterolisthesis. Uncovering the disc with uncovering the disc and superimposed broad disc bulge. Severe bilateral facet arthropathy with bulky ligamentum flavum thickening. Resulting severe canal stenosis. Severe right and moderate left foraminal stenosis. L5-S1: Mild broad disc bulge and endplate spurring with mild-to-moderate bilateral facet arthropathy. Resulting mild bilateral foraminal stenosis and mild bilateral subarticular recess stenosis without significant canal stenosis. IMPRESSION: MR LUMBAR SPINE IMPRESSION 1. Unhealed L3 and L4 compression fractures with similar height loss and marrow edema along the superior endplates. Edema within the L4 posterior elements could be posttraumatic or degenerative in etiology. 2. At L4-L5, severe canal stenosis with severe right and moderate left foraminal stenosis. 3. Moderate to severe canal stenosis at L3-L4 and moderate canal stenosis at L2-L3. MR THORACIC  SPINE IMPRESSION 1. Moderate foraminal stenosis on the right at T10-T11. Otherwise, multilevel mild foraminal stenosis in the thoracic spine. 2. Multilevel degenerative disc disease and mild canal stenosis in the thoracic spine. 3. Potentially mild-to-moderate canal stenosis and at least moderate foraminal stenosis C6-C7 and C7-T1, partially only imaged on sagittal and incompletely assessed. Dedicated MRI of the cervical spine could further characterize if clinically indicated. 4. Moderate left greater than right pleural effusions. Consider dedicated chest imaging. Electronically Signed   By: Margaretha Sheffield M.D.   On: 04/20/2021 07:46   CT ABDOMEN PELVIS W CONTRAST  Result Date: 03/31/2021 CLINICAL DATA:  Acute abdominal pain. EXAM: CT ABDOMEN AND PELVIS WITH CONTRAST TECHNIQUE: Multidetector CT imaging of the abdomen and pelvis was performed using the standard protocol following bolus administration of intravenous contrast. CONTRAST:  73mL OMNIPAQUE IOHEXOL 350 MG/ML SOLN COMPARISON:  Abdominal ultrasound dated 10/27/2015. FINDINGS: Lower chest: There is mild bibasilar atelectasis. The heart is enlarged. Hepatobiliary: No focal liver abnormality is seen. No gallstones, gallbladder wall thickening, or biliary dilatation. Pancreas: Unremarkable. No  pancreatic ductal dilatation or surrounding inflammatory changes. Spleen: Normal in size without focal abnormality. Adrenals/Urinary Tract: Adrenal glands are unremarkable. Kidneys are normal, without renal calculi, focal lesion, or hydronephrosis. Bladder is unremarkable. Stomach/Bowel: There is edema and mild thickening of the wall of the gastric fundus with hyperenhancement of the mucosa which may represent gastritis. Appendix appears normal. There is colonic diverticulosis without evidence of diverticulitis. No evidence of bowel wall thickening, distention, or inflammatory changes. Vascular/Lymphatic: Aortic atherosclerosis. No enlarged abdominal or pelvic lymph  nodes. Reproductive: A mass in the left aspect of the uterus measures 3.0 x 2.5 cm. The adnexa appear normal. Other: No abdominal wall hernia or abnormality. No abdominopelvic ascites. Anasarca and diffuse muscle wasting are noted. Musculoskeletal: There are chronic appearing compression deformities with approximately 25% height loss at L3 and 50% height loss at L4. Degenerative changes are seen in the spine. IMPRESSION: 1. Findings which may reflect gastritis. 2. Mass in the left aspect of the uterus likely represents a uterine fibroid. 3. Chronic appearing compression deformities of L3 and L4. Aortic Atherosclerosis (ICD10-I70.0). Electronically Signed   By: Zerita Boers M.D.   On: 03/31/2021 16:18   VAS [place] ABI WITH/WO TBI  Result Date: 04/02/2021  LOWER EXTREMITY DOPPLER STUDY Patient Name:  S[place] [place]  Date of Exam:   04/02/2021 Medical Rec #: [id number]           Accession #:    [id number] Date of Birth: 08-31-1950          Patient Gender: F Patient Age:   10 years Exam Location:  [facility] Procedure:      VAS [place] ABI WITH/WO TBI Referring Phys: RALPH NETTEY --------------------------------------------------------------------------------  Indications: Peripheral artery disease. High Risk Factors: Hypertension.  Comparison Study: No prior studies. Performing Technologist: Carlos Levering RVT  Examination Guidelines: A complete evaluation includes at minimum, Doppler waveform signals and systolic blood pressure reading at the level of bilateral brachial, anterior tibial, and posterior tibial arteries, when vessel segments are accessible. Bilateral testing is considered an integral part of a complete examination. Photoelectric Plethysmograph (PPG) waveforms and toe systolic pressure readings are included as required and additional duplex testing as needed. Limited examinations for reoccurring indications may be performed as noted.  ABI Findings:  +---------+------------------+-----+---------+--------+ Right    Rt Pressure (mmHg)IndexWaveform Comment  +---------+------------------+-----+---------+--------+ Brachial 135                    triphasic         +---------+------------------+-----+---------+--------+ PTA      122               0.90 biphasic          +---------+------------------+-----+---------+--------+ DP       116               0.86 biphasic          +---------+------------------+-----+---------+--------+ Great Toe47                0.35                   +---------+------------------+-----+---------+--------+ +---------+------------------+-----+---------+-------+ Left     Lt Pressure (mmHg)IndexWaveform Comment +---------+------------------+-----+---------+-------+ Brachial 130                    triphasic        +---------+------------------+-----+---------+-------+ PTA      162               1.20 biphasic         +---------+------------------+-----+---------+-------+  DP       165               1.22 triphasic        +---------+------------------+-----+---------+-------+ Great Toe59                0.44                  +---------+------------------+-----+---------+-------+ +-------+-----------+-----------+------------+------------+ ABI/TBIToday's ABIToday's TBIPrevious ABIPrevious TBI +-------+-----------+-----------+------------+------------+ Right  0.9        0.35                                +-------+-----------+-----------+------------+------------+ Left   1.22       0.44                                +-------+-----------+-----------+------------+------------+  Summary: Right: Resting right ankle-brachial index indicates mild right lower extremity arterial disease. The right toe-brachial index is abnormal. Left: Resting left ankle-brachial index is within normal range. No evidence of significant left lower extremity arterial disease. The left toe-brachial index  is abnormal.  *See table(s) above for measurements and observations.  Electronically signed by Servando Snare MD on 04/02/2021 at 4:52:40 PM.    Final    ECHOCARDIOGRAM COMPLETE  Result Date: 04/21/2021    ECHOCARDIOGRAM REPORT   Patient Name:   TANEASHA FUQUA Date of Exam: 04/21/2021 Medical Rec #:  [id number]          Height:       61.0 in Accession #:    [id number]         Weight:       117.0 lb Date of Birth:  February 23, 1951         BSA:          1.504 m Patient Age:    6 years           BP:           132/80 mmHg Patient Gender: F                  HR:           100 bpm. Exam Location:  Inpatient Procedure: 2D Echo Indications:    Chest pain. Abnormal ecg.  History:        Patient has no prior history of Echocardiogram examinations.                 Pleural effusion; Risk Factors:Hypertension.  Sonographer:    Johny Chess RDCS Referring Phys: [id number] [place]  1. Left ventricular ejection fraction, by estimation, is 45 to 50%. The left ventricle has low normal function. The left ventricle demonstrates regional wall motion abnormalities (see scoring diagram/findings for description). Left ventricular diastolic  parameters are consistent with Grade I diastolic dysfunction (impaired relaxation).  2. Right ventricular systolic function is normal. The right ventricular size is normal. Tricuspid regurgitation signal is inadequate for assessing PA pressure.  3. Large pleural effusion in the left lateral region.  4. The mitral valve is normal in structure. Trivial mitral valve regurgitation. No evidence of mitral stenosis.  5. The aortic valve is tricuspid. There is mild calcification of the aortic valve. There is mild thickening of the aortic valve. Aortic valve regurgitation is not visualized. No aortic stenosis is present.  6. The inferior vena cava is normal in size with  greater than 50% respiratory variability, suggesting right atrial pressure of 3 mmHg. FINDINGS  Left Ventricle: The  anteroseptal wall and apex appear hypokinetic. Left ventricular ejection fraction, by estimation, is 45 to 50%. The left ventricle has low normal function. The left ventricle demonstrates regional wall motion abnormalities. The left ventricular internal cavity size was normal in size. There is no left ventricular hypertrophy. Left ventricular diastolic parameters are consistent with Grade I diastolic dysfunction (impaired relaxation). Normal left ventricular filling pressure. Right Ventricle: The right ventricular size is normal. No increase in right ventricular wall thickness. Right ventricular systolic function is normal. Tricuspid regurgitation signal is inadequate for assessing PA pressure. Left Atrium: Left atrial size was normal in size. Right Atrium: Right atrial size was normal in size. Pericardium: There is no evidence of pericardial effusion. Mitral Valve: The mitral valve is normal in structure. Trivial mitral valve regurgitation. No evidence of mitral valve stenosis. Tricuspid Valve: The tricuspid valve is normal in structure. Tricuspid valve regurgitation is not demonstrated. No evidence of tricuspid stenosis. Aortic Valve: The aortic valve is tricuspid. There is mild calcification of the aortic valve. There is mild thickening of the aortic valve. There is mild aortic valve annular calcification. Aortic valve regurgitation is not visualized. No aortic stenosis  is present. Aortic valve mean gradient measures 2.5 mmHg. Aortic valve peak gradient measures 5.6 mmHg. Aortic valve area, by VTI measures 2.19 cm. Pulmonic Valve: The pulmonic valve was not well visualized. Pulmonic valve regurgitation is not visualized. No evidence of pulmonic stenosis. Aorta: The aortic root is normal in size and structure. Venous: The inferior vena cava is normal in size with greater than 50% respiratory variability, suggesting right atrial pressure of 3 mmHg. IAS/Shunts: No atrial level shunt detected by color flow Doppler.  Additional Comments: There is a large pleural effusion in the left lateral region.  LEFT VENTRICLE PLAX 2D LVIDd:         4.10 cm     Diastology LVIDs:         3.20 cm     LV e' medial:    6.20 cm/s LV PW:         0.90 cm     LV E/e' medial:  7.6 LV IVS:        0.80 cm     LV e' lateral:   5.98 cm/s LVOT diam:     2.00 cm     LV E/e' lateral: 7.9 LV SV:         46 LV SV Index:   30 LVOT Area:     3.14 cm  LV Volumes (MOD) LV vol d, MOD A2C: 50.8 ml LV vol d, MOD A4C: 68.8 ml LV vol s, MOD A2C: 28.6 ml LV vol s, MOD A4C: 45.8 ml LV SV MOD A2C:     22.2 ml LV SV MOD A4C:     68.8 ml LV SV MOD BP:      21.8 ml RIGHT VENTRICLE             IVC RV S prime:     14.70 cm/s  IVC diam: 1.00 cm TAPSE (M-mode): 1.6 cm LEFT ATRIUM             Index        RIGHT ATRIUM           Index LA diam:        2.40 cm 1.60 cm/m   RA Area:     12.10 cm  LA Vol (A2C):   23.4 ml 15.56 ml/m  RA Volume:   27.10 ml  18.02 ml/m LA Vol (A4C):   45.2 ml 30.06 ml/m LA Biplane Vol: 34.7 ml 23.07 ml/m  AORTIC VALVE AV Area (Vmax):    2.47 cm AV Area (Vmean):   2.70 cm AV Area (VTI):     2.19 cm AV Vmax:           118.62 cm/s AV Vmean:          70.876 cm/s AV VTI:            0.210 m AV Peak Grad:      5.6 mmHg AV Mean Grad:      2.5 mmHg LVOT Vmax:         93.40 cm/s LVOT Vmean:        60.900 cm/s LVOT VTI:          0.146 m LVOT/AV VTI ratio: 0.70  AORTA Ao Root diam: 2.70 cm Ao Asc diam:  2.90 cm MV E velocity: 47.40 cm/s MV A velocity: 94.70 cm/s  SHUNTS MV E/A ratio:  0.50        Systemic VTI:  0.15 m                            Systemic Diam: 2.00 cm Carlyle Dolly MD Electronically signed by Carlyle Dolly MD Signature Date/Time: 04/21/2021/11:55:23 AM    Final    [place] EKG SITE RITE  Result Date: 04/01/2021 If Site Rite image not attached, placement could not be confirmed due to current cardiac rhythm.   Assessment/Plan  1. Weakness of both lower extremities -   MRI of the lumbar spine showed non healed L3-L4 compression fracture,  severe canal stenosis of L4-L5 with severe right and moderate foraminal stenosis.  MRI of the thoracic spine showed multiple mild foraminal stenosis degenerative changes and at least moderate foraminal stenosis C6-C7 and C7-T1 with a moderate left greater than right pleural effusion -  Neurosurgery relates that her degenerative stenosis would not be causing any of her acute neurological symptoms and that she has a healing compression of the lumbar spine.  Lumbar spine fracture need to heal prior to surgery.  LCO brace and dexamethasone taper was ordered for radicular pain. -  follow up with neurology and neurosurgery -  for PT and OT, for therapeutic strengthening exercises  2. Essential hypertension, benign -   BPs stable, not on any anti-hypertensive medication -   monitor BPs  3. Urinary retention -  has foley catheter, follow up with urology  4. History of gastritis -   continue  Pantoprazole 40 mg daily  5. Protein-calorie malnutrition, severe Wt Readings from Last 3 Encounters:  04/26/21 117 lb (53.1 kg)  04/19/21 117 lb (53.1 kg)  03/31/21 128 lb 1.4 oz (58.1 kg)   -   continue Ensure supplementation  6. Hypokalemia Lab Results  Component Value Date   K 3.7 04/22/2021   -   continue KCL supplementation  7. Major depression, recurrent, chronic (HCC) -  mood is stable, continue  Sertraline  8. Itching -   denies itching Will discontinue Vistaril 50 mg TID and start Vistaril 25 mg BID PRN - hydrOXYzine (VISTARIL) 25 MG capsule; Take 1 capsule (25 mg total) by mouth 2 (two) times daily as needed.  Dispense: 30 capsule; Refill: 0  9. Left upper extremity edema -  patient stated that it swelled  up after discontinuation of peripheral IV - noted with 2+ radial pulse -   will ace wrap LUE and instructed to elevate     Family/ staff Communication:  Discussed plan of care with resident and charge nurse.  Labs/tests ordered:  for BMP and CBC on 05/03/21  Goals of care:    Short term care   Durenda Age, DNP, MSN, FNP-BC [facility] [phone] (Monday-Friday 8:00 a.m. - 5:00 p.m.) [phone] (after hours)

## 2021-04-28 LAB — CULTURE, BLOOD (ROUTINE X 2)
Culture: NO GROWTH
Culture: NO GROWTH
Special Requests: ADEQUATE

## 2021-04-30 ENCOUNTER — Non-Acute Institutional Stay (SKILLED_NURSING_FACILITY): Payer: Medicare HMO | Admitting: Adult Health

## 2021-04-30 ENCOUNTER — Encounter: Payer: Self-pay | Admitting: Adult Health

## 2021-04-30 DIAGNOSIS — Z8719 Personal history of other diseases of the digestive system: Secondary | ICD-10-CM | POA: Diagnosis not present

## 2021-04-30 DIAGNOSIS — R29898 Other symptoms and signs involving the musculoskeletal system: Secondary | ICD-10-CM

## 2021-04-30 DIAGNOSIS — R112 Nausea with vomiting, unspecified: Secondary | ICD-10-CM | POA: Diagnosis not present

## 2021-04-30 DIAGNOSIS — I10 Essential (primary) hypertension: Secondary | ICD-10-CM

## 2021-04-30 NOTE — Progress Notes (Signed)
Location:  [facility] Room Number: 676-P Place of Service:  SNF (31) Provider:  Durenda Age, DNP, FNP-BC  Patient Care Team: Glendale Chard, MD as PCP - General (Internal Medicine) Magrinat, Virgie Dad, MD as Consulting Physician (Oncology) Clarene Essex, MD as Consulting Physician (Gastroenterology)  Extended Emergency Contact Information Primary Emergency Contact: Clair Gulling Address: [address]          [place], [place] [address] Johnnette Litter of [place] Phone: [phone] Mobile Phone: [phone] Relation: Spouse  Code Status: Full Code   Goals of care: Advanced Directive information Advanced Directives 04/30/2021  Does Patient Have a Medical Advance Directive? No  Would patient like information on creating a medical advance directive? No - Patient declined     Chief Complaint  Patient presents with   Acute Visit    Nausea     HPI:  Pt is a 71 y.o. female for an acute visit. She was reported to be nauseated and vomited X 1. Left upper extremity has improved, now only with trace edema. Venous doppler ultrasound done on 04/26/21 was negative for DVT with 2+pulse on left wrist. SBPs ranging from 103 to 148. She is not on any medication for hypertension. She takes Pantoprazole 40 mg daily for history of gastritis. She is here for short-term rehabilitation and is currently having PT and OT due to weakness of bilateral lower extremities.  MRI of the lumbar spine showed nonhealed L3-L4 compression fracture, severe canal stenosis of L4-L5 with severe right and moderate foraminal stenosis.  MRI of the thoracic spine showed multiple mild foraminal stenosis degenerative changes and at least moderate foraminal stenosis C6-C7 and C7-T1 with a moderate left greater than right pleural effusion.  Neurosurgery recommended for the compression fracture of the lumbar spine to fully healed prior to surgery and recommended LCL brace and dexamethasone taper  for radicular pain.  Past Medical History:  Diagnosis Date   Fatty liver    GERD (gastroesophageal reflux disease)    Hypertension    Osteomyelitis ([facility])    right third toe   Peripheral vascular disease ([facility])    Ulcer 08/14/20   Past Surgical History:  Procedure Laterality Date   AMPUTATION Right 08/08/2017   Procedure: AMPUTATION RIGHT 3RD TOE;  Surgeon: Newt Minion, MD;  Location: [facility];  Service: Orthopedics;  Laterality: Right;   BIOPSY  08/14/2020   Procedure: BIOPSY;  Surgeon: Clarene Essex, MD;  Location: [facility];  Service: Endoscopy;;   Bunionectomy Right 2006     Right   Bunionectony Left 2006      August   COLONOSCOPY N/A 01/21/2014   Procedure: COLONOSCOPY;  Surgeon: Beryle Beams, MD;  Location: [facility];  Service: Endoscopy;  Laterality: N/A;   ESOPHAGOGASTRODUODENOSCOPY (EGD) WITH PROPOFOL N/A 08/14/2020   Procedure: ESOPHAGOGASTRODUODENOSCOPY (EGD) WITH PROPOFOL;  Surgeon: Clarene Essex, MD;  Location: [facility];  Service: Endoscopy;  Laterality: N/A;   TUBAL LIGATION     WISDOM TOOTH EXTRACTION      No Known Allergies  Outpatient Encounter Medications as of 04/30/2021  Medication Sig   acetaminophen (TYLENOL) 325 MG tablet Take 650 mg by mouth every 8 (eight) hours as needed.   bisacodyl (DULCOLAX) 10 MG suppository If not relieved by MOM, give 10 mg Bisacodyl suppositiory rectally X 1 dose in 24 hours as needed   Cholecalciferol (VITAMIN D3) 5000 units CAPS Take 5,000 Units by mouth daily.   feeding supplement (ENSURE ENLIVE / ENSURE PLUS) LIQD Take 237  mLs by mouth 3 (three) times daily between meals.   hydrOXYzine (VISTARIL) 25 MG capsule Take 1 capsule (25 mg total) by mouth 2 (two) times daily as needed.   Magnesium Hydroxide (MILK OF MAGNESIA PO) Take by mouth. Constipation (1 of 4): If no BM in 3 days, give 30 cc Milk of Magnesium p.o. x 1 dose in 24 hours as needed   Multiple Vitamin (MULTIVITAMIN WITH MINERALS) TABS tablet Take 1 tablet by mouth  daily.   NON FORMULARY DIET: NAS/HEART HEALTHY   nystatin (MYCOSTATIN) 100000 UNIT/ML suspension SWISH 5 ML BY MOUTH AND SPIT OUT FOUR TIMES DAILY X 2 WEEKS FOR ORAL YEAST   ondansetron (ZOFRAN ODT) 4 MG disintegrating tablet Take 1 tablet (4 mg total) by mouth every 8 (eight) hours as needed for nausea or vomiting.   pantoprazole (PROTONIX) 40 MG tablet Take 1 tablet (40 mg total) by mouth daily.   potassium chloride SA (KLOR-CON) 20 MEQ tablet Take 1 tablet (20 mEq total) by mouth 2 (two) times daily.   sertraline (ZOLOFT) 25 MG tablet Take 1 tablet (25 mg total) by mouth daily.   Sodium Phosphates (RA SALINE ENEMA RE) Place rectally. If not relieved by Biscodyl suppository, give disposable Saline Enema rectally X 1 dose/24 hrs as needed   Tetrahydrozoline HCl (VISINE OP) Place 2 drops into both eyes daily as needed (for dry/irritated eyes).    No facility-administered encounter medications on file as of 04/30/2021.    Review of Systems  GENERAL: No change in appetite, no fatigue, no weight changes, no fever or chills  MOUTH and THROAT: Denies oral discomfort, gingival pain or bleeding, pain from teeth or hoarseness   RESPIRATORY: no cough, SOB, DOE, wheezing, hemoptysis CARDIAC: No chest pain or palpitations GI: No abdominal pain, + nausea and vomiting GU: Denies dysuria, frequency, hematuria, incontinence, or discharge NEUROLOGICAL: Denies dizziness, syncope, numbness, or headache PSYCHIATRIC: Denies feelings of depression or anxiety. No report of hallucinations, insomnia, paranoia, or agitation  Immunization History  Administered Date(s) Administered   Influenza Split 07/11/2014   Influenza, High Dose Seasonal PF 03/23/2019   Influenza, Quadrivalent, Recombinant, Inj, Pf 03/30/2018   Influenza,inj,Quad PF,6+ Mos 03/23/2017   Influenza-Unspecified 03/30/2018   PFIZER(Purple Top)SARS-COV-2 Vaccination 09/16/2019, 10/07/2019   Pneumococcal Polysaccharide-23 03/23/2019   Zoster  Recombinat (Shingrix) 05/28/2019, 08/03/2019   Pertinent  Health Maintenance Due  Topic Date Due   INFLUENZA VACCINE  01/29/2021   MAMMOGRAM  12/14/2022   COLONOSCOPY (Pts 45-21yrs Insurance coverage will need to be confirmed)  01/22/2024   DEXA SCAN  Completed   Fall Risk 04/23/2021 04/23/2021 04/24/2021 04/24/2021 04/25/2021  Falls in the past year? - - - - -  Was there an injury with Fall? - - - - -  Fall Risk Category Calculator - - - - -  Fall Risk Category - - - - -  Patient Fall Risk Level High fall risk High fall risk High fall risk High fall risk High fall risk  Patient at Risk for Falls Due to - - - - -  Fall risk Follow up - - - - -     Vitals:   04/30/21 1600  BP: 121/86  Pulse: 92  Resp: 18  Temp: 97.7 F (36.5 C)  Weight: 144 lb 9.6 oz (65.6 kg)  Height: 5\' 1"  (1.549 m)   Body mass index is 27.32 kg/m.  Physical Exam  GENERAL APPEARANCE: Well nourished. In no acute distress. Obese SKIN:  Skin is warm and dry.  MOUTH and THROAT: Lips are without lesions. Oral mucosa is moist and without lesions.  RESPIRATORY: Breathing is even & unlabored, BS CTAB CARDIAC: RRR, no murmur,no extra heart sounds, LUE trace edema GI: Abdomen soft, normal BS, no masses, no tenderness, NEUROLOGICAL: There is no tremor. Speech is clear. Alert and oriented X 3. PSYCHIATRIC:  Affect and behavior are appropriate  Labs reviewed: Recent Labs    03/31/21 1817 04/01/21 1837 04/02/21 1110 04/04/21 0956 04/20/21 0111 04/20/21 0120 04/22/21 0349  NA  --  138   < > 139 138 139 136  K  --  3.0*   < > 4.7 3.1* 3.5 3.7  CL  --  104   < > 109 101 103 103  CO2  --  21*   < > 21*  --  25 27  GLUCOSE  --  96   < > 66* 76 79 78  BUN  --  10   < > 6* 9 10 9   CREATININE  --  0.89   < > 0.57 0.50 0.71 0.70  CALCIUM  --  7.7*   < > 7.9*  --  7.7* 7.5*  MG 1.6* 1.7  --   --   --   --  1.5*   < > = values in this interval not displayed.   Recent Labs    03/31/21 1349 04/01/21 1837  04/20/21 0120  AST 39 39 44*  ALT 47* 34 26  ALKPHOS 119 103 167*  BILITOT 1.1 0.8 1.3*  PROT 6.0* 5.1* 5.3*  ALBUMIN 2.6* 2.1* 1.8*   Recent Labs    02/13/21 1618 03/31/21 1338 04/01/21 0513 04/20/21 0100 04/20/21 0111  WBC 5.4 4.0 7.4 7.2  --   NEUTROABS 3.2 3.0  --  5.4  --   HGB 11.4* 13.6 15.9* 13.6 15.3*  HCT 32.3* 39.5 45.6 38.2 45.0  MCV 93.4 100.5* 99.3 95.3  --   PLT 192 169 164 260  --    Lab Results  Component Value Date   TSH 3.190 03/31/2021   Lab Results  Component Value Date   HGBA1C 4.5 (L) 02/01/2021   Lab Results  Component Value Date   CHOL 146 08/22/2020   HDL 53 08/22/2020   LDLCALC 77 08/22/2020   TRIG 83 08/22/2020   CHOLHDL 2.8 08/22/2020    Significant Diagnostic Results in last 30 days:  CT HEAD WO CONTRAST (5MM)  Result Date: 04/19/2021 CLINICAL DATA:  Neuro deficit, acute, stroke suspected EXAM: CT HEAD WITHOUT CONTRAST TECHNIQUE: Contiguous axial images were obtained from the base of the skull through the vertex without intravenous contrast. COMPARISON:  02/22/2021 FINDINGS: Brain: There is atrophy and chronic small vessel disease changes. No acute intracranial abnormality. Specifically, no hemorrhage, hydrocephalus, mass lesion, acute infarction, or significant intracranial injury. Vascular: No hyperdense vessel or unexpected calcification. Skull: No acute calvarial abnormality. Sinuses/Orbits: No acute findings Other: None IMPRESSION: Atrophy, chronic microvascular disease. No acute intracranial abnormality. Electronically Signed   By: Rolm Baptise M.D.   On: 04/19/2021 22:52   MR BRAIN WO CONTRAST  Result Date: 04/20/2021 CLINICAL DATA:  Neuro deficit, acute, stroke suspected EXAM: MRI HEAD WITHOUT CONTRAST TECHNIQUE: Multiplanar, multiecho pulse sequences of the brain and surrounding structures were obtained without intravenous contrast. COMPARISON:  CT head10/20/2022. FINDINGS: Brain: No acute infarction, hemorrhage, hydrocephalus,  extra-axial collection or mass lesion. Mild scattered T2 hyperintensities in the white matter, nonspecific but compatible with chronic microvascular disease. Mild atrophy. Vascular: Major arterial flow  voids are maintained at the skull base. Skull and upper cervical spine: Normal marrow signal. Sinuses/Orbits: Clear sinuses.  Unremarkable orbits Other: Small right and trace left mastoid effusions. IMPRESSION: 1. No evidence of acute intracranial abnormality. 2. Mild chronic microvascular disease and atrophy. Electronically Signed   By: Margaretha Sheffield M.D.   On: 04/20/2021 06:55   MR CERVICAL SPINE WO CONTRAST  Result Date: 04/22/2021 CLINICAL DATA:  Motor neuron disease. Frequent falls with weakness of left lower extremities. 70 y.o. female past medical history significant for fatty liver, GERD, essential hypertension, osteomyelitis of the right toe, peripheral vascular disease, history of upper GI bleed due to peptic ulcers who was recently discharged from the hospital for gastroenteritis, iron overload and maculopapular rash she is coming in today complaining of progressive weakness both lower extremities, falls and numbness. EXAM: MRI CERVICAL SPINE WITHOUT CONTRAST TECHNIQUE: Multiplanar, multisequence MR imaging of the cervical spine was performed. No intravenous contrast was administered. COMPARISON:  Limited correlation made with cranial and thoracic MRI 01/18/2021. FINDINGS: Alignment: Near anatomic. There is a slight anterolisthesis at C4-5 and C7-T1. Vertebrae: No acute or suspicious osseous findings. Cord: Normal in signal and caliber. Posterior Fossa, vertebral arteries, paraspinal tissues: Visualized portions of the posterior fossa appear unremarkable. No significant paraspinal findings. Bilateral vertebral artery flow voids. Disc levels: C2-3: Mild disc bulging with moderate bilateral facet hypertrophy. The central spinal canal is widely patent. Moderate right and mild left foraminal narrowing.  C3-4: Mild disc bulging with bilateral uncinate spurring. Moderate to severe bilateral facet hypertrophy, worse on the right. The CSF surrounding the cord is partially effaced without cord deformity. Moderate to severe right and mild left foraminal narrowing. C4-5: There is loss of disc height with annular disc bulging and uncinate spurring. Moderate to severe facet hypertrophy bilaterally. The CSF surrounding the cord is effaced without cord deformity. Moderate to severe foraminal narrowing bilaterally which could affect either C5 nerve root. C5-6: Spondylosis with loss of disc height and posterior osteophytes asymmetric the right. Milder facet degenerative changes at this level. No cord deformity. Moderate to severe right and mild left foraminal narrowing. C6-7: Spondylosis with loss of disc height and posterior osteophytes asymmetric to the right. The CSF surrounding the cord is partially effaced with mild right-sided cord flattening. Moderate to severe right and moderate left foraminal narrowing. C7-T1: Mild loss of disc height with disc bulging, uncinate spurring and bilateral facet hypertrophy. The CSF surrounding the cord is partially effaced without cord deformity. Moderate to severe foraminal narrowing bilaterally. The visualized upper thoracic spine appears unchanged from the recent thoracic MRI. IMPRESSION: 1. Multilevel cervical spondylosis with disc bulging, uncinate spurring and facet hypertrophy as described. 2. There is mild central spinal stenosis at multiple levels as detailed above. There is mild cord flattening on the right at C6-7. No abnormal cord signal. 3. Multilevel significant osseous foraminal narrowing which could affect the exiting nerve roots. 4. No acute findings demonstrated. Electronically Signed   By: Richardean Sale M.D.   On: 04/22/2021 10:52   MR THORACIC SPINE WO CONTRAST  Result Date: 04/20/2021 CLINICAL DATA:  Ataxia, nontraumatic, T-spine pathology suspected; Low back  pain, cauda equina syndrome suspected EXAM: MRI THORACIC AND LUMBAR SPINE WITHOUT CONTRAST TECHNIQUE: Multiplanar and multiecho pulse sequences of the thoracic and lumbar spine were obtained without intravenous contrast. COMPARISON:  CT lumbar spine 02/22/2021. FINDINGS: MRI THORACIC SPINE FINDINGS Alignment:  Slight anterolisthesis of T1 on T2 and T2 on T3. Vertebrae: Vertebral body heights are maintained.Multilevel degenerative/discogenic endplate  signal changes, greatest at T9-T10 where there is disc height loss. Otherwise, no focal marrow edema to suggest acute fracture or discitis/osteomyelitis. Heterogeneous bone marrow without suspicious bone lesion. Cord:  Normal cord signal. Paraspinal and other soft tissues: Moderate left greater than right pleural effusions. Disc levels: Disc bulges and ligamentum flavum thickening at multiple levels throughout the imaged lower cervical and thoracic spine. Although partially imaged on sagittal imaging, probably mild to moderate canal stenosis and potentially at least moderate foraminal stenosis at C6-C7 and C7-T1. Mild canal stenosis at T5-T6, T7-T8, and T10-T11. Multilevel facet arthropathy. Foraminal stenosis is greatest and moderate on the right at T10-T11. Otherwise, multilevel mild foraminal stenosis in the thoracic spine. MRI LUMBAR SPINE FINDINGS Segmentation:  Standard Alignment: Similar grade 1 anterolisthesis of L2 on L3, L3 on L4, and L4 on L5. Vertebrae: Redemonstrated L3 and L4 compression fractures with similar height loss (35% L3 and 50% at L4). Edema within the superior endplates at these levels suggests that the fractures are unhealed. Edema within the L4 posterior elements. No substantial retropulsion. Conus medullaris and cauda equina: Conus extends to the T12-L1 level. Conus appears normal. Paraspinal and other soft tissues: Paraspinal muscular atrophy. Disc levels: T12-L1: No significant disc protrusion, foraminal stenosis, or canal stenosis. L1-L2:  No significant disc protrusion, foraminal stenosis, or canal stenosis. L2-L3: Grade 1 anterolisthesis. Left eccentric disc bulge, bulky ligamentum flavum thickening and mild facet arthropathy. Prominent dorsal epidural fat. Resulting moderate canal stenosis without significant foraminal stenosis. L3-L4: Grade 1 anterolisthesis. Left eccentric disc bulge with bulky ligamentum flavum thickening and moderate bilateral facet arthropathy. Prominent dorsal epidural fat. Resulting moderate to severe canal stenosis without significant foraminal stenosis. L4-L5: Grade 1 anterolisthesis. Uncovering the disc with uncovering the disc and superimposed broad disc bulge. Severe bilateral facet arthropathy with bulky ligamentum flavum thickening. Resulting severe canal stenosis. Severe right and moderate left foraminal stenosis. L5-S1: Mild broad disc bulge and endplate spurring with mild-to-moderate bilateral facet arthropathy. Resulting mild bilateral foraminal stenosis and mild bilateral subarticular recess stenosis without significant canal stenosis. IMPRESSION: MR LUMBAR SPINE IMPRESSION 1. Unhealed L3 and L4 compression fractures with similar height loss and marrow edema along the superior endplates. Edema within the L4 posterior elements could be posttraumatic or degenerative in etiology. 2. At L4-L5, severe canal stenosis with severe right and moderate left foraminal stenosis. 3. Moderate to severe canal stenosis at L3-L4 and moderate canal stenosis at L2-L3. MR THORACIC SPINE IMPRESSION 1. Moderate foraminal stenosis on the right at T10-T11. Otherwise, multilevel mild foraminal stenosis in the thoracic spine. 2. Multilevel degenerative disc disease and mild canal stenosis in the thoracic spine. 3. Potentially mild-to-moderate canal stenosis and at least moderate foraminal stenosis C6-C7 and C7-T1, partially only imaged on sagittal and incompletely assessed. Dedicated MRI of the cervical spine could further characterize if  clinically indicated. 4. Moderate left greater than right pleural effusions. Consider dedicated chest imaging. Electronically Signed   By: Margaretha Sheffield M.D.   On: 04/20/2021 07:46   MR LUMBAR SPINE WO CONTRAST  Result Date: 04/20/2021 CLINICAL DATA:  Ataxia, nontraumatic, T-spine pathology suspected; Low back pain, cauda equina syndrome suspected EXAM: MRI THORACIC AND LUMBAR SPINE WITHOUT CONTRAST TECHNIQUE: Multiplanar and multiecho pulse sequences of the thoracic and lumbar spine were obtained without intravenous contrast. COMPARISON:  CT lumbar spine 02/22/2021. FINDINGS: MRI THORACIC SPINE FINDINGS Alignment:  Slight anterolisthesis of T1 on T2 and T2 on T3. Vertebrae: Vertebral body heights are maintained.Multilevel degenerative/discogenic endplate signal changes, greatest at T9-T10 where there  is disc height loss. Otherwise, no focal marrow edema to suggest acute fracture or discitis/osteomyelitis. Heterogeneous bone marrow without suspicious bone lesion. Cord:  Normal cord signal. Paraspinal and other soft tissues: Moderate left greater than right pleural effusions. Disc levels: Disc bulges and ligamentum flavum thickening at multiple levels throughout the imaged lower cervical and thoracic spine. Although partially imaged on sagittal imaging, probably mild to moderate canal stenosis and potentially at least moderate foraminal stenosis at C6-C7 and C7-T1. Mild canal stenosis at T5-T6, T7-T8, and T10-T11. Multilevel facet arthropathy. Foraminal stenosis is greatest and moderate on the right at T10-T11. Otherwise, multilevel mild foraminal stenosis in the thoracic spine. MRI LUMBAR SPINE FINDINGS Segmentation:  Standard Alignment: Similar grade 1 anterolisthesis of L2 on L3, L3 on L4, and L4 on L5. Vertebrae: Redemonstrated L3 and L4 compression fractures with similar height loss (35% L3 and 50% at L4). Edema within the superior endplates at these levels suggests that the fractures are unhealed.  Edema within the L4 posterior elements. No substantial retropulsion. Conus medullaris and cauda equina: Conus extends to the T12-L1 level. Conus appears normal. Paraspinal and other soft tissues: Paraspinal muscular atrophy. Disc levels: T12-L1: No significant disc protrusion, foraminal stenosis, or canal stenosis. L1-L2: No significant disc protrusion, foraminal stenosis, or canal stenosis. L2-L3: Grade 1 anterolisthesis. Left eccentric disc bulge, bulky ligamentum flavum thickening and mild facet arthropathy. Prominent dorsal epidural fat. Resulting moderate canal stenosis without significant foraminal stenosis. L3-L4: Grade 1 anterolisthesis. Left eccentric disc bulge with bulky ligamentum flavum thickening and moderate bilateral facet arthropathy. Prominent dorsal epidural fat. Resulting moderate to severe canal stenosis without significant foraminal stenosis. L4-L5: Grade 1 anterolisthesis. Uncovering the disc with uncovering the disc and superimposed broad disc bulge. Severe bilateral facet arthropathy with bulky ligamentum flavum thickening. Resulting severe canal stenosis. Severe right and moderate left foraminal stenosis. L5-S1: Mild broad disc bulge and endplate spurring with mild-to-moderate bilateral facet arthropathy. Resulting mild bilateral foraminal stenosis and mild bilateral subarticular recess stenosis without significant canal stenosis. IMPRESSION: MR LUMBAR SPINE IMPRESSION 1. Unhealed L3 and L4 compression fractures with similar height loss and marrow edema along the superior endplates. Edema within the L4 posterior elements could be posttraumatic or degenerative in etiology. 2. At L4-L5, severe canal stenosis with severe right and moderate left foraminal stenosis. 3. Moderate to severe canal stenosis at L3-L4 and moderate canal stenosis at L2-L3. MR THORACIC SPINE IMPRESSION 1. Moderate foraminal stenosis on the right at T10-T11. Otherwise, multilevel mild foraminal stenosis in the thoracic  spine. 2. Multilevel degenerative disc disease and mild canal stenosis in the thoracic spine. 3. Potentially mild-to-moderate canal stenosis and at least moderate foraminal stenosis C6-C7 and C7-T1, partially only imaged on sagittal and incompletely assessed. Dedicated MRI of the cervical spine could further characterize if clinically indicated. 4. Moderate left greater than right pleural effusions. Consider dedicated chest imaging. Electronically Signed   By: Margaretha Sheffield M.D.   On: 04/20/2021 07:46   VAS [place] ABI WITH/WO TBI  Result Date: 04/02/2021  LOWER EXTREMITY DOPPLER STUDY Patient Name:  Maybelle Depaoli  Date of Exam:   04/02/2021 Medical Rec #: [id number]           Accession #:    [id number] Date of Birth: 01-18-1951          Patient Gender: F Patient Age:   54 years Exam Location:  [facility] Procedure:      VAS [place] ABI WITH/WO TBI Referring Phys: RALPH NETTEY --------------------------------------------------------------------------------  Indications: Peripheral  artery disease. High Risk Factors: Hypertension.  Comparison Study: No prior studies. Performing Technologist: Carlos Levering RVT  Examination Guidelines: A complete evaluation includes at minimum, Doppler waveform signals and systolic blood pressure reading at the level of bilateral brachial, anterior tibial, and posterior tibial arteries, when vessel segments are accessible. Bilateral testing is considered an integral part of a complete examination. Photoelectric Plethysmograph (PPG) waveforms and toe systolic pressure readings are included as required and additional duplex testing as needed. Limited examinations for reoccurring indications may be performed as noted.  ABI Findings: +---------+------------------+-----+---------+--------+ Right    Rt Pressure (mmHg)IndexWaveform Comment  +---------+------------------+-----+---------+--------+ Brachial 135                    triphasic          +---------+------------------+-----+---------+--------+ PTA      122               0.90 biphasic          +---------+------------------+-----+---------+--------+ DP       116               0.86 biphasic          +---------+------------------+-----+---------+--------+ Great Toe47                0.35                   +---------+------------------+-----+---------+--------+ +---------+------------------+-----+---------+-------+ Left     Lt Pressure (mmHg)IndexWaveform Comment +---------+------------------+-----+---------+-------+ Brachial 130                    triphasic        +---------+------------------+-----+---------+-------+ PTA      162               1.20 biphasic         +---------+------------------+-----+---------+-------+ DP       165               1.22 triphasic        +---------+------------------+-----+---------+-------+ [place]                0.44                  +---------+------------------+-----+---------+-------+ +-------+-----------+-----------+------------+------------+ ABI/TBIToday's ABIToday's TBIPrevious ABIPrevious TBI +-------+-----------+-----------+------------+------------+ Right  0.9        0.35                                +-------+-----------+-----------+------------+------------+ Left   1.22       0.44                                +-------+-----------+-----------+------------+------------+  Summary: Right: Resting right ankle-brachial index indicates mild right lower extremity arterial disease. The right toe-brachial index is abnormal. Left: Resting left ankle-brachial index is within normal range. No evidence of significant left lower extremity arterial disease. The left toe-brachial index is abnormal.  *See table(s) above for measurements and observations.  Electronically signed by Servando Snare MD on 04/02/2021 at 4:52:40 PM.    Final    ECHOCARDIOGRAM COMPLETE  Result Date: 04/21/2021    ECHOCARDIOGRAM  REPORT   Patient Name:   SHADIYAH WERNLI Date of Exam: 04/21/2021 Medical Rec #:  [id number]          Height:       61.0 in Accession #:    [id number]  Weight:       117.0 lb Date of Birth:  1950/08/07         BSA:          1.504 m Patient Age:    82 years           BP:           132/80 mmHg Patient Gender: F                  HR:           100 bpm. Exam Location:  Inpatient Procedure: 2D Echo Indications:    Chest pain. Abnormal ecg.  History:        Patient has no prior history of Echocardiogram examinations.                 Pleural effusion; Risk Factors:Hypertension.  Sonographer:    Johny Chess RDCS Referring Phys: [id number] [place]  1. Left ventricular ejection fraction, by estimation, is 45 to 50%. The left ventricle has low normal function. The left ventricle demonstrates regional wall motion abnormalities (see scoring diagram/findings for description). Left ventricular diastolic  parameters are consistent with Grade I diastolic dysfunction (impaired relaxation).  2. Right ventricular systolic function is normal. The right ventricular size is normal. Tricuspid regurgitation signal is inadequate for assessing PA pressure.  3. Large pleural effusion in the left lateral region.  4. The mitral valve is normal in structure. Trivial mitral valve regurgitation. No evidence of mitral stenosis.  5. The aortic valve is tricuspid. There is mild calcification of the aortic valve. There is mild thickening of the aortic valve. Aortic valve regurgitation is not visualized. No aortic stenosis is present.  6. The inferior vena cava is normal in size with greater than 50% respiratory variability, suggesting right atrial pressure of 3 mmHg. FINDINGS  Left Ventricle: The anteroseptal wall and apex appear hypokinetic. Left ventricular ejection fraction, by estimation, is 45 to 50%. The left ventricle has low normal function. The left ventricle demonstrates regional wall motion abnormalities.  The left ventricular internal cavity size was normal in size. There is no left ventricular hypertrophy. Left ventricular diastolic parameters are consistent with Grade I diastolic dysfunction (impaired relaxation). Normal left ventricular filling pressure. Right Ventricle: The right ventricular size is normal. No increase in right ventricular wall thickness. Right ventricular systolic function is normal. Tricuspid regurgitation signal is inadequate for assessing PA pressure. Left Atrium: Left atrial size was normal in size. Right Atrium: Right atrial size was normal in size. Pericardium: There is no evidence of pericardial effusion. Mitral Valve: The mitral valve is normal in structure. Trivial mitral valve regurgitation. No evidence of mitral valve stenosis. Tricuspid Valve: The tricuspid valve is normal in structure. Tricuspid valve regurgitation is not demonstrated. No evidence of tricuspid stenosis. Aortic Valve: The aortic valve is tricuspid. There is mild calcification of the aortic valve. There is mild thickening of the aortic valve. There is mild aortic valve annular calcification. Aortic valve regurgitation is not visualized. No aortic stenosis  is present. Aortic valve mean gradient measures 2.5 mmHg. Aortic valve peak gradient measures 5.6 mmHg. Aortic valve area, by VTI measures 2.19 cm. Pulmonic Valve: The pulmonic valve was not well visualized. Pulmonic valve regurgitation is not visualized. No evidence of pulmonic stenosis. Aorta: The aortic root is normal in size and structure. Venous: The inferior vena cava is normal in size with greater than 50% respiratory variability, suggesting right atrial pressure of 3 mmHg.  IAS/Shunts: No atrial level shunt detected by color flow Doppler. Additional Comments: There is a large pleural effusion in the left lateral region.  LEFT VENTRICLE PLAX 2D LVIDd:         4.10 cm     Diastology LVIDs:         3.20 cm     LV e' medial:    6.20 cm/s LV PW:         0.90 cm      LV E/e' medial:  7.6 LV IVS:        0.80 cm     LV e' lateral:   5.98 cm/s LVOT diam:     2.00 cm     LV E/e' lateral: 7.9 LV SV:         46 LV SV Index:   30 LVOT Area:     3.14 cm  LV Volumes (MOD) LV vol d, MOD A2C: 50.8 ml LV vol d, MOD A4C: 68.8 ml LV vol s, MOD A2C: 28.6 ml LV vol s, MOD A4C: 45.8 ml LV SV MOD A2C:     22.2 ml LV SV MOD A4C:     68.8 ml LV SV MOD BP:      21.8 ml RIGHT VENTRICLE             IVC RV S prime:     14.70 cm/s  IVC diam: 1.00 cm TAPSE (M-mode): 1.6 cm LEFT ATRIUM             Index        RIGHT ATRIUM           Index LA diam:        2.40 cm 1.60 cm/m   RA Area:     12.10 cm LA Vol (A2C):   23.4 ml 15.56 ml/m  RA Volume:   27.10 ml  18.02 ml/m LA Vol (A4C):   45.2 ml 30.06 ml/m LA Biplane Vol: 34.7 ml 23.07 ml/m  AORTIC VALVE AV Area (Vmax):    2.47 cm AV Area (Vmean):   2.70 cm AV Area (VTI):     2.19 cm AV Vmax:           118.62 cm/s AV Vmean:          70.876 cm/s AV VTI:            0.210 m AV Peak Grad:      5.6 mmHg AV Mean Grad:      2.5 mmHg LVOT Vmax:         93.40 cm/s LVOT Vmean:        60.900 cm/s LVOT VTI:          0.146 m LVOT/AV VTI ratio: 0.70  AORTA Ao Root diam: 2.70 cm Ao Asc diam:  2.90 cm MV E velocity: 47.40 cm/s MV A velocity: 94.70 cm/s  SHUNTS MV E/A ratio:  0.50        Systemic VTI:  0.15 m                            Systemic Diam: 2.00 cm Carlyle Dolly MD Electronically signed by Carlyle Dolly MD Signature Date/Time: 04/21/2021/11:55:23 AM    Final    [place] EKG SITE RITE  Result Date: 04/01/2021 If Site Rite image not attached, placement could not be confirmed due to current cardiac rhythm.   Assessment/Plan  1. Nausea and vomiting, unspecified vomiting type -  will start Zofran 4 mg 1 8 hours PRN for nausea   2. History of gastritis -   Continue pantoprazole 40 mg daily  3. Essential hypertension, benign  -   BPs stable, not on antihypertensive medication  4. Weakness of both lower extremities -    Continue PT and OT, for  therapeutic strengthening exercises -   Completed dexamethasone taper -   stated that her lower back pain is 1/10   Family/ staff Communication: Discussed plan of care with resident and charge nurse.  Labs/tests ordered: None  Goals of care:   Short-term care   Durenda Age, DNP, MSN, FNP-BC [facility] [phone] (Monday-Friday 8:00 a.m. - 5:00 p.m.) [phone] (after hours)

## 2021-05-02 ENCOUNTER — Encounter: Payer: Self-pay | Admitting: Adult Health

## 2021-05-02 ENCOUNTER — Non-Acute Institutional Stay (SKILLED_NURSING_FACILITY): Payer: Medicare HMO | Admitting: Adult Health

## 2021-05-02 ENCOUNTER — Emergency Department (HOSPITAL_COMMUNITY): Payer: Medicare HMO

## 2021-05-02 ENCOUNTER — Inpatient Hospital Stay (HOSPITAL_COMMUNITY)
Admission: EM | Admit: 2021-05-02 | Discharge: 2021-05-22 | DRG: 286 | Disposition: A | Discharge status: Home Health | Payer: Medicare HMO | Source: Skilled Nursing Facility | Attending: Internal Medicine | Admitting: Internal Medicine

## 2021-05-02 ENCOUNTER — Other Ambulatory Visit: Payer: Self-pay

## 2021-05-02 DIAGNOSIS — D6489 Other specified anemias: Secondary | ICD-10-CM | POA: Diagnosis present

## 2021-05-02 DIAGNOSIS — J90 Pleural effusion, not elsewhere classified: Secondary | ICD-10-CM | POA: Diagnosis not present

## 2021-05-02 DIAGNOSIS — J9811 Atelectasis: Secondary | ICD-10-CM | POA: Diagnosis not present

## 2021-05-02 DIAGNOSIS — M4306 Spondylolysis, lumbar region: Secondary | ICD-10-CM | POA: Diagnosis not present

## 2021-05-02 DIAGNOSIS — E876 Hypokalemia: Secondary | ICD-10-CM | POA: Diagnosis present

## 2021-05-02 DIAGNOSIS — I429 Cardiomyopathy, unspecified: Secondary | ICD-10-CM

## 2021-05-02 DIAGNOSIS — I517 Cardiomegaly: Secondary | ICD-10-CM | POA: Diagnosis not present

## 2021-05-02 DIAGNOSIS — F32A Depression, unspecified: Secondary | ICD-10-CM | POA: Diagnosis present

## 2021-05-02 DIAGNOSIS — Z807 Family history of other malignant neoplasms of lymphoid, hematopoietic and related tissues: Secondary | ICD-10-CM

## 2021-05-02 DIAGNOSIS — E43 Unspecified severe protein-calorie malnutrition: Secondary | ICD-10-CM | POA: Diagnosis not present

## 2021-05-02 DIAGNOSIS — M5090 Cervical disc disorder, unspecified, unspecified cervical region: Secondary | ICD-10-CM | POA: Diagnosis not present

## 2021-05-02 DIAGNOSIS — E871 Hypo-osmolality and hyponatremia: Secondary | ICD-10-CM | POA: Diagnosis not present

## 2021-05-02 DIAGNOSIS — R188 Other ascites: Secondary | ICD-10-CM

## 2021-05-02 DIAGNOSIS — I2510 Atherosclerotic heart disease of native coronary artery without angina pectoris: Secondary | ICD-10-CM | POA: Diagnosis not present

## 2021-05-02 DIAGNOSIS — Z48813 Encounter for surgical aftercare following surgery on the respiratory system: Secondary | ICD-10-CM | POA: Diagnosis not present

## 2021-05-02 DIAGNOSIS — G8929 Other chronic pain: Secondary | ICD-10-CM

## 2021-05-02 DIAGNOSIS — I82422 Acute embolism and thrombosis of left iliac vein: Secondary | ICD-10-CM | POA: Diagnosis present

## 2021-05-02 DIAGNOSIS — E8809 Other disorders of plasma-protein metabolism, not elsewhere classified: Secondary | ICD-10-CM | POA: Diagnosis not present

## 2021-05-02 DIAGNOSIS — I10 Essential (primary) hypertension: Secondary | ICD-10-CM

## 2021-05-02 DIAGNOSIS — Z9889 Other specified postprocedural states: Secondary | ICD-10-CM

## 2021-05-02 DIAGNOSIS — Z8249 Family history of ischemic heart disease and other diseases of the circulatory system: Secondary | ICD-10-CM

## 2021-05-02 DIAGNOSIS — R339 Retention of urine, unspecified: Secondary | ICD-10-CM | POA: Diagnosis present

## 2021-05-02 DIAGNOSIS — R9431 Abnormal electrocardiogram [ECG] [EKG]: Secondary | ICD-10-CM | POA: Diagnosis present

## 2021-05-02 DIAGNOSIS — Z800 Family history of malignant neoplasm of digestive organs: Secondary | ICD-10-CM

## 2021-05-02 DIAGNOSIS — E878 Other disorders of electrolyte and fluid balance, not elsewhere classified: Secondary | ICD-10-CM | POA: Diagnosis not present

## 2021-05-02 DIAGNOSIS — N280 Ischemia and infarction of kidney: Secondary | ICD-10-CM | POA: Diagnosis present

## 2021-05-02 DIAGNOSIS — R918 Other nonspecific abnormal finding of lung field: Secondary | ICD-10-CM | POA: Diagnosis not present

## 2021-05-02 DIAGNOSIS — I824Y2 Acute embolism and thrombosis of unspecified deep veins of left proximal lower extremity: Secondary | ICD-10-CM | POA: Diagnosis not present

## 2021-05-02 DIAGNOSIS — K219 Gastro-esophageal reflux disease without esophagitis: Secondary | ICD-10-CM | POA: Diagnosis present

## 2021-05-02 DIAGNOSIS — I82411 Acute embolism and thrombosis of right femoral vein: Secondary | ICD-10-CM | POA: Diagnosis present

## 2021-05-02 DIAGNOSIS — I4721 Torsades de pointes: Secondary | ICD-10-CM | POA: Diagnosis not present

## 2021-05-02 DIAGNOSIS — I4901 Ventricular fibrillation: Secondary | ICD-10-CM | POA: Diagnosis not present

## 2021-05-02 DIAGNOSIS — R633 Feeding difficulties, unspecified: Secondary | ICD-10-CM | POA: Diagnosis not present

## 2021-05-02 DIAGNOSIS — I469 Cardiac arrest, cause unspecified: Secondary | ICD-10-CM | POA: Diagnosis not present

## 2021-05-02 DIAGNOSIS — J9601 Acute respiratory failure with hypoxia: Secondary | ICD-10-CM | POA: Diagnosis present

## 2021-05-02 DIAGNOSIS — R0600 Dyspnea, unspecified: Secondary | ICD-10-CM | POA: Diagnosis present

## 2021-05-02 DIAGNOSIS — M48061 Spinal stenosis, lumbar region without neurogenic claudication: Secondary | ICD-10-CM | POA: Diagnosis present

## 2021-05-02 DIAGNOSIS — Z79899 Other long term (current) drug therapy: Secondary | ICD-10-CM

## 2021-05-02 DIAGNOSIS — Z6821 Body mass index (BMI) 21.0-21.9, adult: Secondary | ICD-10-CM

## 2021-05-02 DIAGNOSIS — J811 Chronic pulmonary edema: Secondary | ICD-10-CM | POA: Diagnosis not present

## 2021-05-02 DIAGNOSIS — E46 Unspecified protein-calorie malnutrition: Secondary | ICD-10-CM | POA: Diagnosis not present

## 2021-05-02 DIAGNOSIS — R0789 Other chest pain: Secondary | ICD-10-CM | POA: Diagnosis not present

## 2021-05-02 DIAGNOSIS — R338 Other retention of urine: Secondary | ICD-10-CM | POA: Diagnosis present

## 2021-05-02 DIAGNOSIS — R601 Generalized edema: Secondary | ICD-10-CM | POA: Diagnosis present

## 2021-05-02 DIAGNOSIS — Z7901 Long term (current) use of anticoagulants: Secondary | ICD-10-CM

## 2021-05-02 DIAGNOSIS — R609 Edema, unspecified: Secondary | ICD-10-CM | POA: Diagnosis not present

## 2021-05-02 DIAGNOSIS — I5082 Biventricular heart failure: Secondary | ICD-10-CM | POA: Diagnosis present

## 2021-05-02 DIAGNOSIS — K760 Fatty (change of) liver, not elsewhere classified: Secondary | ICD-10-CM | POA: Diagnosis present

## 2021-05-02 DIAGNOSIS — M545 Low back pain, unspecified: Secondary | ICD-10-CM | POA: Diagnosis not present

## 2021-05-02 DIAGNOSIS — I110 Hypertensive heart disease with heart failure: Secondary | ICD-10-CM | POA: Diagnosis present

## 2021-05-02 DIAGNOSIS — J939 Pneumothorax, unspecified: Secondary | ICD-10-CM

## 2021-05-02 DIAGNOSIS — R0602 Shortness of breath: Secondary | ICD-10-CM

## 2021-05-02 DIAGNOSIS — I472 Ventricular tachycardia, unspecified: Secondary | ICD-10-CM | POA: Diagnosis not present

## 2021-05-02 DIAGNOSIS — Z20822 Contact with and (suspected) exposure to covid-19: Secondary | ICD-10-CM | POA: Diagnosis present

## 2021-05-02 DIAGNOSIS — R079 Chest pain, unspecified: Secondary | ICD-10-CM

## 2021-05-02 DIAGNOSIS — I5021 Acute systolic (congestive) heart failure: Secondary | ICD-10-CM | POA: Diagnosis not present

## 2021-05-02 DIAGNOSIS — I5043 Acute on chronic combined systolic (congestive) and diastolic (congestive) heart failure: Secondary | ICD-10-CM | POA: Diagnosis present

## 2021-05-02 DIAGNOSIS — R627 Adult failure to thrive: Secondary | ICD-10-CM | POA: Diagnosis present

## 2021-05-02 DIAGNOSIS — I420 Dilated cardiomyopathy: Secondary | ICD-10-CM | POA: Diagnosis not present

## 2021-05-02 DIAGNOSIS — Z803 Family history of malignant neoplasm of breast: Secondary | ICD-10-CM

## 2021-05-02 DIAGNOSIS — I4729 Other ventricular tachycardia: Secondary | ICD-10-CM | POA: Diagnosis not present

## 2021-05-02 DIAGNOSIS — Z4659 Encounter for fitting and adjustment of other gastrointestinal appliance and device: Secondary | ICD-10-CM

## 2021-05-02 DIAGNOSIS — R5381 Other malaise: Secondary | ICD-10-CM | POA: Diagnosis present

## 2021-05-02 DIAGNOSIS — D500 Iron deficiency anemia secondary to blood loss (chronic): Secondary | ICD-10-CM | POA: Diagnosis not present

## 2021-05-02 DIAGNOSIS — R197 Diarrhea, unspecified: Secondary | ICD-10-CM

## 2021-05-02 DIAGNOSIS — J9600 Acute respiratory failure, unspecified whether with hypoxia or hypercapnia: Secondary | ICD-10-CM | POA: Diagnosis not present

## 2021-05-02 DIAGNOSIS — R112 Nausea with vomiting, unspecified: Secondary | ICD-10-CM | POA: Diagnosis not present

## 2021-05-02 DIAGNOSIS — I4581 Long QT syndrome: Secondary | ICD-10-CM | POA: Diagnosis not present

## 2021-05-02 DIAGNOSIS — R000 Tachycardia, unspecified: Secondary | ICD-10-CM

## 2021-05-02 DIAGNOSIS — R1084 Generalized abdominal pain: Secondary | ICD-10-CM | POA: Diagnosis not present

## 2021-05-02 DIAGNOSIS — I462 Cardiac arrest due to underlying cardiac condition: Secondary | ICD-10-CM | POA: Diagnosis not present

## 2021-05-02 DIAGNOSIS — M4304 Spondylolysis, thoracic region: Secondary | ICD-10-CM | POA: Diagnosis not present

## 2021-05-02 DIAGNOSIS — R634 Abnormal weight loss: Secondary | ICD-10-CM | POA: Diagnosis not present

## 2021-05-02 DIAGNOSIS — Z7401 Bed confinement status: Secondary | ICD-10-CM

## 2021-05-02 DIAGNOSIS — M7989 Other specified soft tissue disorders: Secondary | ICD-10-CM | POA: Diagnosis not present

## 2021-05-02 DIAGNOSIS — R109 Unspecified abdominal pain: Secondary | ICD-10-CM

## 2021-05-02 DIAGNOSIS — R091 Pleurisy: Secondary | ICD-10-CM | POA: Diagnosis not present

## 2021-05-02 DIAGNOSIS — I509 Heart failure, unspecified: Secondary | ICD-10-CM | POA: Diagnosis not present

## 2021-05-02 DIAGNOSIS — K5900 Constipation, unspecified: Secondary | ICD-10-CM | POA: Diagnosis not present

## 2021-05-02 DIAGNOSIS — M6281 Muscle weakness (generalized): Secondary | ICD-10-CM | POA: Diagnosis not present

## 2021-05-02 DIAGNOSIS — J918 Pleural effusion in other conditions classified elsewhere: Secondary | ICD-10-CM | POA: Diagnosis present

## 2021-05-02 DIAGNOSIS — I428 Other cardiomyopathies: Secondary | ICD-10-CM

## 2021-05-02 DIAGNOSIS — T502X5A Adverse effect of carbonic-anhydrase inhibitors, benzothiadiazides and other diuretics, initial encounter: Secondary | ICD-10-CM | POA: Diagnosis present

## 2021-05-02 DIAGNOSIS — I82409 Acute embolism and thrombosis of unspecified deep veins of unspecified lower extremity: Secondary | ICD-10-CM | POA: Diagnosis present

## 2021-05-02 DIAGNOSIS — R29898 Other symptoms and signs involving the musculoskeletal system: Secondary | ICD-10-CM

## 2021-05-02 DIAGNOSIS — K921 Melena: Secondary | ICD-10-CM | POA: Diagnosis not present

## 2021-05-02 LAB — COMPREHENSIVE METABOLIC PANEL
ALT: 17 U/L (ref 0–44)
AST: 25 U/L (ref 15–41)
Albumin: 1.5 g/dL — ABNORMAL LOW (ref 3.5–5.0)
Alkaline Phosphatase: 128 U/L — ABNORMAL HIGH (ref 38–126)
Anion gap: 8 (ref 5–15)
BUN: 13 mg/dL (ref 8–23)
CO2: 21 mmol/L — ABNORMAL LOW (ref 22–32)
Calcium: 7.9 mg/dL — ABNORMAL LOW (ref 8.9–10.3)
Chloride: 103 mmol/L (ref 98–111)
Creatinine, Ser: 0.65 mg/dL (ref 0.44–1.00)
GFR, Estimated: >60 mL/min (ref >60)
Glucose, Bld: 90 mg/dL (ref 70–99)
Potassium: 4 mmol/L (ref 3.5–5.1)
Sodium: 132 mmol/L — ABNORMAL LOW (ref 135–145)
Total Bilirubin: 0.6 mg/dL (ref 0.3–1.2)
Total Protein: 4.4 g/dL — ABNORMAL LOW (ref 6.5–8.1)

## 2021-05-02 LAB — CBC WITH DIFFERENTIAL/PLATELET
Abs Immature Granulocytes: 0.09 10*3/uL — ABNORMAL HIGH (ref 0.00–0.07)
Basophils Absolute: 0.1 10*3/uL (ref 0.0–0.1)
Basophils Relative: 1 %
Eosinophils Absolute: 0.5 10*3/uL (ref 0.0–0.5)
Eosinophils Relative: 6 %
HCT: 32.2 % — ABNORMAL LOW (ref 36.0–46.0)
Hemoglobin: 11.8 g/dL — ABNORMAL LOW (ref 12.0–15.0)
Immature Granulocytes: 1 %
Lymphocytes Relative: 10 %
Lymphs Abs: 1 10*3/uL (ref 0.7–4.0)
MCH: 34.1 pg — ABNORMAL HIGH (ref 26.0–34.0)
MCHC: 36.6 g/dL — ABNORMAL HIGH (ref 30.0–36.0)
MCV: 93.1 fL (ref 80.0–100.0)
Monocytes Absolute: 0.9 10*3/uL (ref 0.1–1.0)
Monocytes Relative: 9 %
Neutro Abs: 7.2 10*3/uL (ref 1.7–7.7)
Neutrophils Relative %: 73 %
Platelets: 317 10*3/uL (ref 150–400)
RBC: 3.46 MIL/uL — ABNORMAL LOW (ref 3.87–5.11)
RDW: 16.3 % — ABNORMAL HIGH (ref 11.5–15.5)
WBC: 9.8 10*3/uL (ref 4.0–10.5)
nRBC: 0 % (ref 0.0–0.2)

## 2021-05-02 LAB — URINALYSIS, ROUTINE W REFLEX MICROSCOPIC
Bilirubin Urine: NEGATIVE
Glucose, UA: NEGATIVE mg/dL
Ketones, ur: NEGATIVE mg/dL
Nitrite: NEGATIVE
Protein, ur: 100 mg/dL — AB
RBC / HPF: >50 RBC/hpf — ABNORMAL HIGH (ref 0–5)
Specific Gravity, Urine: 1.026 (ref 1.005–1.030)
WBC, UA: >50 WBC/hpf — ABNORMAL HIGH (ref 0–5)
pH: 6 (ref 5.0–8.0)

## 2021-05-02 LAB — TROPONIN I (HIGH SENSITIVITY): Troponin I (High Sensitivity): 28 ng/L — ABNORMAL HIGH (ref <18)

## 2021-05-02 LAB — LIPASE, BLOOD: Lipase: 21 U/L (ref 11–51)

## 2021-05-02 IMAGING — CR DG CHEST 2V
2 series · 2 of 2 positions shown · non-contrast
Comparison: [DATE]

CLINICAL DATA: Shortness of breath

EXAM:
CHEST - 2 VIEW

[chest lat]
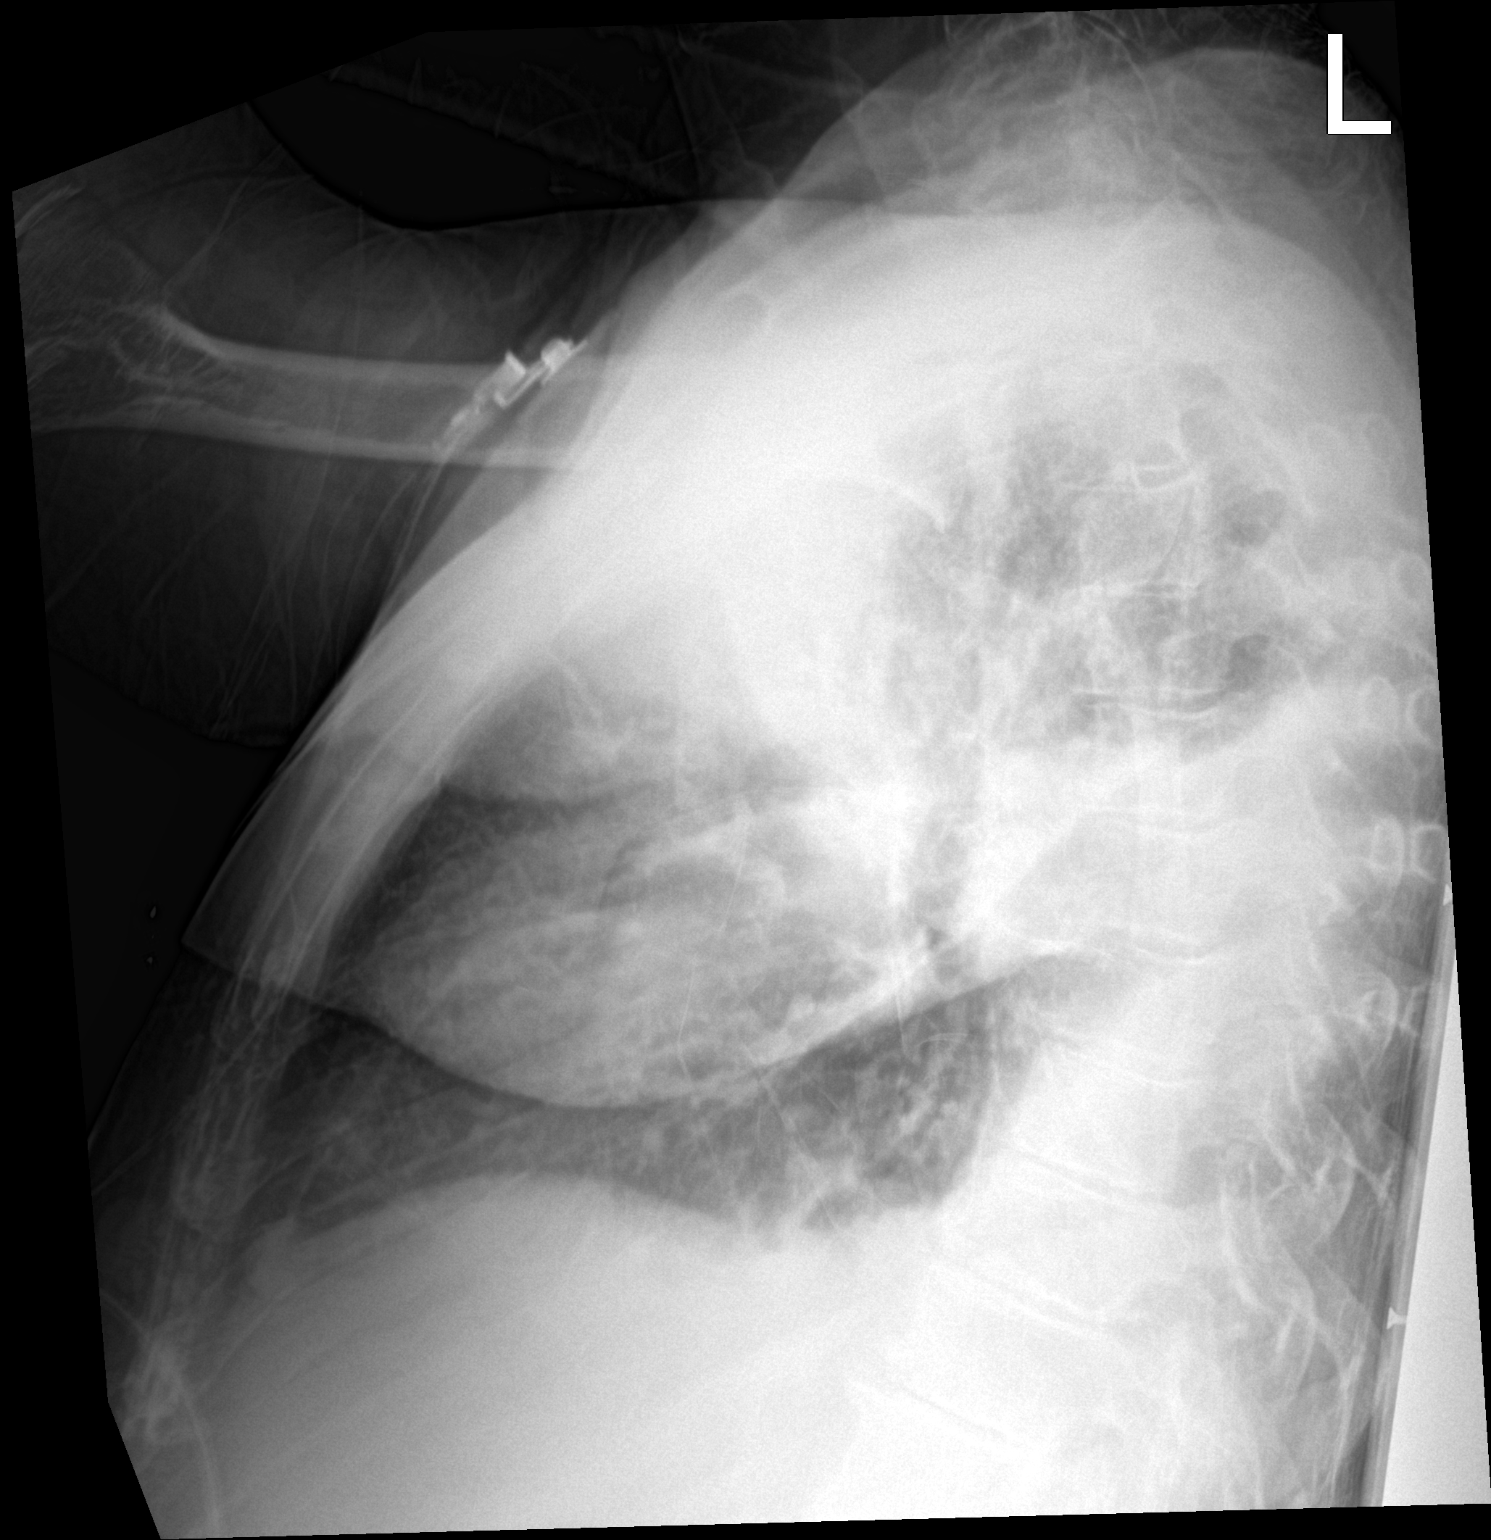

[chest ap]
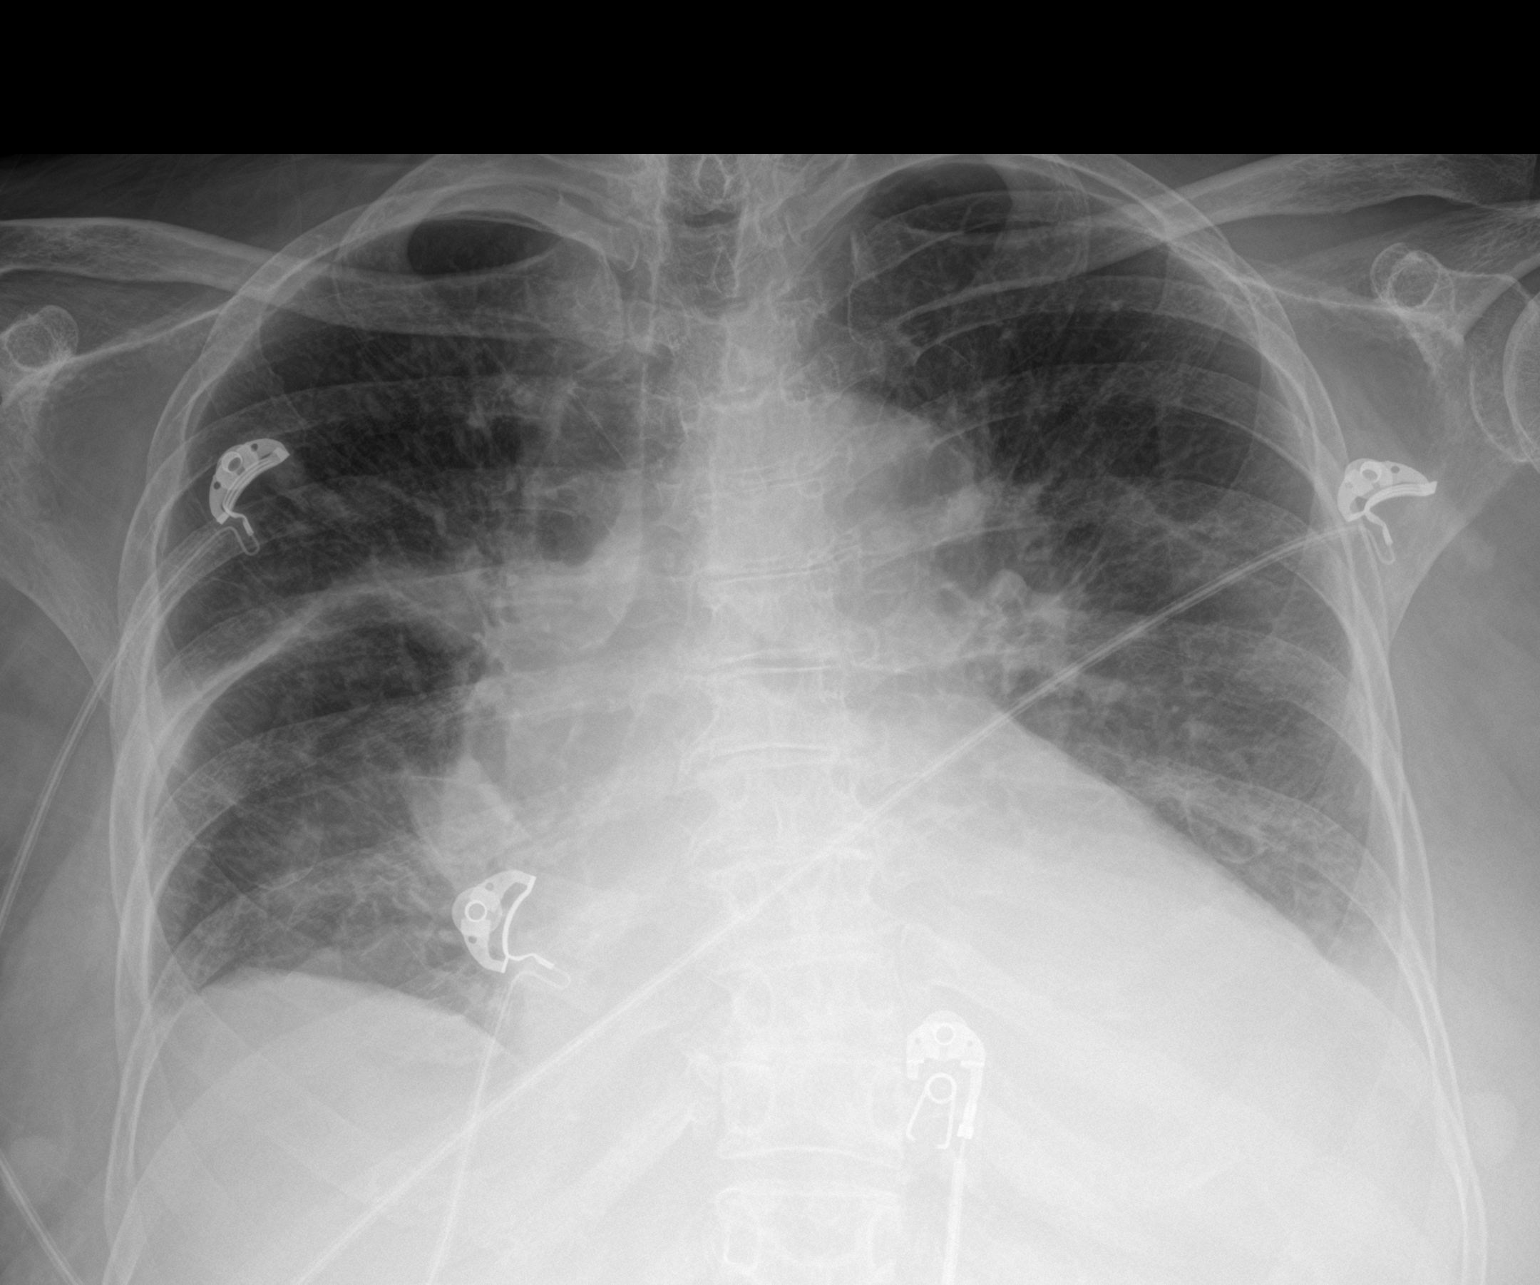

[2 of 2 positions shown; findings below may reference images not displayed]

FINDINGS: Bandlike opacity in the right mid lung, likely atelectasis. Moderate
retrocardiac left basilar opacity. Mild cardiomegaly.
IMPRESSION: Right mid lung atelectasis. Left basilar opacities may indicate
atelectasis or infection.

## 2021-05-02 IMAGING — CT CT ABD-PELV W/ CM
2 of 7 series · 14 of 46 positions shown, 16 images · IV contrast (Omni 300)
Comparison: None.

CLINICAL DATA: Chest pain, right lower quadrant abdominal pain,
urinary tract infection

EXAM:
CT ANGIOGRAPHY CHEST
CT ABDOMEN AND PELVIS WITH CONTRAST
TECHNIQUE: Multidetector CT imaging of the chest was performed using the
standard protocol during bolus administration of intravenous
contrast. Multiplanar CT image reconstructions and MIPs were
obtained to evaluate the vascular anatomy. Multidetector CT imaging
of the abdomen and pelvis was performed using the standard protocol
during bolus administration of intravenous contrast.
CONTRAST:  100mL OMNIPAQUE IOHEXOL 300 MG/ML  SOLN

[Series 6: a/p w/ 5mm · axial · 0.93mm/px · z∈[+972,+1272]mm · 11 of 72 slices shown, 13 images]
[im 6/72  soft-tissue]
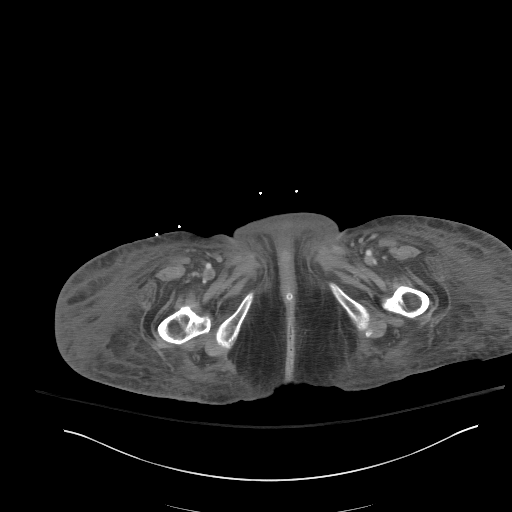
[im 6/72  bone]
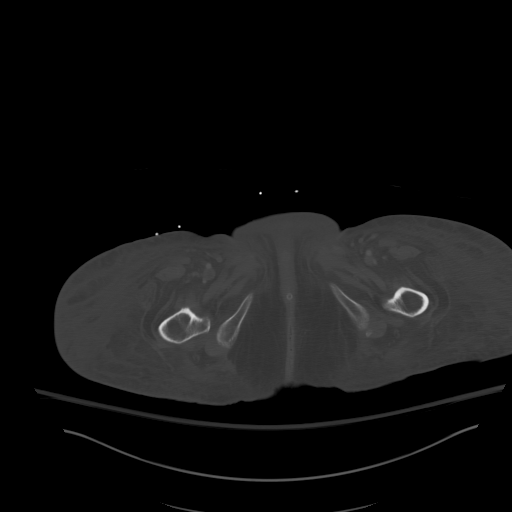
[im 11/72  soft-tissue]
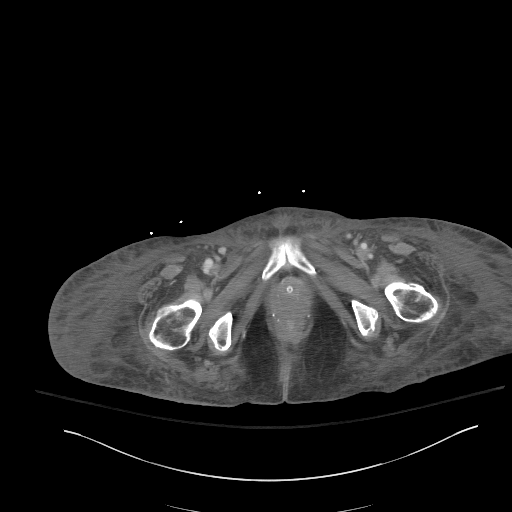
[im 16/72  soft-tissue]
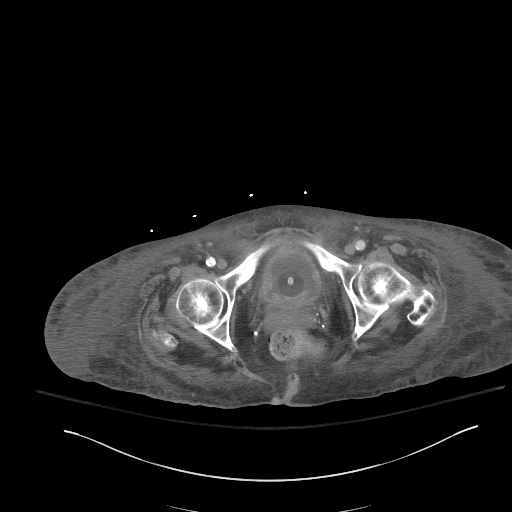
[im 26/72  soft-tissue]
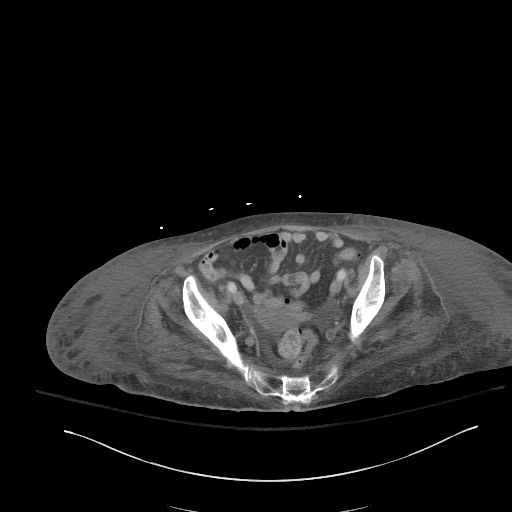
[im 31/72  soft-tissue]
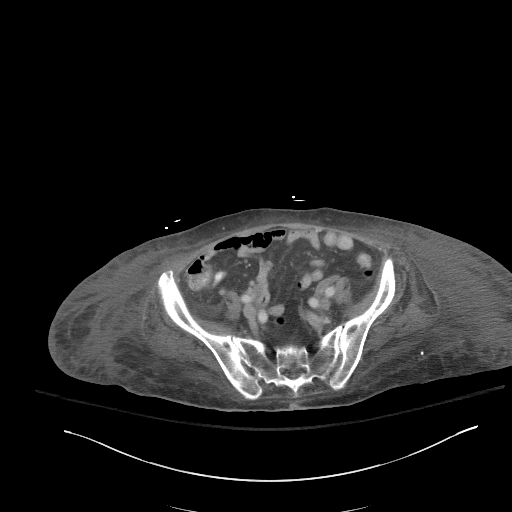
[im 36/72  soft-tissue]
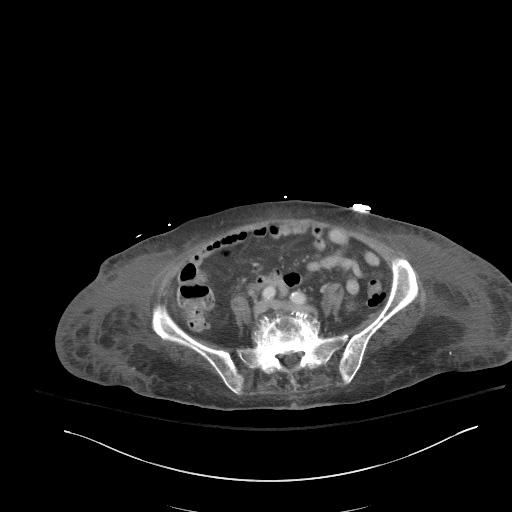
[im 41/72  soft-tissue]
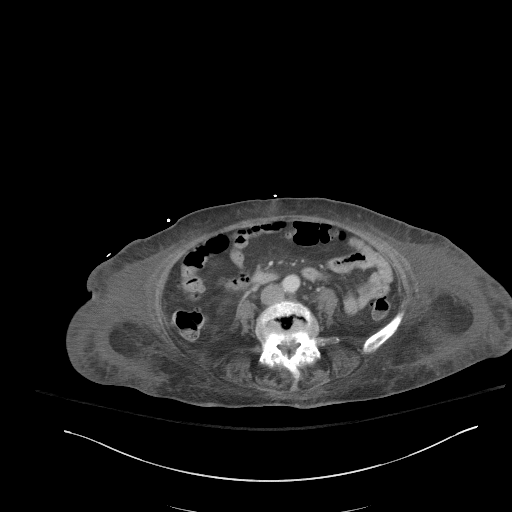
[im 46/72  soft-tissue]
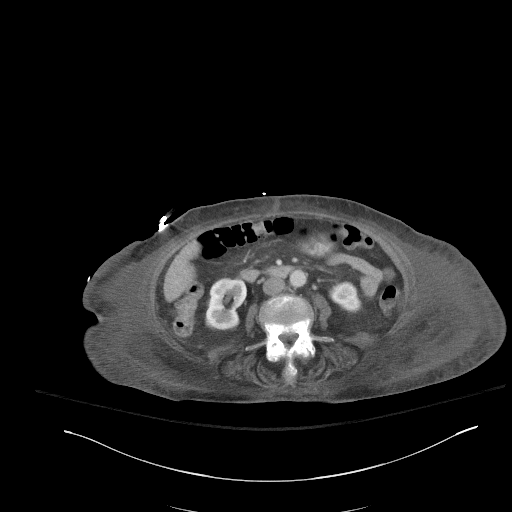
[im 56/72  soft-tissue]
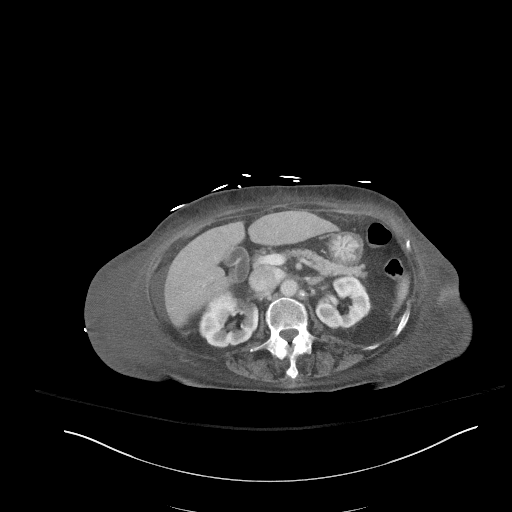
[im 56/72  bone]
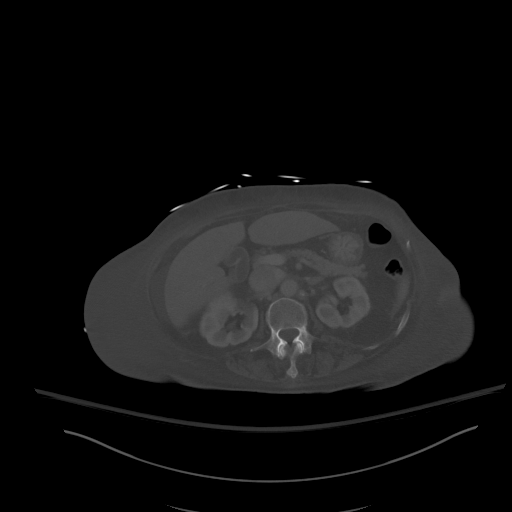
[im 61/72  soft-tissue]
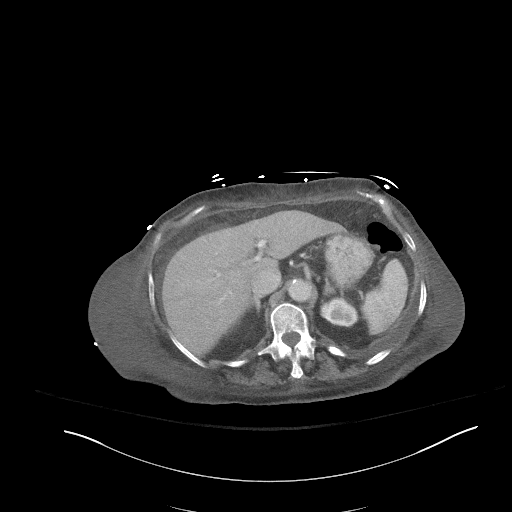
[im 66/72  soft-tissue]
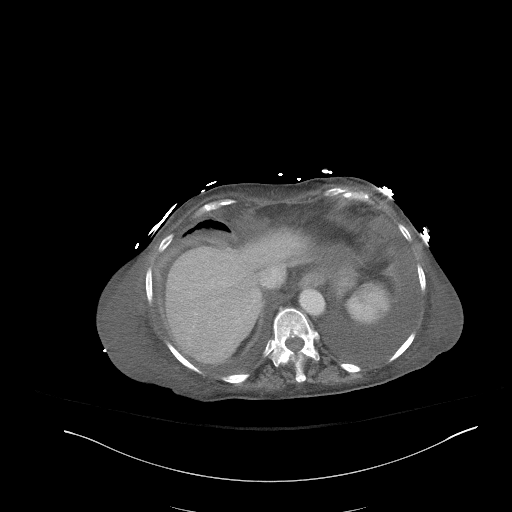

[Series 9: a/p w/ cor · coronal · 0.70mm/px · 3 of 108 slices shown]
[im 36/108  soft-tissue]
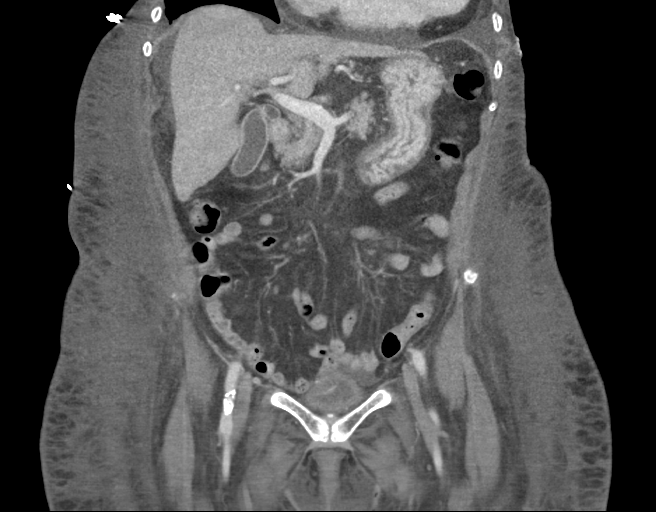
[im 48/108  soft-tissue]
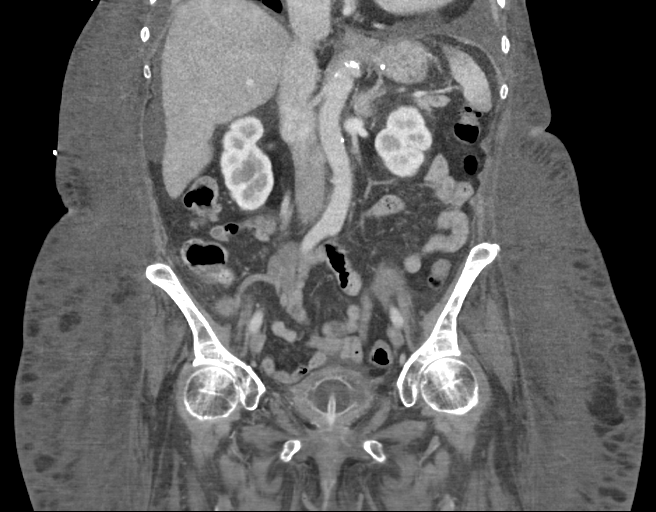
[im 60/108  soft-tissue]
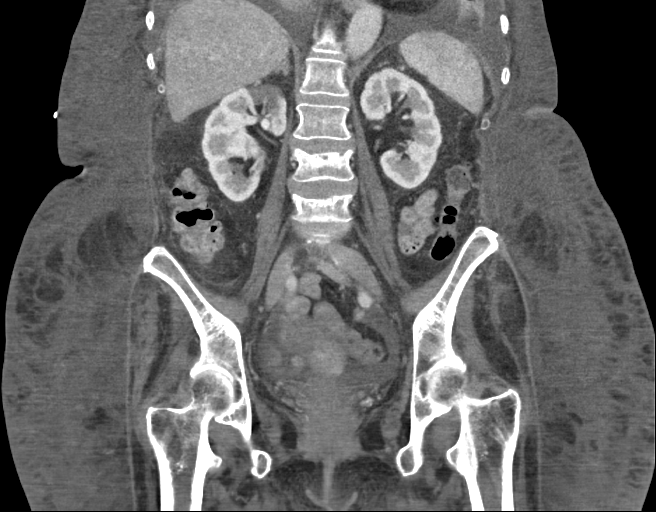

[14 of 46 positions shown; findings below may reference images not displayed]

FINDINGS: CTA CHEST FINDINGS

Cardiovascular: Adequate opacification of the pulmonary arterial
tree. No intraluminal filling defect identified to suggest acute
pulmonary embolism. Central pulmonary arteries are of normal
caliber.

Mild coronary artery calcification. Cardiac size is mildly enlarged.
No pericardial effusion. Mild atherosclerotic calcification within
the thoracic aorta. No aortic aneurysm.

Mediastinum/Nodes: No enlarged mediastinal, hilar, or axillary lymph
nodes. Thyroid gland, trachea, and esophagus demonstrate no
significant findings.

Lungs/Pleura: Moderate to large bilateral pleural effusions are
present with compressive atelectasis of the lungs bilaterally with
subtotal collapse of the left lower lobe and right middle lobe. No
superimposed confluent pulmonary infiltrate. No pneumothorax.
Central airways are patent.

Musculoskeletal: Osseous structures are age-appropriate. No acute
bone abnormality. No lytic or blastic bone lesion.

Review of the MIP images confirms the above findings.

CT ABDOMEN and PELVIS FINDINGS

Hepatobiliary: No focal liver abnormality is seen. No gallstones,
gallbladder wall thickening, or biliary dilatation.

Pancreas: Unremarkable

Spleen: Unremarkable

Adrenals/Urinary Tract: The adrenal glands are unremarkable. There
is a perfusion defect involving the upper pole of the right kidney
in keeping with an acute renal infarct. The kidneys are otherwise
unremarkable. Foley catheter balloon seen within a decompressed
bladder lumen.

Stomach/Bowel: Mild scattered colonic diverticulosis. Stomach, small
bowel, and large bowel are otherwise unremarkable. Appendix normal.
No free intraperitoneal gas. Mild ascites.

Vascular/Lymphatic: Mild aortoiliac atherosclerotic calcification.
No aortic aneurysm. A a filling defect is seen within the left
internal iliac vein, axial image # 46/6, compatible with acute DVT.
The abdominal vasculature is otherwise unremarkable. No pathologic
adenopathy within the abdomen and pelvis.

Reproductive: Uterus and bilateral adnexa are unremarkable.

Other: There is marked diffuse body wall subcutaneous edema. No
abdominal wall hernia.

Musculoskeletal: . No lytic degenerative changes are seen within the
lumbar spine. Age-indeterminate compression deformities of L3 and L4
are identified suspected chronic, however, given the lack of
surrounding paravertebral edema or infiltration or blastic bone
lesion.

Review of the MIP images confirms the above findings.
IMPRESSION: No pulmonary embolism.

Mild global cardiomegaly.

Large bilateral pleural effusions, mild ascites, and marked diffuse
body wall subcutaneous edema in keeping with anasarca, possibly
related to cardiogenic failure.

Acute right renal infarct. Infarct comprises less than 10% of the
cortex of the right kidney.

DVT within the left common iliac vein. Given the presence of a
systemic visceral infarct, echocardiographic correlation for an
intracardiac shunt and potential for paradoxical embolization may be
helpful.

Mild colonic diverticulosis.

Aortic Atherosclerosis ([XG]-[XG]).

## 2021-05-02 MED ORDER — HYDROCODONE-ACETAMINOPHEN 5-325 MG PO TABS: 1.0000 | ORAL_TABLET | Freq: Two times a day (BID) | ORAL | 0 refills | Status: DC | PRN

## 2021-05-02 NOTE — Progress Notes (Addendum)
Location:  [facility] Room Number: 867-Y Place of Service:  SNF (31) Provider:  Durenda Age, DNP, FNP-BC  Patient Care Team: Glendale Chard, MD as PCP - General (Internal Medicine) Magrinat, Virgie Dad, MD as Consulting Physician (Oncology) Clarene Essex, MD as Consulting Physician (Gastroenterology)  Extended Emergency Contact Information Primary Emergency Contact: Clair Gulling Address: [address]          [place], [place] [address] Johnnette Litter of [place] Phone: [phone] Mobile Phone: [phone] Relation: Spouse  Code Status:  Full Code  Goals of care: Advanced Directive information Advanced Directives 04/30/2021  Does Patient Have a Medical Advance Directive? No  Would patient like information on creating a medical advance directive? No - Patient declined     Chief Complaint  Patient presents with   Acute Visit    Chest pain    HPI:  Pt is a 70 y.o. female seen today for chest pains. Earlier, she requested to be discharged home to which the husband agreed. She now complains of chest pain and shortness of breath. O2 sat on room air was noted to be 96%, HR 136, BP 140/90, RR 18. LUE edema is now trace, improved. She was given 2 doses of nitroglycerin before she was relieved of chest pains. She was given Metoprolol tartrate 25 mg PO X 1. She is currently having PT and OT. She has medical history significant for prolonged QTC/multifocal tachycardia. She complains of lower back pains, 7/10. Recent MRI of lumbar spine showed nonhealed L3-L4 compression fracture, severe canal stenosis of L4-L5 with severe right and moderate foraminal stenosis. She is currently on Acetaminophen PRN and stated that it does not relieve her pain.   Past Medical History:  Diagnosis Date   Fatty liver    GERD (gastroesophageal reflux disease)    Hypertension    Osteomyelitis ([place])    right third toe   Peripheral vascular disease ([place])    Ulcer 08/14/20    Past Surgical History:  Procedure Laterality Date   AMPUTATION Right 08/08/2017   Procedure: AMPUTATION RIGHT 3RD TOE;  Surgeon: Newt Minion, MD;  Location: [facility];  Service: Orthopedics;  Laterality: Right;   BIOPSY  08/14/2020   Procedure: BIOPSY;  Surgeon: Clarene Essex, MD;  Location: [facility];  Service: Endoscopy;;   Bunionectomy Right 2006     Right   Bunionectony Left 2006      August   COLONOSCOPY N/A 01/21/2014   Procedure: COLONOSCOPY;  Surgeon: Beryle Beams, MD;  Location: [facility];  Service: Endoscopy;  Laterality: N/A;   ESOPHAGOGASTRODUODENOSCOPY (EGD) WITH PROPOFOL N/A 08/14/2020   Procedure: ESOPHAGOGASTRODUODENOSCOPY (EGD) WITH PROPOFOL;  Surgeon: Clarene Essex, MD;  Location: [facility];  Service: Endoscopy;  Laterality: N/A;   TUBAL LIGATION     WISDOM TOOTH EXTRACTION      No Known Allergies  Outpatient Encounter Medications as of 05/02/2021  Medication Sig   acetaminophen (TYLENOL) 325 MG tablet Take 650 mg by mouth every 8 (eight) hours as needed.   bisacodyl (DULCOLAX) 10 MG suppository If not relieved by MOM, give 10 mg Bisacodyl suppositiory rectally X 1 dose in 24 hours as needed   Cholecalciferol (VITAMIN D3) 5000 units CAPS Take 5,000 Units by mouth daily.   feeding supplement (ENSURE ENLIVE / ENSURE PLUS) LIQD Take 237 mLs by mouth 3 (three) times daily between meals.   hydrOXYzine (VISTARIL) 25 MG capsule Take 1 capsule (25 mg total) by mouth 2 (two) times daily as needed.  Magnesium Hydroxide (MILK OF MAGNESIA PO) Take by mouth. Constipation (1 of 4): If no BM in 3 days, give 30 cc Milk of Magnesium p.o. x 1 dose in 24 hours as needed   Multiple Vitamin (MULTIVITAMIN WITH MINERALS) TABS tablet Take 1 tablet by mouth daily.   NON FORMULARY DIET: NAS/HEART HEALTHY   nystatin (MYCOSTATIN) 100000 UNIT/ML suspension SWISH 5 ML BY MOUTH AND SPIT OUT FOUR TIMES DAILY X 2 WEEKS FOR ORAL YEAST   ondansetron (ZOFRAN ODT) 4 MG disintegrating tablet Take 1  tablet (4 mg total) by mouth every 8 (eight) hours as needed for nausea or vomiting.   pantoprazole (PROTONIX) 40 MG tablet Take 1 tablet (40 mg total) by mouth daily.   potassium chloride SA (KLOR-CON) 20 MEQ tablet Take 1 tablet (20 mEq total) by mouth 2 (two) times daily.   sertraline (ZOLOFT) 25 MG tablet Take 1 tablet (25 mg total) by mouth daily.   Sodium Phosphates (RA SALINE ENEMA RE) Place rectally. If not relieved by Biscodyl suppository, give disposable Saline Enema rectally X 1 dose/24 hrs as needed   Tetrahydrozoline HCl (VISINE OP) Place 2 drops into both eyes daily as needed (for dry/irritated eyes).    No facility-administered encounter medications on file as of 05/02/2021.    Review of Systems  GENERAL: No change in appetite, no fatigue, no weight changes, no fever or chills  MOUTH and THROAT: Denies oral discomfort, gingival pain or bleeding RESPIRATORY: no cough, SOB, DOE, wheezing, hemoptysis CARDIAC: +chest pain and palpitations GI: No abdominal pain, diarrhea, constipation, heart burn, nausea or vomiting GU: Denies dysuria, frequency, hematuria or discharge NEUROLOGICAL: Denies dizziness, syncope, numbness, or headache PSYCHIATRIC: Denies feelings of depression or anxiety. No report of hallucinations, insomnia, paranoia, or agitation   Immunization History  Administered Date(s) Administered   Influenza Split 07/11/2014   Influenza, High Dose Seasonal PF 03/23/2019   Influenza, Quadrivalent, Recombinant, Inj, Pf 03/30/2018   Influenza,inj,Quad PF,6+ Mos 03/23/2017   Influenza-Unspecified 03/30/2018   PFIZER(Purple Top)SARS-COV-2 Vaccination 09/16/2019, 10/07/2019   Pneumococcal Polysaccharide-23 03/23/2019   Zoster Recombinat (Shingrix) 05/28/2019, 08/03/2019   Pertinent  Health Maintenance Due  Topic Date Due   INFLUENZA VACCINE  01/29/2021   MAMMOGRAM  12/14/2022   COLONOSCOPY (Pts 45-94yrs Insurance coverage will need to be confirmed)  01/22/2024   DEXA  SCAN  Completed   Fall Risk 04/23/2021 04/23/2021 04/24/2021 04/24/2021 04/25/2021  Falls in the past year? - - - - -  Was there an injury with Fall? - - - - -  Fall Risk Category Calculator - - - - -  Fall Risk Category - - - - -  Patient Fall Risk Level High fall risk High fall risk High fall risk High fall risk High fall risk  Patient at Risk for Falls Due to - - - - -  Fall risk Follow up - - - - -     Vitals:   05/02/21 1151  BP: 124/84  Pulse: (!) 118  Resp: 18  Temp: 97.7 F (36.5 C)  Weight: 144 lb 12.8 oz (65.7 kg)  Height: 5\' 1"  (1.549 m)   Body mass index is 27.36 kg/m.  Physical Exam  GENERAL APPEARANCE: Well nourished. In no acute distress. Obese. SKIN:  Skin is warm and dry.  MOUTH and THROAT: Lips are without lesions. Oral mucosa is moist and without lesions.  RESPIRATORY: Breathing is even & unlabored, BS CTAB CARDIAC: tachycardic, no murmur,no extra heart sounds, LUE trace edema GI: Abdomen  soft, normal BS, no masses, no tenderness, NEUROLOGICAL: There is no tremor. Speech is clear. Alert and oriented X 3. PSYCHIATRIC:  Affect and behavior are appropriate  Labs reviewed: Recent Labs    03/31/21 1817 04/01/21 1837 04/02/21 1110 04/04/21 0956 04/20/21 0111 04/20/21 0120 04/22/21 0349  NA  --  138   < > 139 138 139 136  K  --  3.0*   < > 4.7 3.1* 3.5 3.7  CL  --  104   < > 109 101 103 103  CO2  --  21*   < > 21*  --  25 27  GLUCOSE  --  96   < > 66* 76 79 78  BUN  --  10   < > 6* 9 10 9   CREATININE  --  0.89   < > 0.57 0.50 0.71 0.70  CALCIUM  --  7.7*   < > 7.9*  --  7.7* 7.5*  MG 1.6* 1.7  --   --   --   --  1.5*   < > = values in this interval not displayed.   Recent Labs    03/31/21 1349 04/01/21 1837 04/20/21 0120  AST 39 39 44*  ALT 47* 34 26  ALKPHOS 119 103 167*  BILITOT 1.1 0.8 1.3*  PROT 6.0* 5.1* 5.3*  ALBUMIN 2.6* 2.1* 1.8*   Recent Labs    02/13/21 1618 03/31/21 1338 04/01/21 0513 04/20/21 0100 04/20/21 0111  WBC  5.4 4.0 7.4 7.2  --   NEUTROABS 3.2 3.0  --  5.4  --   HGB 11.4* 13.6 15.9* 13.6 15.3*  HCT 32.3* 39.5 45.6 38.2 45.0  MCV 93.4 100.5* 99.3 95.3  --   PLT 192 169 164 260  --    Lab Results  Component Value Date   TSH 3.190 03/31/2021   Lab Results  Component Value Date   HGBA1C 4.5 (L) 02/01/2021   Lab Results  Component Value Date   CHOL 146 08/22/2020   HDL 53 08/22/2020   LDLCALC 77 08/22/2020   TRIG 83 08/22/2020   CHOLHDL 2.8 08/22/2020    Significant Diagnostic Results in last 30 days:  CT HEAD WO CONTRAST (5MM)  Result Date: 04/19/2021 CLINICAL DATA:  Neuro deficit, acute, stroke suspected EXAM: CT HEAD WITHOUT CONTRAST TECHNIQUE: Contiguous axial images were obtained from the base of the skull through the vertex without intravenous contrast. COMPARISON:  02/22/2021 FINDINGS: Brain: There is atrophy and chronic small vessel disease changes. No acute intracranial abnormality. Specifically, no hemorrhage, hydrocephalus, mass lesion, acute infarction, or significant intracranial injury. Vascular: No hyperdense vessel or unexpected calcification. Skull: No acute calvarial abnormality. Sinuses/Orbits: No acute findings Other: None IMPRESSION: Atrophy, chronic microvascular disease. No acute intracranial abnormality. Electronically Signed   By: Rolm Baptise M.D.   On: 04/19/2021 22:52   MR BRAIN WO CONTRAST  Result Date: 04/20/2021 CLINICAL DATA:  Neuro deficit, acute, stroke suspected EXAM: MRI HEAD WITHOUT CONTRAST TECHNIQUE: Multiplanar, multiecho pulse sequences of the brain and surrounding structures were obtained without intravenous contrast. COMPARISON:  CT head10/20/2022. FINDINGS: Brain: No acute infarction, hemorrhage, hydrocephalus, extra-axial collection or mass lesion. Mild scattered T2 hyperintensities in the white matter, nonspecific but compatible with chronic microvascular disease. Mild atrophy. Vascular: Major arterial flow voids are maintained at the skull base.  Skull and upper cervical spine: Normal marrow signal. Sinuses/Orbits: Clear sinuses.  Unremarkable orbits Other: Small right and trace left mastoid effusions. IMPRESSION: 1. No evidence of acute  intracranial abnormality. 2. Mild chronic microvascular disease and atrophy. Electronically Signed   By: Margaretha Sheffield M.D.   On: 04/20/2021 06:55   MR CERVICAL SPINE WO CONTRAST  Result Date: 04/22/2021 CLINICAL DATA:  Motor neuron disease. Frequent falls with weakness of left lower extremities. 70 y.o. female past medical history significant for fatty liver, GERD, essential hypertension, osteomyelitis of the right toe, peripheral vascular disease, history of upper GI bleed due to peptic ulcers who was recently discharged from the hospital for gastroenteritis, iron overload and maculopapular rash she is coming in today complaining of progressive weakness both lower extremities, falls and numbness. EXAM: MRI CERVICAL SPINE WITHOUT CONTRAST TECHNIQUE: Multiplanar, multisequence MR imaging of the cervical spine was performed. No intravenous contrast was administered. COMPARISON:  Limited correlation made with cranial and thoracic MRI 01/18/2021. FINDINGS: Alignment: Near anatomic. There is a slight anterolisthesis at C4-5 and C7-T1. Vertebrae: No acute or suspicious osseous findings. Cord: Normal in signal and caliber. Posterior Fossa, vertebral arteries, paraspinal tissues: Visualized portions of the posterior fossa appear unremarkable. No significant paraspinal findings. Bilateral vertebral artery flow voids. Disc levels: C2-3: Mild disc bulging with moderate bilateral facet hypertrophy. The central spinal canal is widely patent. Moderate right and mild left foraminal narrowing. C3-4: Mild disc bulging with bilateral uncinate spurring. Moderate to severe bilateral facet hypertrophy, worse on the right. The CSF surrounding the cord is partially effaced without cord deformity. Moderate to severe right and mild left  foraminal narrowing. C4-5: There is loss of disc height with annular disc bulging and uncinate spurring. Moderate to severe facet hypertrophy bilaterally. The CSF surrounding the cord is effaced without cord deformity. Moderate to severe foraminal narrowing bilaterally which could affect either C5 nerve root. C5-6: Spondylosis with loss of disc height and posterior osteophytes asymmetric the right. Milder facet degenerative changes at this level. No cord deformity. Moderate to severe right and mild left foraminal narrowing. C6-7: Spondylosis with loss of disc height and posterior osteophytes asymmetric to the right. The CSF surrounding the cord is partially effaced with mild right-sided cord flattening. Moderate to severe right and moderate left foraminal narrowing. C7-T1: Mild loss of disc height with disc bulging, uncinate spurring and bilateral facet hypertrophy. The CSF surrounding the cord is partially effaced without cord deformity. Moderate to severe foraminal narrowing bilaterally. The visualized upper thoracic spine appears unchanged from the recent thoracic MRI. IMPRESSION: 1. Multilevel cervical spondylosis with disc bulging, uncinate spurring and facet hypertrophy as described. 2. There is mild central spinal stenosis at multiple levels as detailed above. There is mild cord flattening on the right at C6-7. No abnormal cord signal. 3. Multilevel significant osseous foraminal narrowing which could affect the exiting nerve roots. 4. No acute findings demonstrated. Electronically Signed   By: Richardean Sale M.D.   On: 04/22/2021 10:52   MR THORACIC SPINE WO CONTRAST  Result Date: 04/20/2021 CLINICAL DATA:  Ataxia, nontraumatic, T-spine pathology suspected; Low back pain, cauda equina syndrome suspected EXAM: MRI THORACIC AND LUMBAR SPINE WITHOUT CONTRAST TECHNIQUE: Multiplanar and multiecho pulse sequences of the thoracic and lumbar spine were obtained without intravenous contrast. COMPARISON:  CT  lumbar spine 02/22/2021. FINDINGS: MRI THORACIC SPINE FINDINGS Alignment:  Slight anterolisthesis of T1 on T2 and T2 on T3. Vertebrae: Vertebral body heights are maintained.Multilevel degenerative/discogenic endplate signal changes, greatest at T9-T10 where there is disc height loss. Otherwise, no focal marrow edema to suggest acute fracture or discitis/osteomyelitis. Heterogeneous bone marrow without suspicious bone lesion. Cord:  Normal cord signal. Paraspinal  and other soft tissues: Moderate left greater than right pleural effusions. Disc levels: Disc bulges and ligamentum flavum thickening at multiple levels throughout the imaged lower cervical and thoracic spine. Although partially imaged on sagittal imaging, probably mild to moderate canal stenosis and potentially at least moderate foraminal stenosis at C6-C7 and C7-T1. Mild canal stenosis at T5-T6, T7-T8, and T10-T11. Multilevel facet arthropathy. Foraminal stenosis is greatest and moderate on the right at T10-T11. Otherwise, multilevel mild foraminal stenosis in the thoracic spine. MRI LUMBAR SPINE FINDINGS Segmentation:  Standard Alignment: Similar grade 1 anterolisthesis of L2 on L3, L3 on L4, and L4 on L5. Vertebrae: Redemonstrated L3 and L4 compression fractures with similar height loss (35% L3 and 50% at L4). Edema within the superior endplates at these levels suggests that the fractures are unhealed. Edema within the L4 posterior elements. No substantial retropulsion. Conus medullaris and cauda equina: Conus extends to the T12-L1 level. Conus appears normal. Paraspinal and other soft tissues: Paraspinal muscular atrophy. Disc levels: T12-L1: No significant disc protrusion, foraminal stenosis, or canal stenosis. L1-L2: No significant disc protrusion, foraminal stenosis, or canal stenosis. L2-L3: Grade 1 anterolisthesis. Left eccentric disc bulge, bulky ligamentum flavum thickening and mild facet arthropathy. Prominent dorsal epidural fat. Resulting  moderate canal stenosis without significant foraminal stenosis. L3-L4: Grade 1 anterolisthesis. Left eccentric disc bulge with bulky ligamentum flavum thickening and moderate bilateral facet arthropathy. Prominent dorsal epidural fat. Resulting moderate to severe canal stenosis without significant foraminal stenosis. L4-L5: Grade 1 anterolisthesis. Uncovering the disc with uncovering the disc and superimposed broad disc bulge. Severe bilateral facet arthropathy with bulky ligamentum flavum thickening. Resulting severe canal stenosis. Severe right and moderate left foraminal stenosis. L5-S1: Mild broad disc bulge and endplate spurring with mild-to-moderate bilateral facet arthropathy. Resulting mild bilateral foraminal stenosis and mild bilateral subarticular recess stenosis without significant canal stenosis. IMPRESSION: MR LUMBAR SPINE IMPRESSION 1. Unhealed L3 and L4 compression fractures with similar height loss and marrow edema along the superior endplates. Edema within the L4 posterior elements could be posttraumatic or degenerative in etiology. 2. At L4-L5, severe canal stenosis with severe right and moderate left foraminal stenosis. 3. Moderate to severe canal stenosis at L3-L4 and moderate canal stenosis at L2-L3. MR THORACIC SPINE IMPRESSION 1. Moderate foraminal stenosis on the right at T10-T11. Otherwise, multilevel mild foraminal stenosis in the thoracic spine. 2. Multilevel degenerative disc disease and mild canal stenosis in the thoracic spine. 3. Potentially mild-to-moderate canal stenosis and at least moderate foraminal stenosis C6-C7 and C7-T1, partially only imaged on sagittal and incompletely assessed. Dedicated MRI of the cervical spine could further characterize if clinically indicated. 4. Moderate left greater than right pleural effusions. Consider dedicated chest imaging. Electronically Signed   By: Margaretha Sheffield M.D.   On: 04/20/2021 07:46   MR LUMBAR SPINE WO CONTRAST  Result Date:  04/20/2021 CLINICAL DATA:  Ataxia, nontraumatic, T-spine pathology suspected; Low back pain, cauda equina syndrome suspected EXAM: MRI THORACIC AND LUMBAR SPINE WITHOUT CONTRAST TECHNIQUE: Multiplanar and multiecho pulse sequences of the thoracic and lumbar spine were obtained without intravenous contrast. COMPARISON:  CT lumbar spine 02/22/2021. FINDINGS: MRI THORACIC SPINE FINDINGS Alignment:  Slight anterolisthesis of T1 on T2 and T2 on T3. Vertebrae: Vertebral body heights are maintained.Multilevel degenerative/discogenic endplate signal changes, greatest at T9-T10 where there is disc height loss. Otherwise, no focal marrow edema to suggest acute fracture or discitis/osteomyelitis. Heterogeneous bone marrow without suspicious bone lesion. Cord:  Normal cord signal. Paraspinal and other soft tissues: Moderate left greater  than right pleural effusions. Disc levels: Disc bulges and ligamentum flavum thickening at multiple levels throughout the imaged lower cervical and thoracic spine. Although partially imaged on sagittal imaging, probably mild to moderate canal stenosis and potentially at least moderate foraminal stenosis at C6-C7 and C7-T1. Mild canal stenosis at T5-T6, T7-T8, and T10-T11. Multilevel facet arthropathy. Foraminal stenosis is greatest and moderate on the right at T10-T11. Otherwise, multilevel mild foraminal stenosis in the thoracic spine. MRI LUMBAR SPINE FINDINGS Segmentation:  Standard Alignment: Similar grade 1 anterolisthesis of L2 on L3, L3 on L4, and L4 on L5. Vertebrae: Redemonstrated L3 and L4 compression fractures with similar height loss (35% L3 and 50% at L4). Edema within the superior endplates at these levels suggests that the fractures are unhealed. Edema within the L4 posterior elements. No substantial retropulsion. Conus medullaris and cauda equina: Conus extends to the T12-L1 level. Conus appears normal. Paraspinal and other soft tissues: Paraspinal muscular atrophy. Disc levels:  T12-L1: No significant disc protrusion, foraminal stenosis, or canal stenosis. L1-L2: No significant disc protrusion, foraminal stenosis, or canal stenosis. L2-L3: Grade 1 anterolisthesis. Left eccentric disc bulge, bulky ligamentum flavum thickening and mild facet arthropathy. Prominent dorsal epidural fat. Resulting moderate canal stenosis without significant foraminal stenosis. L3-L4: Grade 1 anterolisthesis. Left eccentric disc bulge with bulky ligamentum flavum thickening and moderate bilateral facet arthropathy. Prominent dorsal epidural fat. Resulting moderate to severe canal stenosis without significant foraminal stenosis. L4-L5: Grade 1 anterolisthesis. Uncovering the disc with uncovering the disc and superimposed broad disc bulge. Severe bilateral facet arthropathy with bulky ligamentum flavum thickening. Resulting severe canal stenosis. Severe right and moderate left foraminal stenosis. L5-S1: Mild broad disc bulge and endplate spurring with mild-to-moderate bilateral facet arthropathy. Resulting mild bilateral foraminal stenosis and mild bilateral subarticular recess stenosis without significant canal stenosis. IMPRESSION: MR LUMBAR SPINE IMPRESSION 1. Unhealed L3 and L4 compression fractures with similar height loss and marrow edema along the superior endplates. Edema within the L4 posterior elements could be posttraumatic or degenerative in etiology. 2. At L4-L5, severe canal stenosis with severe right and moderate left foraminal stenosis. 3. Moderate to severe canal stenosis at L3-L4 and moderate canal stenosis at L2-L3. MR THORACIC SPINE IMPRESSION 1. Moderate foraminal stenosis on the right at T10-T11. Otherwise, multilevel mild foraminal stenosis in the thoracic spine. 2. Multilevel degenerative disc disease and mild canal stenosis in the thoracic spine. 3. Potentially mild-to-moderate canal stenosis and at least moderate foraminal stenosis C6-C7 and C7-T1, partially only imaged on sagittal and  incompletely assessed. Dedicated MRI of the cervical spine could further characterize if clinically indicated. 4. Moderate left greater than right pleural effusions. Consider dedicated chest imaging. Electronically Signed   By: Margaretha Sheffield M.D.   On: 04/20/2021 07:46   ECHOCARDIOGRAM COMPLETE  Result Date: 04/21/2021    ECHOCARDIOGRAM REPORT   Patient Name:   [facility] Date of Exam: 04/21/2021 Medical Rec #:  [id number]          Height:       61.0 in Accession #:    [id number]         Weight:       117.0 lb Date of Birth:  [birth year]         BSA:          1.504 m Patient Age:    34 years           BP:           132/80 mmHg Patient Gender: F  HR:           100 bpm. Exam Location:  Inpatient Procedure: 2D Echo Indications:    Chest pain. Abnormal ecg.  History:        Patient has no prior history of Echocardiogram examinations.                 Pleural effusion; Risk Factors:Hypertension.  Sonographer:    Johny Chess RDCS Referring Phys: [id number] [place]  1. Left ventricular ejection fraction, by estimation, is 45 to 50%. The left ventricle has low normal function. The left ventricle demonstrates regional wall motion abnormalities (see scoring diagram/findings for description). Left ventricular diastolic  parameters are consistent with Grade I diastolic dysfunction (impaired relaxation).  2. Right ventricular systolic function is normal. The right ventricular size is normal. Tricuspid regurgitation signal is inadequate for assessing PA pressure.  3. Large pleural effusion in the left lateral region.  4. The mitral valve is normal in structure. Trivial mitral valve regurgitation. No evidence of mitral stenosis.  5. The aortic valve is tricuspid. There is mild calcification of the aortic valve. There is mild thickening of the aortic valve. Aortic valve regurgitation is not visualized. No aortic stenosis is present.  6. The inferior vena cava is normal in  size with greater than 50% respiratory variability, suggesting right atrial pressure of 3 mmHg. FINDINGS  Left Ventricle: The anteroseptal wall and apex appear hypokinetic. Left ventricular ejection fraction, by estimation, is 45 to 50%. The left ventricle has low normal function. The left ventricle demonstrates regional wall motion abnormalities. The left ventricular internal cavity size was normal in size. There is no left ventricular hypertrophy. Left ventricular diastolic parameters are consistent with Grade I diastolic dysfunction (impaired relaxation). Normal left ventricular filling pressure. Right Ventricle: The right ventricular size is normal. No increase in right ventricular wall thickness. Right ventricular systolic function is normal. Tricuspid regurgitation signal is inadequate for assessing PA pressure. Left Atrium: Left atrial size was normal in size. Right Atrium: Right atrial size was normal in size. Pericardium: There is no evidence of pericardial effusion. Mitral Valve: The mitral valve is normal in structure. Trivial mitral valve regurgitation. No evidence of mitral valve stenosis. Tricuspid Valve: The tricuspid valve is normal in structure. Tricuspid valve regurgitation is not demonstrated. No evidence of tricuspid stenosis. Aortic Valve: The aortic valve is tricuspid. There is mild calcification of the aortic valve. There is mild thickening of the aortic valve. There is mild aortic valve annular calcification. Aortic valve regurgitation is not visualized. No aortic stenosis  is present. Aortic valve mean gradient measures 2.5 mmHg. Aortic valve peak gradient measures 5.6 mmHg. Aortic valve area, by VTI measures 2.19 cm. Pulmonic Valve: The pulmonic valve was not well visualized. Pulmonic valve regurgitation is not visualized. No evidence of pulmonic stenosis. Aorta: The aortic root is normal in size and structure. Venous: The inferior vena cava is normal in size with greater than 50%  respiratory variability, suggesting right atrial pressure of 3 mmHg. IAS/Shunts: No atrial level shunt detected by color flow Doppler. Additional Comments: There is a large pleural effusion in the left lateral region.  LEFT VENTRICLE PLAX 2D LVIDd:         4.10 cm     Diastology LVIDs:         3.20 cm     LV e' medial:    6.20 cm/s LV PW:         0.90 cm  LV E/e' medial:  7.6 LV IVS:        0.80 cm     LV e' lateral:   5.98 cm/s LVOT diam:     2.00 cm     LV E/e' lateral: 7.9 LV SV:         46 LV SV Index:   30 LVOT Area:     3.14 cm  LV Volumes (MOD) LV vol d, MOD A2C: 50.8 ml LV vol d, MOD A4C: 68.8 ml LV vol s, MOD A2C: 28.6 ml LV vol s, MOD A4C: 45.8 ml LV SV MOD A2C:     22.2 ml LV SV MOD A4C:     68.8 ml LV SV MOD BP:      21.8 ml RIGHT VENTRICLE             IVC RV S prime:     14.70 cm/s  IVC diam: 1.00 cm TAPSE (M-mode): 1.6 cm LEFT ATRIUM             Index        RIGHT ATRIUM           Index LA diam:        2.40 cm 1.60 cm/m   RA Area:     12.10 cm LA Vol (A2C):   23.4 ml 15.56 ml/m  RA Volume:   27.10 ml  18.02 ml/m LA Vol (A4C):   45.2 ml 30.06 ml/m LA Biplane Vol: 34.7 ml 23.07 ml/m  AORTIC VALVE AV Area (Vmax):    2.47 cm AV Area (Vmean):   2.70 cm AV Area (VTI):     2.19 cm AV Vmax:           118.62 cm/s AV Vmean:          70.876 cm/s AV VTI:            0.210 m AV Peak Grad:      5.6 mmHg AV Mean Grad:      2.5 mmHg LVOT Vmax:         93.40 cm/s LVOT Vmean:        60.900 cm/s LVOT VTI:          0.146 m LVOT/AV VTI ratio: 0.70  AORTA Ao Root diam: 2.70 cm Ao Asc diam:  2.90 cm MV E velocity: 47.40 cm/s MV A velocity: 94.70 cm/s  SHUNTS MV E/A ratio:  0.50        Systemic VTI:  0.15 m                            Systemic Diam: 2.00 cm Carlyle Dolly MD Electronically signed by Carlyle Dolly MD Signature Date/Time: 04/21/2021/11:55:23 AM    Final     Assessment/Plan  1. Chest pain, unspecified type -  chest pain was relieved on 2nd dose of Nitroglycerin 0.4 mg SL -  Chest X-ray PA and  lateral -  EKG -   will discontinue discharge home order  2. Tachycardia -    has diagnosis of atrial tachycardia but not on medications -  will give Metoprolol tartrate 25 mg PO X 1 -  HR down to 107 -  refer for cardilogy consult  3. Essential hypertension, benign -  BP 140/90, will start on Metoprolol tartrate 25 mg 1/2 tab = 12.5 mg BID  4. Weakness of both lower extremities -  continue PT and OT  5.  Chronic low back  pain -  Will start on Norco 5-325 mg 1 tab every 12 hours PRN for pain   Family/ staff Communication:   Discussed plan of care with resident and charge nurse.  Labs/tests ordered:  EKG, CBC with differentials, BMP with GFR, Chest x-ray PA and lateral and troponin  Goals of care:   Short-term care   Durenda Age, DNP, MSN, FNP-BC [facility] [phone] (Monday-Friday 8:00 a.m. - 5:00 p.m.) [phone] (after hours)

## 2021-05-02 NOTE — ED Provider Notes (Signed)
[facility] Provider Note   CSN: [id number] Arrival date & time: 05/02/21  1849     History Chief Complaint  Patient presents with   Chest Pain   Abdominal Pain    [facility] is a 70 y.o. female who presents via EMS from her facility with concern for chest pain, which did not radiate, with associated shortness of breath, and no palpitations.  Chest pain resolved after 2 nitro.  Chest pain currently resolved and has not recurred since that time.  History of the same.  Patient also endorses abdominal "spasms" predominantly in her right lower abdomen that has been occurring since her recent admission which she states occur every 10 to 15 minutes when they are occurring.  Denies any diarrhea, melena, or hematochezia.  Denies any nausea or vomiting.  I have personally reviewed this patient's medical records. She was recently admitted earlier in October of this year for iron overload syndrome and then was again admitted for progressive weakness of the lower extremities bilaterally.  She is a Foley catheter in place for urinary retention but MRIs of the brain was normal.  MRI of the lumbar spine did reveal severe canal stenosis at L4-L5 with severe right and moderate foraminal stenosis.  Patient was post to follow-up with neurosurgery but has not yet had outpatient follow-up.  They were consulted on 10/21 on inpatient service and recommend she may need surgical intervention in the future but not on any acute intervention. LVEF of 45-50%.  HPI     Past Medical History:  Diagnosis Date   Fatty liver    GERD (gastroesophageal reflux disease)    Hypertension    Osteomyelitis ([place])    right third toe   Peripheral vascular disease ([place])    Ulcer 08/14/20    Patient Active Problem List   Diagnosis Date Noted   Weakness of left lower extremity 04/20/2021   Asymptomatic bacteriuria 04/20/2021   Prolonged QT interval 04/20/2021   Protein-calorie  malnutrition, severe 04/03/2021   Gastritis 03/31/2021   Nausea and vomiting 03/31/2021   Iron overload 02/13/2021   Neuropathy 02/13/2021   Elevated LFTs 02/13/2021   Weight loss, non-intentional 02/13/2021   Falls frequently 02/13/2021   GI bleed 08/13/2020   UGIB (upper gastrointestinal bleed) 08/12/2020   Depression 08/12/2020   Hypokalemia 08/12/2020   Essential hypertension, benign 03/23/2019    Past Surgical History:  Procedure Laterality Date   AMPUTATION Right 08/08/2017   Procedure: AMPUTATION RIGHT 3RD TOE;  Surgeon: Newt Minion, MD;  Location: [facility];  Service: Orthopedics;  Laterality: Right;   BIOPSY  08/14/2020   Procedure: BIOPSY;  Surgeon: Clarene Essex, MD;  Location: [facility];  Service: Endoscopy;;   Bunionectomy Right 2006     Right   Bunionectony Left 2006      August   COLONOSCOPY N/A 01/21/2014   Procedure: COLONOSCOPY;  Surgeon: Beryle Beams, MD;  Location: [facility];  Service: Endoscopy;  Laterality: N/A;   ESOPHAGOGASTRODUODENOSCOPY (EGD) WITH PROPOFOL N/A 08/14/2020   Procedure: ESOPHAGOGASTRODUODENOSCOPY (EGD) WITH PROPOFOL;  Surgeon: Clarene Essex, MD;  Location: [facility];  Service: Endoscopy;  Laterality: N/A;   TUBAL LIGATION     WISDOM TOOTH EXTRACTION       OB History   No obstetric history on file.     Family History  Problem Relation Age of Onset   Breast cancer Mother    Colon cancer Mother    Heart Problems Mother  Hypertension Mother    Multiple myeloma Mother    Arthritis Mother    Cancer Mother    Heart disease Mother    Hypertension Father    Heart disease Father    Multiple myeloma Father    Arthritis Father    Cancer Father     Social History   Tobacco Use   Smoking status: Never   Smokeless tobacco: Never  Vaping Use   Vaping Use: Never used  Substance Use Topics   Alcohol use: Yes    Alcohol/week: 5.0 standard drinks    Types: 5 Glasses of wine per week    Comment: social glass of wine   Drug use:  No    Home Medications Prior to Admission medications   Medication Sig Start Date End Date Taking? Authorizing Provider  acetaminophen (TYLENOL) 325 MG tablet Take 650 mg by mouth every 8 (eight) hours as needed.    [provider]  bisacodyl (DULCOLAX) 10 MG suppository If not relieved by MOM, give 10 mg Bisacodyl suppositiory rectally X 1 dose in 24 hours as needed    [provider]  Cholecalciferol (VITAMIN D3) 5000 units CAPS Take 5,000 Units by mouth daily.    [provider]  feeding supplement (ENSURE ENLIVE / ENSURE PLUS) LIQD Take 237 mLs by mouth 3 (three) times daily between meals. 04/04/21   Mariel Aloe, MD  HYDROcodone-acetaminophen (NORCO) 5-325 MG tablet Take 1 tablet by mouth every 12 (twelve) hours as needed for moderate pain. 05/02/21   Medina-Vargas, Monina C, NP  hydrOXYzine (VISTARIL) 25 MG capsule Take 1 capsule (25 mg total) by mouth 2 (two) times daily as needed. 04/26/21   Medina-Vargas, Monina C, NP  Magnesium Hydroxide (MILK OF MAGNESIA PO) Take by mouth. Constipation (1 of 4): If no BM in 3 days, give 30 cc Milk of Magnesium p.o. x 1 dose in 24 hours as needed    [provider]  Multiple Vitamin (MULTIVITAMIN WITH MINERALS) TABS tablet Take 1 tablet by mouth daily.    [provider]  NON FORMULARY DIET: NAS/HEART HEALTHY    [provider]  nystatin (MYCOSTATIN) 100000 UNIT/ML suspension SWISH 5 ML BY MOUTH AND SPIT OUT FOUR TIMES DAILY X 2 WEEKS FOR ORAL YEAST    [provider]  ondansetron (ZOFRAN ODT) 4 MG disintegrating tablet Take 1 tablet (4 mg total) by mouth every 8 (eight) hours as needed for nausea or vomiting. 04/04/21   Mariel Aloe, MD  pantoprazole (PROTONIX) 40 MG tablet Take 1 tablet (40 mg total) by mouth daily. 04/04/21   Mariel Aloe, MD  potassium chloride SA (KLOR-CON) 20 MEQ tablet Take 1 tablet (20 mEq total) by mouth 2 (two) times daily. 02/14/21   Magrinat, Virgie Dad, MD   sertraline (ZOLOFT) 25 MG tablet Take 1 tablet (25 mg total) by mouth daily. 02/01/21   Bary Castilla, NP  Sodium Phosphates (RA SALINE ENEMA RE) Place rectally. If not relieved by Biscodyl suppository, give disposable Saline Enema rectally X 1 dose/24 hrs as needed    [provider]  Tetrahydrozoline HCl (VISINE OP) Place 2 drops into both eyes daily as needed (for dry/irritated eyes).     [provider]    Allergies    Patient has no known allergies.  Review of Systems   Review of Systems  Constitutional: Negative.   HENT: Negative.    Respiratory:  Positive for shortness of breath. Negative for chest tightness and wheezing.  Cardiovascular:  Positive for chest pain. Negative for palpitations and leg swelling.  Gastrointestinal:  Positive for abdominal pain. Negative for diarrhea, nausea and vomiting.  Genitourinary: Negative.   Musculoskeletal: Negative.   Skin: Negative.   Neurological:  Positive for weakness. Negative for dizziness, seizures, facial asymmetry, speech difficulty, light-headedness, numbness and headaches.   Physical Exam Updated Vital Signs BP 114/80   Pulse 100   Resp 16   Ht _0  (1.549 m)   Wt 65.7 kg   SpO2 100%   BMI 27.36 kg/m   Physical Exam Vitals and nursing note reviewed.  Constitutional:      Appearance: She is obese. She is not ill-appearing or toxic-appearing.  HENT:     Head: Normocephalic and atraumatic.     Nose: Nose normal.     Mouth/Throat:     Mouth: Mucous membranes are moist.     Pharynx: Oropharynx is clear. Uvula midline. No oropharyngeal exudate or posterior oropharyngeal erythema.     Tonsils: No tonsillar exudate.  Eyes:     General: Lids are normal. Vision grossly intact.        Right eye: No discharge.        Left eye: No discharge.     Extraocular Movements: Extraocular movements intact.     Conjunctiva/sclera: Conjunctivae normal.     Pupils: Pupils are equal, round, and reactive to light.   Neck:     Trachea: Trachea and phonation normal.  Cardiovascular:     Rate and Rhythm: Normal rate and regular rhythm.     Pulses: Normal pulses.     Heart sounds: Normal heart sounds.  Pulmonary:     Effort: Pulmonary effort is normal. No tachypnea, bradypnea, accessory muscle usage, prolonged expiration or respiratory distress.     Breath sounds: Normal breath sounds. No wheezing or rales.  Chest:     Chest wall: No mass, lacerations, deformity, swelling, tenderness, crepitus or edema.  Abdominal:     General: Bowel sounds are normal. There is no distension.     Tenderness: There is abdominal tenderness in the right lower quadrant and suprapubic area. There is right CVA tenderness. There is no left CVA tenderness, guarding or rebound.  Musculoskeletal:        General: No deformity.     Cervical back: Normal range of motion and neck supple. No edema, rigidity or crepitus. No pain with movement, spinous process tenderness or muscular tenderness.     Right lower leg: Tenderness present. 2+ Pitting Edema present.     Left lower leg: Tenderness present. 3+ Pitting Edema present.  Lymphadenopathy:     Cervical: No cervical adenopathy.  Skin:    General: Skin is warm and dry.     Capillary Refill: Capillary refill takes less than 2 seconds.  Neurological:     General: No focal deficit present.     Mental Status: She is alert and oriented to person, place, and time. Mental status is at baseline.     GCS: GCS eye subscore is 4. GCS verbal subscore is 5. GCS motor subscore is 6.     Sensory: Sensation is intact.  Psychiatric:        Mood and Affect: Mood normal.    ED Results / Procedures / Treatments   Labs (all labs ordered are listed, but only abnormal results are displayed) Labs Reviewed  COMPREHENSIVE METABOLIC PANEL - Abnormal; Notable for the following components:      Result Value   Sodium 132 (*)  CO2 21 (*)    Calcium 7.9 (*)    Total Protein 4.4 (*)    Albumin 1.5  (*)    Alkaline Phosphatase 128 (*)    All other components within normal limits  CBC WITH DIFFERENTIAL/PLATELET - Abnormal; Notable for the following components:   RBC 3.46 (*)    Hemoglobin 11.8 (*)    HCT 32.2 (*)    MCH 34.1 (*)    MCHC 36.6 (*)    RDW 16.3 (*)    Abs Immature Granulocytes 0.09 (*)    All other components within normal limits  URINALYSIS, ROUTINE W REFLEX MICROSCOPIC - Abnormal; Notable for the following components:   Color, Urine AMBER (*)    APPearance CLOUDY (*)    Hgb urine dipstick LARGE (*)    Protein, ur 100 (*)    Leukocytes,Ua MODERATE (*)    RBC / HPF >50 (*)    WBC, UA >50 (*)    Bacteria, UA FEW (*)    Non Squamous Epithelial 0-5 (*)    All other components within normal limits  TROPONIN I (HIGH SENSITIVITY) - Abnormal; Notable for the following components:   Troponin I (High Sensitivity) 28 (*)    All other components within normal limits  URINE CULTURE  LIPASE, BLOOD  BRAIN NATRIURETIC PEPTIDE  TROPONIN I (HIGH SENSITIVITY)    EKG EKG: sinus tachycardia, no STEMI.  Radiology DG Chest 2 View  Result Date: 05/02/2021 CLINICAL DATA:  Shortness of breath EXAM: CHEST - 2 VIEW COMPARISON:  08/12/2020 FINDINGS: Bandlike opacity in the right mid lung, likely atelectasis. Moderate retrocardiac left basilar opacity. Mild cardiomegaly. IMPRESSION: Right mid lung atelectasis. Left basilar opacities may indicate atelectasis or infection. Electronically Signed   By: Ulyses Jarred M.D.   On: 05/02/2021 20:48    Procedures Procedures   Medications Ordered in ED Medications - No data to display  ED Course  I have reviewed the triage vital signs and the nursing notes.  Pertinent labs & imaging results that were available during my care of the patient were reviewed by me and considered in my medical decision making (see chart for details).    MDM Rules/Calculators/A&P                         70 year old female presents with concern for chest pain  today that was resolved with nitroglycerin.  Differential diagnosis includes but is limited to ACS, PE, pleural effusion, pneumothorax, pneumonia, myocarditis, GERD, PUD.  Tachycardic on intake, vital signs otherwise normal.  Cardiopulmonary exam is normal, abdominal exam is significant for right lower quadrant and  suprapubic tenderness palpation with right-sided CVA tenderness.  Patient is neurovascular intact in all 4 extremities but with bilateral lower extremity edema that is pitting.  Per patient and her husband this is not new.  CBC with mild anemia with hemoglobin 11.8 down from patient's baseline near 13.  CMP with mild hyponatremia of 132, otherwise unremarkable.  UA with large amount of hemoglobin, moderate leukocytes, great red blood cells, grade white blood cells, and few bacteria.  Patient with indwelling Foley.  Cultures pending.  Significant delay in CMP and troponin values.  Troponin elevated to 28.  Patient pending CTA chest and CT abdomen pelvis at time of shift change.  Care of this patient signed out to oncoming ED provider Lorre Munroe, PA-C.  All pertinent HPI, physical exam, laboratory findings were discussed with him prior to my departure.  I appreciate  his collaboration in the care of the patient.  Jaylissa voiced understanding of her medical evaluation and treatment plan thus far.  Each of her questions was answered to her expressed satisfaction.  Suspect admission for disposition pending CTs at this time.  This chart was dictated using voice recognition software, Dragon. Despite the best efforts of this provider to proofread and correct errors, errors may still occur which can change documentation meaning.  Final Clinical Impression(s) / ED Diagnoses Final diagnoses:  None    Rx / DC Orders ED Discharge Orders     None        Aura Dials 05/02/21 2353    Teressa Lower, MD 05/07/21 [phone]

## 2021-05-02 NOTE — ED Triage Notes (Signed)
Arrives via EMS from [place] for CP. EMS reports CP was resolved after 1 nitro given by facility. Pt c/o abd/pelvic cramps worse when bladder is full. Pt currently being treated for UTI.

## 2021-05-02 NOTE — Addendum Note (Signed)
Addended by: Durenda Age C on: 05/02/2021 05:30 PM   Modules accepted: Orders

## 2021-05-03 ENCOUNTER — Emergency Department (HOSPITAL_COMMUNITY): Payer: Medicare HMO

## 2021-05-03 ENCOUNTER — Observation Stay (HOSPITAL_COMMUNITY): Payer: Medicare HMO

## 2021-05-03 ENCOUNTER — Ambulatory Visit: Payer: Medicare HMO

## 2021-05-03 ENCOUNTER — Observation Stay (HOSPITAL_BASED_OUTPATIENT_CLINIC_OR_DEPARTMENT_OTHER): Payer: Medicare HMO

## 2021-05-03 ENCOUNTER — Encounter (HOSPITAL_COMMUNITY): Payer: Self-pay | Admitting: Family Medicine

## 2021-05-03 ENCOUNTER — Telehealth: Payer: Self-pay

## 2021-05-03 DIAGNOSIS — I420 Dilated cardiomyopathy: Secondary | ICD-10-CM | POA: Diagnosis not present

## 2021-05-03 DIAGNOSIS — I824Y2 Acute embolism and thrombosis of unspecified deep veins of left proximal lower extremity: Secondary | ICD-10-CM | POA: Diagnosis not present

## 2021-05-03 DIAGNOSIS — I82422 Acute embolism and thrombosis of left iliac vein: Secondary | ICD-10-CM | POA: Diagnosis present

## 2021-05-03 DIAGNOSIS — R079 Chest pain, unspecified: Secondary | ICD-10-CM | POA: Diagnosis not present

## 2021-05-03 DIAGNOSIS — J918 Pleural effusion in other conditions classified elsewhere: Secondary | ICD-10-CM | POA: Diagnosis present

## 2021-05-03 DIAGNOSIS — M48061 Spinal stenosis, lumbar region without neurogenic claudication: Secondary | ICD-10-CM | POA: Diagnosis not present

## 2021-05-03 DIAGNOSIS — R601 Generalized edema: Secondary | ICD-10-CM | POA: Diagnosis not present

## 2021-05-03 DIAGNOSIS — I472 Ventricular tachycardia, unspecified: Secondary | ICD-10-CM | POA: Diagnosis not present

## 2021-05-03 DIAGNOSIS — J9811 Atelectasis: Secondary | ICD-10-CM | POA: Diagnosis not present

## 2021-05-03 DIAGNOSIS — I5043 Acute on chronic combined systolic (congestive) and diastolic (congestive) heart failure: Secondary | ICD-10-CM | POA: Diagnosis present

## 2021-05-03 DIAGNOSIS — I462 Cardiac arrest due to underlying cardiac condition: Secondary | ICD-10-CM | POA: Diagnosis not present

## 2021-05-03 DIAGNOSIS — Z800 Family history of malignant neoplasm of digestive organs: Secondary | ICD-10-CM | POA: Diagnosis not present

## 2021-05-03 DIAGNOSIS — J90 Pleural effusion, not elsewhere classified: Secondary | ICD-10-CM | POA: Diagnosis present

## 2021-05-03 DIAGNOSIS — I4581 Long QT syndrome: Secondary | ICD-10-CM | POA: Diagnosis not present

## 2021-05-03 DIAGNOSIS — K760 Fatty (change of) liver, not elsewhere classified: Secondary | ICD-10-CM | POA: Diagnosis present

## 2021-05-03 DIAGNOSIS — R609 Edema, unspecified: Secondary | ICD-10-CM | POA: Diagnosis not present

## 2021-05-03 DIAGNOSIS — Z807 Family history of other malignant neoplasms of lymphoid, hematopoietic and related tissues: Secondary | ICD-10-CM | POA: Diagnosis not present

## 2021-05-03 DIAGNOSIS — I4901 Ventricular fibrillation: Secondary | ICD-10-CM | POA: Diagnosis not present

## 2021-05-03 DIAGNOSIS — I428 Other cardiomyopathies: Secondary | ICD-10-CM | POA: Diagnosis present

## 2021-05-03 DIAGNOSIS — E878 Other disorders of electrolyte and fluid balance, not elsewhere classified: Secondary | ICD-10-CM | POA: Diagnosis not present

## 2021-05-03 DIAGNOSIS — E871 Hypo-osmolality and hyponatremia: Secondary | ICD-10-CM | POA: Diagnosis present

## 2021-05-03 DIAGNOSIS — J811 Chronic pulmonary edema: Secondary | ICD-10-CM | POA: Diagnosis not present

## 2021-05-03 DIAGNOSIS — I82409 Acute embolism and thrombosis of unspecified deep veins of unspecified lower extremity: Secondary | ICD-10-CM | POA: Diagnosis present

## 2021-05-03 DIAGNOSIS — I110 Hypertensive heart disease with heart failure: Secondary | ICD-10-CM | POA: Diagnosis present

## 2021-05-03 DIAGNOSIS — I429 Cardiomyopathy, unspecified: Secondary | ICD-10-CM | POA: Diagnosis not present

## 2021-05-03 DIAGNOSIS — R9431 Abnormal electrocardiogram [ECG] [EKG]: Secondary | ICD-10-CM | POA: Diagnosis not present

## 2021-05-03 DIAGNOSIS — Z803 Family history of malignant neoplasm of breast: Secondary | ICD-10-CM | POA: Diagnosis not present

## 2021-05-03 DIAGNOSIS — R109 Unspecified abdominal pain: Secondary | ICD-10-CM | POA: Diagnosis not present

## 2021-05-03 DIAGNOSIS — M7989 Other specified soft tissue disorders: Secondary | ICD-10-CM

## 2021-05-03 DIAGNOSIS — N280 Ischemia and infarction of kidney: Secondary | ICD-10-CM | POA: Diagnosis not present

## 2021-05-03 DIAGNOSIS — I4729 Other ventricular tachycardia: Secondary | ICD-10-CM | POA: Diagnosis not present

## 2021-05-03 DIAGNOSIS — R112 Nausea with vomiting, unspecified: Secondary | ICD-10-CM | POA: Diagnosis not present

## 2021-05-03 DIAGNOSIS — I469 Cardiac arrest, cause unspecified: Secondary | ICD-10-CM | POA: Diagnosis not present

## 2021-05-03 DIAGNOSIS — I4721 Torsades de pointes: Secondary | ICD-10-CM | POA: Diagnosis not present

## 2021-05-03 DIAGNOSIS — I509 Heart failure, unspecified: Secondary | ICD-10-CM | POA: Diagnosis not present

## 2021-05-03 DIAGNOSIS — I5021 Acute systolic (congestive) heart failure: Secondary | ICD-10-CM | POA: Diagnosis not present

## 2021-05-03 DIAGNOSIS — E8809 Other disorders of plasma-protein metabolism, not elsewhere classified: Secondary | ICD-10-CM | POA: Diagnosis not present

## 2021-05-03 DIAGNOSIS — I517 Cardiomegaly: Secondary | ICD-10-CM | POA: Diagnosis not present

## 2021-05-03 DIAGNOSIS — J9601 Acute respiratory failure with hypoxia: Secondary | ICD-10-CM | POA: Diagnosis present

## 2021-05-03 DIAGNOSIS — R5381 Other malaise: Secondary | ICD-10-CM | POA: Diagnosis present

## 2021-05-03 DIAGNOSIS — F32A Depression, unspecified: Secondary | ICD-10-CM | POA: Diagnosis present

## 2021-05-03 DIAGNOSIS — G8929 Other chronic pain: Secondary | ICD-10-CM | POA: Diagnosis present

## 2021-05-03 DIAGNOSIS — I82411 Acute embolism and thrombosis of right femoral vein: Secondary | ICD-10-CM | POA: Diagnosis present

## 2021-05-03 DIAGNOSIS — Z20822 Contact with and (suspected) exposure to covid-19: Secondary | ICD-10-CM | POA: Diagnosis present

## 2021-05-03 DIAGNOSIS — R197 Diarrhea, unspecified: Secondary | ICD-10-CM | POA: Diagnosis not present

## 2021-05-03 DIAGNOSIS — R634 Abnormal weight loss: Secondary | ICD-10-CM | POA: Diagnosis not present

## 2021-05-03 DIAGNOSIS — R339 Retention of urine, unspecified: Secondary | ICD-10-CM | POA: Diagnosis present

## 2021-05-03 DIAGNOSIS — R188 Other ascites: Secondary | ICD-10-CM | POA: Diagnosis not present

## 2021-05-03 DIAGNOSIS — E43 Unspecified severe protein-calorie malnutrition: Secondary | ICD-10-CM | POA: Diagnosis not present

## 2021-05-03 DIAGNOSIS — I5082 Biventricular heart failure: Secondary | ICD-10-CM | POA: Diagnosis present

## 2021-05-03 DIAGNOSIS — R0600 Dyspnea, unspecified: Secondary | ICD-10-CM | POA: Diagnosis present

## 2021-05-03 HISTORY — PX: IR THORACENTESIS ASP PLEURAL SPACE W/IMG GUIDE: IMG5380

## 2021-05-03 LAB — RESP PANEL BY RT-PCR (FLU A&B, COVID) ARPGX2
Influenza A by PCR: NEGATIVE
Influenza B by PCR: NEGATIVE
SARS Coronavirus 2 by RT PCR: NEGATIVE

## 2021-05-03 LAB — MAGNESIUM: Magnesium: 1.2 mg/dL — ABNORMAL LOW (ref 1.7–2.4)

## 2021-05-03 LAB — HEPARIN LEVEL (UNFRACTIONATED)
Heparin Unfractionated: >1.1 IU/mL — ABNORMAL HIGH (ref 0.30–0.70)
Heparin Unfractionated: 0.34 IU/mL (ref 0.30–0.70)

## 2021-05-03 LAB — PROTEIN, PLEURAL OR PERITONEAL FLUID: Total protein, fluid: <3 g/dL

## 2021-05-03 LAB — BODY FLUID CELL COUNT WITH DIFFERENTIAL
Eos, Fluid: 0 %
Lymphs, Fluid: 57 %
Monocyte-Macrophage-Serous Fluid: 10 % — ABNORMAL LOW (ref 50–90)
Neutrophil Count, Fluid: 33 % — ABNORMAL HIGH (ref 0–25)
Total Nucleated Cell Count, Fluid: 268 cu mm (ref 0–1000)

## 2021-05-03 LAB — ALBUMIN, PLEURAL OR PERITONEAL FLUID: Albumin, Fluid: <1.5 g/dL

## 2021-05-03 LAB — TROPONIN I (HIGH SENSITIVITY): Troponin I (High Sensitivity): 32 ng/L — ABNORMAL HIGH (ref <18)

## 2021-05-03 LAB — BRAIN NATRIURETIC PEPTIDE: B Natriuretic Peptide: 1679.7 pg/mL — ABNORMAL HIGH (ref 0.0–100.0)

## 2021-05-03 LAB — GRAM STAIN

## 2021-05-03 LAB — GLUCOSE, PLEURAL OR PERITONEAL FLUID: Glucose, Fluid: 99 mg/dL

## 2021-05-03 LAB — LACTATE DEHYDROGENASE, PLEURAL OR PERITONEAL FLUID: LD, Fluid: 55 U/L — ABNORMAL HIGH (ref 3–23)

## 2021-05-03 IMAGING — DX DG CHEST 1V
1 series · 1 of 1 positions shown · non-contrast
Comparison: CT earlier today.  Radiograph yesterday.

CLINICAL DATA: Status post thoracentesis on the right.

EXAM:
CHEST  1 VIEW

[chest ap]
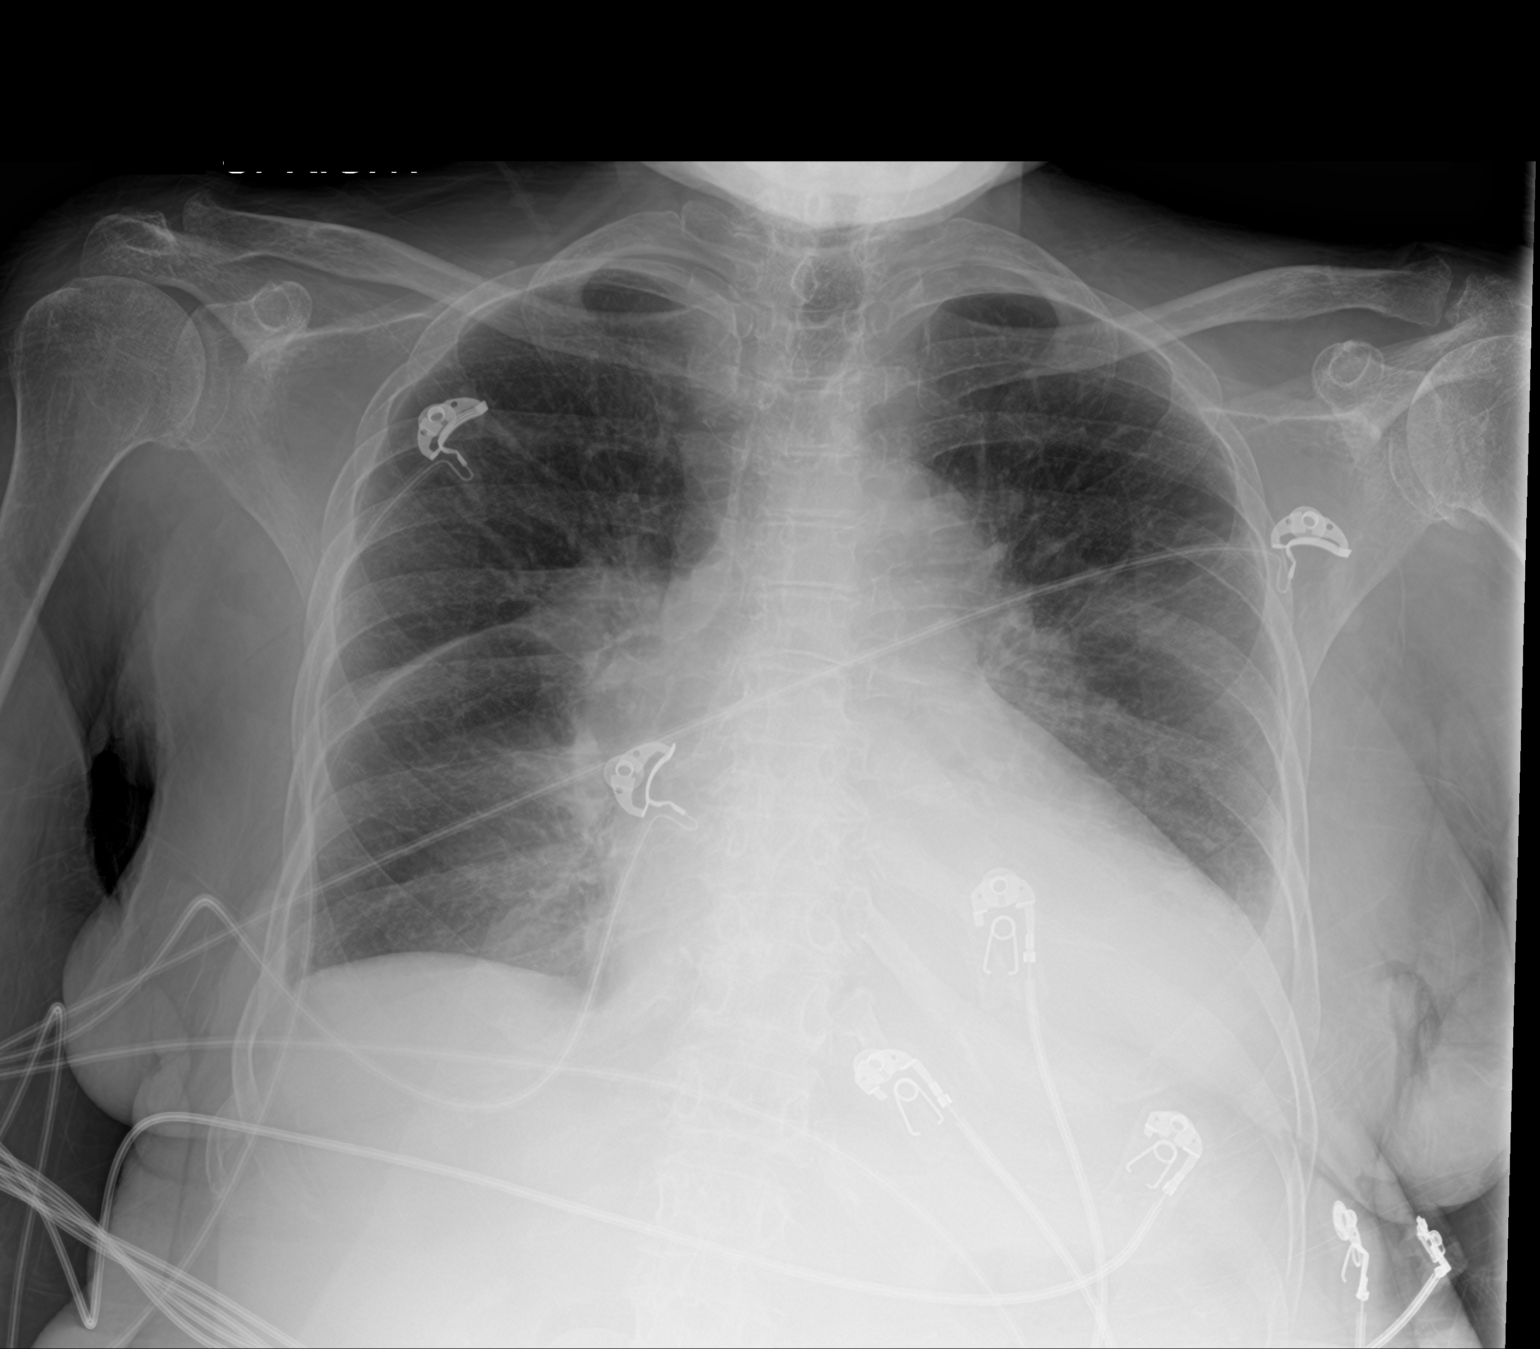

[1 of 1 positions shown; findings below may reference images not displayed]

FINDINGS: No pneumothorax post right thoracentesis. Right pleural effusion was
previously not well seen by radiograph. There is similar blunting of
the costophrenic angle. Fluid is seen tracking in the right minor
fissure. Unchanged left pleural effusion and basilar opacity. Stable
cardiomegaly. No pulmonary edema.
IMPRESSION: 1. No pneumothorax post right thoracentesis.
2. Unchanged left pleural effusion and basilar opacity.
3. Stable cardiomegaly.

## 2021-05-03 IMAGING — US IR THORACENTESIS ASP PLEURAL SPACE W/IMG GUIDE
1 series · 2 of 2 positions shown · non-contrast
Comparison: none

INDICATION: Patient with a history of heart failure presents today with
bilateral pleural effusions. Interventional radiology asked to
perform a diagnostic and therapeutic thoracentesis.

[Series 1: ir (id) (id)/(id)/(id) ir · 2 of 2 slices shown]
[im 1/2]
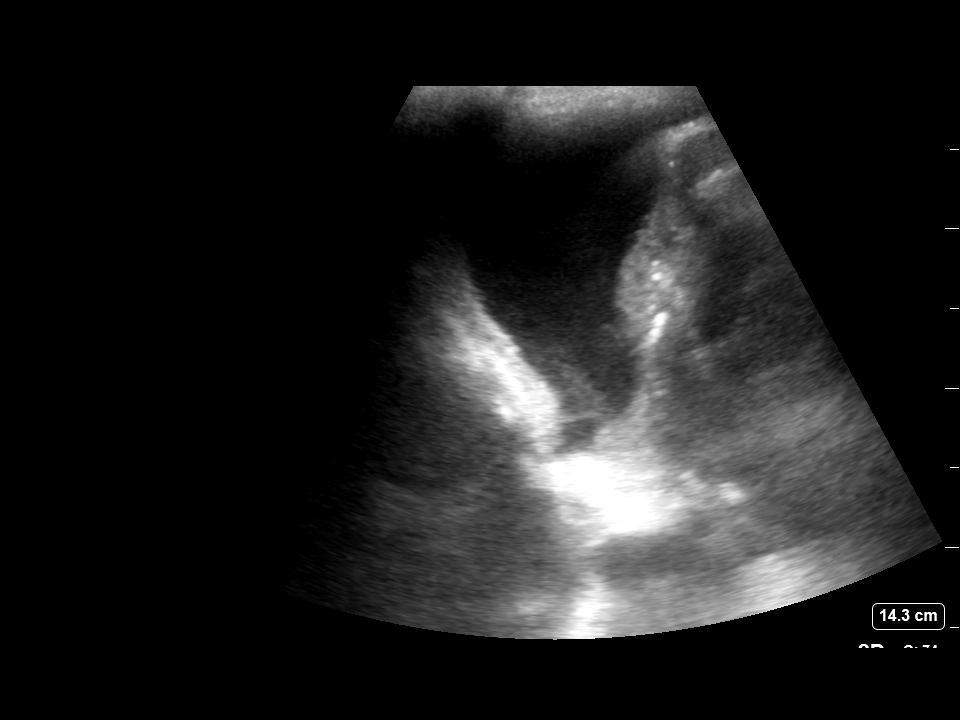
[im 2/2]
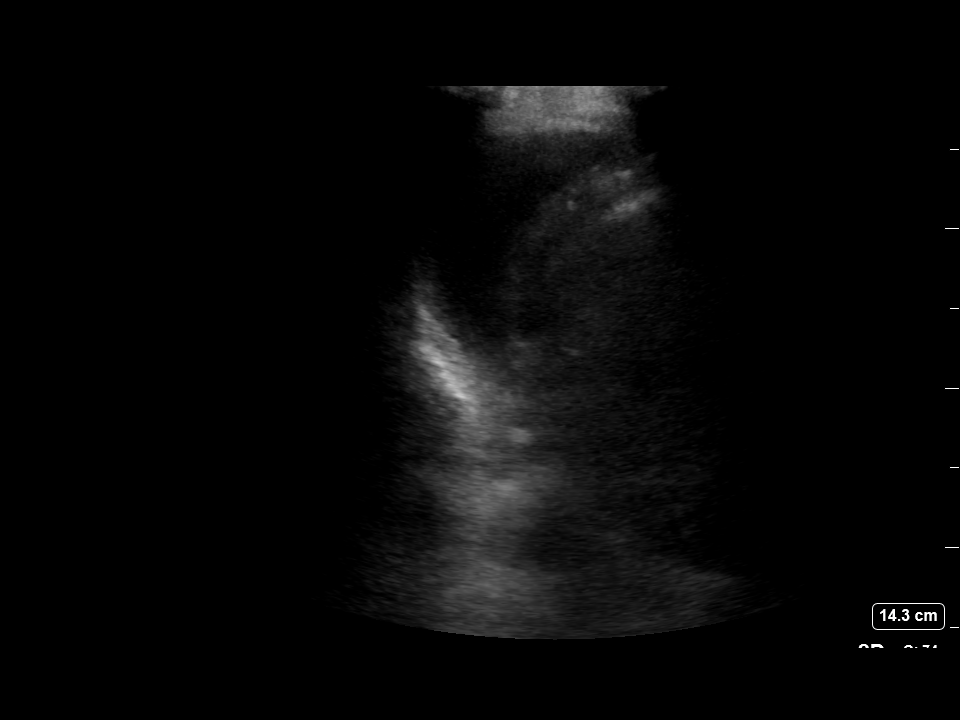

[2 of 2 positions shown; findings below may reference images not displayed]

EXAM:
ULTRASOUND GUIDED THORACENTESIS

MEDICATIONS:
1% lidocaine 20 mL

COMPLICATIONS:
None immediate.

PROCEDURE:
An ultrasound guided thoracentesis was thoroughly discussed with the
patient and questions answered. The benefits, risks, alternatives
and complications were also discussed. The patient understands and
wishes to proceed with the procedure. Written consent was obtained.

Ultrasound was performed to localize and mark an adequate pocket of
fluid in the right chest. The area was then prepped and draped in
the normal sterile fashion. 1% Lidocaine was used for local
anesthesia. Under ultrasound guidance a 6 Fr Safe-T-Centesis
catheter was introduced. Thoracentesis was performed. The catheter
was removed and a dressing applied.
FINDINGS: A total of approximately 500 mL of clear yellow fluid was removed.
Samples were sent to the laboratory as requested by the clinical
team.
IMPRESSION: Successful ultrasound guided right thoracentesis yielding 500 mL of
pleural fluid. Read by: LIENAD, NP

## 2021-05-03 MED ORDER — HEPARIN BOLUS VIA INFUSION
4000.0000 [IU] | Freq: Once | INTRAVENOUS | Status: AC
  Administered 2021-05-03: 4000 [IU] via INTRAVENOUS
  Filled 2021-05-03: qty 4000

## 2021-05-03 MED ORDER — ACETAMINOPHEN 325 MG PO TABS
650.0000 mg | ORAL_TABLET | ORAL | Status: DC | PRN
  Administered 2021-05-03 – 2021-05-11 (×17): 650 mg via ORAL
  Filled 2021-05-03 (×17): qty 2

## 2021-05-03 MED ORDER — SODIUM CHLORIDE 0.9% FLUSH
3.0000 mL | Freq: Two times a day (BID) | INTRAVENOUS | Status: DC
  Administered 2021-05-04 – 2021-05-10 (×12): 3 mL via INTRAVENOUS

## 2021-05-03 MED ORDER — LIDOCAINE HCL 1 % IJ SOLN
INTRAMUSCULAR | Status: AC
  Filled 2021-05-03: qty 20

## 2021-05-03 MED ORDER — SODIUM CHLORIDE 0.9 % IV SOLN
250.0000 mL | INTRAVENOUS | Status: DC | PRN
  Administered 2021-05-09: 500 mL via INTRAVENOUS

## 2021-05-03 MED ORDER — PANTOPRAZOLE SODIUM 40 MG PO TBEC
40.0000 mg | DELAYED_RELEASE_TABLET | Freq: Every day | ORAL | Status: DC
  Administered 2021-05-03 – 2021-05-08 (×6): 40 mg via ORAL
  Filled 2021-05-03 (×6): qty 1

## 2021-05-03 MED ORDER — IOHEXOL 300 MG/ML  SOLN
100.0000 mL | Freq: Once | INTRAMUSCULAR | Status: AC | PRN
  Administered 2021-05-03: 100 mL via INTRAVENOUS

## 2021-05-03 MED ORDER — MAGNESIUM SULFATE 4 GM/100ML IV SOLN
4.0000 g | Freq: Once | INTRAVENOUS | Status: AC
  Administered 2021-05-03: 4 g via INTRAVENOUS
  Filled 2021-05-03: qty 100

## 2021-05-03 MED ORDER — IOHEXOL 350 MG/ML SOLN
64.0000 mL | Freq: Once | INTRAVENOUS | Status: AC | PRN
  Administered 2021-05-03: 64 mL via INTRAVENOUS

## 2021-05-03 MED ORDER — SODIUM CHLORIDE 0.9% FLUSH: 3.0000 mL | INTRAVENOUS | Status: DC | PRN

## 2021-05-03 MED ORDER — LIDOCAINE HCL (PF) 1 % IJ SOLN
INTRAMUSCULAR | Status: DC | PRN
  Administered 2021-05-03: 30 mL
  Administered 2021-05-03: 5 mL
  Administered 2021-05-11 (×2): 15 mL
  Administered 2021-05-11: 5 mL

## 2021-05-03 MED ORDER — FUROSEMIDE 10 MG/ML IJ SOLN
20.0000 mg | Freq: Two times a day (BID) | INTRAMUSCULAR | Status: DC
  Administered 2021-05-03 – 2021-05-04 (×5): 20 mg via INTRAVENOUS
  Filled 2021-05-03 (×5): qty 2

## 2021-05-03 MED ORDER — HEPARIN (PORCINE) 25000 UT/250ML-% IV SOLN
800.0000 [IU]/h | INTRAVENOUS | Status: DC
  Administered 2021-05-03: 1000 [IU]/h via INTRAVENOUS
  Administered 2021-05-04: 800 [IU]/h via INTRAVENOUS
  Filled 2021-05-03 (×2): qty 250

## 2021-05-03 MED ORDER — CHLORHEXIDINE GLUCONATE CLOTH 2 % EX PADS
6.0000 | MEDICATED_PAD | Freq: Every day | CUTANEOUS | Status: DC
  Administered 2021-05-03 – 2021-05-21 (×18): 6 via TOPICAL

## 2021-05-03 NOTE — Telephone Encounter (Signed)
This nurse called patient three times in regards to AWV. Called at 0955, 1000, and 1010. Message left to call back in order to reschedule.

## 2021-05-03 NOTE — Progress Notes (Signed)
ANTICOAGULATION CONSULT NOTE - Initial Consult  Pharmacy Consult for Heparin  Indication: DVT, renal infarct  No Known Allergies  Patient Measurements: Height: 5\' 1"  (154.9 cm) Weight: 65.7 kg (144 lb 12.8 oz) IBW/kg (Calculated) : 47.8  Vital Signs: Temp: 97.5 F (36.4 C) (11/03 0019) Temp Source: Oral (11/03 0019) BP: 112/79 (11/03 0042) Pulse Rate: 100 (11/03 0042)  Labs: Recent Labs    05/02/21 1945 05/03/21 0304  HGB 11.8*  --   HCT 32.2*  --   PLT 317  --   CREATININE 0.65  --   TROPONINIHS 28* 32*    Estimated Creatinine Clearance: 59.2 mL/min (by C-G formula based on SCr of 0.65 mg/dL).   Medical History: Past Medical History:  Diagnosis Date   Fatty liver    GERD (gastroesophageal reflux disease)    Hypertension    Osteomyelitis ([facility])    right third toe   Peripheral vascular disease ([place])    Ulcer 08/14/20     Assessment: 70 y/o F with chest pain found to have new onset DVT and renal infarct. Starting heparin. Hgb 11.8, renal function good, PTA meds reviewed.   Goal of Therapy:  Heparin level 0.3-0.7 units/ml Monitor platelets by anticoagulation protocol: Yes   Plan:  Heparin 4000 units BOLUS Start heparin drip at 1000 units/hr 0900 Heparin level Daily CBC/Heparin level Monitor for bleeding  Narda Bonds, PharmD, BCPS Clinical Pharmacist Phone: [phone]

## 2021-05-03 NOTE — Progress Notes (Signed)
PT Cancellation Note  Patient Details Name: [facility] MRN: [id number] DOB: July 24, 1950   Cancelled Treatment:    Reason Eval/Treat Not Completed: Medical issues which prohibited therapy Pt with new DVT and started on heparin on 11/3. Per protocol, must be on heparin for 24 hrs prior to initiation of therapy. Will hold until pt appropriate and follow up as schedule allows.   Reuel Derby, PT, DPT  Acute Rehabilitation Services  Pager: [phone] Office: [phone]    Rudean Hitt 05/03/2021, 9:02 AM

## 2021-05-03 NOTE — Progress Notes (Signed)
ANTICOAGULATION CONSULT NOTE   Pharmacy Consult for Heparin  Indication: DVT, renal infarct  No Known Allergies  Patient Measurements: Height: 5\' 1"  (154.9 cm) Weight: 65.7 kg (144 lb 12.8 oz) IBW/kg (Calculated) : 47.8  Vital Signs: Temp: 97.6 F (36.4 C) (11/03 1553) Temp Source: Oral (11/03 1553) BP: 110/75 (11/03 1553) Pulse Rate: 106 (11/03 1553)  Labs: Recent Labs    05/02/21 1945 05/03/21 0304 05/03/21 0850 05/03/21 1720  HGB 11.8*  --   --   --   HCT 32.2*  --   --   --   PLT 317  --   --   --   HEPARINUNFRC  --   --  >1.10* 0.34  CREATININE 0.65  --   --   --   TROPONINIHS 28* 32*  --   --     Estimated Creatinine Clearance: 59.2 mL/min (by C-G formula based on SCr of 0.65 mg/dL).   Medical History: Past Medical History:  Diagnosis Date   Fatty liver    GERD (gastroesophageal reflux disease)    Hypertension    Osteomyelitis ([facility])    right third toe   Peripheral vascular disease ([place])    Ulcer 08/14/20     Assessment: 70 y/o F with chest pain found to have new onset DVT and renal infarct. Starting heparin. Hgb 11.8, renal function good, PTA meds reviewed.    Goal of Therapy:  Heparin level 0.3-0.7 units/ml Monitor platelets by anticoagulation protocol: Yes   Plan:  Continue rate of 800 units/hr F/u with heparin level with AM labs to assure still therapeutic.  Vaughan Basta BS, PharmD, BCPS Clinical Pharmacist  05/03/2021 6:11 PM

## 2021-05-03 NOTE — ED Notes (Signed)
Received pt, ao x 4, NAD. Denies pain at the moment.

## 2021-05-03 NOTE — Procedures (Signed)
PROCEDURE SUMMARY:  Successful US guided right thoracentesis. Yielded 500 ml of clear yellow fluid. Pt tolerated procedure well. No immediate complications.  Specimen sent for labs. CXR ordered; results pending  EBL < 2 mL  Theresa Duty, NP 05/03/2021 3:17 PM

## 2021-05-03 NOTE — Progress Notes (Signed)
Lower extremity venous has been completed.   Preliminary results in CV Proc.   [facility] 05/03/2021 11:43 AM

## 2021-05-03 NOTE — ED Notes (Signed)
Pt not in room-- in IR per [employer]

## 2021-05-03 NOTE — Progress Notes (Signed)
ANTICOAGULATION CONSULT NOTE   Pharmacy Consult for Heparin  Indication: DVT, renal infarct  No Known Allergies  Patient Measurements: Height: 5\' 1"  (154.9 cm) Weight: 65.7 kg (144 lb 12.8 oz) IBW/kg (Calculated) : 47.8  Vital Signs: Temp: 97.5 F (36.4 C) (11/03 0019) Temp Source: Oral (11/03 0019) BP: 126/80 (11/03 0700) Pulse Rate: 101 (11/03 0700)  Labs: Recent Labs    05/02/21 1945 05/03/21 0304 05/03/21 0850  HGB 11.8*  --   --   HCT 32.2*  --   --   PLT 317  --   --   HEPARINUNFRC  --   --  >1.10*  CREATININE 0.65  --   --   TROPONINIHS 28* 32*  --      Estimated Creatinine Clearance: 59.2 mL/min (by C-G formula based on SCr of 0.65 mg/dL).   Medical History: Past Medical History:  Diagnosis Date   Fatty liver    GERD (gastroesophageal reflux disease)    Hypertension    Osteomyelitis (HCC)    right third toe   Peripheral vascular disease ([place])    Ulcer 08/14/20     Assessment: 70 y/o F with chest pain found to have new onset DVT and renal infarct. Starting heparin. Hgb 11.8, renal function good, PTA meds reviewed.   Initial heparin level supratherapeutic on 1000 units/hr, drawn appropriately from opposite arm  Goal of Therapy:  Heparin level 0.3-0.7 units/ml Monitor platelets by anticoagulation protocol: Yes   Plan:  Hold heparin gtt x 1h, restart gtt at reduced rate of 800 units/hr F/u 6 hour heparin level  Bertis Ruddy, PharmD Clinical Pharmacist ED Pharmacist Phone # [phone] 05/03/2021 9:29 AM

## 2021-05-03 NOTE — Progress Notes (Signed)
Patient arrived on the unit from the ER assessment completed see flow sheet, placed on tele ccmd notified. Patient oriented to room and staff bed in lowest position call bell within reach will continue to monitor.

## 2021-05-03 NOTE — ED Notes (Signed)
Pt tx to IR.

## 2021-05-03 NOTE — ED Provider Notes (Signed)
Patient signed out to me at shift change.  Here with right sided abdominal pain and also reported chest pain (per family).   CT shows right sided renal infarct and left common iliac DVT.  Radiologist recommends Echo to evaluate for shunt due to possible paradoxical embolism.    Case discussed with Dr. Myna Hidalgo, who will admit.  Patient started on heparin.  Looks like she had an echo at the end of last month.  Appreciate Dr. Myna Hidalgo for bringing patient in.   Montine Circle, PA-C 05/03/21 0114    Palumbo, April, MD 05/03/21 [phone]

## 2021-05-03 NOTE — H&P (Signed)
History and Physical    Debbie Barry MCN:[id number] DOB: 1951/05/04 DOA: 05/02/2021  PCP: Glendale Chard, MD   Patient coming from: SNF   Chief Complaint: Chest pain   HPI: Debbie Barry is a pleasant 70 y.o. female with medical history significant for hypertension, spinal stenosis, chronic back pain, general weakness and physical debility, protein calorie malnutrition, PUD, and history of iron overload, now presenting to the emergency department after an episode of chest discomfort and palpitations.  The patient reports that she was lying in bed at her SNF when she developed discomfort in the central chest with sensation that her heart was racing.  She has had similar symptoms multiple times in the past that have been self-limited.  She is reported to have heart rate in the 130s at the SNF during this episode, was given oral metoprolol and 2 doses of nitroglycerin, and the symptoms resolved.  She reports feeling short of breath during the episode but denies fevers, chills, or cough, and notes that the dyspnea resolved prior to arrival in the ED.  She denies any history of DVT or PE.  She reports being unable to get out of bed at her facility due to her ongoing general weakness.  She does do some activity in her bed with PT.  ED Course: Upon arrival to the ED, patient is found to be afebrile, saturating well on room air, and with heart rate in the upper 90s to low 100s.  EKG features sinus tachycardia with rate 101, T wave inversions, and QTC 540 ms.  Chemistry panel notable for sodium 132 and albumin 1.5.  CBC with mild normocytic anemia.  CTA chest negative for PE but notable for mild cardiomegaly and large bilateral pleural effusions.  CT of the abdomen and pelvis reveals DVT within the left common iliac vein and acute right renal infarction, as well as marked diffuse body wall subcutaneous edema.  Patient was started on IV heparin in the ED.  Review of Systems:  All other systems  reviewed and apart from HPI, are negative.  Past Medical History:  Diagnosis Date   Fatty liver    GERD (gastroesophageal reflux disease)    Hypertension    Osteomyelitis ([place])    right third toe   Peripheral vascular disease ([place])    Ulcer 08/14/20    Past Surgical History:  Procedure Laterality Date   AMPUTATION Right 08/08/2017   Procedure: AMPUTATION RIGHT 3RD TOE;  Surgeon: Newt Minion, MD;  Location: [facility];  Service: Orthopedics;  Laterality: Right;   BIOPSY  08/14/2020   Procedure: BIOPSY;  Surgeon: Clarene Essex, MD;  Location: [facility];  Service: Endoscopy;;   Bunionectomy Right 2006     Right   Bunionectony Left 2006      August   COLONOSCOPY N/A 01/21/2014   Procedure: COLONOSCOPY;  Surgeon: Beryle Beams, MD;  Location: [facility];  Service: Endoscopy;  Laterality: N/A;   ESOPHAGOGASTRODUODENOSCOPY (EGD) WITH PROPOFOL N/A 08/14/2020   Procedure: ESOPHAGOGASTRODUODENOSCOPY (EGD) WITH PROPOFOL;  Surgeon: Clarene Essex, MD;  Location: [facility];  Service: Endoscopy;  Laterality: N/A;   TUBAL LIGATION     WISDOM TOOTH EXTRACTION      Social History:   reports that she has never smoked. She has never used smokeless tobacco. She reports current alcohol use of about 5.0 standard drinks per week. She reports that she does not use drugs.  No Known Allergies  Family History  Problem Relation Age of Onset  Breast cancer Mother    Colon cancer Mother    Heart Problems Mother    Hypertension Mother    Multiple myeloma Mother    Arthritis Mother    Cancer Mother    Heart disease Mother    Hypertension Father    Heart disease Father    Multiple myeloma Father    Arthritis Father    Cancer Father      Prior to Admission medications   Medication Sig Start Date End Date Taking? Authorizing Provider  acetaminophen (TYLENOL) 325 MG tablet Take 650 mg by mouth every 8 (eight) hours as needed.    [provider]  bisacodyl (DULCOLAX) 10 MG suppository If  not relieved by MOM, give 10 mg Bisacodyl suppositiory rectally X 1 dose in 24 hours as needed    [provider]  Cholecalciferol (VITAMIN D3) 5000 units CAPS Take 5,000 Units by mouth daily.    [provider]  feeding supplement (ENSURE ENLIVE / ENSURE PLUS) LIQD Take 237 mLs by mouth 3 (three) times daily between meals. 04/04/21   Mariel Aloe, MD  HYDROcodone-acetaminophen (NORCO) 5-325 MG tablet Take 1 tablet by mouth every 12 (twelve) hours as needed for moderate pain. 05/02/21   Medina-Vargas, Monina C, NP  hydrOXYzine (VISTARIL) 25 MG capsule Take 1 capsule (25 mg total) by mouth 2 (two) times daily as needed. 04/26/21   Medina-Vargas, Monina C, NP  Magnesium Hydroxide (MILK OF MAGNESIA PO) Take by mouth. Constipation (1 of 4): If no BM in 3 days, give 30 cc Milk of Magnesium p.o. x 1 dose in 24 hours as needed    [provider]  Multiple Vitamin (MULTIVITAMIN WITH MINERALS) TABS tablet Take 1 tablet by mouth daily.    [provider]  NON FORMULARY DIET: NAS/HEART HEALTHY    [provider]  nystatin (MYCOSTATIN) 100000 UNIT/ML suspension SWISH 5 ML BY MOUTH AND SPIT OUT FOUR TIMES DAILY X 2 WEEKS FOR ORAL YEAST    [provider]  ondansetron (ZOFRAN ODT) 4 MG disintegrating tablet Take 1 tablet (4 mg total) by mouth every 8 (eight) hours as needed for nausea or vomiting. 04/04/21   Mariel Aloe, MD  pantoprazole (PROTONIX) 40 MG tablet Take 1 tablet (40 mg total) by mouth daily. 04/04/21   Mariel Aloe, MD  potassium chloride SA (KLOR-CON) 20 MEQ tablet Take 1 tablet (20 mEq total) by mouth 2 (two) times daily. 02/14/21   Magrinat, Virgie Dad, MD  sertraline (ZOLOFT) 25 MG tablet Take 1 tablet (25 mg total) by mouth daily. 02/01/21   Bary Castilla, NP  Sodium Phosphates (RA SALINE ENEMA RE) Place rectally. If not relieved by Biscodyl suppository, give disposable Saline Enema rectally X 1 dose/24 hrs as needed    [provider]  Tetrahydrozoline HCl (VISINE OP) Place 2 drops into both eyes daily as needed (for dry/irritated eyes).     [provider]    Physical Exam: Vitals:   05/02/21 2245 05/02/21 2315 05/03/21 0019 05/03/21 0042  BP: 104/77 114/80  112/79  Pulse: 100 100  100  Resp: _0 Temp:   (!) 97.5 F (36.4 C)   TempSrc:   Oral   SpO2: 100% 100%  100%  Weight:      Height:         Constitutional: NAD, calm  Eyes: PERTLA, lids and conjunctivae normal ENMT: Mucous membranes are moist. Posterior pharynx clear of any exudate or  lesions.   Neck: supple, no masses  Respiratory:  no wheezing, no crackles. No accessory muscle use.  Cardiovascular: S1 & S2 heard, regular rate and rhythm. Diffusely edematous.   Abdomen: No distension, no tenderness, soft. Bowel sounds active.  Musculoskeletal: no clubbing / cyanosis. No joint deformity upper and lower extremities.   Skin: no significant rashes noted. Bandage at lower LLE. Warm, dry, well-perfused. Neurologic: CN 2-12 grossly intact. Sensation intact. Moving all extremities. Alert and oriented.  Psychiatric: Pleasant. Cooperative.    Labs and Imaging on Admission: I have personally reviewed following labs and imaging studies  CBC: Recent Labs  Lab 05/02/21 1945  WBC 9.8  NEUTROABS 7.2  HGB 11.8*  HCT 32.2*  MCV 93.1  PLT 161   Basic Metabolic Panel: Recent Labs  Lab 05/02/21 1945  NA 132*  K 4.0  CL 103  CO2 21*  GLUCOSE 90  BUN 13  CREATININE 0.65  CALCIUM 7.9*   GFR: Estimated Creatinine Clearance: 59.2 mL/min (by C-G formula based on SCr of 0.65 mg/dL). Liver Function Tests: Recent Labs  Lab 05/02/21 1945  AST 25  ALT 17  ALKPHOS 128*  BILITOT 0.6  PROT 4.4*  ALBUMIN 1.5*   Recent Labs  Lab 05/02/21 1945  LIPASE 21   No results for input(s): AMMONIA in the last 168 hours. Coagulation Profile: No results for input(s): INR, PROTIME in the last 168 hours. Cardiac Enzymes: No results  for input(s): CKTOTAL, CKMB, CKMBINDEX, TROPONINI in the last 168 hours. BNP (last 3 results) No results for input(s): PROBNP in the last 8760 hours. HbA1C: No results for input(s): HGBA1C in the last 72 hours. CBG: No results for input(s): GLUCAP in the last 168 hours. Lipid Profile: No results for input(s): CHOL, HDL, LDLCALC, TRIG, CHOLHDL, LDLDIRECT in the last 72 hours. Thyroid Function Tests: No results for input(s): TSH, T4TOTAL, FREET4, T3FREE, THYROIDAB in the last 72 hours. Anemia Panel: No results for input(s): VITAMINB12, FOLATE, FERRITIN, TIBC, IRON, RETICCTPCT in the last 72 hours. Urine analysis:    Component Value Date/Time   COLORURINE AMBER (A) 05/02/2021 2100   APPEARANCEUR CLOUDY (A) 05/02/2021 2100   LABSPEC 1.026 05/02/2021 2100   PHURINE 6.0 05/02/2021 2100   GLUCOSEU NEGATIVE 05/02/2021 2100   HGBUR LARGE (A) 05/02/2021 2100   BILIRUBINUR NEGATIVE 05/02/2021 2100   BILIRUBINUR negative 03/30/2020 [place] 05/02/2021 2100   PROTEINUR 100 (A) 05/02/2021 2100   UROBILINOGEN 0.2 03/30/2020 1215   NITRITE NEGATIVE 05/02/2021 2100   LEUKOCYTESUR MODERATE (A) 05/02/2021 2100   Sepsis Labs: _0 (procalcitonin:4,lacticidven:4) ) Recent Results (from the past 240 hour(s))  Culture, blood (routine x 2)     Status: None   Collection Time: 04/23/21  9:16 PM   Specimen: BLOOD  Result Value Ref Range Status   Specimen Description BLOOD RIGHT HAND  Final   Special Requests   Final    BOTTLES DRAWN AEROBIC ONLY Blood Culture results may not be optimal due to an inadequate volume of blood received in culture bottles   Culture   Final    NO GROWTH 5 DAYS Performed at [facility], [place] [address], [place], [place] [address]    Report Status 04/28/2021 FINAL  Final  Culture, blood (routine x 2)     Status: None   Collection Time: 04/23/21  9:25 PM   Specimen: BLOOD  Result Value Ref Range Status   Specimen Description BLOOD RIGHT ARM   Final   Special Requests  Final    BOTTLES DRAWN AEROBIC AND ANAEROBIC Blood Culture adequate volume   Culture   Final    NO GROWTH 5 DAYS Performed at [facility], [place] [address], [place], [place] [address]    Report Status 04/28/2021 FINAL  Final     Radiological Exams on Admission: DG Chest 2 View  Result Date: 05/02/2021 CLINICAL DATA:  Shortness of breath EXAM: CHEST - 2 VIEW COMPARISON:  08/12/2020 FINDINGS: Bandlike opacity in the right mid lung, likely atelectasis. Moderate retrocardiac left basilar opacity. Mild cardiomegaly. IMPRESSION: Right mid lung atelectasis. Left basilar opacities may indicate atelectasis or infection. Electronically Signed   By: Ulyses Jarred M.D.   On: 05/02/2021 20:48   CT Angio Chest PE W/Cm &/Or Wo Cm  Result Date: 05/03/2021 CLINICAL DATA:  Chest pain, right lower quadrant abdominal pain, urinary tract infection EXAM: CT ANGIOGRAPHY CHEST CT ABDOMEN AND PELVIS WITH CONTRAST TECHNIQUE: Multidetector CT imaging of the chest was performed using the standard protocol during bolus administration of intravenous contrast. Multiplanar CT image reconstructions and MIPs were obtained to evaluate the vascular anatomy. Multidetector CT imaging of the abdomen and pelvis was performed using the standard protocol during bolus administration of intravenous contrast. CONTRAST:  171m OMNIPAQUE IOHEXOL 300 MG/ML  SOLN COMPARISON:  None. FINDINGS: CTA CHEST FINDINGS Cardiovascular: Adequate opacification of the pulmonary arterial tree. No intraluminal filling defect identified to suggest acute pulmonary embolism. Central pulmonary arteries are of normal caliber. Mild coronary artery calcification. Cardiac size is mildly enlarged. No pericardial effusion. Mild atherosclerotic calcification within the thoracic aorta. No aortic aneurysm. Mediastinum/Nodes: No enlarged mediastinal, hilar, or axillary lymph nodes. Thyroid gland, trachea, and esophagus demonstrate no  significant findings. Lungs/Pleura: Moderate to large bilateral pleural effusions are present with compressive atelectasis of the lungs bilaterally with subtotal collapse of the left lower lobe and right middle lobe. No superimposed confluent pulmonary infiltrate. No pneumothorax. Central airways are patent. Musculoskeletal: Osseous structures are age-appropriate. No acute bone abnormality. No lytic or blastic bone lesion. Review of the MIP images confirms the above findings. CT ABDOMEN and PELVIS FINDINGS Hepatobiliary: No focal liver abnormality is seen. No gallstones, gallbladder wall thickening, or biliary dilatation. Pancreas: Unremarkable Spleen: Unremarkable Adrenals/Urinary Tract: The adrenal glands are unremarkable. There is a perfusion defect involving the upper pole of the right kidney in keeping with an acute renal infarct. The kidneys are otherwise unremarkable. Foley catheter balloon seen within a decompressed bladder lumen. Stomach/Bowel: Mild scattered colonic diverticulosis. Stomach, small bowel, and large bowel are otherwise unremarkable. Appendix normal. No free intraperitoneal gas. Mild ascites. Vascular/Lymphatic: Mild aortoiliac atherosclerotic calcification. No aortic aneurysm. A a filling defect is seen within the left internal iliac vein, axial image # 46/6, compatible with acute DVT. The abdominal vasculature is otherwise unremarkable. No pathologic adenopathy within the abdomen and pelvis. Reproductive: Uterus and bilateral adnexa are unremarkable. Other: There is marked diffuse body wall subcutaneous edema. No abdominal wall hernia. Musculoskeletal: . No lytic degenerative changes are seen within the lumbar spine. Age-indeterminate compression deformities of L3 and L4 are identified suspected chronic, however, given the lack of surrounding paravertebral edema or infiltration or blastic bone lesion. Review of the MIP images confirms the above findings. IMPRESSION: No pulmonary embolism.  Mild global cardiomegaly. Large bilateral pleural effusions, mild ascites, and marked diffuse body wall subcutaneous edema in keeping with anasarca, possibly related to cardiogenic failure. Acute right renal infarct. Infarct comprises less than 10% of the cortex of the right kidney. DVT within the  left common iliac vein. Given the presence of a systemic visceral infarct, echocardiographic correlation for an intracardiac shunt and potential for paradoxical embolization may be helpful. Mild colonic diverticulosis. Aortic Atherosclerosis (ICD10-I70.0). Electronically Signed   By: Fidela Salisbury M.D.   On: 05/03/2021 00:44   CT Abdomen Pelvis W Contrast  Result Date: 05/03/2021 CLINICAL DATA:  Chest pain, right lower quadrant abdominal pain, urinary tract infection EXAM: CT ANGIOGRAPHY CHEST CT ABDOMEN AND PELVIS WITH CONTRAST TECHNIQUE: Multidetector CT imaging of the chest was performed using the standard protocol during bolus administration of intravenous contrast. Multiplanar CT image reconstructions and MIPs were obtained to evaluate the vascular anatomy. Multidetector CT imaging of the abdomen and pelvis was performed using the standard protocol during bolus administration of intravenous contrast. CONTRAST:  160m OMNIPAQUE IOHEXOL 300 MG/ML  SOLN COMPARISON:  None. FINDINGS: CTA CHEST FINDINGS Cardiovascular: Adequate opacification of the pulmonary arterial tree. No intraluminal filling defect identified to suggest acute pulmonary embolism. Central pulmonary arteries are of normal caliber. Mild coronary artery calcification. Cardiac size is mildly enlarged. No pericardial effusion. Mild atherosclerotic calcification within the thoracic aorta. No aortic aneurysm. Mediastinum/Nodes: No enlarged mediastinal, hilar, or axillary lymph nodes. Thyroid gland, trachea, and esophagus demonstrate no significant findings. Lungs/Pleura: Moderate to large bilateral pleural effusions are present with compressive atelectasis  of the lungs bilaterally with subtotal collapse of the left lower lobe and right middle lobe. No superimposed confluent pulmonary infiltrate. No pneumothorax. Central airways are patent. Musculoskeletal: Osseous structures are age-appropriate. No acute bone abnormality. No lytic or blastic bone lesion. Review of the MIP images confirms the above findings. CT ABDOMEN and PELVIS FINDINGS Hepatobiliary: No focal liver abnormality is seen. No gallstones, gallbladder wall thickening, or biliary dilatation. Pancreas: Unremarkable Spleen: Unremarkable Adrenals/Urinary Tract: The adrenal glands are unremarkable. There is a perfusion defect involving the upper pole of the right kidney in keeping with an acute renal infarct. The kidneys are otherwise unremarkable. Foley catheter balloon seen within a decompressed bladder lumen. Stomach/Bowel: Mild scattered colonic diverticulosis. Stomach, small bowel, and large bowel are otherwise unremarkable. Appendix normal. No free intraperitoneal gas. Mild ascites. Vascular/Lymphatic: Mild aortoiliac atherosclerotic calcification. No aortic aneurysm. A a filling defect is seen within the left internal iliac vein, axial image # 46/6, compatible with acute DVT. The abdominal vasculature is otherwise unremarkable. No pathologic adenopathy within the abdomen and pelvis. Reproductive: Uterus and bilateral adnexa are unremarkable. Other: There is marked diffuse body wall subcutaneous edema. No abdominal wall hernia. Musculoskeletal: . No lytic degenerative changes are seen within the lumbar spine. Age-indeterminate compression deformities of L3 and L4 are identified suspected chronic, however, given the lack of surrounding paravertebral edema or infiltration or blastic bone lesion. Review of the MIP images confirms the above findings. IMPRESSION: No pulmonary embolism. Mild global cardiomegaly. Large bilateral pleural effusions, mild ascites, and marked diffuse body wall subcutaneous edema in  keeping with anasarca, possibly related to cardiogenic failure. Acute right renal infarct. Infarct comprises less than 10% of the cortex of the right kidney. DVT within the left common iliac vein. Given the presence of a systemic visceral infarct, echocardiographic correlation for an intracardiac shunt and potential for paradoxical embolization may be helpful. Mild colonic diverticulosis. Aortic Atherosclerosis (ICD10-I70.0). Electronically Signed   By: AFidela SalisburyM.D.   On: 05/03/2021 00:44    EKG: Independently reviewed. Sinus tachycardia, rate 101, T-wave inversions, QTc 540 ms.   Assessment/Plan   1. DVT  - DVT involving left common iliac vein noted on CT  in ED  - No PE on CTA chest and no evidence for phlegmasia - Unclear precipitant, possibly related to immobility and recent steroids   - IV heparin was started in ED  - She had UGIB in February with non-bleeding gastric ulcer on EGD, denies any bleeding since  - Continue IV heparin for now with close monitoring, transition to oral anticoagulant    2. Renal infarct  - Acute right renal infarct noted on CT in ED  - No known hx of a fib, reported to have HR 130s at SNF pta but EKG not visible - There is DVT involving left common iliac vein on CT raising question of intracardiac shunt; no shunt by color flow doppler on TTE from 04/21/21  - She was started on IV heparin in ED, may need hypercoagulable workup   3. Chest pain  - Presents with chest discomfort and palpitations that developed at rest and resolved with NTG x2 and metoprolol  - She had HR 130s at SNF but that EKG not available  - EKG in ED features SR with rate 101 and T-wave inversions  - HS troponin is 28 in ED  - CTA negative for PE but notable for large bilateral pleural effusions  - Possibly related to the tachyarrhythmia but second troponin not yet resulted  - Check second troponin, continue cardiac monitoring   4. Anasarca; protein-calorie malnutrition; acute on  chronic diastolic CHF  - Presents for evaluation of chest discomfort and palpitations and is found to have anasarca  - EF was 45-50% with grade 1 diastolic dysfunction on recent echo  - She has protein-calorie malnutrition and albumin is only 1.5 on admission  - Check BNP, diurese with IV Lasix, monitor weight and I/Os, monitor renal function and electrolytes    5. Pleural effusions  - Large bilateral pleural effusions noted on CT in ED in setting of anasarca  - She is speaking full sentences and saturating upper 90s-100% on rm air in ED  - Consider thoracentesis if not improved with diuresis   6. Spinal stenosis; chronic back pain  - No pain complaints on admission and no acute neurologic changes  - Continue supportive care, outpatient neurosurgery follow-up    7. Hyponatremia  - Serum sodium is 132 in setting of hypervolemia  - Monitor closely while diuresing    8. Prolonged QT interval  - QTc 540 ms on admission  - Check magnesium level, minimize QT-prolonging medications, continue cardiac monitoring for now   9. Debility  - Patient with general weakness recently discharged from hospital to SNF  - Continue PT    DVT prophylaxis: IV heparin  Code Status: Full  Level of Care: Level of care: Telemetry Cardiac Family Communication: Husband updated from ED  Disposition Plan:  Patient is from: SNF  Anticipated d/c is to: SNF  Anticipated d/c date is: 11/4 or 05/05/21 Patient currently: pending tolerance of anticoagulation and transition to oral anticoagulant  Consults called: none  Admission status: Observation     Vianne Bulls, MD [facility]  05/03/2021, 1:44 AM

## 2021-05-03 NOTE — Progress Notes (Signed)
PROGRESS NOTE  Debbie Barry  DOB: Aug 04, 1950  PCP: Glendale Chard, MD XBM:[id number]  DOA: 05/02/2021  LOS: 0 days  Hospital Day: 2  Chief Complaint  Patient presents with   Chest Pain   Abdominal Pain    Brief narrative: Debbie Barry is a 70 y.o. female with PMH significant for HTN, PAD, fatty liver, chronic back pain, spinal stenosis, generalized weakness, debility, protein calorie malnutrition, PVD, history of iron overload who was recently hospitalized from 10/20-10/26/2023 for progressive weakness, falls, found to have severe spinal stenosis with compression of L4-L5.  She was seen by neurosurgery, recommended medical management and discharged to SNF on a tapering course of dexamethasone and LCO brace.  Patient states that at SNF, she was not much active and was requesting a discharge to home.  On 11/2, patient was sent to the ED from SNF with complaint of chest discomfort and palpitation that she noticed while at rest.  Staff noted her to have a heart rate of 130 and sent her to ED.  In the ED, she was afebrile, saturating well on room air, heart rate in low 100s. EKG with heart rate of 101, T wave inversion in QTC 540 ms. Labs with mild normocytic anemia, sodium 132, albumin low at 1.5 CTA chest negative for PE but notable for mild cardiomegaly and large bilateral pleural effusions.   CT of the abdomen and pelvis showed DVT within the left common iliac vein and acute right renal infarction, as well as marked diffuse body wall subcutaneous edema.  Patient was started on IV heparin in the ED. Admitted to hospitalist service for further evaluation management   Subjective: Patient was seen and examined this morning in the ED. Pleasant elderly African-American female.  Propped up in bed.  Not in distress.  Not on supplemental oxygen.  Heart rate in low 100s.  Blood pressure in 100s.  On heparin drip. Chart reviewed  Assessment/Plan: Acute DVT -CT abdomen and pelvis  noted acute DVT involving left common iliac vein.  No evidence of PE on CTA chest. -Risk factors include chronic immobility, recent steroid use. -IV heparin drip was initiated in the ED. -Obtain DVT scan of both lower extremities.  Acute right renal infarct  -CT abdomen showed acute right renal infarct with a known history of A. fib and a new finding of intra-abdominal DVT.  -Echo from 2 weeks ago did not show any evidence of intracardiac shunt -Currently on heparin drip.  Chest pain/palpitation -Patient initially had complaint of chest pain and palpitation at rest and was noted to have heart rate of 130.  At the facility, it resolved with metoprolol and sublingual nitroglycerin.  By the time of presentation to the ED, patient's symptoms had subsided. -Only mildly elevated troponin. Recent Labs    05/02/21 1945 05/03/21 0304  TROPONINIHS 28* 32*   Acute on chronic combined systolic and diastolic CHF -Echo from 02/72 with EF 45 to 53%, grade 1 diastolic dysfunction. -Presented with shortness of breath, anasarca, bilateral pleural effusion elevated BNP. -On admission, she was started on Lasix IV 20 mg twice daily.  Continue same for now. -Net IO Since Admission: 210.05 mL [05/03/21 1319] -Continue to monitor for daily intake output, weight, blood pressure, BNP, renal function and electrolytes. Recent Labs  Lab 05/02/21 1945 05/03/21 0304  BNP  --  1,679.7*  BUN 13  --   CREATININE 0.65  --   K 4.0  --   MG  --  1.2*  Hypoalbuminemia Anasarca protein-calorie malnutrition -Dietitian consult.  Bilateral pleural effusion -Large bilateral pleural effusion noted in CT chest. -Order for thoracentesis and obtain fluid analysis including cytology.  Chronic gastritis -Patient has a history of nonbleeding gastric ulcers and chronic gastritis, had EGD done in February 2022.  Continue Protonix.  Hyponatremia  -Probably related to hypervolemia.  Continue to monitor. Recent Labs   Lab 05/02/21 1945  NA 132*   Prolonged QT interval Hypomagnesemia -Magnesium level low at 1.2.  QTc 540 ms on admission  -Magnesium replacement given.  Continue to monitor QTC Recent Labs  Lab 05/02/21 1945 05/03/21 0304  K 4.0  --   MG  --  1.2*   Spinal stenosis; chronic back pain; chronic impaired mobility- No pain complaints on admission and no acute neurologic changes  -Continue supportive care, outpatient neurosurgery follow-up   -PT eval.   Mobility: Discharged to SNF from last hospitalization where patient states he was not moving much.  Repeat PT eval Living condition: Previously living at home with her husband prior to previous hospitalization Goals of care:   Code Status: Full Code  Nutritional status: Body mass index is 27.36 kg/m.     Diet:  Diet Order             Diet Heart Room service appropriate? Yes; Fluid consistency: Thin  Diet effective now                  DVT prophylaxis: Heparin drip, SCDsPlace and maintain sequential compression device Start: 05/03/21 1316   Antimicrobials: None Fluid: None Consultants: None Family Communication: None at bedside  Status is: Observation  Remains inpatient appropriate because: Needs further diuresis, heparin drip, hemoglobin monitoring  Dispo: The patient is from: [facility] SNF              Anticipated d/c is to: Patient states he wants to go home              Patient currently is not medically stable to d/c.   Difficult to place patient No     Infusions:   sodium chloride     heparin Stopped (05/03/21 1017)    Scheduled Meds:  furosemide  20 mg Intravenous BID   pantoprazole  40 mg Oral Daily   sodium chloride flush  3 mL Intravenous Q12H    PRN meds: sodium chloride, acetaminophen, sodium chloride flush   Antimicrobials: Anti-infectives (From admission, onward)    None       Objective: Vitals:   05/03/21 1100 05/03/21 1147  BP: 106/70 (!) 90/53  Pulse: (!) 101 60  Resp: 15  16  Temp:  99.1 F (37.3 C)  SpO2: 100% 100%    Intake/Output Summary (Last 24 hours) at 05/03/2021 1319 Last data filed at 05/03/2021 1106 Gross per 24 hour  Intake 210.05 ml  Output --  Net 210.05 ml   Filed Weights   05/02/21 1857  Weight: 65.7 kg   Weight change:  Body mass index is 27.36 kg/m.   Physical Exam: General exam: Pleasant elderly African-American female.  Anasarca present.  Not in physical distress Skin: No rashes, lesions or ulcers. HEENT: Atraumatic, normocephalic, no obvious bleeding Lungs: Clear to auscultation bilaterally CVS: Regular rate and rhythm, no murmur GI/Abd soft, nontender, nondistended, bowel sound present CNS: Alert, awake, oriented x3 Psychiatry: Mood appropriate Extremities: Bilateral lower extremity and upper extremity edema.  No calf tenderness  Data Review: I have personally reviewed the laboratory data and studies available.  F/u  labs ordered [employer] (From admission, onward)     Start     Ordered   05/04/21 0518  Basic metabolic panel  Daily,   R      05/03/21 0143   05/04/21 0500  Heparin level (unfractionated)  Daily,   R     See Hyperspace for full Linked Orders Report.   05/03/21 0935   05/04/21 0500  CBC  Daily,   R     See Hyperspace for full Linked Orders Report.   05/03/21 0935   05/03/21 1700  Heparin level (unfractionated)  Once-Timed,   TIMED        05/03/21 0934   05/02/21 1946  Urine Culture  Once,   STAT       Question:  Indication  Answer:  Suprapubic pain   05/02/21 1946            Signed, Terrilee Croak, MD [facility] 05/03/2021

## 2021-05-04 ENCOUNTER — Other Ambulatory Visit (HOSPITAL_COMMUNITY): Payer: Self-pay

## 2021-05-04 DIAGNOSIS — I824Y2 Acute embolism and thrombosis of unspecified deep veins of left proximal lower extremity: Secondary | ICD-10-CM | POA: Diagnosis not present

## 2021-05-04 LAB — BASIC METABOLIC PANEL
Anion gap: 8 (ref 5–15)
BUN: 8 mg/dL (ref 8–23)
CO2: 27 mmol/L (ref 22–32)
Calcium: 7.6 mg/dL — ABNORMAL LOW (ref 8.9–10.3)
Chloride: 100 mmol/L (ref 98–111)
Creatinine, Ser: 0.63 mg/dL (ref 0.44–1.00)
GFR, Estimated: >60 mL/min (ref >60)
Glucose, Bld: 98 mg/dL (ref 70–99)
Potassium: 2.7 mmol/L — CL (ref 3.5–5.1)
Sodium: 135 mmol/L (ref 135–145)

## 2021-05-04 LAB — CBC
HCT: 32.7 % — ABNORMAL LOW (ref 36.0–46.0)
Hemoglobin: 12.2 g/dL (ref 12.0–15.0)
MCH: 34.1 pg — ABNORMAL HIGH (ref 26.0–34.0)
MCHC: 37.3 g/dL — ABNORMAL HIGH (ref 30.0–36.0)
MCV: 91.3 fL (ref 80.0–100.0)
Platelets: 304 10*3/uL (ref 150–400)
RBC: 3.58 MIL/uL — ABNORMAL LOW (ref 3.87–5.11)
RDW: 15.6 % — ABNORMAL HIGH (ref 11.5–15.5)
WBC: 8.6 10*3/uL (ref 4.0–10.5)
nRBC: 0 % (ref 0.0–0.2)

## 2021-05-04 LAB — ACID FAST SMEAR (AFB, MYCOBACTERIA): Acid Fast Smear: NEGATIVE

## 2021-05-04 LAB — HEPARIN LEVEL (UNFRACTIONATED): Heparin Unfractionated: 0.37 IU/mL (ref 0.30–0.70)

## 2021-05-04 LAB — MISC LABCORP TEST (SEND OUT): Labcorp test code: 9985

## 2021-05-04 LAB — CYTOLOGY - NON PAP

## 2021-05-04 LAB — MAGNESIUM: Magnesium: 1.6 mg/dL — ABNORMAL LOW (ref 1.7–2.4)

## 2021-05-04 MED ORDER — HYDROCODONE-ACETAMINOPHEN 5-325 MG PO TABS
1.0000 | ORAL_TABLET | Freq: Two times a day (BID) | ORAL | Status: DC | PRN
  Administered 2021-05-04 – 2021-05-14 (×8): 1 via ORAL
  Filled 2021-05-04 (×8): qty 1

## 2021-05-04 MED ORDER — APIXABAN 5 MG PO TABS: 5.0000 mg | ORAL_TABLET | Freq: Two times a day (BID) | ORAL | Status: DC

## 2021-05-04 MED ORDER — POTASSIUM CHLORIDE CRYS ER 20 MEQ PO TBCR
40.0000 meq | EXTENDED_RELEASE_TABLET | Freq: Once | ORAL | Status: AC
  Administered 2021-05-04: 40 meq via ORAL
  Filled 2021-05-04: qty 2

## 2021-05-04 MED ORDER — PROCHLORPERAZINE EDISYLATE 10 MG/2ML IJ SOLN: 5.0000 mg | Freq: Four times a day (QID) | INTRAMUSCULAR | Status: DC | PRN

## 2021-05-04 MED ORDER — LORAZEPAM 0.5 MG PO TABS: 0.5000 mg | ORAL_TABLET | Freq: Four times a day (QID) | ORAL | Status: DC | PRN

## 2021-05-04 MED ORDER — HYDROXYZINE PAMOATE 25 MG PO CAPS: 25.0000 mg | ORAL_CAPSULE | Freq: Two times a day (BID) | ORAL | Status: DC | PRN

## 2021-05-04 MED ORDER — POTASSIUM CHLORIDE CRYS ER 20 MEQ PO TBCR: 40.0000 meq | EXTENDED_RELEASE_TABLET | ORAL | Status: DC

## 2021-05-04 MED ORDER — APIXABAN 5 MG PO TABS
10.0000 mg | ORAL_TABLET | Freq: Once | ORAL | Status: AC
  Administered 2021-05-04: 10 mg via ORAL
  Filled 2021-05-04: qty 2

## 2021-05-04 MED ORDER — BOOST / RESOURCE BREEZE PO LIQD CUSTOM
1.0000 | Freq: Three times a day (TID) | ORAL | Status: DC
  Administered 2021-05-04 – 2021-05-08 (×11): 1 via ORAL

## 2021-05-04 MED ORDER — ADULT MULTIVITAMIN W/MINERALS CH
1.0000 | ORAL_TABLET | Freq: Every day | ORAL | Status: DC
  Administered 2021-05-04 – 2021-05-09 (×6): 1 via ORAL
  Filled 2021-05-04 (×6): qty 1

## 2021-05-04 MED ORDER — POTASSIUM CHLORIDE 20 MEQ PO PACK
40.0000 meq | PACK | ORAL | Status: AC
  Administered 2021-05-04 (×2): 40 meq via ORAL
  Filled 2021-05-04 (×2): qty 2

## 2021-05-04 MED ORDER — APIXABAN 5 MG PO TABS
10.0000 mg | ORAL_TABLET | Freq: Two times a day (BID) | ORAL | Status: DC
  Administered 2021-05-04 – 2021-05-09 (×10): 10 mg via ORAL
  Filled 2021-05-04 (×10): qty 2

## 2021-05-04 NOTE — Progress Notes (Addendum)
   05/04/21 0349  Notify: Provider  Provider Name/Title Howeter MD  Date Provider Notified 05/04/21  Time Provider Notified [phone]  Notification Type Page  Notification Reason Critical result (pottsaium level 2.7)   Prolonged QTC  604. MD notified, no sign of distress noted, vital signs remain stable.

## 2021-05-04 NOTE — Discharge Instructions (Addendum)
Information on my medicine - ELIQUIS (apixaban)  Why was Eliquis prescribed for you? Eliquis was prescribed to treat blood clots that may have been found in the veins of your legs (deep vein thrombosis) or in your lungs (pulmonary embolism) and to reduce the risk of them occurring again.  What do You need to know about Eliquis ? The starting dose is 10 mg (two 5 mg tablets) taken TWICE daily for the FIRST SEVEN (7) DAYS, then on 05/22/2021  the dose is reduced to ONE 5 mg tablet taken TWICE daily.  Eliquis may be taken with or without food.   Try to take the dose about the same time in the morning and in the evening. If you have difficulty swallowing the tablet whole please discuss with your pharmacist how to take the medication safely.  Take Eliquis exactly as prescribed and DO NOT stop taking Eliquis without talking to the doctor who prescribed the medication.  Stopping may increase your risk of developing a new blood clot.  Refill your prescription before you run out.  After discharge, you should have regular check-up appointments with your healthcare provider that is prescribing your Eliquis.    What do you do if you miss a dose? If a dose of ELIQUIS is not taken at the scheduled time, take it as soon as possible on the same day and twice-daily administration should be resumed. The dose should not be doubled to make up for a missed dose.  Important Safety Information A possible side effect of Eliquis is bleeding. You should call your healthcare provider right away if you experience any of the following: Bleeding from an injury or your nose that does not stop. Unusual colored urine (red or dark brown) or unusual colored stools (red or black). Unusual bruising for unknown reasons. A serious fall or if you hit your head (even if there is no bleeding).  Some medicines may interact with Eliquis and might increase your risk of bleeding or clotting while on Eliquis. To help avoid  this, consult your healthcare provider or pharmacist prior to using any new prescription or non-prescription medications, including herbals, vitamins, non-steroidal anti-inflammatory drugs (NSAIDs) and supplements.  This website has more information on Eliquis (apixaban): http://www.eliquis.com/eliquis/home  ========================================  Deep Vein Thrombosis    Deep vein thrombosis (DVT) is a condition in which a blood clot forms in a deep vein, such as a lower leg, thigh, or arm vein. A clot is blood that has thickened into a gel or solid. This condition is dangerous. It can lead to serious and even life-threatening complications if the clot travels to the lungs and causes a blockage (pulmonary embolism). It can also damage veins in the leg. This can result in leg pain, swelling, discoloration, and sores (post-thrombotic syndrome).  What are the causes? This condition may be caused by: A slowdown of blood flow. Damage to a vein. A condition that causes blood to clot more easily, such as an inherited clotting disorder.  What increases the risk? The following factors may make you more likely to develop this condition: Being overweight. Being older, especially over age 58. Sitting or lying down for more than four hours. Being in the hospital. Lack of physical activity (sedentary lifestyle). Pregnancy, being in childbirth, or having recently given birth. Taking medicines that contain estrogen, such as medicines to prevent pregnancy. Smoking. A history of any of the following: Blood clots or a blood clotting disease. Peripheral vascular disease. Inflammatory bowel disease. Cancer. Heart disease. Genetic  conditions that affect how your blood clots, such as Factor V Leiden mutation. Neurological diseases that affect your legs (leg paresis). A recent injury, such as a car accident. Major or lengthy surgery. A central line placed inside a large vein.  What are the signs or  symptoms? Symptoms of this condition include: Swelling, pain, or tenderness in an arm or leg. Warmth, redness, or discoloration in an arm or leg. If the clot is in your leg, symptoms may be more noticeable or worse when you stand or walk. Some people may not develop any symptoms.  How is this diagnosed? This condition is diagnosed with: A medical history and physical exam. Tests, such as: Blood tests. These are done to check how well your blood clots. Ultrasound. This is done to check for clots. Venogram. For this test, contrast dye is injected into a vein and X-rays are taken to check for any clots  How is this treated? Treatment for this condition depends on: The cause of your DVT. Your risk for bleeding or developing more clots. Any other medical conditions that you have. Treatment may include: Taking a blood thinner (anticoagulant). This type of medicine prevents clots from forming. It may be taken by mouth, injected under the skin, or injected through an IV (catheter). Injecting clot-dissolving medicines into the affected vein (catheter-directed thrombolysis). Having surgery. Surgery may be done to: Remove the clot. Place a filter in a large vein to catch blood clots before they reach the lungs. Some treatments may be continued for up to six months.  Follow these instructions at home: If you are taking blood thinners: Take the medicine exactly as told by your health care provider. Some blood thinners need to be taken at the same time every day. Do not skip a dose. Talk with your health care provider before you take any medicines that contain aspirin or NSAIDs. These medicines increase your risk for dangerous bleeding. Ask your health care provider about foods and drugs that could change the way the medicine works (may interact). Avoid those things if your health care provider tells you to do so. Blood thinners can cause easy bruising and may make it difficult to stop bleeding.  Because of this: Be very careful when using knives, scissors, or other sharp objects. Use an electric razor instead of a blade. Avoid activities that could cause injury or bruising, and follow instructions about how to prevent falls. Wear a medical alert bracelet or carry a card that lists what medicines you take.  General instructions Take over-the-counter and prescription medicines only as told by your health care provider. Return to your normal activities as told by your health care provider. Ask your health care provider what activities are safe for you. Wear compression stockings if recommended by your health care provider. Keep all follow-up visits as told by your health care provider. This is important.  How is this prevented? To lower your risk of developing this condition again: For 30 or more minutes every day, do an activity that: Involves moving your arms and legs. Increases your heart rate. When traveling for longer than four hours: Exercise your arms and legs every hour. Drink plenty of water. Avoid drinking alcohol. Avoid sitting or lying for a long time without moving your legs. If you have surgery or you are hospitalized, ask about ways to prevent blood clots. These may include taking frequent walks or using anticoagulants. Stay at a healthy weight. If you are a woman who is older than age  2, avoid unnecessary use of medicines that contain estrogen, such as some birth control pills. Do not use any products that contain nicotine or tobacco, such as cigarettes and e-cigarettes. This is especially important if you take estrogen medicines. If you need help quitting, ask your health care provider.  Contact a health care provider if: You miss a dose of your blood thinner. Your menstrual period is heavier than usual. You have unusual bruising.  Get help right away if: You have: New or increased pain, swelling, or redness in an arm or leg. Numbness or tingling in an arm  or leg. Shortness of breath. Chest pain. A rapid or irregular heartbeat. A severe headache or confusion. A cut that will not stop bleeding. There is blood in your vomit, stool, or urine. You have a serious fall or accident, or you hit your head. You feel light-headed or dizzy. You cough up blood.  These symptoms may represent a serious problem that is an emergency. Do not wait to see if the symptoms will go away. Get medical help right away. Call your local emergency services (911 in the [place] Do not drive yourself to the hospital. Summary Deep vein thrombosis (DVT) is a condition in which a blood clot forms in a deep vein, such as a lower leg, thigh, or arm vein. Symptoms can include swelling, warmth, pain, and redness in your leg or arm. This condition may be treated with a blood thinner (anticoagulant medicine), medicine that is injected to dissolve blood clots,compression stockings, or surgery. If you are prescribed blood thinners, take them exactly as told. This information is not intended to replace advice given to you by your health care provider. Make sure you discuss any questions you have with your health care provider. Document Revised: 05/30/2017 Document Reviewed: 11/15/2016 Elsevier Patient Education  [place].

## 2021-05-04 NOTE — TOC Benefit Eligibility Note (Signed)
Patient Teacher, English as a foreign language completed.    The patient is currently admitted and upon discharge could be taking Eliquis 5 mg.  The current 30 day co-pay is, $45.00.   The patient is currently admitted and upon discharge could be taking Xarelto 20 mg.  The current 30 day co-pay is, $45.00.   The patient is insured through [place], [place] Patient Advocate Specialist [place] Patient Advocate Team Direct Number: [phone]  Fax: [phone]

## 2021-05-04 NOTE — Evaluation (Signed)
Physical Therapy Evaluation Patient Details Name: Debbie Barry MRN: [id number] DOB: 1950-10-01 Today's Date: 05/04/2021  History of Present Illness  Pt adm 11/2 with chest pain and found to have lt common iliac DVT. Pt also found to have acute rt renal infarct, acute on chronic diastolic heart failure and large bilateral pleural effusions. PMH - spinal stenosis, chronic back pain, obesity, chf, htn, pvd, pt with recent hospital admission with BLE weakness and found to have chronic L3-4 compression fx and severe L4-5 canal stenosis.  Clinical Impression  Pt presents to PT with dependencies in all mobility and unable to stand with 2 person max assist. Pt has not been able to functionally stand since early October. Pt does not want to return to SNF and plans to return home with husband. Pt says that there was a w/c delivered to her house yesterday. She will also need a hoyer lift for home and to go home via medical transport.        Recommendations for follow up therapy are one component of a multi-disciplinary discharge planning process, led by the attending physician.  Recommendations may be updated based on patient status, additional functional criteria and insurance authorization.  Follow Up Recommendations Home health PT (pt does not want to return to SNF)    Assistance Recommended at Discharge Frequent or constant Supervision/Assistance  Functional Status Assessment Patient has had a recent decline in their functional status and/or demonstrates limited ability to make significant improvements in function in a reasonable and predictable amount of time  Equipment Recommendations  Other (comment) (hoyer lift)    Recommendations for Other Services       Precautions / Restrictions Precautions Precautions: Fall Required Braces or Orthoses: Spinal Brace Spinal Brace: Lumbar corset Other Brace: Pt had a brace from last admission but not present in the room.      Mobility  Bed  Mobility Overal bed mobility: Needs Assistance Bed Mobility: Rolling;Sidelying to Sit;Sit to Sidelying Rolling: Min assist Sidelying to sit: Mod assist;+2 for physical assistance;HOB elevated     Sit to sidelying: Mod assist;+2 for physical assistance General bed mobility comments: Assist to bring legs off of bed, elevate trunk into sitting and bring hips to EOB. Assist to lower trunk and bring feet back up into bed    Transfers Overall transfer level: Needs assistance Equipment used: Ambulation equipment used               General transfer comment: Attempted sit to stand with +2 max assist and use of Stedy and pt unable to stand. Used bed pad under hips to provide lift and only able to clear buttocks 4-6 inches off of bed.    Ambulation/Gait                Stairs            Wheelchair Mobility    Modified Rankin (Stroke Patients Only)       Balance Overall balance assessment: Needs assistance Sitting-balance support: No upper extremity supported;Feet unsupported Sitting balance-Leahy Scale: Fair                                       Pertinent Vitals/Pain Pain Assessment: No/denies pain    Home Living Family/patient expects to be discharged to:: Private residence Living Arrangements: Spouse/significant other Available Help at Discharge: Family;Available 24 hours/day Type of Home: House Home Access: Level entry  Home Layout: One level Home Equipment: Conservation officer, nature (2 wheels);Toilet riser;Wheelchair - manual Additional Comments: Pt reports a w/c was delivered to her house yesterday.    Prior Function Prior Level of Function : Needs assist       Physical Assist : Mobility (physical) Mobility (physical): Bed mobility;Transfers   Mobility Comments: Since last hospitalization in October pt has been nonambulatory and unable to fully stand with 2 person assist. Pt requiring assist for bed mobility.       Hand Dominance    Dominant Hand: Right    Extremity/Trunk Assessment   Upper Extremity Assessment Upper Extremity Assessment: Defer to OT evaluation    Lower Extremity Assessment Lower Extremity Assessment: RLE deficits/detail;LLE deficits/detail RLE Deficits / Details: knee and ankle 3/5, hip 3-/5 LLE Deficits / Details: grossly 2+ to 3-/5    Cervical / Trunk Assessment Cervical / Trunk Assessment: Kyphotic Cervical / Trunk Exceptions: chronic L3-4 compression fx.  Communication   Communication: No difficulties  Cognition Arousal/Alertness: Awake/alert Behavior During Therapy: WFL for tasks assessed/performed Overall Cognitive Status: Within Functional Limits for tasks assessed                                          General Comments General comments (skin integrity, edema, etc.): VSS on RA    Exercises     Assessment/Plan    PT Assessment Patient needs continued PT services  PT Problem List Decreased strength;Decreased activity tolerance;Decreased balance;Decreased mobility;Decreased knowledge of use of DME;Decreased safety awareness;Obesity       PT Treatment Interventions DME instruction;Functional mobility training;Balance training;Patient/family education;Therapeutic activities;Therapeutic exercise;Wheelchair mobility training    PT Goals (Current goals can be found in the Care Plan section)  Acute Rehab PT Goals Patient Stated Goal: go home PT Goal Formulation: With patient Time For Goal Achievement: 05/18/21 Potential to Achieve Goals: Fair    Frequency Min 3X/week   Barriers to discharge        Co-evaluation               AM-PAC PT "6 Clicks" Mobility  Outcome Measure Help needed turning from your back to your side while in a flat bed without using bedrails?: A Little Help needed moving from lying on your back to sitting on the side of a flat bed without using bedrails?: Total Help needed moving to and from a bed to a chair (including a  wheelchair)?: Total Help needed standing up from a chair using your arms (e.g., wheelchair or bedside chair)?: Total Help needed to walk in hospital room?: Total Help needed climbing 3-5 steps with a railing? : Total 6 Click Score: 8    End of Session   Activity Tolerance: Patient tolerated treatment well Patient left: in bed;with call bell/phone within reach;with bed alarm set Nurse Communication: Mobility status;Need for lift equipment PT Visit Diagnosis: Difficulty in walking, not elsewhere classified (R26.2);Muscle weakness (generalized) (M62.81);History of falling (Z91.81)    Time: 6222-9798 PT Time Calculation (min) (ACUTE ONLY): 19 min   Charges:   PT Evaluation $PT Eval Moderate Complexity: [place] Pager [phone] Office [place] 05/04/2021, 10:47 AM

## 2021-05-04 NOTE — Progress Notes (Signed)
TRH night cross cover note:  I was notified of serum potassium level of 2.7 per this morning's labs, with corresponding creatinine 0.63.  Per chart review, this is in the context of documentation of acute on chronic systolic/diastolic heart failure for which the patient is undergoing IV diuresis via Lasix.  I have ordered potassium chloride 40 mill equivalents p.o. x1 dose now . serum magnesium level pending.     Babs Bertin, DO Hospitalist

## 2021-05-04 NOTE — Progress Notes (Signed)
PROGRESS NOTE  Debbie Barry  DOB: 1950/08/11  PCP: Glendale Chard, MD ZTI:[id number]  DOA: 05/02/2021  LOS: 1 day  Hospital Day: 3  Chief Complaint  Patient presents with   Chest Pain   Abdominal Pain    Brief narrative: Debbie Barry is a 70 y.o. female with PMH significant for HTN, PAD, fatty liver, chronic back pain, spinal stenosis, generalized weakness, debility, protein calorie malnutrition, PVD, history of iron overload who was recently hospitalized from 10/20-10/26/2023 for progressive weakness, falls, found to have severe spinal stenosis with compression of L4-L5.  She was seen by neurosurgery, recommended medical management and discharged to SNF on a tapering course of dexamethasone and LCO brace and a Foley catheter in place.  Patient states that at SNF, she was not much active and was requesting a discharge to home.  On 11/2, patient was sent to the ED from SNF with complaint of chest discomfort and palpitation that she noticed while at rest.  Staff noted her to have a heart rate of 130 and sent her to ED.  In the ED, she was afebrile, saturating well on room air, heart rate in low 100s. EKG with heart rate of 101, T wave inversion in QTC 540 ms. Labs with mild normocytic anemia, sodium 132, albumin low at 1.5 CTA chest negative for PE but notable for mild cardiomegaly and large bilateral pleural effusions.   CT of the abdomen and pelvis showed DVT within the left common iliac vein and acute right renal infarction, as well as marked diffuse body wall subcutaneous edema.  Patient was started on IV heparin in the ED. Admitted to hospitalist service for further evaluation management   Subjective: Patient was seen and examined this morning. Propped up in bed.  Not in distress. Hemodynamically stable.  Not on supplemental oxygen. Continues to have gross swelling of all extremities. On Lasix IV 20 mg twice daily.  Assessment/Plan: Acute DVT -CT abdomen and pelvis  noted acute DVT involving left common iliac vein.  No evidence of PE on CTA chest. -Ultrasound lower extremities noted age-indeterminate DVT of the right common femoral vein and right femoral vein. -Risk factors include chronic immobility, recent steroid use. -Currently on IV heparin.  We will switch to oral Eliquis today.  Acute right renal infarct  -CT abdomen showed acute right renal infarct with a known history of A. fib and a new finding of intra-abdominal DVT.  -Echo from 2 weeks ago did not show any evidence of intracardiac shunt -Anticoagulation initiated.  Chest pain/palpitation -Patient initially had complaint of chest pain and palpitation at rest and was noted to have heart rate of 130.  At the facility, it resolved with metoprolol and sublingual nitroglycerin.  By the time of presentation to the ED, patient's symptoms had subsided. -Only mildly elevated troponin.  No complaint of chest pain at this time. Recent Labs    05/02/21 1945 05/03/21 0304  TROPONINIHS 28* 32*    Acute on chronic combined systolic and diastolic CHF -Echo from 82/50 with EF 45 to 53%, grade 1 diastolic dysfunction. -Presented with shortness of breath, anasarca, bilateral pleural effusion elevated BNP. -On admission, she was started on Lasix IV 20 mg twice daily.  More than 2 L of urine output in last 24 hours.  Still has gross anasarca.  Continue Lasix IV 20 mg twice daily for now. -Net IO Since Admission: -2,426.75 mL [05/04/21 1313] -Continue to monitor for daily intake output, weight, blood pressure, BNP, renal function and  electrolytes. Recent Labs  Lab 05/02/21 1945 05/03/21 0304 05/04/21 0238  BNP  --  1,679.7*  --   BUN 13  --  8  CREATININE 0.65  --  0.63  K 4.0  --  2.7*  MG  --  1.2* 1.6*   Prolonged QT interval Severe hypokalemia/hypomagnesemia -Potassium level low at 2.7 and magnesium low at 1.6 today.  IV and oral replacement given. -QTc 540 ms on admission, worsened to 604 ms on  repeat EKG today.  Avoid QTC prolonging medications.  -Continue to monitor electrolytes and EKG.  Hypoalbuminemia Anasarca protein-calorie malnutrition -Dietitian consulted.  Bilateral pleural effusion -Large bilateral pleural effusion noted in CT chest. -Ultrasound thoracentesis of right side yielded 500 mL of fluid.  CSF analysis showed hazy fluid with slightly elevated LDH.  Cytology sent  Chronic gastritis -Patient has a history of nonbleeding gastric ulcers and chronic gastritis, had EGD done in February 2022.  Continue Protonix.  Spinal stenosis; chronic back pain; chronic impaired mobility -No pain complaints on admission and no acute neurologic changes  -Continue supportive care, outpatient neurosurgery follow-up   -PT eval.  Urinary retention -Patient has Foley catheter for last 2 weeks after recent surgery.  She is mostly bedbound and finds it comfortable to continue to have Foley catheter.  We have discussed about the risk of infection with long-term Foley catheter and she wants to continue it for now until she is able to move better.   Mobility: Limited mobility after last surgery Living condition: Previously living at home with her husband prior to previous hospitalization Goals of care:   Code Status: Full Code.  Discussed today.  Wants to remain full code Nutritional status: Body mass index is 27.12 kg/m.     Diet:  Diet Order             Diet Heart Room service appropriate? Yes; Fluid consistency: Thin  Diet effective now                  DVT prophylaxis:  SCDsPlace and maintain sequential compression device Start: 05/03/21 1316   Antimicrobials: None Fluid: None Consultants: None Family Communication: None at bedside  Status is: Observation  Remains inpatient appropriate because: Needs further diuresis  Dispo: The patient is from: [facility] SNF              Anticipated d/c is to: Patient states he wants to go home              Patient currently  is not medically stable to d/c.   Difficult to place patient No     Infusions:   sodium chloride     heparin 800 Units/hr (05/04/21 8119)    Scheduled Meds:  Chlorhexidine Gluconate Cloth  6 each Topical Daily   furosemide  20 mg Intravenous BID   pantoprazole  40 mg Oral Daily   potassium chloride  40 mEq Oral Q2H   sodium chloride flush  3 mL Intravenous Q12H    PRN meds: sodium chloride, acetaminophen, lidocaine (PF), LORazepam, sodium chloride flush   Antimicrobials: Anti-infectives (From admission, onward)    None       Objective: Vitals:   05/04/21 0820 05/04/21 1058  BP: 114/70 126/83  Pulse: 90 95  Resp: 11 10  Temp: (!) 97.3 F (36.3 C) (!) 97.3 F (36.3 C)  SpO2: 99% 100%    Intake/Output Summary (Last 24 hours) at 05/04/2021 1313 Last data filed at 05/04/2021 0457 Gross per 24 hour  Intake 243.2 ml  Output 2880 ml  Net -2636.8 ml    Filed Weights   05/02/21 1857 05/04/21 0400  Weight: 65.7 kg 65.1 kg   Weight change: -0.581 kg Body mass index is 27.12 kg/m.   Physical Exam: General exam: Pleasant elderly African-American female.  Anasarca present.  Not in physical distress Skin: No rashes, lesions or ulcers. HEENT: Atraumatic, normocephalic, no obvious bleeding Lungs: Clear to auscultation bilaterally CVS: Regular rate and rhythm, no murmur GI/Abd soft, nontender, nondistended, bowel sound present CNS: Alert, awake, oriented x3 Psychiatry: Mood appropriate Extremities: All 4 extremities swollen.  No calf tenderness  Data Review: I have personally reviewed the laboratory data and studies available.  F/u labs ordered Unresulted Labs (From admission, onward)     Start     Ordered   05/04/21 5056  Basic metabolic panel  Daily,   R      05/03/21 0143   05/04/21 0500  Heparin level (unfractionated)  Daily,   R     See Hyperspace for full Linked Orders Report.   05/03/21 0935   05/04/21 0500  CBC  Daily,   R     See Hyperspace for  full Linked Orders Report.   05/03/21 0935   05/03/21 1518  Acid Fast Culture with reflexed sensitivities  RELEASE UPON ORDERING,   STAT        05/03/21 1518   05/03/21 1518  Acid Fast Smear (AFB)  RELEASE UPON ORDERING,   STAT        05/03/21 1518            Signed, Terrilee Croak, MD [facility] 05/04/2021

## 2021-05-04 NOTE — Progress Notes (Addendum)
Initial Nutrition Assessment  DOCUMENTATION CODES:   Not applicable  INTERVENTION:   Boost Breeze po TID, each supplement provides 250 kcal and 9 grams of protein MVI with minerals daily  NUTRITION DIAGNOSIS:   Inadequate oral intake related to decreased appetite as evidenced by per patient/family report.  GOAL:   Patient will meet greater than or equal to 90% of their needs  MONITOR:   PO intake, Supplement acceptance  REASON FOR ASSESSMENT:   Consult Assessment of nutrition requirement/status  ASSESSMENT:   70 yo female admitted with DVT R common femoral vein and R femoral vein, acute on chronic CHF with anasarca. PMH includes HTN, PAD, fatty liver, chronic back pain, spinal stenosis, PVD, iron overload, gastritis, gastric ulcers.  Patient reports that she doesn't eat very much. Lunch tray at bedside, untouched. She denies weight loss, but anasarca could be masking actual weight loss. Current weight is stable from 11/2 (65.7 kg), but above weight from 10/27 (53.1 kg).  Patient requests Ensure clear supplement. She agreed to try Boost Breeze instead since Ensure clear is not on hospital formulary.   Labs reviewed. K 2.7, mag 1.6 Low albumin (1.5 on 11/2) reflects dilutional effect of anasarca.  Medications reviewed and include Lasix, Protonix.  NUTRITION - FOCUSED PHYSICAL EXAM:  Flowsheet Row Most Recent Value  Orbital Region No depletion  Upper Arm Region No depletion  Thoracic and Lumbar Region No depletion  Buccal Region No depletion  Temple Region No depletion  Clavicle Bone Region No depletion  Clavicle and Acromion Bone Region No depletion  Scapular Bone Region No depletion  Dorsal Hand No depletion  Patellar Region Unable to assess  Anterior Thigh Region Unable to assess  Posterior Calf Region Unable to assess  Edema (RD Assessment) Unable to assess  Hair Reviewed  Eyes Reviewed  Mouth Reviewed  Skin Reviewed  Nails Reviewed       Diet Order:    Diet Order             Diet Heart Room service appropriate? Yes; Fluid consistency: Thin  Diet effective now                   EDUCATION NEEDS:   No education needs have been identified at this time  Skin:  Skin Assessment: Skin Integrity Issues: Skin Integrity Issues:: Other (Comment) Other: pretibial venous stasis ulcer  Last BM:  11/3  Height:   Ht Readings from Last 1 Encounters:  05/02/21 5\' 1"  (1.549 m)    Weight:   Wt Readings from Last 1 Encounters:  05/04/21 65.1 kg    BMI:  Body mass index is 27.12 kg/m.  Estimated Nutritional Needs:   Kcal:  1400-1600  Protein:  70-80 gm  Fluid:  1.4-1.6 L    Lucas Mallow, RD, LDN, CNSC Please refer to Amion for contact information.

## 2021-05-04 NOTE — Progress Notes (Addendum)
ANTICOAGULATION CONSULT NOTE   Pharmacy Consult for Heparin  Indication: DVT, renal infarct  No Known Allergies  Patient Measurements: Height: 5\' 1"  (154.9 cm) Weight: 65.1 kg (143 lb 8.3 oz) IBW/kg (Calculated) : 47.8  Vital Signs: Temp: 97.4 F (36.3 C) (11/04 0323) Temp Source: Oral (11/04 0323) BP: 122/77 (11/04 0455) Pulse Rate: 101 (11/04 0455)  Labs: Recent Labs    05/02/21 1945 05/03/21 0304 05/03/21 0850 05/03/21 1720 05/04/21 0238  HGB 11.8*  --   --   --  12.2  HCT 32.2*  --   --   --  32.7*  PLT 317  --   --   --  304  HEPARINUNFRC  --   --  >1.10* 0.34 0.37  CREATININE 0.65  --   --   --  0.63  TROPONINIHS 28* 32*  --   --   --      Estimated Creatinine Clearance: 58.9 mL/min (by C-G formula based on SCr of 0.63 mg/dL).   Medical History: Past Medical History:  Diagnosis Date   Fatty liver    GERD (gastroesophageal reflux disease)    Hypertension    Osteomyelitis (HCC)    right third toe   Peripheral vascular disease ([place])    Ulcer 08/14/20     Assessment: 70 y/o F with chest pain found to have new onset DVT and renal infarct.   Heparin level this morning came back therapeutic at 0.37, on 800 units/hr. Hgb 12.2, plt 304. No s/sx of bleeding or infusion issues.   Goal of Therapy:  Heparin level 0.3-0.7 units/ml Monitor platelets by anticoagulation protocol: Yes   Plan:  Continue rate of 800 units/hr Monitor daily HL, CBC, and for s/sx of bleeding F/u plan for oral anticoagulation  Antonietta Jewel, PharmD, [place] Pharmacist  Phone: [phone] 05/04/2021 7:31 AM  Please check AMION for all [place] phone numbers After 10:00 PM, call [place] [phone]  ADDENDUM Will switch to apixaban for DVT and renal infarct - will start apixaban 10 mg BID for 7 days then 5 mg BID thereafter. Monitor s/sx of bleeding.   Antonietta Jewel, PharmD, [place] Clinical Pharmacist

## 2021-05-04 NOTE — Progress Notes (Signed)
TRH night cross cover note:  I was notified by RN of patient's request for resumption of her home as needed hydroxyzine for anxiety.  However, RN conveyed that most recent EKG was associated with QTC of 604 MS.  Consequently, will hold hydroxyzine as well as additional medications associated with QTC prolongation.  Instead, I have ordered as needed oral Ativan for anxiety.  Of note, serum magnesium level ordered, with result currently pending.     Babs Bertin, DO Hospitalist

## 2021-05-04 NOTE — TOC Initial Note (Addendum)
Transition of Care (TOC) - Initial/Assessment Note  Marvetta Gibbons RN, BSN Transitions of Care Unit 4E- RN Case Manager See Treatment Team for direct phone #    Patient Details  Name: Debbie Barry MRN: [id number] Date of Birth: 1950/09/27  Transition of Care [facility]) CM/SW Contact:    Dawayne Patricia, RN Phone Number: 05/04/2021, 2:57 PM  Clinical Narrative:                 Pt recently discharge to [facility] on 11/2- readmitted to hospital from [place]. Pt would like to return home with spouse and does not want to return to [place].  Per conversation with pt at bedside she confirms that they have had a w/c and BSC delivered to the home- PT also recommends hoyer lift. Pt is not sure agency that delivered DME and gives permission to call her spouse.  Discussed how pt will transport home- may need EMS transport, but will see how she does over the weekend. Explained to pt insurance may not cover expense if she is able to mobilize.   Call made to spouse- and confirmed DME agency that supplied w/c and BSC was [employer]- agreeable to use them as well for hoyer lift- MD order has been placed. Per conversation with spouse he also mentioned that [facility] told him about setting up [facility] for someone to come out 2-3 days a week but he does not know which agency SNF made the referral to- Discussed choice if unable to find out agency- spouse states he does not have a preference. CM will make some calls to see if we can determine which agency has referral.   Calls made to [place], [place], Max with [facility] has confirmed that they received the referral from [facility] for [facility] needs- however they did not receive any orders with the referral- pt will need [place] orders placed [facility] requesting orders for HHRN/PT/OT/SLP/aide/SW- will ask MD to place prior to discharge. [facility] will follow for transition home.   Call made to [employer] for DME- hoyer lift to be delivered to the home.    Expected Discharge Plan: [place] Barriers to Discharge: Continued Medical Work up   Patient Goals and CMS Choice Patient states their goals for this hospitalization and ongoing recovery are:: return home, does not want to go back to SNF CMS Medicare.gov Compare Post Acute Care list provided to:: Patient Choice offered to / list presented to : Patient, Spouse  Expected Discharge Plan and Services Expected Discharge Plan: [place]   Discharge Planning Services: CM Consult Post Acute Care Choice: Home Health, Durable Medical Equipment Living arrangements for the past 2 months: Single Family Home                 DME Arranged: Other see comment [place] [facility] lift) DME Agency: [facility] Date DME Agency Contacted: 05/04/21 Time DME Agency Contacted: 22 Representative spoke with at DME Agency: [place] Arranged: RN, PT, OT, Nurse's Aide, Speech Therapy, Social Work [facility] Agency: [facility] Date [place]: 05/04/21 Time [place]: [place] Representative spoke with at [place]: Levada Dy  Prior Living Arrangements/Services Living arrangements for the past 2 months: [place] with:: Spouse Patient language and need for interpreter reviewed:: Yes        Need for Family Participation in Patient Care: Yes (Comment) Care giver support system in place?: Yes (comment) Current home services: DME (W/C, BSC) Criminal Activity/Legal Involvement Pertinent  to Current Situation/Hospitalization: No - Comment as needed  Activities of Daily Living      Permission Sought/Granted Permission sought to share information with : Facility Sport and exercise psychologist, Family Supports Permission granted to share information with : Yes, Verbal Permission Granted  Share Information with NAME: Glynn Freas  Permission granted to share info w AGENCY: HH/DME  Permission granted to share info w Relationship: spouse  Permission granted to  share info w Contact Information: [phone]  Emotional Assessment Appearance:: Appears stated age Attitude/Demeanor/Rapport: Engaged Affect (typically observed): Pleasant Orientation: : Oriented to Self, Oriented to Place, Oriented to Situation, Oriented to  Time Alcohol / Substance Use: Not Applicable Psych Involvement: No (comment)  Admission diagnosis:  Dyspnea [R06.00] DVT (deep venous thrombosis) (HCC) [I82.409] S/P thoracentesis [Z98.890] Patient Active Problem List   Diagnosis Date Noted   DVT (deep venous thrombosis) ([place]) 05/03/2021   Renal infarct ([place]) 05/03/2021   Hyponatremia 05/03/2021   Chronic retention of urine 05/03/2021   Lumbar spinal stenosis 05/03/2021   Anasarca 05/03/2021   Bilateral pleural effusion 05/03/2021   Weakness of left lower extremity 04/20/2021   Asymptomatic bacteriuria 04/20/2021   Prolonged QT interval 04/20/2021   Protein-calorie malnutrition, severe 04/03/2021   Gastritis 03/31/2021   Nausea and vomiting 03/31/2021   Iron overload 02/13/2021   Neuropathy 02/13/2021   Elevated LFTs 02/13/2021   Weight loss, non-intentional 02/13/2021   Falls frequently 02/13/2021   GI bleed 08/13/2020   UGIB (upper gastrointestinal bleed) 08/12/2020   Depression 08/12/2020   Hypokalemia 08/12/2020   Essential hypertension, benign 03/23/2019   PCP:  Glendale Chard, MD Pharmacy:   [facility] [place], [place] [address] Phone: [phone] Fax: [place], [place] - [address] [place] [address] Phone: [phone] Fax: [phone]     Social Determinants of Health (SDOH) Interventions    Readmission Risk Interventions No flowsheet data found.

## 2021-05-05 DIAGNOSIS — I824Y2 Acute embolism and thrombosis of unspecified deep veins of left proximal lower extremity: Secondary | ICD-10-CM | POA: Diagnosis not present

## 2021-05-05 LAB — CBC
HCT: 29.9 % — ABNORMAL LOW (ref 36.0–46.0)
Hemoglobin: 10.7 g/dL — ABNORMAL LOW (ref 12.0–15.0)
MCH: 33.2 pg (ref 26.0–34.0)
MCHC: 35.8 g/dL (ref 30.0–36.0)
MCV: 92.9 fL (ref 80.0–100.0)
Platelets: 310 10*3/uL (ref 150–400)
RBC: 3.22 MIL/uL — ABNORMAL LOW (ref 3.87–5.11)
RDW: 15.4 % (ref 11.5–15.5)
WBC: 6.9 10*3/uL (ref 4.0–10.5)
nRBC: 0.3 % — ABNORMAL HIGH (ref 0.0–0.2)

## 2021-05-05 LAB — BASIC METABOLIC PANEL
Anion gap: 9 (ref 5–15)
BUN: 8 mg/dL (ref 8–23)
CO2: 23 mmol/L (ref 22–32)
Calcium: 7.3 mg/dL — ABNORMAL LOW (ref 8.9–10.3)
Chloride: 103 mmol/L (ref 98–111)
Creatinine, Ser: 0.55 mg/dL (ref 0.44–1.00)
GFR, Estimated: >60 mL/min (ref >60)
Glucose, Bld: 79 mg/dL (ref 70–99)
Potassium: 4.4 mmol/L (ref 3.5–5.1)
Sodium: 135 mmol/L (ref 135–145)

## 2021-05-05 MED ORDER — MELATONIN 3 MG PO TABS
3.0000 mg | ORAL_TABLET | Freq: Every evening | ORAL | Status: DC | PRN
  Administered 2021-05-05 – 2021-05-21 (×9): 3 mg via ORAL
  Filled 2021-05-05 (×9): qty 1

## 2021-05-05 MED ORDER — ALBUMIN HUMAN 5 % IV SOLN
12.5000 g | Freq: Once | INTRAVENOUS | Status: AC
  Administered 2021-05-05: 12.5 g via INTRAVENOUS
  Filled 2021-05-05: qty 250

## 2021-05-05 MED ORDER — FUROSEMIDE 10 MG/ML IJ SOLN
40.0000 mg | Freq: Two times a day (BID) | INTRAMUSCULAR | Status: DC
  Administered 2021-05-05 – 2021-05-06 (×4): 40 mg via INTRAVENOUS
  Filled 2021-05-05 (×4): qty 4

## 2021-05-05 NOTE — Progress Notes (Addendum)
PROGRESS NOTE  Debbie Barry  DOB: 01-Jun-1951  PCP: Glendale Chard, MD IRW:[id number]  DOA: 05/02/2021  LOS: 2 days  Hospital Day: 4  Chief Complaint  Patient presents with   Chest Pain   Abdominal Pain    Brief narrative: Debbie Barry is a 70 y.o. female with PMH significant for HTN, PAD, fatty liver, chronic back pain, spinal stenosis, generalized weakness, debility, protein calorie malnutrition, PVD, history of iron overload who was recently hospitalized from 10/20-10/26/2023 for progressive weakness, falls, found to have severe spinal stenosis with compression of L4-L5.  She was seen by neurosurgery, recommended medical management and discharged to SNF on a tapering course of dexamethasone and LCO brace and a Foley catheter in place.  Patient states that at SNF, she was not much active and was requesting a discharge to home.  On 11/2, patient was sent to the ED from SNF with complaint of chest discomfort and palpitation that she noticed while at rest.  Staff noted her to have a heart rate of 130 and sent her to ED.  In the ED, she was afebrile, saturating well on room air, heart rate in low 100s. EKG with heart rate of 101, T wave inversion in QTC 540 ms. Labs with mild normocytic anemia, sodium 132, albumin low at 1.5 CTA chest negative for PE but notable for mild cardiomegaly and large bilateral pleural effusions.   CT of the abdomen and pelvis showed DVT within the left common iliac vein and acute right renal infarction, as well as marked diffuse body wall subcutaneous edema.  Patient was started on IV heparin in the ED. Admitted to hospitalist service for further evaluation management   Subjective: Patient was seen and examined this morning. Lying on bed.  Not in distress.  Continues to have anasarca.  It is unclear if the documentation of urine output in last 24 hours is accurate.  Assessment/Plan: Acute DVT -CT abdomen and pelvis noted acute DVT involving left  common iliac vein.  No evidence of PE on CTA chest. -Ultrasound lower extremities noted age-indeterminate DVT of the right common femoral vein and right femoral vein. -Risk factors include chronic immobility, recent steroid use. -Initially patient was started on IV heparin and later switched to Eliquis.   Acute right renal infarct  -CT abdomen showed acute right renal infarct with a known history of A. fib and a new finding of intra-abdominal DVT.  On admission, patient had red tinge in urine with hemoglobin noted in urinalysis.  Urine seems to be clearing up. -Echo from 2 weeks ago did not show any evidence of intracardiac shunt -Anticoagulation initiated.  Acute on chronic combined systolic and diastolic CHF -Echo from 76/19 with EF 45 to 50%, grade 1 diastolic dysfunction. -Presented with shortness of breath, anasarca, bilateral pleural effusion elevated BNP. -She has been getting Lasix 20 mg IV twice daily and has made more than 2 L urine since admission but continues to have significant anasarca.  This morning, I bumped Lasix to 40 mg IV twice daily.  I will also give her dose of Albumin for a very low albumin level. -Net IO Since Admission: -2,266.75 mL [05/05/21 1024] -Continue to monitor for daily intake output, weight, blood pressure, BNP, renal function and electrolytes. Recent Labs  Lab 05/02/21 1945 05/03/21 0304 05/04/21 0238 05/05/21 0403  BNP  --  1,679.7*  --   --   BUN 13  --  8 8  CREATININE 0.65  --  0.63 0.55  K 4.0  --  2.7* 4.4  MG  --  1.2* 1.6*  --    Chest pain/palpitation -Patient initially had complaint of chest pain and palpitation at rest and was noted to have heart rate of 130.  At the facility, it resolved with metoprolol and sublingual nitroglycerin.  By the time of presentation to the ED, patient's symptoms had subsided. -Only mildly elevated troponin.  No complaint of chest pain since then.  Prolonged QT interval Severe  hypokalemia/hypomagnesemia -Potassium level improved after replacement.  Magnesium was replaced yesterday.  Repeat levels tomorrow.  Last EKG yesterday with QTC 604 ms.  Repeat EKG today.   Recent Labs  Lab 05/02/21 1945 05/03/21 0304 05/04/21 0238 05/05/21 0403  K 4.0  --  2.7* 4.4  MG  --  1.2* 1.6*  --    Hypoalbuminemia Anasarca protein-calorie malnutrition -Dietitian consulted.  Bilateral pleural effusion -Large bilateral pleural effusion noted in CT chest. -Ultrasound thoracentesis of right side yielded 500 mL of fluid.  CSF analysis showed hazy fluid with slightly elevated LDH.  Cytology did not show any malignant cells  Chronic gastritis -Patient has a history of nonbleeding gastric ulcers and chronic gastritis, had EGD done in February 2022.  Continue Protonix.  Spinal stenosis; chronic back pain; chronic impaired mobility -No pain complaints on admission and no acute neurologic changes  -Continue supportive care, outpatient neurosurgery follow-up   -PT eval obtained.  Patient needs significant assistance even on sitting up to the edge of the bed.  She however does not want to go to a facility and wants to go home.  Urinary retention -Patient has Foley catheter for last 2 weeks after recent surgery.  She is mostly bedbound and finds it comfortable to continue to have Foley catheter.  We have discussed about the risk of infection with long-term Foley catheter.  She agrees to get it removed today.   Mobility: Limited mobility after last surgery Living condition: Previously living at home with her husband prior to previous hospitalization Goals of care:   Code Status: Full Code.  Discussed today.  Wants to remain full code Nutritional status: Body mass index is 25.78 kg/m. Nutrition Problem: Inadequate oral intake Etiology: decreased appetite Signs/Symptoms: per patient/family report Diet:  Diet Order             Diet Heart Room service appropriate? Yes; Fluid  consistency: Thin  Diet effective now                  DVT prophylaxis:  SCDsPlace and maintain sequential compression device Start: 05/03/21 1316 apixaban (ELIQUIS) tablet 10 mg  apixaban (ELIQUIS) tablet 5 mg   Antimicrobials: None Fluid: None Consultants: None Family Communication: I spoke to patient's husband on the phone today.  Status is: Observation  Remains inpatient appropriate because: Needs further diuresis  Dispo: The patient is from: [facility] SNF              Anticipated d/c is to: Patient states she does not want to return to rehab and wants to go home              Patient currently is not medically stable to d/c.   Difficult to place patient No     Infusions:   sodium chloride     albumin human      Scheduled Meds:  apixaban  10 mg Oral BID   Followed by   Derrill Memo ON 05/11/2021] apixaban  5 mg Oral BID   Chlorhexidine  Gluconate Cloth  6 each Topical Daily   feeding supplement  1 Container Oral TID BM   furosemide  40 mg Intravenous BID   multivitamin with minerals  1 tablet Oral Daily   pantoprazole  40 mg Oral Daily   sodium chloride flush  3 mL Intravenous Q12H    PRN meds: sodium chloride, acetaminophen, HYDROcodone-acetaminophen, lidocaine (PF), LORazepam, prochlorperazine, sodium chloride flush   Antimicrobials: Anti-infectives (From admission, onward)    None       Objective: Vitals:   05/05/21 0405 05/05/21 0745  BP: 125/79 115/72  Pulse: 100 (!) 101  Resp: 18 14  Temp: (!) 97.4 F (36.3 C) 98 F (36.7 C)  SpO2: 98% 100%    Intake/Output Summary (Last 24 hours) at 05/05/2021 1024 Last data filed at 05/04/2021 2126 Gross per 24 hour  Intake 720 ml  Output 800 ml  Net -80 ml   Filed Weights   05/02/21 1857 05/04/21 0400 05/05/21 0405  Weight: 65.7 kg 65.1 kg 61.9 kg   Weight change: -3.2 kg Body mass index is 25.78 kg/m.   Physical Exam: General exam: Pleasant elderly African-American female.  Continues to have  anasarca.  Not in physical distress Skin: No rashes, lesions or ulcers. HEENT: Atraumatic, normocephalic, no obvious bleeding Lungs: Clear to auscultation bilaterally CVS: Regular rate and rhythm, no murmur GI/Abd soft, nontender, nondistended, bowel sound present CNS: Alert, awake, oriented x3 Psychiatry: Mood appropriate Extremities: All 4 extremities swollen.  No calf tenderness  Data Review: I have personally reviewed the laboratory data and studies available.  F/u labs ordered Unresulted Labs (From admission, onward)     Start     Ordered   05/06/21 0500  Brain natriuretic peptide  Tomorrow morning,   R       Question:  Specimen collection method  Answer:  Lab=Lab collect   05/05/21 1022   05/06/21 0500  Magnesium  Tomorrow morning,   STAT       Question:  Specimen collection method  Answer:  Lab=Lab collect   05/05/21 1024   05/04/21 1700  Basic metabolic panel  Daily,   R      05/03/21 0143   05/04/21 0500  CBC  Daily,   R     See Hyperspace for full Linked Orders Report.   05/03/21 0935   05/03/21 1518  Acid Fast Culture with reflexed sensitivities  RELEASE UPON ORDERING,   STAT        05/03/21 1518            Signed, Terrilee Croak, MD [facility] 05/05/2021

## 2021-05-05 NOTE — Progress Notes (Signed)
[facility] night cross cover note:  I was contacted by RN, who conveyed the patient's request for resumption of home Zoloft as a sleep aid.  Per chart review, including review of most recent [facility] progress note, patient has QTC prolongation, with most recent EKG performed around noon on 05/05/2021 demonstrating QTC of 559 ms.  Consequently, will refrain from resumption of home Zoloft at this time.  Rather, I have placed order for prn melatonin for insomnia and conveyed this rationale to the patient's RN.     Babs Bertin, DO Hospitalist

## 2021-05-05 NOTE — Progress Notes (Signed)
[facility] night cross cover note:  I was contacted by RN regarding patient's request for resumption of her home Norco for chronic back pain.  I subsequently resumed home dose of Norco 5/325 mg p.o. every 12 hours as needed.     Babs Bertin, DO Hospitalist

## 2021-05-06 DIAGNOSIS — I824Y2 Acute embolism and thrombosis of unspecified deep veins of left proximal lower extremity: Secondary | ICD-10-CM | POA: Diagnosis not present

## 2021-05-06 MED ORDER — LOPERAMIDE HCL 2 MG PO CAPS
2.0000 mg | ORAL_CAPSULE | Freq: Four times a day (QID) | ORAL | Status: DC | PRN
  Administered 2021-05-06 (×2): 2 mg via ORAL
  Filled 2021-05-06 (×2): qty 1

## 2021-05-06 NOTE — Progress Notes (Signed)
PROGRESS NOTE  Debbie Barry  DOB: October 21, 1950  PCP: Glendale Chard, MD TGY:[id number]  DOA: 05/02/2021  LOS: 3 days  Hospital Day: 5  Chief Complaint  Patient presents with   Chest Pain   Abdominal Pain    Brief narrative: Debbie Barry is a 70 y.o. female with PMH significant for HTN, PAD, fatty liver, chronic back pain, spinal stenosis, generalized weakness, debility, protein calorie malnutrition, PVD, history of iron overload who was recently hospitalized from 10/20-10/26/2023 for progressive weakness, falls, found to have severe spinal stenosis with compression of L4-L5.  She was seen by neurosurgery, recommended medical management and discharged to SNF on a tapering course of dexamethasone and LCO brace and a Foley catheter in place.  Patient states that at SNF, she was not much active and was requesting a discharge to home.  On 11/2, patient was sent to the ED from SNF with complaint of chest discomfort and palpitation that she noticed while at rest.  Staff noted her to have a heart rate of 130 and sent her to ED.  In the ED, she was afebrile, saturating well on room air, heart rate in low 100s. EKG with heart rate of 101, T wave inversion in QTC 540 ms. Labs with mild normocytic anemia, sodium 132, albumin low at 1.5 CTA chest negative for PE but notable for mild cardiomegaly and large bilateral pleural effusions.   CT of the abdomen and pelvis showed DVT within the left common iliac vein and acute right renal infarction, as well as marked diffuse body wall subcutaneous edema.  Patient was started on IV heparin in the ED. Admitted to hospitalist service for further evaluation management   Subjective: Patient was seen and examined this morning. Lying in bed.  Not in distress.  Feels better. Significant improvement in her swelling of lower extremities. Swelling in the upper extremities gradually improving as well.  Assessment/Plan: Acute DVT -CT abdomen and pelvis  noted acute DVT involving left common iliac vein.  No evidence of PE on CTA chest. -Ultrasound lower extremities noted age-indeterminate DVT of the right common femoral vein and right femoral vein. -Risk factors include chronic immobility, recent steroid use. -Initially patient was started on IV heparin and later switched to Eliquis.   Acute right renal infarct  -CT abdomen showed acute right renal infarct with a known history of A. fib and a new finding of intra-abdominal DVT.  On admission, patient had red tinge in urine with hemoglobin noted in urinalysis.  Urine has cleared up now.-Echo from 2 weeks ago did not show any evidence of intracardiac shunt -Anticoagulation initiated.  Acute on chronic combined systolic and diastolic CHF -Echo from 28/76 with EF 45 to 81%, grade 1 diastolic dysfunction. -Presented with shortness of breath, anasarca, bilateral pleural effusion elevated BNP. -Currently Lasix IV 40 mg twice daily.  Swelling in the lower extremities much improved.  Swelling upper extremities gradually improving. -Unable to draw labs today.  Can skip a day and collect tomorrow. -Net IO Since Admission: -3,122.54 mL [05/06/21 1027] -Continue to monitor for daily intake output, weight, blood pressure, BNP, renal function and electrolytes. Recent Labs  Lab 05/02/21 1945 05/03/21 0304 05/04/21 0238 05/05/21 0403  BNP  --  1,679.7*  --   --   BUN 13  --  8 8  CREATININE 0.65  --  0.63 0.55  K 4.0  --  2.7* 4.4  MG  --  1.2* 1.6*  --     Chest pain/palpitation -  Patient initially had complaint of chest pain and palpitation at rest and was noted to have heart rate of 130.  At the facility, it resolved with metoprolol and sublingual nitroglycerin.  By the time of presentation to the ED, patient's symptoms had subsided. -Only mildly elevated troponin.  No complaint of chest pain since then.  Prolonged QT interval Severe hypokalemia/hypomagnesemia -Potassium level improved after  replacement.  Magnesium was replaced yesterday.  Repeat levels tomorrow.  Last EKG yesterday with QTC improved to 559 ms. Recent Labs  Lab 05/02/21 1945 05/03/21 0304 05/04/21 0238 05/05/21 0403  K 4.0  --  2.7* 4.4  MG  --  1.2* 1.6*  --     Hypoalbuminemia Anasarca protein-calorie malnutrition -Dietitian consulted. -1 dose of IV albumin given on 11/5  Bilateral pleural effusion -Large bilateral pleural effusion noted in CT chest. -Ultrasound thoracentesis of right side yielded 500 mL of fluid.  CSF analysis showed hazy fluid with slightly elevated LDH.  Cytology did not show any malignant cells  Chronic gastritis -Patient has a history of nonbleeding gastric ulcers and chronic gastritis, had EGD done in February 2022.  Continue Protonix.  Spinal stenosis; chronic back pain; chronic impaired mobility -No pain complaints on admission and no acute neurologic changes  -Continue supportive care, outpatient neurosurgery follow-up   -PT eval obtained.  Patient needs significant assistance even on sitting up to the edge of the bed.  She however does not want to go to a facility and wants to go home.  I discussed this with her husband on 1/5 and he agrees to patient's weeks of going home at discharge.  Urinary retention -Patient came in with Foley catheter for last 2 weeks after recent surgery.  It was removed on 11/5 with successful voiding trial.   Mobility: Limited mobility after last surgery Living condition: Previously living at home with her husband prior to previous hospitalization Goals of care:   Code Status: Full Code.   Nutritional status: Body mass index is 25.45 kg/m. Nutrition Problem: Inadequate oral intake Etiology: decreased appetite Signs/Symptoms: per patient/family report Diet:  Diet Order             Diet Heart Room service appropriate? Yes; Fluid consistency: Thin  Diet effective now                  DVT prophylaxis:  SCDsPlace and maintain  sequential compression device Start: 05/03/21 1316 apixaban (ELIQUIS) tablet 10 mg  apixaban (ELIQUIS) tablet 5 mg   Antimicrobials: None Fluid: None Consultants: None Family Communication: I spoke to patient's husband on the phone on 11/5.  Status is: Observation  Remains inpatient appropriate because: Needs further diuresis  Dispo: The patient is from: [facility] SN[facility]              Anticipated d/c is to: Patient states she does not want to return to rehab and wants to go home.  Probably next 1 to 2 days              Patient currently is not medically stable to d/c.   Difficult to place patient No     Infusions:   sodium chloride      Scheduled Meds:  apixaban  10 mg Oral BID   Followed by   Derrill Memo ON 05/11/2021] apixaban  5 mg Oral BID   Chlorhexidine Gluconate Cloth  6 each Topical Daily   feeding supplement  1 Container Oral TID BM   furosemide  40 mg Intravenous BID  multivitamin with minerals  1 tablet Oral Daily   pantoprazole  40 mg Oral Daily   sodium chloride flush  3 mL Intravenous Q12H    PRN meds: sodium chloride, acetaminophen, HYDROcodone-acetaminophen, lidocaine (PF), LORazepam, melatonin, prochlorperazine, sodium chloride flush   Antimicrobials: Anti-infectives (From admission, onward)    None       Objective: Vitals:   05/06/21 0334 05/06/21 0855  BP: 130/73 115/74  Pulse: 91 90  Resp: 17 14  Temp: (!) 97.5 F (36.4 C) 98 F (36.7 C)  SpO2: 100% 98%    Intake/Output Summary (Last 24 hours) at 05/06/2021 1027 Last data filed at 05/06/2021 0559 Gross per 24 hour  Intake 439.21 ml  Output 1295 ml  Net -855.79 ml    Filed Weights   05/05/21 0405 05/05/21 2328 05/06/21 0559  Weight: 61.9 kg 64.3 kg 61.1 kg   Weight change: 2.4 kg Body mass index is 25.45 kg/m.   Physical Exam: General exam: Pleasant elderly African-American female.  Continues to have anasarca.  Not in physical distress Skin: No rashes, lesions or ulcers. HEENT:  Atraumatic, normocephalic, no obvious bleeding Lungs: Clear to auscultation bilaterally.  Not on supplemental oxygen CVS: Regular rate and rhythm, no murmur GI/Abd soft, nontender, nondistended, bowel sound present CNS: Alert, awake, oriented x3 Psychiatry: Mood appropriate Extremities: Bilateral lower extremity edema much better.  Upper extremity edema improving.  Data Review: I have personally reviewed the laboratory data and studies available.  F/u labs ordered Unresulted Labs (From admission, onward)     Start     Ordered   05/06/21 0500  Brain natriuretic peptide  Tomorrow morning,   R       Question:  Specimen collection method  Answer:  Lab=Lab collect   05/05/21 1022   05/06/21 0500  Magnesium  Tomorrow morning,   R       Question:  Specimen collection method  Answer:  Lab=Lab collect   05/05/21 1024   05/04/21 8101  Basic metabolic panel  Daily,   R      05/03/21 0143   05/04/21 0500  CBC  Daily,   R     See Hyperspace for full Linked Orders Report.   05/03/21 0935   05/03/21 1518  Acid Fast Culture with reflexed sensitivities  RELEASE UPON ORDERING,   STAT        05/03/21 1518            Signed, Terrilee Croak, MD [facility] 05/06/2021

## 2021-05-06 NOTE — Plan of Care (Signed)
  Problem: Education: Goal: Knowledge of General Education information will improve Description Including pain rating scale, medication(s)/side effects and non-pharmacologic comfort measures Outcome: Progressing   

## 2021-05-06 NOTE — Progress Notes (Signed)
MD made aware that lab unable to draw pt's labs this AM. 3 phlebotomists have tried. Per their policy, pt cannot be stuck for the next 24 hours. CBC,BMP,BNP, Mg ordered for this AM.   MD said pt should be ok until tomorrow.

## 2021-05-07 ENCOUNTER — Inpatient Hospital Stay (HOSPITAL_COMMUNITY): Payer: Medicare HMO

## 2021-05-07 ENCOUNTER — Other Ambulatory Visit: Payer: Self-pay

## 2021-05-07 ENCOUNTER — Inpatient Hospital Stay: Payer: Self-pay

## 2021-05-07 DIAGNOSIS — J90 Pleural effusion, not elsewhere classified: Secondary | ICD-10-CM

## 2021-05-07 DIAGNOSIS — R601 Generalized edema: Secondary | ICD-10-CM

## 2021-05-07 DIAGNOSIS — I469 Cardiac arrest, cause unspecified: Secondary | ICD-10-CM | POA: Diagnosis not present

## 2021-05-07 DIAGNOSIS — N280 Ischemia and infarction of kidney: Secondary | ICD-10-CM | POA: Diagnosis not present

## 2021-05-07 DIAGNOSIS — R9431 Abnormal electrocardiogram [ECG] [EKG]: Secondary | ICD-10-CM

## 2021-05-07 DIAGNOSIS — E43 Unspecified severe protein-calorie malnutrition: Secondary | ICD-10-CM

## 2021-05-07 DIAGNOSIS — I4729 Other ventricular tachycardia: Secondary | ICD-10-CM

## 2021-05-07 DIAGNOSIS — I824Y2 Acute embolism and thrombosis of unspecified deep veins of left proximal lower extremity: Secondary | ICD-10-CM | POA: Diagnosis not present

## 2021-05-07 LAB — ECHOCARDIOGRAM COMPLETE
Area-P 1/2: 5.62 cm2
Height: 61 in
S' Lateral: 2.9 cm
Single Plane A4C EF: 51.4 %
Weight: 2176.38 [oz_av]

## 2021-05-07 LAB — MRSA NEXT GEN BY PCR, NASAL: MRSA by PCR Next Gen: NOT DETECTED

## 2021-05-07 LAB — SEDIMENTATION RATE: Sed Rate: 6 mm/hr (ref 0–22)

## 2021-05-07 LAB — COMPREHENSIVE METABOLIC PANEL
ALT: 11 U/L (ref 0–44)
AST: 50 U/L — ABNORMAL HIGH (ref 15–41)
Albumin: <1.5 g/dL — ABNORMAL LOW (ref 3.5–5.0)
Alkaline Phosphatase: 89 U/L (ref 38–126)
Anion gap: 6 (ref 5–15)
BUN: <5 mg/dL — ABNORMAL LOW (ref 8–23)
CO2: 24 mmol/L (ref 22–32)
Calcium: 5.5 mg/dL — CL (ref 8.9–10.3)
Chloride: 108 mmol/L (ref 98–111)
Creatinine, Ser: 0.38 mg/dL — ABNORMAL LOW (ref 0.44–1.00)
GFR, Estimated: >60 mL/min (ref >60)
Glucose, Bld: 74 mg/dL (ref 70–99)
Potassium: 2.6 mmol/L — CL (ref 3.5–5.1)
Sodium: 138 mmol/L (ref 135–145)
Total Bilirubin: 0.6 mg/dL (ref 0.3–1.2)
Total Protein: 3.4 g/dL — ABNORMAL LOW (ref 6.5–8.1)

## 2021-05-07 LAB — BASIC METABOLIC PANEL WITH GFR
Anion gap: 9 (ref 5–15)
BUN: <5 mg/dL — ABNORMAL LOW (ref 8–23)
CO2: 27 mmol/L (ref 22–32)
Calcium: 7.4 mg/dL — ABNORMAL LOW (ref 8.9–10.3)
Chloride: 97 mmol/L — ABNORMAL LOW (ref 98–111)
Creatinine, Ser: 0.5 mg/dL (ref 0.44–1.00)
GFR, Estimated: >60 mL/min
Glucose, Bld: 206 mg/dL — ABNORMAL HIGH (ref 70–99)
Potassium: <2 mmol/L — CL (ref 3.5–5.1)
Sodium: 133 mmol/L — ABNORMAL LOW (ref 135–145)

## 2021-05-07 LAB — CBC
HCT: 27.6 % — ABNORMAL LOW (ref 36.0–46.0)
Hemoglobin: 10.2 g/dL — ABNORMAL LOW (ref 12.0–15.0)
MCH: 34.3 pg — ABNORMAL HIGH (ref 26.0–34.0)
MCHC: 37 g/dL — ABNORMAL HIGH (ref 30.0–36.0)
MCV: 92.9 fL (ref 80.0–100.0)
Platelets: 300 10*3/uL (ref 150–400)
RBC: 2.97 MIL/uL — ABNORMAL LOW (ref 3.87–5.11)
RDW: 16.4 % — ABNORMAL HIGH (ref 11.5–15.5)
WBC: 8.7 10*3/uL (ref 4.0–10.5)
nRBC: 0 % (ref 0.0–0.2)

## 2021-05-07 LAB — IRON AND TIBC: Iron: 20 ug/dL — ABNORMAL LOW (ref 28–170)

## 2021-05-07 LAB — C-REACTIVE PROTEIN: CRP: 1.1 mg/dL — ABNORMAL HIGH (ref <1.0)

## 2021-05-07 LAB — BRAIN NATRIURETIC PEPTIDE: B Natriuretic Peptide: 482.9 pg/mL — ABNORMAL HIGH (ref 0.0–100.0)

## 2021-05-07 LAB — FERRITIN: Ferritin: 933 ng/mL — ABNORMAL HIGH (ref 11–307)

## 2021-05-07 LAB — URINE CULTURE: Culture: >=100000 — AB

## 2021-05-07 LAB — PHOSPHORUS: Phosphorus: 2.7 mg/dL (ref 2.5–4.6)

## 2021-05-07 LAB — MAGNESIUM
Magnesium: 0.8 mg/dL — CL (ref 1.7–2.4)
Magnesium: 2.6 mg/dL — ABNORMAL HIGH (ref 1.7–2.4)

## 2021-05-07 LAB — GLUCOSE, CAPILLARY: Glucose-Capillary: 88 mg/dL (ref 70–99)

## 2021-05-07 IMAGING — US US ABDOMEN LIMITED
2 series · 14 of 25 positions shown · non-contrast
Comparison: None.

CLINICAL DATA: Ascites on CT scan dated [DATE]

EXAM:
ULTRASOUND ABDOMEN LIMITED RIGHT UPPER QUADRANT

[Series 1: us abdomen limited ruq (liver/gb) · 13 of 64 slices shown]
[im 1/64]
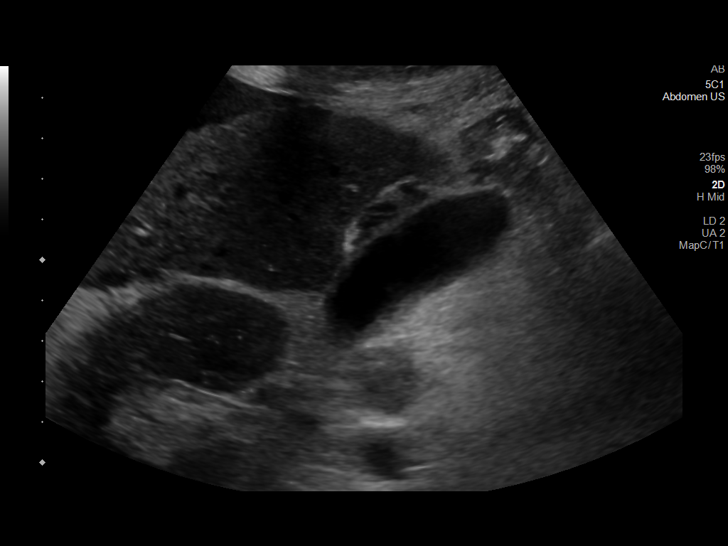
[im 6/64]
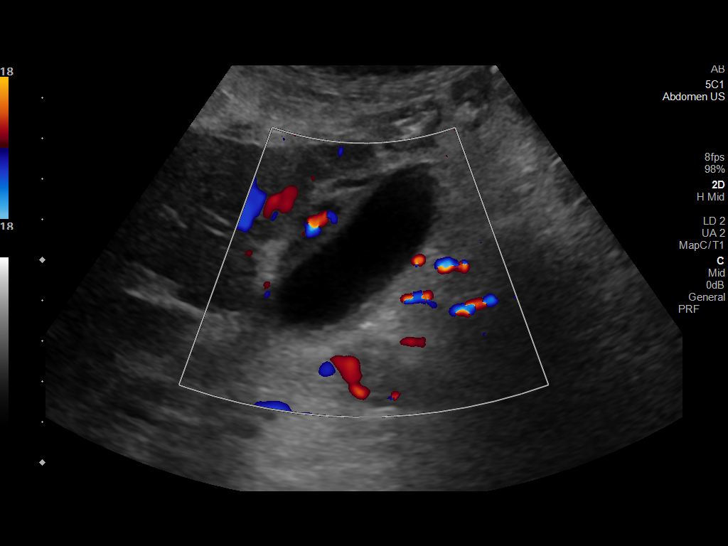
[im 11/64]
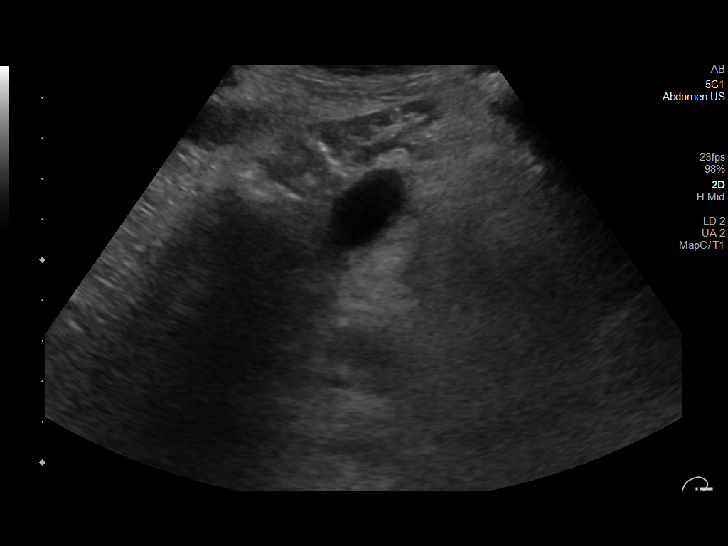
[im 17/64]
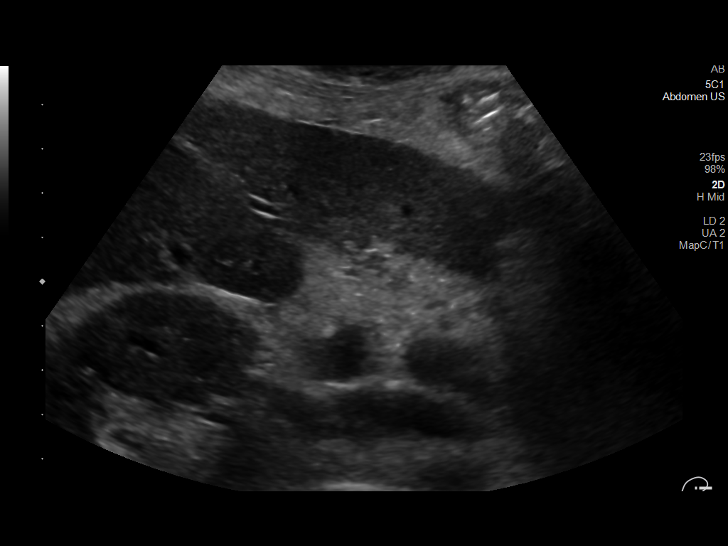
[im 22/64]
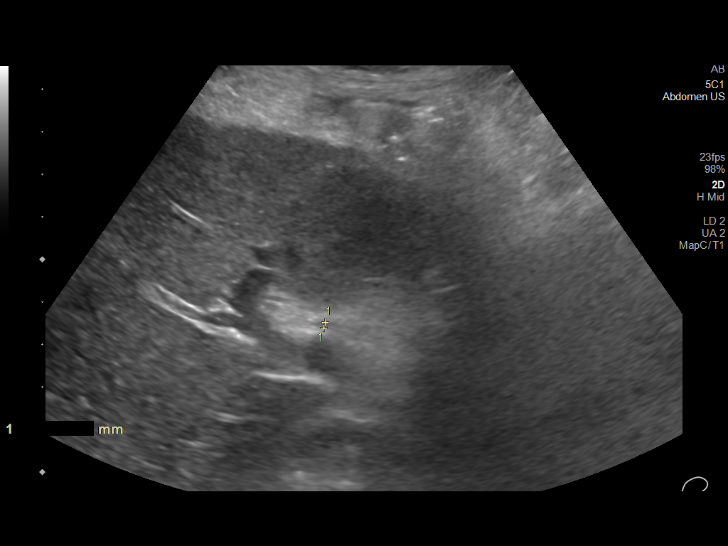
[im 25/64]
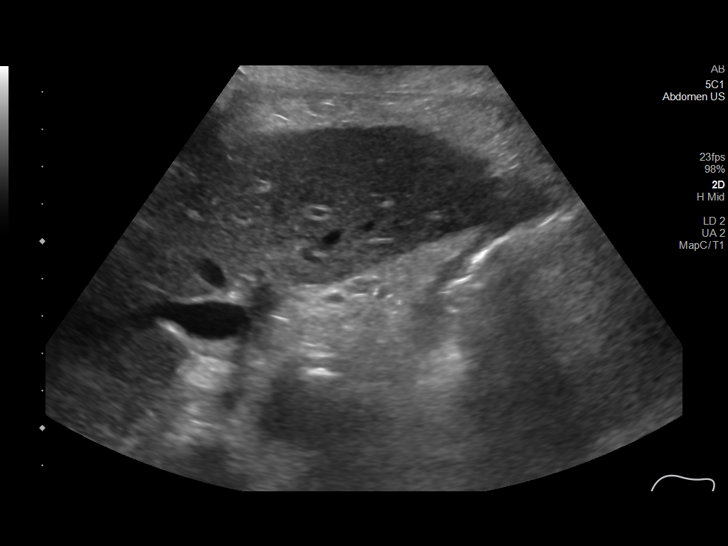
[im 31/64]
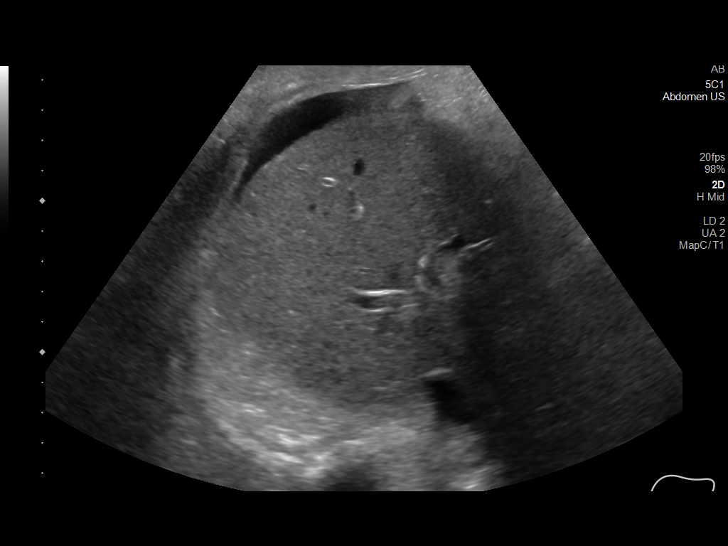
[im 36/64]
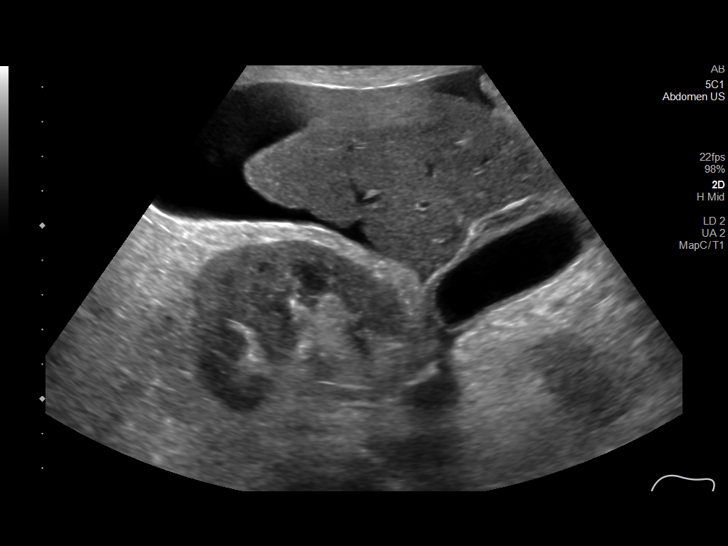
[im 42/64]
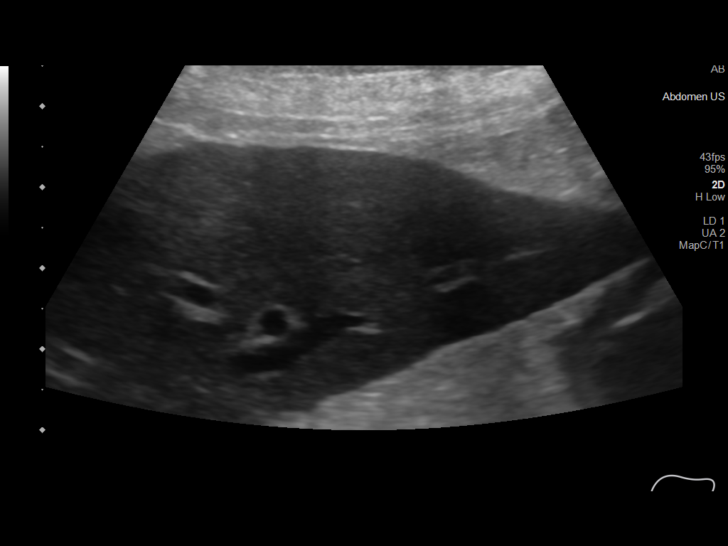
[im 44/64]
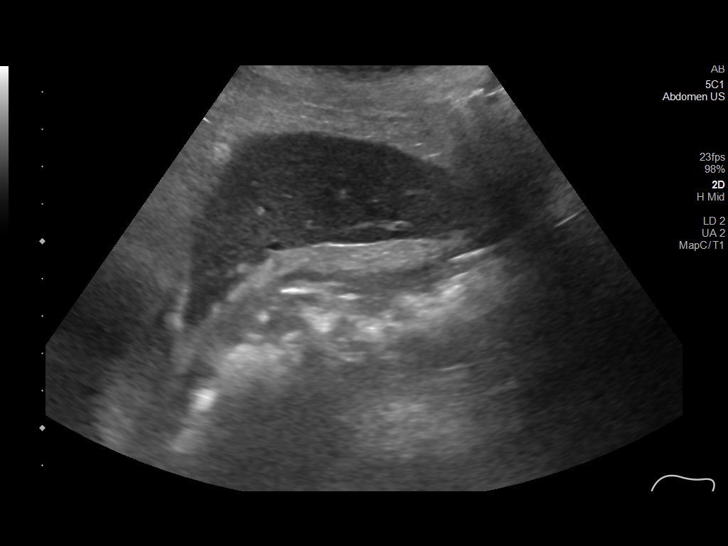
[im 50/64]
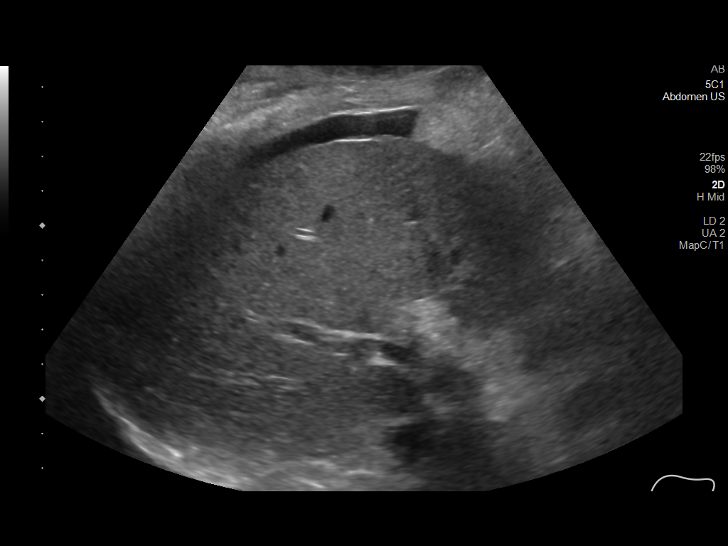
[im 55/64]
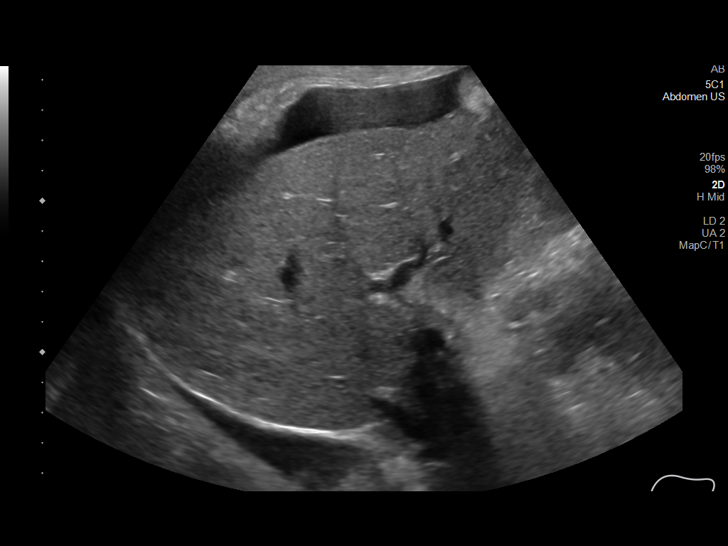
[im 61/64]
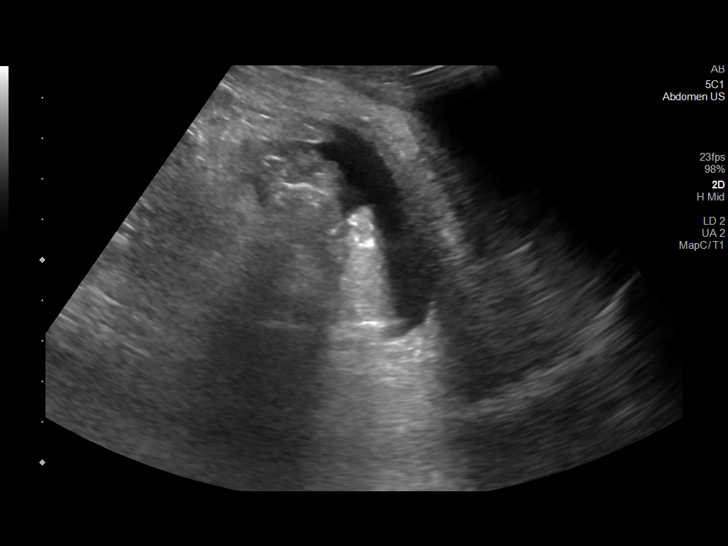

[Series 1001: abdomen us · 1 of 1 slices shown]
[im 1/1]
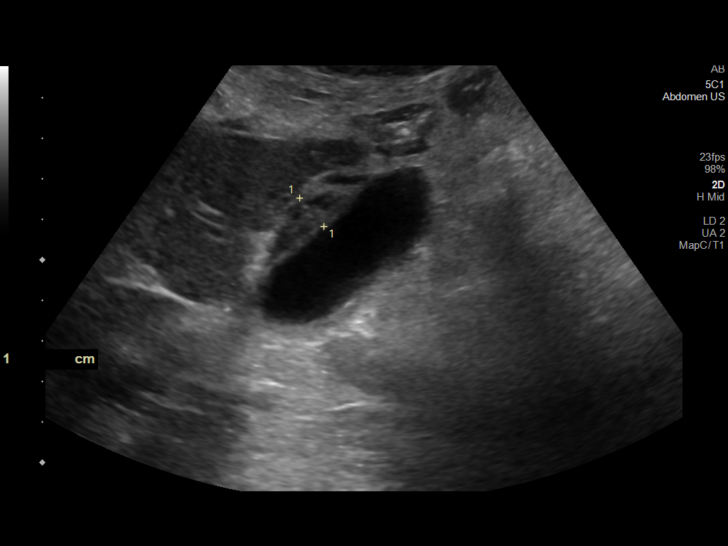

[14 of 25 positions shown; findings below may reference images not displayed]

FINDINGS: Gallbladder:

No gallstones. Mild focal thickening of the gallbladder wall
measuring up to 4 mm. The technologist reported positive sonographic
Murphy's sign.

Common bile duct:

Diameter: 2 mm

Liver:

No focal lesion identified. Within normal limits in parenchymal
echogenicity. Portal vein is patent on color Doppler imaging with
normal direction of blood flow towards the liver.

Other: Mild ascites
IMPRESSION: 1.  Mild ascites.

2. Mild focal gallbladder wall thickening and positive sonographic
Murphy sign, nonspecific findings. This may be sequela of chronic
ascites and/or volume overload. Clinical correlation is suggested.

## 2021-05-07 IMAGING — DX DG CHEST 1V PORT
1 series · 1 of 1 positions shown · non-contrast
Comparison: [DATE] and CT chest [DATE].

CLINICAL DATA: Cardiac arrest.

EXAM:
PORTABLE CHEST 1 VIEW

[chest]
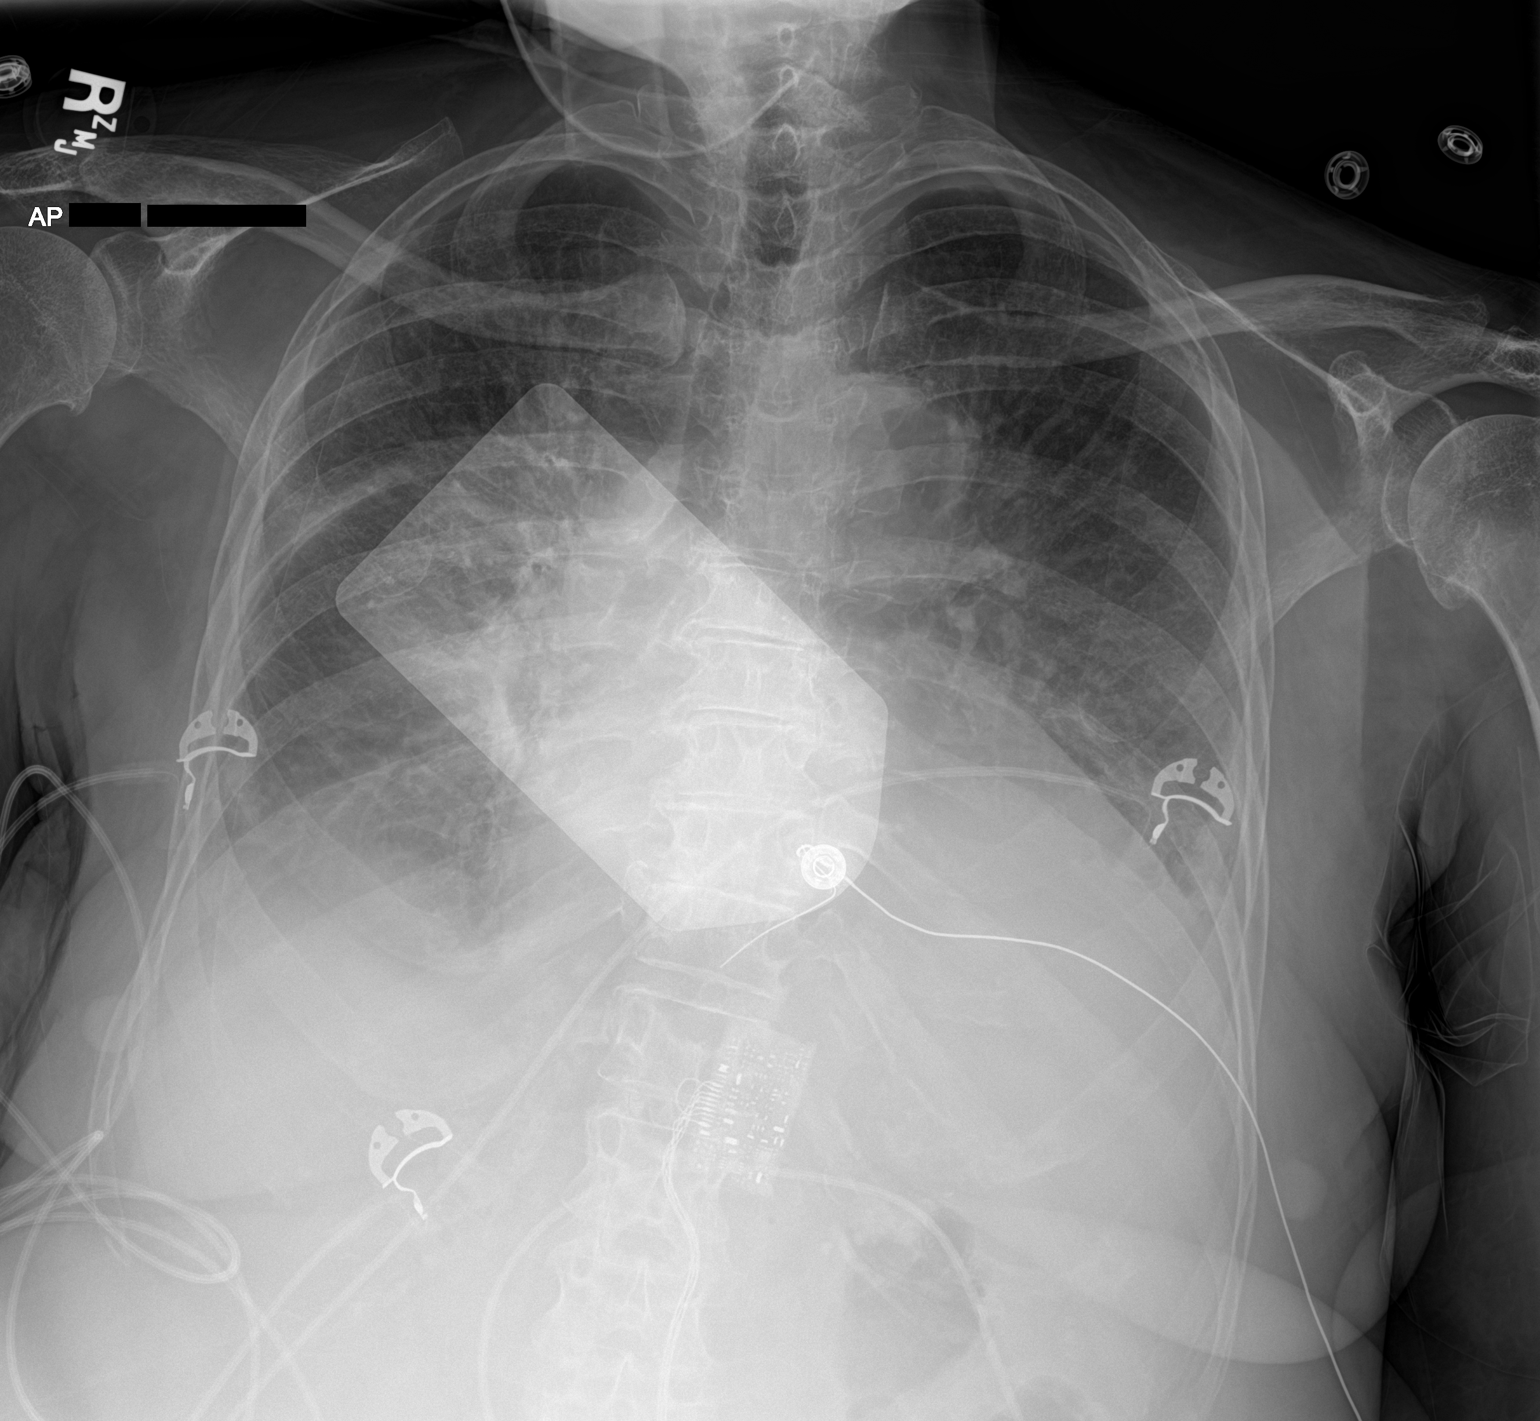

[1 of 1 positions shown; findings below may reference images not displayed]

FINDINGS: Trachea is midline. Heart is enlarged, stable. Thoracic aorta is
calcified. Defibrillator pad overlies the mid chest. There is a
vertically oriented linear density along the apical and lateral
aspect of the right hemithorax but there may be parenchymal markings
extending beyond margin. Interstitial prominence and indistinctness
with bilateral pleural effusions. Left lower lobe
collapse/consolidation.

Degenerative changes in the shoulders.
IMPRESSION: 1. Possible artifact versus small pneumothorax in the right
hemithorax. Recommend left lateral decubitus view in further
evaluation, as clinically indicated. These results will be called to
the ordering clinician or representative by the Radiologist
Assistant, and communication documented in the PACS or [REDACTED].
2. Congestive heart failure.
3. Left lower lobe collapse/consolidation. Difficult to exclude
aspiration.

## 2021-05-07 IMAGING — CR DG CHEST DECUBITUS*L*
2 series · 2 of 2 positions shown · non-contrast
Comparison: Chest radiograph done earlier the same day. CT chest
[DATE].

CLINICAL DATA: Possible pneumothorax.

EXAM:
CHEST - LEFT DECUBITUS

[chest decu (1 of 2)]
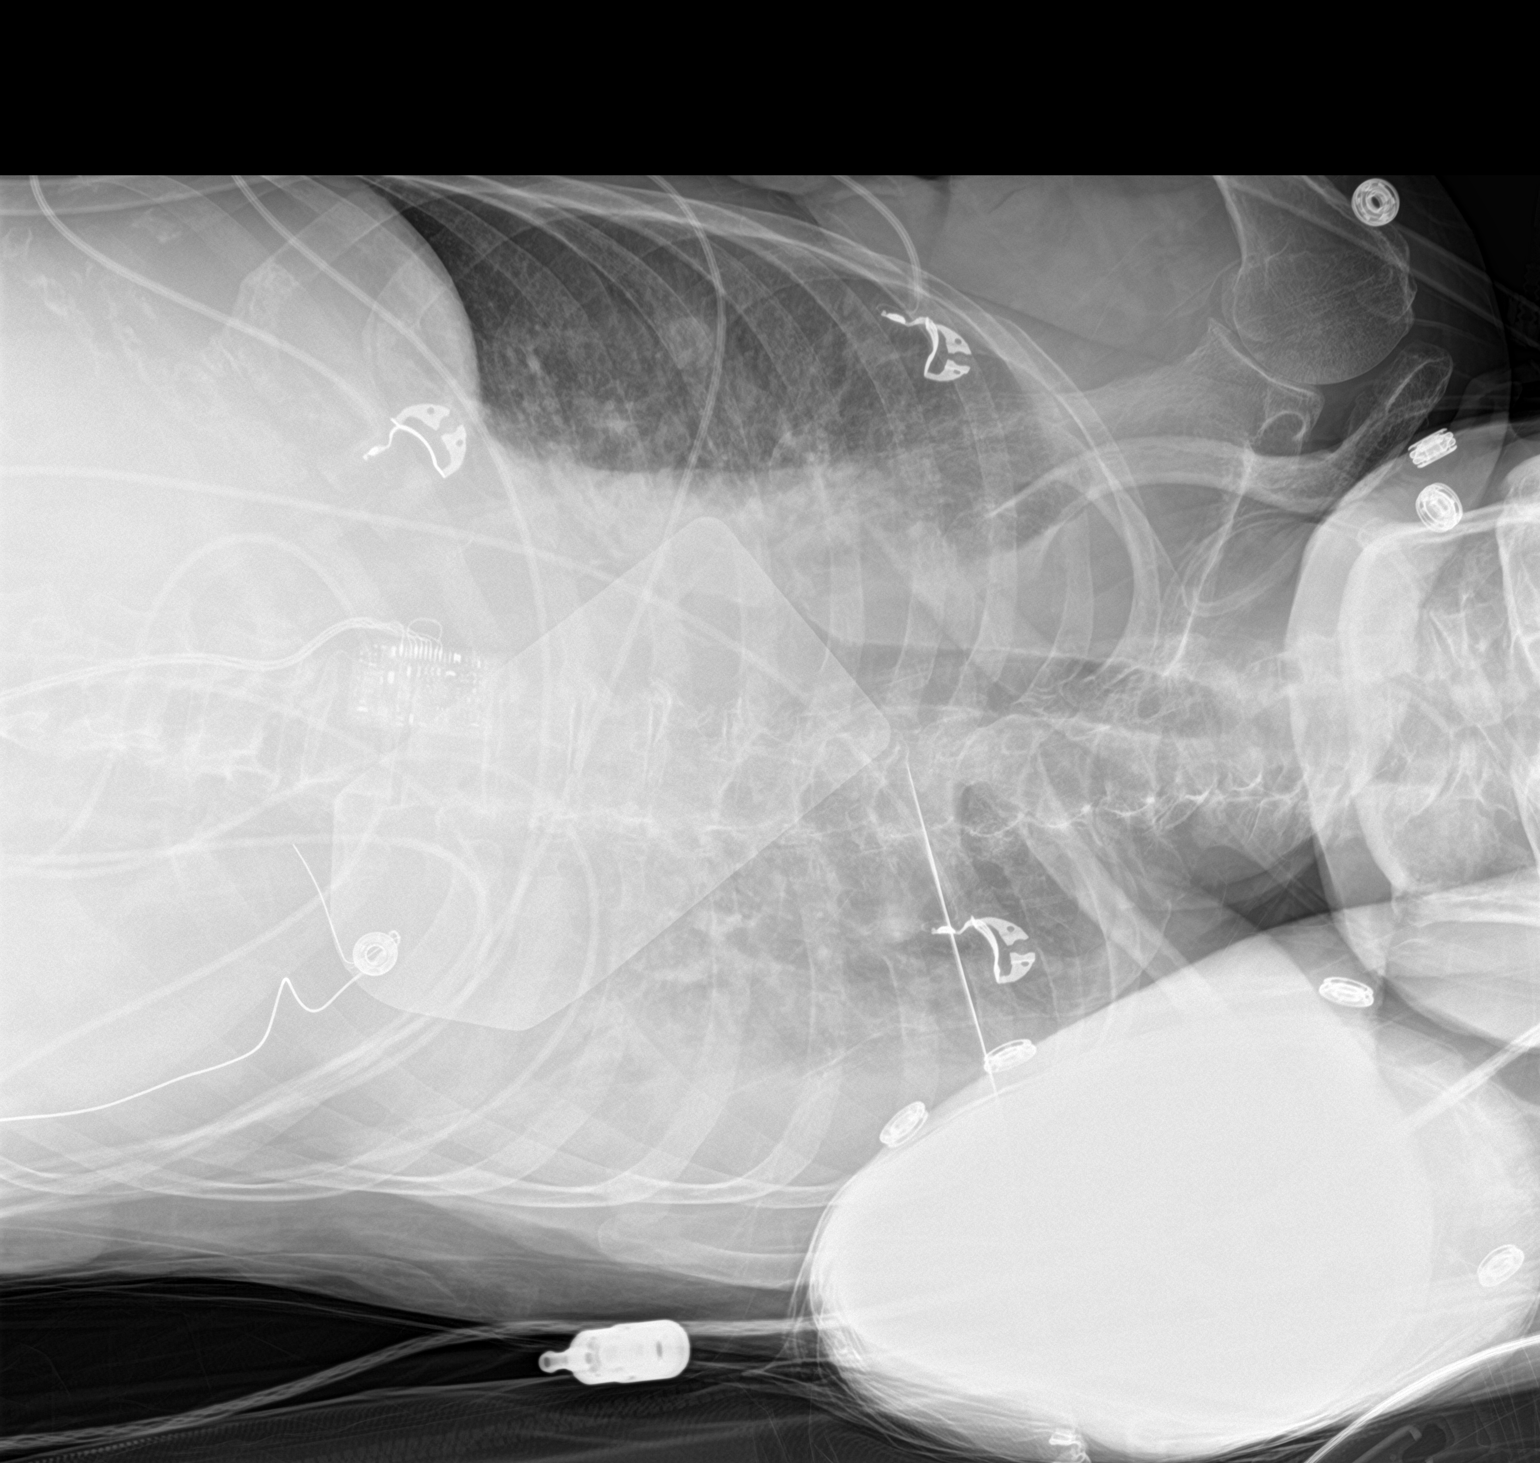

[chest decu (2 of 2)]
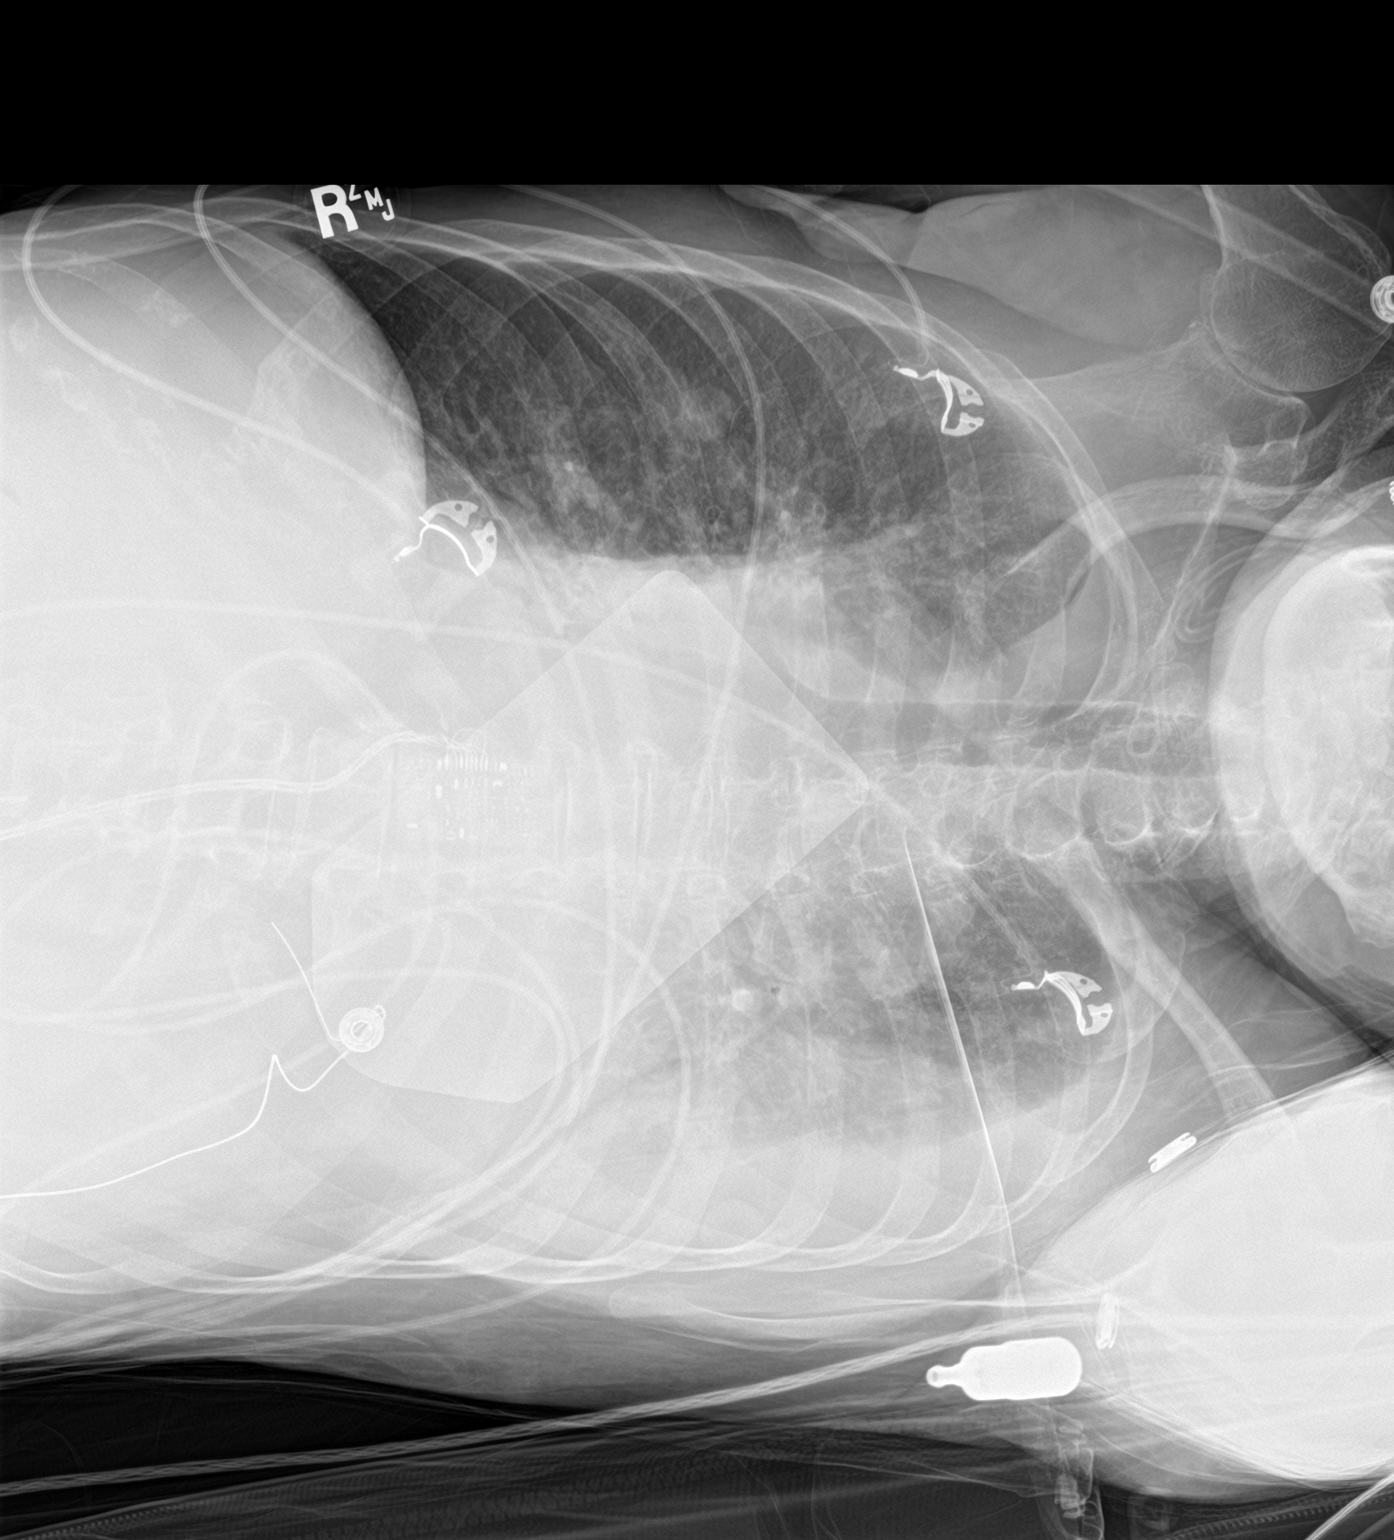

[2 of 2 positions shown; findings below may reference images not displayed]

FINDINGS: No right-sided pneumothorax. Layering bilateral pleural effusions.
Nodular density is seen in the right midlung zone. Correlation with
CT [DATE] is challenging due to a large pleural effusion and
collapse/consolidation in the right lung on that study.
IMPRESSION: 1. No right pneumothorax.
2. Nodular density in the right midlung zone. Recommend attention on
follow-up.
3. Bilateral pleural effusions.

## 2021-05-07 MED ORDER — SODIUM CHLORIDE 0.9% FLUSH
10.0000 mL | Freq: Two times a day (BID) | INTRAVENOUS | Status: DC
  Administered 2021-05-07 – 2021-05-09 (×5): 10 mL
  Administered 2021-05-10: 20 mL
  Administered 2021-05-12 – 2021-05-18 (×10): 10 mL
  Administered 2021-05-19: 20 mL
  Administered 2021-05-20 – 2021-05-22 (×5): 10 mL

## 2021-05-07 MED ORDER — POTASSIUM CHLORIDE 10 MEQ/100ML IV SOLN: 10.0000 meq | Freq: Once | INTRAVENOUS | Status: AC

## 2021-05-07 MED ORDER — POTASSIUM CHLORIDE 10 MEQ/50ML IV SOLN
INTRAVENOUS | Status: AC
  Administered 2021-05-07: 10 meq via INTRAVENOUS
  Filled 2021-05-07: qty 50

## 2021-05-07 MED ORDER — MAGNESIUM SULFATE 2 GM/50ML IV SOLN
2.0000 g | Freq: Once | INTRAVENOUS | Status: AC
  Administered 2021-05-07: 2 g via INTRAVENOUS
  Filled 2021-05-07: qty 50

## 2021-05-07 MED ORDER — POTASSIUM CHLORIDE CRYS ER 10 MEQ PO TBCR
EXTENDED_RELEASE_TABLET | ORAL | Status: AC
  Administered 2021-05-07: 40 meq via ORAL
  Filled 2021-05-07: qty 4

## 2021-05-07 MED ORDER — POTASSIUM CHLORIDE 10 MEQ/100ML IV SOLN
INTRAVENOUS | Status: AC
  Filled 2021-05-07: qty 100

## 2021-05-07 MED ORDER — SODIUM CHLORIDE 0.9% FLUSH: 10.0000 mL | INTRAVENOUS | Status: DC | PRN

## 2021-05-07 MED ORDER — POTASSIUM CHLORIDE CRYS ER 20 MEQ PO TBCR
40.0000 meq | EXTENDED_RELEASE_TABLET | ORAL | Status: AC
  Administered 2021-05-07 (×2): 40 meq via ORAL
  Filled 2021-05-07 (×2): qty 2

## 2021-05-07 MED ORDER — ALBUMIN HUMAN 5 % IV SOLN
12.5000 g | Freq: Once | INTRAVENOUS | Status: AC
  Administered 2021-05-07: 12.5 g via INTRAVENOUS
  Filled 2021-05-07 (×2): qty 250

## 2021-05-07 MED ORDER — POTASSIUM CHLORIDE 10 MEQ/50ML IV SOLN
10.0000 meq | INTRAVENOUS | Status: AC
  Administered 2021-05-07 – 2021-05-08 (×5): 10 meq via INTRAVENOUS
  Filled 2021-05-07 (×5): qty 50

## 2021-05-07 MED ORDER — MAGNESIUM SULFATE 4 GM/100ML IV SOLN
4.0000 g | Freq: Once | INTRAVENOUS | Status: AC
  Administered 2021-05-07: 4 g via INTRAVENOUS
  Filled 2021-05-07: qty 100

## 2021-05-07 MED ORDER — CALCIUM GLUCONATE-NACL 2-0.675 GM/100ML-% IV SOLN
2.0000 g | Freq: Once | INTRAVENOUS | Status: AC
  Administered 2021-05-07: 2000 mg via INTRAVENOUS
  Filled 2021-05-07: qty 100

## 2021-05-07 MED ORDER — POTASSIUM CHLORIDE CRYS ER 10 MEQ PO TBCR
40.0000 meq | EXTENDED_RELEASE_TABLET | Freq: Three times a day (TID) | ORAL | Status: AC
  Administered 2021-05-08 (×2): 40 meq via ORAL
  Filled 2021-05-07 (×2): qty 4

## 2021-05-07 MED ORDER — ISOPROTERENOL HCL 0.2 MG/ML IJ SOLN
2.0000 ug/min | INTRAVENOUS | Status: DC
  Administered 2021-05-07: 2 ug/min via INTRAVENOUS
  Administered 2021-05-08: 1 ug/min via INTRAVENOUS
  Filled 2021-05-07 (×2): qty 5

## 2021-05-07 NOTE — Consult Note (Signed)
Cardiology Consultation:   Patient ID: Victorine Mcnee MRN: [id number]; DOB: 1950-12-24  Admit date: 05/02/2021 Date of Consult: 05/07/2021  PCP:  Glendale Chard, MD   [facility] Providers Cardiologist:  Evalina Field, MD  new  Patient Profile:   Denai Caba is a 70 y.o. female with a hx of HTN, spinal stenosis w/ chronic back pain, UGIB 2nd gastric ulcers, iron overload, debility, who is being seen 05/07/2021 for the evaluation of cardiac arrest at the request of Dr Pietro Cassis.  History of Present Illness:   Ms. Maduro was hospitalized 10/20-10/26 with LE weakness/numbness and falls. Dx w/ spinal stenosis, no surgery indicated, given LCL brace and steroid taper. She had prolonged QTc and multifocal atrial tach >> keep K+ >4 and Mg >2. Foley placed for urinary retention. D/c to SNF due to weakness and family not able to provide level of care. Admitted 10/01-10/05 with N&V, diarrhea, hypotension. Leg wound noted, no infection. ABIs mildly decreased on R, TBIs decreased bilaterally.   Pt admitted 11/03 with chest pain, palpitations. HR reported 130s. Given po metoprolol and SL NTG x 2 >> sx resolved. Pt in [place] on arrival.  Chest CT neg PE, but LE Dopplers +DVT w/ R renal infarct >> started on heparin >> treatment dose Eliquis. Alb 1.5, QTc 540 ms, Mg 1.2, new anemia w/ Hgb 11.8 >> 10.2. Large bilat pleural eff on CT. EF recently 45-50%. Anasarca on exam.   11/03, pt had thoracentesis w/ 500 cc out.  11/04, K+ 2.7, Mg 1.6, QTc 604 ms, pt on IV Lasix 20 bid, electrolytes supplemented 11/05, given IV albumin, Lasix increased to 40 mg IV bid, QTc 559 ms 11/06, continue care  11/07, 08:37 am,  pt  had VF arrest, CPR x 3 minutes, pt >> ST, no drugs or shocks needed K+ 2.6, Calcium 5.5, Mg 0.8, alb < 1.5, AST 50, T prot 3.4, BNP 482.9, H&H 10.2/27.6, CXR w/ CHF, LLL collapse ?aspiration, QTc 579 ms 11/07, 11:36 am, VT on telemetry, +CP, kept pulses, s/p DCCV x 1, did not lose LOC.  Cards asked to see.   Ms. Volk notes that the palpitations she had today reminded her of the ones that she had at the rehab facility prior to admission.  She did not get chest pain today, but did get it when she was at rehab.  No telemetry or ECG of the rapid heart rate is available.  Today, she had sudden onset of the palpitations.  She does not really remember getting CPR for the first episode or getting shocked for the second episode.  Prior to admission, she had been unable to eat for couple of months.  She stated she would have a good appetite, but was throwing up most of what she ate.  She had at least 1 ER visit for this, but they were not able to find a cause.  However, since being in the hospital, she has been able to keep food down.  She is very happy about that.  She does not have any history of being unable to absorb nutrients.   Past Medical History:  Diagnosis Date   Fatty liver    GERD (gastroesophageal reflux disease)    Hypertension    Osteomyelitis ([place])    right third toe   Peripheral vascular disease ([place])    Ulcer 08/14/20    Past Surgical History:  Procedure Laterality Date   AMPUTATION Right 08/08/2017   Procedure: AMPUTATION RIGHT 3RD TOE;  Surgeon:  Newt Minion, MD;  Location: [facility];  Service: Orthopedics;  Laterality: Right;   BIOPSY  08/14/2020   Procedure: BIOPSY;  Surgeon: Clarene Essex, MD;  Location: [facility];  Service: Endoscopy;;   Bunionectomy Right 2006     Right   Bunionectony Left 2006      August   COLONOSCOPY N/A 01/21/2014   Procedure: COLONOSCOPY;  Surgeon: Beryle Beams, MD;  Location: [facility];  Service: Endoscopy;  Laterality: N/A;   ESOPHAGOGASTRODUODENOSCOPY (EGD) WITH PROPOFOL N/A 08/14/2020   Procedure: ESOPHAGOGASTRODUODENOSCOPY (EGD) WITH PROPOFOL;  Surgeon: Clarene Essex, MD;  Location: [facility];  Service: Endoscopy;  Laterality: N/A;   IR THORACENTESIS ASP PLEURAL SPACE W/IMG GUIDE  05/03/2021   TUBAL LIGATION      WISDOM TOOTH EXTRACTION       Home Medications:  Prior to Admission medications   Medication Sig Start Date End Date Taking? Authorizing Provider  acetaminophen (TYLENOL) 325 MG tablet Take 650 mg by mouth every 6 (six) hours as needed for moderate pain.   Yes [provider]  bismuth subsalicylate (PEPTO BISMOL) 262 MG/15ML suspension Take 30 mLs by mouth every 6 (six) hours as needed for indigestion.   Yes [provider]  calcium carbonate (TUMS EX) 750 MG chewable tablet Chew 1 tablet by mouth daily as needed for heartburn.   Yes [provider]  Cholecalciferol (VITAMIN D3) 5000 units CAPS Take 5,000 Units by mouth daily.   Yes [provider]  HYDROcodone-acetaminophen (NORCO) 5-325 MG tablet Take 1 tablet by mouth every 12 (twelve) hours as needed for moderate pain. 05/02/21  Yes Medina-Vargas, Monina C, NP  hydrOXYzine (VISTARIL) 25 MG capsule Take 1 capsule (25 mg total) by mouth 2 (two) times daily as needed. Patient taking differently: Take 25 mg by mouth 2 (two) times daily as needed for anxiety. 04/26/21  Yes Medina-Vargas, Monina C, NP  Magnesium Hydroxide (MILK OF MAGNESIA PO) Take 30 mLs by mouth daily as needed (constipation).   Yes [provider]  Multiple Vitamin (MULTIVITAMIN WITH MINERALS) TABS tablet Take 1 tablet by mouth daily.   Yes [provider]  ondansetron (ZOFRAN ODT) 4 MG disintegrating tablet Take 1 tablet (4 mg total) by mouth every 8 (eight) hours as needed for nausea or vomiting. 04/04/21  Yes Mariel Aloe, MD  pantoprazole (PROTONIX) 40 MG tablet Take 1 tablet (40 mg total) by mouth daily. 04/04/21  Yes Mariel Aloe, MD  potassium chloride SA (KLOR-CON) 20 MEQ tablet Take 1 tablet (20 mEq total) by mouth 2 (two) times daily. 02/14/21  Yes Magrinat, Virgie Dad, MD  sertraline (ZOLOFT) 25 MG tablet Take 1 tablet (25 mg total) by mouth daily. 02/01/21  Yes Ghumman, Ramandeep, NP  Tetrahydrozoline HCl (VISINE OP)  Place 2 drops into both eyes daily as needed (for dry/irritated eyes).    Yes [provider]  feeding supplement (ENSURE ENLIVE / ENSURE PLUS) LIQD Take 237 mLs by mouth 3 (three) times daily between meals. Patient not taking: Reported on 05/03/2021 04/04/21   Mariel Aloe, MD  nystatin (MYCOSTATIN) 100000 UNIT/ML suspension SWISH 5 ML BY MOUTH AND SPIT OUT FOUR TIMES DAILY X 2 WEEKS FOR ORAL YEAST Patient not taking: Reported on 05/03/2021    [provider]    Inpatient Medications: Scheduled Meds:  apixaban  10 mg Oral BID   Followed by   Derrill Memo ON 05/11/2021] apixaban  5 mg Oral BID   Chlorhexidine Gluconate Cloth  6 each  Topical Daily   feeding supplement  1 Container Oral TID BM   multivitamin with minerals  1 tablet Oral Daily   pantoprazole  40 mg Oral Daily   potassium chloride  40 mEq Oral Q2H   sodium chloride flush  3 mL Intravenous Q12H   Continuous Infusions:  sodium chloride     albumin human     PRN Meds: sodium chloride, acetaminophen, HYDROcodone-acetaminophen, lidocaine (PF), loperamide, melatonin, sodium chloride flush  Allergies:   No Known Allergies  Social History:   Social History   Socioeconomic History   Marital status: Married    Spouse name: Not on file   Number of children: Not on file   Years of education: Not on file   Highest education level: Not on file  Occupational History   Not on file  Tobacco Use   Smoking status: Never   Smokeless tobacco: Never  Vaping Use   Vaping Use: Never used  Substance and Sexual Activity   Alcohol use: Yes    Alcohol/week: 5.0 standard drinks    Types: 5 Glasses of wine per week    Comment: social glass of wine   Drug use: No   Sexual activity: Yes    Birth control/protection: Post-menopausal  Other Topics Concern   Not on file  Social History Narrative   Not on file   Social Determinants of Health   Financial Resource Strain: Not on file  Food Insecurity: Not on file   Transportation Needs: Not on file  Physical Activity: Not on file  Stress: Not on file  Social Connections: Not on file  Intimate Partner Violence: Not on file    Family History:   Family History  Problem Relation Age of Onset   Breast cancer Mother    Colon cancer Mother    Heart Problems Mother    Hypertension Mother    Multiple myeloma Mother    Arthritis Mother    Cancer Mother    Heart disease Mother    Hypertension Father    Heart disease Father    Multiple myeloma Father    Arthritis Father    Cancer Father      ROS:  Please see the history of present illness.  All other ROS reviewed and negative.     Physical Exam/Data:   Vitals:   05/07/21 1300 05/07/21 1315 05/07/21 1330 05/07/21 1345  BP:  126/66    Pulse:  93 90 90  Resp: (!) $RemoveB'21 12 11 11  'fMOAEMKa$ Temp:      TempSrc:      SpO2: (!) 72% 96% 99% 100%  Weight:      Height:        Intake/Output Summary (Last 24 hours) at 05/07/2021 1400 Last data filed at 05/07/2021 0551 Gross per 24 hour  Intake --  Output 500 ml  Net -500 ml   Last 3 Weights 05/07/2021 05/06/2021 05/05/2021  Weight (lbs) 136 lb 0.4 oz 134 lb 11.2 oz 141 lb 12.1 oz  Weight (kg) 61.7 kg 61.1 kg 64.3 kg     Body mass index is 25.7 kg/m.  General:  Well nourished, well developed, female in no acute distress HEENT: normal Neck:  JVD 10-11 cm Vascular: No carotid bruits; Distal pulses 2+ bilaterally Cardiac:  normal S1, S2; RRR; no murmur  Lungs: Rales bases bilaterally, no wheezing, rhonchi   Abd: Firm, tender, no hepatomegaly noted but not able to palpate deeply Ext: Trace edema Musculoskeletal:  No deformities, BUE and  BLE strength weak but equal, legs are much weaker than arms Skin: warm and dry  Neuro:  CNs 2-12 intact, no focal abnormalities noted Psych:  Normal affect   EKG:  The EKG from 11/07 8:12 AM was personally reviewed:   Vent. rate 106 BPM PR interval 124 ms QRS duration 72 ms QT/QTcB 436/579 ms  Critical Test Result:  Long QTc P-R-T axes 39 -20 170 Sinus tachycardia with Premature atrial complexes Septal infarct , age undetermined T wave abnormality, consider inferior ischemia T wave abnormality, consider anterolateral ischemia Prolonged QT Abnormal ECG Telemetry:  Telemetry was personally reviewed and demonstrates: Current telemetry does not have any saved strips. Telemetry strips reviewed showed multiple episodes of VT and course VF today.   Relevant CV Studies:  ECHO: 05/07/2021, pending  LE DOPPLERS, DVT STUDY: 05/03/2021 Summary:  RIGHT:  - Findings consistent with age indeterminate deep vein thrombosis  involving the right common femoral vein, and right femoral vein.  - No cystic structure found in the popliteal fossa.     LEFT:  - There is no evidence of deep vein thrombosis in the lower extremity.  - No cystic structure found in the popliteal fossa.   CTA CHEST/ABD/PELVIS: 05/02/2021 IMPRESSION: No pulmonary embolism.   Mild global cardiomegaly.   Large bilateral pleural effusions, mild ascites, and marked diffuse body wall subcutaneous edema in keeping with anasarca, possibly related to cardiogenic failure.   Acute right renal infarct. Infarct comprises less than 10% of the cortex of the right kidney.   DVT within the left common iliac vein. Given the presence of a systemic visceral infarct, echocardiographic correlation for an intracardiac shunt and potential for paradoxical embolization may be helpful.  ECHO: 04/21/2021  1. Left ventricular ejection fraction, by estimation, is 45 to 50%. The  left ventricle has low normal function. The left ventricle demonstrates  regional wall motion abnormalities (see scoring diagram/findings for  description). Left ventricular diastolic   parameters are consistent with Grade I diastolic dysfunction (impaired  relaxation).   2. Right ventricular systolic function is normal. The right ventricular  size is normal. Tricuspid  regurgitation signal is inadequate for assessing  PA pressure.   3. Large pleural effusion in the left lateral region.   4. The mitral valve is normal in structure. Trivial mitral valve  regurgitation. No evidence of mitral stenosis.   5. The aortic valve is tricuspid. There is mild calcification of the  aortic valve. There is mild thickening of the aortic valve. Aortic valve  regurgitation is not visualized. No aortic stenosis is present.   6. The inferior vena cava is normal in size with greater than 50%  respiratory variability, suggesting right atrial pressure of 3 mmHg.   ABI's 04/02/2021 +-------+-----------+-----------+------------+------------+  ABI/TBIToday's ABIToday's TBIPrevious ABIPrevious TBI  +-------+-----------+-----------+------------+------------+  Right  0.9        0.35                                 +-------+-----------+-----------+------------+------------+  Left   1.22       0.44                                 +-------+-----------+-----------+------------+------------+   Laboratory Data:  High Sensitivity Troponin:   Recent Labs  Lab 05/02/21 1945 05/03/21 0304  TROPONINIHS 28* 32*     Chemistry Recent Labs  Lab 05/03/21  0304 05/04/21 0238 05/05/21 0403 05/07/21 0855  NA  --  135 135 138  K  --  2.7* 4.4 2.6*  CL  --  100 103 108  CO2  --  $R'27 23 24  'tb$ GLUCOSE  --  98 79 74  BUN  --  8 8 <5*  CREATININE  --  0.63 0.55 0.38*  CALCIUM  --  7.6* 7.3* 5.5*  MG 1.2* 1.6*  --  0.8*  GFRNONAA  --  >60 >60 >60  ANIONGAP  --  $R'8 9 6    'aY$ Recent Labs  Lab 05/02/21 1945 05/07/21 0855  PROT 4.4* 3.4*  ALBUMIN 1.5* <1.5*  AST 25 50*  ALT 17 11  ALKPHOS 128* 89  BILITOT 0.6 0.6   Lipids No results for input(s): CHOL, TRIG, HDL, LABVLDL, LDLCALC, CHOLHDL in the last 168 hours.  Hematology Recent Labs  Lab 05/04/21 0238 05/05/21 0403 05/07/21 0855  WBC 8.6 6.9 8.7  RBC 3.58* 3.22* 2.97*  HGB 12.2 10.7* 10.2*  HCT 32.7* 29.9*  27.6*  MCV 91.3 92.9 92.9  MCH 34.1* 33.2 34.3*  MCHC 37.3* 35.8 37.0*  RDW 15.6* 15.4 16.4*  PLT 304 310 300   Thyroid No results for input(s): TSH, FREET4 in the last 168 hours.  BNP Recent Labs  Lab 05/03/21 0304 05/07/21 0900  BNP 1,679.7* 482.9*    DDimer No results for input(s): DDIMER in the last 168 hours. Lab Results  Component Value Date   TSH 3.190 03/31/2021   Urinalysis    Component Value Date/Time   COLORURINE AMBER (A) 05/02/2021 2100   APPEARANCEUR CLOUDY (A) 05/02/2021 2100   LABSPEC 1.026 05/02/2021 2100   PHURINE 6.0 05/02/2021 2100   GLUCOSEU NEGATIVE 05/02/2021 2100   HGBUR LARGE (A) 05/02/2021 2100   BILIRUBINUR NEGATIVE 05/02/2021 2100   KETONESUR NEGATIVE 05/02/2021 2100   PROTEINUR 100 (A) 05/02/2021 2100   NITRITE NEGATIVE 05/02/2021 2100   LEUKOCYTESUR MODERATE (A) 05/02/2021 2100   Iron/TIBC/Ferritin/ %Sat    Component Value Date/Time   IRON 72 02/01/2021 1509   TIBC <89 (LL) 02/01/2021 1509   FERRITIN 1,450 (H) 02/01/2021 1509   IRONPCTSAT >81 (HH) 02/01/2021 1509   Iron  Date Value Ref Range Status  02/01/2021 72 27 - 139 ug/dL Final  03/14/2020 207 (H) 27 - 139 ug/dL Final     Radiology/Studies:  DG Chest 1 View  Result Date: 05/03/2021 CLINICAL DATA:  Status post thoracentesis on the right. EXAM: CHEST  1 VIEW COMPARISON:  CT earlier today.  Radiograph yesterday. FINDINGS: No pneumothorax post right thoracentesis. Right pleural effusion was previously not well seen by radiograph. There is similar blunting of the costophrenic angle. Fluid is seen tracking in the right minor fissure. Unchanged left pleural effusion and basilar opacity. Stable cardiomegaly. No pulmonary edema. IMPRESSION: 1. No pneumothorax post right thoracentesis. 2. Unchanged left pleural effusion and basilar opacity. 3. Stable cardiomegaly. Electronically Signed   By: Keith Rake M.D.   On: 05/03/2021 15:24   DG Chest Left Decubitus  Result Date:  05/07/2021 CLINICAL DATA:  Possible pneumothorax. EXAM: CHEST - LEFT DECUBITUS COMPARISON:  Chest radiograph done earlier the same day. CT chest 05/03/2021. FINDINGS: No right-sided pneumothorax. Layering bilateral pleural effusions. Nodular density is seen in the right midlung zone. Correlation with CT 05/03/2021 is challenging due to a large pleural effusion and collapse/consolidation in the right lung on that study. IMPRESSION: 1. No right pneumothorax. 2. Nodular density in the right midlung zone. Recommend  attention on follow-up. 3. Bilateral pleural effusions. Electronically Signed   By: Lorin Picket M.D.   On: 05/07/2021 13:15   DG CHEST PORT 1 VIEW  Result Date: 05/07/2021 CLINICAL DATA:  Cardiac arrest. EXAM: PORTABLE CHEST 1 VIEW COMPARISON:  05/03/2021 and CT chest 05/03/2021. FINDINGS: Trachea is midline. Heart is enlarged, stable. Thoracic aorta is calcified. Defibrillator pad overlies the mid chest. There is a vertically oriented linear density along the apical and lateral aspect of the right hemithorax but there may be parenchymal markings extending beyond margin. Interstitial prominence and indistinctness with bilateral pleural effusions. Left lower lobe collapse/consolidation. Degenerative changes in the shoulders. IMPRESSION: 1. Possible artifact versus small pneumothorax in the right hemithorax. Recommend left lateral decubitus view in further evaluation, as clinically indicated. These results will be called to the ordering clinician or representative by the Radiologist Assistant, and communication documented in the PACS or [employer]. 2. Congestive heart failure. 3. Left lower lobe collapse/consolidation. Difficult to exclude aspiration. Electronically Signed   By: Lorin Picket M.D.   On: 05/07/2021 08:55   IR THORACENTESIS ASP PLEURAL SPACE W/IMG GUIDE  Result Date: 05/03/2021 INDICATION: Patient with a history of heart failure presents today with bilateral pleural effusions.  Interventional radiology asked to perform a diagnostic and therapeutic thoracentesis. EXAM: ULTRASOUND GUIDED THORACENTESIS MEDICATIONS: 1% lidocaine 20 mL COMPLICATIONS: None immediate. PROCEDURE: An ultrasound guided thoracentesis was thoroughly discussed with the patient and questions answered. The benefits, risks, alternatives and complications were also discussed. The patient understands and wishes to proceed with the procedure. Written consent was obtained. Ultrasound was performed to localize and mark an adequate pocket of fluid in the right chest. The area was then prepped and draped in the normal sterile fashion. 1% Lidocaine was used for local anesthesia. Under ultrasound guidance a 6 Fr Safe-T-Centesis catheter was introduced. Thoracentesis was performed. The catheter was removed and a dressing applied. FINDINGS: A total of approximately 500 mL of clear yellow fluid was removed. Samples were sent to the laboratory as requested by the clinical team. IMPRESSION: Successful ultrasound guided right thoracentesis yielding 500 mL of pleural fluid. Read by: Soyla Dryer, NP Electronically Signed   By: Jacqulynn Cadet M.D.   On: 05/03/2021 16:15     Assessment and Plan:   Cardiac arrest -She had a VT >> course V. fib arrest today requiring 3 minutes of CPR - Later this morning, she had another VT >> VF arrest requiring defib -Patient says she does not remember the shock or the CPR -ECG G done after first episode shows diffuse lateral ST depression - We will recheck ECG now - QTC on 11/04 was 604 ms, 11/05 was 559 ms, appears prolonged, repeat ECG pending -She does not remember having any chest pain a, although she had some during her episode of palpitations prior to admission - Continue to supplement electrolytes aggressively and check routinely - Because of the need for more frequent electrolyte checks and the difficulty with blood draws, insert PICC line - The episodes are preceded by  bradycardia and PVCs, bigeminal pattern by periods although full review is not possible - She is not on a beta-blocker or any rate lowering medications - If QTC remains prolonged and she continues to have bradycardia after electrolytes are corrected, EP to see for possible pacemaker/ICD  2.  Electrolyte abnormalities: - Her albumin has been supplemented, and the level subsequently went down - Her calcium level is lower today than it has been - Her potassium has been  consistently low despite supplementation - She feels she needs at least 120 mEq today with a recheck later - Hold Lasix for now - Iron level was previously 207.  It dropped to normal after she was admitted with a GI bleed - Recheck iron level, if it is once again elevated, may need evaluation for hemochromatosis - will ck labs associated w/ rheumatological dz, suspect an underlying condition for the electrolyte abnormalities that have preceded her current admission     Risk Assessment/Risk Scores:      For questions or updates, please contact [place] Please consult [email or web address] for contact info under    Signed, Rosaria Ferries, PA-C  05/07/2021 2:00 PM

## 2021-05-07 NOTE — Progress Notes (Signed)
Pt arrived to [facility] room 11 via bed. A&O x, VSS. Connected to unit monitoring equipment upon arrival. VSS, Zoll and pads connected to patient. No orders to release at this time. Report received prior to patient arrival.

## 2021-05-07 NOTE — Progress Notes (Addendum)
Notified nurse observed suspected ventricular fibrillation on bedside monitor. Assessed pt who said she felt lightheaded. [place] monitor alerting again, pt was reassessed and found unresponsive and pulseless. Charge nurse notified and code blue was activated. MD paged and came to bedside.  Raelyn Number, RN

## 2021-05-07 NOTE — Progress Notes (Signed)
2 RRTs responded to CODE BLUE. Pt with pulses and emesis on arrival. Ambu no longer needed, NRB placed. SATs 97%. MD at bedside.

## 2021-05-07 NOTE — Plan of Care (Signed)
  Problem: Education: Goal: Knowledge of General Education information will improve Description: Including pain rating scale, medication(s)/side effects and non-pharmacologic comfort measures Outcome: Not Progressing   Problem: Clinical Measurements: Goal: Cardiovascular complication will be avoided Outcome: Not Progressing   Problem: Pain Managment: Goal: General experience of comfort will improve Outcome: Not Progressing

## 2021-05-07 NOTE — Consult Note (Addendum)
NAME:  Debbie Barry, MRN:  [id number], DOB:  02-25-1951, LOS: 4 ADMISSION DATE:  05/02/2021, CONSULTATION DATE:  11/7 REFERRING MD:  Dr. Pietro Cassis, CHIEF COMPLAINT:  VT/VF   History of Present Illness:  70 year old female with past medical history as below, which is significant for hypertension, spinal stenosis, gastric ulcers, and fatty liver.  She was recently admitted due to lower extremity weakness and was found to have spinal stenosis for which medical management was recommended.  She was discharged to SNF where course was relatively uneventful.  She presented again to [facility] emergency department on 11/3 with complaints of chest pain and palpitations.  Initial work-up significant for DVT and left-sided pleural effusion.  She also had diffuse anasarca.  This was all felt to be in the context of poor nutrition and low albumin.  She was treated with diuretics.  On 11/7 in the AM hours she suffered ventricular fibrillation cardiac arrest and underwent 3 minutes of CPR with return of spontaneous circulation prior to defibrillation. Then a few hours later she went into pulsatile VT, which was treated with cardioversion x 1. She was transferred to the ICU after cardiology consultation. K and mag were found to be low and supplementation was arranged. PCCM was consulted.   Pertinent  Medical History   has a past medical history of Fatty liver, GERD (gastroesophageal reflux disease), Hypertension, Osteomyelitis ([place]), Peripheral vascular disease ([place]), and Ulcer (08/14/20).   Significant Hospital Events: Including procedures, antibiotic start and stop dates in addition to other pertinent events   11/3 admitted with CP and palpitations.  Thoracentesis left for 500cc. 11/7 Cardiac arrest.   Interim History / Subjective:    Objective   Blood pressure 126/66, pulse 90, temperature 97.7 F (36.5 C), temperature source Oral, resp. rate 11, height $RemoveBe'5\' 1"'tJTdffows$  (1.549 m), weight 61.7 kg, SpO2 100 %.         Intake/Output Summary (Last 24 hours) at 05/07/2021 1615 Last data filed at 05/07/2021 0551 Gross per 24 hour  Intake --  Output 500 ml  Net -500 ml   Filed Weights   05/05/21 2328 05/06/21 0559 05/07/21 0549  Weight: 64.3 kg 61.1 kg 61.7 kg    Examination: General: elderly appearing female in NAD HENT: [place]/AT, PERRL, no JVD Lungs: Clear bilateral breath sounds Cardiovascular: Tachy, regular, no MRG Abdomen: Soft, mild diffuse tenderness.  Extremities: No acute deformity. Significant anasarca to upper extremities and hips. Lower extremity trace edema.  Neuro: Alert, oriented, non-focal   Resolved Hospital Problem list     Assessment & Plan:   VF/VT arrest: secondary to electrolyte derangements in the setting of aggressive diuresis. Bradycardic PVC seemed to cause rhythm change. Patient regained consciousness post arrest and did not require intubation.  - ICU hemodynamic monitoring - K, Mag, Calcium replacement underway - PICC placement pending for frequent BMP - Isoproterenol infusion to avoid bradycardia until electrolytes are replaced  Chronic HFrEF: LVEF 40-45%. IVC down on echo despite anasarca pointing away from cardiac cause - holding diuresis - cardiology now following  Anasarca: likely due to hypoalbuminemia. Fatty liver disease listed in history.  Protein calorie malnutrition - RUQ [place] pending - ESR, CRP pending - Consult dietician - Consider appetite stimulant.  - Diuresis on hold for now, will need to resume with potassium sparing agent when clinically indicated  Bilateral pleural effusions: protein low and LDH marginally elevated, but no serum comparisons. Likely due to hypoalbuminemia as above.   DVT - continue eliquis  Acute R  renal infarct source unclear - anticoagulation as above - ? Atrial fib history, per cards  Best Practice (right click and "Reselect all SmartList Selections" daily)   Diet/type: Regular consistency (see orders) DVT  prophylaxis: DOAC GI prophylaxis: PPI Lines: N/A Foley:  N/A Code Status:  full code Last date of multidisciplinary goals of care discussion $RemoveBeforeD'[ ]'[id number]$   Labs   CBC: Recent Labs  Lab 05/02/21 1945 05/04/21 0238 05/05/21 0403 05/07/21 0855  WBC 9.8 8.6 6.9 8.7  NEUTROABS 7.2  --   --   --   HGB 11.8* 12.2 10.7* 10.2*  HCT 32.2* 32.7* 29.9* 27.6*  MCV 93.1 91.3 92.9 92.9  PLT 317 304 310 793    Basic Metabolic Panel: Recent Labs  Lab 05/02/21 1945 05/03/21 0304 05/04/21 0238 05/05/21 0403 05/07/21 0855  NA 132*  --  135 135 138  K 4.0  --  2.7* 4.4 2.6*  CL 103  --  100 103 108  CO2 21*  --  $R'27 23 24  'iJ$ GLUCOSE 90  --  98 79 74  BUN 13  --  8 8 <5*  CREATININE 0.65  --  0.63 0.55 0.38*  CALCIUM 7.9*  --  7.6* 7.3* 5.5*  MG  --  1.2* 1.6*  --  0.8*  PHOS  --   --   --   --  2.7   GFR: Estimated Creatinine Clearance: 57.5 mL/min (A) (by C-G formula based on SCr of 0.38 mg/dL (L)). Recent Labs  Lab 05/02/21 1945 05/04/21 0238 05/05/21 0403 05/07/21 0855  WBC 9.8 8.6 6.9 8.7    Liver Function Tests: Recent Labs  Lab 05/02/21 1945 05/07/21 0855  AST 25 50*  ALT 17 11  ALKPHOS 128* 89  BILITOT 0.6 0.6  PROT 4.4* 3.4*  ALBUMIN 1.5* <1.5*   Recent Labs  Lab 05/02/21 1945  LIPASE 21   No results for input(s): AMMONIA in the last 168 hours.  ABG    Component Value Date/Time   TCO2 27 04/20/2021 0111     Coagulation Profile: No results for input(s): INR, PROTIME in the last 168 hours.  Cardiac Enzymes: No results for input(s): CKTOTAL, CKMB, CKMBINDEX, TROPONINI in the last 168 hours.  HbA1C: Hgb A1c MFr Bld  Date/Time Value Ref Range Status  02/01/2021 03:09 PM 4.5 (L) 4.8 - 5.6 % Final    Comment:             Prediabetes: 5.7 - 6.4          Diabetes: >6.4          Glycemic control for adults with diabetes: <7.0     CBG: Recent Labs  Lab 05/07/21 0820  GLUCAP 88    Review of Systems:   Bolds are positive  Constitutional: weight loss,  gain, night sweats, Fevers, chills, fatigue .  HEENT: headaches, Sore throat, sneezing, nasal congestion, post nasal drip, Difficulty swallowing, Tooth/dental problems, visual complaints visual changes, ear ache CV:  chest pain, radiates:,Orthopnea, PND, swelling in lower extremities**, dizziness, palpitations, syncope.  GI  heartburn, indigestion, abdominal pain, nausea, vomiting, diarrhea, change in bowel habits, loss of appetite, bloody stools.  Resp: cough, productive: , hemoptysis, dyspnea, chest pain musculoskeletal. , pleuritic.  Skin: rash or itching or icterus GU: dysuria, change in color of urine, urgency or frequency. flank pain, hematuria  MS: joint pain or swelling. decreased range of motion  Psych: change in mood or affect. depression or anxiety.  Neuro: difficulty with speech, weakness,  numbness, ataxia    Past Medical History:  She,  has a past medical history of Fatty liver, GERD (gastroesophageal reflux disease), Hypertension, Osteomyelitis ([place]), Peripheral vascular disease ([place]), and Ulcer (08/14/20).   Surgical History:   Past Surgical History:  Procedure Laterality Date   AMPUTATION Right 08/08/2017   Procedure: AMPUTATION RIGHT 3RD TOE;  Surgeon: Newt Minion, MD;  Location: [place];  Service: Orthopedics;  Laterality: Right;   BIOPSY  08/14/2020   Procedure: BIOPSY;  Surgeon: Clarene Essex, MD;  Location: [facility];  Service: Endoscopy;;   Bunionectomy Right 2006     Right   Bunionectony Left 2006      August   COLONOSCOPY N/A 01/21/2014   Procedure: COLONOSCOPY;  Surgeon: Beryle Beams, MD;  Location: [facility];  Service: Endoscopy;  Laterality: N/A;   ESOPHAGOGASTRODUODENOSCOPY (EGD) WITH PROPOFOL N/A 08/14/2020   Procedure: ESOPHAGOGASTRODUODENOSCOPY (EGD) WITH PROPOFOL;  Surgeon: Clarene Essex, MD;  Location: [facility];  Service: Endoscopy;  Laterality: N/A;   IR THORACENTESIS ASP PLEURAL SPACE W/IMG GUIDE  05/03/2021   TUBAL LIGATION     WISDOM TOOTH  EXTRACTION       Social History:   reports that she has never smoked. She has never used smokeless tobacco. She reports current alcohol use of about 5.0 standard drinks per week. She reports that she does not use drugs.   Family History:  Her family history includes Arthritis in her father and mother; Breast cancer in her mother; Cancer in her father and mother; Colon cancer in her mother; Heart Problems in her mother; Heart disease in her father and mother; Hypertension in her father and mother; Multiple myeloma in her father and mother.   Allergies No Known Allergies   Home Medications  Prior to Admission medications   Medication Sig Start Date End Date Taking? Authorizing Provider  acetaminophen (TYLENOL) 325 MG tablet Take 650 mg by mouth every 6 (six) hours as needed for moderate pain.   Yes [provider]  bismuth subsalicylate (PEPTO BISMOL) 262 MG/15ML suspension Take 30 mLs by mouth every 6 (six) hours as needed for indigestion.   Yes [provider]  calcium carbonate (TUMS EX) 750 MG chewable tablet Chew 1 tablet by mouth daily as needed for heartburn.   Yes [provider]  Cholecalciferol (VITAMIN D3) 5000 units CAPS Take 5,000 Units by mouth daily.   Yes [provider]  HYDROcodone-acetaminophen (NORCO) 5-325 MG tablet Take 1 tablet by mouth every 12 (twelve) hours as needed for moderate pain. 05/02/21  Yes Medina-Vargas, Monina C, NP  hydrOXYzine (VISTARIL) 25 MG capsule Take 1 capsule (25 mg total) by mouth 2 (two) times daily as needed. Patient taking differently: Take 25 mg by mouth 2 (two) times daily as needed for anxiety. 04/26/21  Yes Medina-Vargas, Monina C, NP  Magnesium Hydroxide (MILK OF MAGNESIA PO) Take 30 mLs by mouth daily as needed (constipation).   Yes [provider]  Multiple Vitamin (MULTIVITAMIN WITH MINERALS) TABS tablet Take 1 tablet by mouth daily.   Yes [provider]  ondansetron (ZOFRAN ODT) 4 MG  disintegrating tablet Take 1 tablet (4 mg total) by mouth every 8 (eight) hours as needed for nausea or vomiting. 04/04/21  Yes Mariel Aloe, MD  pantoprazole (PROTONIX) 40 MG tablet Take 1 tablet (40 mg total) by mouth daily. 04/04/21  Yes Mariel Aloe, MD  potassium chloride SA (KLOR-CON) 20 MEQ tablet Take 1 tablet (20 mEq total) by mouth 2 (  two) times daily. 02/14/21  Yes Magrinat, Virgie Dad, MD  sertraline (ZOLOFT) 25 MG tablet Take 1 tablet (25 mg total) by mouth daily. 02/01/21  Yes Ghumman, Ramandeep, NP  Tetrahydrozoline HCl (VISINE OP) Place 2 drops into both eyes daily as needed (for dry/irritated eyes).    Yes [provider]  feeding supplement (ENSURE ENLIVE / ENSURE PLUS) LIQD Take 237 mLs by mouth 3 (three) times daily between meals. Patient not taking: Reported on 05/03/2021 04/04/21   Mariel Aloe, MD  nystatin (MYCOSTATIN) 100000 UNIT/ML suspension SWISH 5 ML BY MOUTH AND SPIT OUT FOUR TIMES DAILY X 2 WEEKS FOR ORAL YEAST Patient not taking: Reported on 05/03/2021    [provider]     Critical care time: 38 minutes necessary due to cardiac arrest and vasoactive infusions.      Georgann Housekeeper, AGACNP-BC [facility]  See Amion for personal pager PCCM on call pager [phone] until 7pm. Please call Elink 7p-7a. [phone]  05/07/2021 6:01 PM

## 2021-05-07 NOTE — Progress Notes (Signed)
PROGRESS NOTE  Debbie Barry  DOB: 1951-02-13  PCP: Glendale Chard, MD YQM:[id number]  DOA: 05/02/2021  LOS: 4 days  Hospital Day: 6  Chief Complaint  Patient presents with   Chest Pain   Abdominal Pain    Brief narrative: Debbie Barry is a 70 y.o. female with PMH significant for HTN, PAD, fatty liver, chronic back pain, spinal stenosis, generalized weakness, debility, protein calorie malnutrition, PVD, history of iron overload who was recently hospitalized from 10/20-10/26/2023 for progressive weakness, falls, found to have severe spinal stenosis with compression of L4-L5.  She was seen by neurosurgery, recommended medical management and discharged to SNF on a tapering course of dexamethasone and LCO brace and a Foley catheter in place.  Patient states that at SNF, she was not much active and was requesting a discharge to home.  On 11/2, patient was sent to the ED from SNF with complaint of chest discomfort and palpitation that she noticed while at rest.  Staff noted her to have a heart rate of 130 and sent her to ED.  In the ED, she was afebrile, saturating well on room air, heart rate in low 100s. EKG with heart rate of 101, T wave inversion in QTC 540 ms. Labs with mild normocytic anemia, sodium 132, albumin low at 1.5 CTA chest negative for PE but notable for mild cardiomegaly and large bilateral pleural effusions.   CT of the abdomen and pelvis showed DVT within the left common iliac vein and acute right renal infarction, as well as marked diffuse body wall subcutaneous edema.  Patient was started on IV heparin in the ED. Admitted to hospitalist service for further evaluation management   Subjective: I was paged at bedside today after a CODE BLUE was called. Per nursing staff, patient felt lightheaded and soon after that she had a V. tach/V. fib arrest, went pulseless.  CODE BLUE was called.  CPR given for 3 minutes.  While getting prepped for defibrillation, ROSC was  achieved.   By the time of my arrival, CPR completed, patient was alert, awake.  She was kept transiently on nonrebreather mask. Of note, patient is on IV Lasix 40 mg twice daily.  Because of anasarca, she could not have her labs done yesterday despite 4 attempts. Labs obtained later this morning by IV team showed significantly low potassium, magnesium and calcium level. Replacement given  Assessment/Plan: V. fib/V. tach arrest -Patient had V. tach/V. fib arrest this morning.  Per nursing staff, patient felt lightheaded and soon after that she had a V. tach/V. fib arrest, went pulseless.  CODE BLUE was called.  CPR given for 3 minutes.  While getting prepped for defibrillation, ROSC was achieved.   -I could not personally see or print rhythm strip as a telemetry had hardware issues.   -Cardiac arrest likely related to abnormally low electrolytes in the setting of prolonged QTC.  See below. -Obtain echocardiogram. -Continue to monitor in telemetry for next 24 hours.  Hypokalemia/hypomagnesemia/hypocalcemia -Electrolytes level low due to diuresis. -Hold diuretics today.  Aggressive IV and oral replacement given.  Repeat labs in the afternoon. Recent Labs  Lab 05/02/21 1945 05/03/21 0304 05/04/21 0238 05/05/21 0403 05/07/21 0855  K 4.0  --  2.7* 4.4 2.6*  MG  --  1.2* 1.6*  --  0.8*   Recent Labs  Lab 05/02/21 1945 05/04/21 0238 05/05/21 0403 05/07/21 0855  CALCIUM 7.9* 7.6* 7.3* 5.5*   QTC prolongation -EKG on admission had QTC prolonged to 604  ms. -Repeat labs are stable.  Avoid further QTC prolonging medications.  Acute on chronic combined systolic and diastolic CHF -Echo from 66/06 with EF 45 to 30%, grade 1 diastolic dysfunction. -Presented with shortness of breath, anasarca, bilateral pleural effusion elevated BNP. -She was diuresed with Lasix IV 40 mg twice daily.  Swelling in the lower extremities much improved.  Swelling upper extremities gradually improving. -I will  stop Lasix today because of abnormally low electrolytes and risk of recurrence of cardiac arrest. -Net IO Since Admission: -3,622.54 mL [05/07/21 1101] -Continue to monitor for daily intake output, weight, blood pressure, BNP, renal function and electrolytes.  Acute DVT -CT abdomen and pelvis noted acute DVT involving left common iliac vein.  No evidence of PE on CTA chest. -Ultrasound lower extremities noted age-indeterminate DVT of the right common femoral vein and right femoral vein. -Risk factors include chronic immobility, recent steroid use. -Initially patient was started on IV heparin and later switched to Eliquis.   Acute right renal infarct  -CT abdomen showed acute right renal infarct with a known history of A. fib and a new finding of intra-abdominal DVT.  On admission, patient had red tinge in urine with hemoglobin noted in urinalysis.  Urine has cleared up now.-Echo from 2 weeks ago did not show any evidence of intracardiac shunt -Anticoagulation initiated.  Chest pain/palpitation -Patient initially had complaint of chest pain and palpitation at rest and was noted to have heart rate of 130.  At the facility, it resolved with metoprolol and sublingual nitroglycerin.  By the time of presentation to the ED, patient's symptoms had subsided. -Only mildly elevated troponin.  No complaint of chest pain since then.  Hypoalbuminemia Anasarca protein-calorie malnutrition -Dietitian consulted. -1 dose of IV albumin given on 11/5.  I will repeat albumin again today.  Bilateral pleural effusion -Large bilateral pleural effusion noted in CT chest. -Ultrasound thoracentesis of right side yielded 500 mL of fluid.  Fluid analysis showed hazy fluid with slightly elevated LDH.  Cytology did not show any malignant cells  Chronic gastritis -Patient has a history of nonbleeding gastric ulcers and chronic gastritis, had EGD done in February 2022.  Continue Protonix.  Spinal stenosis; chronic  back pain; chronic impaired mobility -No pain complaints on admission and no acute neurologic changes  -Continue supportive care, outpatient neurosurgery follow-up   -PT eval obtained.  Patient needs significant assistance even on sitting up to the edge of the bed.  She however does not want to go to a facility and wants to go home.  I discussed this with her husband on 1/5 and he agrees to patient's weeks of going home at discharge.  Urinary retention -Patient came in with Foley catheter for last 2 weeks after recent surgery.  It was removed on 11/5 with successful voiding trial.   Mobility: Limited mobility after last surgery Living condition: Previously living at home with her husband prior to previous hospitalization Goals of care:   Code Status: Full Code.   Nutritional status: Body mass index is 25.7 kg/m. Nutrition Problem: Inadequate oral intake Etiology: decreased appetite Signs/Symptoms: per patient/family report Diet:  Diet Order             Diet Heart Room service appropriate? Yes; Fluid consistency: Thin  Diet effective now                  DVT prophylaxis:  SCDsPlace and maintain sequential compression device Start: 05/03/21 1316 apixaban (ELIQUIS) tablet 10 mg  apixaban (ELIQUIS)  tablet 5 mg   Antimicrobials: None Fluid: None Consultants: None Family Communication: I spoke to patient's husband on the phone this morning.   Status is: Inpatient  Remains inpatient appropriate because: Had a cardiac arrest this morning.  Need to monitor for another 24 hours at least  Dispo: The patient is from: [facility] SN[facility]              Anticipated d/c is to: Patient states she does not want to return to rehab and wants to go home.  Probably next 1 to 2 days              Patient currently is not medically stable to d/c.   Difficult to place patient No     Infusions:   sodium chloride     albumin human     calcium gluconate     magnesium sulfate bolus IVPB       Scheduled Meds:  apixaban  10 mg Oral BID   Followed by   Derrill Memo ON 05/11/2021] apixaban  5 mg Oral BID   Chlorhexidine Gluconate Cloth  6 each Topical Daily   feeding supplement  1 Container Oral TID BM   multivitamin with minerals  1 tablet Oral Daily   pantoprazole  40 mg Oral Daily   potassium chloride  40 mEq Oral Q2H   sodium chloride flush  3 mL Intravenous Q12H    PRN meds: sodium chloride, acetaminophen, HYDROcodone-acetaminophen, lidocaine (PF), loperamide, LORazepam, melatonin, prochlorperazine, sodium chloride flush   Antimicrobials: Anti-infectives (From admission, onward)    None       Objective: Vitals:   05/07/21 0900 05/07/21 0930  BP: 119/66 123/73  Pulse: 93 96  Resp: 11 14  Temp: 97.6 F (36.4 C)   SpO2: 100% 98%    Intake/Output Summary (Last 24 hours) at 05/07/2021 1101 Last data filed at 05/07/2021 0551 Gross per 24 hour  Intake --  Output 500 ml  Net -500 ml   Filed Weights   05/05/21 2328 05/06/21 0559 05/07/21 0549  Weight: 64.3 kg 61.1 kg 61.7 kg   Weight change: -2.6 kg Body mass index is 25.7 kg/m.   Physical Exam: General exam: Pleasant elderly African-American female.  Improving anasarca.   Skin: No rashes, lesions or ulcers. HEENT: Atraumatic, normocephalic, no obvious bleeding Lungs: Clear to auscultation bilaterally.   CVS: Regular rate and rhythm, no murmur GI/Abd soft, nontender, nondistended, bowel sound present CNS: Alert, awake, oriented x3 after cardiac arrest Psychiatry: Mood appropriate Extremities: Bilateral lower extremity edema much better.  Upper extremity edema improving as well.  Data Review: I have personally reviewed the laboratory data and studies available.  F/u labs ordered Unresulted Labs (From admission, onward)     Start     Ordered   05/07/21 9735  Basic metabolic panel  Once-Timed,   TIMED       Question:  Specimen collection method  Answer:  IV Team=IV Team collect   05/07/21 1025    05/07/21 1025  Phosphorus  Add-on,   AD       Question:  Specimen collection method  Answer:  IV Team=IV Team collect   05/07/21 1024   05/07/21 0500  Magnesium  Tomorrow morning,   R       Question:  Specimen collection method  Answer:  Lab=Lab collect   05/06/21 1030   05/04/21 3299  Basic metabolic panel  Daily,   R      05/03/21 0143   05/04/21 0500  CBC  Daily,   R     See Hyperspace for full Linked Orders Report.   05/03/21 0935   05/03/21 1518  Acid Fast Culture with reflexed sensitivities  RELEASE UPON ORDERING,   STAT        05/03/21 1518            Signed, Terrilee Croak, MD [facility] 05/07/2021

## 2021-05-07 NOTE — Progress Notes (Signed)
Chaplain responded to page to Code Blue.  Pt alert and speaking.  No family present.  Chaplain standing by for support as needed.  [place]

## 2021-05-07 NOTE — Progress Notes (Signed)
Peripherally Inserted Central Catheter Placement  The IV Nurse has discussed with the patient and/or persons authorized to consent for the patient, the purpose of this procedure and the potential benefits and risks involved with this procedure.  The benefits include less needle sticks, lab draws from the catheter, and the patient may be discharged home with the catheter. Risks include, but not limited to, infection, bleeding, blood clot (thrombus formation), and puncture of an artery; nerve damage and irregular heartbeat and possibility to perform a PICC exchange if needed/ordered by physician.  Alternatives to this procedure were also discussed.  Bard Power PICC patient education guide, fact sheet on infection prevention and patient information card has been provided to patient /or left at bedside.    PICC Placement Documentation  PICC Triple Lumen 07/62/26 PICC Right Basilic 36 cm 0 cm (Active)  Indication for Insertion or Continuance of Line Vasoactive infusions 05/07/21 1759  Exposed Catheter (cm) 0 cm 05/07/21 1759  Site Assessment Dry;Clean;Intact 05/07/21 1759  Lumen #1 Status Flushed;Blood return noted;Saline locked 05/07/21 1759  Lumen #2 Status Flushed;Blood return noted;Saline locked 05/07/21 1759  Lumen #3 Status Flushed;Blood return noted;Saline locked 05/07/21 1759  Dressing Type Transparent 05/07/21 1759  Dressing Status Clean;Dry;Intact 05/07/21 1759  Antimicrobial disc in place? Yes 05/07/21 1759  Dressing Change Due 05/14/21 05/07/21 1759       Scotty Court 05/07/2021, 6:02 PM

## 2021-05-07 NOTE — Progress Notes (Signed)
Patient had a second episode of v-fib while nurse was at beside. Pt was conscious, but not responsive. Thready pulse palpable. Defibrillator prompted for a shock to be delivered. Pt received one shock and converted to sinus tach, then to sinus rhythm. Pt now receiving replacement electrolytes.  Raelyn Number, RN

## 2021-05-07 NOTE — Code Documentation (Signed)
  Patient Name: Debbie Barry   MRN: [id number]   Date of Birth/ Sex: 1951-01-03 , female      Admission Date: 05/02/2021  Attending Provider: Terrilee Croak, MD  Primary Diagnosis: DVT (deep venous thrombosis) ([place])    Indication: Pt was in her usual state of health until this AM, when she was noted to be unresponsive. Code blue was subsequently called. At the time of arrival on scene, ACLS protocol was underway.   Technical Description:  - CPR performance duration:  3 minutes  - Was defibrillation or cardioversion used? No   - Was external pacer placed? No  - Was patient intubated pre/post CPR? No   Medications Administered: Y = Yes; Blank = No Amiodarone    Atropine    Calcium    Epinephrine    Lidocaine    Magnesium    Norepinephrine    Phenylephrine    Sodium bicarbonate    Vasopressin     Post CPR evaluation:  - Final Status - Was patient successfully resuscitated ? Yes - What is current rhythm? Sinus tachycardia - What is current hemodynamic status? stable  Miscellaneous Information:  - Labs sent, including: Cbc, cmp, troponin, mg  - Primary team notified?  Yes  - Family Notified? No, straight to voicemail called x2  - Additional notes/ transfer status: N/a     Scarlett Presto, MD  05/07/2021, 8:37 AM

## 2021-05-07 NOTE — Consult Note (Signed)
[facility] Nurse Consult Note: Patient receiving care in [place] completed remotely after review of chart and flowsheet.  Reason for Consult:LLE wound Wound type: Left ankle venous ulcer Left LOWER EXTREMITY VENOUS DOPPLER ULTRASOUND completed on 03/07/21 and was negative.  Pressure Injury POA: NA Measurement: See flow sheet Wound bed: red with new green drainage.  Dressing procedure/placement/frequency: Clean the left ankle wound with NS then apply a cut to fit piece a Aquacel Advantage Kellie Simmering # [id number]) over the wound and secure with a foam dressing. Change the Aquacel twice daily.   Monitor the wound area(s) for worsening of condition such as: Signs/symptoms of infection, increase in size, development of or worsening of odor, development of pain, or increased pain at the affected locations.   Notify the medical team if any of these develop.  Thank you for the consult. [place] nurse will not follow at this time.   Please re-consult the [place] team if needed.  Cathlean Marseilles Tamala Julian, MSN, RN, [place], Lysle Pearl, [facility] Wound Treatment Associate Pager [phone]

## 2021-05-07 NOTE — Progress Notes (Signed)
eLink Physician-Brief Progress Note Patient Name: Thara Searing DOB: April 05, 1951 MRN: [id number]   Date of Service  05/07/2021  HPI/Events of Note  Patient with profound hypokalemia.  eICU Interventions  E-Link electrolyte replacement protocol with modifications ordered.        [name] Ivanell Deshotel 05/07/2021, 9:20 PM

## 2021-05-07 NOTE — Progress Notes (Signed)
  Echocardiogram 2D Echocardiogram has been performed.  Debbie Barry 05/07/2021, 2:14 PM

## 2021-05-07 NOTE — Progress Notes (Signed)
PT Cancellation Note  Patient Details Name: Debbie Barry MRN: [id number] DOB: 01/08/1951   Cancelled Treatment:    Reason Eval/Treat Not Completed: Medical issues which prohibited therapy. Pt with multiple episodes of Vtach/Vfib arrest this AM.    Shary Decamp [facility] 05/07/2021, 11:24 AM [place] Pager [phone] Office [phone]

## 2021-05-07 NOTE — Progress Notes (Signed)
Chaplain paged to code blue in pt's room. Pt's spouse was standing along the wall in pt's room observing medical team. Pt lying in bed awake. Chaplain introduced spiritual care and offered support. Mr Debbie Barry shared that his wife was supposed to go home today, but the doctor told him until the calcium and potassium are correct, his wife will have to stay. Chaplain acknowledged how frightening it is to experience such a significant medical event with your spouse. Mr Dwight shared that he and Arthurine are celebrating their 28th wedding anniversary today. Chaplain offered space for life and relationship review. Chaplain also offered space for Eileen to process feelings around the stress of the event she experienced today. [name] stated that she "could be better and could be worse." Chaplain validated the importance of perspective and also acknowledged that "hard is hard." Family expressed gratitude for visit.  Please page as further needs arise.  Donald Prose. Elyn Peers, M.Div. [facility] Chaplain Pager [phone] Office [phone]

## 2021-05-07 NOTE — Progress Notes (Signed)
Patient had another brief cardiac arrest - 2nd one this morning. Nurses noted V. tach in telemetry. Patient did not lose pulse but was feeling chest discomfort. 1 shock was delivered after which rhythm came back to normal sinus. She was in process of getting electrolytes replaced during this. This time, she did not lose her pulse or consciousness.  I called and discussed the case with cardiologist Dr. Percival Spanish.  We discussed about additional treatment options.  He recommended to expedite electrolyte replacement.  He will see her as a formal consult. I ordered for a transfer to cardiac ICU.

## 2021-05-08 ENCOUNTER — Inpatient Hospital Stay (HOSPITAL_COMMUNITY): Payer: Medicare HMO

## 2021-05-08 DIAGNOSIS — J90 Pleural effusion, not elsewhere classified: Secondary | ICD-10-CM | POA: Diagnosis not present

## 2021-05-08 DIAGNOSIS — N280 Ischemia and infarction of kidney: Secondary | ICD-10-CM | POA: Diagnosis not present

## 2021-05-08 DIAGNOSIS — E8809 Other disorders of plasma-protein metabolism, not elsewhere classified: Secondary | ICD-10-CM

## 2021-05-08 DIAGNOSIS — I2510 Atherosclerotic heart disease of native coronary artery without angina pectoris: Secondary | ICD-10-CM

## 2021-05-08 DIAGNOSIS — I4721 Torsades de pointes: Secondary | ICD-10-CM

## 2021-05-08 DIAGNOSIS — I469 Cardiac arrest, cause unspecified: Secondary | ICD-10-CM | POA: Diagnosis not present

## 2021-05-08 DIAGNOSIS — I4729 Other ventricular tachycardia: Secondary | ICD-10-CM | POA: Diagnosis not present

## 2021-05-08 DIAGNOSIS — R9431 Abnormal electrocardiogram [ECG] [EKG]: Secondary | ICD-10-CM | POA: Diagnosis not present

## 2021-05-08 LAB — COMPREHENSIVE METABOLIC PANEL
ALT: 16 U/L (ref 0–44)
AST: 29 U/L (ref 15–41)
Albumin: 1.7 g/dL — ABNORMAL LOW (ref 3.5–5.0)
Alkaline Phosphatase: 96 U/L (ref 38–126)
Anion gap: 6 (ref 5–15)
BUN: <5 mg/dL — ABNORMAL LOW (ref 8–23)
CO2: 29 mmol/L (ref 22–32)
Calcium: 7.4 mg/dL — ABNORMAL LOW (ref 8.9–10.3)
Chloride: 99 mmol/L (ref 98–111)
Creatinine, Ser: 0.48 mg/dL (ref 0.44–1.00)
GFR, Estimated: >60 mL/min (ref >60)
Glucose, Bld: 153 mg/dL — ABNORMAL HIGH (ref 70–99)
Potassium: 2.5 mmol/L — CL (ref 3.5–5.1)
Sodium: 134 mmol/L — ABNORMAL LOW (ref 135–145)
Total Bilirubin: 0.8 mg/dL (ref 0.3–1.2)
Total Protein: 4 g/dL — ABNORMAL LOW (ref 6.5–8.1)

## 2021-05-08 LAB — RETICULOCYTES
Immature Retic Fract: 14.5 % (ref 2.3–15.9)
RBC.: 2.83 MIL/uL — ABNORMAL LOW (ref 3.87–5.11)
Retic Count, Absolute: 64.8 10*3/uL (ref 19.0–186.0)
Retic Ct Pct: 2.3 % (ref 0.4–3.1)

## 2021-05-08 LAB — BASIC METABOLIC PANEL
Anion gap: 6 (ref 5–15)
Anion gap: 7 (ref 5–15)
BUN: <5 mg/dL — ABNORMAL LOW (ref 8–23)
BUN: <5 mg/dL — ABNORMAL LOW (ref 8–23)
CO2: 28 mmol/L (ref 22–32)
CO2: 27 mmol/L (ref 22–32)
Calcium: 7.4 mg/dL — ABNORMAL LOW (ref 8.9–10.3)
Calcium: 7.7 mg/dL — ABNORMAL LOW (ref 8.9–10.3)
Chloride: 97 mmol/L — ABNORMAL LOW (ref 98–111)
Chloride: 99 mmol/L (ref 98–111)
Creatinine, Ser: 0.47 mg/dL (ref 0.44–1.00)
Creatinine, Ser: 0.48 mg/dL (ref 0.44–1.00)
GFR, Estimated: >60 mL/min (ref >60)
GFR, Estimated: >60 mL/min (ref >60)
Glucose, Bld: 159 mg/dL — ABNORMAL HIGH (ref 70–99)
Glucose, Bld: 157 mg/dL — ABNORMAL HIGH (ref 70–99)
Potassium: 3.1 mmol/L — ABNORMAL LOW (ref 3.5–5.1)
Potassium: 4.8 mmol/L (ref 3.5–5.1)
Sodium: 131 mmol/L — ABNORMAL LOW (ref 135–145)
Sodium: 133 mmol/L — ABNORMAL LOW (ref 135–145)

## 2021-05-08 LAB — MAGNESIUM
Magnesium: 2 mg/dL (ref 1.7–2.4)
Magnesium: 2.2 mg/dL (ref 1.7–2.4)

## 2021-05-08 LAB — CBC
HCT: 25.2 % — ABNORMAL LOW (ref 36.0–46.0)
Hemoglobin: 9.2 g/dL — ABNORMAL LOW (ref 12.0–15.0)
MCH: 33.7 pg (ref 26.0–34.0)
MCHC: 36.5 g/dL — ABNORMAL HIGH (ref 30.0–36.0)
MCV: 92.3 fL (ref 80.0–100.0)
Platelets: 256 10*3/uL (ref 150–400)
RBC: 2.73 MIL/uL — ABNORMAL LOW (ref 3.87–5.11)
RDW: 16 % — ABNORMAL HIGH (ref 11.5–15.5)
WBC: 8 10*3/uL (ref 4.0–10.5)
nRBC: 0 % (ref 0.0–0.2)

## 2021-05-08 LAB — POTASSIUM: Potassium: 4.9 mmol/L (ref 3.5–5.1)

## 2021-05-08 LAB — COOXEMETRY PANEL
Carboxyhemoglobin: 0.7 % (ref 0.5–1.5)
Methemoglobin: 0.8 % (ref 0.0–1.5)
O2 Saturation: 66.7 %
Total hemoglobin: 9.9 g/dL — ABNORMAL LOW (ref 12.0–16.0)

## 2021-05-08 LAB — TRANSFERRIN: Transferrin: <70 mg/dL — ABNORMAL LOW (ref 192–382)

## 2021-05-08 LAB — C DIFFICILE (CDIFF) QUICK SCRN (NO PCR REFLEX)
C Diff antigen: NEGATIVE
C Diff interpretation: NOT DETECTED
C Diff toxin: NEGATIVE

## 2021-05-08 LAB — LACTIC ACID, PLASMA: Lactic Acid, Venous: 1.5 mmol/L (ref 0.5–1.9)

## 2021-05-08 LAB — FIBRINOGEN: Fibrinogen: 395 mg/dL (ref 210–475)

## 2021-05-08 LAB — PHOSPHORUS: Phosphorus: 3.1 mg/dL (ref 2.5–4.6)

## 2021-05-08 LAB — VITAMIN D 25 HYDROXY (VIT D DEFICIENCY, FRACTURES): Vit D, 25-Hydroxy: 87.74 ng/mL (ref 30–100)

## 2021-05-08 LAB — FOLATE: Folate: 18.6 ng/mL (ref >5.9)

## 2021-05-08 IMAGING — DX DG CHEST 1V PORT
1 series · 1 of 1 positions shown · non-contrast
Comparison: Portable exam [YT] hours compared to [DATE]

CLINICAL DATA: Of breath

EXAM:
PORTABLE CHEST 1 VIEW

[chest]
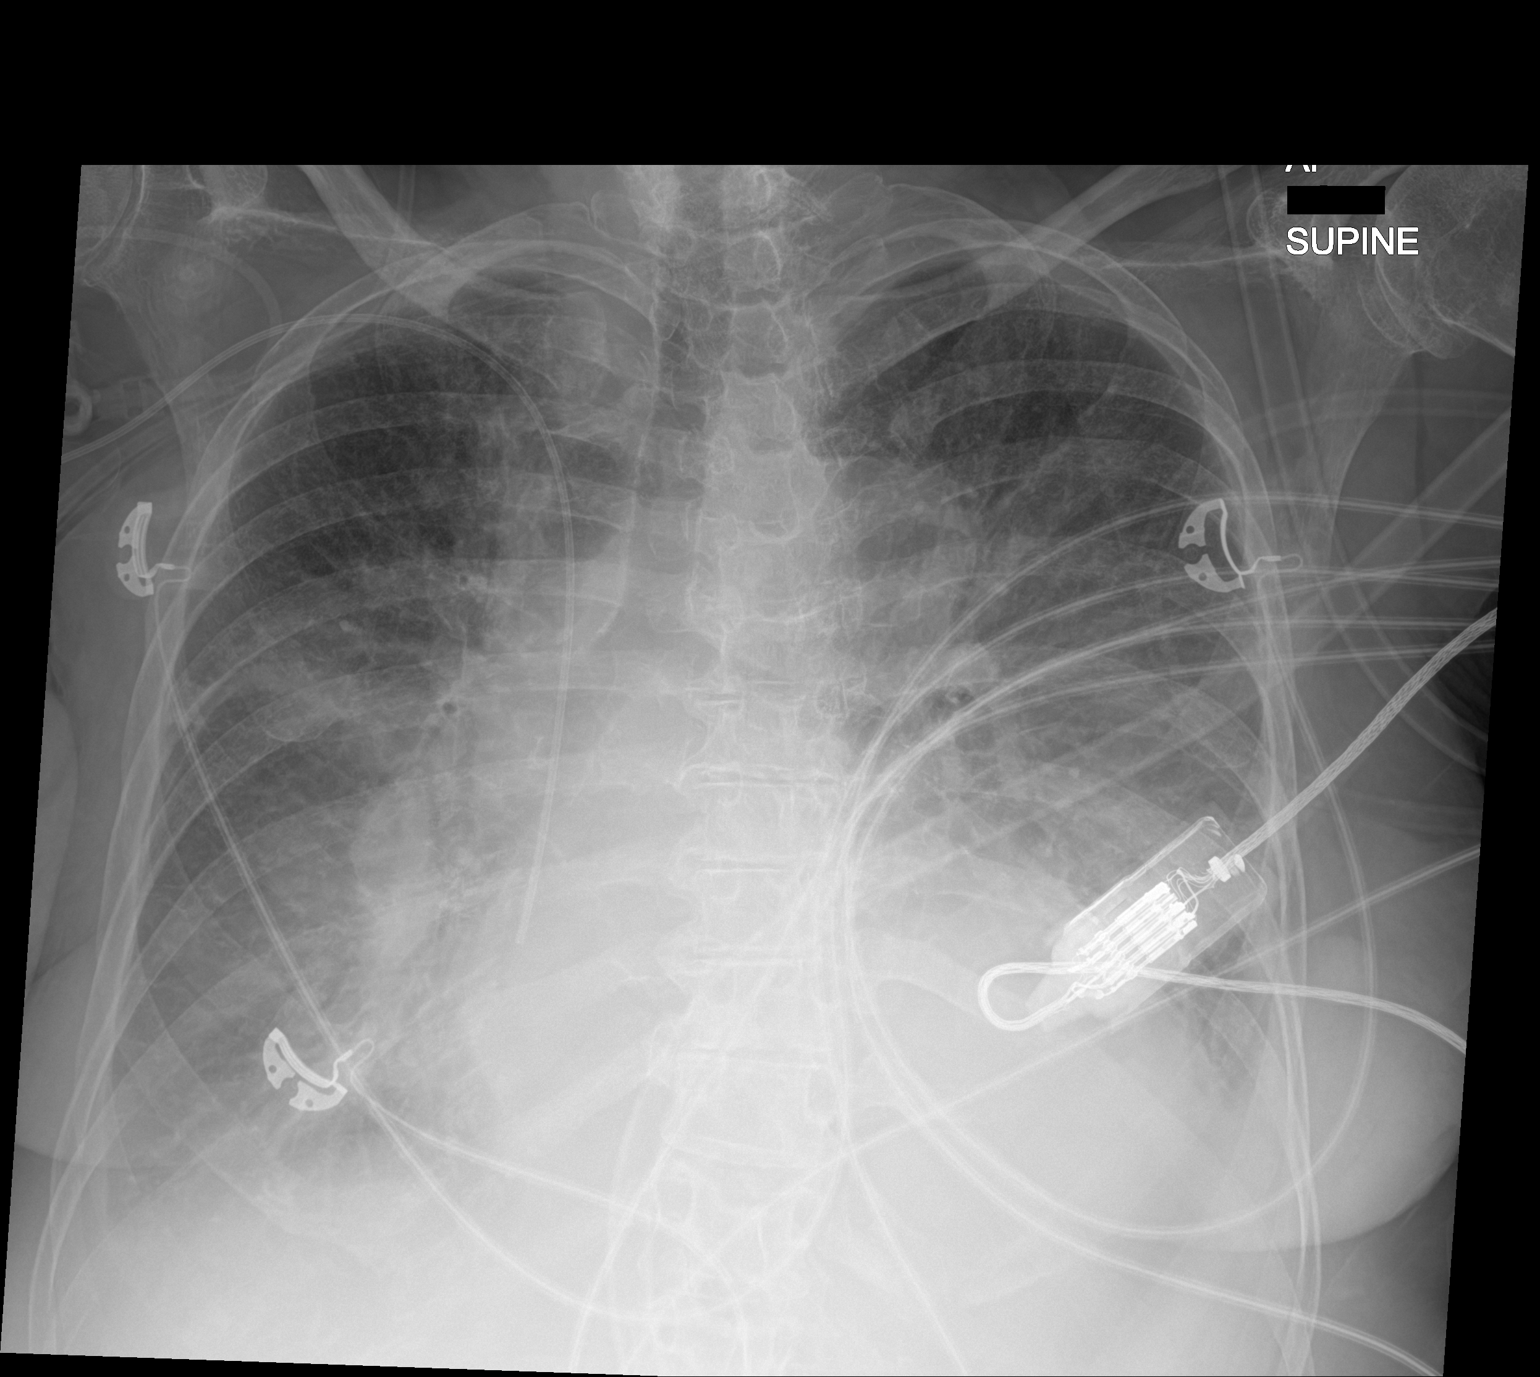

[1 of 1 positions shown; findings below may reference images not displayed]

FINDINGS: RIGHT arm PICC line with tip projecting over cavoatrial junction.

Enlargement of cardiac silhouette with pulmonary vascular
congestion.

Diffuse pulmonary infiltrate consistent with pulmonary edema.

Layering pleural effusions, better appreciated on preceding lateral
decubitus radiographs.

BILATERAL lower lobe atelectasis versus consolidation without
pneumothorax.

Questioned nodular density on prior exam not visualized.

Osseous demineralization.
IMPRESSION: Enlargement of cardiac silhouette with pulmonary vascular congestion
and questionable perihilar edema.

Layering BILATERAL pleural effusions.

Additional atelectasis versus consolidation of lower lobes.

## 2021-05-08 MED ORDER — SPIRONOLACTONE 12.5 MG HALF TABLET
12.5000 mg | ORAL_TABLET | Freq: Every day | ORAL | Status: DC
  Administered 2021-05-08 – 2021-05-09 (×2): 12.5 mg via ORAL
  Filled 2021-05-08 (×2): qty 1

## 2021-05-08 MED ORDER — ORAL CARE MOUTH RINSE
15.0000 mL | Freq: Two times a day (BID) | OROMUCOSAL | Status: DC
  Administered 2021-05-08 – 2021-05-21 (×17): 15 mL via OROMUCOSAL

## 2021-05-08 MED ORDER — SUCRALFATE 1 GM/10ML PO SUSP
1.0000 g | Freq: Three times a day (TID) | ORAL | Status: DC
  Administered 2021-05-08 – 2021-05-11 (×11): 1 g via ORAL
  Filled 2021-05-08 (×11): qty 10

## 2021-05-08 MED ORDER — FUROSEMIDE 10 MG/ML IJ SOLN
INTRAMUSCULAR | Status: AC
  Administered 2021-05-08: 40 mg via INTRAVENOUS
  Filled 2021-05-08: qty 4

## 2021-05-08 MED ORDER — MEGESTROL ACETATE 400 MG/10ML PO SUSP
400.0000 mg | Freq: Two times a day (BID) | ORAL | Status: DC
  Administered 2021-05-08 – 2021-05-22 (×28): 400 mg via ORAL
  Filled 2021-05-08 (×30): qty 10

## 2021-05-08 MED ORDER — ALBUMIN HUMAN 5 % IV SOLN
12.5000 g | Freq: Once | INTRAVENOUS | Status: AC
  Administered 2021-05-08: 12.5 g via INTRAVENOUS
  Filled 2021-05-08: qty 250

## 2021-05-08 MED ORDER — THIAMINE HCL 100 MG/ML IJ SOLN
250.0000 mg | Freq: Three times a day (TID) | INTRAVENOUS | Status: AC
  Administered 2021-05-10 – 2021-05-13 (×8): 250 mg via INTRAVENOUS
  Filled 2021-05-08 (×9): qty 2.5

## 2021-05-08 MED ORDER — POTASSIUM PHOSPHATES 15 MMOLE/5ML IV SOLN
30.0000 mmol | Freq: Once | INTRAVENOUS | Status: AC
  Administered 2021-05-08: 30 mmol via INTRAVENOUS
  Filled 2021-05-08: qty 10

## 2021-05-08 MED ORDER — COLESTIPOL HCL 1 G PO TABS
1.0000 g | ORAL_TABLET | Freq: Two times a day (BID) | ORAL | Status: DC
  Administered 2021-05-08 – 2021-05-09 (×3): 1 g via ORAL
  Filled 2021-05-08 (×4): qty 1

## 2021-05-08 MED ORDER — THIAMINE HCL 100 MG/ML IJ SOLN
500.0000 mg | Freq: Three times a day (TID) | INTRAVENOUS | Status: AC
  Administered 2021-05-08 – 2021-05-10 (×6): 500 mg via INTRAVENOUS
  Filled 2021-05-08 (×6): qty 5

## 2021-05-08 MED ORDER — POTASSIUM CHLORIDE 10 MEQ/50ML IV SOLN
10.0000 meq | INTRAVENOUS | Status: AC
  Administered 2021-05-08 (×4): 10 meq via INTRAVENOUS
  Filled 2021-05-08 (×6): qty 50

## 2021-05-08 MED ORDER — THIAMINE HCL 100 MG PO TABS: 100.0000 mg | ORAL_TABLET | Freq: Every day | ORAL | Status: DC

## 2021-05-08 MED ORDER — FUROSEMIDE 10 MG/ML IJ SOLN: 40.0000 mg | Freq: Once | INTRAMUSCULAR | Status: AC

## 2021-05-08 MED ORDER — MAGNESIUM SULFATE 2 GM/50ML IV SOLN
2.0000 g | Freq: Once | INTRAVENOUS | Status: AC
  Administered 2021-05-08: 2 g via INTRAVENOUS
  Filled 2021-05-08: qty 50

## 2021-05-08 NOTE — Progress Notes (Signed)
PCCM INTERVAL PROGRESS NOTE   Called to bedside for tachycardia after repositioning patient. Sinus tach in the 140s. Based on echo findings I suspected she me be intravascularly dry from third spacing related to hypoalbuminemia. Albumin ordered. Rates improved to sinus ~ 120. About 3/4 of the was through the albumin administration she again became tachycardic to the 140s again and became dyspneic. JVD was then present. 40mg  lasix given with minimal output. Point of care echo done by Dr. Lynetta Mare concerning for LV function worse than on echo yesterday. Nutritional? High dose thiamine ordered.   Will continue to monitor hemodynamics and respiratory status closely.       Georgann Housekeeper, AGACNP-BC [facility]  See [place] for personal pager PCCM on call pager [phone] until 7pm. Please call [place] 7p-7a. [phone]  05/08/2021 5:29 PM

## 2021-05-08 NOTE — Progress Notes (Signed)
Cardiology Progress Note  Patient ID: Debbie Barry MRN: [id number] DOB: [birth year] Date of Encounter: 05/08/2021  Primary Cardiologist: Evalina Field, MD  Subjective   Chief Complaint: none.   HPI: No further ventricular tachycardia.  Potassium of 3.1.  Magnesium 2.0.  QTc down to 520.  ROS:  All other ROS reviewed and negative. Pertinent positives noted in the HPI.     Inpatient Medications  Scheduled Meds:  apixaban  10 mg Oral BID   Followed by   Derrill Memo ON 05/11/2021] apixaban  5 mg Oral BID   Chlorhexidine Gluconate Cloth  6 each Topical Daily   feeding supplement  1 Container Oral TID BM   multivitamin with minerals  1 tablet Oral Daily   pantoprazole  40 mg Oral Daily   potassium chloride  40 mEq Oral TID   sodium chloride flush  10-40 mL Intracatheter Q12H   sodium chloride flush  3 mL Intravenous Q12H   Continuous Infusions:  sodium chloride     isoproterenol (ISUPREL) infusion 1 mcg/min (05/08/21 0428)   PRN Meds: sodium chloride, acetaminophen, HYDROcodone-acetaminophen, lidocaine (PF), loperamide, melatonin, sodium chloride flush, sodium chloride flush   Vital Signs   Vitals:   05/08/21 0346 05/08/21 0400 05/08/21 0500 05/08/21 0600  BP:  (!) 97/57 97/62 (!) 98/59  Pulse:  (!) 115 (!) 108 (!) 112  Resp:  11 11 12   Temp: 97.7 F (36.5 C)     TempSrc: Oral     SpO2:  100% 100% 100%  Weight:      Height:        Intake/Output Summary (Last 24 hours) at 05/08/2021 0709 Last data filed at 05/08/2021 0428 Gross per 24 hour  Intake 838.08 ml  Output --  Net 838.08 ml   Last 3 Weights 05/07/2021 05/06/2021 05/05/2021  Weight (lbs) 136 lb 0.4 oz 134 lb 11.2 oz 141 lb 12.1 oz  Weight (kg) 61.7 kg 61.1 kg 64.3 kg      Telemetry  Overnight telemetry shows sinus tachycardia heart rate 120, which I personally reviewed.   ECG  The most recent ECG shows sinus tachycardia, QTC 527, nonspecific ST-T changes, which I personally reviewed.   Physical Exam    Vitals:   05/08/21 0346 05/08/21 0400 05/08/21 0500 05/08/21 0600  BP:  (!) 97/57 97/62 (!) 98/59  Pulse:  (!) 115 (!) 108 (!) 112  Resp:  11 11 12   Temp: 97.7 F (36.5 C)     TempSrc: Oral     SpO2:  100% 100% 100%  Weight:      Height:        Intake/Output Summary (Last 24 hours) at 05/08/2021 0709 Last data filed at 05/08/2021 0428 Gross per 24 hour  Intake 838.08 ml  Output --  Net 838.08 ml    Last 3 Weights 05/07/2021 05/06/2021 05/05/2021  Weight (lbs) 136 lb 0.4 oz 134 lb 11.2 oz 141 lb 12.1 oz  Weight (kg) 61.7 kg 61.1 kg 64.3 kg    Body mass index is 25.7 kg/m.   General: Well nourished, well developed, in no acute distress Head: Atraumatic, normal size  Eyes: PEERLA, EOMI  Neck: Supple, no JVD Endocrine: No thryomegaly Cardiac: Normal S1, S2; tachycardia noted, no murmurs Lungs: Crackles at the lung bases Abd: Soft, nontender, no hepatomegaly  Ext: No edema, pulses 2+ Musculoskeletal: No deformities Skin: Warm and dry, no rashes   Neuro: Alert and oriented to person, place, time, and situation, CNII-XII grossly intact, no  focal deficits  Psych: Normal mood and affect   Labs  High Sensitivity Troponin:   Recent Labs  Lab 05/02/21 1945 05/03/21 0304  TROPONINIHS 28* 32*     Cardiac EnzymesNo results for input(s): TROPONINI in the last 168 hours. No results for input(s): TROPIPOC in the last 168 hours.  Chemistry Recent Labs  Lab 05/02/21 1945 05/04/21 0238 05/07/21 0855 05/07/21 1825 05/07/21 2343 05/08/21 0520  NA 132*   < > 138 133* 134* 131*  K 4.0   < > 2.6* <2.0* 2.5* 3.1*  CL 103   < > 108 97* 99 97*  CO2 21*   < > 24 27 29 28   GLUCOSE 90   < > 74 206* 153* 159*  BUN 13   < > <5* <5* <5* <5*  CREATININE 0.65   < > 0.38* 0.50 0.48 0.47  CALCIUM 7.9*   < > 5.5* 7.4* 7.4* 7.4*  PROT 4.4*  --  3.4*  --  4.0*  --   ALBUMIN 1.5*  --  <1.5*  --  1.7*  --   AST 25  --  50*  --  29  --   ALT 17  --  11  --  16  --   ALKPHOS 128*  --  89  --   96  --   BILITOT 0.6  --  0.6  --  0.8  --   GFRNONAA >60   < > >60 >60 >60 >60  ANIONGAP 8   < > 6 9 6 6    < > = values in this interval not displayed.    Hematology Recent Labs  Lab 05/05/21 0403 05/07/21 0855 05/08/21 0520  WBC 6.9 8.7 8.0  RBC 3.22* 2.97* 2.73*  HGB 10.7* 10.2* 9.2*  HCT 29.9* 27.6* 25.2*  MCV 92.9 92.9 92.3  MCH 33.2 34.3* 33.7  MCHC 35.8 37.0* 36.5*  RDW 15.4 16.4* 16.0*  PLT 310 300 256   BNP Recent Labs  Lab 05/03/21 0304 05/07/21 0900  BNP 1,679.7* 482.9*    DDimer No results for input(s): DDIMER in the last 168 hours.   Radiology  DG Chest Left Decubitus  Result Date: 05/07/2021 CLINICAL DATA:  Possible pneumothorax. EXAM: CHEST - LEFT DECUBITUS COMPARISON:  Chest radiograph done earlier the same day. CT chest 05/03/2021. FINDINGS: No right-sided pneumothorax. Layering bilateral pleural effusions. Nodular density is seen in the right midlung zone. Correlation with CT 05/03/2021 is challenging due to a large pleural effusion and collapse/consolidation in the right lung on that study. IMPRESSION: 1. No right pneumothorax. 2. Nodular density in the right midlung zone. Recommend attention on follow-up. 3. Bilateral pleural effusions. Electronically Signed   By: Lorin Picket M.D.   On: 05/07/2021 13:15   DG CHEST PORT 1 VIEW  Result Date: 05/07/2021 CLINICAL DATA:  Cardiac arrest. EXAM: PORTABLE CHEST 1 VIEW COMPARISON:  05/03/2021 and CT chest 05/03/2021. FINDINGS: Trachea is midline. Heart is enlarged, stable. Thoracic aorta is calcified. Defibrillator pad overlies the mid chest. There is a vertically oriented linear density along the apical and lateral aspect of the right hemithorax but there may be parenchymal markings extending beyond margin. Interstitial prominence and indistinctness with bilateral pleural effusions. Left lower lobe collapse/consolidation. Degenerative changes in the shoulders. IMPRESSION: 1. Possible artifact versus small  pneumothorax in the right hemithorax. Recommend left lateral decubitus view in further evaluation, as clinically indicated. These results will be called to the ordering clinician or representative by the Radiologist Assistant,  and communication documented in the PACS or [employer]. 2. Congestive heart failure. 3. Left lower lobe collapse/consolidation. Difficult to exclude aspiration. Electronically Signed   By: Lorin Picket M.D.   On: 05/07/2021 08:55   ECHOCARDIOGRAM COMPLETE  Result Date: 05/07/2021    ECHOCARDIOGRAM REPORT   Patient Name:   Debbie Barry Date of Exam: 05/07/2021 Medical Rec #:  [id number]          Height:       61.0 in Accession #:    [id number]         Weight:       136.0 lb Date of Birth:  07-25-1950         BSA:          1.603 m Patient Age:    70 years           BP:           126/66 mmHg Patient Gender: F                  HR:           93 bpm. Exam Location:  Inpatient Procedure: 2D Echo, Cardiac Doppler and Color Doppler Indications:    Cardiac arrest  History:        Patient has prior history of Echocardiogram examinations, most                 recent 04/21/2021. CHF, PAD, Arrythmias:Cardiac Arrest and                 Ventricular Fibrillation, Signs/Symptoms:Chest Pain and Large                 pleural effusion; Risk Factors:Hypertension.  Sonographer:    Dustin Flock RDCS Referring Phys: [id number] [place]  1. Septal and apical hypokinesis . Left ventricular ejection fraction, by estimation, is 40 to 45%. The left ventricle has mildly decreased function. The left ventricle demonstrates regional wall motion abnormalities (see scoring diagram/findings for description). Left ventricular diastolic parameters were normal.  2. Right ventricular systolic function is normal. The right ventricular size is normal.  3. There is a persistent pleurs effusion and trivial pericardial effusion Also appears to be prominent epicardial fat seen over RV. Can consider f/u  CT chest if clinically indicated to further assess. The pericardial effusion is lateral to the left ventricle.  4. The mitral valve is abnormal. Trivial mitral valve regurgitation. No evidence of mitral stenosis.  5. The aortic valve is tricuspid. There is mild calcification of the aortic valve. Aortic valve regurgitation is not visualized. Mild aortic valve sclerosis is present, with no evidence of aortic valve stenosis.  6. The inferior vena cava is normal in size with greater than 50% respiratory variability, suggesting right atrial pressure of 3 mmHg. FINDINGS  Left Ventricle: Septal and apical hypokinesis. Left ventricular ejection fraction, by estimation, is 40 to 45%. The left ventricle has mildly decreased function. The left ventricle demonstrates regional wall motion abnormalities. The left ventricular internal cavity size was normal in size. There is no left ventricular hypertrophy. Left ventricular diastolic parameters were normal. Right Ventricle: The right ventricular size is normal. No increase in right ventricular wall thickness. Right ventricular systolic function is normal. Left Atrium: Left atrial size was normal in size. Right Atrium: Right atrial size was normal in size. Pericardium: There is a persistent pleurs effusion and trivial pericardial effusion Also appears to be prominent epicardial fat seen over RV. Can consider  f/u CT chest if clinically indicated to further assess. Trivial pericardial effusion is present. The pericardial effusion is lateral to the left ventricle. Mitral Valve: The mitral valve is abnormal. Trivial mitral valve regurgitation. No evidence of mitral valve stenosis. Tricuspid Valve: The tricuspid valve is normal in structure. Tricuspid valve regurgitation is trivial. No evidence of tricuspid stenosis. Aortic Valve: The aortic valve is tricuspid. There is mild calcification of the aortic valve. Aortic valve regurgitation is not visualized. Mild aortic valve sclerosis is  present, with no evidence of aortic valve stenosis. Pulmonic Valve: The pulmonic valve was normal in structure. Pulmonic valve regurgitation is not visualized. No evidence of pulmonic stenosis. Aorta: The aortic root is normal in size and structure. Venous: The inferior vena cava is normal in size with greater than 50% respiratory variability, suggesting right atrial pressure of 3 mmHg. IAS/Shunts: No atrial level shunt detected by color flow Doppler.  LEFT VENTRICLE PLAX 2D LVIDd:         4.50 cm     Diastology LVIDs:         2.90 cm     LV e' medial:    4.57 cm/s LV PW:         1.00 cm     LV E/e' medial:  10.0 LV IVS:        1.00 cm     LV e' lateral:   5.77 cm/s LVOT diam:     2.00 cm     LV E/e' lateral: 7.9 LV SV:         52 LV SV Index:   32 LVOT Area:     3.14 cm  LV Volumes (MOD) LV vol d, MOD A4C: 73.0 ml LV vol s, MOD A4C: 35.5 ml LV SV MOD A4C:     73.0 ml RIGHT VENTRICLE RV Basal diam:  2.70 cm RV S prime:     11.10 cm/s TAPSE (M-mode): 1.5 cm LEFT ATRIUM             Index        RIGHT ATRIUM           Index LA diam:        2.30 cm 1.43 cm/m   RA Area:     10.50 cm LA Vol (A2C):   23.9 ml 14.91 ml/m  RA Volume:   20.30 ml  12.66 ml/m LA Vol (A4C):   39.9 ml 24.89 ml/m LA Biplane Vol: 31.6 ml 19.71 ml/m  AORTIC VALVE LVOT Vmax:   103.00 cm/s LVOT Vmean:  70.200 cm/s LVOT VTI:    0.164 m  AORTA Ao Root diam: 2.70 cm MITRAL VALVE MV Area (PHT): 5.62 cm    SHUNTS MV Decel Time: 135 msec    Systemic VTI:  0.16 m MV E velocity: 45.80 cm/s  Systemic Diam: 2.00 cm MV A velocity: 71.10 cm/s MV E/A ratio:  0.64 Jenkins Rouge MD Electronically signed by Jenkins Rouge MD Signature Date/Time: 05/07/2021/2:32:42 PM    Final    [place] EKG SITE RITE  Result Date: 05/07/2021 If Site Rite image not attached, placement could not be confirmed due to current cardiac rhythm.  US Abdomen Limited RUQ (LIVER/GB)  Result Date: 05/07/2021 CLINICAL DATA:  Ascites on CT scan dated 05/03/2021 EXAM: ULTRASOUND ABDOMEN  LIMITED RIGHT UPPER QUADRANT COMPARISON:  None. FINDINGS: Gallbladder: No gallstones. Mild focal thickening of the gallbladder wall measuring up to 4 mm. The technologist reported positive sonographic Murphy's sign. Common bile duct: Diameter: 2  mm Liver: No focal lesion identified. Within normal limits in parenchymal echogenicity. Portal vein is patent on color Doppler imaging with normal direction of blood flow towards the liver. Other: Mild ascites IMPRESSION: 1.  Mild ascites. 2. Mild focal gallbladder wall thickening and positive sonographic Murphy sign, nonspecific findings. This may be sequela of chronic ascites and/or volume overload. Clinical correlation is suggested. Electronically Signed   By: Keane Police D.O.   On: 05/07/2021 15:54    Cardiac Studies  TTE 05/07/2021  1. Septal and apical hypokinesis . Left ventricular ejection fraction, by  estimation, is 40 to 45%. The left ventricle has mildly decreased  function. The left ventricle demonstrates regional wall motion  abnormalities (see scoring diagram/findings for  description). Left ventricular diastolic parameters were normal.   2. Right ventricular systolic function is normal. The right ventricular  size is normal.   3. There is a persistent pleurs effusion and trivial pericardial effusion  Also appears to be prominent epicardial fat seen over RV. Can consider f/u  CT chest if clinically indicated to further assess. The pericardial  effusion is lateral to the left  ventricle.   4. The mitral valve is abnormal. Trivial mitral valve regurgitation. No  evidence of mitral stenosis.   5. The aortic valve is tricuspid. There is mild calcification of the  aortic valve. Aortic valve regurgitation is not visualized. Mild aortic  valve sclerosis is present, with no evidence of aortic valve stenosis.   6. The inferior vena cava is normal in size with greater than 50%  respiratory variability, suggesting right atrial pressure of 3 mmHg.    Patient Profile  70 year old female with history of hypertension, spinal stenosis iron overload, GI bleed who was admitted on 05/03/2021 for chest pain and anasarca.  Found to have new right renal infarct.  Cardiology consulted on 05/07/2021 after polymorphic ventricular tachycardia arrest.  Assessment & Plan   #Polymorphic ventricular tachycardia #Cardiac arrest #QTc prolongation -Placed on isoproterenol overnight.  QTc interval down to 527 ms.  It was around 600 at the time of her arrest. -Her cardiac arrest also coincided with potassium less than 3 as well as a magnesium of 0.8. -Echo shows EF roughly 45% with mention of wall motion normality but to me this is not apparent. -Her cardiac arrest was likely driven by QTC prolongation in the setting of bradycardia and a PVC.  This was polymorphic ventricular tachycardia (torsades).  -Electrolytes much improved today.  No further VT.  We will stop isopryl.  -Remains grossly volume overloaded.  Needs diuresis.  Would wait until potassium is above 4 magnesium above 2.  I have added Aldactone and this can help raise her potassium while we are waiting. -Regarding ischemia evaluation I believe she could be considered for coronary CTA at a later date.  Her polymorphic ventricular tachycardia was clearly related to electrolyte derangement.  She did complain of chest pain prior to admission but she is basically bedridden.  Her pain did improve with nitroglycerin.  I believe we will consider coronary CTA closer to discharge.  #Chest Pain #Mild coronary calcifications -She was admitted with chest pain and had troponins that were negative and flat.  EKG shows nonspecific ST-T changes.  They have been unchanged as prior. -Echo with EF roughly 45%.  There is mention of septal hypokinesis but to me this appears normal. -I do not believe CAD explains her cardiac arrest.  This is likely PMVT in the setting of profound hypomagnesemia. -I think she would  be a  good candidate given minimal to mild coronary calcifications in LAD distribution seen on CT PE study for coronary CTA.  We will likely pursue this closer to discharge.   -Again we will have to consider that she is bedridden from weakness.  She is basically living in a skilled nursing facility.  I do not believe she is a great candidate for aggressive cardiovascular care. -I wonder if her spinal stenosis and weakness needs to be worked up further.  Apparently she was not deemed a candidate for surgery per neurosurgical evaluation during prior admissions.  #Volume overload #Anasarca  #Hypoalbuminemia -Her volume overload appears to be related to hypoalbuminemia.  She is still volume overloaded.  Awaiting aggressive diuresis until potassium has been above 4.  I have added Aldactone as this will allow for diuresis and raise her potassium. -Liver ultrasound without any evidence of cirrhosis.  Inflammatory markers are negative.  Does not appear to be any rheumatologic or liver disease. -Her echocardiogram shows an EF of 45%.  She did have cardiac arrest.  Regardless this is close to baseline.  She has a collapsing IVC.  To me her volume overload is related to anasarca and congestive heart failure.  #Acute Renal Infarct  #R CFA DVT -Found to have acute right renal infarct on CT.  Unclear etiology.  No A. fib documented. -Echo without vegetation.  No symptoms to suggest endocarditis. -She is on anticoagulation for DVT.  I think it would be unusual for have a cardiac shunt to explain this. -More importantly she is basically bedridden from what she describes as weakness from her spinal stenosis.  I believe aggressive work-up is likely not indicated.  She has several indicators of poor outcomes including severe hypoalbuminemia and gross anasarca.  I believe these conditions need to be improved before she is worked up aggressively. -For now would continue anticoagulation.  For questions or updates, please  contact [place] Please consult [email or web address] for contact info under    CRITICAL CARE Performed by: [place] Bells T O'Neal  Total critical care time: 45 minutes. Critical care time was exclusive of separately billable procedures and treating other patients. Critical care was necessary to treat or prevent imminent or life-threatening deterioration. Critical care was time spent personally by me on the following activities: development of treatment plan with patient and/or surrogate as well as nursing, discussions with consultants, evaluation of patient's response to treatment, examination of patient, obtaining history from patient or surrogate, ordering and performing treatments and interventions, ordering and review of laboratory studies, ordering and review of radiographic studies, pulse oximetry and re-evaluation of patient's condition.    Signed, Addison Naegeli. Audie Box, MD, [place]  05/08/2021 7:09 AM

## 2021-05-08 NOTE — Progress Notes (Signed)
NAME:  Debbie Barry, MRN:  [id number], DOB:  03-04-1951, LOS: 5 ADMISSION DATE:  05/02/2021, CONSULTATION DATE:  11/7 REFERRING MD:  Dr. Pietro Cassis, CHIEF COMPLAINT:  VT/VF   History of Present Illness:  70 year old female with past medical history as below, which is significant for hypertension, spinal stenosis, gastric ulcers, and fatty liver.  She was recently admitted due to lower extremity weakness and was found to have spinal stenosis for which medical management was recommended.  She was discharged to SNF where course was relatively uneventful.  She presented again to [facility] emergency department on 11/3 with complaints of chest pain and palpitations.  Initial work-up significant for DVT and left-sided pleural effusion.  She also had diffuse anasarca.  This was all felt to be in the context of poor nutrition and low albumin.  She was treated with diuretics.  On 11/7 in the AM hours she suffered ventricular fibrillation cardiac arrest and underwent 3 minutes of CPR with return of spontaneous circulation prior to defibrillation. Then a few hours later she went into pulsatile VT, which was treated with cardioversion x 1. She was transferred to the ICU after cardiology consultation. K and mag were found to be low and supplementation was arranged. PCCM was consulted.   Pertinent  Medical History   has a past medical history of Fatty liver, GERD (gastroesophageal reflux disease), Hypertension, Osteomyelitis ([place]), Peripheral vascular disease ([place]), and Ulcer (08/14/20).   Significant Hospital Events: Including procedures, antibiotic start and stop dates in addition to other pertinent events   11/3 admitted with CP and palpitations.  Thoracentesis left for 500cc. 11/7 Cardiac arrest  Interim History / Subjective:  No acute events overnight.   Objective   Blood pressure 110/77, pulse (!) 107, temperature 97.9 F (36.6 C), temperature source Oral, resp. rate 19, height $RemoveBe'5\' 1"'DHFNiPhHJ$  (1.549 m),  weight 61.7 kg, SpO2 100 %.        Intake/Output Summary (Last 24 hours) at 05/08/2021 1118 Last data filed at 05/08/2021 0830 Gross per 24 hour  Intake 958.08 ml  Output 0 ml  Net 958.08 ml    Filed Weights   05/05/21 2328 05/06/21 0559 05/07/21 0549  Weight: 64.3 kg 61.1 kg 61.7 kg    Examination: General: frail elderly female in NAD HENT: [place]/AT, PERRL, no JVD Lungs: Clear bilateral breath sounds Cardiovascular: rrr, no mrg Abdomen: Soft, non-tender, non-distended Extremities: No acute deformity. Globally weak. Anasarca in upper extremities and waist. Lower extremity trace edema.  Neuro: alert oriented non-focal   Resolved Hospital Problem list     Assessment & Plan:   VF/VT arrest: secondary to electrolyte derangements in the setting of aggressive diuresis. Bradycardic PVC seemed to cause rhythm change. Patient regained consciousness post arrest and did not require intubation.  - ICU hemodynamic monitoring - Currently sinus - electrolyte replacement ongoing. Will add kphos and magnesium additionally.  - check ionized calcium - Isoproterenol off  Chronic HFrEF: LVEF 40-45%. IVC down on echo despite anasarca pointing away from cardiac cause.  - spironolactone started by cardiology who are following.   Anasarca: likely due to hypoalbuminemia. Fatty liver disease listed in history, RUQ [place] with no signs of cirrhosis. ESR and CRP are negative. She describes poor PO intake due to susequent vomiting or diarrhea shortly after eating. Malabsorption issue? Possibly related to protonix, which was a new medication for her. Protein calorie malnutrition - Consult dietician - Consider appetite stimulant.  - send iron, anemia studies. Check fat soluble vitamin levels. Fecal  fat assessment.  - Consult GI  Bilateral pleural effusions: protein low and LDH marginally elevated, but no serum comparisons. Likely due to hypoalbuminemia as above.  - Supportive care.   DVT - continue  eliquis  Acute R renal infarct source unclear - anticoagulation as above - ? Atrial fib history, per cards  Best Practice (right click and "Reselect all SmartList Selections" daily)   Diet/type: Regular consistency (see orders) DVT prophylaxis: DOAC GI prophylaxis: PPI Lines: N/A Foley:  N/A Code Status:  full code Last date of multidisciplinary goals of care discussion $RemoveBeforeD'[ ]'[id number]$   Labs   CBC: Recent Labs  Lab 05/02/21 1945 05/04/21 0238 05/05/21 0403 05/07/21 0855 05/08/21 0520  WBC 9.8 8.6 6.9 8.7 8.0  NEUTROABS 7.2  --   --   --   --   HGB 11.8* 12.2 10.7* 10.2* 9.2*  HCT 32.2* 32.7* 29.9* 27.6* 25.2*  MCV 93.1 91.3 92.9 92.9 92.3  PLT 317 304 310 300 256     Basic Metabolic Panel: Recent Labs  Lab 05/04/21 0238 05/05/21 0403 05/07/21 0855 05/07/21 1825 05/07/21 2343 05/08/21 0520  NA 135 135 138 133* 134* 131*  K 2.7* 4.4 2.6* <2.0* 2.5* 3.1*  CL 100 103 108 97* 99 97*  CO2 $Re'27 23 24 27 29 28  'mUd$ GLUCOSE 98 79 74 206* 153* 159*  BUN 8 8 <5* <5* <5* <5*  CREATININE 0.63 0.55 0.38* 0.50 0.48 0.47  CALCIUM 7.6* 7.3* 5.5* 7.4* 7.4* 7.4*  MG 1.6*  --  0.8* 2.6* 2.2 2.0  PHOS  --   --  2.7  --   --  3.1    GFR: Estimated Creatinine Clearance: 57.5 mL/min (by C-G formula based on SCr of 0.47 mg/dL). Recent Labs  Lab 05/04/21 0238 05/05/21 0403 05/07/21 0855 05/08/21 0520  WBC 8.6 6.9 8.7 8.0     Liver Function Tests: Recent Labs  Lab 05/02/21 1945 05/07/21 0855 05/07/21 2343  AST 25 50* 29  ALT $Re'17 11 16  'AUI$ ALKPHOS 128* 89 96  BILITOT 0.6 0.6 0.8  PROT 4.4* 3.4* 4.0*  ALBUMIN 1.5* <1.5* 1.7*    Recent Labs  Lab 05/02/21 1945  LIPASE 21    No results for input(s): AMMONIA in the last 168 hours.  ABG    Component Value Date/Time   TCO2 27 04/20/2021 0111      Coagulation Profile: No results for input(s): INR, PROTIME in the last 168 hours.  Cardiac Enzymes: No results for input(s): CKTOTAL, CKMB, CKMBINDEX, TROPONINI in the last 168  hours.  HbA1C: Hgb A1c MFr Bld  Date/Time Value Ref Range Status  02/01/2021 03:09 PM 4.5 (L) 4.8 - 5.6 % Final    Comment:             Prediabetes: 5.7 - 6.4          Diabetes: >6.4          Glycemic control for adults with diabetes: <7.0     CBG: Recent Labs  Lab 05/07/21 0820  GLUCAP 88      Past Medical History:  She,  has a past medical history of Fatty liver, GERD (gastroesophageal reflux disease), Hypertension, Osteomyelitis ([place]), Peripheral vascular disease ([place]), and Ulcer (08/14/20).   Surgical History:   Past Surgical History:  Procedure Laterality Date   AMPUTATION Right 08/08/2017   Procedure: AMPUTATION RIGHT 3RD TOE;  Surgeon: Newt Minion, MD;  Location: [place];  Service: Orthopedics;  Laterality: Right;   BIOPSY  08/14/2020   Procedure: BIOPSY;  Surgeon: Clarene Essex, MD;  Location: [facility];  Service: Endoscopy;;   Bunionectomy Right 2006     Right   Bunionectony Left 2006      August   COLONOSCOPY N/A 01/21/2014   Procedure: COLONOSCOPY;  Surgeon: Beryle Beams, MD;  Location: [facility];  Service: Endoscopy;  Laterality: N/A;   ESOPHAGOGASTRODUODENOSCOPY (EGD) WITH PROPOFOL N/A 08/14/2020   Procedure: ESOPHAGOGASTRODUODENOSCOPY (EGD) WITH PROPOFOL;  Surgeon: Clarene Essex, MD;  Location: [facility];  Service: Endoscopy;  Laterality: N/A;   IR THORACENTESIS ASP PLEURAL SPACE W/IMG GUIDE  05/03/2021   TUBAL LIGATION     WISDOM TOOTH EXTRACTION       Social History:   reports that she has never smoked. She has never used smokeless tobacco. She reports current alcohol use of about 5.0 standard drinks per week. She reports that she does not use drugs.   Family History:  Her family history includes Arthritis in her father and mother; Breast cancer in her mother; Cancer in her father and mother; Colon cancer in her mother; Heart Problems in her mother; Heart disease in her father and mother; Hypertension in her father and mother; Multiple myeloma in  her father and mother.   Allergies No Known Allergies   Home Medications  Prior to Admission medications   Medication Sig Start Date End Date Taking? Authorizing Provider  acetaminophen (TYLENOL) 325 MG tablet Take 650 mg by mouth every 6 (six) hours as needed for moderate pain.   Yes [provider]  bismuth subsalicylate (PEPTO BISMOL) 262 MG/15ML suspension Take 30 mLs by mouth every 6 (six) hours as needed for indigestion.   Yes [provider]  calcium carbonate (TUMS EX) 750 MG chewable tablet Chew 1 tablet by mouth daily as needed for heartburn.   Yes [provider]  Cholecalciferol (VITAMIN D3) 5000 units CAPS Take 5,000 Units by mouth daily.   Yes [provider]  HYDROcodone-acetaminophen (NORCO) 5-325 MG tablet Take 1 tablet by mouth every 12 (twelve) hours as needed for moderate pain. 05/02/21  Yes Medina-Vargas, Monina C, NP  hydrOXYzine (VISTARIL) 25 MG capsule Take 1 capsule (25 mg total) by mouth 2 (two) times daily as needed. Patient taking differently: Take 25 mg by mouth 2 (two) times daily as needed for anxiety. 04/26/21  Yes Medina-Vargas, Monina C, NP  Magnesium Hydroxide (MILK OF MAGNESIA PO) Take 30 mLs by mouth daily as needed (constipation).   Yes [provider]  Multiple Vitamin (MULTIVITAMIN WITH MINERALS) TABS tablet Take 1 tablet by mouth daily.   Yes [provider]  ondansetron (ZOFRAN ODT) 4 MG disintegrating tablet Take 1 tablet (4 mg total) by mouth every 8 (eight) hours as needed for nausea or vomiting. 04/04/21  Yes Mariel Aloe, MD  pantoprazole (PROTONIX) 40 MG tablet Take 1 tablet (40 mg total) by mouth daily. 04/04/21  Yes Mariel Aloe, MD  potassium chloride SA (KLOR-CON) 20 MEQ tablet Take 1 tablet (20 mEq total) by mouth 2 (two) times daily. 02/14/21  Yes Magrinat, Virgie Dad, MD  sertraline (ZOLOFT) 25 MG tablet Take 1 tablet (25 mg total) by mouth daily. 02/01/21  Yes Ghumman, Ramandeep, NP   Tetrahydrozoline HCl (VISINE OP) Place 2 drops into both eyes daily as needed (for dry/irritated eyes).    Yes [provider]  feeding supplement (ENSURE ENLIVE / ENSURE PLUS) LIQD Take 237 mLs by mouth 3 (three) times daily between meals. Patient not taking:  Reported on 05/03/2021 04/04/21   Mariel Aloe, MD  nystatin (MYCOSTATIN) 100000 UNIT/ML suspension SWISH 5 ML BY MOUTH AND SPIT OUT FOUR TIMES DAILY X 2 WEEKS FOR ORAL YEAST Patient not taking: Reported on 05/03/2021    [provider]          Georgann Housekeeper, AGACNP-BC [place]  See Amion for personal pager PCCM on call pager [phone] until 7pm. Please call Elink 7p-7a. [phone]  05/08/2021 11:18 AM

## 2021-05-08 NOTE — Consult Note (Signed)
Referring Provider: CCM Primary Care Physician:  Glendale Chard, MD Primary Gastroenterologist:  Dr. Watt Climes  Reason for Consultation: diarrhea, N/V, anasarca; severe protein-calorie malnutrition  HPI: Debbie Barry is a 70 y.o. female presenting to the emergency department after an episode of chest discomfort and palpitations on 05/02/2021.  Patient was found to have a DVT, but no PE on CTA chest, acute right renal infarct, large bilateral pleural effusions, anasarca; severe protein-calorie malnutrition, hyponatremia.  Patient was diuresed, had electrolyte abnormalities, went into V. Fib/VT arrest and was cardioverted x1, and is currently in the cardiac unit followed by PCCM.  GI consulted in regard to anasarca, history of fatty liver disease, possible malabsorption issue. Patient has poor p.o. intake due to subsequent vomiting or diarrhea shortly after eating February. On Protonix.    Patient was seen February 2022 in the hospital for hematemesis and melena, had endoscopy at that time with Dr. Watt Climes that showed gastritis, duodenitis, negative H. pylori and dysplasia. Patient husband gives story, states since that time in February continue to have nausea, vomiting and diarrhea. States with any food, water or even the smell of food patient would have nausea, vomiting.   Denies hematemesis,, odynophagia. Patient has early satiety, has had for years, worse last several months. Denies AB bloating.  Patient states he is having diarrhea 2-3 times a day, associated after bowel movements.  Could be oily at times, watery, denies hematochezia or melena. Has been having reflux for years, improved slightly with Protonix once daily started for gastritis/duodenitis.  Only taking once daily. Patient had 50 pound weight loss since February. Patient denies urinary issues prior to 1 month ago when patient started to have urinary retention, started to have in and out caths.  Her hospital visit in February,  patient has had multiple hospital admissions. Iron overload after melena hematemesis visit, following with Dr. Jana Hakim.  Negative protein electrophoresis, ANA, sed rate. Normal Thyroid.   In October, patient was admitted following 3 weeks of diarrhea, nausea, vomiting.  GI pathogen panel and C. difficile were negative at that time.  Patient maculopapular rash with pruritus, improved with steroid continues to have small amount on left leg.   Patient had CT of abdomen and pelvis on 05/03/2021 -no liver abnormality seen, no evidence of cirrhosis, mild ascites, normal stomach, large bowel, small bowel.  CT Abd/Pelvis w/ contrast 05/03/2021: Hepatobiliary: No focal liver abnormality is seen. No gallstones, gallbladder wall thickening, or biliary dilatation. Pancreas: Unremarkable Spleen: Unremarkable Adrenals/Urinary Tract: The adrenal glands are unremarkable. There is a perfusion defect involving the upper pole of the right kidney in keeping with an acute renal infarct. The kidneys are otherwise unremarkable. Foley catheter balloon seen within a decompressed bladder lumen. Stomach/Bowel: Mild scattered colonic diverticulosis. Stomach, small bowel, and large bowel are otherwise unremarkable. Appendix normal. No free intraperitoneal gas. Mild ascites. Vascular/Lymphatic: Mild aortoiliac atherosclerotic calcification. No aortic aneurysm. A a filling defect is seen within the left internal iliac vein, axial image # 46/6, compatible with acute DVT. The abdominal vasculature is otherwise unremarkable. No pathologic adenopathy within the abdomen and pelvis. Reproductive: Uterus and bilateral adnexa are unremarkable. Other: There is marked diffuse body wall subcutaneous edema. No abdominal wall hernia.  Previous upper endoscopy with Dr. Watt Climes 08/14/2020 for history of hematemesis and melena in setting of NSAID use: - Tiny hiatal hernia. - Non-bleeding gastric ulcer with no stigmata of  bleeding. - Chronic gastritis. Biopsied. Negative for H. Pylori. - Caustic duodenitis. - Normal second portion of the duodenum  and third portion of the duodenum. - The examination was otherwise normal.  Previous colonoscopy 01/21/2014 with Dr. Benson Norway: IMPRESSION: 1) Polyp. 2) Diverticula.  Past Medical History:  Diagnosis Date   Fatty liver    GERD (gastroesophageal reflux disease)    Hypertension    Osteomyelitis ([place])    right third toe   Peripheral vascular disease ([place])    Ulcer 08/14/20    Past Surgical History:  Procedure Laterality Date   AMPUTATION Right 08/08/2017   Procedure: AMPUTATION RIGHT 3RD TOE;  Surgeon: Newt Minion, MD;  Location: [facility];  Service: Orthopedics;  Laterality: Right;   BIOPSY  08/14/2020   Procedure: BIOPSY;  Surgeon: Clarene Essex, MD;  Location: [facility];  Service: Endoscopy;;   Bunionectomy Right 2006     Right   Bunionectony Left 2006      August   COLONOSCOPY N/A 01/21/2014   Procedure: COLONOSCOPY;  Surgeon: Beryle Beams, MD;  Location: [facility];  Service: Endoscopy;  Laterality: N/A;   ESOPHAGOGASTRODUODENOSCOPY (EGD) WITH PROPOFOL N/A 08/14/2020   Procedure: ESOPHAGOGASTRODUODENOSCOPY (EGD) WITH PROPOFOL;  Surgeon: Clarene Essex, MD;  Location: [facility];  Service: Endoscopy;  Laterality: N/A;   IR THORACENTESIS ASP PLEURAL SPACE W/IMG GUIDE  05/03/2021   TUBAL LIGATION     WISDOM TOOTH EXTRACTION      Prior to Admission medications   Medication Sig Start Date End Date Taking? Authorizing Provider  acetaminophen (TYLENOL) 325 MG tablet Take 650 mg by mouth every 6 (six) hours as needed for moderate pain.   Yes [provider]  bismuth subsalicylate (PEPTO BISMOL) 262 MG/15ML suspension Take 30 mLs by mouth every 6 (six) hours as needed for indigestion.   Yes [provider]  calcium carbonate (TUMS EX) 750 MG chewable tablet Chew 1 tablet by mouth daily as needed for heartburn.   Yes [provider]   Cholecalciferol (VITAMIN D3) 5000 units CAPS Take 5,000 Units by mouth daily.   Yes [provider]  HYDROcodone-acetaminophen (NORCO) 5-325 MG tablet Take 1 tablet by mouth every 12 (twelve) hours as needed for moderate pain. 05/02/21  Yes Medina-Vargas, Monina C, NP  hydrOXYzine (VISTARIL) 25 MG capsule Take 1 capsule (25 mg total) by mouth 2 (two) times daily as needed. Patient taking differently: Take 25 mg by mouth 2 (two) times daily as needed for anxiety. 04/26/21  Yes Medina-Vargas, Monina C, NP  Magnesium Hydroxide (MILK OF MAGNESIA PO) Take 30 mLs by mouth daily as needed (constipation).   Yes [provider]  Multiple Vitamin (MULTIVITAMIN WITH MINERALS) TABS tablet Take 1 tablet by mouth daily.   Yes [provider]  ondansetron (ZOFRAN ODT) 4 MG disintegrating tablet Take 1 tablet (4 mg total) by mouth every 8 (eight) hours as needed for nausea or vomiting. 04/04/21  Yes Mariel Aloe, MD  pantoprazole (PROTONIX) 40 MG tablet Take 1 tablet (40 mg total) by mouth daily. 04/04/21  Yes Mariel Aloe, MD  potassium chloride SA (KLOR-CON) 20 MEQ tablet Take 1 tablet (20 mEq total) by mouth 2 (two) times daily. 02/14/21  Yes Magrinat, Virgie Dad, MD  sertraline (ZOLOFT) 25 MG tablet Take 1 tablet (25 mg total) by mouth daily. 02/01/21  Yes Ghumman, Ramandeep, NP  Tetrahydrozoline HCl (VISINE OP) Place 2 drops into both eyes daily as needed (for dry/irritated eyes).    Yes [provider]  feeding supplement (ENSURE ENLIVE / ENSURE PLUS) LIQD Take 237 mLs by mouth 3 (three) times daily  between meals. Patient not taking: Reported on 05/03/2021 04/04/21   Mariel Aloe, MD  nystatin (MYCOSTATIN) 100000 UNIT/ML suspension SWISH 5 ML BY MOUTH AND SPIT OUT FOUR TIMES DAILY X 2 WEEKS FOR ORAL YEAST Patient not taking: Reported on 05/03/2021    [provider]    Scheduled Meds:  apixaban  10 mg Oral BID   Followed by   Derrill Memo ON 05/11/2021] apixaban  5 mg  Oral BID   Chlorhexidine Gluconate Cloth  6 each Topical Daily   feeding supplement  1 Container Oral TID BM   megestrol  400 mg Oral BID   multivitamin with minerals  1 tablet Oral Daily   pantoprazole  40 mg Oral Daily   sodium chloride flush  10-40 mL Intracatheter Q12H   sodium chloride flush  3 mL Intravenous Q12H   spironolactone  12.5 mg Oral Daily   Continuous Infusions:  sodium chloride     magnesium sulfate bolus IVPB 2 g (05/08/21 1224)   potassium PHOSPHATE IVPB (in mmol)     PRN Meds:.sodium chloride, acetaminophen, HYDROcodone-acetaminophen, lidocaine (PF), loperamide, melatonin, sodium chloride flush, sodium chloride flush  Allergies as of 05/02/2021   (No Known Allergies)    Family History  Problem Relation Age of Onset   Breast cancer Mother    Colon cancer Mother    Heart Problems Mother    Hypertension Mother    Multiple myeloma Mother    Arthritis Mother    Cancer Mother    Heart disease Mother    Hypertension Father    Heart disease Father    Multiple myeloma Father    Arthritis Father    Cancer Father     Social History   Socioeconomic History   Marital status: Married    Spouse name: Not on file   Number of children: Not on file   Years of education: Not on file   Highest education level: Not on file  Occupational History   Not on file  Tobacco Use   Smoking status: Never   Smokeless tobacco: Never  Vaping Use   Vaping Use: Never used  Substance and Sexual Activity   Alcohol use: Yes    Alcohol/week: 5.0 standard drinks    Types: 5 Glasses of wine per week    Comment: social glass of wine   Drug use: No   Sexual activity: Yes    Birth control/protection: Post-menopausal  Other Topics Concern   Not on file  Social History Narrative   Not on file   Social Determinants of Health   Financial Resource Strain: Not on file  Food Insecurity: Not on file  Transportation Needs: Not on file  Physical Activity: Not on file  Stress:  Not on file  Social Connections: Not on file  Intimate Partner Violence: Not on file    Review of Systems:  Review of Systems  Constitutional:  Positive for malaise/fatigue and weight loss. Negative for chills, diaphoresis and fever.  HENT:  Negative for sore throat.   Respiratory:  Positive for shortness of breath and wheezing. Negative for cough and hemoptysis.   Cardiovascular:  Positive for chest pain, palpitations and leg swelling.  Gastrointestinal:  Positive for abdominal pain, diarrhea, heartburn, nausea and vomiting. Negative for blood in stool, constipation and melena.  Musculoskeletal:  Negative for falls.  Skin:  Positive for rash.  Neurological:  Positive for weakness. Negative for loss of consciousness.  Psychiatric/Behavioral:  Negative for memory loss.    Physical  Exam: Vital signs: Vitals:   05/08/21 1100 05/08/21 1132  BP: 110/77   Pulse: (!) 107   Resp: 19   Temp:  99 F (37.2 C)  SpO2: 100%    Last BM Date: 05/04/21 General:   Alert, in NAD, cushingoid appearance to face from anasarca, exophthalmos. Heart:  tachycardia, Regular rhythm; no murmurs Pulm: Clear anteriorly; no wheezing, tachypnea Abdomen:  Soft,  protuberent , mild distention, AB, skin exam normal, Normal bowel sounds. mild tenderness in the epigastrium and in the lower abdomen. Without guarding and Without rebound, without hepatomegaly. Extremities:  Diffuse edema bilateral arms, trunk, and 1+ edema bilateral legs.  Neurologic:  Alert and  oriented x4;  grossly normal neurologically. Psych:  Alert and cooperative. Normal mood and affect.  GI:  Lab Results: Recent Labs    05/07/21 0855 05/08/21 0520  WBC 8.7 8.0  HGB 10.2* 9.2*  HCT 27.6* 25.2*  PLT 300 256   BMET Recent Labs    05/07/21 1825 05/07/21 2343 05/08/21 0520  NA 133* 134* 131*  K <2.0* 2.5* 3.1*  CL 97* 99 97*  CO2 $Re'27 29 28  'nBe$ GLUCOSE 206* 153* 159*  BUN <5* <5* <5*  CREATININE 0.50 0.48 0.47  CALCIUM 7.4* 7.4*  7.4*   LFT Recent Labs    05/07/21 2343  PROT 4.0*  ALBUMIN 1.7*  AST 29  ALT 16  ALKPHOS 96  BILITOT 0.8   PT/INR No results for input(s): LABPROT, INR in the last 72 hours.  Studies/Results: DG Chest Left Decubitus  Result Date: 05/07/2021 CLINICAL DATA:  Possible pneumothorax. EXAM: CHEST - LEFT DECUBITUS COMPARISON:  Chest radiograph done earlier the same day. CT chest 05/03/2021. FINDINGS: No right-sided pneumothorax. Layering bilateral pleural effusions. Nodular density is seen in the right midlung zone. Correlation with CT 05/03/2021 is challenging due to a large pleural effusion and collapse/consolidation in the right lung on that study. IMPRESSION: 1. No right pneumothorax. 2. Nodular density in the right midlung zone. Recommend attention on follow-up. 3. Bilateral pleural effusions. Electronically Signed   By: Lorin Picket M.D.   On: 05/07/2021 13:15   DG CHEST PORT 1 VIEW  Result Date: 05/07/2021 CLINICAL DATA:  Cardiac arrest. EXAM: PORTABLE CHEST 1 VIEW COMPARISON:  05/03/2021 and CT chest 05/03/2021. FINDINGS: Trachea is midline. Heart is enlarged, stable. Thoracic aorta is calcified. Defibrillator pad overlies the mid chest. There is a vertically oriented linear density along the apical and lateral aspect of the right hemithorax but there may be parenchymal markings extending beyond margin. Interstitial prominence and indistinctness with bilateral pleural effusions. Left lower lobe collapse/consolidation. Degenerative changes in the shoulders. IMPRESSION: 1. Possible artifact versus small pneumothorax in the right hemithorax. Recommend left lateral decubitus view in further evaluation, as clinically indicated. These results will be called to the ordering clinician or representative by the Radiologist Assistant, and communication documented in the PACS or [employer]. 2. Congestive heart failure. 3. Left lower lobe collapse/consolidation. Difficult to exclude aspiration.  Electronically Signed   By: Lorin Picket M.D.   On: 05/07/2021 08:55   ECHOCARDIOGRAM COMPLETE  Result Date: 05/07/2021    ECHOCARDIOGRAM REPORT   Patient Name:   RINDI BEECHY Date of Exam: 05/07/2021 Medical Rec #:  [id number]          Height:       61.0 in Accession #:    [id number]         Weight:       136.0 lb Date of  Birth:  [birth year]         BSA:          1.603 m Patient Age:    32 years           BP:           126/66 mmHg Patient Gender: F                  HR:           93 bpm. Exam Location:  Inpatient Procedure: 2D Echo, Cardiac Doppler and Color Doppler Indications:    Cardiac arrest  History:        Patient has prior history of Echocardiogram examinations, most                 recent 04/21/2021. CHF, PAD, Arrythmias:Cardiac Arrest and                 Ventricular Fibrillation, Signs/Symptoms:Chest Pain and Large                 pleural effusion; Risk Factors:Hypertension.  Sonographer:    Dustin Flock RDCS Referring Phys: [id number] [place]  1. Septal and apical hypokinesis . Left ventricular ejection fraction, by estimation, is 40 to 45%. The left ventricle has mildly decreased function. The left ventricle demonstrates regional wall motion abnormalities (see scoring diagram/findings for description). Left ventricular diastolic parameters were normal.  2. Right ventricular systolic function is normal. The right ventricular size is normal.  3. There is a persistent pleurs effusion and trivial pericardial effusion Also appears to be prominent epicardial fat seen over RV. Can consider f/u CT chest if clinically indicated to further assess. The pericardial effusion is lateral to the left ventricle.  4. The mitral valve is abnormal. Trivial mitral valve regurgitation. No evidence of mitral stenosis.  5. The aortic valve is tricuspid. There is mild calcification of the aortic valve. Aortic valve regurgitation is not visualized. Mild aortic valve sclerosis is present, with no  evidence of aortic valve stenosis.  6. The inferior vena cava is normal in size with greater than 50% respiratory variability, suggesting right atrial pressure of 3 mmHg. FINDINGS  Left Ventricle: Septal and apical hypokinesis. Left ventricular ejection fraction, by estimation, is 40 to 45%. The left ventricle has mildly decreased function. The left ventricle demonstrates regional wall motion abnormalities. The left ventricular internal cavity size was normal in size. There is no left ventricular hypertrophy. Left ventricular diastolic parameters were normal. Right Ventricle: The right ventricular size is normal. No increase in right ventricular wall thickness. Right ventricular systolic function is normal. Left Atrium: Left atrial size was normal in size. Right Atrium: Right atrial size was normal in size. Pericardium: There is a persistent pleurs effusion and trivial pericardial effusion Also appears to be prominent epicardial fat seen over RV. Can consider f/u CT chest if clinically indicated to further assess. Trivial pericardial effusion is present. The pericardial effusion is lateral to the left ventricle. Mitral Valve: The mitral valve is abnormal. Trivial mitral valve regurgitation. No evidence of mitral valve stenosis. Tricuspid Valve: The tricuspid valve is normal in structure. Tricuspid valve regurgitation is trivial. No evidence of tricuspid stenosis. Aortic Valve: The aortic valve is tricuspid. There is mild calcification of the aortic valve. Aortic valve regurgitation is not visualized. Mild aortic valve sclerosis is present, with no evidence of aortic valve stenosis. Pulmonic Valve: The pulmonic valve was normal in structure. Pulmonic valve regurgitation is not visualized. No evidence  of pulmonic stenosis. Aorta: The aortic root is normal in size and structure. Venous: The inferior vena cava is normal in size with greater than 50% respiratory variability, suggesting right atrial pressure of 3 mmHg.  IAS/Shunts: No atrial level shunt detected by color flow Doppler.  LEFT VENTRICLE PLAX 2D LVIDd:         4.50 cm     Diastology LVIDs:         2.90 cm     LV e' medial:    4.57 cm/s LV PW:         1.00 cm     LV E/e' medial:  10.0 LV IVS:        1.00 cm     LV e' lateral:   5.77 cm/s LVOT diam:     2.00 cm     LV E/e' lateral: 7.9 LV SV:         52 LV SV Index:   32 LVOT Area:     3.14 cm  LV Volumes (MOD) LV vol d, MOD A4C: 73.0 ml LV vol s, MOD A4C: 35.5 ml LV SV MOD A4C:     73.0 ml RIGHT VENTRICLE RV Basal diam:  2.70 cm RV S prime:     11.10 cm/s TAPSE (M-mode): 1.5 cm LEFT ATRIUM             Index        RIGHT ATRIUM           Index LA diam:        2.30 cm 1.43 cm/m   RA Area:     10.50 cm LA Vol (A2C):   23.9 ml 14.91 ml/m  RA Volume:   20.30 ml  12.66 ml/m LA Vol (A4C):   39.9 ml 24.89 ml/m LA Biplane Vol: 31.6 ml 19.71 ml/m  AORTIC VALVE LVOT Vmax:   103.00 cm/s LVOT Vmean:  70.200 cm/s LVOT VTI:    0.164 m  AORTA Ao Root diam: 2.70 cm MITRAL VALVE MV Area (PHT): 5.62 cm    SHUNTS MV Decel Time: 135 msec    Systemic VTI:  0.16 m MV E velocity: 45.80 cm/s  Systemic Diam: 2.00 cm MV A velocity: 71.10 cm/s MV E/A ratio:  0.64 Jenkins Rouge MD Electronically signed by Jenkins Rouge MD Signature Date/Time: 05/07/2021/2:32:42 PM    Final    [place] EKG SITE RITE  Result Date: 05/07/2021 If Site Rite image not attached, placement could not be confirmed due to current cardiac rhythm.  US Abdomen Limited RUQ (LIVER/GB)  Result Date: 05/07/2021 CLINICAL DATA:  Ascites on CT scan dated 05/03/2021 EXAM: ULTRASOUND ABDOMEN LIMITED RIGHT UPPER QUADRANT COMPARISON:  None. FINDINGS: Gallbladder: No gallstones. Mild focal thickening of the gallbladder wall measuring up to 4 mm. The technologist reported positive sonographic Murphy's sign. Common bile duct: Diameter: 2 mm Liver: No focal lesion identified. Within normal limits in parenchymal echogenicity. Portal vein is patent on color Doppler imaging with normal  direction of blood flow towards the liver. Other: Mild ascites IMPRESSION: 1.  Mild ascites. 2. Mild focal gallbladder wall thickening and positive sonographic Murphy sign, nonspecific findings. This may be sequela of chronic ascites and/or volume overload. Clinical correlation is suggested. Electronically Signed   By: Keane Police D.O.   On: 05/07/2021 15:54    Impression Nausea, vomiting and diarrhea with weight loss  CT AB and pelvis 11/03 with normal pancreas, small intestines, large intestines, stomach.  Colonoscopy normal other than diverticula 2015  Gastritis/duodenitis  EGD 08/2020 negative H pylori/dysplasia.   Started on protonix once daily 40 mg Anasarca secondary to severe protein-calorie malnutrition DVT on eliquis Renal infarct VF/VT arrest secondary to electrolyte derangements. Chronic heart failure   Plan - Celiac panel (TTG+IgA, Immunoglobulin A) -Fecal pancreatic elastase - Giardia stool tests - alpha 1 antitrypsin and cermoplasmin - serum transferrin and fibrinogen - Cdiff/GI pathogen panel -total serum IGA, IGG, IGM - Dietary consult, low fat and high protein with high medium chain trigs  -Pending fat-soluble vitamins, fecal fat. - will stop PPI and switch to carafate QID Colestipol 1 gram BID  Ideally she would benefit from colonoscopy and EGD however with recent VF/VT arrest and CPR, will not suggest endoscopic evaluation for another 4-8 weeks.    LOS: 5 days   Vladimir Crofts  PA-C 05/08/2021, 12:58 PM  Contact #  [phone]

## 2021-05-08 NOTE — Progress Notes (Signed)
Physical Therapy Treatment Patient Details Name: Debbie Barry MRN: [id number] DOB: March 04, 1951 Today's Date: 05/08/2021   History of Present Illness Pt adm 11/2 with chest pain and found to have lt common iliac DVT. Pt also found to have acute rt renal infarct, acute on chronic diastolic heart failure and large bilateral pleural effusions. On 11/7 pt suffered a vfib/vtach arrest and received CPR and shock.  PMH - spinal stenosis, chronic back pain, obesity, chf, htn, pvd, pt with recent hospital admission with BLE weakness and found to have chronic L3-4 compression fx and severe L4-5 canal stenosis.    PT Comments    Pt remains weak and fatigues quickly with activity. Pt continues to want to return home with husband and does not want to return to SNF. Pt will need assist with all mobility and care at home. Would also need mechanical lift. Family will need to be on board with this plan.   Recommendations for follow up therapy are one component of a multi-disciplinary discharge planning process, led by the attending physician.  Recommendations may be updated based on patient status, additional functional criteria and insurance authorization.  Follow Up Recommendations  Home health PT (pt does not want to return to SNF. Will need lift and significant family support)     Assistance Recommended at Discharge Frequent or constant Supervision/Assistance  Equipment Recommendations  Other (comment) (hoyer lift, per pt wheelchair already delivered to home)    Recommendations for Other Services       Precautions / Restrictions Precautions Precautions: Fall;Other (comment) Precaution Comments: monitor HR Required Braces or Orthoses: Spinal Brace Spinal Brace: Lumbar corset Other Brace: Pt had a brace from last admission but not present in the room. Stated she used some when up.     Mobility  Bed Mobility Overal bed mobility: Needs Assistance Bed Mobility: Rolling;Sidelying to Sit;Sit to  Sidelying Rolling: Min assist;Mod assist (min toward rt and mod toward lt) Sidelying to sit: Mod assist;+2 for safety/equipment;HOB elevated     Sit to sidelying: Mod assist;+2 for safety/equipment General bed mobility comments: Assist to bring elevate trunk into sitting and bring hips to EOB. Assist to bring legs back up into bed returning to sidelying    Transfers                   General transfer comment: Did not atempt. Recommend maxisky/maximove whe stable from cardiac standpoint    Ambulation/Gait                   Stairs             Wheelchair Mobility    Modified Rankin (Stroke Patients Only)       Balance Overall balance assessment: Needs assistance Sitting-balance support: No upper extremity supported;Feet unsupported Sitting balance-Leahy Scale: Fair                                      Cognition Arousal/Alertness: Awake/alert Behavior During Therapy: WFL for tasks assessed/performed Overall Cognitive Status: Within Functional Limits for tasks assessed                                          Exercises General Exercises - Lower Extremity Ankle Circles/Pumps: AROM;Both;10 reps;Seated Long Arc Quad: AROM;Both;10 reps;Seated Hip Flexion/Marching: AAROM;Both;10 reps;Seated  General Comments        Pertinent Vitals/Pain Pain Assessment: Faces Faces Pain Scale: Hurts little more Pain Location: generalized - skin is sensitive Pain Descriptors / Indicators: Grimacing Pain Intervention(s): Limited activity within patient's tolerance;Repositioned    Home Living                          Prior Function            PT Goals (current goals can now be found in the care plan section) Acute Rehab PT Goals Patient Stated Goal: go home Progress towards PT goals: Progressing toward goals    Frequency    Min 2X/week      PT Plan Current plan remains appropriate;Frequency needs to be  updated (If family able and committed to providing significant assist and use mechanical lift)    Co-evaluation              AM-PAC PT "6 Clicks" Mobility   Outcome Measure  Help needed turning from your back to your side while in a flat bed without using bedrails?: A Lot Help needed moving from lying on your back to sitting on the side of a flat bed without using bedrails?: A Lot Help needed moving to and from a bed to a chair (including a wheelchair)?: Total Help needed standing up from a chair using your arms (e.g., wheelchair or bedside chair)?: Total Help needed to walk in hospital room?: Total Help needed climbing 3-5 steps with a railing? : Total 6 Click Score: 8    End of Session   Activity Tolerance: Patient limited by fatigue Patient left: in bed;with call bell/phone within reach Nurse Communication: Mobility status;Need for lift equipment       Time: 1012-1031 PT Time Calculation (min) (ACUTE ONLY): 19 min  Charges:  $Therapeutic Activity: 8-22 mins                     [place] Pager [phone] Office [place] 05/08/2021, 12:11 PM

## 2021-05-08 NOTE — Progress Notes (Addendum)
Nutrition Follow-up  DOCUMENTATION CODES:   Severe malnutrition in context of chronic illness  INTERVENTION:   Encourage high protein diet however pt has yet to tolerate POs without N/V/D  Order additional vitamin labs:  Vitamin A, B1, B6, C, Copper, and Zinc (Pt has been on a MVI with minerals daily since 11/4)   Encourage PO intake as able  Boost Breeze po TID, each supplement provides 250 kcal and 9 grams of protein  Start oral thiamine 100 mg daily   If po intake does not improve recommend a post pyloric cortrak tube for nutrition support.    NUTRITION DIAGNOSIS:   Severe Malnutrition related to chronic illness (chronic N/V/D) as evidenced by severe muscle depletion, energy intake < or equal to 50% for > or equal to 1 month. Ongoing.   GOAL:   Patient will meet greater than or equal to 90% of their needs Progressing.   MONITOR:   PO intake, Supplement acceptance, Labs  REASON FOR ASSESSMENT:   Consult Assessment of nutrition requirement/status  ASSESSMENT:   70 yo female admitted with DVT R common femoral vein and R femoral vein, acute on chronic CHF with anasarca. PMH includes HTN, PAD, fatty liver, chronic back pain, spinal stenosis, PVD, iron overload, gastritis, gastric ulcers.  Noted cardiac arrest 11/7 per MD likely due to abnormally low electrolytes and prolonged QTC.   Pt seen by RD in Oct 2022. At that time pt reported having trouble keeping food down since Feb 2022 but was worse the month of September. Vitamin C was slightly low at that time.   Spoke with both pt and husband. They celebrated their 27th anniversary yesterday. They report that before 08/2020 pt was very independent and they were on the go all the time. Since Feb pt has been less mobile and "on her back". They feel this really set her back like nothing before. She was seen by Dr Watt Climes 08/2020 and was dx with severe gastritis and started on protonix. She felt this did not really help. Her  next appointment was cancelled and she has not been seen by GI since.  She reports that she tries to eat some but is afraid to eat because she vomits.  Breakfast: few bites of grilled cheese, she usually vomits after this Lunch: nothing Dinner: sips of soup, she usually vomits after this  She at no lunch today. She has had some sips of her boost breeze and feels she has kept this down.  It is difficult to determine actual weight loss with weight hx in the chart. It is easy to see that she has lost weight in her face and pt/husband agree. BLE with edema as well as BUE.   GI has been consulted and they have ordered multiple labs/tests to include ceruloplasmin, ferritin, fibrogen, folate, Iga, IgG, Iron, TIBC, pancreatic elastase, reticulocytes, transferrin, vitamin A, and Vitamin B12. Evaluating for celiac disease, exocrine pancreatic insufficiency, stool for ova, parasite, giardia, cdiff, pathogen panel.   11/3 s/p R thoracentesis   11/7 s/p V.tach/V. fib arrest s/p CPR x 3 minutes   Medications reviewed and include: megace 400 mg BID, MVI with minerals, protonix, spironolactone Albumin x 1 KCl 10 mEq x 6  Kphos x 1   Labs reviewed: Na 131, K 3.1 Vitamin D: 87 CBG's: 153-159   NUTRITION - FOCUSED PHYSICAL EXAM:  Flowsheet Row Most Recent Value  Orbital Region No depletion  Upper Arm Region No depletion  Thoracic and Lumbar Region No depletion  Buccal  Region No depletion  Temple Region Severe depletion  Clavicle Bone Region Mild depletion  Clavicle and Acromion Bone Region Severe depletion  Scapular Bone Region Severe depletion  Dorsal Hand No depletion  Patellar Region No depletion  Anterior Thigh Region No depletion  Posterior Calf Region No depletion  Edema (RD Assessment) Moderate  Hair Reviewed  Eyes Reviewed  Mouth Reviewed  [does not wear dentures to eat]  Skin Reviewed  Nails Reviewed  [horizontal grooves on both thumb nails]       Diet Order:   Diet Order              Diet Heart Room service appropriate? Yes; Fluid consistency: Thin  Diet effective now                   EDUCATION NEEDS:   No education needs have been identified at this time  Skin:  Skin Assessment:  (non-pressure: sacrum, L buttocks) Skin Integrity Issues:: Other (Comment) Other: pretibial venous stasis ulcer  Last BM:  11/8 x 2 small  Height:   Ht Readings from Last 1 Encounters:  05/02/21 5\' 1"  (1.549 m)    Weight:   Wt Readings from Last 1 Encounters:  05/09/21 61.2 kg    BMI:  Body mass index is 25.49 kg/m.  Estimated Nutritional Needs:   Kcal:  1800-2000  Protein:  105-120  Fluid:  >1.8 L/day  Lockie Pares., RD, LDN, CNSC See AMiON for contact information

## 2021-05-09 ENCOUNTER — Inpatient Hospital Stay (HOSPITAL_COMMUNITY): Payer: Medicare HMO

## 2021-05-09 DIAGNOSIS — R601 Generalized edema: Secondary | ICD-10-CM | POA: Diagnosis not present

## 2021-05-09 DIAGNOSIS — J9601 Acute respiratory failure with hypoxia: Secondary | ICD-10-CM | POA: Diagnosis not present

## 2021-05-09 DIAGNOSIS — I469 Cardiac arrest, cause unspecified: Secondary | ICD-10-CM | POA: Diagnosis not present

## 2021-05-09 DIAGNOSIS — I824Y2 Acute embolism and thrombosis of unspecified deep veins of left proximal lower extremity: Secondary | ICD-10-CM | POA: Diagnosis not present

## 2021-05-09 DIAGNOSIS — I5021 Acute systolic (congestive) heart failure: Secondary | ICD-10-CM | POA: Diagnosis not present

## 2021-05-09 DIAGNOSIS — R9431 Abnormal electrocardiogram [ECG] [EKG]: Secondary | ICD-10-CM | POA: Diagnosis not present

## 2021-05-09 LAB — GLUCOSE, CAPILLARY
Glucose-Capillary: 111 mg/dL — ABNORMAL HIGH (ref 70–99)
Glucose-Capillary: 100 mg/dL — ABNORMAL HIGH (ref 70–99)

## 2021-05-09 LAB — GASTROINTESTINAL PANEL BY PCR, STOOL (REPLACES STOOL CULTURE)

## 2021-05-09 LAB — MAGNESIUM
Magnesium: 1.9 mg/dL (ref 1.7–2.4)
Magnesium: 2.1 mg/dL (ref 1.7–2.4)

## 2021-05-09 LAB — FERRITIN: Ferritin: 662 ng/mL — ABNORMAL HIGH (ref 11–307)

## 2021-05-09 LAB — BASIC METABOLIC PANEL
Anion gap: 4 — ABNORMAL LOW (ref 5–15)
Anion gap: 8 (ref 5–15)
Anion gap: 7 (ref 5–15)
BUN: <5 mg/dL — ABNORMAL LOW (ref 8–23)
BUN: 5 mg/dL — ABNORMAL LOW (ref 8–23)
BUN: <5 mg/dL — ABNORMAL LOW (ref 8–23)
CO2: 28 mmol/L (ref 22–32)
CO2: 26 mmol/L (ref 22–32)
CO2: 28 mmol/L (ref 22–32)
Calcium: 7.5 mg/dL — ABNORMAL LOW (ref 8.9–10.3)
Calcium: 7.8 mg/dL — ABNORMAL LOW (ref 8.9–10.3)
Calcium: 8.1 mg/dL — ABNORMAL LOW (ref 8.9–10.3)
Chloride: 101 mmol/L (ref 98–111)
Chloride: 99 mmol/L (ref 98–111)
Chloride: 100 mmol/L (ref 98–111)
Creatinine, Ser: 0.5 mg/dL (ref 0.44–1.00)
Creatinine, Ser: 0.4 mg/dL — ABNORMAL LOW (ref 0.44–1.00)
Creatinine, Ser: 0.45 mg/dL (ref 0.44–1.00)
GFR, Estimated: >60 mL/min (ref >60)
GFR, Estimated: >60 mL/min (ref >60)
GFR, Estimated: >60 mL/min (ref >60)
Glucose, Bld: 132 mg/dL — ABNORMAL HIGH (ref 70–99)
Glucose, Bld: 124 mg/dL — ABNORMAL HIGH (ref 70–99)
Glucose, Bld: 118 mg/dL — ABNORMAL HIGH (ref 70–99)
Potassium: 4.9 mmol/L (ref 3.5–5.1)
Potassium: 4.6 mmol/L (ref 3.5–5.1)
Potassium: 3.6 mmol/L (ref 3.5–5.1)
Sodium: 133 mmol/L — ABNORMAL LOW (ref 135–145)
Sodium: 133 mmol/L — ABNORMAL LOW (ref 135–145)
Sodium: 135 mmol/L (ref 135–145)

## 2021-05-09 LAB — CERULOPLASMIN: Ceruloplasmin: 16.9 mg/dL — ABNORMAL LOW (ref 19.0–39.0)

## 2021-05-09 LAB — ECHOCARDIOGRAM LIMITED
Height: 61 in
S' Lateral: 4 cm
Single Plane A4C EF: 34.8 %
Weight: 2158.74 oz

## 2021-05-09 LAB — CBC
HCT: 27.7 % — ABNORMAL LOW (ref 36.0–46.0)
Hemoglobin: 9.6 g/dL — ABNORMAL LOW (ref 12.0–15.0)
MCH: 33.1 pg (ref 26.0–34.0)
MCHC: 34.7 g/dL (ref 30.0–36.0)
MCV: 95.5 fL (ref 80.0–100.0)
Platelets: 273 10*3/uL (ref 150–400)
RBC: 2.9 MIL/uL — ABNORMAL LOW (ref 3.87–5.11)
RDW: 16 % — ABNORMAL HIGH (ref 11.5–15.5)
WBC: 8.4 10*3/uL (ref 4.0–10.5)
nRBC: 0 % (ref 0.0–0.2)

## 2021-05-09 LAB — PHOSPHORUS: Phosphorus: 3.9 mg/dL (ref 2.5–4.6)

## 2021-05-09 LAB — IRON AND TIBC: Iron: 40 ug/dL (ref 28–170)

## 2021-05-09 LAB — COPPER, SERUM: Copper: 80 ug/dL (ref 80–158)

## 2021-05-09 LAB — ALPHA-1-ANTITRYPSIN: A-1 Antitrypsin, Ser: 141 mg/dL (ref 101–187)

## 2021-05-09 LAB — IGG, IGA, IGM
IgA: 356 mg/dL — ABNORMAL HIGH (ref 87–352)
IgG (Immunoglobin G), Serum: 670 mg/dL (ref 586–1602)
IgM (Immunoglobulin M), Srm: 127 mg/dL (ref 26–217)

## 2021-05-09 LAB — CALCIUM, IONIZED: Calcium, Ionized, Serum: 4.8 mg/dL (ref 4.5–5.6)

## 2021-05-09 LAB — TSH: TSH: 4.44 u[IU]/mL (ref 0.350–4.500)

## 2021-05-09 LAB — VITAMIN B12: Vitamin B-12: 743 pg/mL (ref 180–914)

## 2021-05-09 MED ORDER — ONDANSETRON HCL 4 MG/2ML IJ SOLN
4.0000 mg | Freq: Once | INTRAMUSCULAR | Status: AC
  Administered 2021-05-09: 4 mg via INTRAVENOUS
  Filled 2021-05-09: qty 2

## 2021-05-09 MED ORDER — DIGOXIN 125 MCG PO TABS: 0.2500 mg | ORAL_TABLET | ORAL | Status: DC

## 2021-05-09 MED ORDER — POTASSIUM CHLORIDE 10 MEQ/50ML IV SOLN
10.0000 meq | INTRAVENOUS | Status: AC
  Administered 2021-05-09 – 2021-05-10 (×4): 10 meq via INTRAVENOUS
  Filled 2021-05-09 (×5): qty 50

## 2021-05-09 MED ORDER — DIGOXIN 0.25 MG/ML IJ SOLN
0.2500 mg | Freq: Once | INTRAMUSCULAR | Status: AC
  Administered 2021-05-09: 0.25 mg via INTRAVENOUS
  Filled 2021-05-09: qty 2

## 2021-05-09 MED ORDER — ALBUMIN HUMAN 25 % IV SOLN
12.5000 g | Freq: Once | INTRAVENOUS | Status: AC
  Administered 2021-05-09: 12.5 g via INTRAVENOUS
  Filled 2021-05-09: qty 50

## 2021-05-09 MED ORDER — FUROSEMIDE 10 MG/ML IJ SOLN
8.0000 mg/h | INTRAVENOUS | Status: DC
  Administered 2021-05-09 – 2021-05-12 (×4): 8 mg/h via INTRAVENOUS
  Filled 2021-05-09 (×4): qty 20

## 2021-05-09 MED ORDER — DIGOXIN 0.25 MG/ML IJ SOLN: 0.1250 mg | Freq: Every day | INTRAMUSCULAR | Status: DC

## 2021-05-09 MED ORDER — MAGNESIUM SULFATE 2 GM/50ML IV SOLN
2.0000 g | Freq: Once | INTRAVENOUS | Status: AC
  Administered 2021-05-09: 2 g via INTRAVENOUS
  Filled 2021-05-09: qty 50

## 2021-05-09 MED ORDER — INSULIN ASPART 100 UNIT/ML IJ SOLN
0.0000 [IU] | Freq: Four times a day (QID) | INTRAMUSCULAR | Status: DC
Start: 2021-05-09 — End: 2021-05-14
  Administered 2021-05-11 – 2021-05-12 (×6): 1 [IU] via SUBCUTANEOUS
  Administered 2021-05-13: 2 [IU] via SUBCUTANEOUS
  Administered 2021-05-13 – 2021-05-14 (×3): 1 [IU] via SUBCUTANEOUS

## 2021-05-09 MED ORDER — DIGOXIN 125 MCG PO TABS: 0.1250 mg | ORAL_TABLET | Freq: Every day | ORAL | Status: DC

## 2021-05-09 MED ORDER — CHOLESTYRAMINE LIGHT 4 G PO PACK
4.0000 g | PACK | ORAL | Status: DC
  Administered 2021-05-09 – 2021-05-10 (×4): 4 g via ORAL
  Filled 2021-05-09 (×7): qty 1

## 2021-05-09 MED ORDER — TRAVASOL 10 % IV SOLN
INTRAVENOUS | Status: AC
  Filled 2021-05-09: qty 478.8

## 2021-05-09 MED ORDER — HEPARIN (PORCINE) 25000 UT/250ML-% IV SOLN
800.0000 [IU]/h | INTRAVENOUS | Status: DC
Start: 2021-05-09 — End: 2021-05-10
  Administered 2021-05-09: 800 [IU]/h via INTRAVENOUS
  Filled 2021-05-09: qty 250

## 2021-05-09 MED ORDER — DIGOXIN 125 MCG PO TABS
0.2500 mg | ORAL_TABLET | ORAL | Status: DC
  Administered 2021-05-09: 0.25 mg via ORAL
  Filled 2021-05-09: qty 2

## 2021-05-09 NOTE — Progress Notes (Signed)
Cortrak Tube Team Note:  Consult received to place a Cortrak feeding tube.   Pt unable to tolerate procedure well. Pt with tachycardia and low oxygen saturation. Cortrak tube unable to be advanced past 45cm; tube needs to be post pyloric. Recommend fluoroscopy guided nasogastric tube placement once pt is more stable.   Koleen Distance MS, RD, LDN Please refer to [facility] for RD and/or RD on-call/weekend/after hours pager

## 2021-05-09 NOTE — Progress Notes (Signed)
ANTICOAGULATION CONSULT NOTE - Follow Up Consult  Pharmacy Consult for apixaban > heparin  Indication: DVT  No Known Allergies  Patient Measurements: Height: 5\' 1"  (154.9 cm) Weight: 61.2 kg (134 lb 14.7 oz) IBW/kg (Calculated) : 47.8 HEPARIN DW (KG): 61.5   Vital Signs: Temp: 97.8 F (36.6 C) (11/09 1121) Temp Source: Oral (11/09 1121) BP: 107/71 (11/09 1100) Pulse Rate: 116 (11/09 1100)  Labs: Recent Labs    05/07/21 0855 05/07/21 1825 05/08/21 0520 05/08/21 1901 05/09/21 0033 05/09/21 0635  HGB 10.2*  --  9.2*  --   --  9.6*  HCT 27.6*  --  25.2*  --   --  27.7*  PLT 300  --  256  --   --  273  CREATININE 0.38*   < > 0.47 0.48 0.50 0.40*   < > = values in this interval not displayed.    Estimated Creatinine Clearance: 57.3 mL/min (A) (by C-G formula based on SCr of 0.4 mg/dL (L)).   Medications:  Scheduled:   Chlorhexidine Gluconate Cloth  6 each Topical Daily   cholestyramine light  4 g Oral 2 times per day on Mon Tue Wed Thu Fri Sat   [START ON 05/10/2021] digoxin  0.125 mg Oral Daily   digoxin  0.25 mg Oral Q4H   insulin aspart  0-9 Units Subcutaneous Q6H   mouth rinse  15 mL Mouth Rinse BID   megestrol  400 mg Oral BID   sodium chloride flush  10-40 mL Intracatheter Q12H   sodium chloride flush  3 mL Intravenous Q12H   spironolactone  12.5 mg Oral Daily   sucralfate  1 g Oral TID WC & HS    Assessment: 70 yo female with DVT and renal infarct on apixaban. She is noted s/p cardiac arrest 11/7 and and EF 40-45%.  She is being started on TPN for malabsorption and also changing to IV heparin. Possible cardiac cath when more stable -apixaban 10mg  given ~ 10am today -on heparin recently at 800 units/hr and heparin level was at goal on this rate -hg= 9.6 (low/stable)   Goal of Therapy:  Heparin level 0.3-0.7 units/ml aPTT 66-102 seconds Monitor platelets by anticoagulation protocol: Yes   Plan:  -Start heparin at 800 units/hr at 10pm  -Daily  aPTT/HL/CBC  Hildred Laser, PharmD Clinical Pharmacist **Pharmacist phone directory can now be found on [email or web address] ([email or web address]).  Listed under [place].

## 2021-05-09 NOTE — Progress Notes (Signed)
[facility] Gastroenterology Progress Note  Chiquitta Matty 70 y.o. June 13, 1951  CC:  Concern for protein-losing enteropathy in setting of diffuse body wall anasarca, severe protein calorie malnutrition, hypoalbuminemia-, mild ascites and large bilateral pleural effusions, 50 lb weight loss, diarrhea, nausea and vomiting.   Subjective: Patient lying flat in bed with 15 L nonrebreather in place, still tachypnea and appears uncomfortable. Patient states she continues to have bowel movements last one was this morning at 4.  Loose stools, some sediment in it, no hematochezia or melena. Patient last had cheese [place] yesterday evening has not had any food since that time, denies nausea or vomiting. Patient's had a difficult time from last night to this morning with worsening shortness of breath and tachycardia especially positional.  Patient has worsening shortness of breath and tachycardia in supine position. Patient continues to have swelling, states this is unchanged.  ROS  worsening shortness of breath, tachycardia worse with supine, unchanged anasarca, continuing diarrhea.  Denies nausea, vomiting, hematochezia, melena.  Objective: Vital signs in last 24 hours: Vitals:   05/09/21 1100 05/09/21 1121  BP: 107/71   Pulse: (!) 116   Resp: 17   Temp:  97.8 F (36.6 C)  SpO2: 99%     Physical Exam: General:   Alert, in NAD, cushingoid appearance to face from anasarca, exophthalmos. Heart:  tachycardia, Regular rhythm Pulm: Clear anteriorly; no wheezing, tachypnea, Non rebreather in place, 15 L Abdomen:  Soft,  protuberent , mild distention, hyperactive bowel sounds. mild tenderness in right  lower abdomen. Without guarding and Without rebound, Extremities:  Diffuse edema bilateral arms, trunk, and 1+ edema bilateral legs.  Neurologic:  Alert and  oriented x4;  grossly normal neurologically. Psych:  Alert and cooperative. Normal mood and affect.  Lab Results: Recent Labs    05/08/21 0520  05/08/21 1901 05/09/21 0033 05/09/21 0635  NA 131*   < > 133* 133*  K 3.1*   < > 4.9 4.6  CL 97*   < > 101 99  CO2 28   < > 28 26  GLUCOSE 159*   < > 132* 124*  BUN <5*   < > <5* 5*  CREATININE 0.47   < > 0.50 0.40*  CALCIUM 7.4*   < > 7.5* 7.8*  MG 2.0  --   --  1.9  PHOS 3.1  --   --  3.9   < > = values in this interval not displayed.   Recent Labs    05/07/21 0855 05/07/21 2343  AST 50* 29  ALT 11 16  ALKPHOS 89 96  BILITOT 0.6 0.8  PROT 3.4* 4.0*  ALBUMIN <1.5* 1.7*   Recent Labs    05/08/21 0520 05/09/21 0635  WBC 8.0 8.4  HGB 9.2* 9.6*  HCT 25.2* 27.7*  MCV 92.3 95.5  PLT 256 273   No results for input(s): LABPROT, INR in the last 72 hours.  Lab Results: Results for orders placed or performed during the hospital encounter of 05/02/21 (from the past 48 hour(s))  MRSA Next Gen by PCR, Nasal     Status: None   Collection Time: 05/07/21  1:27 PM   Specimen: Nasal Mucosa; Nasal Swab  Result Value Ref Range   MRSA by PCR Next Gen NOT DETECTED NOT DETECTED    Comment: (NOTE) The GeneXpert MRSA Assay (FDA approved for NASAL specimens only), is one component of a comprehensive MRSA colonization surveillance program. It is not intended to diagnose MRSA infection nor to guide  or monitor treatment for MRSA infections. Test performance is not FDA approved in patients less than 70 years old. Performed at [facility], [place] [address] [place], [place] [address]   Basic metabolic panel     Status: Abnormal   Collection Time: 05/07/21  6:25 PM  Result Value Ref Range   Sodium 133 (L) 135 - 145 mmol/L   Potassium <2.0 (LL) 3.5 - 5.1 mmol/L    Comment: CRITICAL RESULT CALLED TO, READ BACK BY AND VERIFIED WITH: LEE J,RN 05/07/21 2055 WAYK    Chloride 97 (L) 98 - 111 mmol/L   CO2 27 22 - 32 mmol/L   Glucose, Bld 206 (H) 70 - 99 mg/dL    Comment: Glucose reference range applies only to samples taken after fasting for at least 8 hours.   BUN <5 (L) 8 - 23 mg/dL    Creatinine, Ser 0.50 0.44 - 1.00 mg/dL   Calcium 7.4 (L) 8.9 - 10.3 mg/dL   GFR, Estimated >60 >60 mL/min    Comment: (NOTE) Calculated using the CKD-EPI Creatinine Equation (2021)    Anion gap 9 5 - 15    Comment: Performed at [facility] [address] [place], [place] [address]  Iron and TIBC     Status: Abnormal   Collection Time: 05/07/21  6:25 PM  Result Value Ref Range   Iron 20 (L) 28 - 170 ug/dL   TIBC NOT CALCULATED 250 - 450 ug/dL    Comment: TRANSFERRIN <70   Saturation Ratios NOT CALCULATED 10.4 - 31.8 %   UIBC NOT CALCULATED ug/dL    Comment: Performed at [facility] [address] [place], [place] [address]  Ferritin     Status: Abnormal   Collection Time: 05/07/21  6:25 PM  Result Value Ref Range   Ferritin 933 (H) 11 - 307 ng/mL    Comment: Performed at [facility], [place] [address], [place], [place] [address]  Sedimentation rate     Status: None   Collection Time: 05/07/21  6:25 PM  Result Value Ref Range   Sed Rate 6 0 - 22 mm/hr    Comment: Performed at [facility], [place] [address], [place], [place] [address]  C-reactive protein     Status: Abnormal   Collection Time: 05/07/21  6:25 PM  Result Value Ref Range   CRP 1.1 (H) <1.0 mg/dL    Comment: Performed at [facility], [place] [address] [place], [place] [address]  Magnesium     Status: Abnormal   Collection Time: 05/07/21  6:25 PM  Result Value Ref Range   Magnesium 2.6 (H) 1.7 - 2.4 mg/dL    Comment: Performed at [facility] [address] [place], [place] [address]  Magnesium     Status: None   Collection Time: 05/07/21 11:43 PM  Result Value Ref Range   Magnesium 2.2 1.7 - 2.4 mg/dL    Comment: Performed at [facility], [place] [address], [place], [place] [address]  Comprehensive metabolic panel     Status: Abnormal   Collection Time: 05/07/21 11:43 PM  Result Value Ref Range   Sodium 134 (L) 135 - 145 mmol/L   Potassium 2.5 (LL) 3.5 - 5.1 mmol/L     Comment: CRITICAL RESULT CALLED TO, READ BACK BY AND VERIFIED WITH: LEE J,RN 05/08/21 0018 WAYK    Chloride 99 98 - 111 mmol/L   CO2 29 22 -  32 mmol/L   Glucose, Bld 153 (H) 70 - 99 mg/dL    Comment: Glucose reference range applies only to samples taken after fasting for at least 8 hours.   BUN <5 (L) 8 - 23 mg/dL   Creatinine, Ser 0.48 0.44 - 1.00 mg/dL   Calcium 7.4 (L) 8.9 - 10.3 mg/dL   Total Protein 4.0 (L) 6.5 - 8.1 g/dL   Albumin 1.7 (L) 3.5 - 5.0 g/dL   AST 29 15 - 41 U/L   ALT 16 0 - 44 U/L   Alkaline Phosphatase 96 38 - 126 U/L   Total Bilirubin 0.8 0.3 - 1.2 mg/dL   GFR, Estimated >60 >60 mL/min    Comment: (NOTE) Calculated using the CKD-EPI Creatinine Equation (2021)    Anion gap 6 5 - 15    Comment: Performed at [facility], [place] [address] [place], [place] [address]  Basic metabolic panel     Status: Abnormal   Collection Time: 05/08/21  5:20 AM  Result Value Ref Range   Sodium 131 (L) 135 - 145 mmol/L   Potassium 3.1 (L) 3.5 - 5.1 mmol/L   Chloride 97 (L) 98 - 111 mmol/L   CO2 28 22 - 32 mmol/L   Glucose, Bld 159 (H) 70 - 99 mg/dL    Comment: Glucose reference range applies only to samples taken after fasting for at least 8 hours.   BUN <5 (L) 8 - 23 mg/dL   Creatinine, Ser 0.47 0.44 - 1.00 mg/dL   Calcium 7.4 (L) 8.9 - 10.3 mg/dL   GFR, Estimated >60 >60 mL/min    Comment: (NOTE) Calculated using the CKD-EPI Creatinine Equation (2021)    Anion gap 6 5 - 15    Comment: Performed at [facility] [address], [place], [place] [address]  CBC     Status: Abnormal   Collection Time: 05/08/21  5:20 AM  Result Value Ref Range   WBC 8.0 4.0 - 10.5 K/uL   RBC 2.73 (L) 3.87 - 5.11 MIL/uL   Hemoglobin 9.2 (L) 12.0 - 15.0 g/dL   HCT 25.2 (L) 36.0 - 46.0 %   MCV 92.3 80.0 - 100.0 fL   MCH 33.7 26.0 - 34.0 pg   MCHC 36.5 (H) 30.0 - 36.0 g/dL   RDW 16.0 (H) 11.5 - 15.5 %   Platelets 256 150 - 400 K/uL   nRBC 0.0 0.0 - 0.2 %    Comment:  Performed at [facility] [address], [place], [place] [address]  Magnesium     Status: None   Collection Time: 05/08/21  5:20 AM  Result Value Ref Range   Magnesium 2.0 1.7 - 2.4 mg/dL    Comment: Performed at [facility] [address], [place], [place] [address]  Phosphorus     Status: None   Collection Time: 05/08/21  5:20 AM  Result Value Ref Range   Phosphorus 3.1 2.5 - 4.6 mg/dL    Comment: Performed at [facility] [address], [place], [place] [address]  VITAMIN D 25 Hydroxy (Vit-D Deficiency, Fractures)     Status: None   Collection Time: 05/08/21 12:18 PM  Result Value Ref Range   Vit D, 25-Hydroxy 87.74 30 - 100 ng/mL    Comment: (NOTE) Vitamin D deficiency has been defined by the [place] practice guideline as a level of serum 25-OH  vitamin D less  than 20 ng/mL (1,2). The Endocrine Society went on to  further define vitamin D insufficiency as a level between 21 and 29  ng/mL (2).  1. IOM (Institute of Medicine). 2010. Dietary reference intakes for  calcium and D. [place]: The [employer]. 2. Holick MF, Binkley Dearing, Bischoff-Ferrari HA, et al. Evaluation,  treatment, and prevention of vitamin D deficiency: an Endocrine  Society clinical practice guideline, JCEM. 2011 Jul; 96(7): 1911-30.  Performed at [facility], [place] [address], [place], [place] [address]   C Difficile Quick Screen (NO PCR Reflex)     Status: None   Collection Time: 05/08/21  2:48 PM   Specimen: Per Rectum; Stool  Result Value Ref Range   C Diff antigen NEGATIVE NEGATIVE   C Diff toxin NEGATIVE NEGATIVE   C Diff interpretation No C. difficile detected.     Comment: Performed at [facility], [place] [address] [place], [place] [address]  Vitamin B12     Status: None   Collection Time: 05/08/21  4:49 PM  Result Value Ref Range   Vitamin B-12 743 180 - 914 pg/mL    Comment: (NOTE) This assay is not  validated for testing neonatal or myeloproliferative syndrome specimens for Vitamin B12 levels. Performed at [facility], [place] [address], [place], [place] [address]   Folate     Status: None   Collection Time: 05/08/21  4:49 PM  Result Value Ref Range   Folate 18.6 >5.9 ng/mL    Comment: Performed at [facility], [place] [address], [place], [place] [address]  Iron and TIBC     Status: None   Collection Time: 05/08/21  4:49 PM  Result Value Ref Range   Iron 40 28 - 170 ug/dL   TIBC NOT CALCULATED 250 - 450 ug/dL    Comment: TRANSFERRIN <70   Saturation Ratios NOT CALCULATED 10.4 - 31.8 %   UIBC NOT CALCULATED ug/dL    Comment: Performed at [facility] [address], [place], [place] [address]  Ferritin     Status: Abnormal   Collection Time: 05/08/21  4:49 PM  Result Value Ref Range   Ferritin 662 (H) 11 - 307 ng/mL    Comment: Performed at [facility], [place] [address] [place], [place] [address]  Reticulocytes     Status: Abnormal   Collection Time: 05/08/21  4:49 PM  Result Value Ref Range   Retic Ct Pct 2.3 0.4 - 3.1 %   RBC. 2.83 (L) 3.87 - 5.11 MIL/uL   Retic Count, Absolute 64.8 19.0 - 186.0 K/uL   Immature Retic Fract 14.5 2.3 - 15.9 %    Comment: Performed at [facility] [address], [place], [place] [address]  Transferrin     Status: Abnormal   Collection Time: 05/08/21  4:49 PM  Result Value Ref Range   Transferrin <70 (L) 192 - 382 mg/dL    Comment: REPEATED TO VERIFY Performed at [facility], [place] [address], [place], [place] [address]   Alpha-1-antitrypsin     Status: None   Collection Time: 05/08/21  4:49 PM  Result Value Ref Range   A-1 Antitrypsin, Ser 141 101 - 187 mg/dL    Comment: (NOTE) Performed At: [facility] [place], [place] [id number] Rush Farmer MD [id number]   Ceruloplasmin     Status: Abnormal   Collection Time: 05/08/21  4:49 PM  Result Value Ref Range    Ceruloplasmin 16.9 (L) 19.0 - 39.0 mg/dL    Comment: (NOTE) Performed At: [facility] [place], [place] [id number] Rush Farmer MD [id number]   Fibrinogen     Status: None   Collection Time: 05/08/21  4:49 PM  Result Value Ref Range   Fibrinogen 395 210 - 475 mg/dL    Comment: (NOTE) Fibrinogen results may be underestimated in patients receiving thrombolytic therapy. Performed at [facility], [place] [address] [place], [place] [address]   IgG, IgA, IgM     Status: Abnormal   Collection Time: 05/08/21  4:49 PM  Result Value Ref Range   IgG (Immunoglobin G), Serum 670 586 - 1,602 mg/dL   IgA 356 (H) 87 - 352 mg/dL   IgM (Immunoglobulin M), Srm 127 26 - 217 mg/dL    Comment: (NOTE) Performed At: [facility] [address] [place], [place] [id number] Rush Farmer MD [id number]   .Cooxemetry Panel (carboxy, met, total hgb, O2 sat)     Status: Abnormal   Collection Time: 05/08/21  5:40 PM  Result Value Ref Range   Total hemoglobin 9.9 (L) 12.0 - 16.0 g/dL   O2 Saturation 66.7 %   Carboxyhemoglobin 0.7 0.5 - 1.5 %   Methemoglobin 0.8 0.0 - 1.5 %    Comment: Performed at [facility] [address] [place], [place] [address]  Lactic acid, plasma     Status: None   Collection Time: 05/08/21  5:45 PM  Result Value Ref Range   Lactic Acid, Venous 1.5 0.5 - 1.9 mmol/L    Comment: Performed at [facility] [address], [place], [place] [address]  Potassium     Status: None   Collection Time: 05/08/21  7:01 PM  Result Value Ref Range   Potassium 4.9 3.5 - 5.1 mmol/L    Comment: Performed at [facility] [address] [place], [place] [address]  Basic metabolic panel     Status: Abnormal   Collection Time: 05/08/21  7:01 PM  Result Value Ref Range   Sodium 133 (L) 135 - 145 mmol/L   Potassium 4.8 3.5 - 5.1 mmol/L   Chloride 99 98 - 111 mmol/L   CO2 27 22 - 32 mmol/L   Glucose, Bld 157 (H) 70 - 99 mg/dL     Comment: Glucose reference range applies only to samples taken after fasting for at least 8 hours.   BUN <5 (L) 8 - 23 mg/dL   Creatinine, Ser 0.48 0.44 - 1.00 mg/dL   Calcium 7.7 (L) 8.9 - 10.3 mg/dL   GFR, Estimated >60 >60 mL/min    Comment: (NOTE) Calculated using the CKD-EPI Creatinine Equation (2021)    Anion gap 7 5 - 15    Comment: Performed at [facility] [address] [place], [place] [address]  Basic metabolic panel     Status: Abnormal   Collection Time: 05/09/21 12:33 AM  Result Value Ref Range   Sodium 133 (L) 135 - 145 mmol/L   Potassium 4.9 3.5 - 5.1 mmol/L   Chloride 101 98 - 111 mmol/L   CO2 28 22 - 32 mmol/L   Glucose, Bld 132 (H) 70 - 99 mg/dL    Comment: Glucose reference range applies only to samples taken after fasting for at least 8 hours.   BUN <5 (L) 8 - 23 mg/dL   Creatinine, Ser 0.50 0.44 - 1.00  mg/dL   Calcium 7.5 (L) 8.9 - 10.3 mg/dL   GFR, Estimated >60 >60 mL/min    Comment: (NOTE) Calculated using the CKD-EPI Creatinine Equation (2021)    Anion gap 4 (L) 5 - 15    Comment: Performed at [facility] [address] [place], [place] [address]  CBC     Status: Abnormal   Collection Time: 05/09/21  6:35 AM  Result Value Ref Range   WBC 8.4 4.0 - 10.5 K/uL   RBC 2.90 (L) 3.87 - 5.11 MIL/uL   Hemoglobin 9.6 (L) 12.0 - 15.0 g/dL   HCT 27.7 (L) 36.0 - 46.0 %   MCV 95.5 80.0 - 100.0 fL   MCH 33.1 26.0 - 34.0 pg   MCHC 34.7 30.0 - 36.0 g/dL   RDW 16.0 (H) 11.5 - 15.5 %   Platelets 273 150 - 400 K/uL   nRBC 0.0 0.0 - 0.2 %    Comment: Performed at [facility], [place] [address] [place], [place] [address]  Basic metabolic panel     Status: Abnormal   Collection Time: 05/09/21  6:35 AM  Result Value Ref Range   Sodium 133 (L) 135 - 145 mmol/L   Potassium 4.6 3.5 - 5.1 mmol/L   Chloride 99 98 - 111 mmol/L   CO2 26 22 - 32 mmol/L   Glucose, Bld 124 (H) 70 - 99 mg/dL    Comment: Glucose reference range applies only to samples  taken after fasting for at least 8 hours.   BUN 5 (L) 8 - 23 mg/dL   Creatinine, Ser 0.40 (L) 0.44 - 1.00 mg/dL   Calcium 7.8 (L) 8.9 - 10.3 mg/dL   GFR, Estimated >60 >60 mL/min    Comment: (NOTE) Calculated using the CKD-EPI Creatinine Equation (2021)    Anion gap 8 5 - 15    Comment: Performed at [facility] [address], [place] [employer] [place], [place] [address]  Magnesium     Status: None   Collection Time: 05/09/21  6:35 AM  Result Value Ref Range   Magnesium 1.9 1.7 - 2.4 mg/dL    Comment: Performed at [facility] [address] [place], [place] [address]  Phosphorus     Status: None   Collection Time: 05/09/21  6:35 AM  Result Value Ref Range   Phosphorus 3.9 2.5 - 4.6 mg/dL    Comment: Performed at [facility] [address] [place], [place] [address]    Assessment/Plan: Impression Nausea, vomiting, diarrhea with weight loss  Concerning for protein losing enteropathy             CT AB and pelvis 11/03 with normal pancreas, small intestines, large intestines, stomach.             Colonoscopy normal other than diverticula 2015  EGD 08/2020 negative H pylori/dysplasia.  Anasarca secondary to severe protein-calorie malnutrition Worsening shortness of breath, postural unable to sit up without worsening shortness of breath and tachycardia. DVT on eliquis Renal infarct VF/VT arrest secondary to electrolyte derangements. Chronic heart failure    Plan Patient is having worsening shortness of breath overnight and this morning, unable to sit up without significant shortness of breath and tachycardia. Patient's been unable to eat anything since yesterday evening, do believe at this time to help with severe protein calorie malnutrition we will consult interventional radiology to evaluate for postpyloric feeding tube.  With worsening respiratory status continue to hold off on  endoscopic evaluation at this time, had recent EGD 08/2020 with negative H pylori, and  positive gastritis. PPI discontinued and currently on carafate.   - alpha 1 antitrypsin and cermoplasmin- Negative - serum transferrin and fibrinogen-Negative  - Cdiff Negative.  -total serum IGA, IGG, IGM- IGA slightly elevated, unremarkable otherwise.   Continue colestipol 1 g twice daily Appreciate RD note, patient on TPN.  PENDING LABS - Celiac panel (TTG+IgA, Immunoglobulin A) -Fecal pancreatic elastase-PENDING - Giardia stool tests-PENDING - GI pathogen panel- pending   Vladimir Crofts PA-C 05/09/2021, 11:55 AM  Contact #  [phone]

## 2021-05-09 NOTE — Progress Notes (Signed)
Cardiology Progress Note  Patient ID: Debbie Barry MRN: [id number] DOB: 1951/02/24 Date of Encounter: 05/09/2021  Primary Cardiologist: Evalina Field, MD  Subjective   Chief Complaint: Shortness of breath  HPI: Tachypneic yesterday.  Chest x-ray concerning for pulmonary edema.  Remains volume overloaded.  Discussed case with critical care medicine.  Plans for diuresis.  ROS:  All other ROS reviewed and negative. Pertinent positives noted in the HPI.     Inpatient Medications  Scheduled Meds:  apixaban  10 mg Oral BID   Followed by   Derrill Memo ON 05/11/2021] apixaban  5 mg Oral BID   Chlorhexidine Gluconate Cloth  6 each Topical Daily   colestipol  1 g Oral BID   feeding supplement  1 Container Oral TID BM   mouth rinse  15 mL Mouth Rinse BID   megestrol  400 mg Oral BID   multivitamin with minerals  1 tablet Oral Daily   sodium chloride flush  10-40 mL Intracatheter Q12H   sodium chloride flush  3 mL Intravenous Q12H   spironolactone  12.5 mg Oral Daily   sucralfate  1 g Oral TID WC & HS   Continuous Infusions:  sodium chloride     thiamine injection Stopped (05/08/21 2213)   Followed by   Derrill Memo ON 05/10/2021] thiamine injection     PRN Meds: sodium chloride, acetaminophen, HYDROcodone-acetaminophen, lidocaine (PF), loperamide, melatonin, sodium chloride flush, sodium chloride flush   Vital Signs   Vitals:   05/09/21 0500 05/09/21 0600 05/09/21 0700 05/09/21 0756  BP: 127/80 116/78 123/88   Pulse: (!) 115 (!) 113 (!) 108   Resp: 17 14 16    Temp:    98.5 F (36.9 C)  TempSrc:    Oral  SpO2: 90% 100% 100%   Weight: 61.2 kg     Height:        Intake/Output Summary (Last 24 hours) at 05/09/2021 0814 Last data filed at 05/09/2021 0700 Gross per 24 hour  Intake 1161.27 ml  Output 750 ml  Net 411.27 ml   Last 3 Weights 05/09/2021 05/07/2021 05/06/2021  Weight (lbs) 134 lb 14.7 oz 136 lb 0.4 oz 134 lb 11.2 oz  Weight (kg) 61.2 kg 61.7 kg 61.1 kg       Telemetry  Overnight telemetry shows sinus tachycardia heart rate 120-140 bpm, which I personally reviewed.   EKG: Sinus tachycardia heart rate 104, QTc 518  Physical Exam   Vitals:   05/09/21 0500 05/09/21 0600 05/09/21 0700 05/09/21 0756  BP: 127/80 116/78 123/88   Pulse: (!) 115 (!) 113 (!) 108   Resp: 17 14 16    Temp:    98.5 F (36.9 C)  TempSrc:    Oral  SpO2: 90% 100% 100%   Weight: 61.2 kg     Height:        Intake/Output Summary (Last 24 hours) at 05/09/2021 0814 Last data filed at 05/09/2021 0700 Gross per 24 hour  Intake 1161.27 ml  Output 750 ml  Net 411.27 ml    Last 3 Weights 05/09/2021 05/07/2021 05/06/2021  Weight (lbs) 134 lb 14.7 oz 136 lb 0.4 oz 134 lb 11.2 oz  Weight (kg) 61.2 kg 61.7 kg 61.1 kg    Body mass index is 25.49 kg/m.   General: Ill-appearing Head: Atraumatic, normal size  Eyes: PEERLA, EOMI  Neck: JVD elevated up to earlobes Endocrine: No thryomegaly Cardiac: Normal S1, S2; tachycardia, no murmurs Lungs: Crackles at the lung bases Abd: Soft, nontender, no  hepatomegaly  Ext: 2+ pitting edema Musculoskeletal: No deformities, BUE and BLE strength normal and equal Skin: Warm and dry, no rashes   Neuro: Alert and oriented to person, place, time, and situation, CNII-XII grossly intact, no focal deficits  Psych: Normal mood and affect   Labs  High Sensitivity Troponin:   Recent Labs  Lab 05/02/21 1945 05/03/21 0304  TROPONINIHS 28* 32*     Cardiac EnzymesNo results for input(s): TROPONINI in the last 168 hours. No results for input(s): TROPIPOC in the last 168 hours.  Chemistry Recent Labs  Lab 05/02/21 1945 05/04/21 0238 05/07/21 0855 05/07/21 1825 05/07/21 2343 05/08/21 0520 05/08/21 1901 05/09/21 0033 05/09/21 0635  NA 132*   < > 138   < > 134*   < > 133* 133* 133*  K 4.0   < > 2.6*   < > 2.5*   < > 4.8  4.9 4.9 4.6  CL 103   < > 108   < > 99   < > 99 101 99  CO2 21*   < > 24   < > 29   < > 27 28 26   GLUCOSE 90   < >  74   < > 153*   < > 157* 132* 124*  BUN 13   < > <5*   < > <5*   < > <5* <5* 5*  CREATININE 0.65   < > 0.38*   < > 0.48   < > 0.48 0.50 0.40*  CALCIUM 7.9*   < > 5.5*   < > 7.4*   < > 7.7* 7.5* 7.8*  PROT 4.4*  --  3.4*  --  4.0*  --   --   --   --   ALBUMIN 1.5*  --  <1.5*  --  1.7*  --   --   --   --   AST 25  --  50*  --  29  --   --   --   --   ALT 17  --  11  --  16  --   --   --   --   ALKPHOS 128*  --  89  --  96  --   --   --   --   BILITOT 0.6  --  0.6  --  0.8  --   --   --   --   GFRNONAA >60   < > >60   < > >60   < > >60 >60 >60  ANIONGAP 8   < > 6   < > 6   < > 7 4* 8   < > = values in this interval not displayed.    Hematology Recent Labs  Lab 05/07/21 0855 05/08/21 0520 05/08/21 1649 05/09/21 0635  WBC 8.7 8.0  --  8.4  RBC 2.97* 2.73* 2.83* 2.90*  HGB 10.2* 9.2*  --  9.6*  HCT 27.6* 25.2*  --  27.7*  MCV 92.9 92.3  --  95.5  MCH 34.3* 33.7  --  33.1  MCHC 37.0* 36.5*  --  34.7  RDW 16.4* 16.0*  --  16.0*  PLT 300 256  --  273   BNP Recent Labs  Lab 05/03/21 0304 05/07/21 0900  BNP 1,679.7* 482.9*    DDimer No results for input(s): DDIMER in the last 168 hours.   Radiology  DG Chest Left Decubitus  Result Date: 05/07/2021 CLINICAL DATA:  Possible pneumothorax.  EXAM: CHEST - LEFT DECUBITUS COMPARISON:  Chest radiograph done earlier the same day. CT chest 05/03/2021. FINDINGS: No right-sided pneumothorax. Layering bilateral pleural effusions. Nodular density is seen in the right midlung zone. Correlation with CT 05/03/2021 is challenging due to a large pleural effusion and collapse/consolidation in the right lung on that study. IMPRESSION: 1. No right pneumothorax. 2. Nodular density in the right midlung zone. Recommend attention on follow-up. 3. Bilateral pleural effusions. Electronically Signed   By: Lorin Picket M.D.   On: 05/07/2021 13:15   DG CHEST PORT 1 VIEW  Result Date: 05/08/2021 CLINICAL DATA:  Of breath EXAM: PORTABLE CHEST 1 VIEW COMPARISON:   Portable exam 1654 hours compared to 05/07/2021 FINDINGS: RIGHT arm PICC line with tip projecting over cavoatrial junction. Enlargement of cardiac silhouette with pulmonary vascular congestion. Diffuse pulmonary infiltrate consistent with pulmonary edema. Layering pleural effusions, better appreciated on preceding lateral decubitus radiographs. BILATERAL lower lobe atelectasis versus consolidation without pneumothorax. Questioned nodular density on prior exam not visualized. Osseous demineralization. IMPRESSION: Enlargement of cardiac silhouette with pulmonary vascular congestion and questionable perihilar edema. Layering BILATERAL pleural effusions. Additional atelectasis versus consolidation of lower lobes. Electronically Signed   By: Lavonia Dana M.D.   On: 05/08/2021 17:10   DG CHEST PORT 1 VIEW  Result Date: 05/07/2021 CLINICAL DATA:  Cardiac arrest. EXAM: PORTABLE CHEST 1 VIEW COMPARISON:  05/03/2021 and CT chest 05/03/2021. FINDINGS: Trachea is midline. Heart is enlarged, stable. Thoracic aorta is calcified. Defibrillator pad overlies the mid chest. There is a vertically oriented linear density along the apical and lateral aspect of the right hemithorax but there may be parenchymal markings extending beyond margin. Interstitial prominence and indistinctness with bilateral pleural effusions. Left lower lobe collapse/consolidation. Degenerative changes in the shoulders. IMPRESSION: 1. Possible artifact versus small pneumothorax in the right hemithorax. Recommend left lateral decubitus view in further evaluation, as clinically indicated. These results will be called to the ordering clinician or representative by the Radiologist Assistant, and communication documented in the PACS or [employer]. 2. Congestive heart failure. 3. Left lower lobe collapse/consolidation. Difficult to exclude aspiration. Electronically Signed   By: Lorin Picket M.D.   On: 05/07/2021 08:55   ECHOCARDIOGRAM  COMPLETE  Result Date: 05/07/2021    ECHOCARDIOGRAM REPORT   Patient Name:   JOMARIE GELLIS Date of Exam: 05/07/2021 Medical Rec #:  [id number]          Height:       61.0 in Accession #:    [id number]         Weight:       136.0 lb Date of Birth:  08-11-1950         BSA:          1.603 m Patient Age:    33 years           BP:           126/66 mmHg Patient Gender: F                  HR:           93 bpm. Exam Location:  Inpatient Procedure: 2D Echo, Cardiac Doppler and Color Doppler Indications:    Cardiac arrest  History:        Patient has prior history of Echocardiogram examinations, most                 recent 04/21/2021. CHF, PAD, Arrythmias:Cardiac Arrest and  Ventricular Fibrillation, Signs/Symptoms:Chest Pain and Large                 pleural effusion; Risk Factors:Hypertension.  Sonographer:    Dustin Flock RDCS Referring Phys: [id number] [place]  1. Septal and apical hypokinesis . Left ventricular ejection fraction, by estimation, is 40 to 45%. The left ventricle has mildly decreased function. The left ventricle demonstrates regional wall motion abnormalities (see scoring diagram/findings for description). Left ventricular diastolic parameters were normal.  2. Right ventricular systolic function is normal. The right ventricular size is normal.  3. There is a persistent pleurs effusion and trivial pericardial effusion Also appears to be prominent epicardial fat seen over RV. Can consider f/u CT chest if clinically indicated to further assess. The pericardial effusion is lateral to the left ventricle.  4. The mitral valve is abnormal. Trivial mitral valve regurgitation. No evidence of mitral stenosis.  5. The aortic valve is tricuspid. There is mild calcification of the aortic valve. Aortic valve regurgitation is not visualized. Mild aortic valve sclerosis is present, with no evidence of aortic valve stenosis.  6. The inferior vena cava is normal in size with greater  than 50% respiratory variability, suggesting right atrial pressure of 3 mmHg. FINDINGS  Left Ventricle: Septal and apical hypokinesis. Left ventricular ejection fraction, by estimation, is 40 to 45%. The left ventricle has mildly decreased function. The left ventricle demonstrates regional wall motion abnormalities. The left ventricular internal cavity size was normal in size. There is no left ventricular hypertrophy. Left ventricular diastolic parameters were normal. Right Ventricle: The right ventricular size is normal. No increase in right ventricular wall thickness. Right ventricular systolic function is normal. Left Atrium: Left atrial size was normal in size. Right Atrium: Right atrial size was normal in size. Pericardium: There is a persistent pleurs effusion and trivial pericardial effusion Also appears to be prominent epicardial fat seen over RV. Can consider f/u CT chest if clinically indicated to further assess. Trivial pericardial effusion is present. The pericardial effusion is lateral to the left ventricle. Mitral Valve: The mitral valve is abnormal. Trivial mitral valve regurgitation. No evidence of mitral valve stenosis. Tricuspid Valve: The tricuspid valve is normal in structure. Tricuspid valve regurgitation is trivial. No evidence of tricuspid stenosis. Aortic Valve: The aortic valve is tricuspid. There is mild calcification of the aortic valve. Aortic valve regurgitation is not visualized. Mild aortic valve sclerosis is present, with no evidence of aortic valve stenosis. Pulmonic Valve: The pulmonic valve was normal in structure. Pulmonic valve regurgitation is not visualized. No evidence of pulmonic stenosis. Aorta: The aortic root is normal in size and structure. Venous: The inferior vena cava is normal in size with greater than 50% respiratory variability, suggesting right atrial pressure of 3 mmHg. IAS/Shunts: No atrial level shunt detected by color flow Doppler.  LEFT VENTRICLE PLAX 2D  LVIDd:         4.50 cm     Diastology LVIDs:         2.90 cm     LV e' medial:    4.57 cm/s LV PW:         1.00 cm     LV E/e' medial:  10.0 LV IVS:        1.00 cm     LV e' lateral:   5.77 cm/s LVOT diam:     2.00 cm     LV E/e' lateral: 7.9 LV SV:         52  LV SV Index:   32 LVOT Area:     3.14 cm  LV Volumes (MOD) LV vol d, MOD A4C: 73.0 ml LV vol s, MOD A4C: 35.5 ml LV SV MOD A4C:     73.0 ml RIGHT VENTRICLE RV Basal diam:  2.70 cm RV S prime:     11.10 cm/s TAPSE (M-mode): 1.5 cm LEFT ATRIUM             Index        RIGHT ATRIUM           Index LA diam:        2.30 cm 1.43 cm/m   RA Area:     10.50 cm LA Vol (A2C):   23.9 ml 14.91 ml/m  RA Volume:   20.30 ml  12.66 ml/m LA Vol (A4C):   39.9 ml 24.89 ml/m LA Biplane Vol: 31.6 ml 19.71 ml/m  AORTIC VALVE LVOT Vmax:   103.00 cm/s LVOT Vmean:  70.200 cm/s LVOT VTI:    0.164 m  AORTA Ao Root diam: 2.70 cm MITRAL VALVE MV Area (PHT): 5.62 cm    SHUNTS MV Decel Time: 135 msec    Systemic VTI:  0.16 m MV E velocity: 45.80 cm/s  Systemic Diam: 2.00 cm MV A velocity: 71.10 cm/s MV E/A ratio:  0.64 Jenkins Rouge MD Electronically signed by Jenkins Rouge MD Signature Date/Time: 05/07/2021/2:32:42 PM    Final    [place] EKG SITE RITE  Result Date: 05/07/2021 If Site Rite image not attached, placement could not be confirmed due to current cardiac rhythm.  US Abdomen Limited RUQ (LIVER/GB)  Result Date: 05/07/2021 CLINICAL DATA:  Ascites on CT scan dated 05/03/2021 EXAM: ULTRASOUND ABDOMEN LIMITED RIGHT UPPER QUADRANT COMPARISON:  None. FINDINGS: Gallbladder: No gallstones. Mild focal thickening of the gallbladder wall measuring up to 4 mm. The technologist reported positive sonographic Murphy's sign. Common bile duct: Diameter: 2 mm Liver: No focal lesion identified. Within normal limits in parenchymal echogenicity. Portal vein is patent on color Doppler imaging with normal direction of blood flow towards the liver. Other: Mild ascites IMPRESSION: 1.  Mild ascites.  2. Mild focal gallbladder wall thickening and positive sonographic Murphy sign, nonspecific findings. This may be sequela of chronic ascites and/or volume overload. Clinical correlation is suggested. Electronically Signed   By: Keane Police D.O.   On: 05/07/2021 15:54    Cardiac Studies  TTE 05/07/2021  1. Septal and apical hypokinesis . Left ventricular ejection fraction, by  estimation, is 40 to 45%. The left ventricle has mildly decreased  function. The left ventricle demonstrates regional wall motion  abnormalities (see scoring diagram/findings for  description). Left ventricular diastolic parameters were normal.   2. Right ventricular systolic function is normal. The right ventricular  size is normal.   3. There is a persistent pleurs effusion and trivial pericardial effusion  Also appears to be prominent epicardial fat seen over RV. Can consider f/u  CT chest if clinically indicated to further assess. The pericardial  effusion is lateral to the left  ventricle.   4. The mitral valve is abnormal. Trivial mitral valve regurgitation. No  evidence of mitral stenosis.   5. The aortic valve is tricuspid. There is mild calcification of the  aortic valve. Aortic valve regurgitation is not visualized. Mild aortic  valve sclerosis is present, with no evidence of aortic valve stenosis.   6. The inferior vena cava is normal in size with greater than 50%  respiratory variability, suggesting  right atrial pressure of 3 mmHg.   Patient Profile  70 year old female with history of hypertension, spinal stenosis iron overload, GI bleed who was admitted on 05/03/2021 for chest pain and anasarca.  Found to have new right renal infarct.  Cardiology consulted on 05/07/2021 after polymorphic ventricular tachycardia arrest.  Assessment & Plan   #Polymorphic ventricular tachycardia #Cardiac arrest #QTc prolongation -No further episodes of polymorphic ventricular tachycardia.  This was related to hypokalemia  and severe hypomagnesemia.  QTc interval has improved to 518.  She was placed briefly on isoproterenol until her QTC corrected. -I believe this was all related to electrolyte derangement.  No indication for ICD given secondary to ventricular tachycardia arrest. -Do not believe an ischemic valuation be warranted for this.  See discussion below of cardiomyopathy.  #Systolic heart failure, EF 40-45% #Acute hypoxic respiratory failure #Volume overload #Anasarca #Hypoalbuminemia -Shortness of breath and concerns for pulmonary edema yesterday.  We will repeat a limited echocardiogram today.  Her overall picture of volume overload has been more consistent with protein deficiency given her severe hypoalbuminemia. -Evaluated by GI.  I believe this is the next best step.  Does she have a protein-losing enteropathy?  Liver ultrasound without signs of cirrhosis. -Her IVC was collapsing on echo 2 days ago.  EF is roughly 40 to 45%.  I believe her EF is likely a bit low in the setting of recent cardiac arrest.  She has no complaints of chest pain or anything to suggest this is ischemic in nature.  Her polymorphic ventricular tachycardia was related to a magnesium of 0.8. -Discussed her case with critical care medicine.  They believe she is not absorbing.  Regardless she needs aggressive IV diuresis.  We discussed increasing this to 80 to 100 mg IV twice daily with albumin supplementation with this.  Her potassium is appropriate.  We started Aldactone and K as above for.  I believe we can start with aggressive diuresis. -The etiology of cardiomyopathy could either be stress related versus nutritional.  Discussed this with critical care medicine.  I do not believe this is an ischemic cardiomyopathy.  There is mention of wall motion normality but I believe this is all within limits. -If she continues to decline we could consider left and right heart catheterization.  For now I believe aggressive diuresis is the  answer. -From a cardiovascular standpoint given the severe nature of her hypoalbuminemia I believe proceeding with EGD and colonoscopy this admission may be warranted.  Gastroenterology would like to wait several weeks.  However this may be more urgent.  From a cardiovascular standpoint we can be available during her procedure if needed.  #Acute renal infarct #Right CFA DVT -Found to have acute right renal infarct on CT.  Etiology unclear.  No A. fib.  Echo without vegetation.  No symptoms to suggest endocarditis. -We will check blood cultures to make sure were not missing infection. -She is on Eliquis.  We will transition to heparin drip in case she ends up needing right and left heart catheterization.  We will discuss this with critical care medicine.  For questions or updates, please contact [place] Please consult [email or web address] for contact info under   CRITICAL CARE Performed by: [place] Bells T O'Neal  Total critical care time: 40 minutes. Critical care time was exclusive of separately billable procedures and treating other patients. Critical care was necessary to treat or prevent imminent or life-threatening deterioration. Critical care was time spent personally by me on the following activities: development  of treatment plan with patient and/or surrogate as well as nursing, discussions with consultants, evaluation of patient's response to treatment, examination of patient, obtaining history from patient or surrogate, ordering and performing treatments and interventions, ordering and review of laboratory studies, ordering and review of radiographic studies, pulse oximetry and re-evaluation of patient's condition.    Signed, Addison Naegeli. Audie Box, MD, [place]  05/09/2021 8:14 AM

## 2021-05-09 NOTE — Progress Notes (Signed)
Nutrition Follow-up  DOCUMENTATION CODES:   Severe malnutrition in context of chronic illness  INTERVENTION:   Spoke with CCM, recommend TPN until GI/respiratory issues improve.  Once able recommend post pyloric cortrak tube.  Pt is at very high risk of refeeding syndrome, has already started thiamine 10/8. Spoke with Pharmacy.   Additional vitamin labs pending:  Vitamin A, B1, B6, C, Copper, and Zinc (Pt has been on a MVI with minerals daily since 11/4)  D/C Boost Breeze po TID for now due to respiratory issues   High dose thiamine ordered by CCM   NUTRITION DIAGNOSIS:   Severe Malnutrition related to chronic illness (chronic N/V/D) as evidenced by severe muscle depletion, energy intake < or equal to 50% for > or equal to 1 month. Ongoing.   GOAL:   Patient will meet greater than or equal to 90% of their needs Progressing.   MONITOR:   PO intake, Supplement acceptance, Labs  REASON FOR ASSESSMENT:   Consult Assessment of nutrition requirement/status  ASSESSMENT:   70 yo female admitted with DVT R common femoral vein and R femoral vein, acute on chronic CHF with anasarca. PMH includes HTN, PAD, fatty liver, chronic back pain, spinal stenosis, PVD, iron overload, gastritis, gastric ulcers.  Seen by GI this am and post pyloric tube requested, pt did not tolerate and too unstable for IR placement today.  Per GI pt is having diarrhea with undigested pills in stool.  Pt states she does not want the feeding tube, it was too difficult. Discussed with pt and her husband importance of using GI tract as soon as she can tolerate a tube placement.   Pt with tachycardia, tachypneic today with concerns for fluid overload on IV albumin and lasix. Pt more comfortable lying flat at this time on 15 L non-rebreather.   Per GI normal TSH, IgG, IgM, IgA levels, sed rate, fibrinogen, negative cdiff. Other studies still pending.   11/3 s/p R thoracentesis   11/7 s/p V.tach/V. fib  arrest s/p CPR x 3 minutes  11/9 Spoke with CCM, recommend TPN as GI work up on going, also concern for respiratory status   Noted cardiac arrest 11/7 per MD likely due to abnormally low electrolytes and prolonged QTC.   GI has been consulted and they have ordered multiple labs/tests to include ceruloplasmin, ferritin, fibrogen, folate, Iga, IgG, Iron, TIBC, pancreatic elastase, reticulocytes, transferrin, vitamin A, and Vitamin B12. Evaluating for celiac disease, exocrine pancreatic insufficiency, stool for ova, parasite, giardia, cdiff, pathogen panel.    Medications reviewed and include: megace 400 mg BID, MVI with minerals, spironolactone, carafate  Albumin x 1 Lasix x 1  Thiamine 500 mg TID until 11/10 then 250 mg TID  Labs reviewed: Na 133 Vitamin D: 87 CBG's: 124-157  UOP: 750 ml  I&O: -2.2 L Mild to moderate edema to BUE and BLE    Diet Order:   Diet Order             Diet Heart Room service appropriate? Yes; Fluid consistency: Thin  Diet effective now                   EDUCATION NEEDS:   No education needs have been identified at this time  Skin:  Skin Assessment:  (non-pressure: sacrum, L buttocks) Skin Integrity Issues:: Other (Comment) Other: pretibial venous stasis ulcer  Last BM:  11/8 x 2 small  Height:   Ht Readings from Last 1 Encounters:  05/02/21 5\' 1"  (1.549 m)  Weight:   Wt Readings from Last 1 Encounters:  05/09/21 61.2 kg    BMI:  Body mass index is 25.49 kg/m.  Estimated Nutritional Needs:   Kcal:  1800-2000  Protein:  105-120  Fluid:  >1.8 L/day  Lockie Pares., RD, LDN, CNSC See AMiON for contact information

## 2021-05-09 NOTE — Progress Notes (Signed)
NAME:  Debbie Barry, MRN:  [id number], DOB:  [birth year], LOS: 6 ADMISSION DATE:  05/02/2021, CONSULTATION DATE:  11/7 REFERRING MD:  Dr. Pietro Cassis, CHIEF COMPLAINT:  VT/VF   History of Present Illness:  70 year old female with past medical history as below, which is significant for hypertension, spinal stenosis, gastric ulcers, and fatty liver.  She was recently admitted due to lower extremity weakness and was found to have spinal stenosis for which medical management was recommended.  She was discharged to SNF where course was relatively uneventful.  She presented again to [facility] emergency department on 11/3 with complaints of chest pain and palpitations.  Initial work-up significant for DVT and left-sided pleural effusion.  She also had diffuse anasarca.  This was all felt to be in the context of poor nutrition and low albumin.  She was treated with diuretics.  On 11/7 in the AM hours she suffered ventricular fibrillation cardiac arrest and underwent 3 minutes of CPR with return of spontaneous circulation prior to defibrillation. Then a few hours later she went into pulsatile VT, which was treated with cardioversion x 1. She was transferred to the ICU after cardiology consultation. K and mag were found to be low and supplementation was arranged. PCCM was consulted.   Pertinent  Medical History   has a past medical history of Fatty liver, GERD (gastroesophageal reflux disease), Hypertension, Osteomyelitis ([place]), Peripheral vascular disease ([place]), and Ulcer (08/14/20).   Significant Hospital Events: Including procedures, antibiotic start and stop dates in addition to other pertinent events   11/3 admitted with CP and palpitations.  Thoracentesis left for 500cc. 11/7 Cardiac arrest 11/8 developed respiratory distress with ST in the afternoon requiring escalation to non-rebreather.   Interim History / Subjective:  Increasing oxygen requirement. Diarrhea yesterday.   Objective   Blood  pressure 123/88, pulse (!) 108, temperature 98.5 F (36.9 C), temperature source Oral, resp. rate 16, height $RemoveBe'5\' 1"'KhGifikMh$  (1.549 m), weight 61.2 kg, SpO2 100 %.        Intake/Output Summary (Last 24 hours) at 05/09/2021 1005 Last data filed at 05/09/2021 0700 Gross per 24 hour  Intake 1041.27 ml  Output 750 ml  Net 291.27 ml    Filed Weights   05/06/21 0559 05/07/21 0549 05/09/21 0500  Weight: 61.1 kg 61.7 kg 61.2 kg    Examination: General: Elderly appearing female in NAD HENT: [place]/AT, PERRL, JVD appreciated with HOB as about 30 degrees Lungs: Diminished in the bases.  Cardiovascular: Tachy, regular, no MRG Abdomen: Soft, mild tenderness RMQ Extremities: No acute deformity, globally weak. 1+ edema in lower extremities. Significant anasarca in upper extremities and hips.  Neuro: alert oriented non-focal   Resolved Hospital Problem list     Assessment & Plan:   VF/VT arrest: secondary to electrolyte derangements in the setting of aggressive diuresis. Bradycardic PVC seemed to cause rhythm change. Patient regained consciousness post arrest and did not require intubation.  - ICU hemodynamic monitoring - Currently sinus - electrolyte replacement ongoing. Will add kphos and magnesium additionally.  - check ionized calcium - Isoproterenol off  Chronic HFrEF: LVEF 40-45%. IVC down on echo despite anasarca pointing away from cardiac cause.  Sinus tachycardia with associated dyspnea/hypoxia when HR spikes.  - Will attempt to diurese more aggressively today with albumin and lasix infusion.  - Spironolactone per cardiology - digoxin load - Follow electrolytes.   Anasarca: likely due to hypoalbuminemia. Fatty liver disease listed in history, RUQ [place] with no signs of cirrhosis. ESR and CRP  are negative. She describes poor PO intake due to susequent vomiting or diarrhea shortly after eating. Malabsorption issue? Possibly related to protonix, which was a new medication for her. Protein calorie  malnutrition - Dietary following, recommending cortrak, although with frequent vomiting and concern for malabsorption, perhaps makes sense to move forward with TPN for the time being.  - Megace - GI consulted for ? Malabsorption. Workup pending.  - In the mean time we will plan to start parenteral nutrition  Acute hypoxemic respiratory failure Bilateral pleural effusions: protein low and LDH marginally elevated, but no serum comparisons. Likely due to hypoalbuminemia as above.  - Supplemental O2. Currently venti mask 50% - Diuresis as above  DVT - Transition to heparin infusion as she have been having bowel movement with full pills noted by nursing staff. Likely not absorbing. Also she may end up needing LHC.    Acute R renal infarct source unclear - anticoagulation as above - ? Atrial fib history, per cards - Consider bubble study.   Best Practice (right click and "Reselect all SmartList Selections" daily)   Diet/type: Regular consistency (see orders) DVT prophylaxis: DOAC GI prophylaxis: PPI Lines: N/A Foley:  N/A Code Status:  full code Last date of multidisciplinary goals of care discussion $RemoveBeforeD'[ ]'[id number]$   Labs   CBC: Recent Labs  Lab 05/02/21 1945 05/04/21 0238 05/05/21 0403 05/07/21 0855 05/08/21 0520 05/09/21 0635  WBC 9.8 8.6 6.9 8.7 8.0 8.4  NEUTROABS 7.2  --   --   --   --   --   HGB 11.8* 12.2 10.7* 10.2* 9.2* 9.6*  HCT 32.2* 32.7* 29.9* 27.6* 25.2* 27.7*  MCV 93.1 91.3 92.9 92.9 92.3 95.5  PLT 317 304 310 300 256 273     Basic Metabolic Panel: Recent Labs  Lab 05/07/21 0855 05/07/21 1825 05/07/21 2343 05/08/21 0520 05/08/21 1901 05/09/21 0033 05/09/21 0635  NA 138 133* 134* 131* 133* 133* 133*  K 2.6* <2.0* 2.5* 3.1* 4.8  4.9 4.9 4.6  CL 108 97* 99 97* 99 101 99  CO2 $Re'24 27 29 28 27 28 26  'KuR$ GLUCOSE 74 206* 153* 159* 157* 132* 124*  BUN <5* <5* <5* <5* <5* <5* 5*  CREATININE 0.38* 0.50 0.48 0.47 0.48 0.50 0.40*  CALCIUM 5.5* 7.4* 7.4* 7.4* 7.7* 7.5*  7.8*  MG 0.8* 2.6* 2.2 2.0  --   --  1.9  PHOS 2.7  --   --  3.1  --   --  3.9    GFR: Estimated Creatinine Clearance: 57.3 mL/min (A) (by C-G formula based on SCr of 0.4 mg/dL (L)). Recent Labs  Lab 05/05/21 0403 05/07/21 0855 05/08/21 0520 05/08/21 1745 05/09/21 0635  WBC 6.9 8.7 8.0  --  8.4  LATICACIDVEN  --   --   --  1.5  --      Liver Function Tests: Recent Labs  Lab 05/02/21 1945 05/07/21 0855 05/07/21 2343  AST 25 50* 29  ALT $Re'17 11 16  'GTM$ ALKPHOS 128* 89 96  BILITOT 0.6 0.6 0.8  PROT 4.4* 3.4* 4.0*  ALBUMIN 1.5* <1.5* 1.7*    Recent Labs  Lab 05/02/21 1945  LIPASE 21    No results for input(s): AMMONIA in the last 168 hours.  ABG    Component Value Date/Time   TCO2 27 04/20/2021 0111   O2SAT 66.7 05/08/2021 1740      Coagulation Profile: No results for input(s): INR, PROTIME in the last 168 hours.  Cardiac Enzymes: No results  for input(s): CKTOTAL, CKMB, CKMBINDEX, TROPONINI in the last 168 hours.  HbA1C: Hgb A1c MFr Bld  Date/Time Value Ref Range Status  02/01/2021 03:09 PM 4.5 (L) 4.8 - 5.6 % Final    Comment:             Prediabetes: 5.7 - 6.4          Diabetes: >6.4          Glycemic control for adults with diabetes: <7.0     CBG: Recent Labs  Lab 05/07/21 0820  GLUCAP 88      Past Medical History:  She,  has a past medical history of Fatty liver, GERD (gastroesophageal reflux disease), Hypertension, Osteomyelitis ([place]), Peripheral vascular disease ([place]), and Ulcer (08/14/20).   Surgical History:   Past Surgical History:  Procedure Laterality Date   AMPUTATION Right 08/08/2017   Procedure: AMPUTATION RIGHT 3RD TOE;  Surgeon: Newt Minion, MD;  Location: [facility];  Service: Orthopedics;  Laterality: Right;   BIOPSY  08/14/2020   Procedure: BIOPSY;  Surgeon: Clarene Essex, MD;  Location: [facility];  Service: Endoscopy;;   Bunionectomy Right 2006     Right   Bunionectony Left 2006      August   COLONOSCOPY N/A 01/21/2014    Procedure: COLONOSCOPY;  Surgeon: Beryle Beams, MD;  Location: [facility];  Service: Endoscopy;  Laterality: N/A;   ESOPHAGOGASTRODUODENOSCOPY (EGD) WITH PROPOFOL N/A 08/14/2020   Procedure: ESOPHAGOGASTRODUODENOSCOPY (EGD) WITH PROPOFOL;  Surgeon: Clarene Essex, MD;  Location: [facility];  Service: Endoscopy;  Laterality: N/A;   IR THORACENTESIS ASP PLEURAL SPACE W/IMG GUIDE  05/03/2021   TUBAL LIGATION     WISDOM TOOTH EXTRACTION       Social History:   reports that she has never smoked. She has never used smokeless tobacco. She reports current alcohol use of about 5.0 standard drinks per week. She reports that she does not use drugs.   Family History:  Her family history includes Arthritis in her father and mother; Breast cancer in her mother; Cancer in her father and mother; Colon cancer in her mother; Heart Problems in her mother; Heart disease in her father and mother; Hypertension in her father and mother; Multiple myeloma in her father and mother.   Allergies No Known Allergies   Home Medications  Prior to Admission medications   Medication Sig Start Date End Date Taking? Authorizing Provider  acetaminophen (TYLENOL) 325 MG tablet Take 650 mg by mouth every 6 (six) hours as needed for moderate pain.   Yes [provider]  bismuth subsalicylate (PEPTO BISMOL) 262 MG/15ML suspension Take 30 mLs by mouth every 6 (six) hours as needed for indigestion.   Yes [provider]  calcium carbonate (TUMS EX) 750 MG chewable tablet Chew 1 tablet by mouth daily as needed for heartburn.   Yes [provider]  Cholecalciferol (VITAMIN D3) 5000 units CAPS Take 5,000 Units by mouth daily.   Yes [provider]  HYDROcodone-acetaminophen (NORCO) 5-325 MG tablet Take 1 tablet by mouth every 12 (twelve) hours as needed for moderate pain. 05/02/21  Yes Medina-Vargas, Monina C, NP  hydrOXYzine (VISTARIL) 25 MG capsule Take 1 capsule (25 mg total) by mouth 2 (two) times  daily as needed. Patient taking differently: Take 25 mg by mouth 2 (two) times daily as needed for anxiety. 04/26/21  Yes Medina-Vargas, Monina C, NP  Magnesium Hydroxide (MILK OF MAGNESIA PO) Take 30 mLs by mouth daily as needed (constipation).   Yes  [provider]  Multiple Vitamin (MULTIVITAMIN WITH MINERALS) TABS tablet Take 1 tablet by mouth daily.   Yes [provider]  ondansetron (ZOFRAN ODT) 4 MG disintegrating tablet Take 1 tablet (4 mg total) by mouth every 8 (eight) hours as needed for nausea or vomiting. 04/04/21  Yes Mariel Aloe, MD  pantoprazole (PROTONIX) 40 MG tablet Take 1 tablet (40 mg total) by mouth daily. 04/04/21  Yes Mariel Aloe, MD  potassium chloride SA (KLOR-CON) 20 MEQ tablet Take 1 tablet (20 mEq total) by mouth 2 (two) times daily. 02/14/21  Yes Magrinat, Virgie Dad, MD  sertraline (ZOLOFT) 25 MG tablet Take 1 tablet (25 mg total) by mouth daily. 02/01/21  Yes Ghumman, Ramandeep, NP  Tetrahydrozoline HCl (VISINE OP) Place 2 drops into both eyes daily as needed (for dry/irritated eyes).    Yes [provider]  feeding supplement (ENSURE ENLIVE / ENSURE PLUS) LIQD Take 237 mLs by mouth 3 (three) times daily between meals. Patient not taking: Reported on 05/03/2021 04/04/21   Mariel Aloe, MD  nystatin (MYCOSTATIN) 100000 UNIT/ML suspension SWISH 5 ML BY MOUTH AND SPIT OUT FOUR TIMES DAILY X 2 WEEKS FOR ORAL YEAST Patient not taking: Reported on 05/03/2021    [provider]          Georgann Housekeeper, AGACNP-BC [place]  See Amion for personal pager PCCM on call pager [phone] until 7pm. Please call [place] 7p-7a. [phone]  05/09/2021 10:05 AM

## 2021-05-09 NOTE — Progress Notes (Signed)
Pharmacy Electrolyte Replacement  Recent Labs:  Recent Labs    05/09/21 0635 05/09/21 1725  K 4.6 3.6  MG 1.9  --   PHOS 3.9  --   CREATININE 0.40* 0.45    Low Critical Values (K </= 2.5, Phos </= 1, Mg </= 1) Present: None  MD Contacted: n/a  Plan: Potassium chloride 64mEq IV x4 (total 40 mEq).  Follow-up BMET in AM  Sloan Leiter, PharmD, BCPS, BCCCP Clinical Pharmacist Please refer to [facility] for [place] numbers 05/09/2021, 6:58 PM

## 2021-05-09 NOTE — Progress Notes (Signed)
PHARMACY - TOTAL PARENTERAL NUTRITION CONSULT NOTE   Indication:  Intolerance to po intake, concern for malabsorption  Patient Measurements: Height: 5\' 1"  (154.9 cm) Weight: 61.2 kg (134 lb 14.7 oz) IBW/kg (Calculated) : 47.8 TPN AdjBW (KG): 52.3 Body mass index is 25.49 kg/m. Usual Weight: 60-65 kg  Assessment:  70 yo F admitted for DVT, acute on chronic CHF with anasarca. Patient had a cardiac arrest 11/9 likely due to electrolyte abnormalities. Patient's condition acutely worsened overnight 11/9 with concern for volume overload, N/V, and epigastric pain. Undigested/whole medications in bowel movements concerning for malabsorption.  Glucose / Insulin: CBGs <180, no SSI Electrolytes:  Na 133, K 4.6, Ca 7.8, Phos 3.9, Mg 1.9 (S/p repletion of all elytes on 11/8) Renal:  Scr 0.40 (stable), UOP 750 ml/24hr>>started on lasix gtt @8mg /hr Hepatic: 11/7 LFTS WNL, LBM 11/8 Intake / Output; MIVF: limited po intake since Feb 2022 GI Imaging: 11/3 CT abdomen: Mild ascites, marked diffuse body wall subQ edema GI Surgeries / Procedures:   Central access: PICC 3L placed 11/7 TPN start date: 11/9  Nutritional Goals: Goal TPN rate is 60 mL/hr (provides g of protein and kcals per day)  RD Assessment: Estimated Needs Total Energy Estimated Needs: 1800-2000 Total Protein Estimated Needs: 105-120 Total Fluid Estimated Needs: >1.8 L/day  Current Nutrition:  Cardiac Diet:eating <25% of meals since admission D/c Boost 2/2 respiratory issues  Plan:  Start TPN at 35 mL/hr at 1800 Electrolytes in TPN: Na 140mEq/L, K 26mEq/L, Ca 62mEq/L, Mg 36mEq/L, and Phos 104mmol/L. Cl:Ac 1:1 Add standard MVI and trace elements to TPN Initiate Sensitive q6h SSI and adjust as needed  Monitor TPN labs on Mon/Thurs Plan to transition to enteral nutrition as soon as possible>>bedside cortrack placement in progress Patient currently on high dose thiamine until 11/13 Follow up Vitamin A, B1, B6, C, Copper, and Zinc  for repletion On Furosemide drip, monitor K closely  Thank you for allowing pharmacy to be a part of this patient's care.  Donnald Garre, PharmD Clinical Pharmacist  Please check AMION for all [place] numbers After 10:00 PM, call [place] [phone]

## 2021-05-09 NOTE — Progress Notes (Signed)
  Echocardiogram 2D Echocardiogram has been performed.  Debbie Barry 05/09/2021, 2:05 PM

## 2021-05-10 ENCOUNTER — Inpatient Hospital Stay (HOSPITAL_COMMUNITY): Payer: Medicare HMO

## 2021-05-10 DIAGNOSIS — J9601 Acute respiratory failure with hypoxia: Secondary | ICD-10-CM

## 2021-05-10 DIAGNOSIS — R601 Generalized edema: Secondary | ICD-10-CM | POA: Diagnosis not present

## 2021-05-10 DIAGNOSIS — J90 Pleural effusion, not elsewhere classified: Secondary | ICD-10-CM | POA: Diagnosis not present

## 2021-05-10 DIAGNOSIS — I469 Cardiac arrest, cause unspecified: Secondary | ICD-10-CM | POA: Diagnosis not present

## 2021-05-10 DIAGNOSIS — I5021 Acute systolic (congestive) heart failure: Secondary | ICD-10-CM

## 2021-05-10 DIAGNOSIS — R9431 Abnormal electrocardiogram [ECG] [EKG]: Secondary | ICD-10-CM | POA: Diagnosis not present

## 2021-05-10 DIAGNOSIS — R609 Edema, unspecified: Secondary | ICD-10-CM

## 2021-05-10 LAB — GLIA (IGA/G) + TTG IGA
Antigliadin Abs, IgA: 6 units (ref 0–19)
Gliadin IgG: 1 units (ref 0–19)
Tissue Transglutaminase Ab, IgA: <2 U/mL (ref 0–3)

## 2021-05-10 LAB — COMPREHENSIVE METABOLIC PANEL
ALT: 14 U/L (ref 0–44)
AST: 22 U/L (ref 15–41)
Albumin: 1.9 g/dL — ABNORMAL LOW (ref 3.5–5.0)
Alkaline Phosphatase: 92 U/L (ref 38–126)
Anion gap: 8 (ref 5–15)
BUN: <5 mg/dL — ABNORMAL LOW (ref 8–23)
CO2: 30 mmol/L (ref 22–32)
Calcium: 7.9 mg/dL — ABNORMAL LOW (ref 8.9–10.3)
Chloride: 96 mmol/L — ABNORMAL LOW (ref 98–111)
Creatinine, Ser: 0.36 mg/dL — ABNORMAL LOW (ref 0.44–1.00)
GFR, Estimated: >60 mL/min (ref >60)
Glucose, Bld: 103 mg/dL — ABNORMAL HIGH (ref 70–99)
Potassium: 3.1 mmol/L — ABNORMAL LOW (ref 3.5–5.1)
Sodium: 134 mmol/L — ABNORMAL LOW (ref 135–145)
Total Bilirubin: 0.6 mg/dL (ref 0.3–1.2)
Total Protein: 4.3 g/dL — ABNORMAL LOW (ref 6.5–8.1)

## 2021-05-10 LAB — CBC
HCT: 23.6 % — ABNORMAL LOW (ref 36.0–46.0)
Hemoglobin: 8.3 g/dL — ABNORMAL LOW (ref 12.0–15.0)
MCH: 33.2 pg (ref 26.0–34.0)
MCHC: 35.2 g/dL (ref 30.0–36.0)
MCV: 94.4 fL (ref 80.0–100.0)
Platelets: 209 10*3/uL (ref 150–400)
RBC: 2.5 MIL/uL — ABNORMAL LOW (ref 3.87–5.11)
RDW: 16 % — ABNORMAL HIGH (ref 11.5–15.5)
WBC: 6.3 10*3/uL (ref 4.0–10.5)
nRBC: 0 % (ref 0.0–0.2)

## 2021-05-10 LAB — BASIC METABOLIC PANEL
Anion gap: 7 (ref 5–15)
BUN: <5 mg/dL — ABNORMAL LOW (ref 8–23)
CO2: 35 mmol/L — ABNORMAL HIGH (ref 22–32)
Calcium: 7.9 mg/dL — ABNORMAL LOW (ref 8.9–10.3)
Chloride: 95 mmol/L — ABNORMAL LOW (ref 98–111)
Creatinine, Ser: 0.49 mg/dL (ref 0.44–1.00)
GFR, Estimated: >60 mL/min (ref >60)
Glucose, Bld: 96 mg/dL (ref 70–99)
Potassium: 2.8 mmol/L — ABNORMAL LOW (ref 3.5–5.1)
Sodium: 137 mmol/L (ref 135–145)

## 2021-05-10 LAB — GIARDIA/CRYPTOSPORIDIUM EIA
Cryptosporidium EIA: NEGATIVE
Giardia Ag, Stl: NEGATIVE

## 2021-05-10 LAB — HEPARIN LEVEL (UNFRACTIONATED): Heparin Unfractionated: >1.1 IU/mL — ABNORMAL HIGH (ref 0.30–0.70)

## 2021-05-10 LAB — MAGNESIUM
Magnesium: 1.6 mg/dL — ABNORMAL LOW (ref 1.7–2.4)
Magnesium: 1.7 mg/dL (ref 1.7–2.4)

## 2021-05-10 LAB — VITAMIN B6: Vitamin B6: 4.5 ug/L (ref 3.4–65.2)

## 2021-05-10 LAB — VITAMIN B1: Vitamin B1 (Thiamine): 149.5 nmol/L (ref 66.5–200.0)

## 2021-05-10 LAB — APTT
aPTT: >200 seconds (ref 24–36)
aPTT: 138 seconds — ABNORMAL HIGH (ref 24–36)
aPTT: 38 seconds — ABNORMAL HIGH (ref 24–36)

## 2021-05-10 LAB — PHOSPHORUS: Phosphorus: 3.6 mg/dL (ref 2.5–4.6)

## 2021-05-10 LAB — GLUCOSE, CAPILLARY
Glucose-Capillary: 101 mg/dL — ABNORMAL HIGH (ref 70–99)
Glucose-Capillary: 115 mg/dL — ABNORMAL HIGH (ref 70–99)
Glucose-Capillary: 117 mg/dL — ABNORMAL HIGH (ref 70–99)
Glucose-Capillary: 120 mg/dL — ABNORMAL HIGH (ref 70–99)
Glucose-Capillary: 95 mg/dL (ref 70–99)

## 2021-05-10 LAB — TRIGLYCERIDES: Triglycerides: 61 mg/dL (ref <150)

## 2021-05-10 LAB — VITAMIN K1, SERUM: VITAMIN K1: 0.28 ng/mL (ref 0.10–2.20)

## 2021-05-10 MED ORDER — POTASSIUM CHLORIDE CRYS ER 20 MEQ PO TBCR
40.0000 meq | EXTENDED_RELEASE_TABLET | Freq: Two times a day (BID) | ORAL | Status: DC
  Administered 2021-05-10 (×2): 40 meq via ORAL
  Filled 2021-05-10 (×2): qty 2

## 2021-05-10 MED ORDER — LOSARTAN POTASSIUM 25 MG PO TABS
25.0000 mg | ORAL_TABLET | Freq: Every day | ORAL | Status: DC
  Administered 2021-05-10 – 2021-05-22 (×12): 25 mg via ORAL
  Filled 2021-05-10 (×12): qty 1

## 2021-05-10 MED ORDER — SPIRONOLACTONE 25 MG PO TABS
25.0000 mg | ORAL_TABLET | Freq: Every day | ORAL | Status: DC
  Administered 2021-05-10 – 2021-05-22 (×12): 25 mg via ORAL
  Filled 2021-05-10 (×12): qty 1

## 2021-05-10 MED ORDER — HEPARIN (PORCINE) 25000 UT/250ML-% IV SOLN
750.0000 [IU]/h | INTRAVENOUS | Status: DC
  Administered 2021-05-10: 650 [IU]/h via INTRAVENOUS
  Administered 2021-05-11: 750 [IU]/h via INTRAVENOUS
  Filled 2021-05-10: qty 250

## 2021-05-10 MED ORDER — POTASSIUM CHLORIDE 10 MEQ/50ML IV SOLN
10.0000 meq | INTRAVENOUS | Status: AC
  Administered 2021-05-10 – 2021-05-11 (×4): 10 meq via INTRAVENOUS
  Filled 2021-05-10 (×4): qty 50

## 2021-05-10 MED ORDER — TRACE MINERALS CU-MN-SE-ZN 300-55-60-3000 MCG/ML IV SOLN
INTRAVENOUS | Status: AC
  Filled 2021-05-10: qty 504

## 2021-05-10 MED ORDER — MAGNESIUM SULFATE 2 GM/50ML IV SOLN
2.0000 g | Freq: Once | INTRAVENOUS | Status: AC
  Administered 2021-05-10: 2 g via INTRAVENOUS
  Filled 2021-05-10: qty 50

## 2021-05-10 NOTE — Progress Notes (Signed)
ANTICOAGULATION CONSULT NOTE - Follow Up Consult  Pharmacy Consult for Heparin Indication: DVT  No Known Allergies  Patient Measurements: Height: 5\' 1"  (154.9 cm) Weight: 58.6 kg (129 lb 3 oz) IBW/kg (Calculated) : 47.8 HEPARIN DW (KG): 61.5 kg   Vital Signs: Temp: 99.1 F (37.3 C) (11/10 0749) Temp Source: Oral (11/10 0749) BP: 103/72 (11/10 0900) Pulse Rate: 106 (11/10 0915)  Labs: Recent Labs    05/08/21 0520 05/08/21 1901 05/09/21 0635 05/09/21 1725 05/10/21 0440 05/10/21 0914  HGB 9.2*  --  9.6*  --  8.3*  --   HCT 25.2*  --  27.7*  --  23.6*  --   PLT 256  --  273  --  209  --   APTT  --   --   --   --  >200* 138*  HEPARINUNFRC  --   --   --   --  >1.10*  --   CREATININE 0.47   < > 0.40* 0.45 0.36*  --    < > = values in this interval not displayed.    Estimated Creatinine Clearance: 56.1 mL/min (A) (by C-G formula based on SCr of 0.36 mg/dL (L)).   Medications:  Scheduled:   Chlorhexidine Gluconate Cloth  6 each Topical Daily   cholestyramine light  4 g Oral 2 times per day on Mon Tue Wed Thu Fri Sat   insulin aspart  0-9 Units Subcutaneous Q6H   losartan  25 mg Oral Daily   mouth rinse  15 mL Mouth Rinse BID   megestrol  400 mg Oral BID   potassium chloride  40 mEq Oral BID   sodium chloride flush  10-40 mL Intracatheter Q12H   sodium chloride flush  3 mL Intravenous Q12H   spironolactone  25 mg Oral Daily   sucralfate  1 g Oral TID WC & HS    Assessment: 69 yo female with DVT and renal infarct on apixaban. She is noted s/p cardiac arrest 11/7 and and EF 40-45%.  She is being started on TPN for malabsorption and also changing to IV heparin. Possible cardiac cath when more stable. Pharmacy consulted for heparin dosing.   Patient was previously on heparin at 800 units/hr and heparin level was at goal on this rate. Last dose of apixaban 10 mg given 11/9 at ~10am.   aPTT > 200 this morning, unsure if drawn appropriately per conversation with RN.  Ordered STAT redraw and aPTT remains supratherapeutic at 138, on heparin infusion at 800 units/hr. Hgb 8.3, plt 2019 - low stable. No line issues or signs/symptoms of bleeding noted per RN.   Goal of Therapy:  Heparin level 0.3-0.7 units/ml aPTT 66-102 seconds Monitor platelets by anticoagulation protocol: Yes   Plan:  Hold heparin for ~30 minutes, and decrease infusion rate to 650 units/hr.  Check ~6hr aPTT, HL Daily aPTT, HL, CBC Monitor for signs/symptoms of bleeding   Vance Peper, PharmD PGY1 Pharmacy Resident Phone [phone] 05/10/2021 10:28 AM   Please check AMION for all [place] phone numbers After 10:00 PM, call [place] [phone]

## 2021-05-10 NOTE — Consult Note (Signed)
   [facility] Inpatient Consult   05/10/2021  Debbie Barry 1950-12-11 [id number]  [place] [employer] [ACO] Patient: [employer]  Primary Care Provider:  Glendale Chard, MD [facility], Embedded  Briefly reviewed for LOS and high risk scores for unplanned readmission risk.  Patient remains at ICU level of care.  Plan: Follow for progress and post hospital needs for the Embedded provider chronic care management team.  Natividad Brood, RN BSN [place] [facility] Liaison  [phone] business mobile phone Toll free office [phone]  Fax number: [phone] [email or web address]

## 2021-05-10 NOTE — Progress Notes (Signed)
Cardiology Progress Note  Patient ID: Debbie Barry MRN: [id number] DOB: Jul 01, 1951 Date of Encounter: 05/10/2021  Primary Cardiologist: Evalina Field, MD  Subjective   Chief Complaint: Shortness of breath  HPI: Started on Lasix drip by critical care medicine.  Potassium and magnesium low.  Breathing is much improved.  Remains still volume overloaded.  Echocardiogram shows LV and RV function have further declined.  ROS:  All other ROS reviewed and negative. Pertinent positives noted in the HPI.     Inpatient Medications  Scheduled Meds:  Chlorhexidine Gluconate Cloth  6 each Topical Daily   cholestyramine light  4 g Oral 2 times per day on Mon Tue Wed Thu Fri Sat   digoxin  0.125 mg Intravenous Daily   insulin aspart  0-9 Units Subcutaneous Q6H   losartan  25 mg Oral Daily   mouth rinse  15 mL Mouth Rinse BID   megestrol  400 mg Oral BID   potassium chloride  40 mEq Oral BID   sodium chloride flush  10-40 mL Intracatheter Q12H   sodium chloride flush  3 mL Intravenous Q12H   spironolactone  25 mg Oral Daily   sucralfate  1 g Oral TID WC & HS   Continuous Infusions:  sodium chloride 10 mL/hr at 05/10/21 0700   furosemide (LASIX) 200 mg in dextrose 5% 100 mL (2mg /mL) infusion 8 mg/hr (05/10/21 0700)   heparin 800 Units/hr (05/10/21 0700)   magnesium sulfate bolus IVPB     thiamine injection Stopped (05/09/21 2329)   Followed by   thiamine injection     TPN ADULT (ION) 35 mL/hr at 05/10/21 0700   PRN Meds: sodium chloride, acetaminophen, HYDROcodone-acetaminophen, lidocaine (PF), loperamide, melatonin, sodium chloride flush, sodium chloride flush   Vital Signs   Vitals:   05/10/21 0600 05/10/21 0640 05/10/21 0700 05/10/21 0749  BP: 107/62  (!) 143/88   Pulse: 96  (!) 103   Resp: 12  14   Temp:  97.9 F (36.6 C)  99.1 F (37.3 C)  TempSrc:  Axillary  Oral  SpO2: 100%  96%   Weight:      Height:        Intake/Output Summary (Last 24 hours) at 05/10/2021  0809 Last data filed at 05/10/2021 0700 Gross per 24 hour  Intake 1141.54 ml  Output 4350 ml  Net -3208.46 ml   Last 3 Weights 05/10/2021 05/09/2021 05/09/2021  Weight (lbs) 129 lb 3 oz 134 lb 14.7 oz 134 lb 14.7 oz  Weight (kg) 58.6 kg 61.2 kg 61.2 kg      Telemetry  Overnight telemetry shows sinus tachycardia low 100s, which I personally reviewed.   Physical Exam   Vitals:   05/10/21 0600 05/10/21 0640 05/10/21 0700 05/10/21 0749  BP: 107/62  (!) 143/88   Pulse: 96  (!) 103   Resp: 12  14   Temp:  97.9 F (36.6 C)  99.1 F (37.3 C)  TempSrc:  Axillary  Oral  SpO2: 100%  96%   Weight:      Height:        Intake/Output Summary (Last 24 hours) at 05/10/2021 0809 Last data filed at 05/10/2021 0700 Gross per 24 hour  Intake 1141.54 ml  Output 4350 ml  Net -3208.46 ml    Last 3 Weights 05/10/2021 05/09/2021 05/09/2021  Weight (lbs) 129 lb 3 oz 134 lb 14.7 oz 134 lb 14.7 oz  Weight (kg) 58.6 kg 61.2 kg 61.2 kg    Body  mass index is 24.41 kg/m.   General: Well nourished, well developed, in no acute distress Head: Atraumatic, normal size  Eyes: PEERLA, EOMI  Neck: Supple, JVD 7 8 cm of water Endocrine: No thryomegaly Cardiac: Normal S1, S2; RRR; no murmurs, rubs, or gallops Lungs: Crackles at the lung bases Abd: Soft, nontender, no hepatomegaly  Ext: 1+ pitting edema Musculoskeletal: No deformities, BUE and BLE strength normal and equal Skin: Warm and dry, no rashes   Neuro: Alert and oriented to person, place, time, and situation, CNII-XII grossly intact, no focal deficits  Psych: Normal mood and affect   Labs  High Sensitivity Troponin:   Recent Labs  Lab 05/02/21 1945 05/03/21 0304  TROPONINIHS 28* 32*     Cardiac EnzymesNo results for input(s): TROPONINI in the last 168 hours. No results for input(s): TROPIPOC in the last 168 hours.  Chemistry Recent Labs  Lab 05/07/21 0855 05/07/21 1825 05/07/21 2343 05/08/21 0520 05/09/21 0635 05/09/21 1725  05/10/21 0440  NA 138   < > 134*   < > 133* 135 134*  K 2.6*   < > 2.5*   < > 4.6 3.6 3.1*  CL 108   < > 99   < > 99 100 96*  CO2 24   < > 29   < > 26 28 30   GLUCOSE 74   < > 153*   < > 124* 118* 103*  BUN <5*   < > <5*   < > 5* <5* <5*  CREATININE 0.38*   < > 0.48   < > 0.40* 0.45 0.36*  CALCIUM 5.5*   < > 7.4*   < > 7.8* 8.1* 7.9*  PROT 3.4*  --  4.0*  --   --   --  4.3*  ALBUMIN <1.5*  --  1.7*  --   --   --  1.9*  AST 50*  --  29  --   --   --  22  ALT 11  --  16  --   --   --  14  ALKPHOS 89  --  96  --   --   --  92  BILITOT 0.6  --  0.8  --   --   --  0.6  GFRNONAA >60   < > >60   < > >60 >60 >60  ANIONGAP 6   < > 6   < > 8 7 8    < > = values in this interval not displayed.    Hematology Recent Labs  Lab 05/08/21 0520 05/08/21 1649 05/09/21 0635 05/10/21 0440  WBC 8.0  --  8.4 6.3  RBC 2.73* 2.83* 2.90* 2.50*  HGB 9.2*  --  9.6* 8.3*  HCT 25.2*  --  27.7* 23.6*  MCV 92.3  --  95.5 94.4  MCH 33.7  --  33.1 33.2  MCHC 36.5*  --  34.7 35.2  RDW 16.0*  --  16.0* 16.0*  PLT 256  --  273 209   BNP Recent Labs  Lab 05/07/21 0900  BNP 482.9*    DDimer No results for input(s): DDIMER in the last 168 hours.   Radiology  DG CHEST PORT 1 VIEW  Result Date: 05/08/2021 CLINICAL DATA:  Of breath EXAM: PORTABLE CHEST 1 VIEW COMPARISON:  Portable exam 1654 hours compared to 05/07/2021 FINDINGS: RIGHT arm PICC line with tip projecting over cavoatrial junction. Enlargement of cardiac silhouette with pulmonary vascular congestion. Diffuse pulmonary infiltrate consistent with  pulmonary edema. Layering pleural effusions, better appreciated on preceding lateral decubitus radiographs. BILATERAL lower lobe atelectasis versus consolidation without pneumothorax. Questioned nodular density on prior exam not visualized. Osseous demineralization. IMPRESSION: Enlargement of cardiac silhouette with pulmonary vascular congestion and questionable perihilar edema. Layering BILATERAL pleural  effusions. Additional atelectasis versus consolidation of lower lobes. Electronically Signed   By: Lavonia Dana M.D.   On: 05/08/2021 17:10   ECHOCARDIOGRAM LIMITED  Result Date: 05/09/2021    ECHOCARDIOGRAM LIMITED REPORT   Patient Name:   Debbie Barry Date of Exam: 05/09/2021 Medical Rec #:  [id number]          Height:       61.0 in Accession #:    [id number]         Weight:       134.9 lb Date of Birth:  1950-07-09         BSA:          1.598 m Patient Age:    49 years           BP:           135/93 mmHg Patient Gender: F                  HR:           117 bpm. Exam Location:  Inpatient Procedure: 2D Echo Indications:    acute systolic chf  History:        Patient has prior history of Echocardiogram examinations, most                 recent 05/07/2021.  Sonographer:    Johny Chess RDCS Referring Phys: [id number] [place]  1. Left ventricular ejection fraction, by estimation, is 25 to 30%. The left ventricle has severely decreased function. The left ventricle demonstrates global hypokinesis. Left ventricular diastolic parameters are consistent with Grade I diastolic dysfunction (impaired relaxation).  2. Right ventricular systolic function is moderately reduced. The right ventricular size is mildly enlarged. Tricuspid regurgitation signal is inadequate for assessing PA pressure.  3. The mitral valve is normal in structure. Mild mitral valve regurgitation. No evidence of mitral stenosis.  4. The aortic valve is tricuspid. Aortic valve regurgitation is not visualized. Mild aortic valve sclerosis is present, with no evidence of aortic valve stenosis.  5. The inferior vena cava is dilated in size with <50% respiratory variability, suggesting right atrial pressure of 15 mmHg.  6. There is a left pleural effusion.  7. Limited echo, biventricular dysfunction. FINDINGS  Left Ventricle: Left ventricular ejection fraction, by estimation, is 25 to 30%. The left ventricle has severely  decreased function. The left ventricle demonstrates global hypokinesis. The left ventricular internal cavity size was normal in size. There is no left ventricular hypertrophy. Left ventricular diastolic parameters are consistent with Grade I diastolic dysfunction (impaired relaxation). Right Ventricle: The right ventricular size is mildly enlarged. Right ventricular systolic function is moderately reduced. Tricuspid regurgitation signal is inadequate for assessing PA pressure. Pericardium: There is a left pleural effusion. There is no evidence of pericardial effusion. Mitral Valve: The mitral valve is normal in structure. Mild mitral annular calcification. Mild mitral valve regurgitation. No evidence of mitral valve stenosis. Tricuspid Valve: The tricuspid valve is normal in structure. Tricuspid valve regurgitation is not demonstrated. Aortic Valve: The aortic valve is tricuspid. Aortic valve regurgitation is not visualized. Mild aortic valve sclerosis is present, with no evidence of aortic valve stenosis. Venous: The inferior vena  cava is dilated in size with less than 50% respiratory variability, suggesting right atrial pressure of 15 mmHg. LEFT VENTRICLE PLAX 2D LVIDd:         5.00 cm     Diastology LVIDs:         4.00 cm     LV e' lateral: 7.83 cm/s LV PW:         0.80 cm LV IVS:        0.60 cm  LV Volumes (MOD) LV vol d, MOD A4C: 71.2 ml LV vol s, MOD A4C: 46.4 ml LV SV MOD A4C:     71.2 ml IVC IVC diam: 2.30 cm LEFT ATRIUM         Index LA diam:    3.40 cm 2.13 cm/m  AORTIC VALVE LVOT Vmax:   86.80 cm/s LVOT Vmean:  63.000 cm/s LVOT VTI:    0.122 m  AORTA Ao Asc diam: 2.90 cm  SHUNTS Systemic VTI: 0.12 m Angelica Electronically signed by Franki Monte Signature Date/Time: 05/09/2021/6:52:14 PM    Final     Cardiac Studies  TTE 05/09/2021  1. Left ventricular ejection fraction, by estimation, is 25 to 30%. The  left ventricle has severely decreased function. The left ventricle  demonstrates global  hypokinesis. Left ventricular diastolic parameters are  consistent with Grade I diastolic  dysfunction (impaired relaxation).   2. Right ventricular systolic function is moderately reduced. The right  ventricular size is mildly enlarged. Tricuspid regurgitation signal is  inadequate for assessing PA pressure.   3. The mitral valve is normal in structure. Mild mitral valve  regurgitation. No evidence of mitral stenosis.   4. The aortic valve is tricuspid. Aortic valve regurgitation is not  visualized. Mild aortic valve sclerosis is present, with no evidence of  aortic valve stenosis.   5. The inferior vena cava is dilated in size with <50% respiratory  variability, suggesting right atrial pressure of 15 mmHg.   6. There is a left pleural effusion.   7. Limited echo, biventricular dysfunction.   Patient Profile  70 year old female with history of hypertension, spinal stenosis iron overload, GI bleed who was admitted on 05/03/2021 for chest pain and anasarca.  Found to have new right renal infarct.  Cardiology consulted on 05/07/2021 after polymorphic ventricular tachycardia arrest.  Assessment & Plan   #Polymorphic ventricular tachycardia #Cardiac arrest #QTc prolongation -Suffered polymorphic ventricular tachycardia arrest in the setting of severe QTC prolongation on 05/07/2021.  QTC was over 580. -Was placed briefly on isoproterenol.  Electrolyte derangements have been improved. -No further VT. -No indication for ICD as this is secondary VT. -Close monitoring of electrolytes. -Now on Lasix drip.  I have increased her potassium supplement to 40 mEq twice daily.  I have also replaced her magnesium.  We need to make sure these values remain above 4 and 2 respectively.  #Systolic heart failure, EF 25 to 30% #Acute hypoxic respiratory failure #Volume overload #Anasarca #Hypoalbuminemia -Repeat echo shows EF is globally reduced at 25-30%.  This is declined from 40 to 45%. -This is all  occurred in the setting of cardiac arrest secondary to polymorphic ventricular tachycardia in the setting of severe hypomagnesemia. -She has had hypoalbuminemia for months.  She is grossly volume overloaded from this.  No signs of cirrhosis. -Critical care medicine is evaluating her and questions possible protein-losing enteropathy. -For now I believe her cardiomyopathy is likely secondary to critical illness.  Her EF is globally down.  I do not believe  she has significant CAD. -She will likely ultimately need left and right heart catheterization.  For now I believe we should continue with medical management. -I have added losartan 25 mg daily.  Could be transition to [place] later.  We will increase her Aldactone to 25 mg daily to help with her potassium magnesium. -We will likely add a beta-blocker tomorrow.  We will discontinue digoxin.  No indication for this. -Etiology of her cardiomyopathy could be stress related.  This could also be nutritional. -Agree with transition to heparin.  Once she is more euvolemic we will likely pursue left and right heart catheterization. -GI is working up her for malabsorption.  #Acute renal infarct #Right lower extremity DVT -Found to have DVT this admission. -On heparin drip for now.  -History of right renal infarct.  Unclear etiology.  Repeat study was without bubble study.  I do not believe she has an intracardiac shunt. -No A. fib on monitor. -Unclear if further evaluation for cardioembolic source would change her management.  She will be anticoagulated regardless given DVT.  Given poor mobility I suspect she will be anticoagulated indefinitely.  For questions or updates, please contact [place] Please consult [email or web address] for contact info under   Time Spent with Patient: I have spent a total of 35 minutes with patient reviewing hospital notes, telemetry, EKGs, labs and examining the patient as well as establishing an assessment and plan that was  discussed with the patient.  > 50% of time was spent in direct patient care.    Signed, Addison Naegeli. Audie Box, MD, [place]  05/10/2021 8:09 AM

## 2021-05-10 NOTE — Progress Notes (Signed)
PHARMACY - TOTAL PARENTERAL NUTRITION CONSULT NOTE   Indication:  Intolerance to po intake, concern for malabsorption  Patient Measurements: Height: 5\' 1"  (154.9 cm) Weight: 58.6 kg (129 lb 3 oz) IBW/kg (Calculated) : 47.8 TPN AdjBW (KG): 52.3 Body mass index is 24.41 kg/m. Usual Weight: 60-65 kg  Assessment:  70 yo F admitted for DVT, acute on chronic CHF with anasarca. Patient had a cardiac arrest 11/9 likely due to electrolyte abnormalities. Patient's condition acutely worsened overnight 11/9 with concern for volume overload, N/V, and epigastric pain. Undigested/whole medications in bowel movements concerning for malabsorption.  Glucose / Insulin: CBGs <180, sSSI (0u/24hr) Electrolytes:  Na 134, K 3.1, Ca 7.9, Phos 3.6, Mg 1.6 Renal:  Scr 0.36 (stable), UOP 370 ml/24hr>>on lasix gtt @8mg /hr Hepatic: 11/7 LFTS WNL, LBM 11/9 Intake / Output; MIVF: limited po intake since Feb 2022 GI Imaging: 11/3 CT abdomen: Mild ascites, marked diffuse body wall subQ edema GI Surgeries / Procedures: Vitamins: 11/8 Copper 80 (WNL)  Central access: PICC 3L placed 11/7 TPN start date: 11/9  Nutritional Goals: Goal TPN rate 65 ml/hr (109gm AA, 1863 kcal)  RD Assessment: Estimated Needs Total Energy Estimated Needs: 1800-2000 Total Protein Estimated Needs: 105-120 Total Fluid Estimated Needs: >1.8 L/day  Current Nutrition:  Cardiac Diet:eating <25% of meals since admission D/c Boost 2/2 respiratory issues  Plan:  Concentrated TPN on 11/10 Start TPN at 45 ml/hr @1800  (goal rate 65 ml/hr, 109gm AA, 1863 kcal) Electrolytes in TPN: Inc Na 143mEq/L, Inc K 67mEq/L, Ca 52mEq/L, Inc Mg 19mEq/L, and Decrease Phos to 75mmol/L. Adj Cl:Ac 2:1 -Scheduled K 40 mEq BID (while on lasix drip), Mg x2gm 11/10 Add standard MVI and trace elements to TPN Initiate Sensitive q6h SSI and adjust as needed. Consider discontinuing once at goal rate if CBGs remain <180 Monitor TPN labs on Mon/Thurs Plan to  transition to enteral nutrition as soon as possible>>bedside cortrack placement in progress Patient currently on high dose thiamine until 11/13 Follow up Vitamin A, B1, B6, C, Copper, and Zinc for repletion On Furosemide drip, monitor K closely  Thank you for allowing pharmacy to be a part of this patient's care.  Donnald Garre, PharmD Clinical Pharmacist  Please check AMION for all [place] numbers After 10:00 PM, call [place] [phone]

## 2021-05-10 NOTE — Progress Notes (Signed)
LUE venous duplex has been completed.  Preliminary results given to [facility], RN at [phone].

## 2021-05-10 NOTE — Progress Notes (Signed)
NAME:  Debbie Barry, MRN:  [id number], DOB:  June 07, 1951, LOS: 7 ADMISSION DATE:  05/02/2021, CONSULTATION DATE:  11/7 REFERRING MD:  Dr. Pietro Cassis, CHIEF COMPLAINT:  VT/VF   History of Present Illness:  70 year old female with past medical history as below, which is significant for hypertension, spinal stenosis, gastric ulcers, and fatty liver.  She was recently admitted due to lower extremity weakness and was found to have spinal stenosis for which medical management was recommended.  She was discharged to SNF where course was relatively uneventful.  She presented again to [facility] emergency department on 11/3 with complaints of chest pain and palpitations.  Initial work-up significant for DVT and left-sided pleural effusion.  She also had diffuse anasarca.  This was all felt to be in the context of poor nutrition and low albumin.  She was treated with diuretics.  On 11/7 in the AM hours she suffered ventricular fibrillation cardiac arrest and underwent 3 minutes of CPR with return of spontaneous circulation prior to defibrillation. Then a few hours later she went into pulsatile VT, which was treated with cardioversion x 1. She was transferred to the ICU after cardiology consultation. K and mag were found to be low and supplementation was arranged. PCCM was consulted.   Pertinent  Medical History   has a past medical history of Fatty liver, GERD (gastroesophageal reflux disease), Hypertension, Osteomyelitis ([place]), Peripheral vascular disease ([place]), and Ulcer (08/14/20).   Significant Hospital Events: Including procedures, antibiotic start and stop dates in addition to other pertinent events   11/3 admitted with CP and palpitations.  Thoracentesis left for 500cc. 11/7 Cardiac arrest 11/8 developed respiratory distress with ST in the afternoon requiring escalation to non-rebreather. 11/9 TPN started. GI consulted for malnutrition. Working dx Severe hypoalbuminemia, anasarca associated with  nausea, vomiting, diarrhea and weight loss suspected to be secondary to protein-losing enteropathy 11/10 still hypoxic but tolerating diet. Aldactone  increased =. ARB added.  Interim History / Subjective:  Feels better   Objective   Blood pressure 110/77, pulse (Abnormal) 103, temperature 98.8 F (37.1 C), temperature source Oral, resp. rate 16, height 5\' 1"  (1.549 m), weight 58.6 kg, SpO2 97 %.    FiO2 (%):  [50 %] 50 %   Intake/Output Summary (Last 24 hours) at 05/10/2021 1209 Last data filed at 05/10/2021 1100 Gross per 24 hour  Intake 1348.41 ml  Output 4650 ml  Net -3301.59 ml   Filed Weights   05/09/21 0500 05/09/21 1151 05/10/21 0442  Weight: 61.2 kg 61.2 kg 58.6 kg    Examination: General pleasant 70 year old female sitting up in bed eating lunch HENT NCAT MMM Pulm faint posterior rales. No accessory use currently on 15 lpm Card rrr Abd soft  Ext anasarca  Neuro intact    Resolved Hospital Problem list   VT (torsades) arrest: econdary to electrolyte derangements in the setting of aggressive diuresis. Bradycardic PVC seemed to cause rhythm change. Patient regained consciousness post arrest and did not require intubation.   Assessment & Plan:    Acute on chronic HFrEF: LVEF 40-45%. IVC down on echo despite anasarca pointing away from cardiac cause.  Sinus tachycardia, polymorphic VT and prolonged QTc with associated dyspnea/hypoxia when HR spikes.  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 Likely due to hypoalbuminemia as above.  Plan Cont IV diuresis as long ab BP/BUN/cr tolerate Am cxr  Wean oxygen  IS   Fluid and electrolyte imbalance: Hypokalemia, hyponatremia, hypomagnesemia Plan Aggressively replace especially w/ on-going diuresis   DVT Plan Cont IV heparin given abd absorption issues   Acute R renal infarct source unclear - ? Atrial fib history, per cards Plan Cont ac   Best Practice (right click and "Reselect all SmartList Selections" daily)   Diet/type: Regular consistency (see orders) DVT prophylaxis: DOAC GI prophylaxis: PPI Lines: N/A Foley:  N/A Code Status:  full code Last date of multidisciplinary goals of care discussion [ ]       Erick Colace ACNP-BC [place] Pager # [phone] OR # [phone] if no answer

## 2021-05-10 NOTE — Progress Notes (Signed)
eLink Physician-Brief Progress Note Patient Name: Debbie Barry DOB: [birth year] MRN: [id number]   Date of Service  05/10/2021  HPI/Events of Note  Patient with left upper extremity swelling.  eICU Interventions  Left upper extremity doppler ultrasound ordered to r/o DVT.        [name] Chrisandra Wiemers 05/10/2021, 1:54 AM

## 2021-05-10 NOTE — Progress Notes (Signed)
ANTICOAGULATION CONSULT NOTE - Follow Up Consult  Pharmacy Consult for Heparin Indication: DVT  No Known Allergies  Patient Measurements: Height: 5\' 1"  (154.9 cm) Weight: 58.6 kg (129 lb 3 oz) IBW/kg (Calculated) : 47.8 HEPARIN DW (KG): 61.5 kg   Vital Signs: Temp: 99.1 F (37.3 C) (11/10 1957) Temp Source: Oral (11/10 1957) BP: 123/66 (11/10 2200) Pulse Rate: 93 (11/10 2200)  Labs: Recent Labs    05/08/21 0520 05/08/21 1901 05/09/21 0635 05/09/21 1725 05/10/21 0440 05/10/21 0914 05/10/21 1800 05/10/21 2117  HGB 9.2*  --  9.6*  --  8.3*  --   --   --   HCT 25.2*  --  27.7*  --  23.6*  --   --   --   PLT 256  --  273  --  209  --   --   --   APTT  --   --   --   --  >200* 138*  --  38*  HEPARINUNFRC  --   --   --   --  >1.10*  --   --   --   CREATININE 0.47   < > 0.40* 0.45 0.36*  --  0.49  --    < > = values in this interval not displayed.     Estimated Creatinine Clearance: 56.1 mL/min (by C-G formula based on SCr of 0.49 mg/dL).   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 She is noted s/p cardiac arrest 11/7 and and EF 40-45%.  She is being started on TPN for malabsorption and also changing to IV heparin. Possible cardiac cath when more stable. Pharmacy consulted for heparin dosing.   Patient was previously on heparin at 800 units/hr and heparin level was at goal on this rate. Last dose of apixaban 10 mg given 11/9 at ~10am.   Aptt  elevated this am with heparin drip running in PICC line and labs also drawn from PICC line.   Heparin changed to PIV tonight and aptt drawn centrally.  APTT 38sec and PIV checked - difficult to flush - assume not working well Move heparin back to PIV and attempt to draw labs from peripheral stick.   Patient in no distress or having complications   Goal of Therapy:  Heparin level 0.3-0.7 units/ml aPTT 66-102 seconds Monitor platelets by anticoagulation protocol: Yes   Plan:  Increase heaprin 700 uts/hr and run through PICC  Daily aPTT, HL, CBC Monitor for signs/symptoms of bleeding   Bonnita Nasuti Pharm.D. CPP, BCPS Clinical Pharmacist [phone] 05/10/2021 10:23 PM    Please check AMION for all [place] phone numbers After 10:00 PM, call [place] [phone]

## 2021-05-10 NOTE — Progress Notes (Addendum)
APTT recollect sample sent down to lab.

## 2021-05-10 NOTE — Progress Notes (Signed)
Subjective: Patient reports improvement in breathing, states she feels she has diuresed a lot and feels less swollen. She was not able to tolerate feeding tube placement/attempted placement of postpyloric feeding tube(only advanced up to the stomach), was removed subsequently. Has been started on IV TPN. States she has marked improvement in nausea, was able to tolerate cream cheese and muffin for breakfast today along with some peaches. Denies bowel movements overnight, no bowel movements today morning. Was able to take cholestyramine yesterday.  Objective: Vital signs in last 24 hours: Temp:  [97.8 F (36.6 C)-99.1 F (37.3 C)] 99.1 F (37.3 C) (11/10 0749) Pulse Rate:  [53-150] 103 (11/10 0700) Resp:  [12-29] 14 (11/10 0700) BP: (107-154)/(60-128) 143/88 (11/10 0700) SpO2:  [90 %-100 %] 96 % (11/10 0700) FiO2 (%):  [50 %] 50 % (11/10 0400) Weight:  [58.6 kg-61.2 kg] 58.6 kg (11/10 0442) Weight change: 0 kg Last BM Date: 05/09/21  PE: On oxygen via nasal cannula, not using accessory muscles of respiration GENERAL: Prominent pallor, puffy face  ABDOMEN: Nondistended, mild right lower quadrant tenderness, normoactive bowel sounds EXTREMITIES: No edema noted  Lab Results: Results for orders placed or performed during the hospital encounter of 05/02/21 (from the past 48 hour(s))  VITAMIN D 25 Hydroxy (Vit-D Deficiency, Fractures)     Status: None   Collection Time: 05/08/21 12:18 PM  Result Value Ref Range   Vit D, 25-Hydroxy 87.74 30 - 100 ng/mL    Comment: (NOTE) Vitamin D deficiency has been defined by the Institute of Medicine  and an Endocrine Society practice guideline as a level of serum 25-OH  vitamin D less than 20 ng/mL (1,2). The Endocrine Society went on to  further define vitamin D insufficiency as a level between 21 and 29  ng/mL (2).  1. IOM (Institute of Medicine). 2010. Dietary reference intakes for  calcium and D. [place]: [facility] [employer]. 2. Holick MF, Binkley Hamden, Bischoff-Ferrari HA, et al. Evaluation,  treatment, and prevention of vitamin D deficiency: an Endocrine  Society clinical practice guideline, JCEM. 2011 Jul; 96(7): 1911-30.  Performed at [facility], [place] [address] [place], [place] [address]   Calcium, ionized     Status: None   Collection Time: 05/08/21 12:18 PM  Result Value Ref Range   Calcium, Ionized, Serum 4.8 4.5 - 5.6 mg/dL    Comment: (NOTE) Performed At: [facility] [place], [place] [id number] Rush Farmer MD AS:[id number]   C Difficile Quick Screen (NO PCR Reflex)     Status: None   Collection Time: 05/08/21  2:48 PM   Specimen: Per Rectum; Stool  Result Value Ref Range   C Diff antigen NEGATIVE NEGATIVE   C Diff toxin NEGATIVE NEGATIVE   C Diff interpretation No C. difficile detected.     Comment: Performed at [facility], [place] [address], [place], [place] [address]  Gastrointestinal Panel by PCR , Stool     Status: None   Collection Time: 05/08/21  2:48 PM   Specimen: Per Rectum; Stool  Result Value Ref Range   Campylobacter species NOT DETECTED NOT DETECTED   Plesimonas shigelloides NOT DETECTED NOT DETECTED   Salmonella species NOT DETECTED NOT DETECTED   Yersinia enterocolitica NOT DETECTED NOT DETECTED   Vibrio species NOT DETECTED NOT DETECTED   Vibrio cholerae NOT DETECTED NOT DETECTED   Enteroaggregative E coli (EAEC) NOT DETECTED NOT DETECTED   Enteropathogenic E coli (EPEC) NOT DETECTED NOT DETECTED  Enterotoxigenic E coli (ETEC) NOT DETECTED NOT DETECTED   Shiga like toxin producing E coli (STEC) NOT DETECTED NOT DETECTED   Shigella/Enteroinvasive E coli (EIEC) NOT DETECTED NOT DETECTED   Cryptosporidium NOT DETECTED NOT DETECTED   Cyclospora cayetanensis NOT DETECTED NOT DETECTED   Entamoeba histolytica NOT DETECTED NOT DETECTED   Giardia lamblia NOT DETECTED NOT DETECTED   Adenovirus F40/41 NOT DETECTED NOT DETECTED    Astrovirus NOT DETECTED NOT DETECTED   Norovirus GI/GII NOT DETECTED NOT DETECTED   Rotavirus A NOT DETECTED NOT DETECTED   Sapovirus (I, II, IV, and V) NOT DETECTED NOT DETECTED    Comment: Performed at [facility], [place] [place], [place] [address]  Vitamin B12     Status: None   Collection Time: 05/08/21  4:49 PM  Result Value Ref Range   Vitamin B-12 743 180 - 914 pg/mL    Comment: (NOTE) This assay is not validated for testing neonatal or myeloproliferative syndrome specimens for Vitamin B12 levels. Performed at [facility], [place] [address] [place], [place] [address]   Folate     Status: None   Collection Time: 05/08/21  4:49 PM  Result Value Ref Range   Folate 18.6 >5.9 ng/mL    Comment: Performed at [facility], [place] [address] [place], [place] [address]  Iron and TIBC     Status: None   Collection Time: 05/08/21  4:49 PM  Result Value Ref Range   Iron 40 28 - 170 ug/dL   TIBC NOT CALCULATED 250 - 450 ug/dL    Comment: TRANSFERRIN <70   Saturation Ratios NOT CALCULATED 10.4 - 31.8 %   UIBC NOT CALCULATED ug/dL    Comment: Performed at [facility] [address] [place], [place] [address]  Ferritin     Status: Abnormal   Collection Time: 05/08/21  4:49 PM  Result Value Ref Range   Ferritin 662 (H) 11 - 307 ng/mL    Comment: Performed at [facility], [place] [address] [place], [place] [address]  Reticulocytes     Status: Abnormal   Collection Time: 05/08/21  4:49 PM  Result Value Ref Range   Retic Ct Pct 2.3 0.4 - 3.1 %   RBC. 2.83 (L) 3.87 - 5.11 MIL/uL   Retic Count, Absolute 64.8 19.0 - 186.0 K/uL   Immature Retic Fract 14.5 2.3 - 15.9 %    Comment: Performed at [facility] [address], [place], [place] [address]  Transferrin     Status: Abnormal   Collection Time: 05/08/21  4:49 PM  Result Value Ref Range   Transferrin <70 (L) 192 - 382 mg/dL    Comment: REPEATED TO VERIFY Performed at [facility], [place] [address] [place], [place] [address]   Alpha-1-antitrypsin     Status: None   Collection Time: 05/08/21  4:49 PM  Result Value Ref Range   A-1 Antitrypsin, Ser 141 101 - 187 mg/dL    Comment: (NOTE) Performed At: [facility] [place], [place] [id number] Rush Farmer MD [id number]   Ceruloplasmin     Status: Abnormal   Collection Time: 05/08/21  4:49 PM  Result Value Ref Range   Ceruloplasmin 16.9 (L) 19.0 - 39.0 mg/dL    Comment: (NOTE) Performed At: [facility] [place], [place] [id number] Rush Farmer MD [id number]   Fibrinogen     Status: None  Collection Time: 05/08/21  4:49 PM  Result Value Ref Range   Fibrinogen 395 210 - 475 mg/dL    Comment: (NOTE) Fibrinogen results may be underestimated in patients receiving thrombolytic therapy. Performed at [facility], [place] [address], [place], [place] [address]   IgG, IgA, IgM     Status: Abnormal   Collection Time: 05/08/21  4:49 PM  Result Value Ref Range   IgG (Immunoglobin G), Serum 670 586 - 1,602 mg/dL   IgA 356 (H) 87 - 352 mg/dL   IgM (Immunoglobulin M), Srm 127 26 - 217 mg/dL    Comment: (NOTE) Performed At: [facility] [address] [place], [place] [id number] Rush Farmer MD [id number]   Copper, serum     Status: None   Collection Time: 05/08/21  4:49 PM  Result Value Ref Range   Copper 80 80 - 158 ug/dL    Comment: (NOTE) This test was developed and its performance characteristics determined by [facility]. It has not been cleared or approved by the Food and Drug Administration.                                Detection Limit = 5 Performed At: [facility] [address] [place], [place] [id number] Rush Farmer MD [id number]   .Cooxemetry Panel (carboxy, met, total hgb, O2 sat)     Status: Abnormal   Collection Time: 05/08/21  5:40 PM  Result Value Ref Range   Total hemoglobin 9.9 (L) 12.0 - 16.0 g/dL    O2 Saturation 66.7 %   Carboxyhemoglobin 0.7 0.5 - 1.5 %   Methemoglobin 0.8 0.0 - 1.5 %    Comment: Performed at [facility] [address] [place], [place] [address]  Lactic acid, plasma     Status: None   Collection Time: 05/08/21  5:45 PM  Result Value Ref Range   Lactic Acid, Venous 1.5 0.5 - 1.9 mmol/L    Comment: Performed at [facility] [address] [place] [address], [place], [place] [address]  Potassium     Status: None   Collection Time: 05/08/21  7:01 PM  Result Value Ref Range   Potassium 4.9 3.5 - 5.1 mmol/L    Comment: Performed at [facility] [address], [place], [place] [address]  Basic metabolic panel     Status: Abnormal   Collection Time: 05/08/21  7:01 PM  Result Value Ref Range   Sodium 133 (L) 135 - 145 mmol/L   Potassium 4.8 3.5 - 5.1 mmol/L   Chloride 99 98 - 111 mmol/L   CO2 27 22 - 32 mmol/L   Glucose, Bld 157 (H) 70 - 99 mg/dL    Comment: Glucose reference range applies only to samples taken after fasting for at least 8 hours.   BUN <5 (L) 8 - 23 mg/dL   Creatinine, Ser 0.48 0.44 - 1.00 mg/dL   Calcium 7.7 (L) 8.9 - 10.3 mg/dL   GFR, Estimated >60 >60 mL/min    Comment: (NOTE) Calculated using the CKD-EPI Creatinine Equation (2021)    Anion gap 7 5 - 15    Comment: Performed at [facility] [address] [place], [place] [address]  Basic metabolic panel     Status: Abnormal   Collection Time: 05/09/21 12:33 AM  Result Value Ref Range   Sodium 133 (L) 135 - 145 mmol/L   Potassium 4.9 3.5 -  5.1 mmol/L   Chloride 101 98 - 111 mmol/L   CO2 28 22 - 32 mmol/L   Glucose, Bld 132 (H) 70 - 99 mg/dL    Comment: Glucose reference range applies only to samples taken after fasting for at least 8 hours.   BUN <5 (L) 8 - 23 mg/dL   Creatinine, Ser 0.50 0.44 - 1.00 mg/dL   Calcium 7.5 (L) 8.9 - 10.3 mg/dL   GFR, Estimated >60 >60 mL/min    Comment: (NOTE) Calculated using the CKD-EPI Creatinine Equation (2021)    Anion gap 4 (L) 5 -  15    Comment: Performed at [facility] [address], [place], [place] [address]  CBC     Status: Abnormal   Collection Time: 05/09/21  6:35 AM  Result Value Ref Range   WBC 8.4 4.0 - 10.5 K/uL   RBC 2.90 (L) 3.87 - 5.11 MIL/uL   Hemoglobin 9.6 (L) 12.0 - 15.0 g/dL   HCT 27.7 (L) 36.0 - 46.0 %   MCV 95.5 80.0 - 100.0 fL   MCH 33.1 26.0 - 34.0 pg   MCHC 34.7 30.0 - 36.0 g/dL   RDW 16.0 (H) 11.5 - 15.5 %   Platelets 273 150 - 400 K/uL   nRBC 0.0 0.0 - 0.2 %    Comment: Performed at [facility], [place] [address], [place], [place] [address]  Basic metabolic panel     Status: Abnormal   Collection Time: 05/09/21  6:35 AM  Result Value Ref Range   Sodium 133 (L) 135 - 145 mmol/L   Potassium 4.6 3.5 - 5.1 mmol/L   Chloride 99 98 - 111 mmol/L   CO2 26 22 - 32 mmol/L   Glucose, Bld 124 (H) 70 - 99 mg/dL    Comment: Glucose reference range applies only to samples taken after fasting for at least 8 hours.   BUN 5 (L) 8 - 23 mg/dL   Creatinine, Ser 0.40 (L) 0.44 - 1.00 mg/dL   Calcium 7.8 (L) 8.9 - 10.3 mg/dL   GFR, Estimated >60 >60 mL/min    Comment: (NOTE) Calculated using the CKD-EPI Creatinine Equation (2021)    Anion gap 8 5 - 15    Comment: Performed at [facility] [address], [place], [place] [address]  Magnesium     Status: None   Collection Time: 05/09/21  6:35 AM  Result Value Ref Range   Magnesium 1.9 1.7 - 2.4 mg/dL    Comment: Performed at [facility] [address] [place], [place] [address]  Phosphorus     Status: None   Collection Time: 05/09/21  6:35 AM  Result Value Ref Range   Phosphorus 3.9 2.5 - 4.6 mg/dL    Comment: Performed at [facility] [address], [place], [place] [address]  Culture, blood (Routine X 2) w Reflex to ID Panel     Status: None (Preliminary result)   Collection Time: 05/09/21 10:53 AM   Specimen: BLOOD LEFT ARM  Result Value Ref Range   Specimen Description BLOOD LEFT ARM    Special Requests       BOTTLES DRAWN AEROBIC ONLY Blood Culture results may not be optimal due to an inadequate volume of blood received in culture bottles   Culture      NO GROWTH < 24 HOURS Performed at [facility] [address], [place], [place] [address]    Report  Status PENDING   TSH     Status: None   Collection Time: 05/09/21 11:00 AM  Result Value Ref Range   TSH 4.440 0.350 - 4.500 uIU/mL    Comment: Performed by a 3rd Generation assay with a functional sensitivity of <=0.01 uIU/mL. Performed at [facility], [place] [address], [place], [place] [address]   Glucose, capillary     Status: Abnormal   Collection Time: 05/09/21  1:32 PM  Result Value Ref Range   Glucose-Capillary 111 (H) 70 - 99 mg/dL    Comment: Glucose reference range applies only to samples taken after fasting for at least 8 hours.  Basic metabolic panel     Status: Abnormal   Collection Time: 05/09/21  5:25 PM  Result Value Ref Range   Sodium 135 135 - 145 mmol/L   Potassium 3.6 3.5 - 5.1 mmol/L   Chloride 100 98 - 111 mmol/L   CO2 28 22 - 32 mmol/L   Glucose, Bld 118 (H) 70 - 99 mg/dL    Comment: Glucose reference range applies only to samples taken after fasting for at least 8 hours.   BUN <5 (L) 8 - 23 mg/dL   Creatinine, Ser 0.45 0.44 - 1.00 mg/dL   Calcium 8.1 (L) 8.9 - 10.3 mg/dL   GFR, Estimated >60 >60 mL/min    Comment: (NOTE) Calculated using the CKD-EPI Creatinine Equation (2021)    Anion gap 7 5 - 15    Comment: Performed at [facility] [address], [place], [place] [address]  Magnesium     Status: None   Collection Time: 05/09/21  5:25 PM  Result Value Ref Range   Magnesium 2.1 1.7 - 2.4 mg/dL    Comment: Performed at [facility], [place] [address], [place], [place] [address]  Glucose, capillary     Status: Abnormal   Collection Time: 05/09/21  6:12 PM  Result Value Ref Range   Glucose-Capillary 100 (H) 70 - 99 mg/dL    Comment: Glucose reference range applies only to samples  taken after fasting for at least 8 hours.  Glucose, capillary     Status: Abnormal   Collection Time: 05/10/21 12:40 AM  Result Value Ref Range   Glucose-Capillary 101 (H) 70 - 99 mg/dL    Comment: Glucose reference range applies only to samples taken after fasting for at least 8 hours.  CBC     Status: Abnormal   Collection Time: 05/10/21  4:40 AM  Result Value Ref Range   WBC 6.3 4.0 - 10.5 K/uL   RBC 2.50 (L) 3.87 - 5.11 MIL/uL   Hemoglobin 8.3 (L) 12.0 - 15.0 g/dL   HCT 23.6 (L) 36.0 - 46.0 %   MCV 94.4 80.0 - 100.0 fL   MCH 33.2 26.0 - 34.0 pg   MCHC 35.2 30.0 - 36.0 g/dL   RDW 16.0 (H) 11.5 - 15.5 %   Platelets 209 150 - 400 K/uL   nRBC 0.0 0.0 - 0.2 %    Comment: Performed at [facility] [address], [place], [place] [address]  Magnesium     Status: Abnormal   Collection Time: 05/10/21  4:40 AM  Result Value Ref Range   Magnesium 1.6 (L) 1.7 - 2.4 mg/dL    Comment: Performed at [facility] [address] [place], [place] [address]  Comprehensive metabolic panel     Status: Abnormal   Collection Time: 05/10/21  4:40  AM  Result Value Ref Range   Sodium 134 (L) 135 - 145 mmol/L   Potassium 3.1 (L) 3.5 - 5.1 mmol/L   Chloride 96 (L) 98 - 111 mmol/L   CO2 30 22 - 32 mmol/L   Glucose, Bld 103 (H) 70 - 99 mg/dL    Comment: Glucose reference range applies only to samples taken after fasting for at least 8 hours.   BUN <5 (L) 8 - 23 mg/dL   Creatinine, Ser 0.36 (L) 0.44 - 1.00 mg/dL   Calcium 7.9 (L) 8.9 - 10.3 mg/dL   Total Protein 4.3 (L) 6.5 - 8.1 g/dL   Albumin 1.9 (L) 3.5 - 5.0 g/dL   AST 22 15 - 41 U/L   ALT 14 0 - 44 U/L   Alkaline Phosphatase 92 38 - 126 U/L   Total Bilirubin 0.6 0.3 - 1.2 mg/dL   GFR, Estimated >60 >60 mL/min    Comment: (NOTE) Calculated using the CKD-EPI Creatinine Equation (2021)    Anion gap 8 5 - 15    Comment: Performed at [facility], [place] [address], [place], [place] [address]  Phosphorus     Status: None    Collection Time: 05/10/21  4:40 AM  Result Value Ref Range   Phosphorus 3.6 2.5 - 4.6 mg/dL    Comment: Performed at [facility] [address], [place], [place] [address]  Heparin level (unfractionated)     Status: Abnormal   Collection Time: 05/10/21  4:40 AM  Result Value Ref Range   Heparin Unfractionated >1.10 (H) 0.30 - 0.70 IU/mL    Comment: (NOTE) The clinical reportable range upper limit is being lowered to >1.10 to align with the FDA approved guidance for the current laboratory assay.  If heparin results are below expected values, and patient dosage has  been confirmed, suggest follow up testing of antithrombin III levels. Performed at [facility], [place] [address] [place], [place] [address]   Triglycerides     Status: None   Collection Time: 05/10/21  4:40 AM  Result Value Ref Range   Triglycerides 61 <150 mg/dL    Comment: Performed at [facility] [address] [place], [place] [address]  APTT     Status: Abnormal   Collection Time: 05/10/21  4:40 AM  Result Value Ref Range   aPTT >200 (HH) 24 - 36 seconds    Comment:        IF BASELINE aPTT IS ELEVATED, SUGGEST PATIENT RISK ASSESSMENT BE USED TO DETERMINE APPROPRIATE ANTICOAGULANT THERAPY. REPEATED TO VERIFY CRITICAL RESULT CALLED TO, READ BACK BY AND VERIFIED WITH: S.JAMES,RN 0813 05/10/21 CLARK,S Performed at [facility], [address] [place], [place] [address]   Glucose, capillary     Status: None   Collection Time: 05/10/21  6:36 AM  Result Value Ref Range   Glucose-Capillary 95 70 - 99 mg/dL    Comment: Glucose reference range applies only to samples taken after fasting for at least 8 hours.    Studies/Results: DG CHEST PORT 1 VIEW  Result Date: 05/08/2021 CLINICAL DATA:  Of breath EXAM: PORTABLE CHEST 1 VIEW COMPARISON:  Portable exam 1654 hours compared to 05/07/2021 FINDINGS: RIGHT arm PICC line with tip projecting over cavoatrial junction. Enlargement of cardiac  silhouette with pulmonary vascular congestion. Diffuse pulmonary infiltrate consistent with pulmonary edema. Layering pleural effusions, better appreciated on preceding lateral decubitus radiographs. BILATERAL lower lobe atelectasis versus consolidation without pneumothorax. Questioned  nodular density on prior exam not visualized. Osseous demineralization. IMPRESSION: Enlargement of cardiac silhouette with pulmonary vascular congestion and questionable perihilar edema. Layering BILATERAL pleural effusions. Additional atelectasis versus consolidation of lower lobes. Electronically Signed   By: Lavonia Dana M.D.   On: 05/08/2021 17:10   ECHOCARDIOGRAM LIMITED  Result Date: 05/09/2021    ECHOCARDIOGRAM LIMITED REPORT   Patient Name:   Debbie Barry Date of Exam: 05/09/2021 Medical Rec #:  [id number]          Height:       61.0 in Accession #:    [id number]         Weight:       134.9 lb Date of Birth:  [birth year]         BSA:          1.598 m Patient Age:    89 years           BP:           135/93 mmHg Patient Gender: F                  HR:           117 bpm. Exam Location:  Inpatient Procedure: 2D Echo Indications:    acute systolic chf  History:        Patient has prior history of Echocardiogram examinations, most                 recent 05/07/2021.  Sonographer:    Johny Chess RDCS Referring Phys: [id number] [place]  1. Left ventricular ejection fraction, by estimation, is 25 to 30%. The left ventricle has severely decreased function. The left ventricle demonstrates global hypokinesis. Left ventricular diastolic parameters are consistent with Grade I diastolic dysfunction (impaired relaxation).  2. Right ventricular systolic function is moderately reduced. The right ventricular size is mildly enlarged. Tricuspid regurgitation signal is inadequate for assessing PA pressure.  3. The mitral valve is normal in structure. Mild mitral valve regurgitation. No evidence of mitral stenosis.   4. The aortic valve is tricuspid. Aortic valve regurgitation is not visualized. Mild aortic valve sclerosis is present, with no evidence of aortic valve stenosis.  5. The inferior vena cava is dilated in size with <50% respiratory variability, suggesting right atrial pressure of 15 mmHg.  6. There is a left pleural effusion.  7. Limited echo, biventricular dysfunction. FINDINGS  Left Ventricle: Left ventricular ejection fraction, by estimation, is 25 to 30%. The left ventricle has severely decreased function. The left ventricle demonstrates global hypokinesis. The left ventricular internal cavity size was normal in size. There is no left ventricular hypertrophy. Left ventricular diastolic parameters are consistent with Grade I diastolic dysfunction (impaired relaxation). Right Ventricle: The right ventricular size is mildly enlarged. Right ventricular systolic function is moderately reduced. Tricuspid regurgitation signal is inadequate for assessing PA pressure. Pericardium: There is a left pleural effusion. There is no evidence of pericardial effusion. Mitral Valve: The mitral valve is normal in structure. Mild mitral annular calcification. Mild mitral valve regurgitation. No evidence of mitral valve stenosis. Tricuspid Valve: The tricuspid valve is normal in structure. Tricuspid valve regurgitation is not demonstrated. Aortic Valve: The aortic valve is tricuspid. Aortic valve regurgitation is not visualized. Mild aortic valve sclerosis is present, with no evidence of aortic valve stenosis. Venous: The inferior vena cava is dilated in size with less than 50% respiratory variability, suggesting right atrial pressure of 15 mmHg. LEFT VENTRICLE PLAX 2D  LVIDd:         5.00 cm     Diastology LVIDs:         4.00 cm     LV e' lateral: 7.83 cm/s LV PW:         0.80 cm LV IVS:        0.60 cm  LV Volumes (MOD) LV vol d, MOD A4C: 71.2 ml LV vol s, MOD A4C: 46.4 ml LV SV MOD A4C:     71.2 ml IVC IVC diam: 2.30 cm LEFT ATRIUM          Index LA diam:    3.40 cm 2.13 cm/m  AORTIC VALVE LVOT Vmax:   86.80 cm/s LVOT Vmean:  63.000 cm/s LVOT VTI:    0.122 m  AORTA Ao Asc diam: 2.90 cm  SHUNTS Systemic VTI: 0.12 m [place] Electronically signed by Franki Monte Signature Date/Time: 05/09/2021/6:52:14 PM    Final     Medications: I have reviewed the patient's current medications.  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 Discussed about IR guided feeding tube placement in the future if patient unable to meet nutritional needs orally Continue cholestyramine 4 g twice a day for diarrhea(noninfectious: Negative C. difficile and GI pathogen panel), if needed can be increased to 4 times a day. Continue sucralfate 4 times a day Guarded prognosis, will need prolonged nutritional support with low-fat, high-protein, high medium change triglycerides containing diet to improve low albumin.   Ronnette Juniper, MD 05/10/2021, 9:05 AM

## 2021-05-10 NOTE — Progress Notes (Signed)
Physical Therapy Treatment Patient Details Name: Debbie Barry MRN: [id number] DOB: 1951/03/29 Today's Date: 05/10/2021   History of Present Illness Pt adm 11/2 with chest pain and found to have lt common iliac DVT. Pt also found to have acute rt renal infarct, acute on chronic diastolic heart failure and large bilateral pleural effusions. On 11/7 pt suffered a vfib/vtach arrest and received CPR and shock.  PMH - spinal stenosis, chronic back pain, obesity, chf, htn, pvd, pt with recent hospital admission with BLE weakness and found to have chronic L3-4 compression fx and severe L4-5 canal stenosis.    PT Comments    The pt was able to make great progress with mobility this session despite fatigue and need for repeated rest breaks. She was able to tolerate sit-stand transfers x2, but needs modA of 2 through HHA to power up and to steady in standing. The pt was able to take small pivotal steps to the recliner, but again needed at least modA of 2, increased time, and max encouragement. Despite improvements, the pt continues to require more assist than she feels her spouse is able to safely provide, but continues to decline return to SNF. Will continue to benefit from maximal mobility and acute therapies to achieve pt goal of return home given poor functional strength, power, stability, or activity tolerance at this time.    Recommendations for follow up therapy are one component of a multi-disciplinary discharge planning process, led by the attending physician.  Recommendations may be updated based on patient status, additional functional criteria and insurance authorization.  Follow Up Recommendations  Home health PT (pt remains adamant she does not want to return to SNF, but agrees she currently needs more assist than her spouse can safely provide)     Assistance Recommended at Discharge Frequent or constant Supervision/Assistance  Equipment Recommendations   (lift, pt reports WC has  already been delivered to home)    Recommendations for Other Services       Precautions / Restrictions Precautions Precautions: Fall;Other (comment) Precaution Comments: monitor HR Required Braces or Orthoses: Spinal Brace Spinal Brace: Lumbar corset Other Brace: Pt had a brace from last admission but not present in the room. Stated she used some when up. Restrictions Weight Bearing Restrictions: No     Mobility  Bed Mobility Overal bed mobility: Needs Assistance Bed Mobility: Rolling;Sidelying to Sit Rolling: Min assist Sidelying to sit: Mod assist;+2 for safety/equipment       General bed mobility comments: minA to initiate movements, modA to complete with trunk. pt with very crouched posture and trunk flexion    Transfers Overall transfer level: Needs assistance Equipment used: 2 person hand held assist Transfers: Sit to/from Stand;Bed to chair/wheelchair/BSC Sit to Stand: Mod assist;+2 physical assistance     Step pivot transfers: Mod assist;+2 physical assistance     General transfer comment: pt with HR elevation to 135 initially and 142bpm on second attempt. modA of 2 to power up and steady while taking small steps to recliner    Ambulation/Gait               General Gait Details: pt limited to pivoting steps at this time, HR to 142 bpm      Balance Overall balance assessment: Needs assistance Sitting-balance support: No upper extremity supported;Feet unsupported Sitting balance-Leahy Scale: Fair Sitting balance - Comments: pt fatigues quickly, sitting with trunk flexion   Standing balance support: Bilateral upper extremity supported;During functional activity Standing balance-Leahy Scale: Poor Standing balance  comment: dependent on BUE support through [facility]                            Cognition Arousal/Alertness: Awake/alert Behavior During Therapy: WFL for tasks assessed/performed Overall Cognitive Status: Within Functional Limits for  tasks assessed                                 General Comments: pt benefits from encouragement, able to follow cues with increased time.        Exercises      General Comments General comments (skin integrity, edema, etc.): HR elevated to 135 with initial sit-stand adn 142bpm with transfer to recliner. pt on 15L HFNC on wall, asked O2 to be turned down to 10L from tank. SpO2 remained at 100%      Pertinent Vitals/Pain Pain Assessment: No/denies pain Pain Intervention(s): Monitored during session     PT Goals (current goals can now be found in the care plan section) Acute Rehab PT Goals Patient Stated Goal: go home PT Goal Formulation: With patient Time For Goal Achievement: 05/18/21 Potential to Achieve Goals: Fair Progress towards PT goals: Progressing toward goals    Frequency    Min 3X/week      PT Plan Current plan remains appropriate;Frequency needs to be updated (if family able to provide assist for transfers and all mobility)       AM-PAC PT "6 Clicks" Mobility   Outcome Measure  Help needed turning from your back to your side while in a flat bed without using bedrails?: A Little Help needed moving from lying on your back to sitting on the side of a flat bed without using bedrails?: A Lot Help needed moving to and from a bed to a chair (including a wheelchair)?: Total Help needed standing up from a chair using your arms (e.g., wheelchair or bedside chair)?: Total Help needed to walk in hospital room?: Total Help needed climbing 3-5 steps with a railing? : Total 6 Click Score: 9    End of Session Equipment Utilized During Treatment: Gait belt;Oxygen Activity Tolerance: Patient limited by fatigue Patient left: with call bell/phone within reach;in chair Nurse Communication: Mobility status PT Visit Diagnosis: Difficulty in walking, not elsewhere classified (R26.2);Muscle weakness (generalized) (M62.81);History of falling (Z91.81)     Time:  6256-3893 PT Time Calculation (min) (ACUTE ONLY): 28 min  Charges:  $Therapeutic Activity: 23-37 mins                     West Carbo, PT, DPT   Acute Rehabilitation Department Pager #: [phone]   Sandra Cockayne 05/10/2021, 3:47 PM

## 2021-05-11 ENCOUNTER — Other Ambulatory Visit (HOSPITAL_COMMUNITY): Payer: Self-pay

## 2021-05-11 ENCOUNTER — Encounter (HOSPITAL_COMMUNITY): Payer: Self-pay | Admitting: Cardiovascular Disease

## 2021-05-11 ENCOUNTER — Encounter (HOSPITAL_COMMUNITY)
Admission: EM | Disposition: A | Discharge status: Home Health | Payer: Self-pay | Source: Skilled Nursing Facility | Attending: Student

## 2021-05-11 DIAGNOSIS — I429 Cardiomyopathy, unspecified: Secondary | ICD-10-CM | POA: Diagnosis not present

## 2021-05-11 DIAGNOSIS — I428 Other cardiomyopathies: Secondary | ICD-10-CM

## 2021-05-11 DIAGNOSIS — I469 Cardiac arrest, cause unspecified: Secondary | ICD-10-CM | POA: Diagnosis not present

## 2021-05-11 DIAGNOSIS — I4721 Torsades de pointes: Secondary | ICD-10-CM

## 2021-05-11 DIAGNOSIS — I5021 Acute systolic (congestive) heart failure: Secondary | ICD-10-CM

## 2021-05-11 DIAGNOSIS — R601 Generalized edema: Secondary | ICD-10-CM | POA: Diagnosis not present

## 2021-05-11 DIAGNOSIS — J90 Pleural effusion, not elsewhere classified: Secondary | ICD-10-CM | POA: Diagnosis not present

## 2021-05-11 DIAGNOSIS — I824Y2 Acute embolism and thrombosis of unspecified deep veins of left proximal lower extremity: Secondary | ICD-10-CM | POA: Diagnosis not present

## 2021-05-11 HISTORY — PX: RIGHT/LEFT HEART CATH AND CORONARY ANGIOGRAPHY: CATH118266

## 2021-05-11 LAB — POCT I-STAT EG7
Acid-Base Excess: 13 mmol/L — ABNORMAL HIGH (ref 0.0–2.0)
Acid-Base Excess: 13 mmol/L — ABNORMAL HIGH (ref 0.0–2.0)
Bicarbonate: 38.3 mmol/L — ABNORMAL HIGH (ref 20.0–28.0)
Bicarbonate: 38.7 mmol/L — ABNORMAL HIGH (ref 20.0–28.0)
Calcium, Ion: 1.14 mmol/L — ABNORMAL LOW (ref 1.15–1.40)
Calcium, Ion: 1.14 mmol/L — ABNORMAL LOW (ref 1.15–1.40)
HCT: 29 % — ABNORMAL LOW (ref 36.0–46.0)
HCT: 29 % — ABNORMAL LOW (ref 36.0–46.0)
Hemoglobin: 9.9 g/dL — ABNORMAL LOW (ref 12.0–15.0)
Hemoglobin: 9.9 g/dL — ABNORMAL LOW (ref 12.0–15.0)
O2 Saturation: 55 %
O2 Saturation: 58 %
Potassium: 3.2 mmol/L — ABNORMAL LOW (ref 3.5–5.1)
Potassium: 3.3 mmol/L — ABNORMAL LOW (ref 3.5–5.1)
Sodium: 135 mmol/L (ref 135–145)
Sodium: 135 mmol/L (ref 135–145)
TCO2: 40 mmol/L — ABNORMAL HIGH (ref 22–32)
TCO2: 40 mmol/L — ABNORMAL HIGH (ref 22–32)
pCO2, Ven: 53.3 mmHg (ref 44.0–60.0)
pCO2, Ven: 53.9 mmHg (ref 44.0–60.0)
pH, Ven: 7.459 — ABNORMAL HIGH (ref 7.250–7.430)
pH, Ven: 7.47 — ABNORMAL HIGH (ref 7.250–7.430)
pO2, Ven: 28 mmHg — CL (ref 32.0–45.0)
pO2, Ven: 29 mmHg — CL (ref 32.0–45.0)

## 2021-05-11 LAB — BASIC METABOLIC PANEL
Anion gap: 5 (ref 5–15)
Anion gap: 5 (ref 5–15)
BUN: 5 mg/dL — ABNORMAL LOW (ref 8–23)
BUN: 6 mg/dL — ABNORMAL LOW (ref 8–23)
CO2: 36 mmol/L — ABNORMAL HIGH (ref 22–32)
CO2: 35 mmol/L — ABNORMAL HIGH (ref 22–32)
Calcium: 7.6 mg/dL — ABNORMAL LOW (ref 8.9–10.3)
Calcium: 7.6 mg/dL — ABNORMAL LOW (ref 8.9–10.3)
Chloride: 96 mmol/L — ABNORMAL LOW (ref 98–111)
Chloride: 94 mmol/L — ABNORMAL LOW (ref 98–111)
Creatinine, Ser: 0.44 mg/dL (ref 0.44–1.00)
Creatinine, Ser: 0.46 mg/dL (ref 0.44–1.00)
GFR, Estimated: >60 mL/min (ref >60)
GFR, Estimated: >60 mL/min (ref >60)
Glucose, Bld: 106 mg/dL — ABNORMAL HIGH (ref 70–99)
Glucose, Bld: 134 mg/dL — ABNORMAL HIGH (ref 70–99)
Potassium: 3.6 mmol/L (ref 3.5–5.1)
Potassium: 3.2 mmol/L — ABNORMAL LOW (ref 3.5–5.1)
Sodium: 137 mmol/L (ref 135–145)
Sodium: 134 mmol/L — ABNORMAL LOW (ref 135–145)

## 2021-05-11 LAB — CBC
HCT: 24.6 % — ABNORMAL LOW (ref 36.0–46.0)
Hemoglobin: 8.9 g/dL — ABNORMAL LOW (ref 12.0–15.0)
MCH: 33.8 pg (ref 26.0–34.0)
MCHC: 36.2 g/dL — ABNORMAL HIGH (ref 30.0–36.0)
MCV: 93.5 fL (ref 80.0–100.0)
Platelets: 224 10*3/uL (ref 150–400)
RBC: 2.63 MIL/uL — ABNORMAL LOW (ref 3.87–5.11)
RDW: 15.9 % — ABNORMAL HIGH (ref 11.5–15.5)
WBC: 7 10*3/uL (ref 4.0–10.5)
nRBC: 0 % (ref 0.0–0.2)

## 2021-05-11 LAB — PHOSPHORUS: Phosphorus: 2.7 mg/dL (ref 2.5–4.6)

## 2021-05-11 LAB — HEPARIN LEVEL (UNFRACTIONATED): Heparin Unfractionated: >1.1 IU/mL — ABNORMAL HIGH (ref 0.30–0.70)

## 2021-05-11 LAB — POCT I-STAT 7, (LYTES, BLD GAS, ICA,H+H)
Acid-Base Excess: 12 mmol/L — ABNORMAL HIGH (ref 0.0–2.0)
Bicarbonate: 36.3 mmol/L — ABNORMAL HIGH (ref 20.0–28.0)
Calcium, Ion: 1.13 mmol/L — ABNORMAL LOW (ref 1.15–1.40)
HCT: 28 % — ABNORMAL LOW (ref 36.0–46.0)
Hemoglobin: 9.5 g/dL — ABNORMAL LOW (ref 12.0–15.0)
O2 Saturation: 89 %
Potassium: 3.2 mmol/L — ABNORMAL LOW (ref 3.5–5.1)
Sodium: 135 mmol/L (ref 135–145)
TCO2: 38 mmol/L — ABNORMAL HIGH (ref 22–32)
pCO2 arterial: 45.6 mmHg (ref 32.0–48.0)
pH, Arterial: 7.509 — ABNORMAL HIGH (ref 7.350–7.450)
pO2, Arterial: 51 mmHg — ABNORMAL LOW (ref 83.0–108.0)

## 2021-05-11 LAB — MAGNESIUM: Magnesium: 1.8 mg/dL (ref 1.7–2.4)

## 2021-05-11 LAB — FECAL FAT, QUALITATIVE
Fat Qual Neutral, Stl: NORMAL
Fat Qual Total, Stl: NORMAL

## 2021-05-11 LAB — GLUCOSE, CAPILLARY
Glucose-Capillary: 107 mg/dL — ABNORMAL HIGH (ref 70–99)
Glucose-Capillary: 125 mg/dL — ABNORMAL HIGH (ref 70–99)
Glucose-Capillary: 133 mg/dL — ABNORMAL HIGH (ref 70–99)
Glucose-Capillary: 135 mg/dL — ABNORMAL HIGH (ref 70–99)

## 2021-05-11 LAB — APTT: aPTT: 70 seconds — ABNORMAL HIGH (ref 24–36)

## 2021-05-11 LAB — POCT ACTIVATED CLOTTING TIME: Activated Clotting Time: 150 seconds

## 2021-05-11 LAB — ZINC: Zinc: 46 ug/dL (ref 44–115)

## 2021-05-11 LAB — VITAMIN C: Vitamin C: 0.9 mg/dL (ref 0.4–2.0)

## 2021-05-11 SURGERY — RIGHT/LEFT HEART CATH AND CORONARY ANGIOGRAPHY
Anesthesia: LOCAL

## 2021-05-11 MED ORDER — LIDOCAINE HCL (PF) 1 % IJ SOLN
INTRAMUSCULAR | Status: AC
  Filled 2021-05-11: qty 30

## 2021-05-11 MED ORDER — ASPIRIN 81 MG PO CHEW
81.0000 mg | CHEWABLE_TABLET | ORAL | Status: AC
  Administered 2021-05-11: 81 mg via ORAL
  Filled 2021-05-11: qty 1

## 2021-05-11 MED ORDER — SODIUM CHLORIDE 0.9 % IV SOLN: 250.0000 mL | INTRAVENOUS | Status: DC | PRN

## 2021-05-11 MED ORDER — SODIUM CHLORIDE 0.9% FLUSH: 3.0000 mL | INTRAVENOUS | Status: DC | PRN

## 2021-05-11 MED ORDER — POTASSIUM CHLORIDE CRYS ER 20 MEQ PO TBCR
40.0000 meq | EXTENDED_RELEASE_TABLET | Freq: Two times a day (BID) | ORAL | Status: DC
  Administered 2021-05-11 – 2021-05-14 (×7): 40 meq via ORAL
  Filled 2021-05-11 (×7): qty 2

## 2021-05-11 MED ORDER — K PHOS MONO-SOD PHOS DI & MONO 155-852-130 MG PO TABS
500.0000 mg | ORAL_TABLET | ORAL | Status: AC
  Administered 2021-05-11 (×3): 500 mg via ORAL
  Filled 2021-05-11 (×3): qty 2

## 2021-05-11 MED ORDER — SODIUM CHLORIDE 0.9% FLUSH
3.0000 mL | Freq: Two times a day (BID) | INTRAVENOUS | Status: DC
  Administered 2021-05-11 – 2021-05-22 (×11): 3 mL via INTRAVENOUS

## 2021-05-11 MED ORDER — HEPARIN SODIUM (PORCINE) 5000 UNIT/ML IJ SOLN: 5000.0000 [IU] | Freq: Three times a day (TID) | INTRAMUSCULAR | Status: DC

## 2021-05-11 MED ORDER — FENTANYL CITRATE (PF) 100 MCG/2ML IJ SOLN
INTRAMUSCULAR | Status: AC
  Filled 2021-05-11: qty 2

## 2021-05-11 MED ORDER — TRACE MINERALS CU-MN-SE-ZN 300-55-60-3000 MCG/ML IV SOLN
INTRAVENOUS | Status: AC
  Filled 2021-05-11: qty 728

## 2021-05-11 MED ORDER — HEPARIN (PORCINE) 25000 UT/250ML-% IV SOLN: 750.0000 [IU]/h | INTRAVENOUS | Status: DC

## 2021-05-11 MED ORDER — POTASSIUM CHLORIDE 10 MEQ/50ML IV SOLN
10.0000 meq | INTRAVENOUS | Status: AC
  Administered 2021-05-11 (×2): 10 meq via INTRAVENOUS
  Filled 2021-05-11 (×2): qty 50

## 2021-05-11 MED ORDER — MIDAZOLAM HCL 2 MG/2ML IJ SOLN
INTRAMUSCULAR | Status: DC | PRN
  Administered 2021-05-11: 1 mg via INTRAVENOUS

## 2021-05-11 MED ORDER — CHOLESTYRAMINE LIGHT 4 G PO PACK
2.0000 g | PACK | Freq: Every day | ORAL | Status: DC
  Filled 2021-05-11: qty 1

## 2021-05-11 MED ORDER — MIDAZOLAM HCL 2 MG/2ML IJ SOLN
INTRAMUSCULAR | Status: AC
  Filled 2021-05-11: qty 2

## 2021-05-11 MED ORDER — SODIUM CHLORIDE 0.9 % IV SOLN: INTRAVENOUS | Status: AC

## 2021-05-11 MED ORDER — ACETAZOLAMIDE 250 MG PO TABS
500.0000 mg | ORAL_TABLET | Freq: Once | ORAL | Status: AC
  Administered 2021-05-11: 500 mg via ORAL
  Filled 2021-05-11: qty 2

## 2021-05-11 MED ORDER — CARVEDILOL 3.125 MG PO TABS
3.1250 mg | ORAL_TABLET | Freq: Two times a day (BID) | ORAL | Status: DC
  Administered 2021-05-11 (×2): 3.125 mg via ORAL
  Filled 2021-05-11 (×2): qty 1

## 2021-05-11 MED ORDER — HYDROCORTISONE 1 % EX CREA
TOPICAL_CREAM | Freq: Three times a day (TID) | CUTANEOUS | Status: AC | PRN
  Filled 2021-05-11: qty 28

## 2021-05-11 MED ORDER — MAGNESIUM SULFATE 2 GM/50ML IV SOLN
2.0000 g | Freq: Once | INTRAVENOUS | Status: AC
  Administered 2021-05-11: 2 g via INTRAVENOUS
  Filled 2021-05-11: qty 50

## 2021-05-11 MED ORDER — HYDRALAZINE HCL 20 MG/ML IJ SOLN: 10.0000 mg | INTRAMUSCULAR | Status: AC | PRN

## 2021-05-11 MED ORDER — HEPARIN (PORCINE) IN NACL 1000-0.9 UT/500ML-% IV SOLN
INTRAVENOUS | Status: AC
  Filled 2021-05-11: qty 1000

## 2021-05-11 MED ORDER — HYDROXYZINE HCL 25 MG PO TABS
25.0000 mg | ORAL_TABLET | Freq: Once | ORAL | Status: AC
  Administered 2021-05-11: 25 mg via ORAL
  Filled 2021-05-11: qty 1

## 2021-05-11 MED ORDER — ACETAMINOPHEN 325 MG PO TABS
650.0000 mg | ORAL_TABLET | ORAL | Status: DC | PRN
  Administered 2021-05-11 – 2021-05-14 (×7): 650 mg via ORAL
  Filled 2021-05-11 (×8): qty 2

## 2021-05-11 MED ORDER — SODIUM CHLORIDE 0.9 % IV SOLN: INTRAVENOUS | Status: DC

## 2021-05-11 MED ORDER — HEPARIN (PORCINE) IN NACL 1000-0.9 UT/500ML-% IV SOLN
INTRAVENOUS | Status: DC | PRN
  Administered 2021-05-11 (×2): 500 mL

## 2021-05-11 MED ORDER — LABETALOL HCL 5 MG/ML IV SOLN: 10.0000 mg | INTRAVENOUS | Status: AC | PRN

## 2021-05-11 MED ORDER — HEPARIN (PORCINE) 25000 UT/250ML-% IV SOLN
700.0000 [IU]/h | INTRAVENOUS | Status: DC
  Administered 2021-05-11 – 2021-05-13 (×2): 750 [IU]/h via INTRAVENOUS
  Administered 2021-05-14: 700 [IU]/h via INTRAVENOUS
  Filled 2021-05-11 (×2): qty 250

## 2021-05-11 MED ORDER — IOHEXOL 350 MG/ML SOLN
INTRAVENOUS | Status: DC | PRN
  Administered 2021-05-11: 35 mL

## 2021-05-11 MED ORDER — SUCRALFATE 1 GM/10ML PO SUSP
1.0000 g | Freq: Two times a day (BID) | ORAL | Status: DC
  Administered 2021-05-11 – 2021-05-22 (×22): 1 g via ORAL
  Filled 2021-05-11 (×22): qty 10

## 2021-05-11 MED ORDER — SODIUM CHLORIDE 0.9% FLUSH
3.0000 mL | Freq: Two times a day (BID) | INTRAVENOUS | Status: DC
  Administered 2021-05-11: 3 mL via INTRAVENOUS

## 2021-05-11 MED ORDER — FENTANYL CITRATE (PF) 100 MCG/2ML IJ SOLN
INTRAMUSCULAR | Status: DC | PRN
  Administered 2021-05-11: 25 ug via INTRAVENOUS

## 2021-05-11 SURGICAL SUPPLY — 14 items
CATH INFINITI 5FR MULTPACK ANG (CATHETERS) ×2 IMPLANT
CATH SWAN GANZ 7F STRAIGHT (CATHETERS) ×2 IMPLANT
GUIDEWIRE .025 260CM (WIRE) ×2 IMPLANT
GUIDEWIRE ANGLED .035X150CM (WIRE) ×2 IMPLANT
KIT HEART LEFT (KITS) ×2 IMPLANT
PACK CARDIAC CATHETERIZATION (CUSTOM PROCEDURE TRAY) ×2 IMPLANT
SHEATH PINNACLE 5F 10CM (SHEATH) ×2 IMPLANT
SHEATH PINNACLE 7F 10CM (SHEATH) ×2 IMPLANT
SHEATH PROBE COVER 6X72 (BAG) ×2 IMPLANT
TRANSDUCER W/STOPCOCK (MISCELLANEOUS) ×2 IMPLANT
TUBING CIL FLEX 10 FLL-RA (TUBING) ×2 IMPLANT
WIRE EMERALD 3MM-J .025X260CM (WIRE) ×2 IMPLANT
WIRE EMERALD 3MM-J .035X150CM (WIRE) ×2 IMPLANT
WIRE HI TORQ VERSACORE-J 145CM (WIRE) ×2 IMPLANT

## 2021-05-11 NOTE — Progress Notes (Signed)
Notified [facility] for electrolyte replacement. K 3.6, Mag 1.8 in the setting of GI malabsorption w/pt on TPN and lasix gtt. Awaiting further orders.

## 2021-05-11 NOTE — Progress Notes (Signed)
Subjective: Patient lying in bed comfortably with 2 L [place].  Reports she feels much better. Has had improvement in breathing, feels less swollen.  She has had roast beef and sherbet for lunch. Denies nausea and vomiting.  Has some early satiety.  Denies bowel movements overnight or this morning.  She states she has had decreased movement of her left leg, had history of spinal stenosis.   Objective: Vital signs in last 24 hours: Temp:  [98 F (36.7 C)-99.2 F (37.3 C)] 98 F (36.7 C) (11/11 1213) Pulse Rate:  [0-110] 92 (11/11 1300) Resp:  [0-29] 17 (11/11 1300) BP: (88-149)/(57-97) 109/65 (11/11 1300) SpO2:  [0 %-100 %] 99 % (11/11 1300) Weight:  [55 kg] 55 kg (11/11 0500) Weight change: -6.2 kg Last BM Date: 05/09/21  PE: On oxygen via nasal cannula 2L, not using accessory muscles of respiration, complete sentences GENERAL: Puffy face  ABDOMEN: Non distended, soft, no tenderness noted, normoactive bowel sounds EXTREMITIES: No edema noted   Lab Results: Results for orders placed or performed during the hospital encounter of 05/02/21 (from the past 48 hour(s))  Glucose, capillary     Status: Abnormal   Collection Time: 05/09/21  1:32 PM  Result Value Ref Range   Glucose-Capillary 111 (H) 70 - 99 mg/dL    Comment: Glucose reference range applies only to samples taken after fasting for at least 8 hours.  Basic metabolic panel     Status: Abnormal   Collection Time: 05/09/21  5:25 PM  Result Value Ref Range   Sodium 135 135 - 145 mmol/L   Potassium 3.6 3.5 - 5.1 mmol/L   Chloride 100 98 - 111 mmol/L   CO2 28 22 - 32 mmol/L   Glucose, Bld 118 (H) 70 - 99 mg/dL    Comment: Glucose reference range applies only to samples taken after fasting for at least 8 hours.   BUN <5 (L) 8 - 23 mg/dL   Creatinine, Ser 0.45 0.44 - 1.00 mg/dL   Calcium 8.1 (L) 8.9 - 10.3 mg/dL   GFR, Estimated >60 >60 mL/min    Comment: (NOTE) Calculated using the CKD-EPI Creatinine Equation (2021)     Anion gap 7 5 - 15    Comment: Performed at [facility] [address], [place], [place] [address]  Magnesium     Status: None   Collection Time: 05/09/21  5:25 PM  Result Value Ref Range   Magnesium 2.1 1.7 - 2.4 mg/dL    Comment: Performed at [facility], [place] [address] [place], [place] [address]  Glucose, capillary     Status: Abnormal   Collection Time: 05/09/21  6:12 PM  Result Value Ref Range   Glucose-Capillary 100 (H) 70 - 99 mg/dL    Comment: Glucose reference range applies only to samples taken after fasting for at least 8 hours.  Glucose, capillary     Status: Abnormal   Collection Time: 05/10/21 12:40 AM  Result Value Ref Range   Glucose-Capillary 101 (H) 70 - 99 mg/dL    Comment: Glucose reference range applies only to samples taken after fasting for at least 8 hours.  CBC     Status: Abnormal   Collection Time: 05/10/21  4:40 AM  Result Value Ref Range   WBC 6.3 4.0 - 10.5 K/uL   RBC 2.50 (L) 3.87 - 5.11 MIL/uL   Hemoglobin 8.3 (L) 12.0 - 15.0 g/dL   HCT 23.6 (L) 36.0 - 46.0 %  MCV 94.4 80.0 - 100.0 fL   MCH 33.2 26.0 - 34.0 pg   MCHC 35.2 30.0 - 36.0 g/dL   RDW 16.0 (H) 11.5 - 15.5 %   Platelets 209 150 - 400 K/uL   nRBC 0.0 0.0 - 0.2 %    Comment: Performed at [facility] [address], [place], [place] [address]  Magnesium     Status: Abnormal   Collection Time: 05/10/21  4:40 AM  Result Value Ref Range   Magnesium 1.6 (L) 1.7 - 2.4 mg/dL    Comment: Performed at [facility] [address] [place], [place] [address]  Comprehensive metabolic panel     Status: Abnormal   Collection Time: 05/10/21  4:40 AM  Result Value Ref Range   Sodium 134 (L) 135 - 145 mmol/L   Potassium 3.1 (L) 3.5 - 5.1 mmol/L   Chloride 96 (L) 98 - 111 mmol/L   CO2 30 22 - 32 mmol/L   Glucose, Bld 103 (H) 70 - 99 mg/dL    Comment: Glucose reference range applies only to samples taken after fasting for at least 8 hours.   BUN <5 (L) 8 - 23 mg/dL    Creatinine, Ser 0.36 (L) 0.44 - 1.00 mg/dL   Calcium 7.9 (L) 8.9 - 10.3 mg/dL   Total Protein 4.3 (L) 6.5 - 8.1 g/dL   Albumin 1.9 (L) 3.5 - 5.0 g/dL   AST 22 15 - 41 U/L   ALT 14 0 - 44 U/L   Alkaline Phosphatase 92 38 - 126 U/L   Total Bilirubin 0.6 0.3 - 1.2 mg/dL   GFR, Estimated >60 >60 mL/min    Comment: (NOTE) Calculated using the CKD-EPI Creatinine Equation (2021)    Anion gap 8 5 - 15    Comment: Performed at [facility], [place] [address], [place], [place] [address]  Phosphorus     Status: None   Collection Time: 05/10/21  4:40 AM  Result Value Ref Range   Phosphorus 3.6 2.5 - 4.6 mg/dL    Comment: Performed at [facility] [address], [place], [place] [address]  Heparin level (unfractionated)     Status: Abnormal   Collection Time: 05/10/21  4:40 AM  Result Value Ref Range   Heparin Unfractionated >1.10 (H) 0.30 - 0.70 IU/mL    Comment: (NOTE) The clinical reportable range upper limit is being lowered to >1.10 to align with the FDA approved guidance for the current laboratory assay.  If heparin results are below expected values, and patient dosage has  been confirmed, suggest follow up testing of antithrombin III levels. Performed at [facility], [place] [address], [place], [place] [address]   Triglycerides     Status: None   Collection Time: 05/10/21  4:40 AM  Result Value Ref Range   Triglycerides 61 <150 mg/dL    Comment: Performed at [facility] [address] [place], [place] [address]  APTT     Status: Abnormal   Collection Time: 05/10/21  4:40 AM  Result Value Ref Range   aPTT >200 (HH) 24 - 36 seconds    Comment:        IF BASELINE aPTT IS ELEVATED, SUGGEST PATIENT RISK ASSESSMENT BE USED TO DETERMINE APPROPRIATE ANTICOAGULANT THERAPY. REPEATED TO VERIFY CRITICAL RESULT CALLED TO, READ BACK BY AND VERIFIED WITH: S.JAMES,RN [id number] 05/10/21 CLARK,S Performed at [facility], [address], [place], [place] [address]  Glucose, capillary     Status: None   Collection Time: 05/10/21  6:36 AM  Result Value Ref Range   Glucose-Capillary 95 70 - 99 mg/dL    Comment: Glucose reference range applies only to samples taken after fasting for at least 8 hours.  APTT     Status: Abnormal   Collection Time: 05/10/21  9:14 AM  Result Value Ref Range   aPTT 138 (H) 24 - 36 seconds    Comment:        IF BASELINE aPTT IS ELEVATED, SUGGEST PATIENT RISK ASSESSMENT BE USED TO DETERMINE APPROPRIATE ANTICOAGULANT THERAPY. Performed at [facility], [place] [address], [place], [place] [address]   Glucose, capillary     Status: Abnormal   Collection Time: 05/10/21 11:15 AM  Result Value Ref Range   Glucose-Capillary 117 (H) 70 - 99 mg/dL    Comment: Glucose reference range applies only to samples taken after fasting for at least 8 hours.  Basic metabolic panel     Status: Abnormal   Collection Time: 05/10/21  6:00 PM  Result Value Ref Range   Sodium 137 135 - 145 mmol/L   Potassium 2.8 (L) 3.5 - 5.1 mmol/L   Chloride 95 (L) 98 - 111 mmol/L   CO2 35 (H) 22 - 32 mmol/L   Glucose, Bld 96 70 - 99 mg/dL    Comment: Glucose reference range applies only to samples taken after fasting for at least 8 hours.   BUN <5 (L) 8 - 23 mg/dL   Creatinine, Ser 0.49 0.44 - 1.00 mg/dL   Calcium 7.9 (L) 8.9 - 10.3 mg/dL   GFR, Estimated >60 >60 mL/min    Comment: (NOTE) Calculated using the CKD-EPI Creatinine Equation (2021)    Anion gap 7 5 - 15    Comment: Performed at [facility] [address], [place], [place] [address]  Magnesium     Status: None   Collection Time: 05/10/21  6:00 PM  Result Value Ref Range   Magnesium 1.7 1.7 - 2.4 mg/dL    Comment: Performed at [facility], [place] [address] [place], [place] [address]  Glucose, capillary     Status: Abnormal   Collection Time: 05/10/21  6:25 PM  Result Value Ref Range   Glucose-Capillary 115 (H) 70 - 99 mg/dL    Comment: Glucose reference range applies  only to samples taken after fasting for at least 8 hours.  APTT     Status: Abnormal   Collection Time: 05/10/21  9:17 PM  Result Value Ref Range   aPTT 38 (H) 24 - 36 seconds    Comment:        IF BASELINE aPTT IS ELEVATED, SUGGEST PATIENT RISK ASSESSMENT BE USED TO DETERMINE APPROPRIATE ANTICOAGULANT THERAPY. Performed at [facility], [place] [address], [place], [place] [address]   Glucose, capillary     Status: Abnormal   Collection Time: 05/10/21 11:38 PM  Result Value Ref Range   Glucose-Capillary 120 (H) 70 - 99 mg/dL    Comment: Glucose reference range applies only to samples taken after fasting for at least 8 hours.  CBC     Status: Abnormal   Collection Time: 05/11/21  3:17 AM  Result Value Ref Range   WBC 7.0 4.0 - 10.5 K/uL    Comment: WHITE COUNT CONFIRMED ON SMEAR   RBC 2.63 (L) 3.87 - 5.11 MIL/uL   Hemoglobin 8.9 (L) 12.0 - 15.0 g/dL  HCT 24.6 (L) 36.0 - 46.0 %   MCV 93.5 80.0 - 100.0 fL   MCH 33.8 26.0 - 34.0 pg   MCHC 36.2 (H) 30.0 - 36.0 g/dL   RDW 15.9 (H) 11.5 - 15.5 %   Platelets 224 150 - 400 K/uL   nRBC 0.0 0.0 - 0.2 %    Comment: Performed at [facility] [address] [place], [place] [address]  Magnesium     Status: None   Collection Time: 05/11/21  3:17 AM  Result Value Ref Range   Magnesium 1.8 1.7 - 2.4 mg/dL    Comment: Performed at [facility] [address], [place], [place] [address]  Phosphorus     Status: None   Collection Time: 05/11/21  3:17 AM  Result Value Ref Range   Phosphorus 2.7 2.5 - 4.6 mg/dL    Comment: Performed at [facility] [address], [place], [place] [address]  Basic metabolic panel     Status: Abnormal   Collection Time: 05/11/21  3:17 AM  Result Value Ref Range   Sodium 137 135 - 145 mmol/L   Potassium 3.6 3.5 - 5.1 mmol/L    Comment: DELTA CHECK NOTED   Chloride 96 (L) 98 - 111 mmol/L   CO2 36 (H) 22 - 32 mmol/L   Glucose, Bld 106 (H) 70 - 99 mg/dL    Comment: Glucose reference  range applies only to samples taken after fasting for at least 8 hours.   BUN 5 (L) 8 - 23 mg/dL   Creatinine, Ser 0.44 0.44 - 1.00 mg/dL   Calcium 7.6 (L) 8.9 - 10.3 mg/dL   GFR, Estimated >60 >60 mL/min    Comment: (NOTE) Calculated using the CKD-EPI Creatinine Equation (2021)    Anion gap 5 5 - 15    Comment: Performed at [facility] [address], [place], [place] [address]  Heparin level (unfractionated)     Status: Abnormal   Collection Time: 05/11/21  3:17 AM  Result Value Ref Range   Heparin Unfractionated >1.10 (H) 0.30 - 0.70 IU/mL    Comment: (NOTE) The clinical reportable range upper limit is being lowered to >1.10 to align with the FDA approved guidance for the current laboratory assay.  If heparin results are below expected values, and patient dosage has  been confirmed, suggest follow up testing of antithrombin III levels. Performed at [facility], [place] [address] [place],  [address]   APTT     Status: Abnormal   Collection Time: 05/11/21  3:17 AM  Result Value Ref Range   aPTT 70 (H) 24 - 36 seconds    Comment:        IF BASELINE aPTT IS ELEVATED, SUGGEST PATIENT RISK ASSESSMENT BE USED TO DETERMINE APPROPRIATE ANTICOAGULANT THERAPY. Performed at [facility], [place] [address], [place], [place] [address]   Glucose, capillary     Status: Abnormal   Collection Time: 05/11/21  5:30 AM  Result Value Ref Range   Glucose-Capillary 125 (H) 70 - 99 mg/dL    Comment: Glucose reference range applies only to samples taken after fasting for at least 8 hours.  I-STAT 7, (LYTES, BLD GAS, ICA, H+H)     Status: Abnormal   Collection Time: 05/11/21 10:07 AM  Result Value Ref Range   pH, Arterial 7.509 (H) 7.350 - 7.450   pCO2 arterial 45.6 32.0 - 48.0 mmHg   pO2, Arterial 51 (  L) 83.0 - 108.0 mmHg   Bicarbonate 36.3 (H) 20.0 - 28.0 mmol/L   TCO2 38 (H) 22 - 32 mmol/L   O2 Saturation 89.0 %   Acid-Base Excess 12.0 (H) 0.0 - 2.0 mmol/L   Sodium  135 135 - 145 mmol/L   Potassium 3.2 (L) 3.5 - 5.1 mmol/L   Calcium, Ion 1.13 (L) 1.15 - 1.40 mmol/L   HCT 28.0 (L) 36.0 - 46.0 %   Hemoglobin 9.5 (L) 12.0 - 15.0 g/dL   Sample type ARTERIAL   POCT I-Stat EG7     Status: Abnormal   Collection Time: 05/11/21 10:12 AM  Result Value Ref Range   pH, Ven 7.459 (H) 7.250 - 7.430   pCO2, Ven 53.9 44.0 - 60.0 mmHg   pO2, Ven 28.0 (LL) 32.0 - 45.0 mmHg   Bicarbonate 38.3 (H) 20.0 - 28.0 mmol/L   TCO2 40 (H) 22 - 32 mmol/L   O2 Saturation 55.0 %   Acid-Base Excess 13.0 (H) 0.0 - 2.0 mmol/L   Sodium 135 135 - 145 mmol/L   Potassium 3.3 (L) 3.5 - 5.1 mmol/L   Calcium, Ion 1.14 (L) 1.15 - 1.40 mmol/L   HCT 29.0 (L) 36.0 - 46.0 %   Hemoglobin 9.9 (L) 12.0 - 15.0 g/dL   Sample type VENOUS    Comment NOTIFIED PHYSICIAN   POCT I-Stat EG7     Status: Abnormal   Collection Time: 05/11/21 10:12 AM  Result Value Ref Range   pH, Ven 7.470 (H) 7.250 - 7.430   pCO2, Ven 53.3 44.0 - 60.0 mmHg   pO2, Ven 29.0 (LL) 32.0 - 45.0 mmHg   Bicarbonate 38.7 (H) 20.0 - 28.0 mmol/L   TCO2 40 (H) 22 - 32 mmol/L   O2 Saturation 58.0 %   Acid-Base Excess 13.0 (H) 0.0 - 2.0 mmol/L   Sodium 135 135 - 145 mmol/L   Potassium 3.2 (L) 3.5 - 5.1 mmol/L   Calcium, Ion 1.14 (L) 1.15 - 1.40 mmol/L   HCT 29.0 (L) 36.0 - 46.0 %   Hemoglobin 9.9 (L) 12.0 - 15.0 g/dL   Sample type VENOUS    Comment NOTIFIED PHYSICIAN   POCT Activated clotting time     Status: None   Collection Time: 05/11/21 10:19 AM  Result Value Ref Range   Activated Clotting Time 150 seconds    Comment: Reference range 74-137 seconds for patients not on anticoagulant therapy.  Glucose, capillary     Status: Abnormal   Collection Time: 05/11/21 12:10 PM  Result Value Ref Range   Glucose-Capillary 107 (H) 70 - 99 mg/dL    Comment: Glucose reference range applies only to samples taken after fasting for at least 8 hours.    Studies/Results: CARDIAC CATHETERIZATION  Result Date: 05/11/2021   Prox  LAD lesion is 20% stenosed.   Ost LAD to Prox LAD lesion is 20% stenosed. No  obstructive CAD with minimal coronary calcification at the ostium and proximal LAD with 20% narrowing.  Otherwise normal coronary arteries. Normal right heart pressures Findings compatible with a nonischemic cardiomyopathy. Significant calcification in the femoral arteries right greater than left with significant luminal calcification in the right femoral artery. RECOMMENDATION: Guideline directed medical therapy for severe LV dysfunction.  Avoidance of QT prolongation medications.  Patient has significant claudication symptomatology.   VAS [place] UPPER EXTREMITY VENOUS DUPLEX  Result Date: 05/10/2021 UPPER VENOUS STUDY  Patient Name:  Debbie Barry  Date of Exam:   05/09/2021 Medical Rec #:  [id number]           Accession #:    [id number] Date of Birth: 01-Jul-1951          Patient Gender: F Patient Age:   8 years Exam Location:  [facility] Procedure:      VAS [place] UPPER EXTREMITY VENOUS DUPLEX Referring Phys: Trevor Mace --------------------------------------------------------------------------------  Indications: Edema Comparison Study: No previous exams Performing Technologist: Rogelia Rohrer RVT, RDMS Supporting Technologist: RVT, RDMS  Examination Guidelines: A complete evaluation includes B-mode imaging, spectral Doppler, color Doppler, and power Doppler as needed of all accessible portions of each vessel. Bilateral testing is considered an integral part of a complete examination. Limited examinations for reoccurring indications may be performed as noted.  Right Findings: +----------+------------+---------+-----------+----------+-------+ RIGHT     CompressiblePhasicitySpontaneousPropertiesSummary +----------+------------+---------+-----------+----------+-------+ Subclavian    Full       Yes       Yes                      +----------+------------+---------+-----------+----------+-------+  Left Findings:  +----------+------------+---------+-----------+----------+-------+ LEFT      CompressiblePhasicitySpontaneousPropertiesSummary +----------+------------+---------+-----------+----------+-------+ IJV           Full       Yes       Yes                      +----------+------------+---------+-----------+----------+-------+ Subclavian    Full       Yes       Yes                      +----------+------------+---------+-----------+----------+-------+ Axillary      Full       Yes       Yes                      +----------+------------+---------+-----------+----------+-------+ Brachial      Full       Yes       Yes                      +----------+------------+---------+-----------+----------+-------+ Radial        Full                                          +----------+------------+---------+-----------+----------+-------+ Ulnar         Full                                          +----------+------------+---------+-----------+----------+-------+ Cephalic      Full                                          +----------+------------+---------+-----------+----------+-------+ Basilic       Full       Yes       Yes                      +----------+------------+---------+-----------+----------+-------+  Summary:  Right: No evidence of thrombosis in the subclavian.  Left: No evidence of deep vein thrombosis in the upper extremity. No evidence of superficial vein thrombosis in the upper extremity.  *See table(s) above for  measurements and observations.  Diagnosing physician: Monica Martinez MD Electronically signed by Monica Martinez MD on 05/10/2021 at 3:32:10 PM.    Final    ECHOCARDIOGRAM LIMITED  Result Date: 05/09/2021    ECHOCARDIOGRAM LIMITED REPORT   Patient Name:   Debbie Barry Date of Exam: 05/09/2021 Medical Rec #:  [id number]          Height:       61.0 in Accession #:    [id number]         Weight:       134.9 lb Date of Birth:  [birth year]          BSA:          1.598 m Patient Age:    70 years           BP:           135/93 mmHg Patient Gender: F                  HR:           117 bpm. Exam Location:  Inpatient Procedure: 2D Echo Indications:    acute systolic chf  History:        Patient has prior history of Echocardiogram examinations, most                 recent 05/07/2021.  Sonographer:    Johny Chess RDCS Referring Phys: [id number] [place]  1. Left ventricular ejection fraction, by estimation, is 25 to 30%. The left ventricle has severely decreased function. The left ventricle demonstrates global hypokinesis. Left ventricular diastolic parameters are consistent with Grade I diastolic dysfunction (impaired relaxation).  2. Right ventricular systolic function is moderately reduced. The right ventricular size is mildly enlarged. Tricuspid regurgitation signal is inadequate for assessing PA pressure.  3. The mitral valve is normal in structure. Mild mitral valve regurgitation. No evidence of mitral stenosis.  4. The aortic valve is tricuspid. Aortic valve regurgitation is not visualized. Mild aortic valve sclerosis is present, with no evidence of aortic valve stenosis.  5. The inferior vena cava is dilated in size with <50% respiratory variability, suggesting right atrial pressure of 15 mmHg.  6. There is a left pleural effusion.  7. Limited echo, biventricular dysfunction. FINDINGS  Left Ventricle: Left ventricular ejection fraction, by estimation, is 25 to 30%. The left ventricle has severely decreased function. The left ventricle demonstrates global hypokinesis. The left ventricular internal cavity size was normal in size. There is no left ventricular hypertrophy. Left ventricular diastolic parameters are consistent with Grade I diastolic dysfunction (impaired relaxation). Right Ventricle: The right ventricular size is mildly enlarged. Right ventricular systolic function is moderately reduced. Tricuspid regurgitation signal  is inadequate for assessing PA pressure. Pericardium: There is a left pleural effusion. There is no evidence of pericardial effusion. Mitral Valve: The mitral valve is normal in structure. Mild mitral annular calcification. Mild mitral valve regurgitation. No evidence of mitral valve stenosis. Tricuspid Valve: The tricuspid valve is normal in structure. Tricuspid valve regurgitation is not demonstrated. Aortic Valve: The aortic valve is tricuspid. Aortic valve regurgitation is not visualized. Mild aortic valve sclerosis is present, with no evidence of aortic valve stenosis. Venous: The inferior vena cava is dilated in size with less than 50% respiratory variability, suggesting right atrial pressure of 15 mmHg. LEFT VENTRICLE PLAX 2D LVIDd:         5.00 cm     Diastology LVIDs:  4.00 cm     LV e' lateral: 7.83 cm/s LV PW:         0.80 cm LV IVS:        0.60 cm  LV Volumes (MOD) LV vol d, MOD A4C: 71.2 ml LV vol s, MOD A4C: 46.4 ml LV SV MOD A4C:     71.2 ml IVC IVC diam: 2.30 cm LEFT ATRIUM         Index LA diam:    3.40 cm 2.13 cm/m  AORTIC VALVE LVOT Vmax:   86.80 cm/s LVOT Vmean:  63.000 cm/s LVOT VTI:    0.122 m  AORTA Ao Asc diam: 2.90 cm  SHUNTS Systemic VTI: 0.12 m [place] Electronically signed by Franki Monte Signature Date/Time: 05/09/2021/6:52:14 PM    Final     Medications: I have reviewed the patient's current medications.  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   Ventricular tachycardia, cardiac arrest, QT prolongation, systolic heart failure with EF  of 25 to 30% Right and left cath today- non obstructive CAD, nonischemic cardiomyopathy.  Acute hypoxic respiratory failure  Plan: Patient unable to tolerate postpyloric feeding tube placement, started on IV TPN for nutritional support. Discussed about IR guided feeding tube placement in the future if patient unable to meet nutritional needs orally but patient is improving with eating status at this time. Continue to monitor.  Continue cholestyramine 4 g twice a day for diarrhea, if needed can be increased to 4 times a day or decreased to once a day if constipation.  Continue sucralfate 4 times a day Guarded prognosis, will need prolonged nutritional support with low-fat, high-protein, high medium change triglycerides containing diet to improve low albumin.  Ronnette Juniper, MD 05/11/2021, 1:27 PM

## 2021-05-11 NOTE — Progress Notes (Signed)
ANTICOAGULATION CONSULT NOTE - Follow Up Consult  Pharmacy Consult for Heparin Indication: DVT  No Known Allergies  Patient Measurements: Height: 5\' 1"  (154.9 cm) Weight: 55 kg (121 lb 4.1 oz) IBW/kg (Calculated) : 47.8 Heparin Dosing Weight: 61.5 kg  Vital Signs: Temp: 99.1 F (37.3 C) (11/10 1957) Temp Source: Oral (11/10 1957) BP: 124/88 (11/11 0700) Pulse Rate: 110 (11/11 0700)  Labs: Recent Labs    05/09/21 0635 05/09/21 1725 05/10/21 0440 05/10/21 0914 05/10/21 1800 05/10/21 2117 05/11/21 0317  HGB 9.6*  --  8.3*  --   --   --  8.9*  HCT 27.7*  --  23.6*  --   --   --  24.6*  PLT 273  --  209  --   --   --  224  APTT  --    < > >200* 138*  --  38* 70*  HEPARINUNFRC  --   --  >1.10*  --   --   --  >1.10*  CREATININE 0.40*   < > 0.36*  --  0.49  --  0.44   < > = values in this interval not displayed.    Estimated Creatinine Clearance: 51.5 mL/min (by C-G formula based on SCr of 0.44 mg/dL).   Medications:  Scheduled:   acetaZOLAMIDE  500 mg Oral Once   carvedilol  3.125 mg Oral BID WC   Chlorhexidine Gluconate Cloth  6 each Topical Daily   cholestyramine light  4 g Oral 2 times per day on Mon Tue Wed Thu Fri Sat   insulin aspart  0-9 Units Subcutaneous Q6H   losartan  25 mg Oral Daily   mouth rinse  15 mL Mouth Rinse BID   megestrol  400 mg Oral BID   potassium chloride  40 mEq Oral BID   sodium chloride flush  10-40 mL Intracatheter Q12H   sodium chloride flush  3 mL Intravenous Q12H   spironolactone  25 mg Oral Daily   sucralfate  1 g Oral TID WC & HS   Infusions:   sodium chloride Stopped (05/11/21 0546)   furosemide (LASIX) 200 mg in dextrose 5% 100 mL (2mg /mL) infusion 8 mg/hr (05/11/21 0600)   heparin 700 Units/hr (05/11/21 0600)   magnesium sulfate bolus IVPB 2 g (05/11/21 0704)   potassium chloride 10 mEq (05/11/21 0702)   thiamine injection Stopped (05/10/21 2314)   TPN ADULT (ION) 45 mL/hr at 05/11/21 0600    Assessment: 70 yo female  with DVT and renal infarct on apixaban. She is noted s/p cardiac arrest 11/7 and and EF 40-45%.  She is being started on TPN for malabsorption and also changing to IV heparin. Possible cardiac cath when more stable. Pharmacy consulted for heparin dosing.   Patient was previously on heparin at 800 units/hr and heparin level was at goal on this rate. Last dose of apixaban 10 mg given 11/9 at ~10am.    On 11/10, attempted to move heparin to PIV line, but infiltrated. Heparin infusion was moved back to PICC line.   aPTT therapeutic at 70 this morning, and heparin level elevated > 1.1, on 700 units/hr. Per RN, heparin was held and line was flushed prior to lab draw from PICC line.   Goal of Therapy:  Heparin level 0.3-0.7 units/ml aPTT 66-102 seconds Monitor platelets by anticoagulation protocol: Yes   Plan:  Increase heparin slightly to 750 units/hr. Check ~6hr aPTT, heparin level.  Daily CBC, aPTT, heparin level. Monitor for signs/symptoms of  bleeding.   Vance Peper, PharmD PGY1 Pharmacy Resident Phone [phone] 05/11/2021 7:35 AM   Please check AMION for all [place] phone numbers After 10:00 PM, call [place] [phone]

## 2021-05-11 NOTE — Plan of Care (Signed)

## 2021-05-11 NOTE — Progress Notes (Signed)
eLink Physician-Brief Progress Note Patient Name: Verina Galeno DOB: [birth year] MRN: [id number]   Date of Service  05/11/2021  HPI/Events of Note  K+ 3.6  eICU Interventions  KCL 10 meq iv Q 1 hour x 2 doses ordered.        [name] Daya Dutt 05/11/2021, 5:26 AM

## 2021-05-11 NOTE — Progress Notes (Signed)
[facility] Progress Note Patient Name: Marijah Larranaga DOB: Sep 05, 1950 MRN: [id number]   Date of Service  05/11/2021  HPI/Events of Note  K+ 3.6, Mg+ 1.8, Creatinine 0.44  eICU Interventions  E-Link Nurse Driven Electrolyte Replacement Protocol ordered.        [name] Angalena Cousineau 05/11/2021, 4:40 AM

## 2021-05-11 NOTE — Progress Notes (Signed)
Cardiology Progress Note  Patient ID: Debbie Barry MRN: [id number] DOB: February 20, 1951 Date of Encounter: 05/11/2021  Primary Cardiologist: Evalina Field, MD  Subjective   Chief Complaint: Shortness of breath  HPI: Shortness of breath improving with diuresis.  Still volume overloaded.  Discussed left and right heart cath today.  Willing to proceed.  ROS:  All other ROS reviewed and negative. Pertinent positives noted in the HPI.     Inpatient Medications  Scheduled Meds:  acetaZOLAMIDE  500 mg Oral Once   carvedilol  3.125 mg Oral BID WC   Chlorhexidine Gluconate Cloth  6 each Topical Daily   cholestyramine light  4 g Oral 2 times per day on Mon Tue Wed Thu Fri Sat   insulin aspart  0-9 Units Subcutaneous Q6H   losartan  25 mg Oral Daily   mouth rinse  15 mL Mouth Rinse BID   megestrol  400 mg Oral BID   potassium chloride  40 mEq Oral BID   sodium chloride flush  10-40 mL Intracatheter Q12H   sodium chloride flush  3 mL Intravenous Q12H   spironolactone  25 mg Oral Daily   sucralfate  1 g Oral TID WC & HS   Continuous Infusions:  sodium chloride 10 mL/hr at 05/11/21 0700   furosemide (LASIX) 200 mg in dextrose 5% 100 mL (2mg /mL) infusion 8 mg/hr (05/11/21 0700)   heparin 700 Units/hr (05/11/21 0700)   magnesium sulfate bolus IVPB 2 g (05/11/21 0704)   potassium chloride 10 mEq (05/11/21 0702)   thiamine injection Stopped (05/10/21 2314)   TPN ADULT (ION) 45 mL/hr at 05/11/21 0700   PRN Meds: sodium chloride, acetaminophen, HYDROcodone-acetaminophen, lidocaine (PF), loperamide, melatonin, sodium chloride flush, sodium chloride flush   Vital Signs   Vitals:   05/11/21 0400 05/11/21 0500 05/11/21 0600 05/11/21 0700  BP: (!) 119/59 125/75 117/64 124/88  Pulse: (!) 102 (!) 106 98 (!) 110  Resp: (!) 22 (!) 21 16 14   Temp:      TempSrc:      SpO2: 92% 98% 99% 99%  Weight:  55 kg    Height:        Intake/Output Summary (Last 24 hours) at 05/11/2021 0724 Last  data filed at 05/11/2021 0700 Gross per 24 hour  Intake 2173.48 ml  Output 5325 ml  Net -3151.52 ml   Last 3 Weights 05/11/2021 05/10/2021 05/09/2021  Weight (lbs) 121 lb 4.1 oz 129 lb 3 oz 134 lb 14.7 oz  Weight (kg) 55 kg 58.6 kg 61.2 kg     Telemetry  Overnight telemetry shows ST 100s, which I personally reviewed.   Physical Exam   Vitals:   05/11/21 0400 05/11/21 0500 05/11/21 0600 05/11/21 0700  BP: (!) 119/59 125/75 117/64 124/88  Pulse: (!) 102 (!) 106 98 (!) 110  Resp: (!) 22 (!) 21 16 14   Temp:      TempSrc:      SpO2: 92% 98% 99% 99%  Weight:  55 kg    Height:        Intake/Output Summary (Last 24 hours) at 05/11/2021 0724 Last data filed at 05/11/2021 0700 Gross per 24 hour  Intake 2173.48 ml  Output 5325 ml  Net -3151.52 ml    Last 3 Weights 05/11/2021 05/10/2021 05/09/2021  Weight (lbs) 121 lb 4.1 oz 129 lb 3 oz 134 lb 14.7 oz  Weight (kg) 55 kg 58.6 kg 61.2 kg    Body mass index is 22.91 kg/m.  General: Well nourished, well developed, in no acute distress Head: Atraumatic, normal size  Eyes: PEERLA, EOMI  Neck: Supple, no JVD Endocrine: No thryomegaly Cardiac: Normal S1, S2; tachycardia noted  Lungs: diminished breath sounds bilaterally  Abd: Soft, nontender, no hepatomegaly  Ext: 1+ edema  Musculoskeletal: No deformities, BUE and BLE strength normal and equal Skin: Warm and dry, no rashes   Neuro: Alert and oriented to person, place, time, and situation, CNII-XII grossly intact, no focal deficits  Psych: Normal mood and affect   Labs  High Sensitivity Troponin:   Recent Labs  Lab 05/02/21 1945 05/03/21 0304  TROPONINIHS 28* 32*     Cardiac EnzymesNo results for input(s): TROPONINI in the last 168 hours. No results for input(s): TROPIPOC in the last 168 hours.  Chemistry Recent Labs  Lab 05/07/21 0855 05/07/21 1825 05/07/21 2343 05/08/21 0520 05/10/21 0440 05/10/21 1800 05/11/21 0317  NA 138   < > 134*   < > 134* 137 137  K 2.6*    < > 2.5*   < > 3.1* 2.8* 3.6  CL 108   < > 99   < > 96* 95* 96*  CO2 24   < > 29   < > 30 35* 36*  GLUCOSE 74   < > 153*   < > 103* 96 106*  BUN <5*   < > <5*   < > <5* <5* 5*  CREATININE 0.38*   < > 0.48   < > 0.36* 0.49 0.44  CALCIUM 5.5*   < > 7.4*   < > 7.9* 7.9* 7.6*  PROT 3.4*  --  4.0*  --  4.3*  --   --   ALBUMIN <1.5*  --  1.7*  --  1.9*  --   --   AST 50*  --  29  --  22  --   --   ALT 11  --  16  --  14  --   --   ALKPHOS 89  --  96  --  92  --   --   BILITOT 0.6  --  0.8  --  0.6  --   --   GFRNONAA >60   < > >60   < > >60 >60 >60  ANIONGAP 6   < > 6   < > 8 7 5    < > = values in this interval not displayed.    Hematology Recent Labs  Lab 05/09/21 0635 05/10/21 0440 05/11/21 0317  WBC 8.4 6.3 7.0  RBC 2.90* 2.50* 2.63*  HGB 9.6* 8.3* 8.9*  HCT 27.7* 23.6* 24.6*  MCV 95.5 94.4 93.5  MCH 33.1 33.2 33.8  MCHC 34.7 35.2 36.2*  RDW 16.0* 16.0* 15.9*  PLT 273 209 224   BNP Recent Labs  Lab 05/07/21 0900  BNP 482.9*    DDimer No results for input(s): DDIMER in the last 168 hours.   Radiology  VAS [place] UPPER EXTREMITY VENOUS DUPLEX  Result Date: 05/10/2021 UPPER VENOUS STUDY  Patient Name:  Alejandria Wessells  Date of Exam:   05/09/2021 Medical Rec #: [id number]           Accession #:    [id number] Date of Birth: Aug 14, 1950          Patient Gender: F Patient Age:   71 years Exam Location:  [facility] Procedure:      VAS [place] UPPER EXTREMITY VENOUS DUPLEX Referring Phys: Trevor Mace --------------------------------------------------------------------------------  Indications: Edema Comparison Study: No previous exams Performing Technologist: Jody Hill RVT, RDMS Supporting Technologist: RVT, RDMS  Examination Guidelines: A complete evaluation includes B-mode imaging, spectral Doppler, color Doppler, and power Doppler as needed of all accessible portions of each vessel. Bilateral testing is considered an integral part of a complete examination. Limited examinations  for reoccurring indications may be performed as noted.  Right Findings: +----------+------------+---------+-----------+----------+-------+ RIGHT     CompressiblePhasicitySpontaneousPropertiesSummary +----------+------------+---------+-----------+----------+-------+ Subclavian    Full       Yes       Yes                      +----------+------------+---------+-----------+----------+-------+  Left Findings: +----------+------------+---------+-----------+----------+-------+ LEFT      CompressiblePhasicitySpontaneousPropertiesSummary +----------+------------+---------+-----------+----------+-------+ IJV           Full       Yes       Yes                      +----------+------------+---------+-----------+----------+-------+ Subclavian    Full       Yes       Yes                      +----------+------------+---------+-----------+----------+-------+ Axillary      Full       Yes       Yes                      +----------+------------+---------+-----------+----------+-------+ Brachial      Full       Yes       Yes                      +----------+------------+---------+-----------+----------+-------+ Radial        Full                                          +----------+------------+---------+-----------+----------+-------+ Ulnar         Full                                          +----------+------------+---------+-----------+----------+-------+ Cephalic      Full                                          +----------+------------+---------+-----------+----------+-------+ Basilic       Full       Yes       Yes                      +----------+------------+---------+-----------+----------+-------+  Summary:  Right: No evidence of thrombosis in the subclavian.  Left: No evidence of deep vein thrombosis in the upper extremity. No evidence of superficial vein thrombosis in the upper extremity.  *See table(s) above for measurements and observations.   Diagnosing physician: Monica Martinez MD Electronically signed by Monica Martinez MD on 05/10/2021 at 3:32:10 PM.    Final    ECHOCARDIOGRAM LIMITED  Result Date: 05/09/2021    ECHOCARDIOGRAM LIMITED REPORT   Patient Name:   SASCHA PALMA Date of Exam: 05/09/2021 Medical Rec #:  [id number]  Height:       61.0 in Accession #:    [id number]         Weight:       134.9 lb Date of Birth:  09-Sep-1950         BSA:          1.598 m Patient Age:    51 years           BP:           135/93 mmHg Patient Gender: F                  HR:           117 bpm. Exam Location:  Inpatient Procedure: 2D Echo Indications:    acute systolic chf  History:        Patient has prior history of Echocardiogram examinations, most                 recent 05/07/2021.  Sonographer:    Johny Chess RDCS Referring Phys: [id number] [place]  1. Left ventricular ejection fraction, by estimation, is 25 to 30%. The left ventricle has severely decreased function. The left ventricle demonstrates global hypokinesis. Left ventricular diastolic parameters are consistent with Grade I diastolic dysfunction (impaired relaxation).  2. Right ventricular systolic function is moderately reduced. The right ventricular size is mildly enlarged. Tricuspid regurgitation signal is inadequate for assessing PA pressure.  3. The mitral valve is normal in structure. Mild mitral valve regurgitation. No evidence of mitral stenosis.  4. The aortic valve is tricuspid. Aortic valve regurgitation is not visualized. Mild aortic valve sclerosis is present, with no evidence of aortic valve stenosis.  5. The inferior vena cava is dilated in size with <50% respiratory variability, suggesting right atrial pressure of 15 mmHg.  6. There is a left pleural effusion.  7. Limited echo, biventricular dysfunction. FINDINGS  Left Ventricle: Left ventricular ejection fraction, by estimation, is 25 to 30%. The left ventricle has severely decreased  function. The left ventricle demonstrates global hypokinesis. The left ventricular internal cavity size was normal in size. There is no left ventricular hypertrophy. Left ventricular diastolic parameters are consistent with Grade I diastolic dysfunction (impaired relaxation). Right Ventricle: The right ventricular size is mildly enlarged. Right ventricular systolic function is moderately reduced. Tricuspid regurgitation signal is inadequate for assessing PA pressure. Pericardium: There is a left pleural effusion. There is no evidence of pericardial effusion. Mitral Valve: The mitral valve is normal in structure. Mild mitral annular calcification. Mild mitral valve regurgitation. No evidence of mitral valve stenosis. Tricuspid Valve: The tricuspid valve is normal in structure. Tricuspid valve regurgitation is not demonstrated. Aortic Valve: The aortic valve is tricuspid. Aortic valve regurgitation is not visualized. Mild aortic valve sclerosis is present, with no evidence of aortic valve stenosis. Venous: The inferior vena cava is dilated in size with less than 50% respiratory variability, suggesting right atrial pressure of 15 mmHg. LEFT VENTRICLE PLAX 2D LVIDd:         5.00 cm     Diastology LVIDs:         4.00 cm     LV e' lateral: 7.83 cm/s LV PW:         0.80 cm LV IVS:        0.60 cm  LV Volumes (MOD) LV vol d, MOD A4C: 71.2 ml LV vol s, MOD A4C: 46.4 ml LV SV MOD A4C:     71.2 ml IVC  IVC diam: 2.30 cm LEFT ATRIUM         Index LA diam:    3.40 cm 2.13 cm/m  AORTIC VALVE LVOT Vmax:   86.80 cm/s LVOT Vmean:  63.000 cm/s LVOT VTI:    0.122 m  AORTA Ao Asc diam: 2.90 cm  SHUNTS Systemic VTI: 0.12 m [place] Electronically signed by Franki Monte Signature Date/Time: 05/09/2021/6:52:14 PM    Final     Cardiac Studies  TTE 05/09/2021  1. Left ventricular ejection fraction, by estimation, is 25 to 30%. The  left ventricle has severely decreased function. The left ventricle  demonstrates global  hypokinesis. Left ventricular diastolic parameters are  consistent with Grade I diastolic  dysfunction (impaired relaxation).   2. Right ventricular systolic function is moderately reduced. The right  ventricular size is mildly enlarged. Tricuspid regurgitation signal is  inadequate for assessing PA pressure.   3. The mitral valve is normal in structure. Mild mitral valve  regurgitation. No evidence of mitral stenosis.   4. The aortic valve is tricuspid. Aortic valve regurgitation is not  visualized. Mild aortic valve sclerosis is present, with no evidence of  aortic valve stenosis.   5. The inferior vena cava is dilated in size with <50% respiratory  variability, suggesting right atrial pressure of 15 mmHg.   6. There is a left pleural effusion.   7. Limited echo, biventricular dysfunction.   Patient Profile  70 year old female with history of hypertension, spinal stenosis iron overload, GI bleed who was admitted on 05/03/2021 for chest pain and anasarca.  Found to have new right renal infarct.  Cardiology consulted on 05/07/2021 after polymorphic ventricular tachycardia arrest.  Assessment & Plan   #Polymorphic ventricular tachycardia #Cardiac arrest #[place] secondary to QTC prolongation -She suffered polymorphic ventricular tachycardia arrest in the setting of severe QTC prolongation on 05/07/2021.  QTC was over 580. -She is had no further episodes.  Her magnesium was 0.8 at the time of her arrest. -Was briefly placed on isoproterenol and QTC has improved.  No further VT episodes. -No indications for ICD for secondary VT. -Her ejection fraction has reduced.  See discussion below. -Close monitoring of electrolytes.  Keep potassium above 4 magnesium above 2.  #Systolic heart failure, EF 25 to 30% #Acute hypoxic respiratory failure #Volume overload #Anasarca #Severe hypoalbuminemia #Severe protein deficiency -Echo this admission was 40-45% after cardiac arrest which is unchanged  from prior.  Her EF is now 25-30%. -This drop is occurred after her cardiac arrest. -She has very poor nutrition as well as had hypoalbuminemia for months.  She has anasarca from this. -Unclear how much her cardiomyopathy is playing into this.  Talking with critical care medicine there is possibility of nutritional deficiency to explain her cardiomyopathy.  No signs of cirrhosis on recent right upper quadrant ultrasound. -Given drop in EF, cardiac arrest we will proceed with left and right heart catheterization today.  She did complain of chest pain prior to admission.  I think we will go ahead and work this up with heart catheterization today.  She is willing to proceed. -In the interim, continue losartan 25 mg daily, Aldactone 25 mg daily.  I have added Coreg.  We can likely transition to [facility] this admission. -Holding on SGLT2 inhibitor as she is not very active.  I think she would be high risk for fungal UTI. -She is on Lasix drip per critical care medicine.  We will plan to transition to bolus dosing in the next day or  so. -GI is working her up for malabsorption.  Will likely ultimately need EGD and colonoscopy with biopsies.  Given recent cardiac arrest this will likely take place later.  We do appreciate their input.  Shared Decision Making/Informed Consent The risks [stroke (1 in 1000), death (1 in 1000), kidney failure [usually temporary] (1 in 500), bleeding (1 in 200), allergic reaction [possibly serious] (1 in 200)], benefits (diagnostic support and management of coronary artery disease) and alternatives of a cardiac catheterization were discussed in detail with Ms. Bassinger and she is willing to proceed.  #Acute Renal Infarct #RLE DVT -CT scan showed acute right renal infarct.  Blood cultures are negative.  No signs of endocarditis.  No A. fib on monitor.  She does have a right lower extremity DVT. -Intracardiac shunt could be a possibility however she will need to be on anticoagulation  regardless.  Unclear how this would change her management.  For now we will just continue anticoagulation. -She will undergo a right left heart catheterization today.  We will perform a shunt run to rule out intracardiac shunt.  Again she will be on anticoagulation for DVT likely indefinitely given portability status so unclear if this would change management.  For questions or updates, please contact [facility] Please consult [email or web address] for contact info under   Time Spent with Patient: I have spent a total of 35 minutes with patient reviewing hospital notes, telemetry, EKGs, labs and examining the patient as well as establishing an assessment and plan that was discussed with the patient.  > 50% of time was spent in direct patient care.    Signed, Addison Naegeli. Audie Box, MD, [place]  05/11/2021 7:24 AM

## 2021-05-11 NOTE — Progress Notes (Signed)
NAME:  Debbie Barry, MRN:  [id number], DOB:  01-16-1951, LOS: 33 ADMISSION DATE:  05/02/2021, CONSULTATION DATE:  11/7 REFERRING MD:  Dr. Pietro Cassis, CHIEF COMPLAINT:  VT/VF   History of Present Illness:  70 year old female with past medical history as below, which is significant for hypertension, spinal stenosis, gastric ulcers, and fatty liver.  She was recently admitted due to lower extremity weakness and was found to have spinal stenosis for which medical management was recommended.  She was discharged to SNF where course was relatively uneventful.  She presented again to [facility] emergency department on 11/3 with complaints of chest pain and palpitations.  Initial work-up significant for DVT and left-sided pleural effusion.  She also had diffuse anasarca.  This was all felt to be in the context of poor nutrition and low albumin.  She was treated with diuretics.  On 11/7 in the AM hours she suffered ventricular fibrillation cardiac arrest and underwent 3 minutes of CPR with return of spontaneous circulation prior to defibrillation. Then a few hours later she went into pulsatile VT, which was treated with cardioversion x 1. She was transferred to the ICU after cardiology consultation. K and mag were found to be low and supplementation was arranged. PCCM was consulted.   Pertinent  Medical History   has a past medical history of Fatty liver, GERD (gastroesophageal reflux disease), Hypertension, Osteomyelitis ([place]), Peripheral vascular disease ([place]), and Ulcer (08/14/20).   Significant Hospital Events: Including procedures, antibiotic start and stop dates in addition to other pertinent events   11/3 admitted with CP and palpitations.  Thoracentesis left for 500cc. 11/7 Cardiac arrest 11/8 developed respiratory distress with ST in the afternoon requiring escalation to non-rebreather. 11/9 TPN started. GI consulted for malnutrition. Working dx Severe hypoalbuminemia, anasarca associated with  nausea, vomiting, diarrhea and weight loss suspected to be secondary to protein-losing enteropathy 11/10 still hypoxic but tolerating diet. Aldactone  increased =. ARB added.  11/11 right and left heart cath, severely depressed LV   Interim History / Subjective:  overall states that she is feeling better.  Able to keep some food down  Objective   Blood pressure 106/67, pulse 98, temperature 98 F (36.7 C), temperature source Oral, resp. rate 13, height 5\' 1"  (1.549 m), weight 55 kg, SpO2 99 %.        Intake/Output Summary (Last 24 hours) at 05/11/2021 1440 Last data filed at 05/11/2021 1200 Gross per 24 hour  Intake 1594.62 ml  Output 4775 ml  Net -3180.38 ml   Filed Weights   05/09/21 1151 05/10/21 0442 05/11/21 0500  Weight: 61.2 kg 58.6 kg 55 kg    Examination: General, pleasant elderly female resting in bed HENT NCAT, tracking appropriately Lungs: Crackles in the base no wheeze Heart: Regular rhythm, S1-S2  abdomen: Soft nontender nondistended Extremities: Dependent edema, anasarca Neuro: Alert following commands   Resolved Hospital Problem list   VT (torsades) arrest: econdary to electrolyte derangements in the setting of aggressive diuresis. Bradycardic PVC seemed to cause rhythm change. Patient regained consciousness post arrest and did not require intubation.   Assessment & Plan:    Acute on chronic HFrEF: LVEF 40-45%. IVC down on echo despite anasarca pointing away from cardiac cause.  Sinus tachycardia, polymorphic VT and prolonged QTc with associated dyspnea/hypoxia when HR spikes.  Plan Continue losartan, Aldactone, beta-blocker per cardiology Continue diuresis with Lasix Continue to follow electrolytes and replete as needed  Severe Protein calorie malnutrition w/ diffuse Anasarca w/ severe hypoalbuminemia:  working dx at this point is protein loss enteropathy?  -gi now following -tolerating her diet today  Plan Continue TPN Continue calorie  count Continue diet advancement At some point can consider weaning from TPN Hold back on cholestyramine because she feels constipated female  Acute hypoxemic respiratory failure Bilateral pleural effusions: protein low and LDH marginally elevated, but no serum comparisons. Likely due to hypoalbuminemia as above.  Plan continue IV diuresis Chest x-ray as needed wean oxygen supplementation to maintain O2 sats greater than 90%  Fluid and electrolyte imbalance: Hypokalemia, hyponatremia, hypomagnesemia Plan Replete Follow closely for refeeding or electrolyte abnormalities secondary to diuresis  DVT Plan Cont IV heparin given abd absorption issues   Acute R renal infarct source unclear - ? Atrial fib history, per cards Plan Continue anticoagulation  Best Practice (right click and "Reselect all SmartList Selections" daily)   Diet/type: Regular consistency (see orders) DVT prophylaxis: DOAC GI prophylaxis: PPI Lines: N/A Foley:  N/A Code Status:  full code Last date of multidisciplinary goals of care discussion [ ]   Garner Nash, DO [facility] 05/11/2021 2:48 PM

## 2021-05-11 NOTE — Progress Notes (Addendum)
eLink Physician-Brief Progress Note Patient Name: Debbie Barry DOB: [birth year] MRN: [id number]   Date of Service  05/11/2021  HPI/Events of Note  Patient with bilateral lower extremity itching without obvious rash, she indicated to the bedside RN that hydrocortisone cream has been helpful in the past. She also  requests a dose of Hydroxyzine po with the Hydrocortisone cream as this has worked in the past.  eICU Interventions  1 % Hydrocortisone cream tid PRN itching ordered x 2 days. Hydroxyzine 25 mg po x 1.        [name] Octavio Matheney 05/11/2021, 11:03 PM

## 2021-05-11 NOTE — TOC Benefit Eligibility Note (Signed)
Patient Teacher, English as a foreign language completed.    The patient is currently admitted and upon discharge could be taking Entresto 24-26 mg.  The current 30 day co-pay is, $45.00.   The patient is insured through [place], [place] Patient Advocate Specialist [place] Patient Advocate Team Direct Number: [phone]  Fax: [phone]

## 2021-05-11 NOTE — Progress Notes (Signed)
BMP sent for ordered 1700 collection. Waiting for results.

## 2021-05-11 NOTE — Progress Notes (Signed)
32fr sheath removed from left femoral artery intact. Manual pressure applied x 20 min. No bleeding or hematoma present. 4x4 gauze dressing with opsite applied. Bleeding precautions and post activity explained; patient verbalized understanding.Cindee Salt

## 2021-05-11 NOTE — Progress Notes (Signed)
PHARMACY - TOTAL PARENTERAL NUTRITION CONSULT NOTE   Indication:  Intolerance to po intake, concern for malabsorption  Patient Measurements: Height: 5\' 1"  (154.9 cm) Weight: 55 kg (121 lb 4.1 oz) IBW/kg (Calculated) : 47.8 TPN AdjBW (KG): 52.3 Body mass index is 22.91 kg/m. Usual Weight: 60-65 kg  Assessment:  70 yo F admitted for DVT, acute on chronic CHF with anasarca. Patient had a cardiac arrest 11/9 likely due to electrolyte abnormalities. Patient's condition acutely worsened overnight 11/9 with concern for volume overload, N/V, and epigastric pain. Undigested/whole medications in bowel movements concerning for malabsorption.  Glucose / Insulin: CBGs <180, sSSI (0u/24hr) Electrolytes:  Na 137, K 3.6 (S/p K x157mEq), Ca 7.6>> CoCa 9.3, Phos 2.7, Mg 1.7 (s/p Mg x4gm), Cl 96, Bicarb 36 Renal:  Scr 0.44 (stable), UOP 5900 ml/24hr>>on lasix gtt @8mg /hr Hepatic: 11/10 LFTS WNL, TG 61, tBili 0.6, LBM 11/9 Intake / Output; MIVF: limited po intake since Feb 2022 GI Imaging: 11/3 CT abdomen: Mild ascites, marked diffuse body wall subQ edema GI Surgeries / Procedures: Vitamins: 11/8 Copper 80 (WNL), Ceruloplasmin 16.9 (low), Vitamin B1 149.5 (WNL), Vitamin D 87.7 (WNL), Vitamin B6 4.5 (WNL), Zinc 46 (WNL)  Central access: PICC 3L placed 11/7 TPN start date: 11/9  Nutritional Goals: Goal TPN rate 65 ml/hr (109gm AA, 1863 kcal)  RD Assessment: Estimated Needs Total Energy Estimated Needs: 1800-2000 Total Protein Estimated Needs: 105-120 Total Fluid Estimated Needs: >1.8 L/day  Current Nutrition:  D/c Boost 2/2 respiratory issues Cardiac Diet:eating <25% of meals since admission 11/10- Unable to tolerate feeding tube placement/attempted placement of postpyloric feeding tube(only advanced up to the stomach), was removed subsequently. 11/10 States she has marked improvement in nausea, was able to tolerate cream cheese and muffin for breakfast today along with some peaches.   Plan:   Increase concentrated TPN to 65 ml/hr @1800  (goal rate 65 ml/hr, 109gm AA, 1863 kcal) Electrolytes in TPN: Adj Na 21mEq/L, Adj K 44mEq/L, Adj Ca 27mEq/L, Adj Mg 55mEq/L, and Adj Phos to 10 mmol/L. Adj maximize Cl -Scheduled K 40 mEq BID (while on lasix drip), Mg x2gm 11/11 Add standard MVI and trace elements to TPN Initiate Sensitive q6h SSI and adjust as needed. Consider discontinuing once at goal rate if CBGs remain <180 Monitor TPN labs on Mon/Thurs Plan to transition to enteral nutrition as soon as possible as tolerance of po intake improves Patient currently on high dose thiamine until 11/13 Vitamins were WNL, no additional replacement needed On Furosemide drip, monitor K closely  Thank you for allowing pharmacy to be a part of this patient's care.  Donnald Garre, PharmD Clinical Pharmacist  Please check AMION for all [place] numbers After 10:00 PM, call [place] [phone]

## 2021-05-11 NOTE — Progress Notes (Signed)
55fr sheath was removed from right femoral vein.  Manual pressure applied x 10 min. No bleeding or hematoma present. 4x4 gauze dressing with opsite applied. Bleeding precautions and post activity explained; patient verbalized understanding. Report given to [facility] RN. Patient transported to [place] via bed.Debbie Barry

## 2021-05-11 NOTE — Progress Notes (Addendum)
ANTICOAGULATION CONSULT NOTE - Follow Up Consult  Pharmacy Consult for Heparin Indication: DVT  No Known Allergies  Patient Measurements: Height: 5\' 1"  (154.9 cm) Weight: 55 kg (121 lb 4.1 oz) IBW/kg (Calculated) : 47.8 Heparin Dosing Weight: 61.5 kg  Vital Signs: Temp: 98 F (36.7 C) (11/11 1213) Temp Source: Oral (11/11 1213) BP: 109/65 (11/11 1300) Pulse Rate: 92 (11/11 1300)  Labs: Recent Labs    05/09/21 0635 05/09/21 1725 05/10/21 0440 05/10/21 0914 05/10/21 1800 05/10/21 2117 05/11/21 0317 05/11/21 1007 05/11/21 1012  HGB 9.6*  --  8.3*  --   --   --  8.9* 9.5* 9.9*  9.9*  HCT 27.7*  --  23.6*  --   --   --  24.6* 28.0* 29.0*  29.0*  PLT 273  --  209  --   --   --  224  --   --   APTT  --    < > >200* 138*  --  38* 70*  --   --   HEPARINUNFRC  --   --  >1.10*  --   --   --  >1.10*  --   --   CREATININE 0.40*   < > 0.36*  --  0.49  --  0.44  --   --    < > = values in this interval not displayed.     Estimated Creatinine Clearance: 51.5 mL/min (by C-G formula based on SCr of 0.44 mg/dL).   Medications:  Scheduled:   carvedilol  3.125 mg Oral BID WC   Chlorhexidine Gluconate Cloth  6 each Topical Daily   cholestyramine light  4 g Oral 2 times per day on Mon Tue Wed Thu Fri Sat   insulin aspart  0-9 Units Subcutaneous Q6H   losartan  25 mg Oral Daily   mouth rinse  15 mL Mouth Rinse BID   megestrol  400 mg Oral BID   phosphorus  500 mg Oral Q4H   potassium chloride  40 mEq Oral BID   sodium chloride flush  10-40 mL Intracatheter Q12H   sodium chloride flush  3 mL Intravenous Q12H   sodium chloride flush  3 mL Intravenous Q12H   sodium chloride flush  3 mL Intravenous Q12H   spironolactone  25 mg Oral Daily   sucralfate  1 g Oral TID WC & HS   Infusions:   sodium chloride 10 mL/hr at 05/11/21 0802   sodium chloride     sodium chloride 30 mL/hr at 05/11/21 1333   sodium chloride     furosemide (LASIX) 200 mg in dextrose 5% 100 mL (2mg /mL)  infusion 8 mg/hr (05/11/21 0808)   heparin Stopped (05/11/21 0842)   thiamine injection Stopped (05/10/21 2314)   TPN ADULT (ION) 45 mL/hr at 05/11/21 0800   TPN ADULT (ION)      Assessment: 70 yo female with DVT and renal infarct on apixaban (Last dose of apixaban 10 mg given 11/9 at ~10am). She is noted s/p cardiac arrest 11/7 and and EF 25-30%.  She is on TPN for malabsorption and also changing to IV heparin.   She is now s/p cath with non-obstructive CAD. Heparin to restart 8 hours after sheath removal ~ 10:30am. Using aPTTs for heparin monitoring. Elevated heparin level with recent apixaban  Goal of Therapy:  Heparin level 0.3-0.7 units/ml aPTT 66-102 seconds Monitor platelets by anticoagulation protocol: Yes   Plan:  -Restart heparin 750 units/hr at 7pm -Daily heparin level, aPTT  and CBC  Hildred Laser, PharmD Clinical Pharmacist **Pharmacist phone directory can now be found on [email or web address] ([email or web address]).  Listed under [place].

## 2021-05-11 NOTE — Progress Notes (Signed)
Physical Therapy Treatment Patient Details Name: Debbie Barry MRN: [id number] DOB: 27-Nov-1950 Today's Date: 05/11/2021   History of Present Illness Pt adm 11/2 with chest pain and found to have lt common iliac DVT. Pt also found to have acute rt renal infarct, acute on chronic diastolic heart failure and large bilateral pleural effusions. On 11/7 pt suffered a vfib/vtach arrest and received CPR and shock.  PMH - spinal stenosis, chronic back pain, obesity, chf, htn, pvd, pt with recent hospital admission with BLE weakness and found to have chronic L3-4 compression fx and severe L4-5 canal stenosis.    PT Comments    The pt was received OOB in recliner, demos improved spirits and motivation for mobility this morning. The pt was able to progress mobility from sit-stand and pivoting steps to 47ft ambulation with RW within the room. She continues to benefit from assist of 2 to safely power up from recliner as well as to manage gait due to poor stability and rapid fatigue. HR to max 165bpm with ambulation, recovered to 120s rapidly with seated rest. The pt will continue to benefit from skilled PT to progress functional strength, power, and activity tolerance for mobility to allow for safe d/c to pt's desired d/c of return home with family assist.     Recommendations for follow up therapy are one component of a multi-disciplinary discharge planning process, led by the attending physician.  Recommendations may be updated based on patient status, additional functional criteria and insurance authorization.  Follow Up Recommendations  Home health PT (pt remains adamant she does not want to return to SNF, but agrees she currently needs more assist than her spouse can safely provide)     Assistance Recommended at Discharge Frequent or constant Supervision/Assistance  Equipment Recommendations  Other (comment) (lift, pt reports WC has already been delivered to home)    Recommendations for Other  Services       Precautions / Restrictions Precautions Precautions: Fall;Other (comment) Precaution Comments: monitor HR Required Braces or Orthoses: Spinal Brace Spinal Brace: Lumbar corset Other Brace: Pt had a brace from last admission but not present in the room. Stated she used some when up. Restrictions Weight Bearing Restrictions: No     Mobility  Bed Mobility Overal bed mobility: Needs Assistance Bed Mobility: Rolling;Sit to Sidelying Rolling: Min assist       Sit to sidelying: Mod assist;+2 for safety/equipment General bed mobility comments: modA to return BLE into bed, minA to complete movement    Transfers Overall transfer level: Needs assistance Equipment used: Rolling walker (2 wheels) Transfers: Sit to/from Stand Sit to Stand: Mod assist;+2 physical assistance           General transfer comment: modA to complete, HR elevation to 130 just prior to mobility, then 145bpm with stand. `    Ambulation/Gait Ambulation/Gait assistance: Min assist;+2 physical assistance Gait Distance (Feet): 5 Feet Assistive device: Rolling walker (2 wheels) Gait Pattern/deviations: Step-to pattern;Decreased stride length;Trunk flexed Gait velocity: decreased Gait velocity interpretation: <1.31 ft/sec, indicative of household ambulator   General Gait Details: pt with short strides and minimal clearance. mulitple standing rest breaks with trunk flexed. HR to 165 bpm     Balance Overall balance assessment: Needs assistance Sitting-balance support: No upper extremity supported;Feet unsupported Sitting balance-Leahy Scale: Fair Sitting balance - Comments: pt fatigues quickly, sitting with trunk flexion   Standing balance support: Bilateral upper extremity supported;During functional activity Standing balance-Leahy Scale: Poor Standing balance comment: dependent on BUE support through [facility]  Cognition Arousal/Alertness:  Awake/alert Behavior During Therapy: WFL for tasks assessed/performed Overall Cognitive Status: Within Functional Limits for tasks assessed                                 General Comments: pt benefits from encouragement, able to follow cues with increased time.        Exercises      General Comments General comments (skin integrity, edema, etc.): HR to 130 in anticipation of standing, 145-165bpm with 5 ft gait back to bed      Pertinent Vitals/Pain Pain Assessment: Faces Faces Pain Scale: Hurts a little bit Pain Location: back sore Pain Descriptors / Indicators: Grimacing Pain Intervention(s): Limited activity within patient's tolerance;Monitored during session;Repositioned;Patient requesting pain meds-RN notified     PT Goals (current goals can now be found in the care plan section) Acute Rehab PT Goals Patient Stated Goal: go home PT Goal Formulation: With patient Time For Goal Achievement: 05/18/21 Potential to Achieve Goals: Fair Progress towards PT goals: Progressing toward goals    Frequency    Min 3X/week      PT Plan Current plan remains appropriate;Frequency needs to be updated (if family able to provide assist for transfers and all mobility)       AM-PAC PT "6 Clicks" Mobility   Outcome Measure  Help needed turning from your back to your side while in a flat bed without using bedrails?: A Little Help needed moving from lying on your back to sitting on the side of a flat bed without using bedrails?: A Lot Help needed moving to and from a bed to a chair (including a wheelchair)?: Total Help needed standing up from a chair using your arms (e.g., wheelchair or bedside chair)?: Total Help needed to walk in hospital room?: Total Help needed climbing 3-5 steps with a railing? : Total 6 Click Score: 9    End of Session Equipment Utilized During Treatment: Gait belt;Oxygen Activity Tolerance: Patient limited by fatigue Patient left: with call  bell/phone within reach;in chair Nurse Communication: Mobility status PT Visit Diagnosis: Difficulty in walking, not elsewhere classified (R26.2);Muscle weakness (generalized) (M62.81);History of falling (Z91.81)     Time: 5465-0354 PT Time Calculation (min) (ACUTE ONLY): 19 min  Charges:  $Gait Training: 8-22 mins                     [place] Carbo, PT, DPT   Acute Rehabilitation Department Pager #: [phone]   Sandra Cockayne 05/11/2021, 9:16 AM

## 2021-05-12 DIAGNOSIS — I428 Other cardiomyopathies: Secondary | ICD-10-CM

## 2021-05-12 DIAGNOSIS — R601 Generalized edema: Secondary | ICD-10-CM | POA: Diagnosis not present

## 2021-05-12 LAB — BASIC METABOLIC PANEL
Anion gap: 5 (ref 5–15)
Anion gap: 6 (ref 5–15)
BUN: 9 mg/dL (ref 8–23)
BUN: 10 mg/dL (ref 8–23)
CO2: 34 mmol/L — ABNORMAL HIGH (ref 22–32)
CO2: 32 mmol/L (ref 22–32)
Calcium: 7.6 mg/dL — ABNORMAL LOW (ref 8.9–10.3)
Calcium: 7.7 mg/dL — ABNORMAL LOW (ref 8.9–10.3)
Chloride: 95 mmol/L — ABNORMAL LOW (ref 98–111)
Chloride: 96 mmol/L — ABNORMAL LOW (ref 98–111)
Creatinine, Ser: 0.45 mg/dL (ref 0.44–1.00)
Creatinine, Ser: 0.42 mg/dL — ABNORMAL LOW (ref 0.44–1.00)
GFR, Estimated: >60 mL/min (ref >60)
GFR, Estimated: >60 mL/min (ref >60)
Glucose, Bld: 111 mg/dL — ABNORMAL HIGH (ref 70–99)
Glucose, Bld: 108 mg/dL — ABNORMAL HIGH (ref 70–99)
Potassium: 3.5 mmol/L (ref 3.5–5.1)
Potassium: 3.6 mmol/L (ref 3.5–5.1)
Sodium: 134 mmol/L — ABNORMAL LOW (ref 135–145)
Sodium: 134 mmol/L — ABNORMAL LOW (ref 135–145)

## 2021-05-12 LAB — CBC
HCT: 22.9 % — ABNORMAL LOW (ref 36.0–46.0)
Hemoglobin: 8.1 g/dL — ABNORMAL LOW (ref 12.0–15.0)
MCH: 33.3 pg (ref 26.0–34.0)
MCHC: 35.4 g/dL (ref 30.0–36.0)
MCV: 94.2 fL (ref 80.0–100.0)
Platelets: 200 10*3/uL (ref 150–400)
RBC: 2.43 MIL/uL — ABNORMAL LOW (ref 3.87–5.11)
RDW: 15.9 % — ABNORMAL HIGH (ref 11.5–15.5)
WBC: 7.7 10*3/uL (ref 4.0–10.5)
nRBC: 0 % (ref 0.0–0.2)

## 2021-05-12 LAB — VITAMIN E
Vitamin E (Alpha Tocopherol): 9.9 mg/L (ref 9.0–29.0)
Vitamin E(Gamma Tocopherol): 0.7 mg/L (ref 0.5–4.9)

## 2021-05-12 LAB — GLUCOSE, CAPILLARY
Glucose-Capillary: 124 mg/dL — ABNORMAL HIGH (ref 70–99)
Glucose-Capillary: 134 mg/dL — ABNORMAL HIGH (ref 70–99)

## 2021-05-12 LAB — APTT: aPTT: 85 seconds — ABNORMAL HIGH (ref 24–36)

## 2021-05-12 LAB — PHOSPHORUS: Phosphorus: 2.7 mg/dL (ref 2.5–4.6)

## 2021-05-12 LAB — HEPARIN LEVEL (UNFRACTIONATED): Heparin Unfractionated: 0.69 IU/mL (ref 0.30–0.70)

## 2021-05-12 LAB — MAGNESIUM
Magnesium: 1.6 mg/dL — ABNORMAL LOW (ref 1.7–2.4)
Magnesium: 2.1 mg/dL (ref 1.7–2.4)

## 2021-05-12 LAB — VITAMIN A: Vitamin A (Retinoic Acid): 14 ug/dL — ABNORMAL LOW (ref 22.0–69.5)

## 2021-05-12 MED ORDER — TRACE MINERALS CU-MN-SE-ZN 300-55-60-3000 MCG/ML IV SOLN
INTRAVENOUS | Status: AC
  Filled 2021-05-12: qty 504

## 2021-05-12 MED ORDER — CARVEDILOL 6.25 MG PO TABS
6.2500 mg | ORAL_TABLET | Freq: Two times a day (BID) | ORAL | Status: DC
  Administered 2021-05-12 – 2021-05-14 (×5): 6.25 mg via ORAL
  Filled 2021-05-12 (×5): qty 1

## 2021-05-12 MED ORDER — FUROSEMIDE 10 MG/ML IJ SOLN
80.0000 mg | Freq: Two times a day (BID) | INTRAMUSCULAR | Status: DC
  Administered 2021-05-12 – 2021-05-16 (×10): 80 mg via INTRAVENOUS
  Filled 2021-05-12 (×10): qty 8

## 2021-05-12 MED ORDER — DOCUSATE SODIUM 50 MG PO CAPS
50.0000 mg | ORAL_CAPSULE | Freq: Every day | ORAL | Status: DC | PRN
  Administered 2021-05-12 – 2021-05-13 (×2): 50 mg via ORAL
  Filled 2021-05-12 (×3): qty 1

## 2021-05-12 MED ORDER — HYDROXYZINE HCL 10 MG PO TABS
10.0000 mg | ORAL_TABLET | Freq: Four times a day (QID) | ORAL | Status: DC | PRN
  Administered 2021-05-12 – 2021-05-22 (×23): 10 mg via ORAL
  Filled 2021-05-12 (×23): qty 1

## 2021-05-12 MED ORDER — POTASSIUM CHLORIDE 10 MEQ/50ML IV SOLN
10.0000 meq | INTRAVENOUS | Status: AC
  Administered 2021-05-12 (×2): 10 meq via INTRAVENOUS
  Filled 2021-05-12: qty 50

## 2021-05-12 MED ORDER — MAGNESIUM SULFATE 4 GM/100ML IV SOLN
4.0000 g | Freq: Once | INTRAVENOUS | Status: AC
  Administered 2021-05-12: 4 g via INTRAVENOUS
  Filled 2021-05-12: qty 100

## 2021-05-12 NOTE — Progress Notes (Signed)
ANTICOAGULATION CONSULT NOTE - Follow Up Consult  Pharmacy Consult for Heparin Indication: DVT  No Known Allergies  Patient Measurements: Height: 5\' 1"  (154.9 cm) Weight: 55 kg (121 lb 4.1 oz) IBW/kg (Calculated) : 47.8 Heparin Dosing Weight: 61.5 kg  Vital Signs: Temp: 97.5 F (36.4 C) (11/12 0400) Temp Source: Oral (11/12 0400) BP: 112/60 (11/12 0400) Pulse Rate: 100 (11/12 0400)  Labs: Recent Labs    05/10/21 0440 05/10/21 0914 05/10/21 2117 05/11/21 0317 05/11/21 1007 05/11/21 1012 05/11/21 1849 05/12/21 0400  HGB 8.3*  --   --  8.9* 9.5* 9.9*  9.9*  --  8.1*  HCT 23.6*  --   --  24.6* 28.0* 29.0*  29.0*  --  22.9*  PLT 209  --   --  224  --   --   --  200  APTT >200*   < > 38* 70*  --   --   --  85*  HEPARINUNFRC >1.10*  --   --  >1.10*  --   --   --  0.69  CREATININE 0.36*   < >  --  0.44  --   --  0.46 0.45   < > = values in this interval not displayed.     Estimated Creatinine Clearance: 51.5 mL/min (by C-G formula based on SCr of 0.45 mg/dL).   Medications:  Scheduled:   carvedilol  3.125 mg Oral BID WC   Chlorhexidine Gluconate Cloth  6 each Topical Daily   cholestyramine light  2 g Oral Daily   insulin aspart  0-9 Units Subcutaneous Q6H   losartan  25 mg Oral Daily   mouth rinse  15 mL Mouth Rinse BID   megestrol  400 mg Oral BID   potassium chloride  40 mEq Oral BID   sodium chloride flush  10-40 mL Intracatheter Q12H   sodium chloride flush  3 mL Intravenous Q12H   sodium chloride flush  3 mL Intravenous Q12H   sodium chloride flush  3 mL Intravenous Q12H   spironolactone  25 mg Oral Daily   sucralfate  1 g Oral BID   Infusions:   sodium chloride 10 mL/hr at 05/11/21 0802   sodium chloride     sodium chloride 10 mL/hr at 05/11/21 2100   furosemide (LASIX) 200 mg in dextrose 5% 100 mL (2mg /mL) infusion 8 mg/hr (05/12/21 0412)   heparin 750 Units/hr (05/12/21 0400)   thiamine injection Stopped (05/11/21 2136)   TPN ADULT (ION) 65 mL/hr  at 05/12/21 0400    Assessment: 70 yo female with DVT and renal infarct on apixaban (Last dose of apixaban 10 mg given 11/9 at ~10am). She is noted s/p cardiac arrest 11/7 and and EF 25-30%.  She is on TPN for malabsorption and also changing to IV heparin.  She is now s/p cath with non-obstructive CAD. Heparin to restart 8 hours after sheath removal ~ 10:30am. Using aPTTs for heparin monitoring. Elevated heparin level with recent apixaban  AM aPTT therapeutic at 85 seconds, and anti-Xa level starting to downtrend getting closer to correlating with aPTT  Goal of Therapy:  Heparin level 0.3-0.7 units/ml aPTT 66-102 seconds Monitor platelets by anticoagulation protocol: Yes   Plan:  Continue heparin gtt at 750 units/hr Daily aPTT/HL, CBC, s/s bleeding  Bertis Ruddy, PharmD Clinical Pharmacist ED Pharmacist Phone # [phone] 05/12/2021 5:17 AM

## 2021-05-12 NOTE — Progress Notes (Signed)
Progress Note  Patient Name: Debbie Barry Date of Encounter: 05/12/2021  Primary Cardiologist: Debbie Field, MD  Subjective   Feels constipated, no abdominal pain or nausea.  No chest pain or shortness of breath at rest.  Inpatient Medications    Scheduled Meds:  carvedilol  3.125 mg Oral BID WC   Chlorhexidine Gluconate Cloth  6 each Topical Daily   cholestyramine light  2 g Oral Daily   insulin aspart  0-9 Units Subcutaneous Q6H   losartan  25 mg Oral Daily   mouth rinse  15 mL Mouth Rinse BID   megestrol  400 mg Oral BID   potassium chloride  40 mEq Oral BID   sodium chloride flush  10-40 mL Intracatheter Q12H   sodium chloride flush  3 mL Intravenous Q12H   spironolactone  25 mg Oral Daily   sucralfate  1 g Oral BID   Continuous Infusions:  sodium chloride 10 mL/hr at 05/11/21 2100   furosemide (LASIX) 200 mg in dextrose 5% 100 mL (2mg /mL) infusion 8 mg/hr (05/12/21 0600)   heparin 750 Units/hr (05/12/21 0600)   magnesium sulfate bolus IVPB 4 g (05/12/21 0628)   potassium chloride 10 mEq (05/12/21 0628)   thiamine injection Stopped (05/11/21 2136)   TPN ADULT (ION) 65 mL/hr at 05/12/21 0600   PRN Meds: sodium chloride, acetaminophen, HYDROcodone-acetaminophen, hydrocortisone cream, melatonin, sodium chloride flush   Vital Signs    Vitals:   05/12/21 0500 05/12/21 0600 05/12/21 0700 05/12/21 0800  BP: 110/61 (!) 104/52 (!) 146/71   Pulse: (!) 101 (!) 103 (!) 115 (!) 109  Resp: 12 14 (!) 26 15  Temp:   99 F (37.2 C)   TempSrc:   Oral   SpO2: 94% 95% 94% 95%  Weight: 55 kg     Height:        Intake/Output Summary (Last 24 hours) at 05/12/2021 0801 Last data filed at 05/12/2021 0640 Gross per 24 hour  Intake 2226.66 ml  Output 3150 ml  Net -923.34 ml   Filed Weights   05/10/21 0442 05/11/21 0500 05/12/21 0500  Weight: 58.6 kg 55 kg 55 kg    Telemetry    Sinus rhythm and sinus tachycardia with occasional PACs and brief burst of NSVT.   Personally reviewed.  ECG    No ECG reviewed.  Physical Exam   GEN: No acute distress.   Neck: No JVD. Cardiac: RRR, no murmur or gallop.  Respiratory: Nonlabored.  Decreased breath sounds at the bases. GI: Soft, nontender, bowel sounds diminished. MS: Trace lower leg edema; No deformity. Neuro:  Nonfocal. Psych: Alert and oriented x 3. Normal affect.  Labs    Chemistry Recent Labs  Lab 05/07/21 0855 05/07/21 1825 05/07/21 2343 05/08/21 0520 05/10/21 0440 05/10/21 1800 05/11/21 0317 05/11/21 1007 05/11/21 1012 05/11/21 1849 05/12/21 0400  NA 138   < > 134*   < > 134*   < > 137   < > 135  135 134* 134*  K 2.6*   < > 2.5*   < > 3.1*   < > 3.6   < > 3.2*  3.3* 3.2* 3.5  CL 108   < > 99   < > 96*   < > 96*  --   --  94* 95*  CO2 24   < > 29   < > 30   < > 36*  --   --  35* 34*  GLUCOSE 74   < >  153*   < > 103*   < > 106*  --   --  134* 111*  BUN <5*   < > <5*   < > <5*   < > 5*  --   --  6* 9  CREATININE 0.38*   < > 0.48   < > 0.36*   < > 0.44  --   --  0.46 0.45  CALCIUM 5.5*   < > 7.4*   < > 7.9*   < > 7.6*  --   --  7.6* 7.6*  PROT 3.4*  --  4.0*  --  4.3*  --   --   --   --   --   --   ALBUMIN <1.5*  --  1.7*  --  1.9*  --   --   --   --   --   --   AST 50*  --  29  --  22  --   --   --   --   --   --   ALT 11  --  16  --  14  --   --   --   --   --   --   ALKPHOS 89  --  96  --  92  --   --   --   --   --   --   BILITOT 0.6  --  0.8  --  0.6  --   --   --   --   --   --   GFRNONAA >60   < > >60   < > >60   < > >60  --   --  >60 >60  ANIONGAP 6   < > 6   < > 8   < > 5  --   --  5 5   < > = values in this interval not displayed.     Hematology Recent Labs  Lab 05/10/21 0440 05/11/21 0317 05/11/21 1007 05/11/21 1012 05/12/21 0400  WBC 6.3 7.0  --   --  7.7  RBC 2.50* 2.63*  --   --  2.43*  HGB 8.3* 8.9* 9.5* 9.9*  9.9* 8.1*  HCT 23.6* 24.6* 28.0* 29.0*  29.0* 22.9*  MCV 94.4 93.5  --   --  94.2  MCH 33.2 33.8  --   --  33.3  MCHC 35.2 36.2*  --   --   35.4  RDW 16.0* 15.9*  --   --  15.9*  PLT 209 224  --   --  200    Cardiac Enzymes Recent Labs  Lab 05/02/21 1945 05/03/21 0304  TROPONINIHS 28* 32*    BNP Recent Labs  Lab 05/07/21 0900  BNP 482.9*     Radiology    CARDIAC CATHETERIZATION  Result Date: 05/11/2021   Prox LAD lesion is 20% stenosed.   Ost LAD to Prox LAD lesion is 20% stenosed. No  obstructive CAD with minimal coronary calcification at the ostium and proximal LAD with 20% narrowing.  Otherwise normal coronary arteries. Normal right heart pressures Findings compatible with a nonischemic cardiomyopathy. Significant calcification in the femoral arteries right greater than left with significant luminal calcification in the right femoral artery. RECOMMENDATION: Guideline directed medical therapy for severe LV dysfunction.  Avoidance of QT prolongation medications.  Patient has significant claudication symptomatology.   VAS [place] UPPER EXTREMITY VENOUS DUPLEX  Result Date: 05/10/2021 UPPER VENOUS STUDY  Patient Name:  Debbie Barry  Date of Exam:   05/09/2021 Medical Rec #: [id number]           Accession #:    [id number] Date of Birth: 10-03-1950          Patient Gender: F Patient Age:   10 years Exam Location:  [facility] Procedure:      VAS [place] UPPER EXTREMITY VENOUS DUPLEX Referring Phys: Trevor Mace --------------------------------------------------------------------------------  Indications: Edema Comparison Study: No previous exams Performing Technologist: Rogelia Rohrer RVT, RDMS Supporting Technologist: RVT, RDMS  Examination Guidelines: A complete evaluation includes B-mode imaging, spectral Doppler, color Doppler, and power Doppler as needed of all accessible portions of each vessel. Bilateral testing is considered an integral part of a complete examination. Limited examinations for reoccurring indications may be performed as noted.  Right Findings:  +----------+------------+---------+-----------+----------+-------+ RIGHT     CompressiblePhasicitySpontaneousPropertiesSummary +----------+------------+---------+-----------+----------+-------+ Subclavian    Full       Yes       Yes                      +----------+------------+---------+-----------+----------+-------+  Left Findings: +----------+------------+---------+-----------+----------+-------+ LEFT      CompressiblePhasicitySpontaneousPropertiesSummary +----------+------------+---------+-----------+----------+-------+ IJV           Full       Yes       Yes                      +----------+------------+---------+-----------+----------+-------+ Subclavian    Full       Yes       Yes                      +----------+------------+---------+-----------+----------+-------+ Axillary      Full       Yes       Yes                      +----------+------------+---------+-----------+----------+-------+ Brachial      Full       Yes       Yes                      +----------+------------+---------+-----------+----------+-------+ Radial        Full                                          +----------+------------+---------+-----------+----------+-------+ Ulnar         Full                                          +----------+------------+---------+-----------+----------+-------+ Cephalic      Full                                          +----------+------------+---------+-----------+----------+-------+ Basilic       Full       Yes       Yes                      +----------+------------+---------+-----------+----------+-------+  Summary:  Right: No evidence of thrombosis in the subclavian.  Left: No evidence of deep vein thrombosis in the upper  extremity. No evidence of superficial vein thrombosis in the upper extremity.  *See table(s) above for measurements and observations.  Diagnosing physician: Monica Martinez MD Electronically signed by  Monica Martinez MD on 05/10/2021 at 3:32:10 PM.    Final     Cardiac Studies   Echocardiogram 05/09/2021:  1. Left ventricular ejection fraction, by estimation, is 25 to 30%. The  left ventricle has severely decreased function. The left ventricle  demonstrates global hypokinesis. Left ventricular diastolic parameters are  consistent with Grade I diastolic  dysfunction (impaired relaxation).   2. Right ventricular systolic function is moderately reduced. The right  ventricular size is mildly enlarged. Tricuspid regurgitation signal is  inadequate for assessing PA pressure.   3. The mitral valve is normal in structure. Mild mitral valve  regurgitation. No evidence of mitral stenosis.   4. The aortic valve is tricuspid. Aortic valve regurgitation is not  visualized. Mild aortic valve sclerosis is present, with no evidence of  aortic valve stenosis.   5. The inferior vena cava is dilated in size with <50% respiratory  variability, suggesting right atrial pressure of 15 mmHg.   6. There is a left pleural effusion.   7. Limited echo, biventricular dysfunction.  Assessment & Plan    1.  Nonischemic cardiomyopathy with biventricular heart failure, LVEF 25 to 30%, global hypokinesis, also moderate RV dysfunction.  Cardiac catheterization on November 11 showed mild coronary atherosclerosis and normal right heart pressures, mean wedge pressure of 7 and cardiac index 3.3.  Currently on Coreg, losartan, Aldactone, and Lasix infusion with potassium supplement.  2.  S/p cardiac arrest due to polymorphic VT/torsades in the setting of QT prolongation.    3.  Volume overload with anasarca. Net urine output of approximately 900 cc last 24 hours, 3100 cc 24 hours prior to that.  Weight is coming down gradually.  Renal function stable with creatinine 0.45.  Potassium 3.5 and magnesium 1.6.  4.  Severe protein deficiency and hypoalbuminemia.  GI consultation reviewed.  5.  Right lower extremity DVT and  acute renal infarct, currently on heparin.  Chart reviewed.  Discussed with nursing.  Electrolytes are being repleted by primary team, aim for potassium 4.0 and magnesium 2.0.  Plan to transition from Lasix infusion to divided dose Lasix 80 mg IV twice daily.  Increase Coreg to 6.25 mg twice daily.  Continue losartan and Aldactone for now.  Repeat ECG.  Signed, Rozann Lesches, MD  05/12/2021, 8:01 AM

## 2021-05-12 NOTE — Progress Notes (Signed)
[facility] ADULT ICU REPLACEMENT PROTOCOL   The patient does apply for the [facility] Adult ICU Electrolyte Replacment Protocol based on the criteria listed below:   1.Exclusion criteria: TCTS patients, ECMO patients, and Dialysis patients 2. Is GFR >/= 30 ml/min? Yes.    Patient's GFR today is >60 3. Is SCr </= 2? Yes.   Patient's SCr is 0.45 mg/dL 4. Did SCr increase >/= 0.5 in 24 hours? No. 5.Pt's weight >40kg  Yes.   6. Abnormal electrolyte(s):  K 3.5, Mg 1.6  7. Electrolytes replaced per protocol 8.  Call MD STAT for K+ </= 2.5, Phos </= 1, or Mag </= 1 Physician:  Lowella Bandy R Riyanna Crutchley 05/12/2021 6:01 AM

## 2021-05-12 NOTE — Progress Notes (Signed)
[facility] Progress Note Patient Name: Debbie Barry DOB: 25-Mar-1951 MRN: [id number]   Date of Service  05/12/2021  HPI/Events of Note  K+ 3.5  eICU Interventions  KCL 10 meq iv Q 1 hour x 2 ordered.        [name] [name] 05/12/2021, 6:04 AM

## 2021-05-12 NOTE — Plan of Care (Signed)
  Problem: Health Behavior/Discharge Planning: Goal: Ability to manage health-related needs will improve Outcome: Progressing   Problem: Clinical Measurements: Goal: Will remain free from infection Outcome: Progressing Goal: Respiratory complications will improve Outcome: Progressing Goal: Cardiovascular complication will be avoided Outcome: Progressing   Problem: Activity: Goal: Risk for activity intolerance will decrease Outcome: Progressing

## 2021-05-12 NOTE — Progress Notes (Signed)
ANTICOAGULATION CONSULT NOTE - Follow Up Consult  Pharmacy Consult for Heparin Indication: DVT  No Known Allergies  Patient Measurements: Height: 5\' 1"  (154.9 cm) Weight: 55 kg (121 lb 4.1 oz) IBW/kg (Calculated) : 47.8 Heparin Dosing Weight: 61.5 kg  Vital Signs: Temp: 99 F (37.2 C) (11/12 0700) Temp Source: Oral (11/12 0700) BP: 143/95 (11/12 1000) Pulse Rate: 107 (11/12 1000)  Labs: Recent Labs    05/10/21 0440 05/10/21 0914 05/10/21 2117 05/11/21 0317 05/11/21 1007 05/11/21 1012 05/11/21 1849 05/12/21 0400  HGB 8.3*  --   --  8.9* 9.5* 9.9*  9.9*  --  8.1*  HCT 23.6*  --   --  24.6* 28.0* 29.0*  29.0*  --  22.9*  PLT 209  --   --  224  --   --   --  200  APTT >200*   < > 38* 70*  --   --   --  85*  HEPARINUNFRC >1.10*  --   --  >1.10*  --   --   --  0.69  CREATININE 0.36*   < >  --  0.44  --   --  0.46 0.45   < > = values in this interval not displayed.     Estimated Creatinine Clearance: 51.5 mL/min (by C-G formula based on SCr of 0.45 mg/dL).   Medications:  Scheduled:   carvedilol  6.25 mg Oral BID WC   Chlorhexidine Gluconate Cloth  6 each Topical Daily   furosemide  80 mg Intravenous Q12H   insulin aspart  0-9 Units Subcutaneous Q6H   losartan  25 mg Oral Daily   mouth rinse  15 mL Mouth Rinse BID   megestrol  400 mg Oral BID   potassium chloride  40 mEq Oral BID   sodium chloride flush  10-40 mL Intracatheter Q12H   sodium chloride flush  3 mL Intravenous Q12H   spironolactone  25 mg Oral Daily   sucralfate  1 g Oral BID   Infusions:   sodium chloride 10 mL/hr at 05/11/21 2100   heparin 750 Units/hr (05/12/21 0600)   thiamine injection 250 mg (05/12/21 0951)   TPN ADULT (ION) 65 mL/hr at 05/12/21 0600    Assessment: 70 yo female with DVT and renal infarct on apixaban (Last dose of apixaban 10 mg given 11/9 at ~10am). She is noted s/p cardiac arrest 11/7 and and EF 25-30%.  She is on TPN for malabsorption and also changing to IV heparin.   She is now s/p cath with non-obstructive CAD. Heparin restarted post-cath. Using aPTTs for heparin monitoring. Elevated heparin level with recent apixaban.  AM aPTT therapeutic at 85 seconds, and anti-Xa level starting to downtrend getting closer to correlating with aPTT  Goal of Therapy:  Heparin level 0.3-0.7 units/ml aPTT 66-102 seconds Monitor platelets by anticoagulation protocol: Yes   Plan:  Continue heparin gtt at 750 units/hr Daily aPTT/HL, CBC, s/s bleeding  [place] Crane, Roylene Reason, [facility] Clinical Pharmacist  05/12/2021 11:11 AM   [facility] pharmacy phone numbers are listed on [email or web address]

## 2021-05-12 NOTE — Progress Notes (Signed)
[place] 2:06 PM  Subjective: Patient seen and examined in her hospital computer chart in our office computer chart was reviewed and her case discussed with Dr. Therisa Doyne and she does not have any signs of bleeding and she was having diarrhea and episodic vomiting at home but now she seems to be constipated and she says she is not eating because her belly hurts she has no new complaints  Objective: Signs stable afebrile no acute distress abdomen is soft nontender BUN and creatinine okay White count okay hemoglobin slight drop recent CT okay  Assessment: Multiple medical problems including anemia currently with constipation and malnutrition  Plan: Continue present management and care per pulmonary and cardiac teams and please let me know if I can be of any further assistance this weekend otherwise we will check on tomorrow but probably would benefit from a pump inhibitor or at least Pepcid twice a day with history of ulcers in the past  [facility]  office [phone] After 5PM or if no answer call [phone]

## 2021-05-12 NOTE — Progress Notes (Signed)
PHARMACY - TOTAL PARENTERAL NUTRITION CONSULT NOTE   Indication:  Intolerance to po intake, concern for malabsorption  Patient Measurements: Height: 5\' 1"  (154.9 cm) Weight: 55 kg (121 lb 4.1 oz) IBW/kg (Calculated) : 47.8 TPN AdjBW (KG): 52.3 Body mass index is 22.91 kg/m. Usual Weight: 60-65 kg  Assessment:  70 yo F admitted for DVT, acute on chronic CHF with anasarca. Patient had a cardiac arrest 11/9 likely due to electrolyte abnormalities. Patient's condition acutely worsened overnight 11/9 with concern for volume overload, N/V, and epigastric pain. Undigested/whole medications in bowel movements concerning for malabsorption. Noted limited po intake since Feb 2022.  Glucose / Insulin: no hx DM. CBGs <180, utilized 3 units sSSI in last 24hrs Electrolytes:  Na 134, K 3.5 (S/p K runs x 3 + KCl 15mEq PO x 2 doses yesterday), low Cl, high CO2, Phos 2.7 (s/p K Phos Neutral x 3 doses yesterday), Mg 1.6 (s/p mag sulfate 2g IV x 1 yesterday) Renal:  Scr 0.45 (stable), BUN WNL UOP 4150 ml/24hr. Lasix drip to Lasix 80mg  IV q12h on 11/12 (note - losartan/spiro not given 11/11) Hepatic: LFTs / Tbili / TG WNL, albumin 1.9 Intake / Output; MIVF: LBM 11/9 GI Imaging:11/3 CT abdomen: Mild ascites, marked diffuse body wall subQ edema GI Surgeries / Procedures: Vitamins: 11/8 Copper 80 (WNL), Ceruloplasmin 16.9 (low), Vitamin B1 149.5 (WNL), Vitamin D 87.7 (WNL), Vitamin B6 4.5 (WNL), Zinc 46 (WNL)  Central access: PICC placed 11/7 TPN start date: 11/9  Nutritional Goals: Goal TPN rate 65 ml/hr (109gm AA, 1863 kcal)  RD Assessment: Estimated Needs Total Energy Estimated Needs: 1800-2000 Total Protein Estimated Needs: 105-120 Total Fluid Estimated Needs: >1.8 L/day  Current Nutrition:  TPN Heart diet started 11/11 - appetite charted as "poor" with intake between 25-50% of meals 11/10- Unable to tolerate feeding tube placement/attempted placement of postpyloric feeding tube (only advanced up  to the stomach), was removed subsequently. 11/10 States she has marked improvement in nausea, was able to tolerate some PO   Plan:  Begin to wean concentrated TPN today per discussion with Dr. Valeta Harms (CCM) - reduce TPN to 45 ml/hr (~75% goal rate) at 1800. Hopeful to wean further tomorrow. Electrolytes in TPN: increase Na to 80 mEq/L, K to 50 mEq/L, Ca to 5 mEq/L, Mag to 15 mEq/L, and Phos to 12 mmol/L. Cl:Ac at max Cl Continue scheduled K 40 mEq BID while on scheduled Lasix IV. Already given K runs x 2 this AM per MD Mag sulfate 4g IV x 1 already given this AM per MD Add standard MVI and trace elements to TPN Continue Sensitive q6h SSI and adjust as needed - consider d/c if CBGs remain controlled x 48hrs on goal rate Patient on high dose thiamine until 11/13 Vitamin levels WNL - no additional replacement needed Monitor TPN labs, watch electrolytes closely F/u PO intake/tolerance and ability to wean TPN further tomorrow   Arturo Morton, PharmD, BCPS Please check AMION for all [place] contact numbers Clinical Pharmacist 05/12/2021 8:38 AM

## 2021-05-12 NOTE — Progress Notes (Signed)
NAME:  Carolin Quang, MRN:  [id number], DOB:  12/31/1950, LOS: 9 ADMISSION DATE:  05/02/2021, CONSULTATION DATE:  11/7 REFERRING MD:  Dr. Pietro Cassis, CHIEF COMPLAINT:  VT/VF   History of Present Illness:  70 year old female with past medical history as below, which is significant for hypertension, spinal stenosis, gastric ulcers, and fatty liver.  She was recently admitted due to lower extremity weakness and was found to have spinal stenosis for which medical management was recommended.  She was discharged to SNF where course was relatively uneventful.  She presented again to [facility] emergency department on 11/3 with complaints of chest pain and palpitations.  Initial work-up significant for DVT and left-sided pleural effusion.  She also had diffuse anasarca.  This was all felt to be in the context of poor nutrition and low albumin.  She was treated with diuretics.  On 11/7 in the AM hours she suffered ventricular fibrillation cardiac arrest and underwent 3 minutes of CPR with return of spontaneous circulation prior to defibrillation. Then a few hours later she went into pulsatile VT, which was treated with cardioversion x 1. She was transferred to the ICU after cardiology consultation. K and mag were found to be low and supplementation was arranged. PCCM was consulted.   Pertinent  Medical History   has a past medical history of Fatty liver, GERD (gastroesophageal reflux disease), Hypertension, Osteomyelitis ([place]), Peripheral vascular disease ([place]), and Ulcer (08/14/20).   Significant Hospital Events: Including procedures, antibiotic start and stop dates in addition to other pertinent events   11/3 admitted with CP and palpitations.  Thoracentesis left for 500cc. 11/7 Cardiac arrest 11/8 developed respiratory distress with ST in the afternoon requiring escalation to non-rebreather. 11/9 TPN started. GI consulted for malnutrition. Working dx Severe hypoalbuminemia, anasarca associated with  nausea, vomiting, diarrhea and weight loss suspected to be secondary to protein-losing enteropathy 11/10 still hypoxic but tolerating diet. Aldactone  increased =. ARB added.  11/11 right and left heart cath, severely depressed LV   Interim History / Subjective:  overall states that she is feeling better.  Able to keep some food down  Objective   Blood pressure (!) 143/95, pulse (!) 107, temperature 99 F (37.2 C), temperature source Oral, resp. rate 17, height 5\' 1"  (1.549 m), weight 55 kg, SpO2 93 %.        Intake/Output Summary (Last 24 hours) at 05/12/2021 1047 Last data filed at 05/12/2021 0900 Gross per 24 hour  Intake 2226.66 ml  Output 4150 ml  Net -1923.34 ml   Filed Weights   05/10/21 0442 05/11/21 0500 05/12/21 0500  Weight: 58.6 kg 55 kg 55 kg    Examination: General: please fm, resting in bed  HENT: NCAT, tracking  Lungs: crackles in base  Heart: RRR, s1 s2  abdomen: soft, nt nd  Extremities: Dependent edema  Neuro: alert following commands    Resolved Hospital Problem list   VT (torsades) arrest: econdary to electrolyte derangements in the setting of aggressive diuresis. Bradycardic PVC seemed to cause rhythm change. Patient regained consciousness post arrest and did not require intubation.   Assessment & Plan:    Acute on chronic HFrEF: LVEF 40-45%. IVC down on echo despite anasarca pointing away from cardiac cause.  Sinus tachycardia, polymorphic VT and prolonged QTc with associated dyspnea/hypoxia when HR spikes.  Plan Continue aldactone, losartan, betablocker Continue diuresis  Follow electrolytes  Severe Protein calorie malnutrition w/ diffuse Anasarca w/ severe hypoalbuminemia:  working dx at this point is protein  loss enteropathy?  -gi now following, lots of work up already  -tolerating her diet slowly  Plan: Continue TPN We will need to slowly come off of this  But she is now finally tolerating some minimal PO  Stop cholestyramine cause  she feels constipated now   Acute hypoxemic respiratory failure Bilateral pleural effusions: protein low and LDH marginally elevated, but no serum comparisons. Likely due to hypoalbuminemia as above.  Plan continue IV diuresis Chest x-ray as needed wean oxygen supplementation to maintain O2 sats greater than 90%  Fluid and electrolyte imbalance: Hypokalemia, hyponatremia, hypomagnesemia Plan Follow and replete   DVT Plan IV heparin   Acute R renal infarct source unclear - ? Atrial fib history, per cards Plan Continue [facility]   Best Practice (right click and "Reselect all SmartList Selections" daily)   Diet/type: Regular consistency (see orders) DVT prophylaxis: DOAC GI prophylaxis: PPI Lines: N/A Foley:  N/A Code Status:  full code Last date of multidisciplinary goals of care discussion [ ]   Stable for transfer to [place], [place] 05/12/2021 10:47 AM

## 2021-05-13 DIAGNOSIS — E876 Hypokalemia: Secondary | ICD-10-CM

## 2021-05-13 DIAGNOSIS — I469 Cardiac arrest, cause unspecified: Secondary | ICD-10-CM | POA: Diagnosis not present

## 2021-05-13 DIAGNOSIS — R5381 Other malaise: Secondary | ICD-10-CM

## 2021-05-13 DIAGNOSIS — I5043 Acute on chronic combined systolic (congestive) and diastolic (congestive) heart failure: Secondary | ICD-10-CM

## 2021-05-13 DIAGNOSIS — E871 Hypo-osmolality and hyponatremia: Secondary | ICD-10-CM

## 2021-05-13 DIAGNOSIS — R601 Generalized edema: Secondary | ICD-10-CM | POA: Diagnosis not present

## 2021-05-13 DIAGNOSIS — I428 Other cardiomyopathies: Secondary | ICD-10-CM | POA: Diagnosis not present

## 2021-05-13 LAB — CBC
HCT: 20.2 % — ABNORMAL LOW (ref 36.0–46.0)
Hemoglobin: 7.3 g/dL — ABNORMAL LOW (ref 12.0–15.0)
MCH: 33.8 pg (ref 26.0–34.0)
MCHC: 36.1 g/dL — ABNORMAL HIGH (ref 30.0–36.0)
MCV: 93.5 fL (ref 80.0–100.0)
Platelets: 194 10*3/uL (ref 150–400)
RBC: 2.16 MIL/uL — ABNORMAL LOW (ref 3.87–5.11)
RDW: 16.4 % — ABNORMAL HIGH (ref 11.5–15.5)
WBC: 7.2 10*3/uL (ref 4.0–10.5)
nRBC: 0 % (ref 0.0–0.2)

## 2021-05-13 LAB — BASIC METABOLIC PANEL
Anion gap: 4 — ABNORMAL LOW (ref 5–15)
BUN: 13 mg/dL (ref 8–23)
CO2: 31 mmol/L (ref 22–32)
Calcium: 7.7 mg/dL — ABNORMAL LOW (ref 8.9–10.3)
Chloride: 98 mmol/L (ref 98–111)
Creatinine, Ser: 0.45 mg/dL (ref 0.44–1.00)
GFR, Estimated: >60 mL/min (ref >60)
Glucose, Bld: 113 mg/dL — ABNORMAL HIGH (ref 70–99)
Potassium: 3.8 mmol/L (ref 3.5–5.1)
Sodium: 133 mmol/L — ABNORMAL LOW (ref 135–145)

## 2021-05-13 LAB — GLUCOSE, CAPILLARY: Glucose-Capillary: 118 mg/dL — ABNORMAL HIGH (ref 70–99)

## 2021-05-13 LAB — HEPARIN LEVEL (UNFRACTIONATED): Heparin Unfractionated: 0.67 IU/mL (ref 0.30–0.70)

## 2021-05-13 LAB — PHOSPHORUS: Phosphorus: 2.2 mg/dL — ABNORMAL LOW (ref 2.5–4.6)

## 2021-05-13 LAB — MAGNESIUM: Magnesium: 2 mg/dL (ref 1.7–2.4)

## 2021-05-13 LAB — APTT: aPTT: 103 seconds — ABNORMAL HIGH (ref 24–36)

## 2021-05-13 MED ORDER — POTASSIUM PHOSPHATES 15 MMOLE/5ML IV SOLN
15.0000 mmol | Freq: Once | INTRAVENOUS | Status: AC
  Administered 2021-05-13: 15 mmol via INTRAVENOUS
  Filled 2021-05-13: qty 5

## 2021-05-13 MED ORDER — TRACE MINERALS CU-MN-SE-ZN 300-55-60-3000 MCG/ML IV SOLN
INTRAVENOUS | Status: AC
  Filled 2021-05-13: qty 336

## 2021-05-13 MED ORDER — THIAMINE HCL 100 MG PO TABS
100.0000 mg | ORAL_TABLET | Freq: Every day | ORAL | Status: DC
  Administered 2021-05-14 – 2021-05-22 (×9): 100 mg via ORAL
  Filled 2021-05-13 (×10): qty 1

## 2021-05-13 NOTE — Progress Notes (Signed)
eLink Physician-Brief Progress Note Patient Name: Stephanne Greeley DOB: Jun 05, 1951 MRN: [id number]   Date of Service  05/13/2021  HPI/Events of Note  Notified of  phos 2.2 Creatinine 0.45  eICU Interventions  Ordered Kphos 15 mmol     Intervention Category Intermediate Interventions: Electrolyte abnormality - evaluation and management  Judd Lien 05/13/2021, 6:10 AM

## 2021-05-13 NOTE — Progress Notes (Signed)
ANTICOAGULATION CONSULT NOTE - Follow Up Consult  Pharmacy Consult for Heparin Indication: DVT  No Known Allergies  Patient Measurements: Height: 5\' 1"  (154.9 cm) Weight: 51.8 kg (114 lb 3.2 oz) IBW/kg (Calculated) : 47.8 Heparin Dosing Weight: 61.5 kg  Vital Signs: Temp: 99.1 F (37.3 C) (11/13 1124) Temp Source: Oral (11/13 1124) BP: 120/69 (11/13 1000) Pulse Rate: 95 (11/13 1000)  Labs: Recent Labs    05/11/21 0317 05/11/21 1007 05/11/21 1012 05/11/21 1849 05/12/21 0400 05/12/21 1620 05/13/21 0455  HGB 8.9*   < > 9.9*  9.9*  --  8.1*  --  7.3*  HCT 24.6*   < > 29.0*  29.0*  --  22.9*  --  20.2*  PLT 224  --   --   --  200  --  194  APTT 70*  --   --   --  85*  --  103*  HEPARINUNFRC >1.10*  --   --   --  0.69  --  0.67  CREATININE 0.44  --   --    < > 0.45 0.42* 0.45   < > = values in this interval not displayed.     Estimated Creatinine Clearance: 51.5 mL/min (by C-G formula based on SCr of 0.45 mg/dL).   Medications:  Scheduled:   carvedilol  6.25 mg Oral BID WC   Chlorhexidine Gluconate Cloth  6 each Topical Daily   furosemide  80 mg Intravenous Q12H   insulin aspart  0-9 Units Subcutaneous Q6H   losartan  25 mg Oral Daily   mouth rinse  15 mL Mouth Rinse BID   megestrol  400 mg Oral BID   potassium chloride  40 mEq Oral BID   sodium chloride flush  10-40 mL Intracatheter Q12H   sodium chloride flush  3 mL Intravenous Q12H   spironolactone  25 mg Oral Daily   sucralfate  1 g Oral BID   [START ON 05/14/2021] thiamine  100 mg Oral Daily   Infusions:   sodium chloride 10 mL/hr at 05/11/21 2100   heparin 750 Units/hr (05/13/21 0600)   thiamine injection 250 mg (05/13/21 0956)   TPN ADULT (ION) 45 mL/hr at 05/13/21 0600   TPN ADULT (ION)      Assessment: 70 yo female with DVT and renal infarct on apixaban (Last dose of apixaban 10 mg given 11/9 at ~10am). She is noted s/p cardiac arrest 11/7 and and EF 25-30%.  She is on TPN for malabsorption and  also changing to IV heparin.  She is now s/p cath with non-obstructive CAD. Heparin restarted post-cath. Using aPTTs for heparin monitoring. Elevated heparin level with recent apixaban.  AM aPTT just barely over goal at 103 seconds, and anti-Xa level starting to downtrend getting closer to correlating with aPTT.  No overt bleeding or complications noted.  Goal of Therapy:  Heparin level 0.3-0.7 units/ml aPTT 66-102 seconds Monitor platelets by anticoagulation protocol: Yes   Plan:  Decrease IV heparin just a little to keep in goal range to 700 units/hr. Daily aPTT/HL, CBC, s/s bleeding  [place] Crane, Roylene Reason, [facility] Clinical Pharmacist  05/13/2021 1:27 PM   [facility] pharmacy phone numbers are listed on [email or web address]

## 2021-05-13 NOTE — Progress Notes (Signed)
PROGRESS NOTE  Debbie Barry OTL:[id number] DOB: 03-24-1951   PCP: Glendale Chard, MD  Patient is from: SNF.  Uses walker at baseline.  DOA: 05/02/2021 LOS: 10  Chief complaints:  Chief Complaint  Patient presents with   Chest Pain   Abdominal Pain     Brief Narrative / Interim history: 70 year old F with PMH of fatty liver, osteomyelitis, HTN, GERD, ambulatory dysfunction and a recent hospitalization for lower extremity weakness due to spinal stenosis for which medical management was recommended and she was discharged to SNF returning to ED with chest pain and palpitation, and found to have age-indeterminate RLE DVT, large bilateral pleural effusion, anasarca, acute right renal infarct comprising less than 10% of right renal cortex.  She was started on IV heparin.  Pleural effusion and anasarca was felt to be due to malnutrition/hypoalbuminemia.  She underwent right Thora with removal of 500 cc transudative culture negative fluid.  Cytology was negative as well.  Started on diuretics.   Patient went into V. fib cardiac arrest the morning of 11/7 with ROSC after 3 minutes of CPR prior to defibrillation.  Then, she went into pulsatile VT and had 1 DCCV.  She was transferred to ICU after cardiology consultation.  K and Mg were found to be low.  TTE with LVEF of 40 to 45% and RWMA on 11/7.Marland Kitchen  Repeat TTE on 11/9 with LVEF of 25 to 30%, GH, G1 DD and moderately reduced RV SF.  R/LHC on 11/11 with normal right heart pressures and without obstructive CAD suggesting NICM.  And also started on TPN on 11/9.    GI consulted for malnutrition.  Concern about protein-losing enteropathy. Extensive work-up significant for low vitamin A, transferrin and ceruloplasmin  but negative for C. difficile, GIP, Giardia, crypto, A1A, and celiac disease.  Diarrhea that has resolved with cholestyramine.  Started taking p.o.  Weaning off TPN.  GI signed off.  TRH assumed care on 11/13.  Subjective: Seen and examined  earlier this morning.  No major events overnight of this morning.  No complaints.  She denies pain, dyspnea, GI or UTI symptoms.  She is eating her breakfast.  Objective: Vitals:   05/13/21 0800 05/13/21 0820 05/13/21 0900 05/13/21 1000  BP: (!) 115/56  125/67 120/69  Pulse: 94  97 95  Resp: 12  16 17   Temp:  99.1 F (37.3 C)    TempSrc:  Oral    SpO2: 98%  95% 99%  Weight:      Height:        Intake/Output Summary (Last 24 hours) at 05/13/2021 1116 Last data filed at 05/13/2021 1000 Gross per 24 hour  Intake 2122.34 ml  Output 2300 ml  Net -177.66 ml   Filed Weights   05/11/21 0500 05/12/21 0500 05/13/21 0500  Weight: 55 kg 55 kg 51.8 kg    Examination:  GENERAL: Frail looking elderly female.  No apparent distress.  Nontoxic. HEENT: MMM.  Vision and hearing grossly intact.  NECK: Supple.  No apparent JVD.  RESP: 99% on RA.  No IWOB.  Fair aeration bilaterally. CVS:  RRR. Heart sounds normal.  ABD/GI/GU: BS+. Abd soft, NTND.  MSK/EXT:  Moves extremities. No apparent deformity.  Diffuse dependent edema. SKIN: no apparent skin lesion or wound NEURO: Awake, alert and oriented appropriately.  No apparent focal neuro deficit. PSYCH: Calm. Normal affect.   Procedures:  11/3-right thoracocentesis with removal of 500 cc  Microbiology summarized: 11/03-right Thora with removal of 500 cc transudative and  culture negative fluid.  Cytology negative as well. 11/07-3 minutes of CPR for VF.  Pulsatile VT s/p DCCV about 2 hours later. 11/11-R/LHC with normal right heart pressures and nonobstructive CAD  Assessment & Plan: Ventricular fibrillation/torsades followed by VT/prolonged QT: Likely secondary to electrolyte derangements in the setting of aggressive diuresis.  ROSC after 3 minutes of CPR.  Had pulsatile VT after ROSC see requiring 1 DCCV.  TTE with LVEF of 40 to 45% and RWMA on 11/7.Marland Kitchen  Repeat TTE on 11/9 with LVEF of 25 to 30%, GH, G1 DD and moderately reduced RV SF.  R/LHC  on 11/11 with normal right heart pressures and without obstructive CAD suggesting NICM. -Cardiology following.  Acute on chronic combined CHF: TTE and R/LHC as above.  She had bilateral pleural effusion and anasarca on presentation.  Still with residual anasarca/dependent edema. -Cardiology following and managing. -On IV Lasix 80 mg twice daily -GDMT-BB, losartan, Aldactone -Monitor fluid status, renal functions and electrolytes. -Sodium and fluid restriction  Anasarca: Multifactorial including CHF and severe malnutrition with severe hypoalbuminemia.  Concern for protein-losing nephropathy.  Extensive work-up by GI as above.   -Diuretics as above -Optimize nutrition-see below  Acute respiratory failure with hypoxia-in the setting of fluid overload and bilateral pleural effusion.  Resolved. -Manage CHF/fluid overload as above. -Encourage I-S, OOB/PT/OT  Hyponatremia: Likely from hypervolemia.  Stable.   Hypokalemia/hypomagnesemia/hypophosphatemia: Reportedly had diarrhea that has resolved.  K3.8.  PT 2.2.  Mg 2.0. -IV potassium phosphate 50 mg x 1  Normocytic anemia: Hgb 12 on presentation.  Slowly drifting.  No report of bleeding anywhere.  Could be anemia of critical illness and malnutrition.  However, she is also on IV heparin for DVT.  No iron deficiency anemia arguing against chronic blood loss.  However, low transferrin suggesting poor GI absorption. Recent Labs    05/05/21 0403 05/07/21 0855 05/08/21 0520 05/09/21 0635 05/10/21 0440 05/11/21 0317 05/11/21 1007 05/11/21 1012 05/12/21 0400 05/13/21 0455  HGB 10.7* 10.2* 9.2* 9.6* 8.3* 8.9* 9.5* 9.9*  9.9* 8.1* 7.3*  -Check Hemoccult -Transfuse for Hgb less than 7.0.  Verbally consented. -Continue monitoring   Spinal stenosis/lower extremity weakness/ambulatory dysfunction -PT/OT  Right lower extremity DVT-age-indeterminate DVT extending all the way to the iliac vessels -Continue IV heparin  Acute right renal  infarct-involving about 10% of right renal cortex.  From arrhythmia?  Shunt?  Renal function stable. -Continue IV heparin  Severe malnutrition as evidenced by anasarca and severe hypoalbuminemia. -P.o. intake improved.  Ate about 75% of her breakfast. -Continue weaning off TPN.  Decreasing to 50% today with a plan to stop altogether Body mass index is 21.58 kg/m. Nutrition Problem: Severe Malnutrition Etiology: chronic illness (chronic N/V/D) Signs/Symptoms: severe muscle depletion, energy intake < or equal to 50% for > or equal to 1 month Interventions: TPN   DVT prophylaxis:  SCD's Start: 05/11/21 1201 Place and maintain sequential compression device Start: 05/03/21 1316   Patient is on IV heparin for DVT. Code Status: Full code Family Communication: Patient and/or RN. Available if any question.  Level of care: Telemetry Cardiac Status is: Inpatient  Remains inpatient appropriate because: Fluid overload/CHF exacerbation requiring IV diuretics, electrolyte derangement, VTE requiring IV heparin and severe malnutrition requiring TPN   Consultants:  Cardiology Pulmonology Gastroenterology   Sch Meds:  Scheduled Meds:  carvedilol  6.25 mg Oral BID WC   Chlorhexidine Gluconate Cloth  6 each Topical Daily   furosemide  80 mg Intravenous Q12H   insulin aspart  0-9  Units Subcutaneous Q6H   losartan  25 mg Oral Daily   mouth rinse  15 mL Mouth Rinse BID   megestrol  400 mg Oral BID   potassium chloride  40 mEq Oral BID   sodium chloride flush  10-40 mL Intracatheter Q12H   sodium chloride flush  3 mL Intravenous Q12H   spironolactone  25 mg Oral Daily   sucralfate  1 g Oral BID   [START ON 05/14/2021] thiamine  100 mg Oral Daily   Continuous Infusions:  sodium chloride 10 mL/hr at 05/11/21 2100   heparin 750 Units/hr (05/13/21 0600)   potassium PHOSPHATE IVPB (in mmol) 15 mmol (05/13/21 0651)   thiamine injection 250 mg (05/13/21 0956)   TPN ADULT (ION) 45 mL/hr at  05/13/21 0600   TPN ADULT (ION)     PRN Meds:.sodium chloride, acetaminophen, docusate sodium, HYDROcodone-acetaminophen, hydrocortisone cream, hydrOXYzine, melatonin, sodium chloride flush  Antimicrobials: Anti-infectives (From admission, onward)    None        I have personally reviewed the following labs and images: CBC: Recent Labs  Lab 05/09/21 0635 05/10/21 0440 05/11/21 0317 05/11/21 1007 05/11/21 1012 05/12/21 0400 05/13/21 0455  WBC 8.4 6.3 7.0  --   --  7.7 7.2  HGB 9.6* 8.3* 8.9* 9.5* 9.9*  9.9* 8.1* 7.3*  HCT 27.7* 23.6* 24.6* 28.0* 29.0*  29.0* 22.9* 20.2*  MCV 95.5 94.4 93.5  --   --  94.2 93.5  PLT 273 209 224  --   --  200 194   BMP &GFR Recent Labs  Lab 05/09/21 0635 05/09/21 1725 05/10/21 0440 05/10/21 1800 05/11/21 0317 05/11/21 1007 05/11/21 1012 05/11/21 1849 05/12/21 0400 05/12/21 1620 05/13/21 0455  NA 133*   < > 134* 137 137   < > 135  135 134* 134* 134* 133*  K 4.6   < > 3.1* 2.8* 3.6   < > 3.2*  3.3* 3.2* 3.5 3.6 3.8  CL 99   < > 96* 95* 96*  --   --  94* 95* 96* 98  CO2 26   < > 30 35* 36*  --   --  35* 34* 32 31  GLUCOSE 124*   < > 103* 96 106*  --   --  134* 111* 108* 113*  BUN 5*   < > <5* <5* 5*  --   --  6* 9 10 13   CREATININE 0.40*   < > 0.36* 0.49 0.44  --   --  0.46 0.45 0.42* 0.45  CALCIUM 7.8*   < > 7.9* 7.9* 7.6*  --   --  7.6* 7.6* 7.7* 7.7*  MG 1.9   < > 1.6* 1.7 1.8  --   --   --  1.6* 2.1 2.0  PHOS 3.9  --  3.6  --  2.7  --   --   --  2.7  --  2.2*   < > = values in this interval not displayed.   Estimated Creatinine Clearance: 51.5 mL/min (by C-G formula based on SCr of 0.45 mg/dL). Liver & Pancreas: Recent Labs  Lab 05/07/21 0855 05/07/21 2343 05/10/21 0440  AST 50* 29 22  ALT 11 16 14   ALKPHOS 89 96 92  BILITOT 0.6 0.8 0.6  PROT 3.4* 4.0* 4.3*  ALBUMIN <1.5* 1.7* 1.9*   No results for input(s): LIPASE, AMYLASE in the last 168 hours. No results for input(s): AMMONIA in the last 168  hours. Diabetic: No results for  input(s): HGBA1C in the last 72 hours. Recent Labs  Lab 05/11/21 1823 05/11/21 2339 05/12/21 0621 05/12/21 1158 05/13/21 0626  GLUCAP 133* 135* 124* 134* 118*   Cardiac Enzymes: No results for input(s): CKTOTAL, CKMB, CKMBINDEX, TROPONINI in the last 168 hours. No results for input(s): PROBNP in the last 8760 hours. Coagulation Profile: No results for input(s): INR, PROTIME in the last 168 hours. Thyroid Function Tests: No results for input(s): TSH, T4TOTAL, FREET4, T3FREE, THYROIDAB in the last 72 hours. Lipid Profile: No results for input(s): CHOL, HDL, LDLCALC, TRIG, CHOLHDL, LDLDIRECT in the last 72 hours. Anemia Panel: No results for input(s): VITAMINB12, FOLATE, FERRITIN, TIBC, IRON, RETICCTPCT in the last 72 hours. Urine analysis:    Component Value Date/Time   COLORURINE AMBER (A) 05/02/2021 2100   APPEARANCEUR CLOUDY (A) 05/02/2021 2100   LABSPEC 1.026 05/02/2021 2100   PHURINE 6.0 05/02/2021 2100   GLUCOSEU NEGATIVE 05/02/2021 2100   HGBUR LARGE (A) 05/02/2021 2100   BILIRUBINUR NEGATIVE 05/02/2021 2100   BILIRUBINUR negative 03/30/2020 [place] 05/02/2021 2100   PROTEINUR 100 (A) 05/02/2021 2100   UROBILINOGEN 0.2 03/30/2020 1215   NITRITE NEGATIVE 05/02/2021 2100   LEUKOCYTESUR MODERATE (A) 05/02/2021 2100   Sepsis Labs: Invalid input(s): PROCALCITONIN, [place]  Microbiology: Recent Results (from the past 240 hour(s))  Gram stain     Status: None   Collection Time: 05/03/21  3:15 PM   Specimen: Lung, Right; Pleural Fluid  Result Value Ref Range Status   Specimen Description FLUID PLEURAL RIGHT  Final   Special Requests NONE  Final   Gram Stain   Final    WBC PRESENT,BOTH PMN AND MONONUCLEAR NO ORGANISMS SEEN SQUAMOUS EPITHELIAL CELLS PRESENT CYTOSPIN SMEAR Performed at [facility], [address] [place], [place] [address]    Report Status 05/03/2021 FINAL  Final  Acid Fast Smear (AFB)      Status: None   Collection Time: 05/03/21  3:15 PM   Specimen: Lung, Right; Pleural Fluid  Result Value Ref Range Status   AFB Specimen Processing Concentration  Final   Acid Fast Smear Negative  Final    Comment: (NOTE) Performed At: [facility] [place], [place] [id number] Rush Farmer MD DD:[id number]    Source (AFB) FLUID  Final    Comment: PLEURAL RIGHT Performed at [facility], [place] [address], [place], [place] [address]   MRSA Next Gen by PCR, Nasal     Status: None   Collection Time: 05/07/21  1:27 PM   Specimen: Nasal Mucosa; Nasal Swab  Result Value Ref Range Status   MRSA by PCR Next Gen NOT DETECTED NOT DETECTED Final    Comment: (NOTE) The GeneXpert MRSA Assay (FDA approved for NASAL specimens only), is one component of a comprehensive MRSA colonization surveillance program. It is not intended to diagnose MRSA infection nor to guide or monitor treatment for MRSA infections. Test performance is not FDA approved in patients less than 47 years old. Performed at [facility], [place] [address] [place], [place] [address]   Giardia/Cryptosporidium EIA     Status: None   Collection Time: 05/08/21  2:48 PM   Specimen: Per Rectum; Stool  Result Value Ref Range Status   Giardia Ag, Stl Negative Negative Final   Cryptosporidium EIA Negative Negative Final    Comment: (NOTE) Performed At: [facility] [place], [place] [id number] Rush Farmer MD GG:[id number]    Source of  Sample STOOL  Final    Comment: Performed at [facility], [place] [address] [place] [address], [place], [place] [address]  C Difficile Quick Screen (NO PCR Reflex)     Status: None   Collection Time: 05/08/21  2:48 PM   Specimen: Per Rectum; Stool  Result Value Ref Range Status   C Diff antigen NEGATIVE NEGATIVE Final   C Diff toxin NEGATIVE NEGATIVE Final   C Diff interpretation No C. difficile detected.  Final    Comment: Performed at [facility], [place] [address], [place], [place] [address]  Gastrointestinal Panel by PCR , Stool     Status: None   Collection Time: 05/08/21  2:48 PM   Specimen: Per Rectum; Stool  Result Value Ref Range Status   Campylobacter species NOT DETECTED NOT DETECTED Final   Plesimonas shigelloides NOT DETECTED NOT DETECTED Final   Salmonella species NOT DETECTED NOT DETECTED Final   Yersinia enterocolitica NOT DETECTED NOT DETECTED Final   Vibrio species NOT DETECTED NOT DETECTED Final   Vibrio cholerae NOT DETECTED NOT DETECTED Final   Enteroaggregative E coli (EAEC) NOT DETECTED NOT DETECTED Final   Enteropathogenic E coli (EPEC) NOT DETECTED NOT DETECTED Final   Enterotoxigenic E coli (ETEC) NOT DETECTED NOT DETECTED Final   Shiga like toxin producing E coli (STEC) NOT DETECTED NOT DETECTED Final   Shigella/Enteroinvasive E coli (EIEC) NOT DETECTED NOT DETECTED Final   Cryptosporidium NOT DETECTED NOT DETECTED Final   Cyclospora cayetanensis NOT DETECTED NOT DETECTED Final   Entamoeba histolytica NOT DETECTED NOT DETECTED Final   Giardia lamblia NOT DETECTED NOT DETECTED Final   Adenovirus F40/41 NOT DETECTED NOT DETECTED Final   Astrovirus NOT DETECTED NOT DETECTED Final   Norovirus GI/GII NOT DETECTED NOT DETECTED Final   Rotavirus A NOT DETECTED NOT DETECTED Final   Sapovirus (I, II, IV, and V) NOT DETECTED NOT DETECTED Final    Comment: Performed at [facility], [place] [place], [place] [address]  Culture, blood (Routine X 2) w Reflex to ID Panel     Status: None (Preliminary result)   Collection Time: 05/09/21 10:53 AM   Specimen: BLOOD LEFT ARM  Result Value Ref Range Status   Specimen Description BLOOD LEFT ARM  Final   Special Requests   Final    BOTTLES DRAWN AEROBIC ONLY Blood Culture results may not be optimal due to an inadequate volume of blood received in culture bottles   Culture   Final    NO GROWTH 4 DAYS Performed at [facility], [address] [place],  [address]    Report Status PENDING  Incomplete    Radiology Studies: No results found.  60 minutes with more than 50% spent in reviewing records, counseling patient/family and coordinating care.   Muad Noga T. Valinda  If 7PM-7AM, please contact night-coverage [email or web address] 05/13/2021, 11:16 AM

## 2021-05-13 NOTE — Progress Notes (Signed)
PHARMACY - TOTAL PARENTERAL NUTRITION CONSULT NOTE   Indication:  Intolerance to po intake, concern for malabsorption  Patient Measurements: Height: 5\' 1"  (154.9 cm) Weight: 51.8 kg (114 lb 3.2 oz) IBW/kg (Calculated) : 47.8 TPN AdjBW (KG): 52.3 Body mass index is 21.58 kg/m. Usual Weight: 60-65 kg  Assessment:  70 yo F admitted for DVT, acute on chronic CHF with anasarca. Patient had a cardiac arrest 11/9 likely due to electrolyte abnormalities. Patient's condition acutely worsened overnight 11/9 with concern for volume overload, N/V, and epigastric pain. Undigested/whole medications in bowel movements concerning for malabsorption. Noted limited po intake since Feb 2022.  Glucose / Insulin: no hx DM. CBGs <180, utilized 4 units sSSI in last 24hrs Electrolytes:  Na 133, K up to 3.8 (S/p K runs x 2 + KCl 68mEq PO x 2 doses yesterday), CL/CO2 normalized, Phos down to 2.2 (KPhos 22mmol already ordered per MD this AM), Mg 2 (s/p mag sulfate 4g IV x 1 yesterday) Renal:  Scr 0.45 (stable), BUN WNL UOP 4250 ml/24hr. Lasix drip to Lasix 80mg  IV q12h on 11/12 Hepatic: LFTs / Tbili / TG WNL, albumin 1.9 Intake / Output; MIVF: LBM 11/12 GI Imaging:11/3 CT abdomen: Mild ascites, marked diffuse body wall subQ edema GI Surgeries / Procedures: Vitamins: 11/8 Copper 80 (WNL), Ceruloplasmin 16.9 (low), Vitamin B1 149.5 (WNL), Vitamin D 87.7 (WNL), Vitamin B6 4.5 (WNL), Zinc 46 (WNL)  Central access: PICC placed 11/7 TPN start date: 11/9  Nutritional Goals: Goal TPN rate 65 ml/hr (109gm AA, 1863 kcal)  RD Assessment: Estimated Needs Total Energy Estimated Needs: 1800-2000 Total Protein Estimated Needs: 105-120 Total Fluid Estimated Needs: >1.8 L/day  Current Nutrition:  TPN (weaned to 75% goal rate 11/12) Heart diet started 11/11 - poor appetite, minimal PO intake 11/12 due to constipation (cholestyramine d/c'd and had BM) >> feeling much better and appetite better 11/13 per RN, ate ~75% of  breakfast  11/10- Unable to tolerate feeding tube placement/attempted placement of postpyloric feeding tube (only advanced up to the stomach), was removed subsequently. 11/10 States she has marked improvement in nausea, able to tolerate some PO   Plan:  -Wean concentrated TPN further today per discussion with Dr. Cyndia Skeeters - reduce TPN to 30 ml/hr (~50% goal rate) at 1800. Plan to d/c TPN tomorrow if adequate PO intake continues. -Electrolytes in TPN: increase Na to 90 mEq/L, K 50 mEq/L, Ca 5 mEq/L, Mag 15 mEq/L, and increase Phos to 15 mmol/L. Cl:Ac at max Cl -Continue scheduled K 40 mEq BID while on scheduled Lasix IV (continuing same dose today per Cards) -KPhos 15 mmol IV x 1 already ordered per MD this AM -Add standard MVI and trace elements to TPN -Continue Sensitive q6h SSI and adjust as needed. Consider d/c 11/14 if continues to minimally utilize -Patient on high dose thiamine until 11/13 -Vitamin levels WNL - no additional replacement needed -Monitor TPN labs, watch electrolytes closely -F/u PO intake/tolerance and ability to d/c TPN tomorrow   Arturo Morton, PharmD, BCPS Please check AMION for all [place] contact numbers Clinical Pharmacist 05/13/2021 7:47 AM

## 2021-05-13 NOTE — Progress Notes (Signed)
Progress Note  Patient Name: Chosen Garron Date of Encounter: 05/13/2021  Primary Cardiologist: Evalina Field, MD  Subjective   No abdominal pain or chest pain, still feels constipated.  No shortness of breath at rest.  Inpatient Medications    Scheduled Meds:  carvedilol  6.25 mg Oral BID WC   Chlorhexidine Gluconate Cloth  6 each Topical Daily   furosemide  80 mg Intravenous Q12H   insulin aspart  0-9 Units Subcutaneous Q6H   losartan  25 mg Oral Daily   mouth rinse  15 mL Mouth Rinse BID   megestrol  400 mg Oral BID   potassium chloride  40 mEq Oral BID   sodium chloride flush  10-40 mL Intracatheter Q12H   sodium chloride flush  3 mL Intravenous Q12H   spironolactone  25 mg Oral Daily   sucralfate  1 g Oral BID   Continuous Infusions:  sodium chloride 10 mL/hr at 05/11/21 2100   heparin 750 Units/hr (05/13/21 0600)   potassium PHOSPHATE IVPB (in mmol) 15 mmol (05/13/21 0651)   thiamine injection Stopped (05/12/21 2205)   TPN ADULT (ION) 45 mL/hr at 05/13/21 0600   PRN Meds: sodium chloride, acetaminophen, docusate sodium, HYDROcodone-acetaminophen, hydrocortisone cream, hydrOXYzine, melatonin, sodium chloride flush   Vital Signs    Vitals:   05/13/21 0200 05/13/21 0356 05/13/21 0500 05/13/21 0600  BP: (!) 102/53  (!) 108/57 111/60  Pulse: 90  95 98  Resp: 13  18 12   Temp:  98.7 F (37.1 C)    TempSrc:  Oral    SpO2: 96%  97% 95%  Weight:   51.8 kg   Height:        Intake/Output Summary (Last 24 hours) at 05/13/2021 0758 Last data filed at 05/13/2021 0600 Gross per 24 hour  Intake 1653.45 ml  Output 3100 ml  Net -1446.55 ml   Filed Weights   05/11/21 0500 05/12/21 0500 05/13/21 0500  Weight: 55 kg 55 kg 51.8 kg    Telemetry    Sinus rhythm and sinus tachycardia.  Personally reviewed.  ECG    No ECG reviewed.  Physical Exam   GEN: No acute distress.   Neck: No JVD. Cardiac: RRR, no murmur or gallop.  Respiratory: Nonlabored.   Decreased breath sounds at the bases. GI: Soft, nontender, diminished bowel sounds. MS: Trace lower leg edema; No deformity. Neuro:  Nonfocal. Psych: Alert and oriented x 3. Normal affect.  Labs    Chemistry Recent Labs  Lab 05/07/21 0855 05/07/21 1825 05/07/21 2343 05/08/21 0520 05/10/21 0440 05/10/21 1800 05/12/21 0400 05/12/21 1620 05/13/21 0455  NA 138   < > 134*   < > 134*   < > 134* 134* 133*  K 2.6*   < > 2.5*   < > 3.1*   < > 3.5 3.6 3.8  CL 108   < > 99   < > 96*   < > 95* 96* 98  CO2 24   < > 29   < > 30   < > 34* 32 31  GLUCOSE 74   < > 153*   < > 103*   < > 111* 108* 113*  BUN <5*   < > <5*   < > <5*   < > 9 10 13   CREATININE 0.38*   < > 0.48   < > 0.36*   < > 0.45 0.42* 0.45  CALCIUM 5.5*   < > 7.4*   < >  7.9*   < > 7.6* 7.7* 7.7*  PROT 3.4*  --  4.0*  --  4.3*  --   --   --   --   ALBUMIN <1.5*  --  1.7*  --  1.9*  --   --   --   --   AST 50*  --  29  --  22  --   --   --   --   ALT 11  --  16  --  14  --   --   --   --   ALKPHOS 89  --  96  --  92  --   --   --   --   BILITOT 0.6  --  0.8  --  0.6  --   --   --   --   GFRNONAA >60   < > >60   < > >60   < > >60 >60 >60  ANIONGAP 6   < > 6   < > 8   < > 5 6 4*   < > = values in this interval not displayed.     Hematology Recent Labs  Lab 05/11/21 0317 05/11/21 1007 05/11/21 1012 05/12/21 0400 05/13/21 0455  WBC 7.0  --   --  7.7 7.2  RBC 2.63*  --   --  2.43* 2.16*  HGB 8.9*   < > 9.9*  9.9* 8.1* 7.3*  HCT 24.6*   < > 29.0*  29.0* 22.9* 20.2*  MCV 93.5  --   --  94.2 93.5  MCH 33.8  --   --  33.3 33.8  MCHC 36.2*  --   --  35.4 36.1*  RDW 15.9*  --   --  15.9* 16.4*  PLT 224  --   --  200 194   < > = values in this interval not displayed.    Cardiac Enzymes Recent Labs  Lab 05/02/21 1945 05/03/21 0304  TROPONINIHS 28* 32*    BNP Recent Labs  Lab 05/07/21 0900  BNP 482.9*     Radiology    CARDIAC CATHETERIZATION  Result Date: 05/11/2021   Prox LAD lesion is 20% stenosed.    Ost LAD to Prox LAD lesion is 20% stenosed. No  obstructive CAD with minimal coronary calcification at the ostium and proximal LAD with 20% narrowing.  Otherwise normal coronary arteries. Normal right heart pressures Findings compatible with a nonischemic cardiomyopathy. Significant calcification in the femoral arteries right greater than left with significant luminal calcification in the right femoral artery. RECOMMENDATION: Guideline directed medical therapy for severe LV dysfunction.  Avoidance of QT prolongation medications.  Patient has significant claudication symptomatology.    Cardiac Studies   Echocardiogram 05/09/2021:  1. Left ventricular ejection fraction, by estimation, is 25 to 30%. The  left ventricle has severely decreased function. The left ventricle  demonstrates global hypokinesis. Left ventricular diastolic parameters are  consistent with Grade I diastolic  dysfunction (impaired relaxation).   2. Right ventricular systolic function is moderately reduced. The right  ventricular size is mildly enlarged. Tricuspid regurgitation signal is  inadequate for assessing PA pressure.   3. The mitral valve is normal in structure. Mild mitral valve  regurgitation. No evidence of mitral stenosis.   4. The aortic valve is tricuspid. Aortic valve regurgitation is not  visualized. Mild aortic valve sclerosis is present, with no evidence of  aortic valve stenosis.   5. The inferior vena cava is  dilated in size with <50% respiratory  variability, suggesting right atrial pressure of 15 mmHg.   6. There is a left pleural effusion.   7. Limited echo, biventricular dysfunction.  Assessment & Plan    1.  Nonischemic cardiomyopathy with biventricular heart failure, LVEF 25 to 30%, global hypokinesis, also moderate RV dysfunction.  Cardiac catheterization on November 11 showed mild coronary atherosclerosis and normal right heart pressures, mean wedge pressure of 7 and cardiac index 3.3.  Currently  on Coreg with dose increased yesterday, losartan, Aldactone, and Lasix with potassium supplement.  2.  S/p cardiac arrest due to polymorphic VT/torsades in the setting of QT prolongation.  Follow-up ECG pending.  3.  Volume overload with anasarca.  Switched from Lasix infusion to divided dose IV Lasix yesterday.  Net urine output of approximately 1500 cc last 24 hours with stable renal function.  Potassium up to 3.8 and magnesium 2.0.  4.  Severe protein deficiency and hypoalbuminemia.  GI consultation reviewed.  5.  Right lower extremity DVT and acute renal infarct, currently on heparin.  Repeat ECG pending.  Continue current dose of IV Lasix with reasonable diuresis and stable renal function.  Follow electrolytes for repletion needs.  Continue Coreg, Cozaar, and Aldactone at present doses.  Signed, Rozann Lesches, MD  05/13/2021, 7:58 AM

## 2021-05-13 NOTE — Progress Notes (Addendum)
Debbie Barry 10:30 AM  Subjective: Patient doing well eating a little better no signs of bleeding does have some bladder and rectal pressure when she goes did move her bowels a little a few times and case discussed with her nurse as well  Objective: Signs stable afebrile no acute distress abdomen is soft nontender hemoglobin continues to slowly drop without signs of bleeding BUN and creatinine okay  Assessment: Multiple medical problems including anemia  Plan: We will ask our rounding team to check on this week and please call us sooner if question or problem from a GI standpoint and if constipation continues you might want to decrease narcotics and we could decrease Carafate as well  [place] E  office [phone] After 5PM or if no answer call [phone]

## 2021-05-14 ENCOUNTER — Inpatient Hospital Stay (HOSPITAL_COMMUNITY): Payer: Medicare HMO

## 2021-05-14 DIAGNOSIS — I469 Cardiac arrest, cause unspecified: Secondary | ICD-10-CM | POA: Diagnosis not present

## 2021-05-14 DIAGNOSIS — I4581 Long QT syndrome: Secondary | ICD-10-CM

## 2021-05-14 DIAGNOSIS — D649 Anemia, unspecified: Secondary | ICD-10-CM

## 2021-05-14 DIAGNOSIS — I420 Dilated cardiomyopathy: Secondary | ICD-10-CM

## 2021-05-14 DIAGNOSIS — I4901 Ventricular fibrillation: Secondary | ICD-10-CM | POA: Diagnosis not present

## 2021-05-14 DIAGNOSIS — N280 Ischemia and infarction of kidney: Secondary | ICD-10-CM

## 2021-05-14 LAB — COMPREHENSIVE METABOLIC PANEL
ALT: 21 U/L (ref 0–44)
ALT: 19 U/L (ref 0–44)
AST: 31 U/L (ref 15–41)
AST: 31 U/L (ref 15–41)
Albumin: 1.6 g/dL — ABNORMAL LOW (ref 3.5–5.0)
Albumin: 1.7 g/dL — ABNORMAL LOW (ref 3.5–5.0)
Alkaline Phosphatase: 111 U/L (ref 38–126)
Alkaline Phosphatase: 125 U/L (ref 38–126)
Anion gap: 4 — ABNORMAL LOW (ref 5–15)
Anion gap: 7 (ref 5–15)
BUN: 15 mg/dL (ref 8–23)
BUN: 16 mg/dL (ref 8–23)
CO2: 26 mmol/L (ref 22–32)
CO2: 26 mmol/L (ref 22–32)
Calcium: 8.3 mg/dL — ABNORMAL LOW (ref 8.9–10.3)
Calcium: 8.6 mg/dL — ABNORMAL LOW (ref 8.9–10.3)
Chloride: 102 mmol/L (ref 98–111)
Chloride: 101 mmol/L (ref 98–111)
Creatinine, Ser: 0.53 mg/dL (ref 0.44–1.00)
Creatinine, Ser: 0.53 mg/dL (ref 0.44–1.00)
GFR, Estimated: >60 mL/min (ref >60)
GFR, Estimated: >60 mL/min (ref >60)
Glucose, Bld: 446 mg/dL — ABNORMAL HIGH (ref 70–99)
Glucose, Bld: 102 mg/dL — ABNORMAL HIGH (ref 70–99)
Potassium: 6.2 mmol/L — ABNORMAL HIGH (ref 3.5–5.1)
Potassium: 5.1 mmol/L (ref 3.5–5.1)
Sodium: 132 mmol/L — ABNORMAL LOW (ref 135–145)
Sodium: 134 mmol/L — ABNORMAL LOW (ref 135–145)
Total Bilirubin: <0.1 mg/dL — ABNORMAL LOW (ref 0.3–1.2)
Total Bilirubin: 0.5 mg/dL (ref 0.3–1.2)
Total Protein: 4.2 g/dL — ABNORMAL LOW (ref 6.5–8.1)
Total Protein: 4.4 g/dL — ABNORMAL LOW (ref 6.5–8.1)

## 2021-05-14 LAB — CBC
HCT: 21.5 % — ABNORMAL LOW (ref 36.0–46.0)
Hemoglobin: 7.3 g/dL — ABNORMAL LOW (ref 12.0–15.0)
MCH: 33 pg (ref 26.0–34.0)
MCHC: 34 g/dL (ref 30.0–36.0)
MCV: 97.3 fL (ref 80.0–100.0)
Platelets: 249 10*3/uL (ref 150–400)
RBC: 2.21 MIL/uL — ABNORMAL LOW (ref 3.87–5.11)
RDW: 16.7 % — ABNORMAL HIGH (ref 11.5–15.5)
WBC: 7.6 10*3/uL (ref 4.0–10.5)
nRBC: 0.5 % — ABNORMAL HIGH (ref 0.0–0.2)

## 2021-05-14 LAB — CULTURE, BLOOD (ROUTINE X 2): Culture: NO GROWTH

## 2021-05-14 LAB — GLUCOSE, CAPILLARY
Glucose-Capillary: 105 mg/dL — ABNORMAL HIGH (ref 70–99)
Glucose-Capillary: 121 mg/dL — ABNORMAL HIGH (ref 70–99)
Glucose-Capillary: 138 mg/dL — ABNORMAL HIGH (ref 70–99)
Glucose-Capillary: 143 mg/dL — ABNORMAL HIGH (ref 70–99)
Glucose-Capillary: 143 mg/dL — ABNORMAL HIGH (ref 70–99)
Glucose-Capillary: 145 mg/dL — ABNORMAL HIGH (ref 70–99)
Glucose-Capillary: 146 mg/dL — ABNORMAL HIGH (ref 70–99)

## 2021-05-14 LAB — PHOSPHORUS
Phosphorus: 4.9 mg/dL — ABNORMAL HIGH (ref 2.5–4.6)
Phosphorus: 3.7 mg/dL (ref 2.5–4.6)

## 2021-05-14 LAB — TRIGLYCERIDES
Triglycerides: 325 mg/dL — ABNORMAL HIGH (ref <150)
Triglycerides: 123 mg/dL (ref <150)

## 2021-05-14 LAB — HEMOGLOBIN AND HEMATOCRIT, BLOOD
HCT: 25 % — ABNORMAL LOW (ref 36.0–46.0)
Hemoglobin: 8.9 g/dL — ABNORMAL LOW (ref 12.0–15.0)

## 2021-05-14 LAB — MAGNESIUM
Magnesium: 2.2 mg/dL (ref 1.7–2.4)
Magnesium: 1.7 mg/dL (ref 1.7–2.4)

## 2021-05-14 LAB — PREPARE RBC (CROSSMATCH)

## 2021-05-14 LAB — HEPARIN LEVEL (UNFRACTIONATED): Heparin Unfractionated: 0.46 IU/mL (ref 0.30–0.70)

## 2021-05-14 IMAGING — DX DG ABDOMEN 1V
1 series · 1 of 1 positions shown · non-contrast
Comparison: Abdomen pelvis CT [DATE]

CLINICAL DATA: Abdominal pain and diarrhea.

EXAM:
ABDOMEN - 1 VIEW

[t abdomen supine]
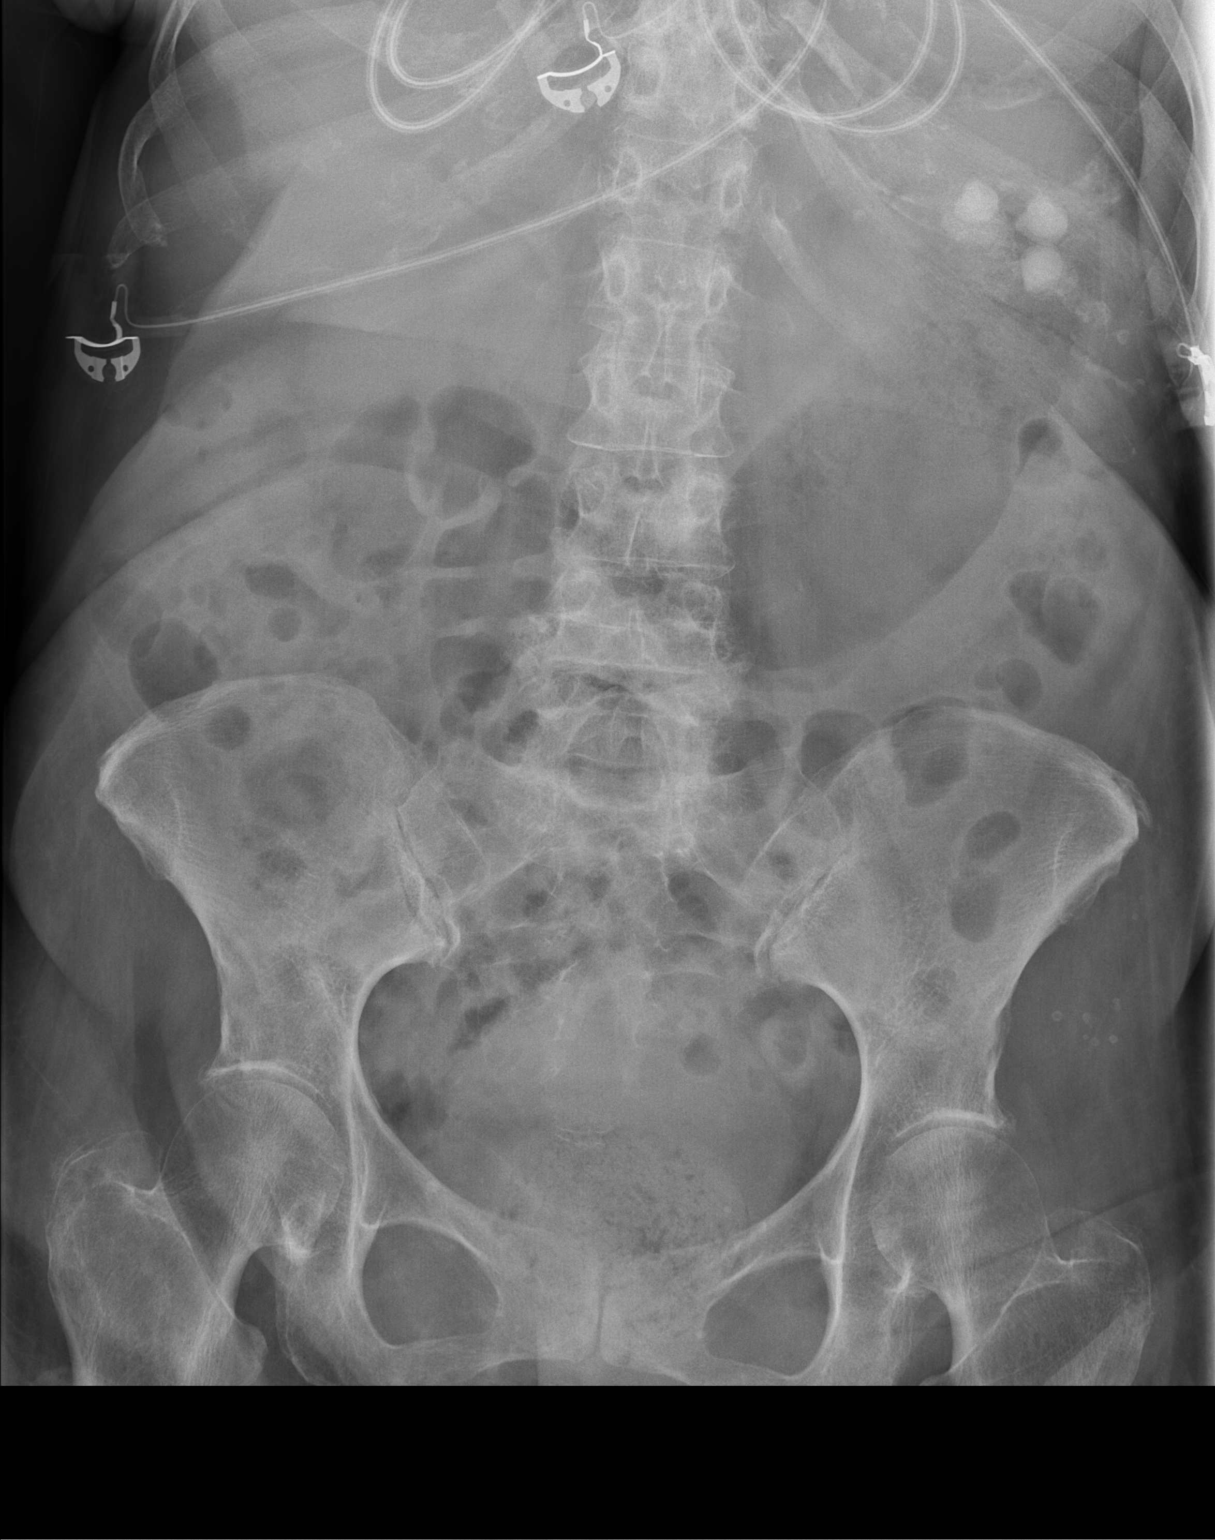

[1 of 1 positions shown; findings below may reference images not displayed]

FINDINGS: Mild gaseous distention of the stomach. Scattered small bowel and
colonic gas evident without obstructive pattern. Prominent stool
volume in the rectum. Cluster of radiodensities in the left upper
quadrant are presumably related to ingested material in the stomach
as no calcifications are seen in the left abdomen on recent CT scan.
IMPRESSION: 1. Nonobstructive bowel gas pattern.
2. Mild gaseous distention of the stomach.
3. Prominent stool volume in the rectum.

## 2021-05-14 MED ORDER — VITAMIN A 3 MG (10000 UNIT) PO CAPS
10000.0000 [IU] | ORAL_CAPSULE | Freq: Every day | ORAL | Status: DC
  Administered 2021-05-14 – 2021-05-22 (×9): 10000 [IU] via ORAL
  Filled 2021-05-14 (×9): qty 1

## 2021-05-14 MED ORDER — TRACE MINERALS CU-MN-SE-ZN 300-55-60-3000 MCG/ML IV SOLN
INTRAVENOUS | Status: AC
  Filled 2021-05-14: qty 336

## 2021-05-14 MED ORDER — NADOLOL 20 MG PO TABS
20.0000 mg | ORAL_TABLET | Freq: Every day | ORAL | Status: DC
  Administered 2021-05-14 – 2021-05-22 (×9): 20 mg via ORAL
  Filled 2021-05-14 (×9): qty 1

## 2021-05-14 MED ORDER — MAGNESIUM SULFATE 2 GM/50ML IV SOLN
2.0000 g | Freq: Once | INTRAVENOUS | Status: AC
Start: 2021-05-14 — End: 2021-05-14
  Administered 2021-05-14: 2 g via INTRAVENOUS
  Filled 2021-05-14: qty 50

## 2021-05-14 MED ORDER — GERHARDT'S BUTT CREAM
TOPICAL_CREAM | Freq: Three times a day (TID) | CUTANEOUS | Status: DC | PRN
  Filled 2021-05-14: qty 1

## 2021-05-14 MED ORDER — BISACODYL 10 MG RE SUPP: 10.0000 mg | Freq: Every day | RECTAL | Status: DC | PRN

## 2021-05-14 MED ORDER — BOOST / RESOURCE BREEZE PO LIQD CUSTOM
1.0000 | Freq: Three times a day (TID) | ORAL | Status: DC
Start: 2021-05-14 — End: 2021-05-22
  Administered 2021-05-14 – 2021-05-22 (×17): 1 via ORAL

## 2021-05-14 MED ORDER — DIPHENHYDRAMINE HCL 25 MG PO CAPS
25.0000 mg | ORAL_CAPSULE | Freq: Every evening | ORAL | Status: DC | PRN
  Administered 2021-05-14 – 2021-05-21 (×8): 25 mg via ORAL
  Filled 2021-05-14 (×8): qty 1

## 2021-05-14 MED ORDER — ALBUMIN HUMAN 25 % IV SOLN
25.0000 g | Freq: Once | INTRAVENOUS | Status: AC
  Administered 2021-05-14: 25 g via INTRAVENOUS
  Filled 2021-05-14: qty 100

## 2021-05-14 MED ORDER — SODIUM CHLORIDE 0.9% IV SOLUTION: Freq: Once | INTRAVENOUS | Status: AC

## 2021-05-14 NOTE — Progress Notes (Signed)
Nutrition Follow-up  DOCUMENTATION CODES:   Severe malnutrition in context of chronic illness  INTERVENTION:   - TPN per Pharmacy, recommend holding off from weaning further until pt meeting >60% of kcal and protein needs via PO route  - Recommend liberalizing diet to Regular, reached out to MD via secure chat and awaiting response  - Boost Breeze po TID, each supplement provides 250 kcal and 9 grams of protein  - Replete vitamin A with 10,000 units vitamin A daily x 14 days  - Encourage PO intake  NUTRITION DIAGNOSIS:   Severe Malnutrition related to chronic illness (chronic N/V/D) as evidenced by severe muscle depletion, energy intake < or equal to 50% for > or equal to 1 month.  Ongoing  GOAL:   Patient will meet greater than or equal to 90% of their needs  Progressing  MONITOR:   PO intake, Supplement acceptance, Labs  REASON FOR ASSESSMENT:   Consult Assessment of nutrition requirement/status  ASSESSMENT:   70 yo female admitted with DVT R common femoral vein and R femoral vein, acute on chronic CHF with anasarca. PMH includes HTN, PAD, fatty liver, chronic back pain, spinal stenosis, PVD, iron overload, gastritis, gastric ulcers.  11/03 - s/p R thoracentesis   11/07 - s/p V tach/V fib arrest s/p CPR x 3 minutes 11/09 - TPN initiated, Cortrak placement attempted but pt unable to tolerate 11/11 - TPN increased to goal, s/p R/L heart cath showing severely depressed LV 11/12 - TPN reduced to 75% goal rate 11/13 - TPN reduced to 50% goal rate  Per notes, pt with severe hypoalbuminemia, anasarca associated with N/V/D and weight loss suspected to be secondary to protein-losing enteropathy. Pt with negative C diff and GI panel. Pt also negative for Celiac disease and giardia and with normal alpha 1 antitrypsin and normal copper level.  Noted plan to continue reduced concentrated TPN today. TPN infusing at 30 ml/hr which is ~50% of goal rate and meets ~50% of pt's  estimated kcal and protein needs.  Noted vitamin A level low. MD okay with repleting. Ordered 10,000 units vitamin A daily x 14 days.  Spoke with pt at bedside. Pt reports appetite remains poor but that she is trying to eat. RD assisted pt in getting set up for lunch (opening containers, cutting food, etc.). Pt appreciative. Pt does not like Ensure supplements but willing to drink Boost Breeze. RD to order.  Admit weight: 65.7 kg Current weight: 57.6 kg  Meal Completion: 0-50% x 4 meals since 11/08 (multiple meal completions missing)  Medications reviewed and include: IV lasix 80 mg BID, megace 400 mg BID, klor-con 40 mEq BID, spironolactone, sucralfate 1 gram BID, thiamine 100 mg daily, IV albumin 25 grams once, heparin gtt, IV magnesium sulfate 2 grams once, TPN @ 30 ml/hr  Vitamin/Mineral Profile:  Thiamine B1: 149.5 (WNL) Vitamin B12: 743 (WNL) Folate B9: 4.5 (WNL) Vitamin A: 14.0 (low) Vitamin D: 87.74 (WNL) Vitamin C: 0.9 (WNL) Copper: 80 (WNL) Vitamin E (alpha tocopherol): 9.9 (WNL) Vitamin E (gamma tocopherol): 0.7 (WNL) Zinc: 46 (WNL)  Labs reviewed: sodium 134, hemoglobin 7.3 CBG's: 105-138 x 24 hours  UOP: 600 ml x 24 hours I/O's: -10.0 L since admit  Diet Order:   Diet Order             Diet Heart Room service appropriate? Yes; Fluid consistency: Thin  Diet effective now  EDUCATION NEEDS:   No education needs have been identified at this time  Skin:  Skin Assessment: (non-pressure: sacrum, L buttocks) Skin Integrity Issues: Other: pretibial venous stasis ulcer  Last BM:  05/14/21 small type 6  Height:   Ht Readings from Last 1 Encounters:  05/02/21 5\' 1"  (1.549 m)    Weight:   Wt Readings from Last 1 Encounters:  05/14/21 57.6 kg    BMI:  Body mass index is 23.99 kg/m.  Estimated Nutritional Needs:   Kcal:  1800-2000  Protein:  105-120  Fluid:  >1.8 L/day    Gustavus Bryant, MS, RD, LDN Inpatient Clinical  Dietitian Please see [facility] for contact information.

## 2021-05-14 NOTE — Progress Notes (Signed)
Primary Cardologist:  Oneal  Subjective:   Some atypical chest discomfort when moving in bed   Objective:  Vitals:   05/14/21 0338 05/14/21 0343 05/14/21 0730 05/14/21 0838  BP:  (!) 99/55 115/62 111/61  Pulse:  89 (!) 101 97  Resp:  16 15   Temp:  98.8 F (37.1 C)    TempSrc:  Oral    SpO2:  97% 96%   Weight: 56.5 kg  57.6 kg   Height:        Intake/Output from previous day:  Intake/Output Summary (Last 24 hours) at 05/14/2021 1044 Last data filed at 05/14/2021 0849 Gross per 24 hour  Intake 1405.06 ml  Output 400 ml  Net 1005.06 ml    Physical Exam: Chronically ill black female No murmur  Lungs clear anteriorly Plus one edema BS positive    Lab Results: Basic Metabolic Panel: Recent Labs    05/14/21 0400 05/14/21 0554  NA 132* 134*  K 6.2* 5.1  CL 102 101  CO2 26 26  GLUCOSE 446* 102*  BUN 15 16  CREATININE 0.53 0.53  CALCIUM 8.3* 8.6*  MG 2.2 1.7  PHOS 4.9* 3.7   Liver Function Tests: Recent Labs    05/14/21 0400 05/14/21 0554  AST 31 31  ALT 21 19  ALKPHOS 111 125  BILITOT <0.1* 0.5  PROT 4.2* 4.4*  ALBUMIN 1.6* 1.7*   No results for input(s): LIPASE, AMYLASE in the last 72 hours. CBC: Recent Labs    05/13/21 0455 05/14/21 0400  WBC 7.2 7.6  HGB 7.3* 7.3*  HCT 20.2* 21.5*  MCV 93.5 97.3  PLT 194 249    Fasting Lipid Panel: Recent Labs    05/14/21 0554  TRIG 123     Imaging: DG Abd 1 View  Result Date: 05/14/2021 CLINICAL DATA:  Abdominal pain and diarrhea. EXAM: ABDOMEN - 1 VIEW COMPARISON:  Abdomen pelvis CT 05/02/2021 FINDINGS: Mild gaseous distention of the stomach. Scattered small bowel and colonic gas evident without obstructive pattern. Prominent stool volume in the rectum. Cluster of radiodensities in the left upper quadrant are presumably related to ingested material in the stomach as no calcifications are seen in the left abdomen on recent CT scan. IMPRESSION: 1. Nonobstructive bowel gas pattern. 2. Mild  gaseous distention of the stomach. 3. Prominent stool volume in the rectum. Electronically Signed   By: Misty Stanley M.D.   On: 05/14/2021 10:30    Cardiac Studies:  ECG: SR QT still long 398/500 msec    Telemetry:NSR rate PVC no further PMVT  Echo: EF decreased to 25-30%   Medications:    sodium chloride   Intravenous Once   carvedilol  6.25 mg Oral BID WC   Chlorhexidine Gluconate Cloth  6 each Topical Daily   furosemide  80 mg Intravenous Q12H   losartan  25 mg Oral Daily   mouth rinse  15 mL Mouth Rinse BID   megestrol  400 mg Oral BID   potassium chloride  40 mEq Oral BID   sodium chloride flush  10-40 mL Intracatheter Q12H   sodium chloride flush  3 mL Intravenous Q12H   spironolactone  25 mg Oral Daily   sucralfate  1 g Oral BID   thiamine  100 mg Oral Daily   vitamin A  10,000 Units Oral Daily      sodium chloride 10 mL/hr at 05/11/21 2100   albumin human     heparin 700 Units/hr (05/14/21 0530)  magnesium sulfate bolus IVPB     TPN ADULT (ION) 30 mL/hr at 05/14/21 0530   TPN ADULT (ION)      Assessment/Plan:   DCM:  previous EF 40-45% 05/07/21 post arrest 25-30% cath with no obstructive CAD continue lasix coreg aldactone and losratin volume status improved PMVT:  in setting of low Mg/K. Now in normal range Also in setting of bradycardia and PVC HR now higher with persistent QT prolongation Given arrest and drop in EF with no culprit lesion on cath have asked EP to see regarding AICD ( she looks a lot older than she is ) and QT also see below Renal infarct:  ? Etiology In NSR ? ILR to r/o PAF   Jenkins Rouge 05/14/2021, 10:44 AM

## 2021-05-14 NOTE — Progress Notes (Signed)
PHARMACY - TOTAL PARENTERAL NUTRITION CONSULT NOTE   Indication:  Intolerance to po intake, concern for malabsorption  Patient Measurements: Height: 5\' 1"  (154.9 cm) Weight: 56.5 kg (124 lb 9 oz) IBW/kg (Calculated) : 47.8 TPN AdjBW (KG): 52.3 Body mass index is 23.54 kg/m. Usual Weight: 60-65 kg  Assessment:  70 yo F admitted for DVT, acute on chronic CHF with anasarca. Patient had a cardiac arrest 11/9 likely due to electrolyte abnormalities. Patient's condition acutely worsened overnight 11/9 with concern for volume overload, N/V, and epigastric pain. Undigested/whole medications in bowel movements concerning for malabsorption. Noted limited po intake since Feb 2022.  Glucose / Insulin: no hx DM. CBGs <180, utilized 3 units sSSI in last 24hrs Electrolytes:  Na 134, K up to 5.1 (6.2 likely lab error, s/p KCl 32mEq PO x 2 doses yesterday), CL/CO2 normalized, Phos 3.7 (s/p KPhos 4mmol yesterday), Mg 1.7 Renal:  Scr 0.53 (stable), BUN WNL UOP 600 ml/24hr + 3 occurences. Lasix drip to Lasix 80mg  IV q12h on 11/12 Hepatic: LFTs / Tbili / TG WNL, albumin 1.6 Intake / Output; MIVF: LBM 11/13 GI Imaging:11/3 CT abdomen: Mild ascites, marked diffuse body wall subQ edema GI Surgeries / Procedures: Vitamins: 11/8 Copper 80 (WNL), Ceruloplasmin 16.9 (low), Vitamin B1 149.5 (WNL), Vitamin D 87.7 (WNL), Vitamin B6 4.5 (WNL), Zinc 46 (WNL)  Central access: PICC placed 11/7 TPN start date: 11/9  Nutritional Goals: Goal TPN rate 65 ml/hr (109gm AA, 1863 kcal)  RD Assessment: Estimated Needs Total Energy Estimated Needs: 1800-2000 Total Protein Estimated Needs: 105-120 Total Fluid Estimated Needs: >1.8 L/day  Current Nutrition:  TPN (weaned to 50% goal rate 11/13) Heart diet started 11/11 - poor appetite, did not eat much breakfast  11/10- Unable to tolerate feeding tube placement/attempted placement of postpyloric feeding tube (only advanced up to the stomach), was removed  subsequently. 11/10 States she has marked improvement in nausea, able to tolerate some PO 11/12 Minimal PO intake due to constipation, cholestyramine d/c'd and had BM 11/13 Feeling much better and appetite better per RN, ate ~75% of breakfast   Plan:  -Continue reduced concentrated TPN today per discussion with Dr. Cyndia Skeeters - TPN at 30 ml/hr (~50% goal rate) at 1800. Plan to d/c TPN tomorrow if adequate PO intake. -Electrolytes in TPN: increase Na to 90 mEq/L, K 50 mEq/L, Ca 5 mEq/L, Mag 15 mEq/L, and increase Phos to 15 mmol/L. Cl:Ac at max Cl -Continue scheduled K 40 mEq BID while on scheduled Lasix IV (continuing same dose today per Cards) -Add standard MVI and trace elements to TPN -Discontinue SSI with minimal usage -Mg 2 g IV x1 -Vitamin levels WNL - no additional replacement needed -Monitor TPN labs, BMET in am, watch electrolytes closely -F/u PO intake/tolerance and ability to d/c TPN tomorrow  Thank you for involving pharmacy in this patient's care.  Renold Genta, PharmD, BCPS Clinical Pharmacist Clinical phone for 05/14/2021 until 3p is [phone] 05/14/2021 7:12 AM  **Pharmacist phone directory can be found on [email or web address] listed under [email or web address]**

## 2021-05-14 NOTE — Progress Notes (Signed)
MD on-call paged and notified of pt's lab results. New order received for stat lab re-collection. Will continue to closely monitor pt. Delia Heady RN

## 2021-05-14 NOTE — Progress Notes (Signed)
Subjective: Patient was moving from bed to chair with help.  Not on oxygen at this time, doing well.  Reports she feels much better. Has had improvement in breathing, feels less swollen.  She still has her appetite. Denies nausea and vomiting.  She was having more constipation over the weekend, over night had frequent small volume loose stools, bristol 6 per nurse.   Objective: Vital signs in last 24 hours: Temp:  [98.8 F (37.1 C)-99.8 F (37.7 C)] 98.8 F (37.1 C) (11/14 0343) Pulse Rate:  [87-101] 97 (11/14 0838) Resp:  [13-19] 15 (11/14 0730) BP: (99-130)/(55-81) 111/61 (11/14 0838) SpO2:  [95 %-99 %] 96 % (11/14 0730) Weight:  [56.5 kg-57.6 kg] 57.6 kg (11/14 0730) Weight change: 4.7 kg Last BM Date: 05/13/21  PE: NAD, sitting in chair, not using accessory muscles of respiration, complete sentences GENERAL: Puffy face  ABDOMEN: Non distended, soft, mild right tenderness noted, normoactive bowel sounds EXTREMITIES: mild edema nonpitting   Lab Results: Results for orders placed or performed during the hospital encounter of 05/02/21 (from the past 48 hour(s))  Glucose, capillary     Status: Abnormal   Collection Time: 05/12/21 11:58 AM  Result Value Ref Range   Glucose-Capillary 134 (H) 70 - 99 mg/dL    Comment: Glucose reference range applies only to samples taken after fasting for at least 8 hours.  Basic metabolic panel     Status: Abnormal   Collection Time: 05/12/21  4:20 PM  Result Value Ref Range   Sodium 134 (L) 135 - 145 mmol/L   Potassium 3.6 3.5 - 5.1 mmol/L   Chloride 96 (L) 98 - 111 mmol/L   CO2 32 22 - 32 mmol/L   Glucose, Bld 108 (H) 70 - 99 mg/dL    Comment: Glucose reference range applies only to samples taken after fasting for at least 8 hours.   BUN 10 8 - 23 mg/dL   Creatinine, Ser 0.42 (L) 0.44 - 1.00 mg/dL   Calcium 7.7 (L) 8.9 - 10.3 mg/dL   GFR, Estimated >60 >60 mL/min    Comment: (NOTE) Calculated using the CKD-EPI Creatinine Equation  (2021)    Anion gap 6 5 - 15    Comment: Performed at [facility] [address], [place], [place] [address]  Magnesium     Status: None   Collection Time: 05/12/21  4:20 PM  Result Value Ref Range   Magnesium 2.1 1.7 - 2.4 mg/dL    Comment: Performed at [facility], [place] [address] [place], [place] [address]  CBC     Status: Abnormal   Collection Time: 05/13/21  4:55 AM  Result Value Ref Range   WBC 7.2 4.0 - 10.5 K/uL   RBC 2.16 (L) 3.87 - 5.11 MIL/uL   Hemoglobin 7.3 (L) 12.0 - 15.0 g/dL   HCT 20.2 (L) 36.0 - 46.0 %   MCV 93.5 80.0 - 100.0 fL   MCH 33.8 26.0 - 34.0 pg   MCHC 36.1 (H) 30.0 - 36.0 g/dL   RDW 16.4 (H) 11.5 - 15.5 %   Platelets 194 150 - 400 K/uL   nRBC 0.0 0.0 - 0.2 %    Comment: Performed at [facility], [place] [address] [place], [place] [address]  Basic metabolic panel     Status: Abnormal   Collection Time: 05/13/21  4:55 AM  Result Value Ref Range   Sodium 133 (L) 135 - 145 mmol/L   Potassium 3.8 3.5 -  5.1 mmol/L   Chloride 98 98 - 111 mmol/L   CO2 31 22 - 32 mmol/L   Glucose, Bld 113 (H) 70 - 99 mg/dL    Comment: Glucose reference range applies only to samples taken after fasting for at least 8 hours.   BUN 13 8 - 23 mg/dL   Creatinine, Ser 0.45 0.44 - 1.00 mg/dL   Calcium 7.7 (L) 8.9 - 10.3 mg/dL   GFR, Estimated >60 >60 mL/min    Comment: (NOTE) Calculated using the CKD-EPI Creatinine Equation (2021)    Anion gap 4 (L) 5 - 15    Comment: Performed at [facility] [address] [place], [place] [address]  Heparin level (unfractionated)     Status: None   Collection Time: 05/13/21  4:55 AM  Result Value Ref Range   Heparin Unfractionated 0.67 0.30 - 0.70 IU/mL    Comment: (NOTE) The clinical reportable range upper limit is being lowered to >1.10 to align with the FDA approved guidance for the current laboratory assay.  If heparin results are below expected values, and patient dosage has  been confirmed, suggest  follow up testing of antithrombin III levels. Performed at [facility], [place] [address] [place], [place] [address]   APTT     Status: Abnormal   Collection Time: 05/13/21  4:55 AM  Result Value Ref Range   aPTT 103 (H) 24 - 36 seconds    Comment:        IF BASELINE aPTT IS ELEVATED, SUGGEST PATIENT RISK ASSESSMENT BE USED TO DETERMINE APPROPRIATE ANTICOAGULANT THERAPY. Performed at [facility], [place] [address], [place], [place] [address]   Magnesium     Status: None   Collection Time: 05/13/21  4:55 AM  Result Value Ref Range   Magnesium 2.0 1.7 - 2.4 mg/dL    Comment: Performed at [facility] [address], [place], [place] [address]  Phosphorus     Status: Abnormal   Collection Time: 05/13/21  4:55 AM  Result Value Ref Range   Phosphorus 2.2 (L) 2.5 - 4.6 mg/dL    Comment: Performed at [facility] [address] [place], [place] [address]  Glucose, capillary     Status: Abnormal   Collection Time: 05/13/21  6:26 AM  Result Value Ref Range   Glucose-Capillary 118 (H) 70 - 99 mg/dL    Comment: Glucose reference range applies only to samples taken after fasting for at least 8 hours.  Glucose, capillary     Status: Abnormal   Collection Time: 05/14/21  1:00 AM  Result Value Ref Range   Glucose-Capillary 138 (H) 70 - 99 mg/dL    Comment: Glucose reference range applies only to samples taken after fasting for at least 8 hours.  CBC     Status: Abnormal   Collection Time: 05/14/21  4:00 AM  Result Value Ref Range   WBC 7.6 4.0 - 10.5 K/uL   RBC 2.21 (L) 3.87 - 5.11 MIL/uL   Hemoglobin 7.3 (L) 12.0 - 15.0 g/dL   HCT 21.5 (L) 36.0 - 46.0 %   MCV 97.3 80.0 - 100.0 fL   MCH 33.0 26.0 - 34.0 pg   MCHC 34.0 30.0 - 36.0 g/dL   RDW 16.7 (H) 11.5 - 15.5 %   Platelets 249 150 - 400 K/uL   nRBC 0.5 (H) 0.0 - 0.2 %    Comment: Performed at [facility]  [address], [place], [place] [address]  Comprehensive metabolic panel     Status: Abnormal    Collection Time: 05/14/21  4:00 AM  Result Value Ref Range   Sodium 132 (L) 135 - 145 mmol/L   Potassium 6.2 (H) 3.5 - 5.1 mmol/L    Comment: NO VISIBLE HEMOLYSIS   Chloride 102 98 - 111 mmol/L   CO2 26 22 - 32 mmol/L   Glucose, Bld 446 (H) 70 - 99 mg/dL    Comment: Glucose reference range applies only to samples taken after fasting for at least 8 hours.   BUN 15 8 - 23 mg/dL   Creatinine, Ser 0.53 0.44 - 1.00 mg/dL   Calcium 8.3 (L) 8.9 - 10.3 mg/dL   Total Protein 4.2 (L) 6.5 - 8.1 g/dL   Albumin 1.6 (L) 3.5 - 5.0 g/dL   AST 31 15 - 41 U/L   ALT 21 0 - 44 U/L   Alkaline Phosphatase 111 38 - 126 U/L   Total Bilirubin <0.1 (L) 0.3 - 1.2 mg/dL   GFR, Estimated >60 >60 mL/min    Comment: (NOTE) Calculated using the CKD-EPI Creatinine Equation (2021)    Anion gap 4 (L) 5 - 15    Comment: Performed at [facility], [place] [address], [place], [place] [address]  Magnesium     Status: None   Collection Time: 05/14/21  4:00 AM  Result Value Ref Range   Magnesium 2.2 1.7 - 2.4 mg/dL    Comment: Performed at [facility] [address] [place], [place] [address]  Phosphorus     Status: Abnormal   Collection Time: 05/14/21  4:00 AM  Result Value Ref Range   Phosphorus 4.9 (H) 2.5 - 4.6 mg/dL    Comment: Performed at [facility] [address] [place], [place] [address]  Triglycerides     Status: Abnormal   Collection Time: 05/14/21  4:00 AM  Result Value Ref Range   Triglycerides 325 (H) <150 mg/dL    Comment: Performed at [facility] [address] [place], [place] [address]  Heparin level (unfractionated)     Status: None   Collection Time: 05/14/21  5:54 AM  Result Value Ref Range   Heparin Unfractionated 0.46 0.30 - 0.70 IU/mL    Comment: (NOTE) The clinical reportable range upper limit is being lowered to >1.10 to align with the FDA approved guidance for the current laboratory assay.  If heparin results are below expected values, and patient  dosage has  been confirmed, suggest follow up testing of antithrombin III levels. Performed at [facility], [place] [address] [place],  [address]   Comprehensive metabolic panel     Status: Abnormal   Collection Time: 05/14/21  5:54 AM  Result Value Ref Range   Sodium 134 (L) 135 - 145 mmol/L   Potassium 5.1 3.5 - 5.1 mmol/L   Chloride 101 98 - 111 mmol/L   CO2 26 22 - 32 mmol/L   Glucose, Bld 102 (H) 70 - 99 mg/dL    Comment: Glucose reference range applies only to samples taken after fasting for at least 8 hours.   BUN 16 8 - 23 mg/dL   Creatinine, Ser 0.53 0.44 - 1.00 mg/dL   Calcium 8.6 (L) 8.9 - 10.3 mg/dL   Total Protein 4.4 (L) 6.5 - 8.1 g/dL   Albumin 1.7 (L) 3.5 - 5.0 g/dL   AST 31 15 - 41 U/L  ALT 19 0 - 44 U/L   Alkaline Phosphatase 125 38 - 126 U/L   Total Bilirubin 0.5 0.3 - 1.2 mg/dL   GFR, Estimated >60 >60 mL/min    Comment: (NOTE) Calculated using the CKD-EPI Creatinine Equation (2021)    Anion gap 7 5 - 15    Comment: Performed at [facility] [address] [place], [place] [address]  Phosphorus     Status: None   Collection Time: 05/14/21  5:54 AM  Result Value Ref Range   Phosphorus 3.7 2.5 - 4.6 mg/dL    Comment: Performed at [facility] [address], [place], [place] [address]  Magnesium     Status: None   Collection Time: 05/14/21  5:54 AM  Result Value Ref Range   Magnesium 1.7 1.7 - 2.4 mg/dL    Comment: Performed at [facility] [address] [place], [place] [address]  Triglycerides     Status: None   Collection Time: 05/14/21  5:54 AM  Result Value Ref Range   Triglycerides 123 <150 mg/dL    Comment: Performed at [facility] [address] [place], [place] [address]  Glucose, capillary     Status: Abnormal   Collection Time: 05/14/21  7:06 AM  Result Value Ref Range   Glucose-Capillary 105 (H) 70 - 99 mg/dL    Comment: Glucose reference range applies only to samples taken after fasting for at  least 8 hours.    Studies/Results: No results found.  Medications: I have reviewed the patient's current medications.  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   Ventricular tachycardia, cardiac arrest, QT prolongation, systolic heart failure with EF of 25 to 30% Right and left cath - non obstructive CAD, nonischemic cardiomyopathy.  Acute hypoxic respiratory failure  Plan: Patient unable to tolerate postpyloric feeding tube placement,on IV TPN for nutritional support. IR guided feeding tube placement in the future if patient unable to meet nutritional needs orally but patient is improving with eating status at this time. Continue to monitor.  Patient taken off cholestyramine over the weekend due to constipation.   Will get KUB to evaluate stool burden with small frequent formed stools which is different from what patient came in with.  And restart cholestyramine half a packet a day Carafate decreased to twice a day from 4-day.  Will need prolonged nutritional support with low-fat, high-protein, high medium change triglycerides containing diet to improve low albumin. Hemoglobin staying steady at 7.3, reviewing in's and outs patient is receiving more net intake-could be delusional component, no evidence of bleeding at this time.  Continue to monitor.   05/14/2021, 9:15 AM

## 2021-05-14 NOTE — Progress Notes (Signed)
Physical Therapy Treatment Patient Details Name: Debbie Barry MRN: [id number] DOB: June 21, 1951 Today's Date: 05/14/2021   History of Present Illness Pt adm 11/2 with chest pain and found to have lt common iliac DVT. Pt also found to have acute rt renal infarct, acute on chronic diastolic heart failure and large bilateral pleural effusions. On 11/7 pt suffered a vfib/vtach arrest and received CPR and shock.  PMH - spinal stenosis, chronic back pain, obesity, chf, htn, pvd, pt with recent hospital admission with BLE weakness and found to have chronic L3-4 compression fx and severe L4-5 canal stenosis.    PT Comments    The pt continues to make very slow progress towards goals at this time. She requires modA of 2 to complete bed mobility, static sitting at EOB, and all OOB transfers. She relies heavily on assist from staff to complete sit-stand transfers, very limited initiation of movement from pt until assisted at this time. The pt's vitals remained stable with all activity (HR 100-107bpm, SpO2 97% on RA), but she reported significant fatigue and inability to complete more than a single transfer despite significant bouts of rest. Continued discussion with pt about how her current level of assist is more than her elderly spouse can provide but she remains adamant she does not want to return to SNF. Will need constant supervision and physical assist for any movement.     Recommendations for follow up therapy are one component of a multi-disciplinary discharge planning process, led by the attending physician.  Recommendations may be updated based on patient status, additional functional criteria and insurance authorization.  Follow Up Recommendations  Home health PT (if family able to provide assist for transfers and all mobility)     Assistance Recommended at Discharge Frequent or constant Supervision/Assistance  Equipment Recommendations  Other (comment) (lift, pt reports WC has already been  delivered to home)    Recommendations for Other Services       Precautions / Restrictions Precautions Precautions: Fall;Other (comment) Precaution Comments: pt with diarrhea Required Braces or Orthoses: Spinal Brace Spinal Brace: Lumbar corset Other Brace: Pt had a brace from last admission but not present in the room. Stated she used some when up. Restrictions Weight Bearing Restrictions: No     Mobility  Bed Mobility Overal bed mobility: Needs Assistance Bed Mobility: Supine to Sit     Supine to sit: Mod assist     General bed mobility comments: significant increased time to complete movement of BLE towards EOB, then max cues and modA to elevate trunk. pt maintaining slumped posture and needing intermittent assist for static sitting EOB. mod-maxA to scoot due to poor pt initiation of movement    Transfers Overall transfer level: Needs assistance Equipment used: Rolling walker (2 wheels) Transfers: Sit to/from Stand;Bed to chair/wheelchair/BSC Sit to Stand: Mod assist;+2 physical assistance     Step pivot transfers: Mod assist;+2 physical assistance     General transfer comment: modA to complete with pt not initiating movement despite use of momentum or multimodal cues. pt waiting on assist from therapist and RA    Ambulation/Gait Ambulation/Gait assistance: Min assist;+2 physical assistance Gait Distance (Feet): 5 Feet Assistive device: Rolling walker (2 wheels) Gait Pattern/deviations: Step-to pattern;Decreased stride length;Trunk flexed Gait velocity: decreased Gait velocity interpretation: <1.31 ft/sec, indicative of household ambulator   General Gait Details: pt with short strides, trunk flexion, and needing max cues and encouragement to attempt. pt continues to try to sit before arriving to chair  Balance Overall balance assessment: Needs assistance Sitting-balance support: No upper extremity supported;Feet unsupported Sitting balance-Leahy Scale:  Poor Sitting balance - Comments: pt with significant trunk flexion, leaning R or L to prop on arms, unable to sit up without assist. Postural control: Right lateral lean;Left lateral lean Standing balance support: Bilateral upper extremity supported;During functional activity Standing balance-Leahy Scale: Poor Standing balance comment: dependent on BUE support and assist                            Cognition Arousal/Alertness: Awake/alert Behavior During Therapy: Flat affect Overall Cognitive Status: Impaired/Different from baseline Area of Impairment: Following commands;Problem solving;Awareness;Safety/judgement                       Following Commands: Follows one step commands inconsistently;Follows one step commands with increased time Safety/Judgement: Decreased awareness of safety;Decreased awareness of deficits Awareness: Intellectual Problem Solving: Slow processing;Decreased initiation;Difficulty sequencing;Requires verbal cues General Comments: pt needing increased cues, encouragement, and increased time this session, unable or unwilling to generate motion without max cues and often facilitation.        Exercises      General Comments General comments (skin integrity, edema, etc.): VSS on RA      Pertinent Vitals/Pain Pain Assessment: Faces Faces Pain Scale: Hurts little more Pain Location: back soreness, itching LE Pain Descriptors / Indicators: Grimacing;Discomfort;Moaning Pain Intervention(s): Limited activity within patient's tolerance;Monitored during session;Repositioned;RN gave pain meds during session     PT Goals (current goals can now be found in the care plan section) Acute Rehab PT Goals Patient Stated Goal: go home PT Goal Formulation: With patient Time For Goal Achievement: 05/18/21 Potential to Achieve Goals: Fair Progress towards PT goals: Progressing toward goals (very slow progress)    Frequency    Min 3X/week       PT Plan Current plan remains appropriate (if family able to provide assist for transfers and all mobility)       AM-PAC PT "6 Clicks" Mobility   Outcome Measure  Help needed turning from your back to your side while in a flat bed without using bedrails?: A Lot Help needed moving from lying on your back to sitting on the side of a flat bed without using bedrails?: A Lot Help needed moving to and from a bed to a chair (including a wheelchair)?: Total Help needed standing up from a chair using your arms (e.g., wheelchair or bedside chair)?: Total Help needed to walk in hospital room?: Total Help needed climbing 3-5 steps with a railing? : Total 6 Click Score: 8    End of Session Equipment Utilized During Treatment: Gait belt Activity Tolerance: Patient limited by fatigue Patient left: with call bell/phone within reach;in chair;with chair alarm set Nurse Communication: Mobility status PT Visit Diagnosis: Difficulty in walking, not elsewhere classified (R26.2);Muscle weakness (generalized) (M62.81);History of falling (Z91.81)     Time: 3474-2595 PT Time Calculation (min) (ACUTE ONLY): 43 min  Charges:  $Gait Training: 8-22 mins $Therapeutic Activity: 23-37 mins                     West Carbo, PT, DPT   Acute Rehabilitation Department Pager #: [phone]   Sandra Cockayne 05/14/2021, 9:42 AM

## 2021-05-14 NOTE — Consult Note (Addendum)
ELECTROPHYSIOLOGY CONSULT NOTE    Patient ID: Allure Greaser MRN: [id number], DOB/AGE: [birth year] 70 y.o.  Admit date: 05/02/2021 Date of Consult: 05/14/2021  Primary Physician: Glendale Chard, MD Primary Cardiologist: Evalina Field, MD  Electrophysiologist:  New  Referring Provider: Dr. Johnsie Cancel  Patient Profile: Johnda Billiot is a 70 y.o. female with a history of HTN, spinal stenosis, chronic back pain, general weakness, and physical debility, protein malnutrition, PUD, h/o iron overload who is being seen today for the evaluation of PMVT at the request of Dr. Johnsie Cancel.  HPI:  Nysa Sarin is a 70 y.o. female with complicated medical history as above.   Ms. Messineo was hospitalized 10/20-10/26 with LE weakness/numbness and falls. Dx w/ spinal stenosis, no surgery indicated, given LCL brace and steroid taper. She had prolonged QTc and multifocal atrial tach >> keep K+ >4 and Mg >2. Foley placed for urinary retention. D/c to SNF due to weakness and family not able to provide level of care. Admitted 10/01-10/05 with N&V, diarrhea, hypotension. Leg wound noted, no infection. ABIs mildly decreased on R, TBIs decreased bilaterally.   She presented to [facility]/02/2021 with chest discomfort and palpitations. She has had similar self-limited symptoms in the past.  Reports HR was in the 130s at the time. She was given oral metoprolol and 2 doses of NTG with symptoms resolution. Denied fever, chills, cough, and her dyspnea and symptoms improved prior to arrival at ED.   Upon arrival to the ED, patient is found to be afebrile, saturating well on room air, and with heart rate in the upper 90s to low 100s.  EKG features sinus tachycardia with rate 101, T wave inversions, and QTC 540 ms.  Chemistry panel notable for sodium 132 and albumin 1.5.  CBC with mild normocytic anemia.  CTA chest negative for PE but notable for mild cardiomegaly and large bilateral pleural effusions.  CT of the  abdomen and pelvis reveals DVT within the left common iliac vein and acute right renal infarction, as well as marked diffuse body wall subcutaneous edema.  Patient was started on IV heparin in the ED. K 4.0, Cr 0.65 on arrival, Mg 1.2 11/3 -> 1.6 11/4 K 2.7 -> 11/5 4.4     11/07, 08:37 am,  pt  had VF arrest, CPR x 3 minutes, pt >> ST, no drugs or shocks needed K+ 2.6, Calcium 5.5, Mg 0.8, alb < 1.5, AST 50, T prot 3.4, BNP 482.9, H&H 10.2/27.6, CXR w/ CHF, LLL collapse ?aspiration, QTc 579 ms 11/07, 11:36 am, VT on telemetry, +CP, kept pulses, s/p DCCV x 1, did not lose LOC. Cards asked to see.   Labs again am of 11/7 after PMVT arrest. (Had otherwise been deferred due to difficult stick.) EKG showed severe QT prolongation as above. She complained of palpitations that coincided with PMVT on her telemetry, and felt similar to her orginal symptoms at SNF. She several month history of poor appetite.   GI has followed as well, she is on IV TPN. She was unable to tolerate post pyloric feeding tube placement.   EP asked to see today in setting of PMVT with EF 25-30% vs ILR to r/o PAF in setting of renal infarct.  Currently pt has lunch tray but is not yet eating. Waiting for RN to help her sit up. Denies chest pain. Appetite improving but still minimal per patient.   Past Medical History:  Diagnosis Date   Fatty liver    GERD (gastroesophageal  reflux disease)    Hypertension    Osteomyelitis ([place])    right third toe   Peripheral vascular disease ([place])    Ulcer 08/14/20     Surgical History:  Past Surgical History:  Procedure Laterality Date   AMPUTATION Right 08/08/2017   Procedure: AMPUTATION RIGHT 3RD TOE;  Surgeon: Newt Minion, MD;  Location: [place];  Service: Orthopedics;  Laterality: Right;   BIOPSY  08/14/2020   Procedure: BIOPSY;  Surgeon: Clarene Essex, MD;  Location: [facility];  Service: Endoscopy;;   Bunionectomy Right 2006     Right   Bunionectony Left 2006      August    COLONOSCOPY N/A 01/21/2014   Procedure: COLONOSCOPY;  Surgeon: Beryle Beams, MD;  Location: [facility];  Service: Endoscopy;  Laterality: N/A;   ESOPHAGOGASTRODUODENOSCOPY (EGD) WITH PROPOFOL N/A 08/14/2020   Procedure: ESOPHAGOGASTRODUODENOSCOPY (EGD) WITH PROPOFOL;  Surgeon: Clarene Essex, MD;  Location: [facility];  Service: Endoscopy;  Laterality: N/A;   IR THORACENTESIS ASP PLEURAL SPACE W/IMG GUIDE  05/03/2021   RIGHT/LEFT HEART CATH AND CORONARY ANGIOGRAPHY N/A 05/11/2021   Procedure: RIGHT/LEFT HEART CATH AND CORONARY ANGIOGRAPHY;  Surgeon: Troy Sine, MD;  Location: [place] [facility];  Service: Cardiovascular;  Laterality: N/A;   TUBAL LIGATION     WISDOM TOOTH EXTRACTION       Medications Prior to Admission  Medication Sig Dispense Refill Last Dose   acetaminophen (TYLENOL) 325 MG tablet Take 650 mg by mouth every 6 (six) hours as needed for moderate pain.   95/0/9326   bismuth subsalicylate (PEPTO BISMOL) 262 MG/15ML suspension Take 30 mLs by mouth every 6 (six) hours as needed for indigestion.   unknown   calcium carbonate (TUMS EX) 750 MG chewable tablet Chew 1 tablet by mouth daily as needed for heartburn.   unknown   Cholecalciferol (VITAMIN D3) 5000 units CAPS Take 5,000 Units by mouth daily.   05/02/2021   HYDROcodone-acetaminophen (NORCO) 5-325 MG tablet Take 1 tablet by mouth every 12 (twelve) hours as needed for moderate pain. 30 tablet 0 Past Week   hydrOXYzine (VISTARIL) 25 MG capsule Take 1 capsule (25 mg total) by mouth 2 (two) times daily as needed. (Patient taking differently: Take 25 mg by mouth 2 (two) times daily as needed for anxiety.) 30 capsule 0 05/02/2021   Magnesium Hydroxide (MILK OF MAGNESIA PO) Take 30 mLs by mouth daily as needed (constipation).   unknown   Multiple Vitamin (MULTIVITAMIN WITH MINERALS) TABS tablet Take 1 tablet by mouth daily.   Past Week   ondansetron (ZOFRAN ODT) 4 MG disintegrating tablet Take 1 tablet (4 mg total) by mouth every 8  (eight) hours as needed for nausea or vomiting. 20 tablet 0 Past Week   pantoprazole (PROTONIX) 40 MG tablet Take 1 tablet (40 mg total) by mouth daily. 30 tablet 0 05/03/2021   potassium chloride SA (KLOR-CON) 20 MEQ tablet Take 1 tablet (20 mEq total) by mouth 2 (two) times daily. 60 tablet 3 05/02/2021   sertraline (ZOLOFT) 25 MG tablet Take 1 tablet (25 mg total) by mouth daily. 90 tablet 1 05/02/2021   Tetrahydrozoline HCl (VISINE OP) Place 2 drops into both eyes daily as needed (for dry/irritated eyes).    unknown   feeding supplement (ENSURE ENLIVE / ENSURE PLUS) LIQD Take 237 mLs by mouth 3 (three) times daily between meals. (Patient not taking: Reported on 05/03/2021) 237 mL  Not Taking    Inpatient Medications:   sodium chloride  Intravenous Once   carvedilol  6.25 mg Oral BID WC   Chlorhexidine Gluconate Cloth  6 each Topical Daily   furosemide  80 mg Intravenous Q12H   losartan  25 mg Oral Daily   mouth rinse  15 mL Mouth Rinse BID   megestrol  400 mg Oral BID   potassium chloride  40 mEq Oral BID   sodium chloride flush  10-40 mL Intracatheter Q12H   sodium chloride flush  3 mL Intravenous Q12H   spironolactone  25 mg Oral Daily   sucralfate  1 g Oral BID   thiamine  100 mg Oral Daily   vitamin A  10,000 Units Oral Daily    Allergies: No Known Allergies  Social History   Socioeconomic History   Marital status: Married    Spouse name: Not on file   Number of children: Not on file   Years of education: Not on file   Highest education level: Not on file  Occupational History   Not on file  Tobacco Use   Smoking status: Never   Smokeless tobacco: Never  Vaping Use   Vaping Use: Never used  Substance and Sexual Activity   Alcohol use: Yes    Alcohol/week: 5.0 standard drinks    Types: 5 Glasses of wine per week    Comment: social glass of wine   Drug use: No   Sexual activity: Yes    Birth control/protection: Post-menopausal  Other Topics Concern   Not on file   Social History Narrative   Not on file   Social Determinants of Health   Financial Resource Strain: Not on file  Food Insecurity: Not on file  Transportation Needs: Not on file  Physical Activity: Not on file  Stress: Not on file  Social Connections: Not on file  Intimate Partner Violence: Not on file     Family History  Problem Relation Age of Onset   Breast cancer Mother    Colon cancer Mother    Heart Problems Mother    Hypertension Mother    Multiple myeloma Mother    Arthritis Mother    Cancer Mother    Heart disease Mother    Hypertension Father    Heart disease Father    Multiple myeloma Father    Arthritis Father    Cancer Father      Review of Systems: All other systems reviewed and are otherwise negative except as noted above.  Physical Exam: Vitals:   05/14/21 0338 05/14/21 0343 05/14/21 0730 05/14/21 0838  BP:  (!) 99/55 115/62 111/61  Pulse:  89 (!) 101 97  Resp:  16 15   Temp:  98.8 F (37.1 C)    TempSrc:  Oral    SpO2:  97% 96%   Weight: 56.5 kg  57.6 kg   Height:        GEN- The patient appears much greater than stated age. alert and oriented x 3 today.   HEENT: normocephalic, atraumatic; sclera clear, conjunctiva pink; hearing intact; oropharynx clear; neck supple Lungs- Clear to ausculation bilaterally, normal work of breathing.  No wheezes, rales, rhonchi Heart- Regular rate and rhythm, no murmurs, rubs or gallops GI- soft, non-tender, non-distended, bowel sounds present Extremities- no clubbing or cyanosis; DP/PT/radial pulses 2+ bilaterally. Bilateral peripheral edema. MS- no significant deformity or atrophy Skin- warm and dry, no rash or lesion Psych- flat but appropriate affect Neuro- strength and sensation are intact  Labs:   Lab Results  Component Value  Date   WBC 7.6 05/14/2021   HGB 7.3 (L) 05/14/2021   HCT 21.5 (L) 05/14/2021   MCV 97.3 05/14/2021   PLT 249 05/14/2021    Recent Labs  Lab 05/14/21 0554  NA 134*  K  5.1  CL 101  CO2 26  BUN 16  CREATININE 0.53  CALCIUM 8.6*  PROT 4.4*  BILITOT 0.5  ALKPHOS 125  ALT 19  AST 31  GLUCOSE 102*      Radiology/Studies: DG Chest 1 View  Result Date: 05/03/2021 CLINICAL DATA:  Status post thoracentesis on the right. EXAM: CHEST  1 VIEW COMPARISON:  CT earlier today.  Radiograph yesterday. FINDINGS: No pneumothorax post right thoracentesis. Right pleural effusion was previously not well seen by radiograph. There is similar blunting of the costophrenic angle. Fluid is seen tracking in the right minor fissure. Unchanged left pleural effusion and basilar opacity. Stable cardiomegaly. No pulmonary edema. IMPRESSION: 1. No pneumothorax post right thoracentesis. 2. Unchanged left pleural effusion and basilar opacity. 3. Stable cardiomegaly. Electronically Signed   By: Keith Rake M.D.   On: 05/03/2021 15:24   DG Chest 2 View  Result Date: 05/02/2021 CLINICAL DATA:  Shortness of breath EXAM: CHEST - 2 VIEW COMPARISON:  08/12/2020 FINDINGS: Bandlike opacity in the right mid lung, likely atelectasis. Moderate retrocardiac left basilar opacity. Mild cardiomegaly. IMPRESSION: Right mid lung atelectasis. Left basilar opacities may indicate atelectasis or infection. Electronically Signed   By: Ulyses Jarred M.D.   On: 05/02/2021 20:48   CT HEAD WO CONTRAST (5MM)  Result Date: 04/19/2021 CLINICAL DATA:  Neuro deficit, acute, stroke suspected EXAM: CT HEAD WITHOUT CONTRAST TECHNIQUE: Contiguous axial images were obtained from the base of the skull through the vertex without intravenous contrast. COMPARISON:  02/22/2021 FINDINGS: Brain: There is atrophy and chronic small vessel disease changes. No acute intracranial abnormality. Specifically, no hemorrhage, hydrocephalus, mass lesion, acute infarction, or significant intracranial injury. Vascular: No hyperdense vessel or unexpected calcification. Skull: No acute calvarial abnormality. Sinuses/Orbits: No acute findings  Other: None IMPRESSION: Atrophy, chronic microvascular disease. No acute intracranial abnormality. Electronically Signed   By: Rolm Baptise M.D.   On: 04/19/2021 22:52   CT Angio Chest PE W/Cm &/Or Wo Cm  Result Date: 05/03/2021 CLINICAL DATA:  Chest pain, right lower quadrant abdominal pain, urinary tract infection EXAM: CT ANGIOGRAPHY CHEST CT ABDOMEN AND PELVIS WITH CONTRAST TECHNIQUE: Multidetector CT imaging of the chest was performed using the standard protocol during bolus administration of intravenous contrast. Multiplanar CT image reconstructions and MIPs were obtained to evaluate the vascular anatomy. Multidetector CT imaging of the abdomen and pelvis was performed using the standard protocol during bolus administration of intravenous contrast. CONTRAST:  121m OMNIPAQUE IOHEXOL 300 MG/ML  SOLN COMPARISON:  None. FINDINGS: CTA CHEST FINDINGS Cardiovascular: Adequate opacification of the pulmonary arterial tree. No intraluminal filling defect identified to suggest acute pulmonary embolism. Central pulmonary arteries are of normal caliber. Mild coronary artery calcification. Cardiac size is mildly enlarged. No pericardial effusion. Mild atherosclerotic calcification within the thoracic aorta. No aortic aneurysm. Mediastinum/Nodes: No enlarged mediastinal, hilar, or axillary lymph nodes. Thyroid gland, trachea, and esophagus demonstrate no significant findings. Lungs/Pleura: Moderate to large bilateral pleural effusions are present with compressive atelectasis of the lungs bilaterally with subtotal collapse of the left lower lobe and right middle lobe. No superimposed confluent pulmonary infiltrate. No pneumothorax. Central airways are patent. Musculoskeletal: Osseous structures are age-appropriate. No acute bone abnormality. No lytic or blastic bone lesion. Review of the MIP  images confirms the above findings. CT ABDOMEN and PELVIS FINDINGS Hepatobiliary: No focal liver abnormality is seen. No  gallstones, gallbladder wall thickening, or biliary dilatation. Pancreas: Unremarkable Spleen: Unremarkable Adrenals/Urinary Tract: The adrenal glands are unremarkable. There is a perfusion defect involving the upper pole of the right kidney in keeping with an acute renal infarct. The kidneys are otherwise unremarkable. Foley catheter balloon seen within a decompressed bladder lumen. Stomach/Bowel: Mild scattered colonic diverticulosis. Stomach, small bowel, and large bowel are otherwise unremarkable. Appendix normal. No free intraperitoneal gas. Mild ascites. Vascular/Lymphatic: Mild aortoiliac atherosclerotic calcification. No aortic aneurysm. A a filling defect is seen within the left internal iliac vein, axial image # 46/6, compatible with acute DVT. The abdominal vasculature is otherwise unremarkable. No pathologic adenopathy within the abdomen and pelvis. Reproductive: Uterus and bilateral adnexa are unremarkable. Other: There is marked diffuse body wall subcutaneous edema. No abdominal wall hernia. Musculoskeletal: . No lytic degenerative changes are seen within the lumbar spine. Age-indeterminate compression deformities of L3 and L4 are identified suspected chronic, however, given the lack of surrounding paravertebral edema or infiltration or blastic bone lesion. Review of the MIP images confirms the above findings. IMPRESSION: No pulmonary embolism. Mild global cardiomegaly. Large bilateral pleural effusions, mild ascites, and marked diffuse body wall subcutaneous edema in keeping with anasarca, possibly related to cardiogenic failure. Acute right renal infarct. Infarct comprises less than 10% of the cortex of the right kidney. DVT within the left common iliac vein. Given the presence of a systemic visceral infarct, echocardiographic correlation for an intracardiac shunt and potential for paradoxical embolization may be helpful. Mild colonic diverticulosis. Aortic Atherosclerosis (ICD10-I70.0).  Electronically Signed   By: Fidela Salisbury M.D.   On: 05/03/2021 00:44   MR BRAIN WO CONTRAST  Result Date: 04/20/2021 CLINICAL DATA:  Neuro deficit, acute, stroke suspected EXAM: MRI HEAD WITHOUT CONTRAST TECHNIQUE: Multiplanar, multiecho pulse sequences of the brain and surrounding structures were obtained without intravenous contrast. COMPARISON:  CT head10/20/2022. FINDINGS: Brain: No acute infarction, hemorrhage, hydrocephalus, extra-axial collection or mass lesion. Mild scattered T2 hyperintensities in the white matter, nonspecific but compatible with chronic microvascular disease. Mild atrophy. Vascular: Major arterial flow voids are maintained at the skull base. Skull and upper cervical spine: Normal marrow signal. Sinuses/Orbits: Clear sinuses.  Unremarkable orbits Other: Small right and trace left mastoid effusions. IMPRESSION: 1. No evidence of acute intracranial abnormality. 2. Mild chronic microvascular disease and atrophy. Electronically Signed   By: Margaretha Sheffield M.D.   On: 04/20/2021 06:55   MR CERVICAL SPINE WO CONTRAST  Result Date: 04/22/2021 CLINICAL DATA:  Motor neuron disease. Frequent falls with weakness of left lower extremities. 70 y.o. female past medical history significant for fatty liver, GERD, essential hypertension, osteomyelitis of the right toe, peripheral vascular disease, history of upper GI bleed due to peptic ulcers who was recently discharged from the hospital for gastroenteritis, iron overload and maculopapular rash she is coming in today complaining of progressive weakness both lower extremities, falls and numbness. EXAM: MRI CERVICAL SPINE WITHOUT CONTRAST TECHNIQUE: Multiplanar, multisequence MR imaging of the cervical spine was performed. No intravenous contrast was administered. COMPARISON:  Limited correlation made with cranial and thoracic MRI 01/18/2021. FINDINGS: Alignment: Near anatomic. There is a slight anterolisthesis at C4-5 and C7-T1. Vertebrae: No  acute or suspicious osseous findings. Cord: Normal in signal and caliber. Posterior Fossa, vertebral arteries, paraspinal tissues: Visualized portions of the posterior fossa appear unremarkable. No significant paraspinal findings. Bilateral vertebral artery flow voids. Disc levels: C2-3:  Mild disc bulging with moderate bilateral facet hypertrophy. The central spinal canal is widely patent. Moderate right and mild left foraminal narrowing. C3-4: Mild disc bulging with bilateral uncinate spurring. Moderate to severe bilateral facet hypertrophy, worse on the right. The CSF surrounding the cord is partially effaced without cord deformity. Moderate to severe right and mild left foraminal narrowing. C4-5: There is loss of disc height with annular disc bulging and uncinate spurring. Moderate to severe facet hypertrophy bilaterally. The CSF surrounding the cord is effaced without cord deformity. Moderate to severe foraminal narrowing bilaterally which could affect either C5 nerve root. C5-6: Spondylosis with loss of disc height and posterior osteophytes asymmetric the right. Milder facet degenerative changes at this level. No cord deformity. Moderate to severe right and mild left foraminal narrowing. C6-7: Spondylosis with loss of disc height and posterior osteophytes asymmetric to the right. The CSF surrounding the cord is partially effaced with mild right-sided cord flattening. Moderate to severe right and moderate left foraminal narrowing. C7-T1: Mild loss of disc height with disc bulging, uncinate spurring and bilateral facet hypertrophy. The CSF surrounding the cord is partially effaced without cord deformity. Moderate to severe foraminal narrowing bilaterally. The visualized upper thoracic spine appears unchanged from the recent thoracic MRI. IMPRESSION: 1. Multilevel cervical spondylosis with disc bulging, uncinate spurring and facet hypertrophy as described. 2. There is mild central spinal stenosis at multiple  levels as detailed above. There is mild cord flattening on the right at C6-7. No abnormal cord signal. 3. Multilevel significant osseous foraminal narrowing which could affect the exiting nerve roots. 4. No acute findings demonstrated. Electronically Signed   By: Richardean Sale M.D.   On: 04/22/2021 10:52   MR THORACIC SPINE WO CONTRAST  Result Date: 04/20/2021 CLINICAL DATA:  Ataxia, nontraumatic, T-spine pathology suspected; Low back pain, cauda equina syndrome suspected EXAM: MRI THORACIC AND LUMBAR SPINE WITHOUT CONTRAST TECHNIQUE: Multiplanar and multiecho pulse sequences of the thoracic and lumbar spine were obtained without intravenous contrast. COMPARISON:  CT lumbar spine 02/22/2021. FINDINGS: MRI THORACIC SPINE FINDINGS Alignment:  Slight anterolisthesis of T1 on T2 and T2 on T3. Vertebrae: Vertebral body heights are maintained.Multilevel degenerative/discogenic endplate signal changes, greatest at T9-T10 where there is disc height loss. Otherwise, no focal marrow edema to suggest acute fracture or discitis/osteomyelitis. Heterogeneous bone marrow without suspicious bone lesion. Cord:  Normal cord signal. Paraspinal and other soft tissues: Moderate left greater than right pleural effusions. Disc levels: Disc bulges and ligamentum flavum thickening at multiple levels throughout the imaged lower cervical and thoracic spine. Although partially imaged on sagittal imaging, probably mild to moderate canal stenosis and potentially at least moderate foraminal stenosis at C6-C7 and C7-T1. Mild canal stenosis at T5-T6, T7-T8, and T10-T11. Multilevel facet arthropathy. Foraminal stenosis is greatest and moderate on the right at T10-T11. Otherwise, multilevel mild foraminal stenosis in the thoracic spine. MRI LUMBAR SPINE FINDINGS Segmentation:  Standard Alignment: Similar grade 1 anterolisthesis of L2 on L3, L3 on L4, and L4 on L5. Vertebrae: Redemonstrated L3 and L4 compression fractures with similar height  loss (35% L3 and 50% at L4). Edema within the superior endplates at these levels suggests that the fractures are unhealed. Edema within the L4 posterior elements. No substantial retropulsion. Conus medullaris and cauda equina: Conus extends to the T12-L1 level. Conus appears normal. Paraspinal and other soft tissues: Paraspinal muscular atrophy. Disc levels: T12-L1: No significant disc protrusion, foraminal stenosis, or canal stenosis. L1-L2: No significant disc protrusion, foraminal stenosis, or canal stenosis. L2-L3:  Grade 1 anterolisthesis. Left eccentric disc bulge, bulky ligamentum flavum thickening and mild facet arthropathy. Prominent dorsal epidural fat. Resulting moderate canal stenosis without significant foraminal stenosis. L3-L4: Grade 1 anterolisthesis. Left eccentric disc bulge with bulky ligamentum flavum thickening and moderate bilateral facet arthropathy. Prominent dorsal epidural fat. Resulting moderate to severe canal stenosis without significant foraminal stenosis. L4-L5: Grade 1 anterolisthesis. Uncovering the disc with uncovering the disc and superimposed broad disc bulge. Severe bilateral facet arthropathy with bulky ligamentum flavum thickening. Resulting severe canal stenosis. Severe right and moderate left foraminal stenosis. L5-S1: Mild broad disc bulge and endplate spurring with mild-to-moderate bilateral facet arthropathy. Resulting mild bilateral foraminal stenosis and mild bilateral subarticular recess stenosis without significant canal stenosis. IMPRESSION: MR LUMBAR SPINE IMPRESSION 1. Unhealed L3 and L4 compression fractures with similar height loss and marrow edema along the superior endplates. Edema within the L4 posterior elements could be posttraumatic or degenerative in etiology. 2. At L4-L5, severe canal stenosis with severe right and moderate left foraminal stenosis. 3. Moderate to severe canal stenosis at L3-L4 and moderate canal stenosis at L2-L3. MR THORACIC SPINE  IMPRESSION 1. Moderate foraminal stenosis on the right at T10-T11. Otherwise, multilevel mild foraminal stenosis in the thoracic spine. 2. Multilevel degenerative disc disease and mild canal stenosis in the thoracic spine. 3. Potentially mild-to-moderate canal stenosis and at least moderate foraminal stenosis C6-C7 and C7-T1, partially only imaged on sagittal and incompletely assessed. Dedicated MRI of the cervical spine could further characterize if clinically indicated. 4. Moderate left greater than right pleural effusions. Consider dedicated chest imaging. Electronically Signed   By: Margaretha Sheffield M.D.   On: 04/20/2021 07:46   MR LUMBAR SPINE WO CONTRAST  Result Date: 04/20/2021 CLINICAL DATA:  Ataxia, nontraumatic, T-spine pathology suspected; Low back pain, cauda equina syndrome suspected EXAM: MRI THORACIC AND LUMBAR SPINE WITHOUT CONTRAST TECHNIQUE: Multiplanar and multiecho pulse sequences of the thoracic and lumbar spine were obtained without intravenous contrast. COMPARISON:  CT lumbar spine 02/22/2021. FINDINGS: MRI THORACIC SPINE FINDINGS Alignment:  Slight anterolisthesis of T1 on T2 and T2 on T3. Vertebrae: Vertebral body heights are maintained.Multilevel degenerative/discogenic endplate signal changes, greatest at T9-T10 where there is disc height loss. Otherwise, no focal marrow edema to suggest acute fracture or discitis/osteomyelitis. Heterogeneous bone marrow without suspicious bone lesion. Cord:  Normal cord signal. Paraspinal and other soft tissues: Moderate left greater than right pleural effusions. Disc levels: Disc bulges and ligamentum flavum thickening at multiple levels throughout the imaged lower cervical and thoracic spine. Although partially imaged on sagittal imaging, probably mild to moderate canal stenosis and potentially at least moderate foraminal stenosis at C6-C7 and C7-T1. Mild canal stenosis at T5-T6, T7-T8, and T10-T11. Multilevel facet arthropathy. Foraminal stenosis  is greatest and moderate on the right at T10-T11. Otherwise, multilevel mild foraminal stenosis in the thoracic spine. MRI LUMBAR SPINE FINDINGS Segmentation:  Standard Alignment: Similar grade 1 anterolisthesis of L2 on L3, L3 on L4, and L4 on L5. Vertebrae: Redemonstrated L3 and L4 compression fractures with similar height loss (35% L3 and 50% at L4). Edema within the superior endplates at these levels suggests that the fractures are unhealed. Edema within the L4 posterior elements. No substantial retropulsion. Conus medullaris and cauda equina: Conus extends to the T12-L1 level. Conus appears normal. Paraspinal and other soft tissues: Paraspinal muscular atrophy. Disc levels: T12-L1: No significant disc protrusion, foraminal stenosis, or canal stenosis. L1-L2: No significant disc protrusion, foraminal stenosis, or canal stenosis. L2-L3: Grade 1 anterolisthesis. Left eccentric disc bulge,  bulky ligamentum flavum thickening and mild facet arthropathy. Prominent dorsal epidural fat. Resulting moderate canal stenosis without significant foraminal stenosis. L3-L4: Grade 1 anterolisthesis. Left eccentric disc bulge with bulky ligamentum flavum thickening and moderate bilateral facet arthropathy. Prominent dorsal epidural fat. Resulting moderate to severe canal stenosis without significant foraminal stenosis. L4-L5: Grade 1 anterolisthesis. Uncovering the disc with uncovering the disc and superimposed broad disc bulge. Severe bilateral facet arthropathy with bulky ligamentum flavum thickening. Resulting severe canal stenosis. Severe right and moderate left foraminal stenosis. L5-S1: Mild broad disc bulge and endplate spurring with mild-to-moderate bilateral facet arthropathy. Resulting mild bilateral foraminal stenosis and mild bilateral subarticular recess stenosis without significant canal stenosis. IMPRESSION: MR LUMBAR SPINE IMPRESSION 1. Unhealed L3 and L4 compression fractures with similar height loss and marrow  edema along the superior endplates. Edema within the L4 posterior elements could be posttraumatic or degenerative in etiology. 2. At L4-L5, severe canal stenosis with severe right and moderate left foraminal stenosis. 3. Moderate to severe canal stenosis at L3-L4 and moderate canal stenosis at L2-L3. MR THORACIC SPINE IMPRESSION 1. Moderate foraminal stenosis on the right at T10-T11. Otherwise, multilevel mild foraminal stenosis in the thoracic spine. 2. Multilevel degenerative disc disease and mild canal stenosis in the thoracic spine. 3. Potentially mild-to-moderate canal stenosis and at least moderate foraminal stenosis C6-C7 and C7-T1, partially only imaged on sagittal and incompletely assessed. Dedicated MRI of the cervical spine could further characterize if clinically indicated. 4. Moderate left greater than right pleural effusions. Consider dedicated chest imaging. Electronically Signed   By: Margaretha Sheffield M.D.   On: 04/20/2021 07:46   DG Chest Left Decubitus  Result Date: 05/07/2021 CLINICAL DATA:  Possible pneumothorax. EXAM: CHEST - LEFT DECUBITUS COMPARISON:  Chest radiograph done earlier the same day. CT chest 05/03/2021. FINDINGS: No right-sided pneumothorax. Layering bilateral pleural effusions. Nodular density is seen in the right midlung zone. Correlation with CT 05/03/2021 is challenging due to a large pleural effusion and collapse/consolidation in the right lung on that study. IMPRESSION: 1. No right pneumothorax. 2. Nodular density in the right midlung zone. Recommend attention on follow-up. 3. Bilateral pleural effusions. Electronically Signed   By: Lorin Picket M.D.   On: 05/07/2021 13:15   CT Abdomen Pelvis W Contrast  Result Date: 05/03/2021 CLINICAL DATA:  Chest pain, right lower quadrant abdominal pain, urinary tract infection EXAM: CT ANGIOGRAPHY CHEST CT ABDOMEN AND PELVIS WITH CONTRAST TECHNIQUE: Multidetector CT imaging of the chest was performed using the standard  protocol during bolus administration of intravenous contrast. Multiplanar CT image reconstructions and MIPs were obtained to evaluate the vascular anatomy. Multidetector CT imaging of the abdomen and pelvis was performed using the standard protocol during bolus administration of intravenous contrast. CONTRAST:  110m OMNIPAQUE IOHEXOL 300 MG/ML  SOLN COMPARISON:  None. FINDINGS: CTA CHEST FINDINGS Cardiovascular: Adequate opacification of the pulmonary arterial tree. No intraluminal filling defect identified to suggest acute pulmonary embolism. Central pulmonary arteries are of normal caliber. Mild coronary artery calcification. Cardiac size is mildly enlarged. No pericardial effusion. Mild atherosclerotic calcification within the thoracic aorta. No aortic aneurysm. Mediastinum/Nodes: No enlarged mediastinal, hilar, or axillary lymph nodes. Thyroid gland, trachea, and esophagus demonstrate no significant findings. Lungs/Pleura: Moderate to large bilateral pleural effusions are present with compressive atelectasis of the lungs bilaterally with subtotal collapse of the left lower lobe and right middle lobe. No superimposed confluent pulmonary infiltrate. No pneumothorax. Central airways are patent. Musculoskeletal: Osseous structures are age-appropriate. No acute bone abnormality. No lytic or  blastic bone lesion. Review of the MIP images confirms the above findings. CT ABDOMEN and PELVIS FINDINGS Hepatobiliary: No focal liver abnormality is seen. No gallstones, gallbladder wall thickening, or biliary dilatation. Pancreas: Unremarkable Spleen: Unremarkable Adrenals/Urinary Tract: The adrenal glands are unremarkable. There is a perfusion defect involving the upper pole of the right kidney in keeping with an acute renal infarct. The kidneys are otherwise unremarkable. Foley catheter balloon seen within a decompressed bladder lumen. Stomach/Bowel: Mild scattered colonic diverticulosis. Stomach, small bowel, and large  bowel are otherwise unremarkable. Appendix normal. No free intraperitoneal gas. Mild ascites. Vascular/Lymphatic: Mild aortoiliac atherosclerotic calcification. No aortic aneurysm. A a filling defect is seen within the left internal iliac vein, axial image # 46/6, compatible with acute DVT. The abdominal vasculature is otherwise unremarkable. No pathologic adenopathy within the abdomen and pelvis. Reproductive: Uterus and bilateral adnexa are unremarkable. Other: There is marked diffuse body wall subcutaneous edema. No abdominal wall hernia. Musculoskeletal: . No lytic degenerative changes are seen within the lumbar spine. Age-indeterminate compression deformities of L3 and L4 are identified suspected chronic, however, given the lack of surrounding paravertebral edema or infiltration or blastic bone lesion. Review of the MIP images confirms the above findings. IMPRESSION: No pulmonary embolism. Mild global cardiomegaly. Large bilateral pleural effusions, mild ascites, and marked diffuse body wall subcutaneous edema in keeping with anasarca, possibly related to cardiogenic failure. Acute right renal infarct. Infarct comprises less than 10% of the cortex of the right kidney. DVT within the left common iliac vein. Given the presence of a systemic visceral infarct, echocardiographic correlation for an intracardiac shunt and potential for paradoxical embolization may be helpful. Mild colonic diverticulosis. Aortic Atherosclerosis (ICD10-I70.0). Electronically Signed   By: Fidela Salisbury M.D.   On: 05/03/2021 00:44   CARDIAC CATHETERIZATION  Result Date: 05/11/2021   Prox LAD lesion is 20% stenosed.   Ost LAD to Prox LAD lesion is 20% stenosed. No  obstructive CAD with minimal coronary calcification at the ostium and proximal LAD with 20% narrowing.  Otherwise normal coronary arteries. Normal right heart pressures Findings compatible with a nonischemic cardiomyopathy. Significant calcification in the femoral arteries  right greater than left with significant luminal calcification in the right femoral artery. RECOMMENDATION: Guideline directed medical therapy for severe LV dysfunction.  Avoidance of QT prolongation medications.  Patient has significant claudication symptomatology.   DG CHEST PORT 1 VIEW  Result Date: 05/08/2021 CLINICAL DATA:  Of breath EXAM: PORTABLE CHEST 1 VIEW COMPARISON:  Portable exam 1654 hours compared to 05/07/2021 FINDINGS: RIGHT arm PICC line with tip projecting over cavoatrial junction. Enlargement of cardiac silhouette with pulmonary vascular congestion. Diffuse pulmonary infiltrate consistent with pulmonary edema. Layering pleural effusions, better appreciated on preceding lateral decubitus radiographs. BILATERAL lower lobe atelectasis versus consolidation without pneumothorax. Questioned nodular density on prior exam not visualized. Osseous demineralization. IMPRESSION: Enlargement of cardiac silhouette with pulmonary vascular congestion and questionable perihilar edema. Layering BILATERAL pleural effusions. Additional atelectasis versus consolidation of lower lobes. Electronically Signed   By: Lavonia Dana M.D.   On: 05/08/2021 17:10   DG CHEST PORT 1 VIEW  Result Date: 05/07/2021 CLINICAL DATA:  Cardiac arrest. EXAM: PORTABLE CHEST 1 VIEW COMPARISON:  05/03/2021 and CT chest 05/03/2021. FINDINGS: Trachea is midline. Heart is enlarged, stable. Thoracic aorta is calcified. Defibrillator pad overlies the mid chest. There is a vertically oriented linear density along the apical and lateral aspect of the right hemithorax but there may be parenchymal markings extending beyond margin. Interstitial prominence and indistinctness  with bilateral pleural effusions. Left lower lobe collapse/consolidation. Degenerative changes in the shoulders. IMPRESSION: 1. Possible artifact versus small pneumothorax in the right hemithorax. Recommend left lateral decubitus view in further evaluation, as clinically  indicated. These results will be called to the ordering clinician or representative by the Radiologist Assistant, and communication documented in the PACS or [employer]. 2. Congestive heart failure. 3. Left lower lobe collapse/consolidation. Difficult to exclude aspiration. Electronically Signed   By: Lorin Picket M.D.   On: 05/07/2021 08:55   ECHOCARDIOGRAM COMPLETE  Result Date: 05/07/2021    ECHOCARDIOGRAM REPORT   Patient Name:   RICA HEATHER Date of Exam: 05/07/2021 Medical Rec #:  [id number]          Height:       61.0 in Accession #:    [id number]         Weight:       136.0 lb Date of Birth:  1950/09/15         BSA:          1.603 m Patient Age:    61 years           BP:           126/66 mmHg Patient Gender: F                  HR:           93 bpm. Exam Location:  Inpatient Procedure: 2D Echo, Cardiac Doppler and Color Doppler Indications:    Cardiac arrest  History:        Patient has prior history of Echocardiogram examinations, most                 recent 04/21/2021. CHF, PAD, Arrythmias:Cardiac Arrest and                 Ventricular Fibrillation, Signs/Symptoms:Chest Pain and Large                 pleural effusion; Risk Factors:Hypertension.  Sonographer:    Dustin Flock RDCS Referring Phys: [id number] [place]  1. Septal and apical hypokinesis . Left ventricular ejection fraction, by estimation, is 40 to 45%. The left ventricle has mildly decreased function. The left ventricle demonstrates regional wall motion abnormalities (see scoring diagram/findings for description). Left ventricular diastolic parameters were normal.  2. Right ventricular systolic function is normal. The right ventricular size is normal.  3. There is a persistent pleurs effusion and trivial pericardial effusion Also appears to be prominent epicardial fat seen over RV. Can consider f/u CT chest if clinically indicated to further assess. The pericardial effusion is lateral to the left ventricle.  4.  The mitral valve is abnormal. Trivial mitral valve regurgitation. No evidence of mitral stenosis.  5. The aortic valve is tricuspid. There is mild calcification of the aortic valve. Aortic valve regurgitation is not visualized. Mild aortic valve sclerosis is present, with no evidence of aortic valve stenosis.  6. The inferior vena cava is normal in size with greater than 50% respiratory variability, suggesting right atrial pressure of 3 mmHg. FINDINGS  Left Ventricle: Septal and apical hypokinesis. Left ventricular ejection fraction, by estimation, is 40 to 45%. The left ventricle has mildly decreased function. The left ventricle demonstrates regional wall motion abnormalities. The left ventricular internal cavity size was normal in size. There is no left ventricular hypertrophy. Left ventricular diastolic parameters were normal. Right Ventricle: The right ventricular size is normal. No increase in  right ventricular wall thickness. Right ventricular systolic function is normal. Left Atrium: Left atrial size was normal in size. Right Atrium: Right atrial size was normal in size. Pericardium: There is a persistent pleurs effusion and trivial pericardial effusion Also appears to be prominent epicardial fat seen over RV. Can consider f/u CT chest if clinically indicated to further assess. Trivial pericardial effusion is present. The pericardial effusion is lateral to the left ventricle. Mitral Valve: The mitral valve is abnormal. Trivial mitral valve regurgitation. No evidence of mitral valve stenosis. Tricuspid Valve: The tricuspid valve is normal in structure. Tricuspid valve regurgitation is trivial. No evidence of tricuspid stenosis. Aortic Valve: The aortic valve is tricuspid. There is mild calcification of the aortic valve. Aortic valve regurgitation is not visualized. Mild aortic valve sclerosis is present, with no evidence of aortic valve stenosis. Pulmonic Valve: The pulmonic valve was normal in structure.  Pulmonic valve regurgitation is not visualized. No evidence of pulmonic stenosis. Aorta: The aortic root is normal in size and structure. Venous: The inferior vena cava is normal in size with greater than 50% respiratory variability, suggesting right atrial pressure of 3 mmHg. IAS/Shunts: No atrial level shunt detected by color flow Doppler.  LEFT VENTRICLE PLAX 2D LVIDd:         4.50 cm     Diastology LVIDs:         2.90 cm     LV e' medial:    4.57 cm/s LV PW:         1.00 cm     LV E/e' medial:  10.0 LV IVS:        1.00 cm     LV e' lateral:   5.77 cm/s LVOT diam:     2.00 cm     LV E/e' lateral: 7.9 LV SV:         52 LV SV Index:   32 LVOT Area:     3.14 cm  LV Volumes (MOD) LV vol d, MOD A4C: 73.0 ml LV vol s, MOD A4C: 35.5 ml LV SV MOD A4C:     73.0 ml RIGHT VENTRICLE RV Basal diam:  2.70 cm RV S prime:     11.10 cm/s TAPSE (M-mode): 1.5 cm LEFT ATRIUM             Index        RIGHT ATRIUM           Index LA diam:        2.30 cm 1.43 cm/m   RA Area:     10.50 cm LA Vol (A2C):   23.9 ml 14.91 ml/m  RA Volume:   20.30 ml  12.66 ml/m LA Vol (A4C):   39.9 ml 24.89 ml/m LA Biplane Vol: 31.6 ml 19.71 ml/m  AORTIC VALVE LVOT Vmax:   103.00 cm/s LVOT Vmean:  70.200 cm/s LVOT VTI:    0.164 m  AORTA Ao Root diam: 2.70 cm MITRAL VALVE MV Area (PHT): 5.62 cm    SHUNTS MV Decel Time: 135 msec    Systemic VTI:  0.16 m MV E velocity: 45.80 cm/s  Systemic Diam: 2.00 cm MV A velocity: 71.10 cm/s MV E/A ratio:  0.64 Jenkins Rouge MD Electronically signed by Jenkins Rouge MD Signature Date/Time: 05/07/2021/2:32:42 PM    Final    ECHOCARDIOGRAM COMPLETE  Result Date: 04/21/2021    ECHOCARDIOGRAM REPORT   Patient Name:   PERLE BRICKHOUSE Date of Exam: 04/21/2021 Medical Rec #:  [id number]  Height:       61.0 in Accession #:    [id number]         Weight:       117.0 lb Date of Birth:  Aug 28, 1950         BSA:          1.504 m Patient Age:    70 years           BP:           132/80 mmHg Patient Gender: F                   HR:           100 bpm. Exam Location:  Inpatient Procedure: 2D Echo Indications:    Chest pain. Abnormal ecg.  History:        Patient has no prior history of Echocardiogram examinations.                 Pleural effusion; Risk Factors:Hypertension.  Sonographer:    Johny Chess RDCS Referring Phys: [id number] [place]  1. Left ventricular ejection fraction, by estimation, is 45 to 50%. The left ventricle has low normal function. The left ventricle demonstrates regional wall motion abnormalities (see scoring diagram/findings for description). Left ventricular diastolic  parameters are consistent with Grade I diastolic dysfunction (impaired relaxation).  2. Right ventricular systolic function is normal. The right ventricular size is normal. Tricuspid regurgitation signal is inadequate for assessing PA pressure.  3. Large pleural effusion in the left lateral region.  4. The mitral valve is normal in structure. Trivial mitral valve regurgitation. No evidence of mitral stenosis.  5. The aortic valve is tricuspid. There is mild calcification of the aortic valve. There is mild thickening of the aortic valve. Aortic valve regurgitation is not visualized. No aortic stenosis is present.  6. The inferior vena cava is normal in size with greater than 50% respiratory variability, suggesting right atrial pressure of 3 mmHg. FINDINGS  Left Ventricle: The anteroseptal wall and apex appear hypokinetic. Left ventricular ejection fraction, by estimation, is 45 to 50%. The left ventricle has low normal function. The left ventricle demonstrates regional wall motion abnormalities. The left ventricular internal cavity size was normal in size. There is no left ventricular hypertrophy. Left ventricular diastolic parameters are consistent with Grade I diastolic dysfunction (impaired relaxation). Normal left ventricular filling pressure. Right Ventricle: The right ventricular size is normal. No increase in right  ventricular wall thickness. Right ventricular systolic function is normal. Tricuspid regurgitation signal is inadequate for assessing PA pressure. Left Atrium: Left atrial size was normal in size. Right Atrium: Right atrial size was normal in size. Pericardium: There is no evidence of pericardial effusion. Mitral Valve: The mitral valve is normal in structure. Trivial mitral valve regurgitation. No evidence of mitral valve stenosis. Tricuspid Valve: The tricuspid valve is normal in structure. Tricuspid valve regurgitation is not demonstrated. No evidence of tricuspid stenosis. Aortic Valve: The aortic valve is tricuspid. There is mild calcification of the aortic valve. There is mild thickening of the aortic valve. There is mild aortic valve annular calcification. Aortic valve regurgitation is not visualized. No aortic stenosis  is present. Aortic valve mean gradient measures 2.5 mmHg. Aortic valve peak gradient measures 5.6 mmHg. Aortic valve area, by VTI measures 2.19 cm. Pulmonic Valve: The pulmonic valve was not well visualized. Pulmonic valve regurgitation is not visualized. No evidence of pulmonic stenosis. Aorta: The aortic root is normal in size  and structure. Venous: The inferior vena cava is normal in size with greater than 50% respiratory variability, suggesting right atrial pressure of 3 mmHg. IAS/Shunts: No atrial level shunt detected by color flow Doppler. Additional Comments: There is a large pleural effusion in the left lateral region.  LEFT VENTRICLE PLAX 2D LVIDd:         4.10 cm     Diastology LVIDs:         3.20 cm     LV e' medial:    6.20 cm/s LV PW:         0.90 cm     LV E/e' medial:  7.6 LV IVS:        0.80 cm     LV e' lateral:   5.98 cm/s LVOT diam:     2.00 cm     LV E/e' lateral: 7.9 LV SV:         46 LV SV Index:   30 LVOT Area:     3.14 cm  LV Volumes (MOD) LV vol d, MOD A2C: 50.8 ml LV vol d, MOD A4C: 68.8 ml LV vol s, MOD A2C: 28.6 ml LV vol s, MOD A4C: 45.8 ml LV SV MOD A2C:      22.2 ml LV SV MOD A4C:     68.8 ml LV SV MOD BP:      21.8 ml RIGHT VENTRICLE             IVC RV S prime:     14.70 cm/s  IVC diam: 1.00 cm TAPSE (M-mode): 1.6 cm LEFT ATRIUM             Index        RIGHT ATRIUM           Index LA diam:        2.40 cm 1.60 cm/m   RA Area:     12.10 cm LA Vol (A2C):   23.4 ml 15.56 ml/m  RA Volume:   27.10 ml  18.02 ml/m LA Vol (A4C):   45.2 ml 30.06 ml/m LA Biplane Vol: 34.7 ml 23.07 ml/m  AORTIC VALVE AV Area (Vmax):    2.47 cm AV Area (Vmean):   2.70 cm AV Area (VTI):     2.19 cm AV Vmax:           118.62 cm/s AV Vmean:          70.876 cm/s AV VTI:            0.210 m AV Peak Grad:      5.6 mmHg AV Mean Grad:      2.5 mmHg LVOT Vmax:         93.40 cm/s LVOT Vmean:        60.900 cm/s LVOT VTI:          0.146 m LVOT/AV VTI ratio: 0.70  AORTA Ao Root diam: 2.70 cm Ao Asc diam:  2.90 cm MV E velocity: 47.40 cm/s MV A velocity: 94.70 cm/s  SHUNTS MV E/A ratio:  0.50        Systemic VTI:  0.15 m                            Systemic Diam: 2.00 cm Carlyle Dolly MD Electronically signed by Carlyle Dolly MD Signature Date/Time: 04/21/2021/11:55:23 AM    Final    VAS [place] LOWER EXTREMITY VENOUS (DVT)  Result Date: 05/03/2021  Lower Venous  DVT Study Patient Name:  TRYNITI LAATSCH  Date of Exam:   05/03/2021 Medical Rec #: [id number]           Accession #:    [id number] Date of Birth: 10/13/1950          Patient Gender: F Patient Age:   22 years Exam Location:  [facility] Procedure:      VAS [place] LOWER EXTREMITY VENOUS (DVT) Referring Phys: Terrilee Croak --------------------------------------------------------------------------------  Indications: Swelling, and Edema.  Comparison Study: no prior Performing Technologist: Archie Patten RVS  Examination Guidelines: A complete evaluation includes B-mode imaging, spectral Doppler, color Doppler, and power Doppler as needed of all accessible portions of each vessel. Bilateral testing is considered an integral part of a  complete examination. Limited examinations for reoccurring indications may be performed as noted. The reflux portion of the exam is performed with the patient in reverse Trendelenburg.  +--------+---------------+---------+-----------+----------+--------------------+ RIGHT   CompressibilityPhasicitySpontaneityPropertiesThrombus Aging       +--------+---------------+---------+-----------+----------+--------------------+ CFV     Partial        Yes      Yes                  Age Indeterminate    +--------+---------------+---------+-----------+----------+--------------------+ SFJ     Full                                                              +--------+---------------+---------+-----------+----------+--------------------+ FV Prox Partial                                      Age Indeterminate    +--------+---------------+---------+-----------+----------+--------------------+ FV Mid  Full           Yes      Yes                                       +--------+---------------+---------+-----------+----------+--------------------+ FV                     Yes      Yes                  patent by color      Distal                                               doppler              +--------+---------------+---------+-----------+----------+--------------------+ PFV     Full                                                              +--------+---------------+---------+-----------+----------+--------------------+ POP     Full           Yes      Yes                                       +--------+---------------+---------+-----------+----------+--------------------+  PTV     Full                                                              +--------+---------------+---------+-----------+----------+--------------------+ PERO                                                 Not well visualized   +--------+---------------+---------+-----------+----------+--------------------+   +--------+---------------+---------+-----------+----------+--------------------+ LEFT    CompressibilityPhasicitySpontaneityPropertiesThrombus Aging       +--------+---------------+---------+-----------+----------+--------------------+ CFV     Full           Yes      Yes                                       +--------+---------------+---------+-----------+----------+--------------------+ SFJ     Full                                                              +--------+---------------+---------+-----------+----------+--------------------+ FV Prox Full                                                              +--------+---------------+---------+-----------+----------+--------------------+ FV Mid  Full           Yes      Yes                  patent by color                                                           doppler              +--------+---------------+---------+-----------+----------+--------------------+ FV                     Yes      Yes                  patent by color      Distal                                               doppler              +--------+---------------+---------+-----------+----------+--------------------+ POP     Full           Yes      Yes                                       +--------+---------------+---------+-----------+----------+--------------------+  PTV     Full                                                              +--------+---------------+---------+-----------+----------+--------------------+ PERO                                                 Not well visualized  +--------+---------------+---------+-----------+----------+--------------------+     Summary: RIGHT: - Findings consistent with age indeterminate deep vein thrombosis involving the right common femoral vein, and right femoral vein. - No cystic  structure found in the popliteal fossa.  LEFT: - There is no evidence of deep vein thrombosis in the lower extremity.  - No cystic structure found in the popliteal fossa.  *See table(s) above for measurements and observations. Electronically signed by Jamelle Haring on 05/03/2021 at 1:00:04 PM.    Final    VAS [place] UPPER EXTREMITY VENOUS DUPLEX  Result Date: 05/10/2021 UPPER VENOUS STUDY  Patient Name:  Shajuan Musso  Date of Exam:   05/09/2021 Medical Rec #: [id number]           Accession #:    [id number] Date of Birth: 10/15/1950          Patient Gender: F Patient Age:   85 years Exam Location:  [facility] Procedure:      VAS [place] UPPER EXTREMITY VENOUS DUPLEX Referring Phys: Trevor Mace --------------------------------------------------------------------------------  Indications: Edema Comparison Study: No previous exams Performing Technologist: Rogelia Rohrer RVT, RDMS Supporting Technologist: RVT, RDMS  Examination Guidelines: A complete evaluation includes B-mode imaging, spectral Doppler, color Doppler, and power Doppler as needed of all accessible portions of each vessel. Bilateral testing is considered an integral part of a complete examination. Limited examinations for reoccurring indications may be performed as noted.  Right Findings: +----------+------------+---------+-----------+----------+-------+ RIGHT     CompressiblePhasicitySpontaneousPropertiesSummary +----------+------------+---------+-----------+----------+-------+ Subclavian    Full       Yes       Yes                      +----------+------------+---------+-----------+----------+-------+  Left Findings: +----------+------------+---------+-----------+----------+-------+ LEFT      CompressiblePhasicitySpontaneousPropertiesSummary +----------+------------+---------+-----------+----------+-------+ IJV           Full       Yes       Yes                       +----------+------------+---------+-----------+----------+-------+ Subclavian    Full       Yes       Yes                      +----------+------------+---------+-----------+----------+-------+ Axillary      Full       Yes       Yes                      +----------+------------+---------+-----------+----------+-------+ Brachial      Full       Yes       Yes                      +----------+------------+---------+-----------+----------+-------+  Radial        Full                                          +----------+------------+---------+-----------+----------+-------+ Ulnar         Full                                          +----------+------------+---------+-----------+----------+-------+ Cephalic      Full                                          +----------+------------+---------+-----------+----------+-------+ Basilic       Full       Yes       Yes                      +----------+------------+---------+-----------+----------+-------+  Summary:  Right: No evidence of thrombosis in the subclavian.  Left: No evidence of deep vein thrombosis in the upper extremity. No evidence of superficial vein thrombosis in the upper extremity.  *See table(s) above for measurements and observations.  Diagnosing physician: Monica Martinez MD Electronically signed by Monica Martinez MD on 05/10/2021 at 3:32:10 PM.    Final    ECHOCARDIOGRAM LIMITED  Result Date: 05/09/2021    ECHOCARDIOGRAM LIMITED REPORT   Patient Name:   MARCH JOOS Date of Exam: 05/09/2021 Medical Rec #:  [id number]          Height:       61.0 in Accession #:    [id number]         Weight:       134.9 lb Date of Birth:  26-Jun-1951         BSA:          1.598 m Patient Age:    15 years           BP:           135/93 mmHg Patient Gender: F                  HR:           117 bpm. Exam Location:  Inpatient Procedure: 2D Echo Indications:    acute systolic chf  History:        Patient has prior history  of Echocardiogram examinations, most                 recent 05/07/2021.  Sonographer:    Johny Chess RDCS Referring Phys: [id number] [place]  1. Left ventricular ejection fraction, by estimation, is 25 to 30%. The left ventricle has severely decreased function. The left ventricle demonstrates global hypokinesis. Left ventricular diastolic parameters are consistent with Grade I diastolic dysfunction (impaired relaxation).  2. Right ventricular systolic function is moderately reduced. The right ventricular size is mildly enlarged. Tricuspid regurgitation signal is inadequate for assessing PA pressure.  3. The mitral valve is normal in structure. Mild mitral valve regurgitation. No evidence of mitral stenosis.  4. The aortic valve is tricuspid. Aortic valve regurgitation is not visualized. Mild aortic valve sclerosis is present, with no evidence of aortic valve stenosis.  5. The inferior vena cava is dilated  in size with <50% respiratory variability, suggesting right atrial pressure of 15 mmHg.  6. There is a left pleural effusion.  7. Limited echo, biventricular dysfunction. FINDINGS  Left Ventricle: Left ventricular ejection fraction, by estimation, is 25 to 30%. The left ventricle has severely decreased function. The left ventricle demonstrates global hypokinesis. The left ventricular internal cavity size was normal in size. There is no left ventricular hypertrophy. Left ventricular diastolic parameters are consistent with Grade I diastolic dysfunction (impaired relaxation). Right Ventricle: The right ventricular size is mildly enlarged. Right ventricular systolic function is moderately reduced. Tricuspid regurgitation signal is inadequate for assessing PA pressure. Pericardium: There is a left pleural effusion. There is no evidence of pericardial effusion. Mitral Valve: The mitral valve is normal in structure. Mild mitral annular calcification. Mild mitral valve regurgitation. No evidence  of mitral valve stenosis. Tricuspid Valve: The tricuspid valve is normal in structure. Tricuspid valve regurgitation is not demonstrated. Aortic Valve: The aortic valve is tricuspid. Aortic valve regurgitation is not visualized. Mild aortic valve sclerosis is present, with no evidence of aortic valve stenosis. Venous: The inferior vena cava is dilated in size with less than 50% respiratory variability, suggesting right atrial pressure of 15 mmHg. LEFT VENTRICLE PLAX 2D LVIDd:         5.00 cm     Diastology LVIDs:         4.00 cm     LV e' lateral: 7.83 cm/s LV PW:         0.80 cm LV IVS:        0.60 cm  LV Volumes (MOD) LV vol d, MOD A4C: 71.2 ml LV vol s, MOD A4C: 46.4 ml LV SV MOD A4C:     71.2 ml IVC IVC diam: 2.30 cm LEFT ATRIUM         Index LA diam:    3.40 cm 2.13 cm/m  AORTIC VALVE LVOT Vmax:   86.80 cm/s LVOT Vmean:  63.000 cm/s LVOT VTI:    0.122 m  AORTA Ao Asc diam: 2.90 cm  SHUNTS Systemic VTI: 0.12 m [place] Electronically signed by Franki Monte Signature Date/Time: 05/09/2021/6:52:14 PM    Final    [place] EKG SITE RITE  Result Date: 05/07/2021 If Site Rite image not attached, placement could not be confirmed due to current cardiac rhythm.  US Abdomen Limited RUQ (LIVER/GB)  Result Date: 05/07/2021 CLINICAL DATA:  Ascites on CT scan dated 05/03/2021 EXAM: ULTRASOUND ABDOMEN LIMITED RIGHT UPPER QUADRANT COMPARISON:  None. FINDINGS: Gallbladder: No gallstones. Mild focal thickening of the gallbladder wall measuring up to 4 mm. The technologist reported positive sonographic Murphy's sign. Common bile duct: Diameter: 2 mm Liver: No focal lesion identified. Within normal limits in parenchymal echogenicity. Portal vein is patent on color Doppler imaging with normal direction of blood flow towards the liver. Other: Mild ascites IMPRESSION: 1.  Mild ascites. 2. Mild focal gallbladder wall thickening and positive sonographic Murphy sign, nonspecific findings. This may be sequela of chronic  ascites and/or volume overload. Clinical correlation is suggested. Electronically Signed   By: Keane Police D.O.   On: 05/07/2021 15:54   IR THORACENTESIS ASP PLEURAL SPACE W/IMG GUIDE  Result Date: 05/03/2021 INDICATION: Patient with a history of heart failure presents today with bilateral pleural effusions. Interventional radiology asked to perform a diagnostic and therapeutic thoracentesis. EXAM: ULTRASOUND GUIDED THORACENTESIS MEDICATIONS: 1% lidocaine 20 mL COMPLICATIONS: None immediate. PROCEDURE: An ultrasound guided thoracentesis was thoroughly discussed with the patient and questions answered.  The benefits, risks, alternatives and complications were also discussed. The patient understands and wishes to proceed with the procedure. Written consent was obtained. Ultrasound was performed to localize and mark an adequate pocket of fluid in the right chest. The area was then prepped and draped in the normal sterile fashion. 1% Lidocaine was used for local anesthesia. Under ultrasound guidance a 6 Fr Safe-T-Centesis catheter was introduced. Thoracentesis was performed. The catheter was removed and a dressing applied. FINDINGS: A total of approximately 500 mL of clear yellow fluid was removed. Samples were sent to the laboratory as requested by the clinical team. IMPRESSION: Successful ultrasound guided right thoracentesis yielding 500 mL of pleural fluid. Read by: Soyla Dryer, NP Electronically Signed   By: Jacqulynn Cadet M.D.   On: 05/03/2021 16:15    EKG:EKG on arrival showed Sinus tach at 101 with QTc of 540 Follow up 11/4 showed NSR at 93 bpm with QTC >600 ms and T wave alternans in V4  (personally reviewed)  TELEMETRY: NSR 90-100s mostly (personally reviewed)  Assessment/Plan: 1.  S/p cardiac arrest due to polymorphic VT/torsades in the setting of QT prolongation QT >580/~600 this admission in the setting of marked electrolyte abnormality.  Day of arrest Mg came back 0.8, K 2.6 Etiology  is complicated by acute issues, electrolyte abnormalities, and bradycardia with PVCs.  EF down to 25-30% post arrest from 40-45% several days prior Ultimately, she may meet criteria for ICD for secondary prevention, but currently has multiple co-morbidities and unclear prognosis confounded her candidacy.  In discussion with Dr. Lovena Le, will need to consider LifeVest for secondary prevention on discharge and follow her course over the next several months for further consideration of ICD  2. Acute systolic CHF Recent EF close to 40-45 / 45-50% GDMT may be limited by poor oral intake and hypotension, but so far she is tolerated coreg, spiro, and losartan NICM by cath 05/11/2021  3. DVT/Renal infarct RLE DVT and left internal iliac DVT on admission preceding arrest.  She is on heparin and will likely need treatment dose OAC on discharge  4. Electrolyte abnormalities  GI following for possibility of poor absorption and low Hgb.   5. Anemia Hgb 7.3 this am. Multifactorial.   6. Failure to Thrive / Severe Malnutrition With anasarca and muscle depletion Currently on TPN with failure to tolerate post-pyloric feeding tube.  Planning on potentially stopping today if she is able to tolerate "most" of her lunch and dinner"  7. [place] She has multiple severe co-morbidities and an unclear prognosis. It is not clear what functional status she will be able to get back to.  Palliative care consultation would be more than reasonable.   For questions or updates, please contact [place] Please consult [email or web address] for contact info under Cardiology/STEMI.  Jacalyn Lefevre, PA-C  05/14/2021 10:31 AM  EP Attending  Patient seen and examined. Agree with the findings as noted above. The patient presents with PMVT and severe QT prolongation in the setting of hypokalemia and new LV dysfunction. On exam, she is a pleasant 70 yo woman who looks older than her stated age and CV with a RRR  and a soft S4. Lungs are clear. Ext without edema. Tele demonstrates NSR and prolonged QT. ECG reveals markedly prolonged QT with T wave alternans.   A/P  VF arrest - With a VF arrest, ICD implantation is indicated. However she has multiple comorbidites and is currently on TPN and has had an overall failure to thrive.  I do not think that she is a candidate for ICD insertion at the present time but would consider a Life Vest  for 3 months allowing time for her to either improve or declare how her clinical course is going to progress.  Long QT - in addition to electrolyte repletion, she will need nadolol or propranolol.   Carleene Overlie Tyller [place],MD

## 2021-05-14 NOTE — Progress Notes (Signed)
ANTICOAGULATION CONSULT NOTE - Follow Up Consult  Pharmacy Consult for Heparin Indication: DVT  No Known Allergies  Patient Measurements: Height: 5\' 1"  (154.9 cm) Weight: 57.6 kg (126 lb 15.8 oz) IBW/kg (Calculated) : 47.8 Heparin Dosing Weight: 61.5 kg  Vital Signs: Temp: 98.8 F (37.1 C) (11/14 0343) Temp Source: Oral (11/14 0343) BP: 111/61 (11/14 0838) Pulse Rate: 97 (11/14 0838)  Labs: Recent Labs    05/12/21 0400 05/12/21 1620 05/13/21 0455 05/14/21 0400 05/14/21 0554  HGB 8.1*  --  7.3* 7.3*  --   HCT 22.9*  --  20.2* 21.5*  --   PLT 200  --  194 249  --   APTT 85*  --  103*  --   --   HEPARINUNFRC 0.69  --  0.67  --  0.46  CREATININE 0.45   < > 0.45 0.53 0.53   < > = values in this interval not displayed.     Estimated Creatinine Clearance: 55.7 mL/min (by C-G formula based on SCr of 0.53 mg/dL).   Medications:  Scheduled:   sodium chloride   Intravenous Once   carvedilol  6.25 mg Oral BID WC   Chlorhexidine Gluconate Cloth  6 each Topical Daily   feeding supplement  1 Container Oral TID BM   furosemide  80 mg Intravenous Q12H   losartan  25 mg Oral Daily   mouth rinse  15 mL Mouth Rinse BID   megestrol  400 mg Oral BID   sodium chloride flush  10-40 mL Intracatheter Q12H   sodium chloride flush  3 mL Intravenous Q12H   spironolactone  25 mg Oral Daily   sucralfate  1 g Oral BID   thiamine  100 mg Oral Daily   vitamin A  10,000 Units Oral Daily   Infusions:   sodium chloride 10 mL/hr at 05/11/21 2100   heparin 700 Units/hr (05/14/21 0530)   magnesium sulfate bolus IVPB     TPN ADULT (ION) 30 mL/hr at 05/14/21 0530   TPN ADULT (ION)      Assessment: 70 yo female with DVT and renal infarct on apixaban (Last dose of apixaban 10 mg given 11/9 at ~10am). She is noted s/p cardiac arrest 11/7 and and EF 25-30%.  She is on TPN for malabsorption and also changing to IV heparin.  She is now s/p cath with non-obstructive CAD. Heparin restarted  post-cath. Using aPTTs for heparin monitoring. Elevated heparin level with recent apixaban.  Heparin level continues to be therapeutic. Hgb remains low at 7.3. Plt wnl.  Goal of Therapy:  Heparin level 0.3-0.7 units/ml aPTT 66-102 seconds Monitor platelets by anticoagulation protocol: Yes   Plan:  Continue heparin at 700 units/hr Daily HL, CBC, s/s bleeding  Onnie Boer, PharmD, BCIDP, AAHIVP, CPP Infectious Disease Pharmacist 05/14/2021 1:11 PM

## 2021-05-14 NOTE — Progress Notes (Signed)
PROGRESS NOTE  Debbie Barry NWG:[id number] DOB: Aug 31, 1950   PCP: Glendale Chard, MD  Patient is from: SNF.  Uses walker at baseline.  DOA: 05/02/2021 LOS: 74  Chief complaints:  Chief Complaint  Patient presents with   Chest Pain   Abdominal Pain     Brief Narrative / Interim history: 70 year old F with PMH of fatty liver, osteomyelitis, HTN, GERD, ambulatory dysfunction and a recent hospitalization for lower extremity weakness due to spinal stenosis for which medical management was recommended and she was discharged to SNF returning to ED with chest pain and palpitation, and found to have age-indeterminate RLE DVT, large bilateral pleural effusion, anasarca, acute right renal infarct comprising less than 10% of right renal cortex.  She was started on IV heparin.  Pleural effusion and anasarca was felt to be due to malnutrition/hypoalbuminemia.  She underwent right Thora with removal of 500 cc transudative culture negative fluid.  Cytology was negative as well.  Started on diuretics.   Patient went into V. fib cardiac arrest the morning of 11/7 with ROSC after 3 minutes of CPR prior to defibrillation.  Then, she went into pulsatile VT and had 1 DCCV.  She was transferred to ICU after cardiology consultation.  K and Mg were found to be low.  TTE with LVEF of 40 to 45% and RWMA on 11/7.Marland Kitchen  Repeat TTE on 11/9 with LVEF of 25 to 30%, GH, G1 DD and moderately reduced RV SF.  R/LHC on 11/11 with normal right heart pressures and without obstructive CAD suggesting NICM.  And also started on TPN on 11/9.    GI consulted for malnutrition.  Concern about protein-losing enteropathy. Extensive work-up significant for low vitamin A, transferrin and ceruloplasmin  but negative for C. difficile, GIP, Giardia, crypto, A1A, and celiac disease.  Diarrhea that has resolved with cholestyramine.  Started taking p.o.  Weaning off TPN.  GI signed off.  TRH assumed care on 11/13.  Subjective: Seen and examined  earlier this morning.  No major events overnight of this morning.  She she says her heart rate went up and she felt winded and lightheaded on transfer from bed to bedside chair.  Did not have chest pain.  Had bowel movements last night and this morning.  Did not notice blood or melena.  H&H remains low but stable at 7.3.  She agrees to proceed with blood transfusion.  She did not eat much of a breakfast.  She says it was to call when she got out of the bed.  Objective: Vitals:   05/14/21 0338 05/14/21 0343 05/14/21 0730 05/14/21 0838  BP:  (!) 99/55 115/62 111/61  Pulse:  89 (!) 101 97  Resp:  16 15   Temp:  98.8 F (37.1 C)    TempSrc:  Oral    SpO2:  97% 96%   Weight: 56.5 kg  57.6 kg   Height:        Intake/Output Summary (Last 24 hours) at 05/14/2021 1107 Last data filed at 05/14/2021 0849 Gross per 24 hour  Intake 1255.07 ml  Output 400 ml  Net 855.07 ml   Filed Weights   05/13/21 0500 05/14/21 0338 05/14/21 0730  Weight: 51.8 kg 56.5 kg 57.6 kg    Examination:  GENERAL: Frail looking elderly female.  Sitting on bedside chair. HEENT: MMM.  Vision and hearing grossly intact.  NECK: Supple.  Notable JVD. RESP: 97% on RA.  No IWOB.  Fair aeration bilaterally. CVS:  RRR. Heart sounds  normal.  ABD/GI/GU: BS+. Abd soft, NTND.  MSK/EXT:  Moves extremities. No apparent deformity.  Trace BLE edema and dependent edema. SKIN: no apparent skin lesion or wound NEURO: Awake and alert. Oriented appropriately.  No apparent focal neuro deficit. PSYCH: Calm. Normal affect.   Procedures:  11/3-right thoracocentesis with removal of 500 cc  Microbiology summarized: 11/03-right Thora with removal of 500 cc transudative and culture negative fluid.  Cytology negative as well. 11/07-3 minutes of CPR for VF.  Pulsatile VT s/p DCCV about 2 hours later. 11/11-R/LHC with normal right heart pressures and nonobstructive CAD  Assessment & Plan: Ventricular fibrillation/torsades followed by  VT/prolonged QT: Likely secondary to electrolyte derangements in the setting of aggressive diuresis.  ROSC after 3 minutes of CPR.  Had pulsatile VT after ROSC see requiring 1 DCCV.  TTE with LVEF of 40 to 45% and RWMA on 11/7.Marland Kitchen  Repeat TTE on 11/9 with LVEF of 25 to 30%, GH, G1-DD and moderately reduced RV SF.  R/LHC on 11/11 with normal right heart pressures and without obstructive CAD suggesting NICM. -Cardiology following. -Seems EP is on board as well  Acute on chronic combined CHF: TTE and R/LHC as above.  She had bilateral pleural effusion and anasarca on presentation.  Still with residual anasarca/dependent edema.  Only 600 cc with an measured 3 voids charted over the last 24 hours.  Fluid status seems to have improved clinically. -Cardiology following and managing. -On IV Lasix 80 mg twice daily.  We will give IV albumin 25 g x 1 -GDMT-BB, losartan, Aldactone -Monitor fluid status, renal functions and electrolytes. -Sodium and fluid restriction  Anasarca: Multifactorial including CHF and severe malnutrition with severe hypoalbuminemia.  Concern for protein-losing nephropathy.  Extensive work-up by GI as above.  Improved. -Diuretics as above.   -IV albumin for hypoalbuminemia which could also help with diuresis -Optimize nutrition-see below  Acute respiratory failure with hypoxia-in the setting of fluid overload and bilateral pleural effusion.  Resolved. -Manage CHF/fluid overload as above. -Encourage IS, OOB/PT/OT  Hyponatremia: Likely from hypervolemia.  Improved.   Hypokalemia/hypomagnesemia/hypophosphatemia: Resolved.  K6.2 (hemolyzed sample).  5.1 on repeat.  -Recheck in the morning  Symptomatic normocytic anemia: Hgb 12 on presentation.  Slowly drifting.  No report of bleeding anywhere.  Could be anemia of critical illness and malnutrition.  However, she is also on IV heparin for DVT.  No iron deficiency anemia arguing against chronic blood loss.  However, low transferrin  suggesting poor GI absorption. Recent Labs    05/07/21 0855 05/08/21 0520 05/09/21 0635 05/10/21 0440 05/11/21 0317 05/11/21 1007 05/11/21 1012 05/12/21 0400 05/13/21 0455 05/14/21 0400  HGB 10.2* 9.2* 9.6* 8.3* 8.9* 9.5* 9.9*  9.9* 8.1* 7.3* 7.3*  -FOBT ordered on 11/13.  3 BMs but stool sample was not sent.  Requested RN to send stool sample -Transfuse 1 unit.  Patient is symptomatic -Continue monitoring  Spinal stenosis/lower extremity weakness/ambulatory dysfunction -PT/OT  Right lower extremity DVT-age-indeterminate DVT extending all the way to the iliac vessels -Continue IV heparin pending Hemoccult.  Acute right renal infarct-involving about 10% of right renal cortex.  From arrhythmia?  Shunt?  Renal function stable. -Continue IV heparin  Severe malnutrition as evidenced by anasarca and severe hypoalbuminemia. -P.o. intake improved but did not eat much of her breakfast today -Continue weaning off TPN.  Continue 50% today with a plan to stop altogether if she eats most of her lunch and dinner. Body mass index is 23.99 kg/m. Nutrition Problem: Severe Malnutrition Etiology: chronic  illness (chronic N/V/D) Signs/Symptoms: severe muscle depletion, energy intake < or equal to 50% for > or equal to 1 month Interventions: TPN   DVT prophylaxis:  SCD's Start: 05/11/21 1201 Place and maintain sequential compression device Start: 05/03/21 1316 Patient is on IV heparin for DVT.  Code Status: Full code Family Communication: Updated patient's husband over the phone. Level of care: Telemetry Cardiac Status is: Inpatient  Remains inpatient appropriate because: Fluid overload/CHF exacerbation requiring IV diuretics,  VTE requiring IV heparin and severe malnutrition requiring TPN   Consultants:  Cardiology Electrophysiology Pulmonology Gastroenterology   Sch Meds:  Scheduled Meds:  sodium chloride   Intravenous Once   carvedilol  6.25 mg Oral BID WC   Chlorhexidine  Gluconate Cloth  6 each Topical Daily   furosemide  80 mg Intravenous Q12H   losartan  25 mg Oral Daily   mouth rinse  15 mL Mouth Rinse BID   megestrol  400 mg Oral BID   potassium chloride  40 mEq Oral BID   sodium chloride flush  10-40 mL Intracatheter Q12H   sodium chloride flush  3 mL Intravenous Q12H   spironolactone  25 mg Oral Daily   sucralfate  1 g Oral BID   thiamine  100 mg Oral Daily   vitamin A  10,000 Units Oral Daily   Continuous Infusions:  sodium chloride 10 mL/hr at 05/11/21 2100   albumin human     heparin 700 Units/hr (05/14/21 0530)   magnesium sulfate bolus IVPB     TPN ADULT (ION) 30 mL/hr at 05/14/21 0530   TPN ADULT (ION)     PRN Meds:.sodium chloride, acetaminophen, docusate sodium, Gerhardt's butt cream, HYDROcodone-acetaminophen, hydrOXYzine, melatonin, sodium chloride flush  Antimicrobials: Anti-infectives (From admission, onward)    None        I have personally reviewed the following labs and images: CBC: Recent Labs  Lab 05/10/21 0440 05/11/21 0317 05/11/21 1007 05/11/21 1012 05/12/21 0400 05/13/21 0455 05/14/21 0400  WBC 6.3 7.0  --   --  7.7 7.2 7.6  HGB 8.3* 8.9* 9.5* 9.9*  9.9* 8.1* 7.3* 7.3*  HCT 23.6* 24.6* 28.0* 29.0*  29.0* 22.9* 20.2* 21.5*  MCV 94.4 93.5  --   --  94.2 93.5 97.3  PLT 209 224  --   --  200 194 249   BMP &GFR Recent Labs  Lab 05/11/21 0317 05/11/21 1007 05/12/21 0400 05/12/21 1620 05/13/21 0455 05/14/21 0400 05/14/21 0554  NA 137   < > 134* 134* 133* 132* 134*  K 3.6   < > 3.5 3.6 3.8 6.2* 5.1  CL 96*   < > 95* 96* 98 102 101  CO2 36*   < > 34* 32 31 26 26   GLUCOSE 106*   < > 111* 108* 113* 446* 102*  BUN 5*   < > 9 10 13 15 16   CREATININE 0.44   < > 0.45 0.42* 0.45 0.53 0.53  CALCIUM 7.6*   < > 7.6* 7.7* 7.7* 8.3* 8.6*  MG 1.8  --  1.6* 2.1 2.0 2.2 1.7  PHOS 2.7  --  2.7  --  2.2* 4.9* 3.7   < > = values in this interval not displayed.   Estimated Creatinine Clearance: 55.7 mL/min (by  C-G formula based on SCr of 0.53 mg/dL). Liver & Pancreas: Recent Labs  Lab 05/07/21 2343 05/10/21 0440 05/14/21 0400 05/14/21 0554  AST 29 22 31 31   ALT 16 14 21 19   ALKPHOS  96 92 111 125  BILITOT 0.8 0.6 <0.1* 0.5  PROT 4.0* 4.3* 4.2* 4.4*  ALBUMIN 1.7* 1.9* 1.6* 1.7*   No results for input(s): LIPASE, AMYLASE in the last 168 hours. No results for input(s): AMMONIA in the last 168 hours. Diabetic: No results for input(s): HGBA1C in the last 72 hours. Recent Labs  Lab 05/12/21 0621 05/12/21 1158 05/13/21 0626 05/14/21 0100 05/14/21 0706  GLUCAP 124* 134* 118* 138* 105*   Cardiac Enzymes: No results for input(s): CKTOTAL, CKMB, CKMBINDEX, TROPONINI in the last 168 hours. No results for input(s): PROBNP in the last 8760 hours. Coagulation Profile: No results for input(s): INR, PROTIME in the last 168 hours. Thyroid Function Tests: No results for input(s): TSH, T4TOTAL, FREET4, T3FREE, THYROIDAB in the last 72 hours. Lipid Profile: Recent Labs    05/14/21 0400 05/14/21 0554  TRIG 325* 123   Anemia Panel: No results for input(s): VITAMINB12, FOLATE, FERRITIN, TIBC, IRON, RETICCTPCT in the last 72 hours. Urine analysis:    Component Value Date/Time   COLORURINE AMBER (A) 05/02/2021 2100   APPEARANCEUR CLOUDY (A) 05/02/2021 2100   LABSPEC 1.026 05/02/2021 2100   PHURINE 6.0 05/02/2021 2100   GLUCOSEU NEGATIVE 05/02/2021 2100   HGBUR LARGE (A) 05/02/2021 2100   BILIRUBINUR NEGATIVE 05/02/2021 2100   BILIRUBINUR negative 03/30/2020 [place] 05/02/2021 2100   PROTEINUR 100 (A) 05/02/2021 2100   UROBILINOGEN 0.2 03/30/2020 1215   NITRITE NEGATIVE 05/02/2021 2100   LEUKOCYTESUR MODERATE (A) 05/02/2021 2100   Sepsis Labs: Invalid input(s): PROCALCITONIN, [place]  Microbiology: Recent Results (from the past 240 hour(s))  MRSA Next Gen by PCR, Nasal     Status: None   Collection Time: 05/07/21  1:27 PM   Specimen: Nasal Mucosa; Nasal Swab   Result Value Ref Range Status   MRSA by PCR Next Gen NOT DETECTED NOT DETECTED Final    Comment: (NOTE) The GeneXpert MRSA Assay (FDA approved for NASAL specimens only), is one component of a comprehensive MRSA colonization surveillance program. It is not intended to diagnose MRSA infection nor to guide or monitor treatment for MRSA infections. Test performance is not FDA approved in patients less than 31 years old. Performed at [facility], [place] [address], [place], [place] [address]   Giardia/Cryptosporidium EIA     Status: None   Collection Time: 05/08/21  2:48 PM   Specimen: Per Rectum; Stool  Result Value Ref Range Status   Giardia Ag, Stl Negative Negative Final   Cryptosporidium EIA Negative Negative Final    Comment: (NOTE) Performed At: [facility] [place], [place] [id number] Rush Farmer MD NA:[id number]    Source of Sample STOOL  Final    Comment: Performed at [facility], [place] [address] [place], [place] [address]  C Difficile Quick Screen (NO PCR Reflex)     Status: None   Collection Time: 05/08/21  2:48 PM   Specimen: Per Rectum; Stool  Result Value Ref Range Status   C Diff antigen NEGATIVE NEGATIVE Final   C Diff toxin NEGATIVE NEGATIVE Final   C Diff interpretation No C. difficile detected.  Final    Comment: Performed at [facility], [place] [address], [place], [place] [address]  Gastrointestinal Panel by PCR , Stool     Status: None   Collection Time: 05/08/21  2:48 PM   Specimen: Per Rectum; Stool  Result Value Ref Range Status   Campylobacter species NOT DETECTED NOT  DETECTED Final   Plesimonas shigelloides NOT DETECTED NOT DETECTED Final   Salmonella species NOT DETECTED NOT DETECTED Final   Yersinia enterocolitica NOT DETECTED NOT DETECTED Final   Vibrio species NOT DETECTED NOT DETECTED Final   Vibrio cholerae NOT DETECTED NOT DETECTED Final   Enteroaggregative E coli (EAEC) NOT DETECTED NOT DETECTED Final    Enteropathogenic E coli (EPEC) NOT DETECTED NOT DETECTED Final   Enterotoxigenic E coli (ETEC) NOT DETECTED NOT DETECTED Final   Shiga like toxin producing E coli (STEC) NOT DETECTED NOT DETECTED Final   Shigella/Enteroinvasive E coli (EIEC) NOT DETECTED NOT DETECTED Final   Cryptosporidium NOT DETECTED NOT DETECTED Final   Cyclospora cayetanensis NOT DETECTED NOT DETECTED Final   Entamoeba histolytica NOT DETECTED NOT DETECTED Final   Giardia lamblia NOT DETECTED NOT DETECTED Final   Adenovirus F40/41 NOT DETECTED NOT DETECTED Final   Astrovirus NOT DETECTED NOT DETECTED Final   Norovirus GI/GII NOT DETECTED NOT DETECTED Final   Rotavirus A NOT DETECTED NOT DETECTED Final   Sapovirus (I, II, IV, and V) NOT DETECTED NOT DETECTED Final    Comment: Performed at [facility], [address] [place], [place] [address]  Culture, blood (Routine X 2) w Reflex to ID Panel     Status: None   Collection Time: 05/09/21 10:53 AM   Specimen: BLOOD LEFT ARM  Result Value Ref Range Status   Specimen Description BLOOD LEFT ARM  Final   Special Requests   Final    BOTTLES DRAWN AEROBIC ONLY Blood Culture results may not be optimal due to an inadequate volume of blood received in culture bottles   Culture   Final    NO GROWTH 5 DAYS Performed at [place] [address], [place], [place] [address]    Report Status 05/14/2021 FINAL  Final    Radiology Studies: DG Abd 1 View  Result Date: 05/14/2021 CLINICAL DATA:  Abdominal pain and diarrhea. EXAM: ABDOMEN - 1 VIEW COMPARISON:  Abdomen pelvis CT 05/02/2021 FINDINGS: Mild gaseous distention of the stomach. Scattered small bowel and colonic gas evident without obstructive pattern. Prominent stool volume in the rectum. Cluster of radiodensities in the left upper quadrant are presumably related to ingested material in the stomach as no calcifications are seen in the left abdomen on recent CT scan. IMPRESSION: 1. Nonobstructive bowel  gas pattern. 2. Mild gaseous distention of the stomach. 3. Prominent stool volume in the rectum. Electronically Signed   By: Misty Stanley M.D.   On: 05/14/2021 10:30     Linus Weckerly T. [place]  If 7PM-7AM, please contact night-coverage [email or web address] 05/14/2021, 11:07 AM

## 2021-05-14 NOTE — Care Management Important Message (Signed)
Important Message  Patient Details  Name: Debbie Barry MRN: [id number] Date of Birth: 1951-02-24   Medicare Important Message Given:  Yes     Karam Dunson 05/14/2021, 1:40 PM

## 2021-05-15 DIAGNOSIS — I469 Cardiac arrest, cause unspecified: Secondary | ICD-10-CM | POA: Diagnosis not present

## 2021-05-15 DIAGNOSIS — I420 Dilated cardiomyopathy: Secondary | ICD-10-CM | POA: Diagnosis not present

## 2021-05-15 DIAGNOSIS — R9431 Abnormal electrocardiogram [ECG] [EKG]: Secondary | ICD-10-CM | POA: Diagnosis not present

## 2021-05-15 DIAGNOSIS — N280 Ischemia and infarction of kidney: Secondary | ICD-10-CM | POA: Diagnosis not present

## 2021-05-15 DIAGNOSIS — K5900 Constipation, unspecified: Secondary | ICD-10-CM

## 2021-05-15 LAB — URINALYSIS, ROUTINE W REFLEX MICROSCOPIC
Bacteria, UA: NONE SEEN
Bilirubin Urine: NEGATIVE
Glucose, UA: NEGATIVE mg/dL
Hgb urine dipstick: NEGATIVE
Ketones, ur: NEGATIVE mg/dL
Nitrite: NEGATIVE
Protein, ur: NEGATIVE mg/dL
Specific Gravity, Urine: 1.008 (ref 1.005–1.030)
pH: 5 (ref 5.0–8.0)

## 2021-05-15 LAB — BASIC METABOLIC PANEL
Anion gap: 8 (ref 5–15)
BUN: 17 mg/dL (ref 8–23)
CO2: 25 mmol/L (ref 22–32)
Calcium: 8.7 mg/dL — ABNORMAL LOW (ref 8.9–10.3)
Chloride: 103 mmol/L (ref 98–111)
Creatinine, Ser: 0.52 mg/dL (ref 0.44–1.00)
GFR, Estimated: >60 mL/min (ref >60)
Glucose, Bld: 115 mg/dL — ABNORMAL HIGH (ref 70–99)
Potassium: 4.4 mmol/L (ref 3.5–5.1)
Sodium: 136 mmol/L (ref 135–145)

## 2021-05-15 LAB — CBC
HCT: 25.6 % — ABNORMAL LOW (ref 36.0–46.0)
Hemoglobin: 9 g/dL — ABNORMAL LOW (ref 12.0–15.0)
MCH: 33 pg (ref 26.0–34.0)
MCHC: 35.2 g/dL (ref 30.0–36.0)
MCV: 93.8 fL (ref 80.0–100.0)
Platelets: 269 10*3/uL (ref 150–400)
RBC: 2.73 MIL/uL — ABNORMAL LOW (ref 3.87–5.11)
RDW: 17 % — ABNORMAL HIGH (ref 11.5–15.5)
WBC: 7.2 10*3/uL (ref 4.0–10.5)
nRBC: 0.7 % — ABNORMAL HIGH (ref 0.0–0.2)

## 2021-05-15 LAB — PROTEIN / CREATININE RATIO, URINE
Creatinine, Urine: 11.81 mg/dL
Total Protein, Urine: <6 mg/dL

## 2021-05-15 LAB — BPAM RBC
Blood Product Expiration Date: 202212042359
ISSUE DATE / TIME: 202211141438
Unit Type and Rh: 5100

## 2021-05-15 LAB — TYPE AND SCREEN
ABO/RH(D): O POS
Antibody Screen: NEGATIVE
Unit division: 0

## 2021-05-15 LAB — GLUCOSE, CAPILLARY
Glucose-Capillary: 101 mg/dL — ABNORMAL HIGH (ref 70–99)
Glucose-Capillary: 109 mg/dL — ABNORMAL HIGH (ref 70–99)

## 2021-05-15 LAB — APTT: aPTT: 65 seconds — ABNORMAL HIGH (ref 24–36)

## 2021-05-15 LAB — OCCULT BLOOD X 1 CARD TO LAB, STOOL: Fecal Occult Bld: NEGATIVE

## 2021-05-15 LAB — HEPARIN LEVEL (UNFRACTIONATED): Heparin Unfractionated: 0.38 IU/mL (ref 0.30–0.70)

## 2021-05-15 LAB — MAGNESIUM: Magnesium: 2 mg/dL (ref 1.7–2.4)

## 2021-05-15 MED ORDER — OXYCODONE HCL 5 MG PO TABS
5.0000 mg | ORAL_TABLET | Freq: Two times a day (BID) | ORAL | Status: DC | PRN
  Administered 2021-05-15 – 2021-05-21 (×5): 5 mg via ORAL
  Filled 2021-05-15 (×6): qty 1

## 2021-05-15 MED ORDER — ADULT MULTIVITAMIN W/MINERALS CH
1.0000 | ORAL_TABLET | Freq: Every day | ORAL | Status: DC
  Administered 2021-05-16 – 2021-05-22 (×7): 1 via ORAL
  Filled 2021-05-15 (×7): qty 1

## 2021-05-15 MED ORDER — APIXABAN 5 MG PO TABS
5.0000 mg | ORAL_TABLET | Freq: Two times a day (BID) | ORAL | Status: DC
  Administered 2021-05-22: 5 mg via ORAL
  Filled 2021-05-15: qty 1

## 2021-05-15 MED ORDER — ACETAMINOPHEN 500 MG PO TABS
1000.0000 mg | ORAL_TABLET | Freq: Four times a day (QID) | ORAL | Status: DC | PRN
  Administered 2021-05-15 – 2021-05-22 (×15): 1000 mg via ORAL
  Filled 2021-05-15 (×15): qty 2

## 2021-05-15 MED ORDER — APIXABAN 5 MG PO TABS
10.0000 mg | ORAL_TABLET | Freq: Two times a day (BID) | ORAL | Status: AC
  Administered 2021-05-15 – 2021-05-21 (×14): 10 mg via ORAL
  Filled 2021-05-15 (×14): qty 2

## 2021-05-15 MED ORDER — STERILE WATER FOR INJECTION IV SOLN
INTRAVENOUS | Status: AC
  Filled 2021-05-15: qty 374.4

## 2021-05-15 NOTE — Progress Notes (Deleted)
NEUROLOGY CONSULTATION NOTE  Debbie Barry MRN: [id number] DOB: [birth year]  Referring provider: Lurline Del, MD Primary care provider: Glendale Chard, MD  Reason for consult:  frequent falls  Assessment/Plan:   Frequent falls - Iron overload - unclear etiology.  Workup negative for hemochromatosis and thalassemia and patient not taking iron supplements.   Subjective:  Debbie Barry is a 70 year old female with *** who presents for frequent falls.  History supplemented by referring provider's notes.  She was admitted to the hospital in February 2022 for upper GI bleed presenting as weakness, generalized abdominal pain and hematemesis. EGD revealed non-bleeding gastric ulcer.  She was treated for H. Pylori and symptoms and Hgb stabilized.  However, she continued to feel weak and started falling several times a week.  She started using a cane and walker.  She has developed numbness in the feet and hands.  ***.  Ferritin level was found to be 1450 with iron saturation of 81%.  She followed up with heme/onc who was concerned about hemochromatosis.  However, DNA analysis did not reveal mutation.  LFTs demonstrated normal bilirubin, ALP 139, AST 53 and ALT 40.  CT lumbar spine on 02/22/2021 personally reviewed revealed recent moderately severe compression fracture of L3 and L4.  CT head personally reviewed revealed no acute abnormality.       PAST MEDICAL HISTORY: Past Medical History:  Diagnosis Date   Fatty liver    GERD (gastroesophageal reflux disease)    Hypertension    Osteomyelitis ([place])    right third toe   Peripheral vascular disease ([place])    Ulcer 08/14/20    PAST SURGICAL HISTORY: Past Surgical History:  Procedure Laterality Date   AMPUTATION Right 08/08/2017   Procedure: AMPUTATION RIGHT 3RD TOE;  Surgeon: Newt Minion, MD;  Location: [facility];  Service: Orthopedics;  Laterality: Right;   BIOPSY  08/14/2020   Procedure: BIOPSY;  Surgeon: Clarene Essex, MD;   Location: [facility];  Service: Endoscopy;;   Bunionectomy Right 2006     Right   Bunionectony Left 2006      August   COLONOSCOPY N/A 01/21/2014   Procedure: COLONOSCOPY;  Surgeon: Beryle Beams, MD;  Location: [facility];  Service: Endoscopy;  Laterality: N/A;   ESOPHAGOGASTRODUODENOSCOPY (EGD) WITH PROPOFOL N/A 08/14/2020   Procedure: ESOPHAGOGASTRODUODENOSCOPY (EGD) WITH PROPOFOL;  Surgeon: Clarene Essex, MD;  Location: [facility];  Service: Endoscopy;  Laterality: N/A;   IR THORACENTESIS ASP PLEURAL SPACE W/IMG GUIDE  05/03/2021   RIGHT/LEFT HEART CATH AND CORONARY ANGIOGRAPHY N/A 05/11/2021   Procedure: RIGHT/LEFT HEART CATH AND CORONARY ANGIOGRAPHY;  Surgeon: Troy Sine, MD;  Location: [facility];  Service: Cardiovascular;  Laterality: N/A;   TUBAL LIGATION     WISDOM TOOTH EXTRACTION      MEDICATIONS: Current Facility-Administered Medications on File Prior to Visit  Medication Dose Route Frequency Provider Last Rate Last Admin   0.9 %  sodium chloride infusion  250 mL Intravenous PRN Wendee Beavers T, MD 10 mL/hr at 05/11/21 2100 Rate Verify at 05/11/21 2100   acetaminophen (TYLENOL) tablet 1,000 mg  1,000 mg Oral Q6H PRN Kristopher Oppenheim, DO   1,000 mg at 05/15/21 0108   bisacodyl (DULCOLAX) suppository 10 mg  10 mg Rectal Daily PRN Mercy Riding, MD       Chlorhexidine Gluconate Cloth 2 % PADS 6 each  6 each Topical Daily Mercy Riding, MD   6 each at 05/14/21 1427  diphenhydrAMINE (BENADRYL) capsule 25 mg  25 mg Oral QHS PRN Kristopher Oppenheim, DO   25 mg at 05/14/21 2254   docusate sodium (COLACE) capsule 50 mg  50 mg Oral Daily PRN Wendee Beavers T, MD   50 mg at 05/13/21 0941   feeding supplement (BOOST / RESOURCE BREEZE) liquid 1 Container  1 Container Oral TID BM Mercy Riding, MD   1 Container at 05/14/21 2119   furosemide (LASIX) injection 80 mg  80 mg Intravenous Q12H Wendee Beavers T, MD   80 mg at 05/14/21 2118   Gerhardt's butt cream   Topical TID PRN Vicie Mutters R, PA-C        heparin ADULT infusion 100 units/mL (25000 units/228m)  700 Units/hr Intravenous Continuous Gonfa, Taye T, MD 7 mL/hr at 05/15/21 0300 700 Units/hr at 05/15/21 0300   hydrOXYzine (ATARAX/VISTARIL) tablet 10 mg  10 mg Oral Q6H PRN GWendee BeaversT, MD   10 mg at 05/15/21 0025   losartan (COZAAR) tablet 25 mg  25 mg Oral Daily GWendee BeaversT, MD   25 mg at 05/14/21 0838   MEDLINE mouth rinse  15 mL Mouth Rinse BID GWendee BeaversT, MD   15 mL at 05/14/21 2137   megestrol (MEGACE) 400 MG/10ML suspension 400 mg  400 mg Oral BID GWendee BeaversT, MD   400 mg at 05/14/21 2119   melatonin tablet 3 mg  3 mg Oral QHS PRN GWendee BeaversT, MD   3 mg at 05/14/21 2254   nadolol (CORGARD) tablet 20 mg  20 mg Oral Daily TEvans Lance MD   20 mg at 05/14/21 1946   sodium chloride flush (NS) 0.9 % injection 10-40 mL  10-40 mL Intracatheter Q12H GWendee BeaversT, MD   10 mL at 05/14/21 2120   sodium chloride flush (NS) 0.9 % injection 3 mL  3 mL Intravenous Q12H GWendee BeaversT, MD   3 mL at 05/14/21 2120   sodium chloride flush (NS) 0.9 % injection 3 mL  3 mL Intravenous PRN GWendee BeaversT, MD       spironolactone (ALDACTONE) tablet 25 mg  25 mg Oral Daily GWendee BeaversT, MD   25 mg at 05/14/21 0838   sucralfate (CARAFATE) 1 GM/10ML suspension 1 g  1 g Oral BID GWendee BeaversT, MD   1 g at 05/14/21 2119   thiamine tablet 100 mg  100 mg Oral Daily GWendee BeaversT, MD   100 mg at 05/14/21 0838   TPN ADULT (ION)   Intravenous Continuous TPN DAlvira Philips RPH 30 mL/hr at 05/15/21 0300 Infusion Verify at 05/15/21 0300   vitamin A capsule 10,000 Units  10,000 Units Oral Daily GWendee BeaversT, MD   10,000 Units at 05/14/21 1238   Current Outpatient Medications on File Prior to Visit  Medication Sig Dispense Refill   acetaminophen (TYLENOL) 325 MG tablet Take 650 mg by mouth every 6 (six) hours as needed for moderate pain.     bismuth subsalicylate (PEPTO BISMOL) 262 MG/15ML suspension Take 30 mLs by mouth every 6 (six) hours as  needed for indigestion.     calcium carbonate (TUMS EX) 750 MG chewable tablet Chew 1 tablet by mouth daily as needed for heartburn.     Cholecalciferol (VITAMIN D3) 5000 units CAPS Take 5,000 Units by mouth daily.     feeding supplement (ENSURE ENLIVE / ENSURE PLUS) LIQD Take 237 mLs by mouth 3 (three) times daily between  meals. (Patient not taking: Reported on 05/03/2021) 237 mL    HYDROcodone-acetaminophen (NORCO) 5-325 MG tablet Take 1 tablet by mouth every 12 (twelve) hours as needed for moderate pain. 30 tablet 0   hydrOXYzine (VISTARIL) 25 MG capsule Take 1 capsule (25 mg total) by mouth 2 (two) times daily as needed. (Patient taking differently: Take 25 mg by mouth 2 (two) times daily as needed for anxiety.) 30 capsule 0   Magnesium Hydroxide (MILK OF MAGNESIA PO) Take 30 mLs by mouth daily as needed (constipation).     Multiple Vitamin (MULTIVITAMIN WITH MINERALS) TABS tablet Take 1 tablet by mouth daily.     ondansetron (ZOFRAN ODT) 4 MG disintegrating tablet Take 1 tablet (4 mg total) by mouth every 8 (eight) hours as needed for nausea or vomiting. 20 tablet 0   pantoprazole (PROTONIX) 40 MG tablet Take 1 tablet (40 mg total) by mouth daily. 30 tablet 0   potassium chloride SA (KLOR-CON) 20 MEQ tablet Take 1 tablet (20 mEq total) by mouth 2 (two) times daily. 60 tablet 3   sertraline (ZOLOFT) 25 MG tablet Take 1 tablet (25 mg total) by mouth daily. 90 tablet 1   Tetrahydrozoline HCl (VISINE OP) Place 2 drops into both eyes daily as needed (for dry/irritated eyes).       ALLERGIES: No Known Allergies  FAMILY HISTORY: Family History  Problem Relation Age of Onset   Breast cancer Mother    Colon cancer Mother    Heart Problems Mother    Hypertension Mother    Multiple myeloma Mother    Arthritis Mother    Cancer Mother    Heart disease Mother    Hypertension Father    Heart disease Father    Multiple myeloma Father    Arthritis Father    Cancer Father     Objective:   *** General: No acute distress.  Patient appears well-groomed.   Head:  Normocephalic/atraumatic Eyes:  fundi examined but not visualized Neck: supple, no paraspinal tenderness, full range of motion Back: No paraspinal tenderness Heart: regular rate and rhythm Lungs: Clear to auscultation bilaterally. Vascular: No carotid bruits. Neurological Exam: Mental status: alert and oriented to person, place, and time, recent and remote memory intact, fund of knowledge intact, attention and concentration intact, speech fluent and not dysarthric, language intact. Cranial nerves: CN I: not tested CN II: pupils equal, round and reactive to light, visual fields intact CN III, IV, VI:  full range of motion, no nystagmus, no ptosis CN V: facial sensation intact. CN VII: upper and lower face symmetric CN VIII: hearing intact CN IX, X: gag intact, uvula midline CN XI: sternocleidomastoid and trapezius muscles intact CN XII: tongue midline Bulk & Tone: normal, no fasciculations. Motor:  muscle strength 5/5 throughout Sensation:  Pinprick, temperature and vibratory sensation intact. Deep Tendon Reflexes:  2+ throughout,  toes downgoing.   Finger to nose testing:  Without dysmetria.   Heel to shin:  Without dysmetria.   Gait:  Normal station and stride.  Romberg negative.    Thank you for allowing me to take part in the care of this patient.  Metta Clines, DO  CC: ***

## 2021-05-15 NOTE — Evaluation (Signed)
Occupational Therapy Evaluation Patient Details Name: Debbie Barry MRN: [id number] DOB: [birth year] Today's Date: 05/15/2021   History of Present Illness Pt is a 70 y.o. female admitted from SNF on 05/02/21 with chest pain; found to have DVT, AKI, CHF, large bilateral pleural effusions. S/p L-side thoracentesis on 11/3. Pt suffered vfib/vtach arrest on 11/7 requiring 3-min CPR and shock.  Respiratory distress on 11/8 requiring non-rebreather. S/p R/LHC on 11/11. PMH includes spinal stenosis, chronic back pain, CHF, HTN, PVD, obesity; of note, recent admission 03/2021 for BLE weakness, found to have chronic L3-4 compression fx, severe L4-5 canal stenosis with plan for conservative management with lumbar brace.   Clinical Impression   Pt from SNF and getting assist for ADL and reports not having been walking since early Oct. Today she was mod A +2 for bed mobility and transfers. She repeatedly says "I am going to fall I am going to fall" while in standing. Able to perform sit<>stand x2 and stand pivot transfer x2 with mod A +2. Max A +3 for peri care in standing. Total A for LB ADL and mod A to set up for UB ADL at this time. Pt does not want to go back to SNF and would prefer [facility] - however OT recommending SNF at this time to maximize safety and independence in ADL and functional transfers. If she does go home she will need [facility] lift, hospital bed.       Recommendations for follow up therapy are one component of a multi-disciplinary discharge planning process, led by the attending physician.  Recommendations may be updated based on patient status, additional functional criteria and insurance authorization.   Follow Up Recommendations  Skilled nursing-short term rehab (<3 hours/day) (Pt does not want to return to SNF - however OT recommends SNF)    Assistance Recommended at Discharge Frequent or constant Supervision/Assistance  Functional Status Assessment  Patient has had a recent decline in  their functional status and/or demonstrates limited ability to make significant improvements in function in a reasonable and predictable amount of time  Equipment Recommendations  Other (comment) (hoyer lift if going home)    Recommendations for Other Services PT consult     Precautions / Restrictions Precautions Precautions: Fall;Other (comment) Precaution Comments: bladder/bowel incontinence Required Braces or Orthoses: Spinal Brace Spinal Brace: Lumbar corset Other Brace: Lumbar corset from prior admission (not in room) Restrictions Weight Bearing Restrictions: No      Mobility Bed Mobility Overal bed mobility: Needs Assistance Bed Mobility: Supine to Sit     Supine to sit: Mod assist;HOB elevated     General bed mobility comments: Significant increased time and effort, repeated cues for sequencing of BLEs to EOB and hand placement; modA for trunk elevation with HHA, assist to scoot hips to EOB    Transfers Overall transfer level: Needs assistance Equipment used: Rolling walker (2 wheels) Transfers: Sit to/from Stand;Bed to chair/wheelchair/BSC Sit to Stand: Mod assist;+2 physical assistance     Step pivot transfers: Mod assist;+2 physical assistance;Max assist     General transfer comment: Multiple stands and step pivot transfer from bed>BSC>recliner, pt requiring consistent modA+2 to stand and maintain trunk elevation, external assist to manage RW and max, repeated verbal cues for sequencing, safety, and deep breathing as pt repeatedly states, "I'm going to fall, I'm going to fall...:"; maxA+2 for final stand from [facility], pt with increased fatigue. Repeated cues to achieve fully upright standing posture      Balance Overall balance assessment: Needs assistance Sitting-balance  support: No upper extremity supported;Feet unsupported Sitting balance-Leahy Scale: Poor Sitting balance - Comments: pt with significant trunk flexion, leaning R or L to prop on arms, unable to  sit up without assist. Postural control: Right lateral lean;Left lateral lean Standing balance support: Bilateral upper extremity supported;During functional activity Standing balance-Leahy Scale: Poor Standing balance comment: dependent on BUE support and assist; dependent for pericare                           ADL either performed or assessed with clinical judgement   ADL Overall ADL's : Needs assistance/impaired Eating/Feeding: Set up;Sitting   Grooming: Wash/dry hands;Wash/dry face;Set up;Sitting Grooming Details (indicate cue type and reason): on BSC Upper Body Bathing: Sitting;Moderate assistance Upper Body Bathing Details (indicate cue type and reason): for back Lower Body Bathing: Maximal assistance;Sitting/lateral leans;Sit to/from stand   Upper Body Dressing : Minimal assistance   Lower Body Dressing: Total assistance;+2 for physical assistance;+2 for safety/equipment;Sitting/lateral leans;Sit to/from stand   Toilet Transfer: Moderate assistance;+2 for safety/equipment;+2 for physical assistance;Cueing for safety;Stand-pivot;BSC/3in1;Rolling walker (2 wheels);Cueing for sequencing Toilet Transfer Details (indicate cue type and reason): fatigues quickly and shouting "I am going to fall" Toileting- Clothing Manipulation and Hygiene: Maximal assistance;Sit to/from stand;+2 for physical assistance;+2 for safety/equipment;Cueing for safety;Cueing for sequencing Toileting - Clothing Manipulation Details (indicate cue type and reason): able to maintain standing for 3rd person to complete peri care with 2 people for standing support and encouragement     Functional mobility during ADLs: +2 for safety/equipment;+2 for physical assistance;Cueing for safety;Cueing for sequencing;Moderate assistance;Rolling walker (2 wheels) General ADL Comments: Pt requires set-upA to [place] for ADL. Pt incontinent at this time requiring depends and pure wick to be changed often. Family not  able to maintain this assist at home.     Vision Baseline Vision/History: 0 No visual deficits Ability to See in Adequate Light: 0 Adequate Patient Visual Report: No change from baseline Vision Assessment?: No apparent visual deficits     Perception     Praxis      Pertinent Vitals/Pain Pain Assessment: Faces Faces Pain Scale: Hurts a little bit Breathing: normal Pain Location: back Pain Descriptors / Indicators: Discomfort Pain Intervention(s): Monitored during session;Repositioned;RN gave pain meds during session     Hand Dominance Right   Extremity/Trunk Assessment Upper Extremity Assessment Upper Extremity Assessment: Generalized weakness   Lower Extremity Assessment Lower Extremity Assessment: Defer to PT evaluation   Cervical / Trunk Assessment Cervical / Trunk Assessment: Kyphotic Cervical / Trunk Exceptions: chronic L3-4 compression fx.   Communication Communication Communication: No difficulties   Cognition Arousal/Alertness: Awake/alert Behavior During Therapy: Flat affect Overall Cognitive Status: No family/caregiver present to determine baseline cognitive functioning Area of Impairment: Attention;Memory;Following commands;Safety/judgement;Awareness;Problem solving                   Current Attention Level: Sustained Memory: Decreased short-term memory Following Commands: Follows one step commands inconsistently;Follows one step commands with increased time Safety/Judgement: Decreased awareness of safety;Decreased awareness of deficits Awareness: Intellectual Problem Solving: Slow processing;Decreased initiation;Difficulty sequencing;Requires verbal cues General Comments: Internally distracted by fear of falling and back pain; poor awareness requiring frequent, repeated cues for sequencing and safety. Difficult to determine extent of true cognitive impairment versus desire to mobilize     General Comments  SpO2 94-98% on RA, HR 85    Exercises      Shoulder Instructions      Home Living Family/patient expects to be  discharged to:: Skilled nursing facility Living Arrangements: Spouse/significant other Available Help at Discharge: Family;Available 24 hours/day Type of Home: House Home Access: Level entry     Home Layout: One level     Bathroom Shower/Tub: Occupational psychologist: Handicapped height Bathroom Accessibility: Yes   Home Equipment: Conservation officer, nature (2 wheels);Toilet riser;Wheelchair - manual;Shower seat   Additional Comments: Pt reports a w/c was delivered to her house yesterday.      Prior Functioning/Environment Prior Level of Function : Needs assist       Physical Assist : Mobility (physical);ADLs (physical) Mobility (physical): Bed mobility;Transfers ADLs (physical): Bathing;Dressing;Toileting;IADLs Mobility Comments: Since last hospitalization in October pt has been nonambulatory and unable to fully stand with 2 person assist. Pt requiring assist for bed mobility. ADLs Comments: Pt is dependent on husband for IADL, max A for ADL        OT Problem List: Decreased strength;Decreased range of motion;Impaired balance (sitting and/or standing);Decreased safety awareness;Decreased activity tolerance;Decreased knowledge of precautions;Decreased knowledge of use of DME or AE;Decreased coordination      OT Treatment/Interventions: Self-care/ADL training;Therapeutic exercise;DME and/or AE instruction;Therapeutic activities;Balance training;Patient/family education    OT Goals(Current goals can be found in the care plan section) Acute Rehab OT Goals Patient Stated Goal: get better OT Goal Formulation: With patient Time For Goal Achievement: 05/06/21 Potential to Achieve Goals: Good ADL Goals Pt Will Perform Grooming: with set-up;sitting Pt Will Perform Upper Body Dressing: with set-up;sitting Pt Will Perform Lower Body Dressing: with mod assist;sit to/from stand Pt Will Transfer to Toilet:  with mod assist;stand pivot transfer Pt Will Perform Toileting - Clothing Manipulation and hygiene: with mod assist;sit to/from stand Additional ADL Goal #1: Pt will perform OOB ADL tasks x 5 min with min A overall to increase activity tolerance  OT Frequency: Min 2X/week   Barriers to D/C: Decreased caregiver support          Co-evaluation PT/OT/SLP Co-Evaluation/Treatment: Yes Reason for Co-Treatment: To address functional/ADL transfers;Necessary to address cognition/behavior during functional activity;For patient/therapist safety PT goals addressed during session: Mobility/safety with mobility;Balance;Proper use of DME;Strengthening/ROM OT goals addressed during session: ADL's and self-care;Proper use of Adaptive equipment and DME;Strengthening/ROM      AM-PAC OT "6 Clicks" Daily Activity     Outcome Measure Help from another person eating meals?: A Little Help from another person taking care of personal grooming?: A Little Help from another person toileting, which includes using toliet, bedpan, or urinal?: A Lot Help from another person bathing (including washing, rinsing, drying)?: A Lot Help from another person to put on and taking off regular upper body clothing?: A Lot Help from another person to put on and taking off regular lower body clothing?: Total 6 Click Score: 13   End of Session Equipment Utilized During Treatment: Gait belt;Rolling walker (2 wheels);Oxygen Nurse Communication: Mobility status  Activity Tolerance: Patient tolerated treatment well (fear of falling and fatigue a factor) Patient left: in chair;with call bell/phone within reach;with chair alarm set  OT Visit Diagnosis: Unsteadiness on feet (R26.81);Other abnormalities of gait and mobility (R26.89);History of falling (Z91.81);Muscle weakness (generalized) (M62.81)                Time: 1224-8250 OT Time Calculation (min): 36 min Charges:  OT General Charges $OT Visit: 1 Visit OT Evaluation $OT Eval  Moderate Complexity: [place] OTR/L Acute Rehabilitation Services Pager: [phone] Office: [place] 05/15/2021, 2:10 PM

## 2021-05-15 NOTE — Progress Notes (Signed)
Primary Cardologist:  Oneal  Subjective:   No cardiac complaints   Objective:  Vitals:   05/14/21 2335 05/14/21 2336 05/15/21 0400 05/15/21 0740  BP: (!) 99/53 109/64 126/67 127/67  Pulse:  85 79 82  Resp:  14 17 16   Temp:  98.4 F (36.9 C) 98.2 F (36.8 C) 98.2 F (36.8 C)  TempSrc:  Oral Oral Oral  SpO2:  95% 96% 98%  Weight:      Height:        Intake/Output from previous day:  Intake/Output Summary (Last 24 hours) at 05/15/2021 0845 Last data filed at 05/15/2021 0600 Gross per 24 hour  Intake 1673.43 ml  Output 1600 ml  Net 73.43 ml    Physical Exam: Chronically ill black female No murmur  Lungs clear anteriorly Plus one edema BS positive    Lab Results: Basic Metabolic Panel: Recent Labs    05/14/21 0400 05/14/21 0554 05/15/21 0435  NA 132* 134* 136  K 6.2* 5.1 4.4  CL 102 101 103  CO2 26 26 25   GLUCOSE 446* 102* 115*  BUN 15 16 17   CREATININE 0.53 0.53 0.52  CALCIUM 8.3* 8.6* 8.7*  MG 2.2 1.7 2.0  PHOS 4.9* 3.7  --    Liver Function Tests: Recent Labs    05/14/21 0400 05/14/21 0554  AST 31 31  ALT 21 19  ALKPHOS 111 125  BILITOT <0.1* 0.5  PROT 4.2* 4.4*  ALBUMIN 1.6* 1.7*   No results for input(s): LIPASE, AMYLASE in the last 72 hours. CBC: Recent Labs    05/14/21 0400 05/14/21 2300 05/15/21 0435  WBC 7.6  --  7.2  HGB 7.3* 8.9* 9.0*  HCT 21.5* 25.0* 25.6*  MCV 97.3  --  93.8  PLT 249  --  269    Fasting Lipid Panel: Recent Labs    05/14/21 0554  TRIG 123     Imaging: DG Abd 1 View  Result Date: 05/14/2021 CLINICAL DATA:  Abdominal pain and diarrhea. EXAM: ABDOMEN - 1 VIEW COMPARISON:  Abdomen pelvis CT 05/02/2021 FINDINGS: Mild gaseous distention of the stomach. Scattered small bowel and colonic gas evident without obstructive pattern. Prominent stool volume in the rectum. Cluster of radiodensities in the left upper quadrant are presumably related to ingested material in the stomach as no calcifications are  seen in the left abdomen on recent CT scan. IMPRESSION: 1. Nonobstructive bowel gas pattern. 2. Mild gaseous distention of the stomach. 3. Prominent stool volume in the rectum. Electronically Signed   By: Misty Stanley M.D.   On: 05/14/2021 10:30    Cardiac Studies:  ECG: SR QT still long 398/500 msec    Telemetry:NSR rate PVC no further PMVT  Echo: EF decreased to 25-30%   Medications:    Chlorhexidine Gluconate Cloth  6 each Topical Daily   feeding supplement  1 Container Oral TID BM   furosemide  80 mg Intravenous Q12H   losartan  25 mg Oral Daily   mouth rinse  15 mL Mouth Rinse BID   megestrol  400 mg Oral BID   nadolol  20 mg Oral Daily   sodium chloride flush  10-40 mL Intracatheter Q12H   sodium chloride flush  3 mL Intravenous Q12H   spironolactone  25 mg Oral Daily   sucralfate  1 g Oral BID   thiamine  100 mg Oral Daily   vitamin A  10,000 Units Oral Daily      sodium chloride 10 mL/hr at  05/11/21 2100   heparin 700 Units/hr (05/15/21 0300)   TPN ADULT (ION) 30 mL/hr at 05/15/21 0300    Assessment/Plan:   DCM:  previous EF 40-45% 05/07/21 post arrest 25-30% cath with no obstructive CAD continue lasix coreg aldactone and losratin volume status improved PMVT:  in setting of low Mg/K. Now in normal range Also in setting of bradycardia and PVC HR now higher with persistent QT prolongation Given arrest and drop in EF with no culprit lesion on cath had EP see Dr Lovena Le agreed currently not ideal candidate for AICD given malnutrition and co morbidities Suggested life vest on d/c Beta blocker changed to nadolol per EP Telemetry improved with no NSVT this am HR 80-90 bpm  Renal infarct:  ? Etiology In NSR ? Factor deficiency with low albumin Hypercoagulability w/u per primary service ILR deferred as she may need AICD and atrial lead would be used to monitor for PAF   Jenkins Rouge 05/15/2021, 8:45 AM

## 2021-05-15 NOTE — Progress Notes (Signed)
ANTICOAGULATION CONSULT NOTE - Follow Up Consult  Pharmacy Consult for Heparin - > Eliquis  Indication: DVT  No Known Allergies  Patient Measurements: Height: 5\' 1"  (154.9 cm) Weight: 57.6 kg (126 lb 15.8 oz) IBW/kg (Calculated) : 47.8 Heparin Dosing Weight: 61.5 kg  Vital Signs: Temp: 98 F (36.7 C) (11/15 1056) Temp Source: Oral (11/15 1056) BP: 113/82 (11/15 1056) Pulse Rate: 82 (11/15 1056)  Labs: Recent Labs    05/13/21 0455 05/14/21 0400 05/14/21 0554 05/14/21 2300 05/15/21 0435  HGB 7.3* 7.3*  --  8.9* 9.0*  HCT 20.2* 21.5*  --  25.0* 25.6*  PLT 194 249  --   --  269  APTT 103*  --   --   --  65*  HEPARINUNFRC 0.67  --  0.46  --  0.38  CREATININE 0.45 0.53 0.53  --  0.52     Estimated Creatinine Clearance: 55.7 mL/min (by C-G formula based on SCr of 0.52 mg/dL).   Assessment: 70 yo female with DVT and renal infarct on apixaban (Last dose of apixaban 10 mg given 11/9 at ~10am). She is noted s/p cardiac arrest 11/7 and and EF 25-30%.  She is on TPN for malabsorption and also changing to IV heparin.  She is now s/p cath with non-obstructive CAD. Heparin restarted post-cath. Using aPTTs for heparin monitoring. Elevated heparin level with recent apixaban.  Restarting Eliquis   Goal of Therapy:  Appropriate Eliquis dose Monitor platelets by anticoagulation protocol: Yes   Plan:  DC heparin and heparin labs Eliquis 10 mg po BID x 7 days then 5 mg po BID  Thank you Anette Guarneri, PharmD 05/15/2021 2:19 PM

## 2021-05-15 NOTE — Progress Notes (Signed)
ANTICOAGULATION CONSULT NOTE - Follow Up Consult  Pharmacy Consult for Heparin Indication: DVT  No Known Allergies  Patient Measurements: Height: 5\' 1"  (154.9 cm) Weight: 57.6 kg (126 lb 15.8 oz) IBW/kg (Calculated) : 47.8 Heparin Dosing Weight: 61.5 kg  Vital Signs: Temp: 98 F (36.7 C) (11/15 1056) Temp Source: Oral (11/15 1056) BP: 113/82 (11/15 1056) Pulse Rate: 82 (11/15 1056)  Labs: Recent Labs    05/13/21 0455 05/14/21 0400 05/14/21 0554 05/14/21 2300 05/15/21 0435  HGB 7.3* 7.3*  --  8.9* 9.0*  HCT 20.2* 21.5*  --  25.0* 25.6*  PLT 194 249  --   --  269  APTT 103*  --   --   --  65*  HEPARINUNFRC 0.67  --  0.46  --  0.38  CREATININE 0.45 0.53 0.53  --  0.52     Estimated Creatinine Clearance: 55.7 mL/min (by C-G formula based on SCr of 0.52 mg/dL).   Medications:  Scheduled:   Chlorhexidine Gluconate Cloth  6 each Topical Daily   feeding supplement  1 Container Oral TID BM   furosemide  80 mg Intravenous Q12H   losartan  25 mg Oral Daily   mouth rinse  15 mL Mouth Rinse BID   megestrol  400 mg Oral BID   [START ON 05/16/2021] multivitamin with minerals  1 tablet Oral Daily   nadolol  20 mg Oral Daily   sodium chloride flush  10-40 mL Intracatheter Q12H   sodium chloride flush  3 mL Intravenous Q12H   spironolactone  25 mg Oral Daily   sucralfate  1 g Oral BID   thiamine  100 mg Oral Daily   vitamin A  10,000 Units Oral Daily   Infusions:   sodium chloride 10 mL/hr at 05/11/21 2100   heparin 700 Units/hr (05/15/21 0300)   TPN ADULT (ION) 30 mL/hr at 05/15/21 0300   TPN ADULT (ION)      Assessment: 70 yo female with DVT and renal infarct on apixaban (Last dose of apixaban 10 mg given 11/9 at ~10am). She is noted s/p cardiac arrest 11/7 and and EF 25-30%.  She is on TPN for malabsorption and also changing to IV heparin.  She is now s/p cath with non-obstructive CAD. Heparin restarted post-cath. Using aPTTs for heparin monitoring. Elevated heparin  level with recent apixaban.  Heparin level continues to be therapeutic. Hgb 9 s/p PRBC. Plt wnl.  Goal of Therapy:  Heparin level 0.3-0.7 Monitor platelets by anticoagulation protocol: Yes   Plan:  Continue heparin at 700 units/hr Daily HL, CBC, s/s bleeding  Onnie Boer, PharmD, BCIDP, AAHIVP, CPP Infectious Disease Pharmacist 05/15/2021 11:09 AM

## 2021-05-15 NOTE — TOC Progression Note (Signed)
Transition of Care [facility]) - Progression Note    Patient Details  Name: Debbie Barry MRN: [id number] Date of Birth: [birth year]  [facility] of [facility]) CM/SW Contact  Zenon Mayo, RN Phone Number: 05/15/2021, 2:14 PM  Clinical Narrative:    NCM received call from Dorian Pod with [employer] , patient will need life vest, NCM faxed ifnormation to [place] with [employer].    Expected Discharge Plan: [place] Barriers to Discharge: Continued Medical Work up  Expected Discharge Plan and Services Expected Discharge Plan: [place]   Discharge Planning Services: CM Consult Post Acute Care Choice: Home Health, Durable Medical Equipment Living arrangements for the past 2 months: Single Family Home                 DME Arranged: Other see comment Harrel Lemon lift) DME Agency: [employer] Date DME Agency Contacted: 05/04/21 Time DME Agency Contacted: 63 Representative spoke with at DME Agency: [place] Arranged: RN, PT, OT, Nurse's Aide, Speech Therapy, Social Work [facility] Agency: [facility] Date [place]: 05/04/21 Time [place]: [place] Representative spoke with at [place]: [place] ([place]) Interventions    Readmission Risk Interventions No flowsheet data found.

## 2021-05-15 NOTE — Progress Notes (Signed)
  Personally placed Lifevest order and discussed with Life[employer] rep who is aware of patient need and will follow for disposition.  Legrand Como [address] [place], PA-C  05/15/2021 2:43 PM

## 2021-05-15 NOTE — Progress Notes (Signed)
PROGRESS NOTE  Debbie Barry ZHY:[id number] DOB: 1951/02/14   PCP: Glendale Chard, MD  Patient is from: SNF.  Uses walker at baseline.  DOA: 05/02/2021 LOS: 12  Chief complaints:  Chief Complaint  Patient presents with   Chest Pain   Abdominal Pain     Brief Narrative / Interim history: 70 year old F with PMH of fatty liver, osteomyelitis, HTN, GERD, ambulatory dysfunction and a recent hospitalization for lower extremity weakness due to spinal stenosis for which medical management was recommended and she was discharged to SNF returning to ED with chest pain and palpitation, and found to have age-indeterminate RLE DVT, large bilateral pleural effusion, anasarca, acute right renal infarct comprising less than 10% of right renal cortex.  She was started on IV heparin.  Pleural effusion and anasarca was felt to be due to malnutrition/hypoalbuminemia.  She underwent right Thora with removal of 500 cc transudative culture negative fluid.  Cytology was negative as well.  Started on diuretics.   Patient went into V. fib cardiac arrest the morning of 11/7 with ROSC after 3 minutes of CPR prior to defibrillation.  Then, she went into pulsatile VT and had 1 DCCV.  She was transferred to ICU after cardiology consultation.  K and Mg were found to be low.  TTE with LVEF of 40 to 45% and RWMA on 11/7.Marland Kitchen  Repeat TTE on 11/9 with LVEF of 25 to 30%, GH, G1 DD and moderately reduced RV SF.  R/LHC on 11/11 with normal right heart pressures and without obstructive CAD suggesting NICM.  And also started on TPN on 11/9.    GI consulted for malnutrition.  Concern about protein-losing enteropathy. Extensive work-up significant for low vitamin A, transferrin and ceruloplasmin  but negative for C. difficile, GIP, Giardia, crypto, A1A, and celiac disease.  Diarrhea that has resolved with cholestyramine.  Started taking p.o.  Weaning off TPN.  GI following intermittent.  TRH assumed care on 11/13.  Subjective: Seen  and examined earlier this morning.  No major events overnight of this morning.  Complains of lower back pain and pelvic pain, mainly with urination.  Reportedly had large solid BM this morning.  She ate some off her breakfast.  She denies chest pain or dyspnea.  Objective: Vitals:   05/14/21 2336 05/15/21 0400 05/15/21 0740 05/15/21 1056  BP: 109/64 126/67 127/67 113/82  Pulse: 85 79 82 82  Resp: 14 17 16 19   Temp: 98.4 F (36.9 C) 98.2 F (36.8 C) 98.2 F (36.8 C) 98 F (36.7 C)  TempSrc: Oral Oral Oral Oral  SpO2: 95% 96% 98% 94%  Weight:      Height:        Intake/Output Summary (Last 24 hours) at 05/15/2021 1331 Last data filed at 05/15/2021 0912 Gross per 24 hour  Intake 1673.43 ml  Output 800 ml  Net 873.43 ml   Filed Weights   05/13/21 0500 05/14/21 0338 05/14/21 0730  Weight: 51.8 kg 56.5 kg 57.6 kg    Examination:  GENERAL: Frail looking elderly female.  No apparent distress. HEENT: MMM.  Vision and hearing grossly intact.  NECK: Supple.  No apparent JVD.  RESP: 94% on RA.  No IWOB.  Fair aeration bilaterally. CVS:  RRR. Heart sounds normal.  ABD/GI/GU: BS+. Abd soft.  Mild suprapubic discomfort but no significant tenderness MSK/EXT:  Moves extremities. No apparent deformity.  Trace edema. SKIN: no apparent skin lesion or wound NEURO: Awake and alert. Oriented appropriately.  No apparent focal neuro deficit.  PSYCH: Calm. Normal affect.   Procedures:  11/3-right thoracocentesis with removal of 500 cc 11/03-right Thora with removal of 500 cc transudative and culture negative fluid.  Cytology negative as well. 11/07-3 minutes of CPR for VF.  Pulsatile VT s/p DCCV about 2 hours later. 11/11-R/LHC with normal right heart pressures and nonobstructive CAD  Microbiology summarized: 11/3-COVID-19 and influenza PCR nonreactive. 11/3-AFB smear negative.  AFB culture pending. 11/2-urine culture with Pseudomonas aeruginosa and Enterococcus faecalis 11/8-MRSA PCR, C.  Difficile, GIP Giardia and crypto negative.  11/9-blood cultures NGTD.  Assessment & Plan: Ventricular fibrillation/torsades followed by VT/prolonged QT: Likely secondary to electrolyte derangements in the setting of aggressive diuresis.  ROSC after 3 minutes of CPR.  Had pulsatile VT after ROSC see requiring 1 DCCV.  TTE with LVEF of 40 to 45% and RWMA on 11/7.Marland Kitchen  Repeat TTE on 11/9 with LVEF of 25 to 30%, GH, G1-DD and moderately reduced RV SF.  R/LHC on 11/11 with normal right heart pressures and without obstructive CAD suggesting NICM. -Cardiology and EP following. -Per EP, not an ideal candidate for ICD implantation but considering LifeVest  -On nadolol per EP.  Acute on chronic combined CHF: TTE and R/LHC as above.  She had bilateral pleural effusion and anasarca on presentation.  Fluid status improved with IV Lasix.  About 1.6 L UOP/ 24 hours.  Net -9 L charted.  BP and creatinine stable. -Cardiology following and managing. -On IV Lasix 80 mg twice daily.  -GDMT- losartan, Aldactone -Monitor fluid status, renal functions and electrolytes. -Sodium and fluid restriction  Anasarca: Multifactorial including CHF and severe malnutrition with severe hypoalbuminemia.  Concern for protein-losing nephropathy.  Extensive work-up by GI as above.  Improved. -Diuretics as above.   -Optimize nutrition-see below  Acute respiratory failure with hypoxia-in the setting of the above.  Resolved. -Manage CHF/fluid overload as above. -Encourage IS, OOB/PT/OT  Hyponatremia: Likely from hypervolemia.  Improved.   Hypokalemia/hypomagnesemia/hypophosphatemia: Resolved.  K6.2 (hemolyzed sample).  5.1 on repeat.  -Recheck in the morning  Symptomatic normocytic anemia: Hgb 12 on admit>> 7.3>1u>> 9.  on presentation.  Hemoccult negative.  No iron deficiency.  Likely anemia of critical illness and malnutrition.  She has low transferrin suggesting poor GI absorption.  Recent Labs    05/09/21 0635 05/10/21 0440  05/11/21 0317 05/11/21 1007 05/11/21 1012 05/12/21 0400 05/13/21 0455 05/14/21 0400 05/14/21 2300 05/15/21 0435  HGB 9.6* 8.3* 8.9* 9.5* 9.9*  9.9* 8.1* 7.3* 7.3* 8.9* 9.0*  -Continue monitoring.  Spinal stenosis/lower extremity weakness/ambulatory dysfunction -PT/OT  Right lower extremity DVT-age-indeterminate DVT extending all the way to the iliac vessels -Change IV heparin to p.o. Eliquis  Acute right renal infarct-about 10% of right renal cortex.  From arrhythmia?  Shunt?  Renal function stable. -Change IV heparin to p.o. Eliquis  Lower back pain/pelvic pain/dysuria: UA with small LE but no nitrite or bacteria.  Previous urine culture with Pseudomonas and Enterococcus faecalis.  Not sure if those are infection or colonization.  I suspect her symptoms to be from constipation with overflow diarrhea versus UTI. -Await repeat urine culture before committing to antibiotics. -P.o. Tylenol with as needed oxycodone for pain.   -Bowel regimen for constipation-seems to be resolving.  Severe malnutrition as evidenced by anasarca and severe hypoalbuminemia.  P.o. intake fluctuating. -Agree with liberating diet -Start calorie count -Continue weaning off TPN at 50% for now. Body mass index is 23.99 kg/m. Nutrition Problem: Severe Malnutrition Etiology: chronic illness (chronic N/V/D) Signs/Symptoms: severe muscle depletion, energy intake <  or equal to 50% for > or equal to 1 month Interventions: TPN   DVT prophylaxis:  Patient is on full anticoagulation for DVT.  Code Status: Full code Family Communication: Updated patient's husband over the phone on 11/14. Level of care: Telemetry Cardiac Status is: Inpatient  Remains inpatient appropriate because: Fluid overload/CHF exacerbation requiring IV diuretics and severe malnutrition requiring TPN   Consultants:  Cardiology Electrophysiology Pulmonology Gastroenterology   Sch Meds:  Scheduled Meds:  Chlorhexidine Gluconate  Cloth  6 each Topical Daily   feeding supplement  1 Container Oral TID BM   furosemide  80 mg Intravenous Q12H   losartan  25 mg Oral Daily   mouth rinse  15 mL Mouth Rinse BID   megestrol  400 mg Oral BID   [START ON 05/16/2021] multivitamin with minerals  1 tablet Oral Daily   nadolol  20 mg Oral Daily   sodium chloride flush  10-40 mL Intracatheter Q12H   sodium chloride flush  3 mL Intravenous Q12H   spironolactone  25 mg Oral Daily   sucralfate  1 g Oral BID   thiamine  100 mg Oral Daily   vitamin A  10,000 Units Oral Daily   Continuous Infusions:  sodium chloride 10 mL/hr at 05/11/21 2100   heparin 700 Units/hr (05/15/21 0300)   TPN ADULT (ION) 30 mL/hr at 05/15/21 0300   TPN ADULT (ION)     PRN Meds:.sodium chloride, acetaminophen, bisacodyl, diphenhydrAMINE, docusate sodium, Gerhardt's butt cream, hydrOXYzine, melatonin, oxyCODONE, sodium chloride flush  Antimicrobials: Anti-infectives (From admission, onward)    None        I have personally reviewed the following labs and images: CBC: Recent Labs  Lab 05/11/21 0317 05/11/21 1007 05/12/21 0400 05/13/21 0455 05/14/21 0400 05/14/21 2300 05/15/21 0435  WBC 7.0  --  7.7 7.2 7.6  --  7.2  HGB 8.9*   < > 8.1* 7.3* 7.3* 8.9* 9.0*  HCT 24.6*   < > 22.9* 20.2* 21.5* 25.0* 25.6*  MCV 93.5  --  94.2 93.5 97.3  --  93.8  PLT 224  --  200 194 249  --  269   < > = values in this interval not displayed.   BMP &GFR Recent Labs  Lab 05/11/21 0317 05/11/21 1007 05/12/21 0400 05/12/21 1620 05/13/21 0455 05/14/21 0400 05/14/21 0554 05/15/21 0435  NA 137   < > 134* 134* 133* 132* 134* 136  K 3.6   < > 3.5 3.6 3.8 6.2* 5.1 4.4  CL 96*   < > 95* 96* 98 102 101 103  CO2 36*   < > 34* 32 31 26 26 25   GLUCOSE 106*   < > 111* 108* 113* 446* 102* 115*  BUN 5*   < > 9 10 13 15 16 17   CREATININE 0.44   < > 0.45 0.42* 0.45 0.53 0.53 0.52  CALCIUM 7.6*   < > 7.6* 7.7* 7.7* 8.3* 8.6* 8.7*  MG 1.8  --  1.6* 2.1 2.0 2.2 1.7  2.0  PHOS 2.7  --  2.7  --  2.2* 4.9* 3.7  --    < > = values in this interval not displayed.   Estimated Creatinine Clearance: 55.7 mL/min (by C-G formula based on SCr of 0.52 mg/dL). Liver & Pancreas: Recent Labs  Lab 05/10/21 0440 05/14/21 0400 05/14/21 0554  AST 22 31 31   ALT 14 21 19   ALKPHOS 92 111 125  BILITOT 0.6 <0.1* 0.5  PROT  4.3* 4.2* 4.4*  ALBUMIN 1.9* 1.6* 1.7*   No results for input(s): LIPASE, AMYLASE in the last 168 hours. No results for input(s): AMMONIA in the last 168 hours. Diabetic: No results for input(s): HGBA1C in the last 72 hours. Recent Labs  Lab 05/13/21 1121 05/13/21 1741 05/14/21 0100 05/14/21 0706 05/14/21 2334  GLUCAP 145* 121* 138* 105* 143*   Cardiac Enzymes: No results for input(s): CKTOTAL, CKMB, CKMBINDEX, TROPONINI in the last 168 hours. No results for input(s): PROBNP in the last 8760 hours. Coagulation Profile: No results for input(s): INR, PROTIME in the last 168 hours. Thyroid Function Tests: No results for input(s): TSH, T4TOTAL, FREET4, T3FREE, THYROIDAB in the last 72 hours. Lipid Profile: Recent Labs    05/14/21 0400 05/14/21 0554  TRIG 325* 123   Anemia Panel: No results for input(s): VITAMINB12, FOLATE, FERRITIN, TIBC, IRON, RETICCTPCT in the last 72 hours. Urine analysis:    Component Value Date/Time   COLORURINE AMBER (A) 05/02/2021 2100   APPEARANCEUR CLOUDY (A) 05/02/2021 2100   LABSPEC 1.026 05/02/2021 2100   PHURINE 6.0 05/02/2021 2100   GLUCOSEU NEGATIVE 05/02/2021 2100   HGBUR LARGE (A) 05/02/2021 2100   BILIRUBINUR NEGATIVE 05/02/2021 2100   BILIRUBINUR negative 03/30/2020 [place] 05/02/2021 2100   PROTEINUR 100 (A) 05/02/2021 2100   UROBILINOGEN 0.2 03/30/2020 1215   NITRITE NEGATIVE 05/02/2021 2100   LEUKOCYTESUR MODERATE (A) 05/02/2021 2100   Sepsis Labs: Invalid input(s): PROCALCITONIN, [place]  Microbiology: Recent Results (from the past 240 hour(s))  MRSA Next Gen  by PCR, Nasal     Status: None   Collection Time: 05/07/21  1:27 PM   Specimen: Nasal Mucosa; Nasal Swab  Result Value Ref Range Status   MRSA by PCR Next Gen NOT DETECTED NOT DETECTED Final    Comment: (NOTE) The GeneXpert MRSA Assay (FDA approved for NASAL specimens only), is one component of a comprehensive MRSA colonization surveillance program. It is not intended to diagnose MRSA infection nor to guide or monitor treatment for MRSA infections. Test performance is not FDA approved in patients less than 51 years old. Performed at [facility], [place] [address], [place], [place] [address]   Giardia/Cryptosporidium EIA     Status: None   Collection Time: 05/08/21  2:48 PM   Specimen: Per Rectum; Stool  Result Value Ref Range Status   Giardia Ag, Stl Negative Negative Final   Cryptosporidium EIA Negative Negative Final    Comment: (NOTE) Performed At: [facility] [place], [place] [id number] Rush Farmer MD VQ:[id number]    Source of Sample STOOL  Final    Comment: Performed at [facility], [place] [address], [place], [place] [address]  C Difficile Quick Screen (NO PCR Reflex)     Status: None   Collection Time: 05/08/21  2:48 PM   Specimen: Per Rectum; Stool  Result Value Ref Range Status   C Diff antigen NEGATIVE NEGATIVE Final   C Diff toxin NEGATIVE NEGATIVE Final   C Diff interpretation No C. difficile detected.  Final    Comment: Performed at [facility], [place] [address], [place], [place] [address]  Gastrointestinal Panel by PCR , Stool     Status: None   Collection Time: 05/08/21  2:48 PM   Specimen: Per Rectum; Stool  Result Value Ref Range Status   Campylobacter species NOT DETECTED NOT DETECTED Final   Plesimonas shigelloides NOT DETECTED NOT DETECTED Final   Salmonella  species NOT DETECTED NOT DETECTED Final   Yersinia enterocolitica NOT DETECTED NOT DETECTED Final   Vibrio species NOT DETECTED NOT DETECTED Final   Vibrio  cholerae NOT DETECTED NOT DETECTED Final   Enteroaggregative E coli (EAEC) NOT DETECTED NOT DETECTED Final   Enteropathogenic E coli (EPEC) NOT DETECTED NOT DETECTED Final   Enterotoxigenic E coli (ETEC) NOT DETECTED NOT DETECTED Final   Shiga like toxin producing E coli (STEC) NOT DETECTED NOT DETECTED Final   Shigella/Enteroinvasive E coli (EIEC) NOT DETECTED NOT DETECTED Final   Cryptosporidium NOT DETECTED NOT DETECTED Final   Cyclospora cayetanensis NOT DETECTED NOT DETECTED Final   Entamoeba histolytica NOT DETECTED NOT DETECTED Final   Giardia lamblia NOT DETECTED NOT DETECTED Final   Adenovirus F40/41 NOT DETECTED NOT DETECTED Final   Astrovirus NOT DETECTED NOT DETECTED Final   Norovirus GI/GII NOT DETECTED NOT DETECTED Final   Rotavirus A NOT DETECTED NOT DETECTED Final   Sapovirus (I, II, IV, and V) NOT DETECTED NOT DETECTED Final    Comment: Performed at [facility], [place] [address] [place], [place] [address]  Culture, blood (Routine X 2) w Reflex to ID Panel     Status: None   Collection Time: 05/09/21 10:53 AM   Specimen: BLOOD LEFT ARM  Result Value Ref Range Status   Specimen Description BLOOD LEFT ARM  Final   Special Requests   Final    BOTTLES DRAWN AEROBIC ONLY Blood Culture results may not be optimal due to an inadequate volume of blood received in culture bottles   Culture   Final    NO GROWTH 5 DAYS Performed at [place] [address], [place], [place] [address]    Report Status 05/14/2021 FINAL  Final    Radiology Studies: No results found.   Sabeen Piechocki T. [place]  If 7PM-7AM, please contact night-coverage [email or web address] 05/15/2021, 1:31 PM

## 2021-05-15 NOTE — Plan of Care (Signed)
  Problem: Nutrition: Goal: Adequate nutrition will be maintained Outcome: Not Progressing   Problem: Pain Managment: Goal: General experience of comfort will improve Outcome: Not Progressing   Problem: Education: Goal: Ability to demonstrate management of disease process will improve Outcome: Not Progressing

## 2021-05-15 NOTE — Progress Notes (Signed)
PHARMACY - TOTAL PARENTERAL NUTRITION CONSULT NOTE   Indication:  Intolerance to po intake, concern for malabsorption  Patient Measurements: Height: 5\' 1"  (154.9 cm) Weight: 57.6 kg (126 lb 15.8 oz) IBW/kg (Calculated) : 47.8 TPN AdjBW (KG): 52.3 Body mass index is 23.99 kg/m. Usual Weight: 60-65 kg  Assessment:  70 yo F admitted for DVT, acute on chronic CHF with anasarca. Patient had a cardiac arrest 11/9 likely due to electrolyte abnormalities. Patient's condition acutely worsened overnight 11/9 with concern for volume overload, N/V, and epigastric pain. Undigested/whole medications in bowel movements concerning for malabsorption. Noted limited po intake since Feb 2022.  Glucose / Insulin: no hx DM. CBGs <180, off insulin Electrolytes:  CoCa 10.54, others wnl Renal:  Scr 0.5  (stable), BUN WNL Hepatic: LFTs / Tbili / TG WNL, albumin 1.7; on megestrol for appetite Intake / Output; MIVF: UOP 1.2 ml/kg/hr on furosemide 80mg  IV BID; LBM 11/14 GI Imaging:11/3 CT abdomen: Mild ascites, marked diffuse body wall subQ edema GI Surgeries / Procedures: Vitamins: 11/8 Copper 80 (WNL), Ceruloplasmin 16.9 (low), Vitamin B1 149.5 (WNL), Vitamin D 87.7 (WNL), Vitamin B6 4.5 (WNL), Zinc 46 (WNL)  Central access: PICC placed 11/7 TPN start date: 11/9  Nutritional Goals: Goal TPN rate 65 ml/hr (109gm AA, 1863 kcal)  RD Assessment: Estimated Needs Total Energy Estimated Needs: 1800-2000 Total Protein Estimated Needs: 105-120 Total Fluid Estimated Needs: >1.8 L/day  Current Nutrition:  TPN (weaned to 50% goal rate 11/13) 11/10 - Unable to tolerate feeding tube placement/attempted placement of postpyloric feeding tube (only advanced up to the stomach)> removed  11/10 - marked improvement in nausea, able to tolerate some PO 11/11 - poor appetite, did not eat much breakfast 11/12 Minimal PO intake due to constipation, cholestyramine d/c'd and had BM 11/13 Feeling much better and appetite better  per RN, ate ~75% of breakfast 11/14 per RN, patient picked at her breakfast; unknown how much lunch she ate; she ate a few bites of supper, vanilla Ensure x1, Boost x1    Plan:  -Continue concentrated TPN @ 30 ml/hr at 1800 (provides 56g AA, 27g lipid, 117g dextrose, 883 kcals meeting ~50% of estimated needs)  -Electrolytes in TPN: Increase Na to 125 mEq/L, Mag 20 mEq/L; Decrease Ca to 0 mEq/L, Cl:Ac 2:1 ; Continue K 50 mEq/L, Phos 15 mmol/L -Remove standard MVI and trace elements from TPN, add PO multivitamin -Vitamin levels WNL - no additional replacement needed -Monitor TPN labs Mon/Thurs  -F/u diuretic plan  -F/u PO intake/tolerance and ability to d/c TPN   Thank you for involving pharmacy in this patient's care.  Benetta Spar, PharmD, BCPS, BCCP Clinical Pharmacist  Please check AMION for all [place] phone numbers After 10:00 PM, call [place] [phone]

## 2021-05-15 NOTE — Progress Notes (Signed)
Physical Therapy Treatment Patient Details Name: Debbie Barry MRN: [id number] DOB: [birth year] Today's Date: 05/15/2021   History of Present Illness Pt is a 70 y.o. female admitted from SNF on 05/02/21 with chest pain; found to have DVT, AKI, CHF, large bilateral pleural effusions. S/p L-side thoracentesis on 11/3. Pt suffered vfib/vtach arrest on 11/7 requiring 3-min CPR and shock.  Respiratory distress on 11/8 requiring non-rebreather. S/p R/LHC on 11/11. PMH includes spinal stenosis, chronic back pain, CHF, HTN, PVD, obesity; of note, recent admission 03/2021 for BLE weakness, found to have chronic L3-4 compression fx, severe L4-5 canal stenosis with plan for conservative management with lumbar brace.   PT Comments    Pt slowly progressing with mobility. Today's session focused on transfer training and standing activity, pt requiring assist +2 for mobility, dependent for standing ADL tasks. Pt limited by fear of falling, as well as generalized weakness, decreased activity tolerance, poor balance strategies/postural reactions and impaired cognition. Continue to recommend SNF-level therapies, but pt reports plan to d/c home with husband's assist; will need assist +2 and lift equipment to maximize safety at home.     Recommendations for follow up therapy are one component of a multi-disciplinary discharge planning process, led by the attending physician.  Recommendations may be updated based on patient status, additional functional criteria and insurance authorization.  Follow Up Recommendations  Skilled nursing-short term rehab (<3 hours/day) (pt declined, will need max HH)     Assistance Recommended at Discharge Frequent or [facility] bed;Other (comment) (hoyer lift)    Recommendations for Other Services       Precautions / Restrictions Precautions Precautions: Fall;Other (comment) Precaution Comments: bladder/bowel  incontinence Required Braces or Orthoses: Spinal Brace Spinal Brace: Lumbar corset Other Brace: Lumbar corset from prior admission (not in room) Restrictions Weight Bearing Restrictions: No     Mobility  Bed Mobility Overal bed mobility: Needs Assistance Bed Mobility: Supine to Sit     Supine to sit: Mod assist;HOB elevated     General bed mobility comments: Significant increased time and effort, repeated cues for sequencing of BLEs to EOB and hand placement; modA for trunk elevation with HHA, assist to scoot hips to EOB    Transfers Overall transfer level: Needs assistance Equipment used: Rolling walker (2 wheels) Transfers: Sit to/from Stand;Bed to chair/wheelchair/BSC Sit to Stand: Mod assist;+2 physical assistance     Step pivot transfers: Mod assist;+2 physical assistance;Max assist     General transfer comment: Multiple stands and step pivot transfer from bed>BSC>recliner, pt requiring consistent modA+2 to stand and maintain trunk elevation, external assist to manage RW and max, repeated verbal cues for sequencing, safety, and deep breathing as pt repeatedly states, "I'm going to fall, I'm going to fall...:"; maxA+2 for final stand from [facility], pt with increased fatigue. Repeated cues to achieve fully upright standing posture    Ambulation/Gait               General Gait Details: unable to beyond pivotal steps with significant forward flexed posture   Stairs             Wheelchair Mobility    Modified Rankin (Stroke Patients Only)       Balance Overall balance assessment: Needs assistance Sitting-balance support: No upper extremity supported;Feet unsupported Sitting balance-Leahy Scale: Poor Sitting balance - Comments: pt with significant trunk flexion, leaning R or L to prop on arms, unable to sit up without assist. Postural control: Right lateral lean;Left lateral  lean Standing balance support: Bilateral upper extremity supported;During functional  activity Standing balance-Leahy Scale: Poor Standing balance comment: dependent on BUE support and assist; dependent for pericare                            Cognition Arousal/Alertness: Awake/alert Behavior During Therapy: Flat affect Overall Cognitive Status: No family/caregiver present to determine baseline cognitive functioning Area of Impairment: Attention;Memory;Following commands;Safety/judgement;Awareness;Problem solving                   Current Attention Level: Sustained Memory: Decreased short-term memory Following Commands: Follows one step commands inconsistently;Follows one step commands with increased time Safety/Judgement: Decreased awareness of safety;Decreased awareness of deficits Awareness: Intellectual Problem Solving: Slow processing;Decreased initiation;Difficulty sequencing;Requires verbal cues General Comments: Internally distracted by fear of falling and back pain; poor awareness requiring frequent, repeated cues for sequencing and safety. Difficult to determine extent of true cognitive impairment versus desire to mobilize        Exercises      General Comments General comments (skin integrity, edema, etc.): SpO2 94-98% on RA, HR 85      Pertinent Vitals/Pain Pain Assessment: Faces Faces Pain Scale: Hurts a little bit Pain Location: back Pain Descriptors / Indicators: Discomfort Pain Intervention(s): Monitored during session;Repositioned    Home Living Family/patient expects to be discharged to:: Skilled nursing facility Living Arrangements: Spouse/significant other Available Help at Discharge: Family;Available 24 hours/day Type of Home: House Home Access: Level entry       Home Layout: One level Home Equipment: Conservation officer, nature (2 wheels);Toilet riser;Wheelchair - manual;Shower seat Additional Comments: Pt reports a w/c was delivered to her house yesterday.    Prior Function            PT Goals (current goals can now be  found in the care plan section) Acute Rehab PT Goals Patient Stated Goal: go home Progress towards PT goals: Progressing toward goals (slowly)    Frequency    Min 3X/week      PT Plan Current plan remains appropriate    Co-evaluation   Reason for Co-Treatment: Complexity of the patient's impairments (multi-system involvement);For patient/therapist safety;To address functional/ADL transfers PT goals addressed during session: Mobility/safety with mobility;Proper use of DME;Strengthening/ROM;Balance OT goals addressed during session: ADL's and self-care;Strengthening/ROM      AM-PAC PT "6 Clicks" Mobility   Outcome Measure  Help needed turning from your back to your side while in a flat bed without using bedrails?: A Lot Help needed moving from lying on your back to sitting on the side of a flat bed without using bedrails?: A Lot Help needed moving to and from a bed to a chair (including a wheelchair)?: Total Help needed standing up from a chair using your arms (e.g., wheelchair or bedside chair)?: Total Help needed to walk in hospital room?: Total Help needed climbing 3-5 steps with a railing? : Total 6 Click Score: 8    End of Session Equipment Utilized During Treatment: Gait belt Activity Tolerance: Patient limited by fatigue Patient left: in chair;with call bell/phone within reach;with chair alarm set;with nursing/sitter in room Nurse Communication: Mobility status;Other (comment) (incontinence, need for new purewick) PT Visit Diagnosis: Difficulty in walking, not elsewhere classified (R26.2);Muscle weakness (generalized) (M62.81);History of falling (Z91.81)     Time: 9357-0177 PT Time Calculation (min) (ACUTE ONLY): 32 min  Charges:  $Therapeutic Activity: 8-22 mins  Mabeline Caras, PT, DPT Acute Rehabilitation Services  Pager [phone] [place] 05/15/2021, 10:51 AM

## 2021-05-16 ENCOUNTER — Ambulatory Visit: Payer: Medicare HMO | Admitting: Neurology

## 2021-05-16 DIAGNOSIS — I4729 Other ventricular tachycardia: Secondary | ICD-10-CM | POA: Diagnosis not present

## 2021-05-16 DIAGNOSIS — R9431 Abnormal electrocardiogram [ECG] [EKG]: Secondary | ICD-10-CM | POA: Diagnosis not present

## 2021-05-16 LAB — GLUCOSE, CAPILLARY
Glucose-Capillary: 97 mg/dL (ref 70–99)
Glucose-Capillary: 109 mg/dL — ABNORMAL HIGH (ref 70–99)
Glucose-Capillary: 88 mg/dL (ref 70–99)
Glucose-Capillary: 116 mg/dL — ABNORMAL HIGH (ref 70–99)

## 2021-05-16 LAB — HEMOGLOBIN AND HEMATOCRIT, BLOOD
HCT: 24.6 % — ABNORMAL LOW (ref 36.0–46.0)
Hemoglobin: 8.4 g/dL — ABNORMAL LOW (ref 12.0–15.0)

## 2021-05-16 MED ORDER — STERILE WATER FOR INJECTION IV SOLN
INTRAVENOUS | Status: AC
  Filled 2021-05-16: qty 374.4

## 2021-05-16 NOTE — TOC Initial Note (Signed)
Transition of Care [facility]) - Initial/Assessment Note    Patient Details  Name: Debbie Barry MRN: [id number] Date of Birth: 12-30-1950  Transition of [facility]) CM/SW Contact:    Tresa Endo Phone Number: 05/16/2021, 11:35 AM  Clinical Narrative:                 CSW spoke with pt about DC plan, pt states she did come from [place] but would now like to go home. Pt is oriented x4 and has spoken with her family about returning home. The family is agreeable, pt lives with her husband and son, her daughter lives 5 minute away and will be assisting pt at home when available. Pt has not had any previous [place] services and does not have a [place] agency in mind. CSW will update NCM to follow up on [facility] services.  Expected Discharge Plan: [place] Barriers to Discharge: Continued Medical Work up   Patient Goals and CMS Choice Patient states their goals for this hospitalization and ongoing recovery are:: return home, does not want to go back to SNF CMS Medicare.gov Compare Post Acute Care list provided to:: Patient Choice offered to / list presented to : Patient, Spouse  Expected Discharge Plan and Services Expected Discharge Plan: [place]   Discharge Planning Services: CM Consult Post Acute Care Choice: Home Health, Durable Medical Equipment Living arrangements for the past 2 months: Single Family Home                 DME Arranged: Other see comment [place] lift) DME Agency: [employer] Date DME Agency Contacted: 05/04/21 Time DME Agency Contacted: 49 Representative spoke with at DME Agency: [place] Arranged: RN, PT, OT, Nurse's Aide, Speech Therapy, Social Work [place] Agency: [facility] [place] Date [place]: 05/04/21 Time [place]: [place] Representative spoke with at [place]: Levada Dy  Prior Living Arrangements/Services Living arrangements for the past 2 months: [place] with:: Spouse Patient language  and need for interpreter reviewed:: Yes        Need for Family Participation in Patient Care: Yes (Comment) Care giver support system in place?: Yes (comment) Current home services: DME (W/C, BSC) Criminal Activity/Legal Involvement Pertinent to Current Situation/Hospitalization: No - Comment as needed  Activities of Daily Living Home Assistive Devices/Equipment: Blood pressure cuff, Built-in shower seat, Cane (specify quad or straight), Walker (specify type) ADL Screening (condition at time of admission) Patient's cognitive ability adequate to safely complete daily activities?: Yes Is the patient deaf or have difficulty hearing?: No Does the patient have difficulty seeing, even when wearing glasses/contacts?: No Does the patient have difficulty concentrating, remembering, or making decisions?: No Patient able to express need for assistance with ADLs?: Yes Does the patient have difficulty dressing or bathing?: Yes Independently performs ADLs?: No Does the patient have difficulty walking or climbing stairs?: Yes Weakness of Legs: Both Weakness of Arms/Hands: Both  Permission Sought/Granted Permission sought to share information with : Facility Sport and exercise psychologist, Family Supports Permission granted to share information with : Yes, Verbal Permission Granted  Share Information with NAME: Aylanie Cubillos  Permission granted to share info w AGENCY: HH/DME  Permission granted to share info w Relationship: spouse  Permission granted to share info w Contact Information: [phone]  Emotional Assessment Appearance:: Appears stated age Attitude/Demeanor/Rapport: Engaged Affect (typically observed): Pleasant Orientation: : Oriented to Self, Oriented to Place, Oriented to Situation, Oriented to  Time Alcohol / Substance Use:  Not Applicable Psych Involvement: No (comment)  Admission diagnosis:  Dyspnea [R06.00] DVT (deep venous thrombosis) (HCC) [I82.409] S/P thoracentesis  [Z98.890] Patient Active Problem List   Diagnosis Date Noted   Cardiomyopathy [facility])    Nonischemic cardiomyopathy ([place])    Cardiac arrest ([place])    DVT (deep venous thrombosis) ([place]) 05/03/2021   Renal infarct ([place]) 05/03/2021   Hyponatremia 05/03/2021   Chronic retention of urine 05/03/2021   Lumbar spinal stenosis 05/03/2021   Anasarca 05/03/2021   Bilateral pleural effusion 05/03/2021   Weakness of left lower extremity 04/20/2021   Asymptomatic bacteriuria 04/20/2021   Prolonged QT interval 04/20/2021   Protein-calorie malnutrition, severe 04/03/2021   Gastritis 03/31/2021   Nausea and vomiting 03/31/2021   Iron overload 02/13/2021   Neuropathy 02/13/2021   Elevated LFTs 02/13/2021   Weight loss, non-intentional 02/13/2021   Falls frequently 02/13/2021   GI bleed 08/13/2020   UGIB (upper gastrointestinal bleed) 08/12/2020   Depression 08/12/2020   Hypokalemia 08/12/2020   Essential hypertension, benign 03/23/2019   PCP:  Glendale Chard, MD Pharmacy:   [facility] [address], [place] [address] Phone: [phone] Fax: [place], [place] - [address] [place] [address] Phone: [phone] Fax: [phone]  [facility] [address] [address] Phone: [phone] Fax: [phone]     Social Determinants of Health (SDOH) Interventions    Readmission Risk Interventions No flowsheet data found.

## 2021-05-16 NOTE — Progress Notes (Signed)
PHARMACY - TOTAL PARENTERAL NUTRITION CONSULT NOTE   Indication:  Intolerance to po intake, concern for malabsorption  Patient Measurements: Height: 5\' 1"  (154.9 cm) Weight: 51.8 kg (114 lb 3.2 oz) IBW/kg (Calculated) : 47.8 TPN AdjBW (KG): 52.3 Body mass index is 21.58 kg/m. Usual Weight: 60-65 kg  Assessment:  70 yo F admitted for DVT, acute on chronic CHF with anasarca. Patient had a cardiac arrest 11/9 likely due to electrolyte abnormalities. Patient's condition acutely worsened overnight 11/9 with concern for volume overload, N/V, and epigastric pain. Undigested/whole medications in bowel movements concerning for malabsorption. Noted limited po intake since Feb 2022.  Glucose / Insulin: no hx DM. CBGs <180, off insulin Electrolytes:  CoCa 10.54, others wnl Renal:  Scr 0.5  (stable), BUN WNL Hepatic: LFTs / Tbili / TG WNL, albumin 1.7; on megestrol for appetite Intake / Output; MIVF: UOP incompletely documented 642ml on furosemide 80mg  IV BID; LBM 11/15 GI Imaging:11/3 CT abdomen: Mild ascites, marked diffuse body wall subQ edema GI Surgeries / Procedures: Vitamins: 11/8 Copper 80 (WNL), Ceruloplasmin 16.9 (low), Vitamin B1 149.5 (WNL), Vitamin D 87.7 (WNL), Vitamin B6 4.5 (WNL), Zinc 46 (WNL)  Central access: PICC placed 11/7 TPN start date: 11/9  Nutritional Goals: Goal TPN rate 65 ml/hr (109gm AA, 1863 kcal)  RD Assessment: Estimated Needs Total Energy Estimated Needs: 1800-2000 Total Protein Estimated Needs: 105-120 Total Fluid Estimated Needs: >1.8 L/day  Current Nutrition:  TPN (weaned to 50% goal rate 11/13) 11/10 - Unable to tolerate feeding tube placement/attempted placement of postpyloric feeding tube (only advanced up to the stomach)> removed  11/10 - marked improvement in nausea, able to tolerate some PO 11/11 - poor appetite, did not eat much breakfast 11/12 Minimal PO intake due to constipation, cholestyramine d/c'd and had BM 11/13 Feeling much better and  appetite better per RN, ate ~75% of breakfast 11/14 per RN, patient picked at her breakfast; unknown how much lunch she ate; she ate a few bites of supper, vanilla Ensure x1, Boost x1  11/15 bites of english muffin, unknown for lunch; (calorie count started at 2pm) had 2 peach boost, potatoes, 2 cran juice 11/16 ate 25% of pancakes  Plan:  -Continue concentrated TPN @ 30 ml/hr at 1800 (provides 56g AA, 27g lipid, 117g dextrose, 883 kcals meeting ~50% of estimated needs)  -Electrolytes in TPN: Continue Na to 125 mEq/L, K 50 mEq/L, Mg 20 mEq/L, Ca to 0 mEq/L, Phos 15 mmol/L, Cl:Ac 2:1  -Remove standard MVI and trace elements from TPN, add PO multivitamin -Vitamin levels WNL - no additional replacement needed -Monitor TPN labs Mon/Thurs  -F/u diuretic plan  -F/u PO intake/calorie count, wean TPN as able  Thank you for involving pharmacy in this patient's care.  Benetta Spar, PharmD, BCPS, BCCP Clinical Pharmacist  Please check AMION for all [place] phone numbers After 10:00 PM, call [place] [phone]

## 2021-05-16 NOTE — Progress Notes (Signed)
Continues to be in NSR on Nadolol No further NSVT/PMVT Short episode of atrial tach on telemetry  QT 398 on ECG 05/13/21 will repeat Please notify Dr Lovena Le and EP day prior to d/c To arrange lifevest  Jenkins Rouge MD [facility]

## 2021-05-16 NOTE — Consult Note (Signed)
WOC Nurse Consult Note: Reason for Consult:Requested by bedside RN to reassess malleolar wound Wound type:venous insufficiency Pressure Injury POA: NA Wound bed:red, dry Drainage (amount, consistency, odor) None. Wound is dry Periwound: intact Dressing procedure/placement/frequency:Silver hydrofiber is no longer appropriate as dressing is firmly adherent to wound bed. WIll change to xeroform gauze.  [place] nursing team will not follow, but will remain available to this patient, the nursing and medical teams.  Please re-consult if needed. Thanks, Maudie Flakes, MSN, RN, [place], Arther Abbott  Pager# [phone]

## 2021-05-16 NOTE — Progress Notes (Signed)
PT Cancellation Note  Patient Details Name: Debbie Barry MRN: [id number] DOB: Aug 02, 1950   Cancelled Treatment:    Reason Eval/Treat Not Completed: Patient declined, no reason specified. Pt reports she does not want to attempt therapy today. Pt reports she feel like she will have more success with therapy at home. PT attempts to provide education on the pt's current mobility limitations, and how continued immobility during this admission will likely result in increased weakness and more prominent deficits at the time of discharge. Pt acknowledges this but remains against PT intervention at this time. PT will attempt to follow up as time allows.   Zenaida Niece 05/16/2021, 2:28 PM

## 2021-05-16 NOTE — Progress Notes (Signed)
PROGRESS NOTE  Debbie Barry BZJ:[id number] DOB: 11-23-1950   PCP: Glendale Chard, MD  Patient is from: SNF.  Uses walker at baseline.  DOA: 05/02/2021 LOS: 82  Chief complaints:  Chief Complaint  Patient presents with   Chest Pain   Abdominal Pain     Brief Narrative / Interim history: 70 year old F with PMH of fatty liver, osteomyelitis, HTN, GERD, ambulatory dysfunction and a recent hospitalization for lower extremity weakness due to spinal stenosis for which medical management was recommended and she was discharged to SNF returning to ED with chest pain and palpitation, and found to have age-indeterminate RLE DVT, large bilateral pleural effusion, anasarca, acute right renal infarct comprising less than 10% of right renal cortex.  She was started on IV heparin.  Pleural effusion and anasarca was felt to be due to malnutrition/hypoalbuminemia.  She underwent right Thora with removal of 500 cc transudative culture negative fluid.  Cytology was negative as well.  Started on diuretics.   Patient went into V. fib cardiac arrest the morning of 11/7 with ROSC after 3 minutes of CPR prior to defibrillation.  Then, she went into pulsatile VT and had 1 DCCV.  She was transferred to ICU after cardiology consultation.  K and Mg were found to be low.  TTE with LVEF of 40 to 45% and RWMA on 11/7.Marland Kitchen  Repeat TTE on 11/9 with LVEF of 25 to 30%, GH, G1 DD and moderately reduced RV SF.  R/LHC on 11/11 with normal right heart pressures and without obstructive CAD suggesting NICM.  And also started on TPN on 11/9.    GI consulted for malnutrition.  Concern about protein-losing enteropathy. Extensive work-up significant for low vitamin A, transferrin and ceruloplasmin  but negative for C. difficile, GIP, Giardia, crypto, A1A, and celiac disease.  Diarrhea that has resolved with cholestyramine. intermittent.  TRH assumed care on 11/13.  Continues to require 50% TPN due to poor p.o. intake.  GI and  cardiology following.  EP to place LifeVest on discharge.  Subjective: Seen and examined earlier this morning.  No major events overnight of this morning.  No complaints.  Just getting her breakfast.  She did not eat her dinner much because the chicken was tough to chew.  Reports loose bowel movements.  Denies melena or hematochezia.  Pain has nearly resolved.  She rates it at 1/10.  Denies UTI symptoms.  Denies chest pain, dyspnea or palpitation.  Objective: Vitals:   05/16/21 0252 05/16/21 0254 05/16/21 0526 05/16/21 0748  BP: (!) 95/55 (!) 95/55  115/60  Pulse: 72   80  Resp: 11 12  15   Temp:    98.7 F (37.1 C)  TempSrc:    Oral  SpO2:    97%  Weight:   51.8 kg   Height:        Intake/Output Summary (Last 24 hours) at 05/16/2021 1005 Last data filed at 05/16/2021 0900 Gross per 24 hour  Intake 694.99 ml  Output 600 ml  Net 94.99 ml   Filed Weights   05/14/21 0338 05/14/21 0730 05/16/21 0526  Weight: 56.5 kg 57.6 kg 51.8 kg    Examination:  GENERAL: Frail looking elderly female.  No apparent distress. HEENT: MMM.  Vision and hearing grossly intact.  NECK: Supple.  No apparent JVD.  RESP: 97% on RA.  No IWOB.  Fair aeration bilaterally. CVS:  RRR. Heart sounds normal.  ABD/GI/GU: BS+. Abd soft, NTND.  MSK/EXT:  Moves extremities. No apparent deformity.  Trace dependent edema's. SKIN: no apparent skin lesion or wound NEURO: Awake and alert. Oriented appropriately.  No apparent focal neuro deficit. PSYCH: Calm. Normal affect.   Procedures:  11/3-right thoracocentesis with removal of 500 cc 11/03-right Thora with removal of 500 cc transudative and culture negative fluid.  Cytology negative as well. 11/07-3 minutes of CPR for VF.  Pulsatile VT s/p DCCV about 2 hours later. 11/11-R/LHC with normal right heart pressures and nonobstructive CAD  Microbiology summarized: 11/3-COVID-19 and influenza PCR nonreactive. 11/3-AFB smear negative.  AFB culture  pending. 11/2-urine culture with Pseudomonas aeruginosa and Enterococcus faecalis 11/8-MRSA PCR, C. Difficile, GIP Giardia and crypto negative.  11/9-blood cultures NGTD.  Assessment & Plan: Ventricular fibrillation/torsades followed by VT/prolonged QT: Likely secondary to electrolyte derangements in the setting of aggressive diuresis.  ROSC after 3 minutes of CPR.  Had pulsatile VT after ROSC see requiring 1 DCCV.  TTE with LVEF of 40 to 45% and RWMA on 11/7.Marland Kitchen  Repeat TTE on 11/9 with LVEF of 25 to 30%, GH, G1-DD and moderately reduced RV SF.  R/LHC on 11/11 with normal right heart pressures and without obstructive CAD suggesting NICM. -Cardiology and EP following. -Per EP, not an ideal candidate for ICD implantation now.  Plan for LifeVest prior to discharge. -On nadolol per EP.  Remains in NSR.  Acute on chronic combined CHF: TTE and R/LHC as above.  She had bilateral pleural effusion and anasarca on presentation.  Fluid status improved with IV Lasix.  I&O incomplete.  Only 600 cc charted.  Net -9 L charted.  BP slightly soft this morning but improved. -Cardiology following and managing. -On IV Lasix 80 mg twice daily -GDMT- losartan, Aldactone -Monitor fluid status, renal functions and electrolytes. -Liberating diet due to poor p.o. intake.  Anasarca: Multifactorial including CHF and severe malnutrition with severe hypoalbuminemia.  Concern for protein-losing nephropathy.  Extensive work-up by GI as above.  Improved. -Diuretics as above.   -Optimize nutrition-see below  Acute respiratory failure with hypoxia-in the setting of the above.  Resolved. -Manage CHF/fluid overload as above. -Encourage IS, OOB/PT/OT  Hyponatremia: Likely from hypervolemia.  Improved.   Hypokalemia/hypomagnesemia/hypophosphatemia: Resolved.  K6.2 (hemolyzed sample).  5.1 on repeat.  -Recheck in the morning  Symptomatic normocytic anemia: Hgb 12 on admit>> 7.3>1u>> 9.  on presentation.  Hemoccult negative.   No iron deficiency.  Likely anemia of critical illness and malnutrition.  She has low transferrin suggesting poor GI absorption.  Recent Labs    05/10/21 0440 05/11/21 0317 05/11/21 1007 05/11/21 1012 05/12/21 0400 05/13/21 0455 05/14/21 0400 05/14/21 2300 05/15/21 0435 05/16/21 0200  HGB 8.3* 8.9* 9.5* 9.9*  9.9* 8.1* 7.3* 7.3* 8.9* 9.0* 8.4*  -Continue monitoring.  Spinal stenosis/lower extremity weakness/ambulatory dysfunction -PT/OT  Right lower extremity DVT-age-indeterminate DVT extending all the way to the iliac vessels -Change IV heparin to p.o. Eliquis  Acute right renal infarct-about 10% of right renal cortex.  From arrhythmia?  Shunt?  Renal function stable. -Change IV heparin to p.o. Eliquis  Lower back pain/pelvic pain/dysuria: Resolved.  UA with small LE but no nitrite or bacteria.  Previous urine culture with Pseudomonas and Enterococcus faecalis, likely colonization versus true infection. -Follow urine culture -P.o. Tylenol with as needed oxycodone for pain.   -Bowel regimen for constipation-seems to be resolving.  Severe malnutrition as evidenced by anasarca and severe hypoalbuminemia.  P.o. intake remains poor. -Agree with liberating diet -Continue calorie count -Continue weaning off TPN at 50% for now. Body mass index is 21.58  kg/m. Nutrition Problem: Severe Malnutrition Etiology: chronic illness (chronic N/V/D) Signs/Symptoms: severe muscle depletion, energy intake < or equal to 50% for > or equal to 1 month Interventions: TPN   DVT prophylaxis:  Patient is on full anticoagulation for DVT.  Code Status: Full code Family Communication: Updated patient's husband over the phone on 11/14.  None at bedside today. Level of care: Telemetry Cardiac Status is: Inpatient  Remains inpatient appropriate because: Fluid overload/CHF exacerbation requiring IV diuretics and severe malnutrition requiring TPN   Consultants:   Cardiology Electrophysiology Pulmonology Gastroenterology   Sch Meds:  Scheduled Meds:  apixaban  10 mg Oral BID   Followed by   Derrill Memo ON 05/22/2021] apixaban  5 mg Oral BID   Chlorhexidine Gluconate Cloth  6 each Topical Daily   feeding supplement  1 Container Oral TID BM   furosemide  80 mg Intravenous Q12H   losartan  25 mg Oral Daily   mouth rinse  15 mL Mouth Rinse BID   megestrol  400 mg Oral BID   multivitamin with minerals  1 tablet Oral Daily   nadolol  20 mg Oral Daily   sodium chloride flush  10-40 mL Intracatheter Q12H   sodium chloride flush  3 mL Intravenous Q12H   spironolactone  25 mg Oral Daily   sucralfate  1 g Oral BID   thiamine  100 mg Oral Daily   vitamin A  10,000 Units Oral Daily   Continuous Infusions:  sodium chloride 10 mL/hr at 05/11/21 2100   TPN ADULT (ION) 30 mL/hr at 05/16/21 0100   PRN Meds:.sodium chloride, acetaminophen, bisacodyl, diphenhydrAMINE, docusate sodium, Gerhardt's butt cream, hydrOXYzine, melatonin, oxyCODONE, sodium chloride flush  Antimicrobials: Anti-infectives (From admission, onward)    None        I have personally reviewed the following labs and images: CBC: Recent Labs  Lab 05/11/21 0317 05/11/21 1007 05/12/21 0400 05/13/21 0455 05/14/21 0400 05/14/21 2300 05/15/21 0435 05/16/21 0200  WBC 7.0  --  7.7 7.2 7.6  --  7.2  --   HGB 8.9*   < > 8.1* 7.3* 7.3* 8.9* 9.0* 8.4*  HCT 24.6*   < > 22.9* 20.2* 21.5* 25.0* 25.6* 24.6*  MCV 93.5  --  94.2 93.5 97.3  --  93.8  --   PLT 224  --  200 194 249  --  269  --    < > = values in this interval not displayed.   BMP &GFR Recent Labs  Lab 05/11/21 0317 05/11/21 1007 05/12/21 0400 05/12/21 1620 05/13/21 0455 05/14/21 0400 05/14/21 0554 05/15/21 0435  NA 137   < > 134* 134* 133* 132* 134* 136  K 3.6   < > 3.5 3.6 3.8 6.2* 5.1 4.4  CL 96*   < > 95* 96* 98 102 101 103  CO2 36*   < > 34* 32 31 26 26 25   GLUCOSE 106*   < > 111* 108* 113* 446* 102* 115*   BUN 5*   < > 9 10 13 15 16 17   CREATININE 0.44   < > 0.45 0.42* 0.45 0.53 0.53 0.52  CALCIUM 7.6*   < > 7.6* 7.7* 7.7* 8.3* 8.6* 8.7*  MG 1.8  --  1.6* 2.1 2.0 2.2 1.7 2.0  PHOS 2.7  --  2.7  --  2.2* 4.9* 3.7  --    < > = values in this interval not displayed.   Estimated Creatinine Clearance: 51.5 mL/min (by C-G formula  based on SCr of 0.52 mg/dL). Liver & Pancreas: Recent Labs  Lab 05/10/21 0440 05/14/21 0400 05/14/21 0554  AST 22 31 31   ALT 14 21 19   ALKPHOS 92 111 125  BILITOT 0.6 <0.1* 0.5  PROT 4.3* 4.2* 4.4*  ALBUMIN 1.9* 1.6* 1.7*   No results for input(s): LIPASE, AMYLASE in the last 168 hours. No results for input(s): AMMONIA in the last 168 hours. Diabetic: No results for input(s): HGBA1C in the last 72 hours. Recent Labs  Lab 05/14/21 0706 05/14/21 2334 05/15/21 1640 05/15/21 2120 05/16/21 0609  GLUCAP 105* 143* 101* 109* 97   Cardiac Enzymes: No results for input(s): CKTOTAL, CKMB, CKMBINDEX, TROPONINI in the last 168 hours. No results for input(s): PROBNP in the last 8760 hours. Coagulation Profile: No results for input(s): INR, PROTIME in the last 168 hours. Thyroid Function Tests: No results for input(s): TSH, T4TOTAL, FREET4, T3FREE, THYROIDAB in the last 72 hours. Lipid Profile: Recent Labs    05/14/21 0400 05/14/21 0554  TRIG 325* 123   Anemia Panel: No results for input(s): VITAMINB12, FOLATE, FERRITIN, TIBC, IRON, RETICCTPCT in the last 72 hours. Urine analysis:    Component Value Date/Time   COLORURINE YELLOW 05/15/2021 1300   APPEARANCEUR CLEAR 05/15/2021 1300   LABSPEC 1.008 05/15/2021 1300   PHURINE 5.0 05/15/2021 1300   GLUCOSEU NEGATIVE 05/15/2021 1300   HGBUR NEGATIVE 05/15/2021 1300   BILIRUBINUR NEGATIVE 05/15/2021 1300   BILIRUBINUR negative 03/30/2020 1215   KETONESUR NEGATIVE 05/15/2021 1300   PROTEINUR NEGATIVE 05/15/2021 1300   UROBILINOGEN 0.2 03/30/2020 1215   NITRITE NEGATIVE 05/15/2021 1300   LEUKOCYTESUR SMALL  (A) 05/15/2021 1300   Sepsis Labs: Invalid input(s): PROCALCITONIN, [place]  Microbiology: Recent Results (from the past 240 hour(s))  MRSA Next Gen by PCR, Nasal     Status: None   Collection Time: 05/07/21  1:27 PM   Specimen: Nasal Mucosa; Nasal Swab  Result Value Ref Range Status   MRSA by PCR Next Gen NOT DETECTED NOT DETECTED Final    Comment: (NOTE) The GeneXpert MRSA Assay (FDA approved for NASAL specimens only), is one component of a comprehensive MRSA colonization surveillance program. It is not intended to diagnose MRSA infection nor to guide or monitor treatment for MRSA infections. Test performance is not FDA approved in patients less than 24 years old. Performed at [facility], [place] [address] [place], [place] [address]   Giardia/Cryptosporidium EIA     Status: None   Collection Time: 05/08/21  2:48 PM   Specimen: Per Rectum; Stool  Result Value Ref Range Status   Giardia Ag, Stl Negative Negative Final   Cryptosporidium EIA Negative Negative Final    Comment: (NOTE) Performed At: [facility] [place], [place] [id number] Rush Farmer MD DG:[id number]    Source of Sample STOOL  Final    Comment: Performed at [facility], [place] [address], [place], [place] [address]  C Difficile Quick Screen (NO PCR Reflex)     Status: None   Collection Time: 05/08/21  2:48 PM   Specimen: Per Rectum; Stool  Result Value Ref Range Status   C Diff antigen NEGATIVE NEGATIVE Final   C Diff toxin NEGATIVE NEGATIVE Final   C Diff interpretation No C. difficile detected.  Final    Comment: Performed at [facility], [place] [address], [place],  [address]  Gastrointestinal Panel by PCR , Stool     Status: None   Collection Time:  05/08/21  2:48 PM   Specimen: Per Rectum; Stool  Result Value Ref Range Status   Campylobacter species NOT DETECTED NOT DETECTED Final   Plesimonas shigelloides NOT DETECTED NOT DETECTED Final   Salmonella  species NOT DETECTED NOT DETECTED Final   Yersinia enterocolitica NOT DETECTED NOT DETECTED Final   Vibrio species NOT DETECTED NOT DETECTED Final   Vibrio cholerae NOT DETECTED NOT DETECTED Final   Enteroaggregative E coli (EAEC) NOT DETECTED NOT DETECTED Final   Enteropathogenic E coli (EPEC) NOT DETECTED NOT DETECTED Final   Enterotoxigenic E coli (ETEC) NOT DETECTED NOT DETECTED Final   Shiga like toxin producing E coli (STEC) NOT DETECTED NOT DETECTED Final   Shigella/Enteroinvasive E coli (EIEC) NOT DETECTED NOT DETECTED Final   Cryptosporidium NOT DETECTED NOT DETECTED Final   Cyclospora cayetanensis NOT DETECTED NOT DETECTED Final   Entamoeba histolytica NOT DETECTED NOT DETECTED Final   Giardia lamblia NOT DETECTED NOT DETECTED Final   Adenovirus F40/41 NOT DETECTED NOT DETECTED Final   Astrovirus NOT DETECTED NOT DETECTED Final   Norovirus GI/GII NOT DETECTED NOT DETECTED Final   Rotavirus A NOT DETECTED NOT DETECTED Final   Sapovirus (I, II, IV, and V) NOT DETECTED NOT DETECTED Final    Comment: Performed at [facility], [address] [place], [place] [address]  Culture, blood (Routine X 2) w Reflex to ID Panel     Status: None   Collection Time: 05/09/21 10:53 AM   Specimen: BLOOD LEFT ARM  Result Value Ref Range Status   Specimen Description BLOOD LEFT ARM  Final   Special Requests   Final    BOTTLES DRAWN AEROBIC ONLY Blood Culture results may not be optimal due to an inadequate volume of blood received in culture bottles   Culture   Final    NO GROWTH 5 DAYS Performed at [facility], [address], [place], [place] [address]    Report Status 05/14/2021 FINAL  Final    Radiology Studies: No results found.   Alika Saladin T. [place]  If 7PM-7AM, please contact night-coverage [email or web address] 05/16/2021, 10:05 AM

## 2021-05-16 NOTE — Progress Notes (Signed)
Calorie Count Note: Day 1 Results  48-hour calorie count ordered. Calorie count started 11/15 at 1300. Please see day 1 results below.  Diet: Regular, thin liquids Supplements:  - Boost Breeze TID  Day 1: 11/15 Dinner: 97 kcal, 2 grams of protein 11/16 Breakfast: 133 kcal, 1 gram of protein 11/16 Lunch: 366 kcal, 9 grams of protein Supplements: 700 kcal, 25 grams of protein  Day 1 total 24-hour intake: 1296 kcal (72% of minimum estimated needs) 37 grams of protein (35% of minimum estimated needs)  Nutrition Diagnosis: Severe Malnutrition related to chronic illness (chronic N/V/D) as evidenced by severe muscle depletion, energy intake < or equal to 50% for > or equal to 1 month.  Goal: Patient will meet greater than or equal to 90% of their needs.  Intervention: - Continue 48-hour calorie count for 1 more day - Downgrade diet to dysphagia 3 (mechanical soft) for ease of intake as pt does not have dentures present per RN - Pt may require Cortrak placement and enteral nutrition in order to wean further from TPN, discussed with MD who would like to hold off on Cortrak today and reassess on Friday 11/18 - Continue Boost Breeze TID - Continue TPN per Pharmacy - Continue vitamin A 10,000 units x 14 days   Gustavus Bryant, MS, RD, LDN Inpatient Clinical Dietitian Please see AMiON for contact information.

## 2021-05-16 NOTE — TOC Progression Note (Addendum)
Transition of Care [facility]) - Progression Note    Patient Details  Name: Debbie Barry MRN: [id number] Date of Birth: 10-18-1950  [facility] CM/SW Contact  Zenon Mayo, RN Phone Number: 05/16/2021, 6:24 PM  Clinical Narrative:    Patient is from [facility],  who made referral to [facility] when she was [facility] for [facility], [place], [place], [place] and social work,  she wants to go home, with [facility], will need orders., [employer] is supplying the hoyer lift.  Patient already has w/chair and bsc.  She may need ambulance transport at dc. Conts on 48 hr cal count, iv lasix, TPN.  11/17- NCM confirmed with Levada Dy with [facility] that patient is active with them, she states yes. Looks like she may be going home with TPN, NCM contacted Carolynn Sayers with [employer], she will follow along for the TPN and will coordinate with [facility].  NCM checked with Alma Friendly in [place], she states they plan to wean patient off the TPN before she goes home. She will be re-eval on Friday for cortak for enteral feeds.  Carolynn Sayers will continue to follow.  NCM informed MD that we could do TPN at home with [facility] if needed, he states she might be off TPN tomorrow. If not, it should be G-tube not TPN since the main problem is poor appetite now. Will continue to follow .   Expected Discharge Plan: [place] Barriers to Discharge: Continued Medical Work up  Expected Discharge Plan and Services Expected Discharge Plan: [place]   Discharge Planning Services: CM Consult Post Acute Care Choice: Home Health, Durable Medical Equipment Living arrangements for the past 2 months: Single Family Home                 DME Arranged: Other see comment [facility] lift) DME Agency: [employer] Date DME Agency Contacted: 05/04/21 Time DME Agency Contacted: 49 Representative spoke with at DME Agency: [place] Arranged: RN, PT, OT, Nurse's Aide, Speech Therapy, Social Work [facility]  Agency: [facility] Date [place]: 05/04/21 Time [place]: [place] Representative spoke with at [place]: [place] ([place]) Interventions    Readmission Risk Interventions No flowsheet data found.

## 2021-05-17 LAB — COMPREHENSIVE METABOLIC PANEL
ALT: 18 U/L (ref 0–44)
AST: 23 U/L (ref 15–41)
Albumin: 1.8 g/dL — ABNORMAL LOW (ref 3.5–5.0)
Alkaline Phosphatase: 108 U/L (ref 38–126)
Anion gap: 7 (ref 5–15)
BUN: 25 mg/dL — ABNORMAL HIGH (ref 8–23)
CO2: 23 mmol/L (ref 22–32)
Calcium: 7.8 mg/dL — ABNORMAL LOW (ref 8.9–10.3)
Chloride: 106 mmol/L (ref 98–111)
Creatinine, Ser: 0.54 mg/dL (ref 0.44–1.00)
GFR, Estimated: >60 mL/min (ref >60)
Glucose, Bld: 417 mg/dL — ABNORMAL HIGH (ref 70–99)
Potassium: 4.3 mmol/L (ref 3.5–5.1)
Sodium: 136 mmol/L (ref 135–145)
Total Bilirubin: 0.2 mg/dL — ABNORMAL LOW (ref 0.3–1.2)
Total Protein: 4.5 g/dL — ABNORMAL LOW (ref 6.5–8.1)

## 2021-05-17 LAB — PHOSPHORUS: Phosphorus: 5.9 mg/dL — ABNORMAL HIGH (ref 2.5–4.6)

## 2021-05-17 LAB — 5 HIAA, QUANTITATIVE, URINE, 24 HOUR
5-HIAA, Ur: 1.2 mg/L
5-HIAA,Quant.,24 Hr Urine: 7.1 mg/24 hr (ref 0.0–14.9)
Total Volume: 5900

## 2021-05-17 LAB — GLUCOSE, CAPILLARY
Glucose-Capillary: 117 mg/dL — ABNORMAL HIGH (ref 70–99)
Glucose-Capillary: 103 mg/dL — ABNORMAL HIGH (ref 70–99)
Glucose-Capillary: 123 mg/dL — ABNORMAL HIGH (ref 70–99)

## 2021-05-17 LAB — HEMOGLOBIN AND HEMATOCRIT, BLOOD
HCT: 24.3 % — ABNORMAL LOW (ref 36.0–46.0)
Hemoglobin: 8.5 g/dL — ABNORMAL LOW (ref 12.0–15.0)

## 2021-05-17 LAB — MAGNESIUM: Magnesium: 2.1 mg/dL (ref 1.7–2.4)

## 2021-05-17 MED ORDER — STERILE WATER FOR INJECTION IV SOLN
INTRAVENOUS | Status: AC
  Filled 2021-05-17: qty 374.4

## 2021-05-17 MED ORDER — PROSOURCE PLUS PO LIQD
30.0000 mL | Freq: Two times a day (BID) | ORAL | Status: DC
  Administered 2021-05-18 – 2021-05-22 (×7): 30 mL via ORAL
  Filled 2021-05-17 (×8): qty 30

## 2021-05-17 MED ORDER — BUMETANIDE 1 MG PO TABS
1.0000 mg | ORAL_TABLET | Freq: Every day | ORAL | Status: DC
  Administered 2021-05-17 – 2021-05-18 (×2): 1 mg via ORAL
  Filled 2021-05-17 (×2): qty 1

## 2021-05-17 NOTE — Progress Notes (Signed)
Nutrition Follow-up  DOCUMENTATION CODES:   Severe malnutrition in context of chronic illness  INTERVENTION:   - Continue Boost Breeze po TID, each supplement provides 250 kcal and 9 grams of protein  - Continue ProSource Plus 30 ml po BID, each supplement provides 100 kcal and 15 grams of protein  - Continue dysphagia 3 diet for ease of intake  - Continue to replete vitamin A with 10,000 units vitamin A x 14 days total  - Encourage PO intake  NUTRITION DIAGNOSIS:   Severe Malnutrition related to chronic illness (chronic N/V/D) as evidenced by severe muscle depletion, energy intake < or equal to 50% for > or equal to 1 month.  Ongoing, being addressed via oral nutrition supplements  GOAL:   Patient will meet greater than or equal to 90% of their needs  Progressing  MONITOR:   PO intake, Supplement acceptance, Labs  REASON FOR ASSESSMENT:   Consult Assessment of nutrition requirement/status  ASSESSMENT:   70 yo female admitted with DVT R common femoral vein and R femoral vein, acute on chronic CHF with anasarca. PMH includes HTN, PAD, fatty liver, chronic back pain, spinal stenosis, PVD, iron overload, gastritis, gastric ulcers.  11/03 - s/p R thoracentesis   11/07 - s/p V tach/V fib arrest s/p CPR x 3 minutes 11/09 - TPN initiated, Cortrak placement attempted but pt unable to tolerate 11/11 - TPN increased to goal, s/p R/L heart cath showing severely depressed LV 11/12 - TPN reduced to 75% goal rate 11/13 - TPN reduced to 50% goal rate 11/18 - TPN stop date  Spoke with pt at bedside. Pt continues to have good PO intake and is consuming supplements. Pt consumed 1 Boost Breeze and 1 ProSource Plus at RD time of visit. Pt does not like ProSource Plus but is willing to drink them because she understands how important protein is. Pt also reported consuming 100% of grilled cheese and some of breakfast potatoes for breakfast this morning. Pt reports an episode of diarrhea  this morning but that her diarrhea has improved. Pt denies any N/V for several days. Plan for TPN to turn off tonight at 1800 as pt has demonstrated ability to meet >75% of kcal needs via PO route per calorie count.  Admit weight: 65.7 kg Current weight: 53.8 kg  Meal Completion: 25-80% x last 8 meals  Medications reviewed and include: ProSource Plus 30 ml po BID, Boost Breeze TID, megace 400 mg BID, MVI with minerals daily, klor-con 40 mEq x 2, spironolactone, sucralfate, thiamine, vitamin A 10,000 units daily, IV magnesium sulfate once, TPN @ 30 ml/hr  Vitamin/Mineral Profile:  Thiamine B1: 149.5 (WNL) Vitamin B12: 743 (WNL) Folate B9: 4.5 (WNL) Vitamin A: 14.0 (low) Vitamin D: 87.74 (WNL) Vitamin C: 0.9 (WNL) Copper: 80 (WNL) Vitamin E (alpha tocopherol): 9.9 (WNL) Vitamin E (gamma tocopherol): 0.7 (WNL) Zinc: 46 (WNL)  Labs reviewed: potassium 3.1, BUN 30, hemoglobin 9.2 CBG's: 103-123 x 24 hours  UOP: 500 ml x 24 hours I/O's: -7.5 L since admit  Diet Order:   Diet Order             DIET DYS 3 Room service appropriate? Yes; Fluid consistency: Thin  Diet effective now                   EDUCATION NEEDS:   No education needs have been identified at this time  Skin:  Skin Assessment: Skin Integrity Issues: Other: pretibial venous stasis ulcer, non-pressure wound to L buttocks  and sacrum  Last BM:  05/17/21  Height:   Ht Readings from Last 1 Encounters:  05/02/21 5\' 1"  (1.549 m)    Weight:   Wt Readings from Last 1 Encounters:  05/18/21 53.8 kg    BMI:  Body mass index is 22.41 kg/m.  Estimated Nutritional Needs:   Kcal:  1800-2000  Protein:  105-120  Fluid:  >1.8 L/day    Gustavus Bryant, MS, RD, LDN Inpatient Clinical Dietitian Please see AMiON for contact information.

## 2021-05-17 NOTE — Progress Notes (Signed)
PROGRESS NOTE  Debbie Barry MVE:[id number] DOB: [birth year]   PCP: Glendale Chard, MD  Patient is from: SNF.  Uses walker at baseline.  DOA: 05/02/2021 LOS: 8  Chief complaints:  Chief Complaint  Patient presents with   Chest Pain   Abdominal Pain     Brief Narrative / Interim history: 70 year old F with PMH of fatty liver, osteomyelitis, HTN, GERD, ambulatory dysfunction and a recent hospitalization for lower extremity weakness due to spinal stenosis for which medical management was recommended and she was discharged to SNF returning to ED with chest pain and palpitation, and found to have age-indeterminate RLE DVT, large bilateral pleural effusion, anasarca, acute right renal infarct comprising less than 10% of right renal cortex.  She was started on IV heparin.  Pleural effusion and anasarca was felt to be due to malnutrition/hypoalbuminemia.  She underwent right Thora with removal of 500 cc transudative culture negative fluid.  Cytology was negative as well.  Started on diuretics.   Patient went into V. fib cardiac arrest the morning of 11/7 with ROSC after 3 minutes of CPR prior to defibrillation.  Then, she went into pulsatile VT and had 1 DCCV.  She was transferred to ICU after cardiology consultation.  K and Mg were found to be low.  TTE with LVEF of 40 to 45% and RWMA on 11/7.Marland Kitchen  Repeat TTE on 11/9 with LVEF of 25 to 30%, GH, G1 DD and moderately reduced RV SF.  R/LHC on 11/11 with normal right heart pressures and without obstructive CAD suggesting NICM.  And also started on TPN on 11/9.    GI consulted for malnutrition.  Concern about protein-losing enteropathy. Extensive work-up significant for low vitamin A, transferrin and ceruloplasmin  but negative for C. difficile, GIP, Giardia, crypto, A1A, and celiac disease.  Diarrhea that has resolved with cholestyramine. intermittent.  TRH assumed care on 11/13.  Continues to require 50% TPN due to poor p.o. intake but seems to be  improving.  GI and cardiology following from [place].  EP to place LifeVest prior to discharge.  Subjective: Seen and examined earlier this morning.  No major events overnight of this morning.  No complaints either.  She says she ate most of her breakfast.  She would like to go home once medically clear.  She says she talked to her husband and he is in agreement.  Objective: Vitals:   05/17/21 0347 05/17/21 0415 05/17/21 0756 05/17/21 1151  BP: (!) 100/51  (!) 106/59 (!) 123/57  Pulse: 76  80 86  Resp: 15  19 (!) 24  Temp: 98.1 F (36.7 C)  98.7 F (37.1 C) 98.7 F (37.1 C)  TempSrc: Oral  Oral Oral  SpO2: 96%  97% 98%  Weight:  52.9 kg    Height:        Intake/Output Summary (Last 24 hours) at 05/17/2021 1506 Last data filed at 05/17/2021 0700 Gross per 24 hour  Intake 1356.5 ml  Output 475 ml  Net 881.5 ml   Filed Weights   05/14/21 0730 05/16/21 0526 05/17/21 0415  Weight: 57.6 kg 51.8 kg 52.9 kg    Examination:  GENERAL: Frail looking elderly female.  No apparent distress. HEENT: MMM.  Vision and hearing grossly intact.  NECK: Supple.  No apparent JVD.  RESP: 98% on RA.  No IWOB.  Fair aeration bilaterally. CVS:  RRR. Heart sounds normal.  ABD/GI/GU: BS+. Abd soft, NTND.  MSK/EXT:  Moves extremities. No apparent deformity.  Trace dependent edema.  SKIN: no apparent skin lesion or wound NEURO: Awake and alert. Oriented appropriately.  No apparent focal neuro deficit. PSYCH: Calm. Normal affect.   Procedures:  11/3-right thoracocentesis with removal of 500 cc 11/03-right Thora with removal of 500 cc transudative and culture negative fluid.  Cytology negative as well. 11/07-3 minutes of CPR for VF.  Pulsatile VT s/p DCCV about 2 hours later. 11/11-R/LHC with normal right heart pressures and nonobstructive CAD  Microbiology summarized: 11/3-COVID-19 and influenza PCR nonreactive. 11/3-AFB smear negative.  AFB culture pending. 11/2-urine culture with Pseudomonas  aeruginosa and Enterococcus faecalis 11/8-MRSA PCR, C. Difficile, GIP Giardia and crypto negative.  11/9-blood cultures NGTD. 11/15-urine culture pending.  Assessment & Plan: Ventricular fibrillation/torsades followed by VT/prolonged QT: Likely secondary to electrolyte derangements in the setting of aggressive diuresis.  ROSC after 3 minutes of CPR.  Had pulsatile VT after ROSC see requiring 1 DCCV.  TTE with LVEF of 40 to 45% and RWMA on 11/7.Marland Kitchen  Repeat TTE on 11/9 with LVEF of 25 to 30%, GH, G1-DD and moderately reduced RV SF.  R/LHC on 11/11 with normal right heart pressures and without obstructive CAD suggesting NICM. -Cardiology and EP following. -Per EP, not an ideal candidate for ICD implantation now.  Plan for LifeVest prior to discharge. -On nadolol per EP.  Remains in NSR.  Acute on chronic combined CHF: TTE and R/LHC as above.  She had bilateral pleural effusion and anasarca on presentation.  Fluid status improved with IV Lasix.  Net -8 L, probably more but not well captured.  BP slightly soft this morning but improved. -Cardiology following from [place]. -Transition to p.o. Bumex 1 mg daily given hypoalbuminemia. -GDMT- losartan, Aldactone -Monitor fluid status, renal functions and electrolytes. -Regular diet due to poor p.o. intake  Anasarca: Multifactorial including CHF and severe malnutrition with severe hypoalbuminemia.  Concern for protein-losing nephropathy.  Extensive work-up by GI as above.  Improved. -Diuretics as above.   -Optimize nutrition-see below  Acute respiratory failure with hypoxia-in the setting of the above.  Resolved. -Manage CHF/fluid overload as above. -Encourage IS, OOB/PT/OT  Hyponatremia: Resolved.   Hypokalemia/hypomagnesemia/hypophosphatemia: Resolved.  Symptomatic normocytic anemia: Hgb 12 on admit>> 7.3>1u>> 9.  on presentation.  Hemoccult negative.  No iron deficiency.  Likely anemia of critical illness and malnutrition.  She has low transferrin  suggesting poor GI absorption.  Recent Labs    05/11/21 0317 05/11/21 1007 05/11/21 1012 05/12/21 0400 05/13/21 0455 05/14/21 0400 05/14/21 2300 05/15/21 0435 05/16/21 0200 05/17/21 0500  HGB 8.9* 9.5* 9.9*  9.9* 8.1* 7.3* 7.3* 8.9* 9.0* 8.4* 8.5*  -Continue monitoring.  Spinal stenosis/lower extremity weakness/ambulatory dysfunction -Home health and DME ordered.  Right lower extremity DVT-age-indeterminate DVT extending all the way to the iliac vessels -On p.o. Eliquis  Acute right renal infarct-about 10% of right renal cortex.  From arrhythmia?  Shunt?  Renal function stable. -Change IV heparin to p.o. Eliquis  Lower back pain/pelvic pain/dysuria: Resolved.  UA with small LE but no nitrite or bacteria.  Previous urine culture with Pseudomonas and Enterococcus faecalis, likely colonization versus true infection. -Follow urine culture -P.o. Tylenol with as needed oxycodone for pain.   -Bowel regimen for constipation-seems to be resolving.  Severe malnutrition as evidenced by anasarca and severe hypoalbuminemia.  P.o. intake seems to be picking up.  About 89% of minimum estimated needs on day 2 of calorie count. -Continue regular diet -Hopefully we can discontinue TPN on 11/18. Body mass index is 22.04 kg/m. Nutrition Problem: Severe Malnutrition Etiology:  chronic illness (chronic N/V/D) Signs/Symptoms: severe muscle depletion, energy intake < or equal to 50% for > or equal to 1 month Interventions: TPN   DVT prophylaxis:  Patient is on full anticoagulation for DVT.  Code Status: Full code Family Communication:   None at bedside today. Level of care: Telemetry Cardiac Status is: Inpatient  Remains inpatient appropriate because: Poor p.o. intake requiring TPN   Consultants:  Cardiology Electrophysiology Pulmonology Gastroenterology   Sch Meds:  Scheduled Meds:  (feeding supplement) PROSource Plus  30 mL Oral BID BM   apixaban  10 mg Oral BID   Followed by    Derrill Memo ON 05/22/2021] apixaban  5 mg Oral BID   bumetanide  1 mg Oral Daily   Chlorhexidine Gluconate Cloth  6 each Topical Daily   feeding supplement  1 Container Oral TID BM   losartan  25 mg Oral Daily   mouth rinse  15 mL Mouth Rinse BID   megestrol  400 mg Oral BID   multivitamin with minerals  1 tablet Oral Daily   nadolol  20 mg Oral Daily   sodium chloride flush  10-40 mL Intracatheter Q12H   sodium chloride flush  3 mL Intravenous Q12H   spironolactone  25 mg Oral Daily   sucralfate  1 g Oral BID   thiamine  100 mg Oral Daily   vitamin A  10,000 Units Oral Daily   Continuous Infusions:  sodium chloride 10 mL/hr at 05/11/21 2100   TPN ADULT (ION) 30 mL/hr at 05/17/21 0200   TPN ADULT (ION)     PRN Meds:.sodium chloride, acetaminophen, bisacodyl, diphenhydrAMINE, docusate sodium, Gerhardt's butt cream, hydrOXYzine, melatonin, oxyCODONE, sodium chloride flush  Antimicrobials: Anti-infectives (From admission, onward)    None        I have personally reviewed the following labs and images: CBC: Recent Labs  Lab 05/11/21 0317 05/11/21 1007 05/12/21 0400 05/13/21 0455 05/14/21 0400 05/14/21 2300 05/15/21 0435 05/16/21 0200 05/17/21 0500  WBC 7.0  --  7.7 7.2 7.6  --  7.2  --   --   HGB 8.9*   < > 8.1* 7.3* 7.3* 8.9* 9.0* 8.4* 8.5*  HCT 24.6*   < > 22.9* 20.2* 21.5* 25.0* 25.6* 24.6* 24.3*  MCV 93.5  --  94.2 93.5 97.3  --  93.8  --   --   PLT 224  --  200 194 249  --  269  --   --    < > = values in this interval not displayed.   BMP &GFR Recent Labs  Lab 05/12/21 0400 05/12/21 1620 05/13/21 0455 05/14/21 0400 05/14/21 0554 05/15/21 0435 05/17/21 0500  NA 134*   < > 133* 132* 134* 136 136  K 3.5   < > 3.8 6.2* 5.1 4.4 4.3  CL 95*   < > 98 102 101 103 106  CO2 34*   < > 31 26 26 25 23   GLUCOSE 111*   < > 113* 446* 102* 115* 417*  BUN 9   < > 13 15 16 17  25*  CREATININE 0.45   < > 0.45 0.53 0.53 0.52 0.54  CALCIUM 7.6*   < > 7.7* 8.3* 8.6* 8.7*  7.8*  MG 1.6*   < > 2.0 2.2 1.7 2.0 2.1  PHOS 2.7  --  2.2* 4.9* 3.7  --  5.9*   < > = values in this interval not displayed.   Estimated Creatinine Clearance: 51.5 mL/min (by C-G formula  based on SCr of 0.54 mg/dL). Liver & Pancreas: Recent Labs  Lab 05/14/21 0400 05/14/21 0554 05/17/21 0500  AST 31 31 23   ALT 21 19 18   ALKPHOS 111 125 108  BILITOT <0.1* 0.5 0.2*  PROT 4.2* 4.4* 4.5*  ALBUMIN 1.6* 1.7* 1.8*   No results for input(s): LIPASE, AMYLASE in the last 168 hours. No results for input(s): AMMONIA in the last 168 hours. Diabetic: No results for input(s): HGBA1C in the last 72 hours. Recent Labs  Lab 05/16/21 1111 05/16/21 1648 05/16/21 2121 05/17/21 0628 05/17/21 1149  GLUCAP 109* 88 116* 117* 103*   Cardiac Enzymes: No results for input(s): CKTOTAL, CKMB, CKMBINDEX, TROPONINI in the last 168 hours. No results for input(s): PROBNP in the last 8760 hours. Coagulation Profile: No results for input(s): INR, PROTIME in the last 168 hours. Thyroid Function Tests: No results for input(s): TSH, T4TOTAL, FREET4, T3FREE, THYROIDAB in the last 72 hours. Lipid Profile: No results for input(s): CHOL, HDL, LDLCALC, TRIG, CHOLHDL, LDLDIRECT in the last 72 hours.  Anemia Panel: No results for input(s): VITAMINB12, FOLATE, FERRITIN, TIBC, IRON, RETICCTPCT in the last 72 hours. Urine analysis:    Component Value Date/Time   COLORURINE YELLOW 05/15/2021 1300   APPEARANCEUR CLEAR 05/15/2021 1300   LABSPEC 1.008 05/15/2021 1300   PHURINE 5.0 05/15/2021 1300   GLUCOSEU NEGATIVE 05/15/2021 1300   HGBUR NEGATIVE 05/15/2021 1300   BILIRUBINUR NEGATIVE 05/15/2021 1300   BILIRUBINUR negative 03/30/2020 1215   KETONESUR NEGATIVE 05/15/2021 1300   PROTEINUR NEGATIVE 05/15/2021 1300   UROBILINOGEN 0.2 03/30/2020 1215   NITRITE NEGATIVE 05/15/2021 1300   LEUKOCYTESUR SMALL (A) 05/15/2021 1300   Sepsis Labs: Invalid input(s): PROCALCITONIN, [place]  Microbiology: Recent  Results (from the past 240 hour(s))  Giardia/Cryptosporidium EIA     Status: None   Collection Time: 05/08/21  2:48 PM   Specimen: Per Rectum; Stool  Result Value Ref Range Status   Giardia Ag, Stl Negative Negative Final   Cryptosporidium EIA Negative Negative Final    Comment: (NOTE) Performed At: [facility] [place], [place] [id number] Rush Farmer MD GY:[id number]    Source of Sample STOOL  Final    Comment: Performed at [facility], [place] [address] [place], [place] [address]  C Difficile Quick Screen (NO PCR Reflex)     Status: None   Collection Time: 05/08/21  2:48 PM   Specimen: Per Rectum; Stool  Result Value Ref Range Status   C Diff antigen NEGATIVE NEGATIVE Final   C Diff toxin NEGATIVE NEGATIVE Final   C Diff interpretation No C. difficile detected.  Final    Comment: Performed at [facility], [place] [address], [place], [place] [address]  Gastrointestinal Panel by PCR , Stool     Status: None   Collection Time: 05/08/21  2:48 PM   Specimen: Per Rectum; Stool  Result Value Ref Range Status   Campylobacter species NOT DETECTED NOT DETECTED Final   Plesimonas shigelloides NOT DETECTED NOT DETECTED Final   Salmonella species NOT DETECTED NOT DETECTED Final   Yersinia enterocolitica NOT DETECTED NOT DETECTED Final   Vibrio species NOT DETECTED NOT DETECTED Final   Vibrio cholerae NOT DETECTED NOT DETECTED Final   Enteroaggregative E coli (EAEC) NOT DETECTED NOT DETECTED Final   Enteropathogenic E coli (EPEC) NOT DETECTED NOT DETECTED Final   Enterotoxigenic E coli (ETEC) NOT DETECTED NOT DETECTED Final   Shiga like toxin producing E coli (STEC) NOT DETECTED  NOT DETECTED Final   Shigella/Enteroinvasive E coli (EIEC) NOT DETECTED NOT DETECTED Final   Cryptosporidium NOT DETECTED NOT DETECTED Final   Cyclospora cayetanensis NOT DETECTED NOT DETECTED Final   Entamoeba histolytica NOT DETECTED NOT DETECTED Final   Giardia lamblia NOT  DETECTED NOT DETECTED Final   Adenovirus F40/41 NOT DETECTED NOT DETECTED Final   Astrovirus NOT DETECTED NOT DETECTED Final   Norovirus GI/GII NOT DETECTED NOT DETECTED Final   Rotavirus A NOT DETECTED NOT DETECTED Final   Sapovirus (I, II, IV, and V) NOT DETECTED NOT DETECTED Final    Comment: Performed at [facility], [address] [place], [place] [address]  Culture, blood (Routine X 2) w Reflex to ID Panel     Status: None   Collection Time: 05/09/21 10:53 AM   Specimen: BLOOD LEFT ARM  Result Value Ref Range Status   Specimen Description BLOOD LEFT ARM  Final   Special Requests   Final    BOTTLES DRAWN AEROBIC ONLY Blood Culture results may not be optimal due to an inadequate volume of blood received in culture bottles   Culture   Final    NO GROWTH 5 DAYS Performed at [facility] [address], [place], [place] [address]    Report Status 05/14/2021 FINAL  Final    Radiology Studies: No results found.   Makenzee Choudhry T. [place]  If 7PM-7AM, please contact night-coverage [email or web address] 05/17/2021, 3:06 PM

## 2021-05-17 NOTE — Progress Notes (Signed)
Physical Therapy Treatment Patient Details Name: Debbie Barry MRN: [id number] DOB: [birth year] Today's Date: 05/17/2021   History of Present Illness Pt is a 70 y.o. female admitted from SNF on 05/02/21 with chest pain; found to have DVT, AKI, CHF, large bilateral pleural effusions. S/p L-side thoracentesis on 11/3. Pt suffered vfib/vtach arrest on 11/7 requiring 3-min CPR and shock.  Respiratory distress on 11/8 requiring non-rebreather. S/p R/LHC on 11/11. PMH includes spinal stenosis, chronic back pain, CHF, HTN, PVD, obesity; of note, recent admission 03/2021 for BLE weakness, found to have chronic L3-4 compression fx, severe L4-5 canal stenosis with plan for conservative management with lumbar brace.    PT Comments    The pt continues to present with deficits in strength, power, and activity tolerance at this time, but continues to make slow but steady progress with OOB transfers and gait in her room. She requires +2 modA to initiate hip lift and power through BLE for initial stand, but is able to complete the movement with minA of 2. With max cues/encouragement, the pt was also able to complete a circle of ~31ft ambulation in the room with use of RW and minA of 2 to steady. Given significant level of assist needed, continue to recommend SNF but pt continues to refuse and is adamant about d/c home. Will continue to progress as able to allow for safest possible return home with family assist.    Recommendations for follow up therapy are one component of a multi-disciplinary discharge planning process, led by the attending physician.  Recommendations may be updated based on patient status, additional functional criteria and insurance authorization.  Follow Up Recommendations  Skilled nursing-short term rehab (<3 hours/day) (pt continues to decline, will need max HHPT and possibly Clint aide)     Assistance Recommended at [facility] bed (lift)     Recommendations for Other Services       Precautions / Restrictions Precautions Precautions: Fall;Other (comment) Precaution Comments: bladder/bowel incontinence Required Braces or Orthoses: Spinal Brace Spinal Brace: Lumbar corset Other Brace: Lumbar corset from prior admission (not in room) Restrictions Weight Bearing Restrictions: No     Mobility  Bed Mobility Overal bed mobility: Needs Assistance Bed Mobility: Supine to Sit     Supine to sit: Min assist     General bed mobility comments: significantly increased time and verbal cues to complete. pt stopping movements repeatedly to await cues from therapist to continue. reports she is fatigued after each movement of her LE requirign a few moments of rest    Transfers Overall transfer level: Needs assistance Equipment used: Rolling walker (2 wheels) Transfers: Sit to/from Stand;Bed to chair/wheelchair/BSC Sit to Stand: Min assist;Mod assist;+2 physical assistance     Step pivot transfers: Min assist;+2 safety/equipment     General transfer comment: pt completed x3 sit-stand during session and single pivoting transfer to [facility]. min-modA to power up from surface despite cues for UE placement and to improve use of LE    Ambulation/Gait Ambulation/Gait assistance: Min assist;+2 safety/equipment Gait Distance (Feet): 12 Feet Assistive device: Rolling walker (2 wheels) Gait Pattern/deviations: Step-to pattern;Decreased stride length;Trunk flexed Gait velocity: decreased Gait velocity interpretation: <1.31 ft/sec, indicative of household ambulator   General Gait Details: pt completing circle in room with max cues/encouragement.       Balance Overall balance assessment: Needs assistance Sitting-balance support: No upper extremity supported;Feet unsupported Sitting balance-Leahy Scale: Poor Sitting balance - Comments: pt with significant trunk flexion, leaning R  or L to prop on arms, unable to sit up without assist.    Standing balance support: Bilateral upper extremity supported;During functional activity Standing balance-Leahy Scale: Poor Standing balance comment: dependent on BUE support and assist; dependent for pericare                            Cognition Arousal/Alertness: Awake/alert Behavior During Therapy: Flat affect Overall Cognitive Status: No family/caregiver present to determine baseline cognitive functioning Area of Impairment: Attention;Memory;Following commands;Safety/judgement;Awareness;Problem solving                   Current Attention Level: Sustained Memory: Decreased short-term memory Following Commands: Follows one step commands inconsistently;Follows one step commands with increased time Safety/Judgement: Decreased awareness of safety;Decreased awareness of deficits Awareness: Intellectual Problem Solving: Slow processing;Decreased initiation;Difficulty sequencing;Requires verbal cues General Comments: pt continues to need significant cues and encouragement to complete movements/tasks. Difficult to determine extent of true cognitive impairment versus desire to mobilize           General Comments General comments (skin integrity, edema, etc.): HR in 90s through session      Pertinent Vitals/Pain Pain Assessment: No/denies pain Pain Location: pt reports pain in back and buttocks after sitting in recliner     PT Goals (current goals can now be found in the care plan section) Acute Rehab PT Goals Patient Stated Goal: go home PT Goal Formulation: With patient Time For Goal Achievement: 05/18/21 Potential to Achieve Goals: Fair Progress towards PT goals: Progressing toward goals    Frequency    Min 3X/week      PT Plan Current plan remains appropriate    Co-evaluation PT/OT/SLP Co-Evaluation/Treatment: Yes Reason for Co-Treatment: To address functional/ADL transfers;For patient/therapist safety (pt with poor activity tolerance) PT goals  addressed during session: Mobility/safety with mobility;Balance;Proper use of DME;Strengthening/ROM        AM-PAC PT "6 Clicks" Mobility   Outcome Measure  Help needed turning from your back to your side while in a flat bed without using bedrails?: A Little Help needed moving from lying on your back to sitting on the side of a flat bed without using bedrails?: A Little Help needed moving to and from a bed to a chair (including a wheelchair)?: Total Help needed standing up from a chair using your arms (e.g., wheelchair or bedside chair)?: Total Help needed to walk in hospital room?: Total Help needed climbing 3-5 steps with a railing? : Total 6 Click Score: 10    End of Session Equipment Utilized During Treatment: Gait belt Activity Tolerance: Patient limited by fatigue Patient left: in chair;with call bell/phone within reach;with chair alarm set;with family/visitor present Nurse Communication: Mobility status PT Visit Diagnosis: Difficulty in walking, not elsewhere classified (R26.2);Muscle weakness (generalized) (M62.81);History of falling (Z91.81)     Time: 1355-1430 PT Time Calculation (min) (ACUTE ONLY): 35 min  Charges:  $Gait Training: 8-22 mins                     West Carbo, PT, DPT   Acute Rehabilitation Department Pager #: [phone]   Sandra Cockayne 05/17/2021, 3:13 PM

## 2021-05-17 NOTE — Progress Notes (Signed)
Calorie Count Note: Day 2 Results  48-hour calorie count ordered. Calorie count started 11/15 at 1300. Please see day 2 results below.  Diet: Regular, thin liquids Supplements:  - Boost Breeze TID  Day 2: 11/16 Dinner: 340 kcal, 5 grams of protein 11/17 Breakfast: 290 kcal, 8 gram of protein 11/17 Lunch: 364 kcal, 18 grams of protein Snacks: 100 kcal, 2 grams of protein Supplements: 500 kcal, 18 grams of protein  Day 2 total 24-hour intake: 1594 kcal (89% of minimum estimated needs) 41 grams of protein (39% of minimum estimated needs)  Nutrition Diagnosis: Severe Malnutrition related to chronic illness (chronic N/V/D) as evidenced by severe muscle depletion, energy intake < or equal to 50% for > or equal to 1 month.  Goal: Patient will meet greater than or equal to 90% of their needs.  Intervention: - d/c 48-hour calorie count - Recommend weaning TPN tomorrow as pt is meeting 89% of kcal needs via PO route - Recommend holding off on Cortrak placement at this time - Continue Boost Breeze TID - Add ProSource Plus 30 ml po BID, each supplement provides 100 kcal and 15 grams of protein (this will add an additional 30 grams of protein daily) - Continue vitamin A 10,000 units x 14 days   Gustavus Bryant, MS, RD, LDN Inpatient Clinical Dietitian Please see AMiON for contact information.

## 2021-05-17 NOTE — Progress Notes (Signed)
Occupational Therapy Treatment Patient Details Name: Debbie Barry MRN: [id number] DOB: 1950/09/07 Today's Date: 05/17/2021   History of present illness Pt is a 70 y.o. female admitted from SNF on 05/02/21 with chest pain; found to have DVT, AKI, CHF, large bilateral pleural effusions. S/p L-side thoracentesis on 11/3. Pt suffered vfib/vtach arrest on 11/7 requiring 3-min CPR and shock.  Respiratory distress on 11/8 requiring non-rebreather. S/p R/LHC on 11/11. PMH includes spinal stenosis, chronic back pain, CHF, HTN, PVD, obesity; of note, recent admission 03/2021 for BLE weakness, found to have chronic L3-4 compression fx, severe L4-5 canal stenosis with plan for conservative management with lumbar brace.   OT comments  Pt readily willing to work with therapies, but needing encouragement to push herself to participate in ADL and ambulation. Continues to need +48mod assist for sit to stand and +1 with second person for safety with ambulation for short distance ambulation in her room. Pt declined going out of her room. Needing moderate assistance for pericare and to change soiled gown. Continue to recommend ST rehab in SNF, pt remains adamant she is going home.   Recommendations for follow up therapy are one component of a multi-disciplinary discharge planning process, led by the attending physician.  Recommendations may be updated based on patient status, additional functional criteria and insurance authorization.    Follow Up Recommendations  Skilled nursing-short term rehab (<3 hours/day) (pt is determined to return home)    Assistance Recommended at Discharge Frequent or constant Supervision/Assistance  Equipment Recommendations  None recommended by OT    Recommendations for Other Services      Precautions / Restrictions Precautions Precautions: Fall;Other (comment) Precaution Comments: bladder/bowel incontinence Required Braces or Orthoses: Spinal Brace Spinal Brace: Lumbar  corset Other Brace: Lumbar corset from prior admission (not in room) Restrictions Weight Bearing Restrictions: No       Mobility Bed Mobility Overal bed mobility: Needs Assistance Bed Mobility: Supine to Sit     Supine to sit: Min assist     General bed mobility comments: significantly increased time and verbal cues to complete. pt stopping movements repeatedly to await cues from therapist to continue. reports she is fatigued after each movement of her LE requirign a few moments of rest    Transfers Overall transfer level: Needs assistance Equipment used: Rolling walker (2 wheels) Transfers: Sit to/from Stand;Bed to chair/wheelchair/BSC Sit to Stand: +2 physical assistance;Mod assist Stand pivot transfers: +2 physical assistance;Min assist Step pivot transfers: Min assist;+2 safety/equipment       General transfer comment: pt completed x3 sit-stand during session and single pivoting transfer to [facility]. min-modA to power up from surface despite cues for UE placement and to improve use of LE     Balance Overall balance assessment: Needs assistance Sitting-balance support: No upper extremity supported;Feet unsupported Sitting balance-Leahy Scale: Poor Sitting balance - Comments: pt with significant trunk flexion, leaning R or L to prop on arms, unable to sit up without assist. Postural control: Right lateral lean;Left lateral lean Standing balance support: Bilateral upper extremity supported;During functional activity Standing balance-Leahy Scale: Poor Standing balance comment: can release one hand from walker to perform pericare, dependent on B UE support for ambulation                           ADL either performed or assessed with clinical judgement   ADL Overall ADL's : Needs assistance/impaired  Upper Body Dressing : Sitting;Moderate assistance Upper Body Dressing Details (indicate cue type and reason): decreased sequencing     Toilet  Transfer: +2 for physical assistance;Minimal assistance;Stand-pivot;Rolling walker (2 wheels)   Toileting- Clothing Manipulation and Hygiene: Maximal assistance;Sit to/from stand Toileting - Clothing Manipulation Details (indicate cue type and reason): assist for posterior pericare, pt performed front     Functional mobility during ADLs: Minimal assistance;Rolling walker (2 wheels);+2 for safety/equipment General ADL Comments: pt needing encouragement to put forth best effort with mobility    Extremity/Trunk Assessment              Vision       Perception     Praxis      Cognition Arousal/Alertness: Awake/alert Behavior During Therapy: Flat affect Overall Cognitive Status: No family/caregiver present to determine baseline cognitive functioning Area of Impairment: Attention;Memory;Following commands;Safety/judgement;Awareness;Problem solving                   Current Attention Level: Sustained Memory: Decreased short-term memory Following Commands: Follows one step commands inconsistently;Follows one step commands with increased time Safety/Judgement: Decreased awareness of safety;Decreased awareness of deficits Awareness: Intellectual Problem Solving: Slow processing;Decreased initiation;Difficulty sequencing;Requires verbal cues General Comments: decreased insight into level of care she requires to return home          Exercises     Shoulder Instructions       General Comments HR in 90s through session    Pertinent Vitals/ Pain       Pain Assessment: No/denies pain Pain Location: pt reports pain in back and buttocks after sitting in recliner  Home Living                                          Prior Functioning/Environment              Frequency  Min 2X/week        Progress Toward Goals  OT Goals(current goals can now be found in the care plan section)  Progress towards OT goals: Progressing toward goals  Acute  Rehab OT Goals OT Goal Formulation: With patient Time For Goal Achievement: 05/31/21 Potential to Achieve Goals: Good  Plan Discharge plan remains appropriate    Co-evaluation    PT/OT/SLP Co-Evaluation/Treatment: Yes Reason for Co-Treatment: For patient/therapist safety PT goals addressed during session: Mobility/safety with mobility;Balance;Proper use of DME;Strengthening/ROM OT goals addressed during session: ADL's and self-care      AM-PAC OT "6 Clicks" Daily Activity     Outcome Measure   Help from another person eating meals?: None Help from another person taking care of personal grooming?: A Little Help from another person toileting, which includes using toliet, bedpan, or urinal?: A Lot Help from another person bathing (including washing, rinsing, drying)?: A Lot Help from another person to put on and taking off regular upper body clothing?: A Lot Help from another person to put on and taking off regular lower body clothing?: Total 6 Click Score: 14    End of Session Equipment Utilized During Treatment: Gait belt;Rolling walker (2 wheels)  OT Visit Diagnosis: Unsteadiness on feet (R26.81);Other abnormalities of gait and mobility (R26.89);History of falling (Z91.81);Muscle weakness (generalized) (M62.81)   Activity Tolerance Patient tolerated treatment well   Patient Left in chair;with call bell/phone within reach;with chair alarm set   Nurse Communication Other (comment) (blood on washcloth with pericare)  Time: 2130-8657 OT Time Calculation (min): 28 min  Charges: OT General Charges $OT Visit: 1 Visit OT Treatments $Self Care/Home Management : 8-22 mins  Nestor Lewandowsky, OTR/L Acute Rehabilitation Services Pager: [phone] Office: [phone]   Malka So 05/17/2021, 4:14 PM

## 2021-05-17 NOTE — Progress Notes (Signed)
PHARMACY - TOTAL PARENTERAL NUTRITION CONSULT NOTE   Indication:  Intolerance to po intake, concern for malabsorption  Patient Measurements: Height: 5\' 1"  (154.9 cm) Weight: 52.9 kg (116 lb 10 oz) IBW/kg (Calculated) : 47.8 TPN AdjBW (KG): 52.3 Body mass index is 22.04 kg/m. Usual Weight: 60-65 kg  Assessment:  70 yo F admitted for DVT, acute on chronic CHF with anasarca. Patient had a cardiac arrest 11/9 likely due to electrolyte abnormalities. Patient's condition acutely worsened overnight 11/9 with concern for volume overload, N/V, and epigastric pain. Undigested/whole medications in bowel movements concerning for malabsorption. Noted limited po intake since Feb 2022.  Glucose / Insulin: no hx DM. CBGs <180, off insulin Electrolytes: Phos up to 5.9, CoCa 10.04, others wnl Renal:  Scr 0.54  (stable), BUN 25  Hepatic: LFTs / Tbili / TG WNL, albumin 1.7; on megestrol for appetite Intake / Output; MIVF: UOP documented 0.4 ml/kg/hr on furosemide 80mg  IV BID > changed to bumetanide 11/17; LBM 11/16 GI Imaging:11/3 CT abdomen: Mild ascites, marked diffuse body wall subQ edema GI Surgeries / Procedures: Vitamins: 11/8 Copper 80 (WNL), Ceruloplasmin 16.9 (low), Vitamin B1 149.5 (WNL), Vitamin D 87.7 (WNL), Vitamin B6 4.5 (WNL), Zinc 46 (WNL); on vitamin A PO 11/14- 11/27   Central access: PICC placed 11/7 TPN start date: 11/9  Nutritional Goals: Goal TPN rate 65 ml/hr (109gm AA, 1863 kcal)  RD Assessment: Estimated Needs Total Energy Estimated Needs: 1800-2000 Total Protein Estimated Needs: 105-120 Total Fluid Estimated Needs: >1.8 L/day  Current Nutrition:  TPN (weaned to 50% goal rate 11/13) 11/10 - Unable to tolerate feeding tube placement/attempted placement of postpyloric feeding tube (only advanced up to the stomach)> removed  11/10 - marked improvement in nausea, able to tolerate some PO 11/11 - poor appetite, did not eat much breakfast 11/12 Minimal PO intake due to  constipation, cholestyramine d/c'd and had BM 11/13 Feeling much better and appetite better per RN, ate ~75% of breakfast 11/14 per RN, patient picked at her breakfast; unknown how much lunch she ate; she ate a few bites of supper, vanilla Ensure x1, Boost x1  11/15 bites of english muffin, unknown for lunch;  had 2 peach boost, potatoes, 2 cran juice 11/16 Per RD: Breakfast: 133 kcal, 1 gram of protein; Lunch: 366 kcal, 9 grams of protein; Supplements: 700 kcal, 25 grams of protein    Day 1 total 24-hour intake:    1296 kcal (72% of minimum estimated needs)    37 grams of protein (35% of minimum estimated needs)    >> Downgrade diet to dysphagia 3 (mechanical soft) for ease of intake as pt does not have dentures; MD to reassess Cortrak placement on 11/18    Plan:  -Continue concentrated TPN @ 30 ml/hr at 1800 (provides 56g AA, 27g lipid, 117g dextrose, 883 kcals meeting ~50% of estimated needs)  -Electrolytes in TPN: Decrease Phos to 0 mmol/L; Continue Na 125 mEq/L, K 50 mEq/L, Mg 20 mEq/L, Ca to 0 mEq/L, Cl:Ac 2:1  -Remove standard MVI and trace elements from TPN, add PO multivitamin -Vitamin levels WNL - no additional replacement needed -Monitor TPN labs Mon/Thurs  -F/u diuretic plan -changed to bumetanide -F/u PO intake/calorie count, Cortrak plan, wean TPN as able  Thank you for involving pharmacy in this patient's care.  Benetta Spar, PharmD, BCPS, BCCP Clinical Pharmacist  Please check AMION for all [place] phone numbers After 10:00 PM, call [place] [phone]

## 2021-05-18 LAB — CBC
HCT: 27.2 % — ABNORMAL LOW (ref 36.0–46.0)
Hemoglobin: 9.2 g/dL — ABNORMAL LOW (ref 12.0–15.0)
MCH: 33.2 pg (ref 26.0–34.0)
MCHC: 33.8 g/dL (ref 30.0–36.0)
MCV: 98.2 fL (ref 80.0–100.0)
Platelets: 300 10*3/uL (ref 150–400)
RBC: 2.77 MIL/uL — ABNORMAL LOW (ref 3.87–5.11)
RDW: 18.2 % — ABNORMAL HIGH (ref 11.5–15.5)
WBC: 7.2 10*3/uL (ref 4.0–10.5)
nRBC: 0 % (ref 0.0–0.2)

## 2021-05-18 LAB — RENAL FUNCTION PANEL
Albumin: 2 g/dL — ABNORMAL LOW (ref 3.5–5.0)
Anion gap: 6 (ref 5–15)
BUN: 30 mg/dL — ABNORMAL HIGH (ref 8–23)
CO2: 24 mmol/L (ref 22–32)
Calcium: 8.7 mg/dL — ABNORMAL LOW (ref 8.9–10.3)
Chloride: 108 mmol/L (ref 98–111)
Creatinine, Ser: 0.52 mg/dL (ref 0.44–1.00)
GFR, Estimated: >60 mL/min (ref >60)
Glucose, Bld: 112 mg/dL — ABNORMAL HIGH (ref 70–99)
Phosphorus: 3.6 mg/dL (ref 2.5–4.6)
Potassium: 3.1 mmol/L — ABNORMAL LOW (ref 3.5–5.1)
Sodium: 138 mmol/L (ref 135–145)

## 2021-05-18 LAB — GLUCOSE, CAPILLARY
Glucose-Capillary: 118 mg/dL — ABNORMAL HIGH (ref 70–99)
Glucose-Capillary: 101 mg/dL — ABNORMAL HIGH (ref 70–99)

## 2021-05-18 LAB — BRAIN NATRIURETIC PEPTIDE: B Natriuretic Peptide: 431.3 pg/mL — ABNORMAL HIGH (ref 0.0–100.0)

## 2021-05-18 LAB — MAGNESIUM: Magnesium: 1.7 mg/dL (ref 1.7–2.4)

## 2021-05-18 MED ORDER — BUMETANIDE 1 MG PO TABS
1.0000 mg | ORAL_TABLET | Freq: Once | ORAL | Status: AC
  Administered 2021-05-18: 1 mg via ORAL
  Filled 2021-05-18: qty 1

## 2021-05-18 MED ORDER — POTASSIUM CHLORIDE CRYS ER 20 MEQ PO TBCR
40.0000 meq | EXTENDED_RELEASE_TABLET | ORAL | Status: AC
  Administered 2021-05-18 (×2): 40 meq via ORAL
  Filled 2021-05-18 (×2): qty 2

## 2021-05-18 MED ORDER — BUMETANIDE 2 MG PO TABS
2.0000 mg | ORAL_TABLET | Freq: Every day | ORAL | Status: DC
  Administered 2021-05-19: 2 mg via ORAL
  Filled 2021-05-18: qty 1

## 2021-05-18 MED ORDER — MAGNESIUM SULFATE 2 GM/50ML IV SOLN
2.0000 g | Freq: Once | INTRAVENOUS | Status: AC
  Administered 2021-05-18: 2 g via INTRAVENOUS
  Filled 2021-05-18: qty 50

## 2021-05-18 NOTE — Progress Notes (Signed)
PROGRESS NOTE  Debbie Barry IHK:[id number] DOB: Apr 13, 1951   PCP: Glendale Chard, MD  Patient is from: SNF.  Uses walker at baseline.  DOA: 05/02/2021 LOS: 46  Chief complaints:  Chief Complaint  Patient presents with   Chest Pain   Abdominal Pain     Brief Narrative / Interim history: 70 year old F with PMH of fatty liver, osteomyelitis, HTN, GERD, ambulatory dysfunction and a recent hospitalization for lower extremity weakness due to spinal stenosis for which medical management was recommended and she was discharged to SNF returning to ED with chest pain and palpitation, and found to have age-indeterminate RLE DVT, large bilateral pleural effusion, anasarca, acute right renal infarct comprising less than 10% of right renal cortex.  She was started on IV heparin.  Pleural effusion and anasarca was felt to be due to malnutrition/hypoalbuminemia.  She underwent right Thora with removal of 500 cc transudative culture negative fluid.  Cytology was negative as well.  Started on diuretics.   Patient went into V. fib cardiac arrest the morning of 11/7 with ROSC after 3 minutes of CPR prior to defibrillation.  Then, she went into pulsatile VT and had 1 DCCV.  She was transferred to ICU after cardiology consultation.  K and Mg were found to be low.  TTE with LVEF of 40 to 45% and RWMA on 11/7.Marland Kitchen  Repeat TTE on 11/9 with LVEF of 25 to 30%, GH, G1 DD and moderately reduced RV SF.  R/LHC on 11/11 with normal right heart pressures and without obstructive CAD suggesting NICM.  And also started on TPN on 11/9.    GI consulted for malnutrition.  Concern about protein-losing enteropathy. Extensive work-up significant for low vitamin A, transferrin and ceruloplasmin  but negative for C. difficile, GIP, Giardia, crypto, A1A, and celiac disease.  Diarrhea that has resolved with cholestyramine. intermittent.  TRH assumed care on 11/13.  Continues to require 50% TPN due to poor p.o. intake but seems to be  improving.  GI and cardiology following from [facility].  EP to place LifeVest prior to discharge.  Subjective: Seen and examined earlier this morning.  No major events overnight of this morning.  No complaints.  She denies chest pain, shortness of breath, GI or UTI symptoms.  She says she ate most of her breakfast.  Urine output was not great.  Weight is also up.  Objective: Vitals:   05/17/21 2308 05/18/21 0331 05/18/21 0734 05/18/21 1137  BP: (!) 102/50 (!) 112/58 130/67 114/72  Pulse: 77 74 80 80  Resp: 16 15 17 19   Temp: 99.1 F (37.3 C) 98.7 F (37.1 C) (!) 96.7 F (35.9 C) 98.3 F (36.8 C)  TempSrc: Oral Oral Axillary Oral  SpO2: 97% 98% 99% 99%  Weight:  53.8 kg    Height:        Intake/Output Summary (Last 24 hours) at 05/18/2021 1425 Last data filed at 05/18/2021 0740 Gross per 24 hour  Intake 935.31 ml  Output 500 ml  Net 435.31 ml   Filed Weights   05/16/21 0526 05/17/21 0415 05/18/21 0331  Weight: 51.8 kg 52.9 kg 53.8 kg    Examination:  GENERAL: Frail looking elderly female.  No apparent distress. HEENT: MMM.  Vision and hearing grossly intact.  NECK: Supple.  No apparent JVD.  RESP: 99% on RA.  No IWOB.  Fair aeration bilaterally. CVS:  RRR. Heart sounds normal.  ABD/GI/GU: BS+. Abd soft, NTND.  MSK/EXT:  Moves extremities. No apparent deformity.  Dependent edema  mostly in upper extremities SKIN: no apparent skin lesion or wound NEURO: Awake and alert. Oriented appropriately.  No apparent focal neuro deficit. PSYCH: Calm. Normal affect.   Procedures:  11/3-right thoracocentesis with removal of 500 cc 11/03-right Thora with removal of 500 cc transudative and culture negative fluid.  Cytology negative as well. 11/07-3 minutes of CPR for VF.  Pulsatile VT s/p DCCV about 2 hours later. 11/11-R/LHC with normal right heart pressures and nonobstructive CAD  Microbiology summarized: 11/3-COVID-19 and influenza PCR nonreactive. 11/3-AFB smear negative.  AFB  culture pending. 11/2-urine culture with Pseudomonas aeruginosa and Enterococcus faecalis 11/8-MRSA PCR, C. Difficile, GIP Giardia and crypto negative.  11/9-blood cultures NGTD. 11/15-urine culture pending.  Assessment & Plan: Ventricular fibrillation/torsades followed by VT/prolonged QT: Likely secondary to electrolyte derangements in the setting of aggressive diuresis.  ROSC after 3 minutes of CPR.  Had pulsatile VT after ROSC see requiring 1 DCCV.  TTE with LVEF of 40 to 45% and RWMA on 11/7.Marland Kitchen  Repeat TTE on 11/9 with LVEF of 25 to 30%, GH, G1-DD and moderately reduced RV SF.  R/LHC on 11/11 with normal right heart pressures and without obstructive CAD suggesting NICM. -Cardiology and EP following. -Per EP, not an ideal candidate for ICD implantation now.  Plan for LifeVest prior to discharge. -On nadolol per EP.  Remains in NSR.  Acute on chronic combined CHF: TTE and R/LHC as above.  She had bilateral pleural effusion and anasarca on presentation.  Fluid status improved with IV Lasix.  Transitioned to p.o. Bumex.  Only 500 cc UOP charted in the last 24 hours.  Net -8 L since admit.  Weight seems to be going up over the last 2 days. -Cardiology following from [facility].  Appears more edematous in upper extremities today. -Increase Bumex to 2 mg daily starting tomorrow.  We will give additional 1 mg this afternoon. -GDMT- losartan, Aldactone -Discussed with RN to monitor intake and output very closely -Will try standing weight if possible. -Regular diet due to poor p.o. intake  Anasarca: Multifactorial including CHF and severe malnutrition with severe hypoalbuminemia.  Concern for protein-losing nephropathy.  Extensive work-up by GI as above.  Improved. -Diuretics as above.   -Optimize nutrition-see below  Acute respiratory failure with hypoxia-in the setting of the above.  Resolved. -Manage CHF/fluid overload as above. -Encourage IS, OOB/PT/OT  Hyponatremia: Resolved.    Hypokalemia/hypomagnesemia/hypophosphatemia: K3.1.  Mg 1.7. -P.o. KCl 40x2 -IV magnesium sulfate 2 g x 1  Symptomatic normocytic anemia: Hgb 12 on admit>> 7.3>1u>> 9.  on presentation.  Hemoccult negative.  No iron deficiency.  Likely anemia of critical illness and malnutrition.  She has low transferrin suggesting poor GI absorption.  Recent Labs    05/11/21 1007 05/11/21 1012 05/12/21 0400 05/13/21 0455 05/14/21 0400 05/14/21 2300 05/15/21 0435 05/16/21 0200 05/17/21 0500 05/18/21 0433  HGB 9.5* 9.9*  9.9* 8.1* 7.3* 7.3* 8.9* 9.0* 8.4* 8.5* 9.2*  -Continue monitoring.  Spinal stenosis/lower extremity weakness/ambulatory dysfunction -Home health and DME ordered.  Right lower extremity DVT-age-indeterminate DVT extending all the way to the iliac vessels -On p.o. Eliquis  Acute right renal infarct-about 10% of right renal cortex.  From arrhythmia?  Shunt?  Renal function stable. -On p.o. Eliquis.  Renal function stable.  Lower back pain/pelvic pain/dysuria: Resolved.  UA with small LE but no nitrite or bacteria.  Previous urine culture with Pseudomonas and Enterococcus faecalis, likely colonization versus true infection. -P.o. Tylenol with as needed oxycodone for pain.   -Bowel regimen for  constipation-seems to be resolving.  Severe malnutrition as evidenced by anasarca and severe hypoalbuminemia.  P.o. intake seems to have picked up. -Continue regular diet -Plan to DC TPN today Body mass index is 22.41 kg/m. Nutrition Problem: Severe Malnutrition Etiology: chronic illness (chronic N/V/D) Signs/Symptoms: severe muscle depletion, energy intake < or equal to 50% for > or equal to 1 month Interventions: TPN-we will discontinue today   DVT prophylaxis:  Patient is on full anticoagulation for DVT.  Code Status: Full code Family Communication:   None at bedside today. Level of care: Telemetry Cardiac Status is: Inpatient  Remains inpatient appropriate because: Fluid  overload requiring adjustment to diuretics and close monitoring, electrolyte abnormalities    Consultants:  Cardiology Electrophysiology Pulmonology Gastroenterology   Sch Meds:  Scheduled Meds:  (feeding supplement) PROSource Plus  30 mL Oral BID BM   apixaban  10 mg Oral BID   Followed by   Derrill Memo ON 05/22/2021] apixaban  5 mg Oral BID   bumetanide  1 mg Oral Once   [START ON 05/19/2021] bumetanide  2 mg Oral Daily   Chlorhexidine Gluconate Cloth  6 each Topical Daily   feeding supplement  1 Container Oral TID BM   losartan  25 mg Oral Daily   mouth rinse  15 mL Mouth Rinse BID   megestrol  400 mg Oral BID   multivitamin with minerals  1 tablet Oral Daily   nadolol  20 mg Oral Daily   sodium chloride flush  10-40 mL Intracatheter Q12H   sodium chloride flush  3 mL Intravenous Q12H   spironolactone  25 mg Oral Daily   sucralfate  1 g Oral BID   thiamine  100 mg Oral Daily   vitamin A  10,000 Units Oral Daily   Continuous Infusions:  sodium chloride 10 mL/hr at 05/11/21 2100   TPN ADULT (ION) 30 mL/hr at 05/17/21 1717   PRN Meds:.sodium chloride, acetaminophen, bisacodyl, diphenhydrAMINE, docusate sodium, Gerhardt's butt cream, hydrOXYzine, melatonin, oxyCODONE, sodium chloride flush  Antimicrobials: Anti-infectives (From admission, onward)    None        I have personally reviewed the following labs and images: CBC: Recent Labs  Lab 05/12/21 0400 05/13/21 0455 05/14/21 0400 05/14/21 2300 05/15/21 0435 05/16/21 0200 05/17/21 0500 05/18/21 0433  WBC 7.7 7.2 7.6  --  7.2  --   --  7.2  HGB 8.1* 7.3* 7.3* 8.9* 9.0* 8.4* 8.5* 9.2*  HCT 22.9* 20.2* 21.5* 25.0* 25.6* 24.6* 24.3* 27.2*  MCV 94.2 93.5 97.3  --  93.8  --   --  98.2  PLT 200 194 249  --  269  --   --  300   BMP &GFR Recent Labs  Lab 05/13/21 0455 05/14/21 0400 05/14/21 0554 05/15/21 0435 05/17/21 0500 05/18/21 0433  NA 133* 132* 134* 136 136 138  K 3.8 6.2* 5.1 4.4 4.3 3.1*  CL 98 102  101 103 106 108  CO2 31 26 26 25 23 24   GLUCOSE 113* 446* 102* 115* 417* 112*  BUN 13 15 16 17  25* 30*  CREATININE 0.45 0.53 0.53 0.52 0.54 0.52  CALCIUM 7.7* 8.3* 8.6* 8.7* 7.8* 8.7*  MG 2.0 2.2 1.7 2.0 2.1 1.7  PHOS 2.2* 4.9* 3.7  --  5.9* 3.6   Estimated Creatinine Clearance: 51.5 mL/min (by C-G formula based on SCr of 0.52 mg/dL). Liver & Pancreas: Recent Labs  Lab 05/14/21 0400 05/14/21 0554 05/17/21 0500 05/18/21 0433  AST 31 31 23   --  ALT 21 19 18   --   ALKPHOS 111 125 108  --   BILITOT <0.1* 0.5 0.2*  --   PROT 4.2* 4.4* 4.5*  --   ALBUMIN 1.6* 1.7* 1.8* 2.0*   No results for input(s): LIPASE, AMYLASE in the last 168 hours. No results for input(s): AMMONIA in the last 168 hours. Diabetic: No results for input(s): HGBA1C in the last 72 hours. Recent Labs  Lab 05/17/21 0628 05/17/21 1149 05/17/21 1613 05/18/21 0617 05/18/21 1139  GLUCAP 117* 103* 123* 118* 101*   Cardiac Enzymes: No results for input(s): CKTOTAL, CKMB, CKMBINDEX, TROPONINI in the last 168 hours. No results for input(s): PROBNP in the last 8760 hours. Coagulation Profile: No results for input(s): INR, PROTIME in the last 168 hours. Thyroid Function Tests: No results for input(s): TSH, T4TOTAL, FREET4, T3FREE, THYROIDAB in the last 72 hours. Lipid Profile: No results for input(s): CHOL, HDL, LDLCALC, TRIG, CHOLHDL, LDLDIRECT in the last 72 hours.  Anemia Panel: No results for input(s): VITAMINB12, FOLATE, FERRITIN, TIBC, IRON, RETICCTPCT in the last 72 hours. Urine analysis:    Component Value Date/Time   COLORURINE YELLOW 05/15/2021 1300   APPEARANCEUR CLEAR 05/15/2021 1300   LABSPEC 1.008 05/15/2021 1300   PHURINE 5.0 05/15/2021 1300   GLUCOSEU NEGATIVE 05/15/2021 1300   HGBUR NEGATIVE 05/15/2021 1300   BILIRUBINUR NEGATIVE 05/15/2021 1300   BILIRUBINUR negative 03/30/2020 1215   KETONESUR NEGATIVE 05/15/2021 1300   PROTEINUR NEGATIVE 05/15/2021 1300   UROBILINOGEN 0.2 03/30/2020  1215   NITRITE NEGATIVE 05/15/2021 1300   LEUKOCYTESUR SMALL (A) 05/15/2021 1300   Sepsis Labs: Invalid input(s): PROCALCITONIN, [place]  Microbiology: Recent Results (from the past 240 hour(s))  Giardia/Cryptosporidium EIA     Status: None   Collection Time: 05/08/21  2:48 PM   Specimen: Per Rectum; Stool  Result Value Ref Range Status   Giardia Ag, Stl Negative Negative Final   Cryptosporidium EIA Negative Negative Final    Comment: (NOTE) Performed At: [facility] [place], [place] [id number] Rush Farmer MD VE:[id number]    Source of Sample STOOL  Final    Comment: Performed at [facility], [place] [address], [place], [place] [address]  C Difficile Quick Screen (NO PCR Reflex)     Status: None   Collection Time: 05/08/21  2:48 PM   Specimen: Per Rectum; Stool  Result Value Ref Range Status   C Diff antigen NEGATIVE NEGATIVE Final   C Diff toxin NEGATIVE NEGATIVE Final   C Diff interpretation No C. difficile detected.  Final    Comment: Performed at [facility], [place] [address], [place],  [address]  Gastrointestinal Panel by PCR , Stool     Status: None   Collection Time: 05/08/21  2:48 PM   Specimen: Per Rectum; Stool  Result Value Ref Range Status   Campylobacter species NOT DETECTED NOT DETECTED Final   Plesimonas shigelloides NOT DETECTED NOT DETECTED Final   Salmonella species NOT DETECTED NOT DETECTED Final   Yersinia enterocolitica NOT DETECTED NOT DETECTED Final   Vibrio species NOT DETECTED NOT DETECTED Final   Vibrio cholerae NOT DETECTED NOT DETECTED Final   Enteroaggregative E coli (EAEC) NOT DETECTED NOT DETECTED Final   Enteropathogenic E coli (EPEC) NOT DETECTED NOT DETECTED Final   Enterotoxigenic E coli (ETEC) NOT DETECTED NOT DETECTED Final   Shiga like toxin producing E coli (STEC) NOT DETECTED NOT DETECTED Final   Shigella/Enteroinvasive E coli (EIEC) NOT DETECTED NOT  DETECTED Final   Cryptosporidium NOT  DETECTED NOT DETECTED Final   Cyclospora cayetanensis NOT DETECTED NOT DETECTED Final   Entamoeba histolytica NOT DETECTED NOT DETECTED Final   Giardia lamblia NOT DETECTED NOT DETECTED Final   Adenovirus F40/41 NOT DETECTED NOT DETECTED Final   Astrovirus NOT DETECTED NOT DETECTED Final   Norovirus GI/GII NOT DETECTED NOT DETECTED Final   Rotavirus A NOT DETECTED NOT DETECTED Final   Sapovirus (I, II, IV, and V) NOT DETECTED NOT DETECTED Final    Comment: Performed at [facility], [address] [place], [place] [address]  Culture, blood (Routine X 2) w Reflex to ID Panel     Status: None   Collection Time: 05/09/21 10:53 AM   Specimen: BLOOD LEFT ARM  Result Value Ref Range Status   Specimen Description BLOOD LEFT ARM  Final   Special Requests   Final    BOTTLES DRAWN AEROBIC ONLY Blood Culture results may not be optimal due to an inadequate volume of blood received in culture bottles   Culture   Final    NO GROWTH 5 DAYS Performed at [facility], [address] [place], [place] [address]    Report Status 05/14/2021 FINAL  Final    Radiology Studies: No results found.   Raylen Tangonan T. [place]  If 7PM-7AM, please contact night-coverage [email or web address] 05/18/2021, 2:25 PM

## 2021-05-18 NOTE — Progress Notes (Signed)
PHARMACY - TOTAL PARENTERAL NUTRITION CONSULT NOTE   Indication:  Intolerance to po intake, concern for malabsorption  Patient Measurements: Height: 5\' 1"  (154.9 cm) Weight: 53.8 kg (118 lb 9.7 oz) IBW/kg (Calculated) : 47.8 TPN AdjBW (KG): 52.3 Body mass index is 22.41 kg/m. Usual Weight: 60-65 kg  Assessment:  70 yo F admitted for DVT, acute on chronic CHF with anasarca. Patient had a cardiac arrest 11/9 likely due to electrolyte abnormalities. Patient's condition acutely worsened overnight 11/9 with concern for volume overload, N/V, and epigastric pain. Undigested/whole medications in bowel movements concerning for malabsorption. Noted limited po intake since Feb 2022.  Glucose / Insulin: no hx DM. CBGs <180, off insulin Electrolytes: K 3.1>> Kcl PO 40 mEq x2 ordered, Mg 1.7>> 2g IV x1 ordered, CoCa 10.3, others wnl Renal:  Scr 0.52  (stable), BUN 30 Hepatic: LFTs / Tbili / TG WNL, albumin 1.7; on megestrol for appetite Intake / Output; MIVF: UOP documented 574ml  bumetanide; LBM 11/16 GI Imaging:11/3 CT abdomen: Mild ascites, marked diffuse body wall subQ edema GI Surgeries / Procedures: Vitamins: 11/8 Copper 80 (WNL), Ceruloplasmin 16.9 (low), Vitamin B1 149.5 (WNL), Vitamin D 87.7 (WNL), Vitamin B6 4.5 (WNL), Zinc 46 (WNL); on vitamin A PO 11/14- 11/27   Central access: PICC placed 11/7 TPN start date: 11/9  Nutritional Goals: Goal TPN rate 65 ml/hr (109gm AA, 1863 kcal)  RD Assessment: Estimated Needs Total Energy Estimated Needs: 1800-2000 Total Protein Estimated Needs: 105-120 Total Fluid Estimated Needs: >1.8 L/day  Current Nutrition:  TPN (weaned to 50% goal rate 11/13) 11/10 - Unable to tolerate feeding tube placement/attempted placement of postpyloric feeding tube (only advanced up to the stomach)> removed  11/10 - marked improvement in nausea, able to tolerate some PO 11/11 - poor appetite, did not eat much breakfast 11/12 Minimal PO intake due to constipation,  cholestyramine d/c'd and had BM 11/13 Feeling much better and appetite better per RN, ate ~75% of breakfast 11/14 per RN, patient picked at her breakfast; unknown how much lunch she ate; she ate a few bites of supper, vanilla Ensure x1, Boost x1  11/15 bites of english muffin, unknown for lunch;  had 2 peach boost, potatoes, 2 cran juice 11/16 Per RD: Breakfast: 133 kcal, 1 gram of protein; Lunch: 366 kcal, 9 grams of protein; Supplements: 700 kcal, 25 grams of protein    Day 1 total 24-hour intake:    1296 kcal (72% of minimum estimated needs)    37 grams of protein (35% of minimum estimated needs)    >> Downgrade diet to dysphagia 3 (mechanical soft) for ease of intake as pt does not have dentures    Plan:  Stop TPN per discussion with RD and MD Orders entered to taper to half rate at 1700    Thank you for involving pharmacy in this patient's care.  Benetta Spar, PharmD, BCPS, BCCP Clinical Pharmacist  Please check AMION for all [facility] phone numbers After 10:00 PM, call Door [phone]

## 2021-05-18 NOTE — Progress Notes (Signed)
[facility] Gastroenterology Progress Note  Debbie Barry 70 y.o. 1951/02/25  CC:  Malnutrition   Subjective: Patient states she is doing well today. Denies abdominal pain, nausea, vomiting. Has been having Bms without difficulty. States she has been eating well, recently had a grilled cheese this morning.   ROS : Review of Systems  Cardiovascular:  Negative for chest pain and palpitations.  Gastrointestinal:  Negative for abdominal pain, blood in stool, constipation, diarrhea, heartburn, melena, nausea and vomiting.     Objective: Vital signs in last 24 hours: Vitals:   05/18/21 0331 05/18/21 0734  BP: (!) 112/58 130/67  Pulse: 74 80  Resp: 15 17  Temp: 98.7 F (37.1 C) (!) 96.7 F (35.9 C)  SpO2: 98% 99%    Physical Exam:  General:  Alert, cooperative, no distress  Head:  Normocephalic, without obvious abnormality, atraumatic  Eyes:  Anicteric sclera, EOM's intact, conjunctival pallor  Lungs:   Clear to auscultation bilaterally, respirations unlabored  Heart:  Regular rate and rhythm, S1, S2 normal  Abdomen:   Soft, non-tender, bowel sounds active all four quadrants,  no masses,     Lab Results: Recent Labs    05/17/21 0500 05/18/21 0433  NA 136 138  K 4.3 3.1*  CL 106 108  CO2 23 24  GLUCOSE 417* 112*  BUN 25* 30*  CREATININE 0.54 0.52  CALCIUM 7.8* 8.7*  MG 2.1 1.7  PHOS 5.9* 3.6   Recent Labs    05/17/21 0500 05/18/21 0433  AST 23  --   ALT 18  --   ALKPHOS 108  --   BILITOT 0.2*  --   PROT 4.5*  --   ALBUMIN 1.8* 2.0*   Recent Labs    05/17/21 0500 05/18/21 0433  WBC  --  7.2  HGB 8.5* 9.2*  HCT 24.3* 27.2*  MCV  --  98.2  PLT  --  300   No results for input(s): LABPROT, INR in the last 72 hours.    Assessment Severe hypoalbuminemia, anasarca associated with nausea, vomiting, diarrhea and weight loss suspected to be secondary to protein-losing enteropathy - HGB 9.2 (improved from 7.3 11/14) - Potassium 3.1, Sodium 138 - BUN 30,  Cr 0.52 - Albumin 2.0 ( improved from 1.8 yesterday) - Magnesium 1.7 - Abdominal xray 11/14: prominent stool in rectum, mild gaseous distention  Negative Cdiff and GI panel Negative celiac, negative giardia, negative GI panel, negative Cdiff, normal alpha 1 antitrypsin and normal copper level.   Ventricular tachycardia, cardiac arrest, QT prolongation, systolic heart failure with EF of 25 to 30% Right and left cath - non obstructive CAD, nonischemic cardiomyopathy.  Acute hypoxic respiratory failure  Plan: Will need prolonged nutritional support with low-fat, high-protein, high medium change triglycerides containing diet to improve low albumin. Continue supportive care Patient is alert and oriented. Tolerating diet well and appears to be improving from GI standpoint. [facility] will follow at a distance.   Danna Sewell Radford Pax PA-C 05/18/2021, 10:51 AM  Contact #  [phone]

## 2021-05-19 LAB — RENAL FUNCTION PANEL
Albumin: 2.1 g/dL — ABNORMAL LOW (ref 3.5–5.0)
Anion gap: 4 — ABNORMAL LOW (ref 5–15)
BUN: 28 mg/dL — ABNORMAL HIGH (ref 8–23)
CO2: 23 mmol/L (ref 22–32)
Calcium: 8.6 mg/dL — ABNORMAL LOW (ref 8.9–10.3)
Chloride: 109 mmol/L (ref 98–111)
Creatinine, Ser: 0.51 mg/dL (ref 0.44–1.00)
GFR, Estimated: >60 mL/min (ref >60)
Glucose, Bld: 96 mg/dL (ref 70–99)
Phosphorus: 3.5 mg/dL (ref 2.5–4.6)
Potassium: 4.3 mmol/L (ref 3.5–5.1)
Sodium: 136 mmol/L (ref 135–145)

## 2021-05-19 LAB — GLUCOSE, CAPILLARY
Glucose-Capillary: 100 mg/dL — ABNORMAL HIGH (ref 70–99)
Glucose-Capillary: 96 mg/dL (ref 70–99)
Glucose-Capillary: 110 mg/dL — ABNORMAL HIGH (ref 70–99)

## 2021-05-19 LAB — HEMOGLOBIN AND HEMATOCRIT, BLOOD
HCT: 28.5 % — ABNORMAL LOW (ref 36.0–46.0)
Hemoglobin: 9.3 g/dL — ABNORMAL LOW (ref 12.0–15.0)

## 2021-05-19 LAB — MAGNESIUM: Magnesium: 1.7 mg/dL (ref 1.7–2.4)

## 2021-05-19 MED ORDER — MAGNESIUM SULFATE 2 GM/50ML IV SOLN
2.0000 g | Freq: Once | INTRAVENOUS | Status: AC
  Administered 2021-05-19: 2 g via INTRAVENOUS
  Filled 2021-05-19: qty 50

## 2021-05-19 MED ORDER — POLYETHYLENE GLYCOL 3350 17 G PO PACK: 17.0000 g | PACK | Freq: Two times a day (BID) | ORAL | Status: DC | PRN

## 2021-05-19 MED ORDER — TORSEMIDE 20 MG PO TABS
80.0000 mg | ORAL_TABLET | Freq: Two times a day (BID) | ORAL | Status: DC
  Administered 2021-05-19 – 2021-05-21 (×5): 80 mg via ORAL
  Filled 2021-05-19 (×5): qty 4

## 2021-05-19 MED ORDER — LOPERAMIDE HCL 2 MG PO CAPS: 2.0000 mg | ORAL_CAPSULE | ORAL | Status: DC | PRN

## 2021-05-19 NOTE — Plan of Care (Signed)
  Problem: Education: Goal: Knowledge of General Education information will improve Description: Including pain rating scale, medication(s)/side effects and non-pharmacologic comfort measures 05/19/2021 0121 by Shanon Ace, RN Outcome: Progressing 05/19/2021 0121 by Shanon Ace, RN Outcome: Progressing   Problem: Health Behavior/Discharge Planning: Goal: Ability to manage health-related needs will improve 05/19/2021 0121 by Shanon Ace, RN Outcome: Progressing 05/19/2021 0121 by Shanon Ace, RN Outcome: Progressing   Problem: Clinical Measurements: Goal: Ability to maintain clinical measurements within normal limits will improve 05/19/2021 0121 by Shanon Ace, RN Outcome: Progressing 05/19/2021 0121 by Shanon Ace, RN Outcome: Progressing Goal: Will remain free from infection 05/19/2021 0121 by Shanon Ace, RN Outcome: Progressing 05/19/2021 0121 by Shanon Ace, RN Outcome: Progressing Goal: Diagnostic test results will improve 05/19/2021 0121 by Shanon Ace, RN Outcome: Progressing 05/19/2021 0121 by Shanon Ace, RN Outcome: Progressing Goal: Respiratory complications will improve 05/19/2021 0121 by Shanon Ace, RN Outcome: Progressing 05/19/2021 0121 by Shanon Ace, RN Outcome: Progressing Goal: Cardiovascular complication will be avoided 05/19/2021 0121 by Shanon Ace, RN Outcome: Progressing 05/19/2021 0121 by Shanon Ace, RN Outcome: Progressing   Problem: Activity: Goal: Risk for activity intolerance will decrease 05/19/2021 0121 by Shanon Ace, RN Outcome: Progressing 05/19/2021 0121 by Shanon Ace, RN Outcome: Progressing   Problem: Nutrition: Goal: Adequate nutrition will be maintained 05/19/2021 0121 by Shanon Ace, RN Outcome: Progressing 05/19/2021 0121 by Shanon Ace, RN Outcome: Progressing   Problem: Coping: Goal: Level of anxiety will decrease 05/19/2021 0121 by Shanon Ace, RN Outcome:  Progressing 05/19/2021 0121 by Shanon Ace, RN Outcome: Progressing   Problem: Elimination: Goal: Will not experience complications related to bowel motility 05/19/2021 0121 by Shanon Ace, RN Outcome: Progressing 05/19/2021 0121 by Shanon Ace, RN Outcome: Progressing Goal: Will not experience complications related to urinary retention 05/19/2021 0121 by Shanon Ace, RN Outcome: Progressing 05/19/2021 0121 by Shanon Ace, RN Outcome: Progressing   Problem: Pain Managment: Goal: General experience of comfort will improve 05/19/2021 0121 by Shanon Ace, RN Outcome: Progressing 05/19/2021 0121 by Shanon Ace, RN Outcome: Progressing   Problem: Safety: Goal: Ability to remain free from injury will improve 05/19/2021 0121 by Shanon Ace, RN Outcome: Progressing 05/19/2021 0121 by Shanon Ace, RN Outcome: Progressing   Problem: Skin Integrity: Goal: Risk for impaired skin integrity will decrease 05/19/2021 0121 by Shanon Ace, RN Outcome: Progressing 05/19/2021 0121 by Shanon Ace, RN Outcome: Progressing   Problem: Education: Goal: Ability to demonstrate management of disease process will improve 05/19/2021 0121 by Shanon Ace, RN Outcome: Progressing 05/19/2021 0121 by Shanon Ace, RN Outcome: Progressing Goal: Ability to verbalize understanding of medication therapies will improve 05/19/2021 0121 by Shanon Ace, RN Outcome: Progressing 05/19/2021 0121 by Shanon Ace, RN Outcome: Progressing   Problem: Activity: Goal: Capacity to carry out activities will improve 05/19/2021 0121 by Shanon Ace, RN Outcome: Progressing 05/19/2021 0121 by Shanon Ace, RN Outcome: Progressing   Problem: Cardiac: Goal: Ability to achieve and maintain adequate cardiopulmonary perfusion will improve 05/19/2021 0121 by Shanon Ace, RN Outcome: Progressing 05/19/2021 0121 by Shanon Ace, RN Outcome: Progressing

## 2021-05-19 NOTE — Progress Notes (Addendum)
PROGRESS NOTE  Debbie Barry JKD:[id number] DOB: 1951/01/02   PCP: Glendale Chard, MD  Patient is from: SNF.  Uses walker at baseline.  DOA: 05/02/2021 LOS: 89  Chief complaints:  Chief Complaint  Patient presents with   Chest Pain   Abdominal Pain     Brief Narrative / Interim history: 70 year old F with PMH of fatty liver, osteomyelitis, HTN, GERD, ambulatory dysfunction and a recent hospitalization for lower extremity weakness due to spinal stenosis for which medical management was recommended and she was discharged to SNF returning to ED with chest pain and palpitation, and found to have age-indeterminate RLE DVT, large bilateral pleural effusion, anasarca, acute right renal infarct comprising less than 10% of right renal cortex.  She was started on IV heparin.  Pleural effusion and anasarca was felt to be due to malnutrition/hypoalbuminemia.  She underwent right Thora with removal of 500 cc transudative culture negative fluid.  Cytology was negative as well.  Started on diuretics.   Patient went into V. fib cardiac arrest the morning of 11/7 with ROSC after 3 minutes of CPR prior to defibrillation.  Then, she went into pulsatile VT and had 1 DCCV.  She was transferred to ICU after cardiology consultation.  K and Mg were found to be low.  TTE with LVEF of 40 to 45% and RWMA on 11/7.Marland Kitchen  Repeat TTE on 11/9 with LVEF of 25 to 30%, GH, G1 DD and moderately reduced RV SF.  R/LHC on 11/11 with normal right heart pressures and without obstructive CAD suggesting NICM.  And also started on TPN on 11/9.    GI consulted for malnutrition.  Concern about protein-losing enteropathy. Extensive work-up significant for low vitamin A, transferrin and ceruloplasmin  but negative for C. difficile, GIP, Giardia, crypto, A1A, and celiac disease.  Diarrhea that has resolved with cholestyramine. intermittent.  TRH assumed care on 11/13.  P.o. intake improved.  She came off TPN on 11/18. Transitioned off IV  Lasix.  Uptitrating Bumex for good urine output. GI and cardiology following from [place].  EP to place LifeVest prior to discharge.  Subjective: Seen and examined earlier this morning.  No major events overnight of this morning.  No complaints.  She denies chest pain, dyspnea, GI or UTI symptoms.  I&O incomplete.  Did not have good urine output either.  Weight is uptrending.  Objective: Vitals:   05/18/21 2308 05/19/21 0433 05/19/21 0727 05/19/21 1115  BP: (!) 103/51 114/64 125/70 121/84  Pulse: 84 81 80 88  Resp: 16 16 14 15   Temp: 98.9 F (37.2 C) 98.3 F (36.8 C) 97.8 F (36.6 C) 98.3 F (36.8 C)  TempSrc: Oral Oral Oral Oral  SpO2: 98% 96% 96% 95%  Weight:  54.5 kg    Height:        Intake/Output Summary (Last 24 hours) at 05/19/2021 1127 Last data filed at 05/19/2021 0434 Gross per 24 hour  Intake --  Output 450 ml  Net -450 ml   Filed Weights   05/17/21 0415 05/18/21 0331 05/19/21 0433  Weight: 52.9 kg 53.8 kg 54.5 kg    Examination:  GENERAL: Frail looking elderly female.  Nontoxic. HEENT: MMM.  Vision and hearing grossly intact.  NECK: Supple.  No apparent JVD.  RESP: 95% on RA.  No IWOB.  Fair aeration bilaterally. CVS:  RRR. Heart sounds normal.  ABD/GI/GU: BS+. Abd soft, NTND.  MSK/EXT:  Moves extremities. No apparent deformity.  BUE and BLE edema SKIN: no apparent skin lesion  or wound NEURO: Awake and alert. Oriented appropriately.  No apparent focal neuro deficit. PSYCH: Calm. Normal affect.   Procedures:  11/3-right thoracocentesis with removal of 500 cc 11/03-right Thora with removal of 500 cc transudative and culture negative fluid.  Cytology negative as well. 11/07-3 minutes of CPR for VF.  Pulsatile VT s/p DCCV about 2 hours later. 11/11-R/LHC with normal right heart pressures and nonobstructive CAD  Microbiology summarized: 11/3-COVID-19 and influenza PCR nonreactive. 11/3-AFB smear negative.  AFB culture pending. 11/2-urine culture with  Pseudomonas aeruginosa and Enterococcus faecalis 11/8-MRSA PCR, C. Difficile, GIP Giardia and crypto negative.  11/9-blood cultures NGTD. 11/15-urine culture pending.  Assessment & Plan: Ventricular fibrillation/torsades followed by VT/prolonged QT: Likely secondary to electrolyte derangements in the setting of aggressive diuresis.  ROSC after 3 minutes of CPR.  Had pulsatile VT after ROSC see requiring 1 DCCV.  TTE with LVEF of 40 to 45% and RWMA on 11/7.Marland Kitchen  Repeat TTE on 11/9 with LVEF of 25 to 30%, GH, G1-DD and moderately reduced RV SF.  R/LHC on 11/11 with normal right heart pressures and without obstructive CAD suggesting NICM. -On nadolol per EP.  Remains in NSR. -Per EP, not an ideal candidate for ICD implantation now.  Notify EP prior to discharge for LifeVest   Acute on chronic combined CHF: TTE and R/LHC as above.  She had bilateral pleural effusion and anasarca on presentation.  Fluid status improved with IV Lasix.  Transitioned to p.o. Bumex.  Only 500 cc UOP charted in the last 24 hours.  Net -8 L since admit.  Weight seems to be going up.  Seems to have increased edema as well. -Cardiology following from [place].  -Increased Bumex to 2 mg daily.  Will redose this afternoon based on urine output.  -May have to consider torsemide or go back on IV Lasix if urine output remains low. -GDMT- losartan, Aldactone -Discussed with RN to monitor intake and output very closely -Will try standing weight if possible. -Regular diet due to poor p.o. intake  Addendum RN reported only 400 cc urine output after 2 mg of Bumex. -We will try torsemide 80 mg twice daily starting this evening.  She might have poor GI absorption. -Closely monitor urine output  Anasarca: Multifactorial including CHF and severe malnutrition with severe hypoalbuminemia.  Concern for protein-losing nephropathy.  Extensive work-up by GI as above.  Increasing edema after initial improvement. -Diuretics as above.   -Optimize  nutrition-see below  Acute respiratory failure with hypoxia-in the setting of the above.  Resolved. -Manage CHF/fluid overload as above. -Encourage IS, OOB/PT/OT  Hyponatremia: Resolved.   Hypokalemia/hypomagnesemia/hypophosphatemia: Resolved.  Mg 1.7. -IV magnesium sulfate 2 g x 1  Symptomatic normocytic anemia: Hgb 12 on admit>> 7.3>1u>> 9.  on presentation.  Hemoccult negative.  No iron deficiency.  Likely anemia of critical illness and malnutrition.  She has low transferrin suggesting poor GI absorption.  Recent Labs    05/11/21 1012 05/12/21 0400 05/13/21 0455 05/14/21 0400 05/14/21 2300 05/15/21 0435 05/16/21 0200 05/17/21 0500 05/18/21 0433 05/19/21 0451  HGB 9.9*  9.9* 8.1* 7.3* 7.3* 8.9* 9.0* 8.4* 8.5* 9.2* 9.3*  -Continue monitoring.  Spinal stenosis/lower extremity weakness/ambulatory dysfunction -Home health and DME ordered.  Right lower extremity DVT-age-indeterminate DVT extending all the way to the iliac vessels -On p.o. Eliquis  Acute right renal infarct-about 10% of right renal cortex.  From arrhythmia?  Shunt?  Renal function stable. -On p.o. Eliquis.  Renal function stable.  Lower back pain/pelvic pain/dysuria: Resolved.  UA with small LE but no nitrite or bacteria.  Previous urine culture with Pseudomonas and Enterococcus faecalis, likely colonization versus true infection. -P.o. Tylenol with as needed oxycodone for pain.   -Bowel regimen for constipation-seems to be resolving.  Severe malnutrition as evidenced by anasarca and severe hypoalbuminemia.  P.o. intake seems to have picked up. -Continue regular diet -Plan to DC TPN today Body mass index is 22.7 kg/m. Nutrition Problem: Severe Malnutrition Etiology: chronic illness (chronic N/V/D) Signs/Symptoms: severe muscle depletion, energy intake < or equal to 50% for > or equal to 1 month Interventions: TPN-we will discontinue today   DVT prophylaxis:  Patient is on full anticoagulation for  DVT.  Code Status: Full code Family Communication:   None at bedside today. Level of care: Telemetry Cardiac Status is: Inpatient  Remains inpatient appropriate because: Fluid overload requiring adjustment to diuretics and close monitoring, electrolyte abnormalities    Consultants:  Cardiology Electrophysiology Pulmonology Gastroenterology   Sch Meds:  Scheduled Meds:  (feeding supplement) PROSource Plus  30 mL Oral BID BM   apixaban  10 mg Oral BID   Followed by   Derrill Memo ON 05/22/2021] apixaban  5 mg Oral BID   bumetanide  2 mg Oral Daily   Chlorhexidine Gluconate Cloth  6 each Topical Daily   feeding supplement  1 Container Oral TID BM   losartan  25 mg Oral Daily   mouth rinse  15 mL Mouth Rinse BID   megestrol  400 mg Oral BID   multivitamin with minerals  1 tablet Oral Daily   nadolol  20 mg Oral Daily   sodium chloride flush  10-40 mL Intracatheter Q12H   sodium chloride flush  3 mL Intravenous Q12H   spironolactone  25 mg Oral Daily   sucralfate  1 g Oral BID   thiamine  100 mg Oral Daily   vitamin A  10,000 Units Oral Daily   Continuous Infusions:  sodium chloride 10 mL/hr at 05/11/21 2100   PRN Meds:.sodium chloride, acetaminophen, bisacodyl, diphenhydrAMINE, docusate sodium, Gerhardt's butt cream, hydrOXYzine, melatonin, oxyCODONE, sodium chloride flush  Antimicrobials: Anti-infectives (From admission, onward)    None        I have personally reviewed the following labs and images: CBC: Recent Labs  Lab 05/13/21 0455 05/14/21 0400 05/14/21 2300 05/15/21 0435 05/16/21 0200 05/17/21 0500 05/18/21 0433 05/19/21 0451  WBC 7.2 7.6  --  7.2  --   --  7.2  --   HGB 7.3* 7.3*   < > 9.0* 8.4* 8.5* 9.2* 9.3*  HCT 20.2* 21.5*   < > 25.6* 24.6* 24.3* 27.2* 28.5*  MCV 93.5 97.3  --  93.8  --   --  98.2  --   PLT 194 249  --  269  --   --  300  --    < > = values in this interval not displayed.   BMP &GFR Recent Labs  Lab 05/14/21 0400  05/14/21 0554 05/15/21 0435 05/17/21 0500 05/18/21 0433 05/19/21 0451  NA 132* 134* 136 136 138 136  K 6.2* 5.1 4.4 4.3 3.1* 4.3  CL 102 101 103 106 108 109  CO2 26 26 25 23 24 23   GLUCOSE 446* 102* 115* 417* 112* 96  BUN 15 16 17  25* 30* 28*  CREATININE 0.53 0.53 0.52 0.54 0.52 0.51  CALCIUM 8.3* 8.6* 8.7* 7.8* 8.7* 8.6*  MG 2.2 1.7 2.0 2.1 1.7 1.7  PHOS 4.9* 3.7  --  5.9* 3.6  3.5   Estimated Creatinine Clearance: 51.5 mL/min (by C-G formula based on SCr of 0.51 mg/dL). Liver & Pancreas: Recent Labs  Lab 05/14/21 0400 05/14/21 0554 05/17/21 0500 05/18/21 0433 05/19/21 0451  AST 31 31 23   --   --   ALT 21 19 18   --   --   ALKPHOS 111 125 108  --   --   BILITOT <0.1* 0.5 0.2*  --   --   PROT 4.2* 4.4* 4.5*  --   --   ALBUMIN 1.6* 1.7* 1.8* 2.0* 2.1*   No results for input(s): LIPASE, AMYLASE in the last 168 hours. No results for input(s): AMMONIA in the last 168 hours. Diabetic: No results for input(s): HGBA1C in the last 72 hours. Recent Labs  Lab 05/17/21 1613 05/18/21 0617 05/18/21 1139 05/18/21 1529 05/19/21 0624  GLUCAP 123* 118* 101* 100* 96   Cardiac Enzymes: No results for input(s): CKTOTAL, CKMB, CKMBINDEX, TROPONINI in the last 168 hours. No results for input(s): PROBNP in the last 8760 hours. Coagulation Profile: No results for input(s): INR, PROTIME in the last 168 hours. Thyroid Function Tests: No results for input(s): TSH, T4TOTAL, FREET4, T3FREE, THYROIDAB in the last 72 hours. Lipid Profile: No results for input(s): CHOL, HDL, LDLCALC, TRIG, CHOLHDL, LDLDIRECT in the last 72 hours.  Anemia Panel: No results for input(s): VITAMINB12, FOLATE, FERRITIN, TIBC, IRON, RETICCTPCT in the last 72 hours. Urine analysis:    Component Value Date/Time   COLORURINE YELLOW 05/15/2021 1300   APPEARANCEUR CLEAR 05/15/2021 1300   LABSPEC 1.008 05/15/2021 1300   PHURINE 5.0 05/15/2021 1300   GLUCOSEU NEGATIVE 05/15/2021 1300   HGBUR NEGATIVE 05/15/2021  1300   BILIRUBINUR NEGATIVE 05/15/2021 1300   BILIRUBINUR negative 03/30/2020 1215   KETONESUR NEGATIVE 05/15/2021 1300   PROTEINUR NEGATIVE 05/15/2021 1300   UROBILINOGEN 0.2 03/30/2020 1215   NITRITE NEGATIVE 05/15/2021 1300   LEUKOCYTESUR SMALL (A) 05/15/2021 1300   Sepsis Labs: Invalid input(s): PROCALCITONIN, [place]  Microbiology: No results found for this or any previous visit (from the past 240 hour(s)).   Radiology Studies: No results found.   Debbie Barry T. [place]  If 7PM-7AM, please contact night-coverage [email or web address] 05/19/2021, 11:27 AM

## 2021-05-19 NOTE — Plan of Care (Signed)
  Problem: Education: Goal: Knowledge of General Education information will improve Description: Including pain rating scale, medication(s)/side effects and non-pharmacologic comfort measures Outcome: Progressing   Problem: Education: Goal: Knowledge of General Education information will improve Description: Including pain rating scale, medication(s)/side effects and non-pharmacologic comfort measures Outcome: Progressing   Problem: Health Behavior/Discharge Planning: Goal: Ability to manage health-related needs will improve Outcome: Progressing   Problem: Clinical Measurements: Goal: Ability to maintain clinical measurements within normal limits will improve Outcome: Progressing Goal: Will remain free from infection Outcome: Progressing Goal: Diagnostic test results will improve Outcome: Progressing Goal: Respiratory complications will improve Outcome: Progressing Goal: Cardiovascular complication will be avoided Outcome: Progressing   Problem: Activity: Goal: Risk for activity intolerance will decrease Outcome: Progressing   Problem: Nutrition: Goal: Adequate nutrition will be maintained Outcome: Progressing   Problem: Coping: Goal: Level of anxiety will decrease Outcome: Progressing   Problem: Elimination: Goal: Will not experience complications related to bowel motility Outcome: Progressing Goal: Will not experience complications related to urinary retention Outcome: Progressing   Problem: Pain Managment: Goal: General experience of comfort will improve Outcome: Progressing   Problem: Safety: Goal: Ability to remain free from injury will improve Outcome: Progressing   Problem: Skin Integrity: Goal: Risk for impaired skin integrity will decrease Outcome: Progressing   Problem: Education: Goal: Ability to demonstrate management of disease process will improve Outcome: Progressing Goal: Ability to verbalize understanding of medication therapies will  improve Outcome: Progressing   Problem: Activity: Goal: Capacity to carry out activities will improve Outcome: Progressing   Problem: Cardiac: Goal: Ability to achieve and maintain adequate cardiopulmonary perfusion will improve Outcome: Progressing

## 2021-05-20 LAB — RENAL FUNCTION PANEL
Albumin: 2.1 g/dL — ABNORMAL LOW (ref 3.5–5.0)
Anion gap: 7 (ref 5–15)
BUN: 24 mg/dL — ABNORMAL HIGH (ref 8–23)
CO2: 25 mmol/L (ref 22–32)
Calcium: 8.4 mg/dL — ABNORMAL LOW (ref 8.9–10.3)
Chloride: 106 mmol/L (ref 98–111)
Creatinine, Ser: 0.57 mg/dL (ref 0.44–1.00)
GFR, Estimated: >60 mL/min (ref >60)
Glucose, Bld: 95 mg/dL (ref 70–99)
Phosphorus: 4.1 mg/dL (ref 2.5–4.6)
Potassium: 3.3 mmol/L — ABNORMAL LOW (ref 3.5–5.1)
Sodium: 138 mmol/L (ref 135–145)

## 2021-05-20 LAB — HEMOGLOBIN AND HEMATOCRIT, BLOOD
HCT: 27.4 % — ABNORMAL LOW (ref 36.0–46.0)
Hemoglobin: 9.3 g/dL — ABNORMAL LOW (ref 12.0–15.0)

## 2021-05-20 LAB — GLUCOSE, CAPILLARY
Glucose-Capillary: 99 mg/dL (ref 70–99)
Glucose-Capillary: 112 mg/dL — ABNORMAL HIGH (ref 70–99)

## 2021-05-20 LAB — MAGNESIUM: Magnesium: 1.4 mg/dL — ABNORMAL LOW (ref 1.7–2.4)

## 2021-05-20 MED ORDER — POTASSIUM CHLORIDE CRYS ER 20 MEQ PO TBCR
40.0000 meq | EXTENDED_RELEASE_TABLET | Freq: Two times a day (BID) | ORAL | Status: AC
  Administered 2021-05-20 (×2): 40 meq via ORAL
  Filled 2021-05-20 (×2): qty 2

## 2021-05-20 MED ORDER — MAGNESIUM SULFATE 2 GM/50ML IV SOLN
2.0000 g | Freq: Once | INTRAVENOUS | Status: AC
  Administered 2021-05-20: 2 g via INTRAVENOUS
  Filled 2021-05-20: qty 50

## 2021-05-20 NOTE — Plan of Care (Signed)

## 2021-05-20 NOTE — Progress Notes (Signed)
Mobility Specialist: Progress Note   05/20/21 1718  Mobility  Activity Transferred:  Chair to bed  Level of Assistance +2 (takes two people)  Assistive Device  (HHA)  Distance Ambulated (ft) 2 ft  Mobility Out of bed to chair with meals  Mobility Response Tolerated well  Mobility performed by Mobility specialist;Nurse  $Mobility charge 1 Mobility   Pt assisted back to bed per RN request. Pt had no c/o during transfer, call bell at her side.   [facility] Ishanvi Mcquitty Mobility Specialist Mobility Specialist Phone #1: [phone] Mobility Specialist Phone #2: [phone]

## 2021-05-20 NOTE — Progress Notes (Signed)
PROGRESS NOTE    Debbie Barry  JIR:[id number] DOB: 1951-01-18 DOA: 05/02/2021 PCP: Glendale Chard, MD     Brief Narrative:  Debbie Barry is a 70 year old F with PMH of fatty liver, osteomyelitis, HTN, GERD, ambulatory dysfunction and a recent hospitalization for lower extremity weakness due to spinal stenosis for which medical management was recommended and she was discharged to SNF. She returned to ED with chest pain and palpitation, and found to have age-indeterminate RLE DVT, large bilateral pleural effusion, anasarca, acute right renal infarct comprising less than 10% of right renal cortex.  She was started on IV heparin.  Pleural effusion and anasarca was felt to be due to malnutrition/hypoalbuminemia.  She underwent right thoracentesis with removal of 500 cc transudative culture negative fluid.  Cytology was negative as well.  Started on diuretics.    Patient went into V. fib cardiac arrest the morning of 11/7 with ROSC after 3 minutes of CPR prior to defibrillation.  Then she went into pulsatile VT and had 1 DCCV.  She was transferred to ICU after cardiology consultation.  K and Mg were found to be low.  TTE with LVEF of 40 to 45% and RWMA on 11/7.  Repeat TTE on 11/9 with LVEF of 25 to 30%, GH, G1 DD and moderately reduced RV SF.  R/LHC on 11/11 with normal right heart pressures and without obstructive CAD suggesting NICM.  And also started on TPN on 11/9.  GI consulted for malnutrition.  Concern about protein-losing enteropathy. Extensive work-up significant for low vitamin A, transferrin and ceruloplasmin but negative for C. difficile, GIP, Giardia, crypto, A1A, and celiac disease.  Diarrhea that has resolved with cholestyramine. TRH assumed care on 11/13.   P.o. intake improved.  She came off TPN on 11/18. Transitioned off IV Lasix.  Uptitrating Bumex for good urine output. GI and cardiology following from [place].  EP to place LifeVest prior to discharge.  New events last 24 hours  / Subjective: Patient laying in bed, in good spirits this morning.  Denies any chest pain or shortness of breath.  She remained stable on room air.  States that her edema has improved.  Has been urinating overnight although in and out is measured inaccurately.  Assessment & Plan:   Principal Problem:   DVT (deep venous thrombosis) (HCC) Active Problems:   Depression   Protein-calorie malnutrition, severe   Prolonged QT interval   Renal infarct (HCC)   Hyponatremia   Chronic retention of urine   Lumbar spinal stenosis   Anasarca   Bilateral pleural effusion   Cardiac arrest (HCC)   Cardiomyopathy (HCC)   Nonischemic cardiomyopathy (HCC)   Ventricular fibrillation/torsades followed by VT/prolonged QT: Likely secondary to electrolyte derangements in the setting of aggressive diuresis.  ROSC after 3 minutes of CPR.  Had pulsatile VT after ROSC see requiring 1 DCCV.  TTE with LVEF of 40 to 45% and RWMA on 11/7. Repeat TTE on 11/9 with LVEF of 25 to 30%, GH, G1-DD and moderately reduced RV SF.  R/LHC on 11/11 with normal right heart pressures and without obstructive CAD suggesting NICM. -On nadolol per EP.  Remains in NSR. -Per EP, not an ideal candidate for ICD implantation now.  Notify EP prior to discharge for LifeVest   Acute on chronic combined CHF: TTE and R/LHC as above.  She had bilateral pleural effusion and anasarca on presentation.  Fluid status improved with IV Lasix.  Transitioned to p.o. Bumex. Cardiology following from [place].  -Continue  torsemide, losartan, Aldactone   Anasarca: Multifactorial including CHF and severe malnutrition with severe hypoalbuminemia.  Concern for protein-losing nephropathy.  Extensive work-up by GI as above.  Increasing edema after initial improvement. -Diuretics as above.   -Optimize nutrition   Acute respiratory failure with hypoxia.  Resolved.  On room air today    Symptomatic normocytic anemia: Likely anemia of critical illness and  malnutrition.  She has low transferrin suggesting poor GI absorption.    Spinal stenosis/lower extremity weakness/ambulatory dysfunction -Home health and DME ordered   Right lower extremity DVT. Age-indeterminate DVT extending all the way to the iliac vessels.  Continue Eliquis   Acute right renal infarct. About 10% of right renal cortex.  From arrhythmia?  Shunt?  Renal function stable.  Continue Eliquis   Lower back pain/pelvic pain/dysuria: Resolved.  UA with small LE but no nitrite or bacteria.  Previous urine culture with Pseudomonas and Enterococcus faecalis, likely colonization versus true infection.   Severe malnutrition as evidenced by anasarca and severe hypoalbuminemia.   -Off TPN now  Hypokalemia, hypomagnesemia -Replace, trend   DVT prophylaxis: Eliquis Code Status: Full code Family Communication: No family at bedside Disposition Plan:  Status is: Inpatient  Remains inpatient appropriate because: Remains on diuresis, patient refusing SNF and will go home with home health.  Potassium and magnesium are being replaced today.   Antimicrobials:  Anti-infectives (From admission, onward)    None        Objective: Vitals:   05/19/21 2223 05/20/21 0427 05/20/21 0500 05/20/21 0726  BP: (!) 129/98 126/68  116/70  Pulse: 83 76  75  Resp: 16 12  17   Temp: 99 F (37.2 C) 98.4 F (36.9 C)  98.2 F (36.8 C)  TempSrc: Oral Oral  Oral  SpO2: 96% 95%  93%  Weight:   54.1 kg   Height:        Intake/Output Summary (Last 24 hours) at 05/20/2021 1114 Last data filed at 05/20/2021 0930 Gross per 24 hour  Intake 374 ml  Output 900 ml  Net -526 ml   Filed Weights   05/18/21 0331 05/19/21 0433 05/20/21 0500  Weight: 53.8 kg 54.5 kg 54.1 kg    Examination:  General exam: Appears calm and comfortable  Respiratory system: Clear to auscultation. Respiratory effort normal. No respiratory distress. No conversational dyspnea.  On room air Cardiovascular system: S1 & S2  heard, RRR. No murmurs. No pedal edema. Gastrointestinal system: Abdomen is nondistended, soft and nontender. Normal bowel sounds heard. Central nervous system: Alert and oriented. No focal neurological deficits. Speech clear.  Extremities: Symmetric in appearance  Skin: No rashes, lesions or ulcers on exposed skin  Psychiatry: Judgement and insight appear normal. Mood & affect appropriate.   Data Reviewed: I have personally reviewed following labs and imaging studies  CBC: Recent Labs  Lab 05/14/21 0400 05/14/21 2300 05/15/21 0435 05/16/21 0200 05/17/21 0500 05/18/21 0433 05/19/21 0451 05/20/21 0500  WBC 7.6  --  7.2  --   --  7.2  --   --   HGB 7.3*   < > 9.0* 8.4* 8.5* 9.2* 9.3* 9.3*  HCT 21.5*   < > 25.6* 24.6* 24.3* 27.2* 28.5* 27.4*  MCV 97.3  --  93.8  --   --  98.2  --   --   PLT 249  --  269  --   --  300  --   --    < > = values in this interval not displayed.  Basic Metabolic Panel: Recent Labs  Lab 05/14/21 0554 05/15/21 0435 05/17/21 0500 05/18/21 0433 05/19/21 0451 05/20/21 0500  NA 134* 136 136 138 136 138  K 5.1 4.4 4.3 3.1* 4.3 3.3*  CL 101 103 106 108 109 106  CO2 26 25 23 24 23 25   GLUCOSE 102* 115* 417* 112* 96 95  BUN 16 17 25* 30* 28* 24*  CREATININE 0.53 0.52 0.54 0.52 0.51 0.57  CALCIUM 8.6* 8.7* 7.8* 8.7* 8.6* 8.4*  MG 1.7 2.0 2.1 1.7 1.7 1.4*  PHOS 3.7  --  5.9* 3.6 3.5 4.1   GFR: Estimated Creatinine Clearance: 51.5 mL/min (by C-G formula based on SCr of 0.57 mg/dL). Liver Function Tests: Recent Labs  Lab 05/14/21 0400 05/14/21 0554 05/17/21 0500 05/18/21 0433 05/19/21 0451 05/20/21 0500  AST 31 31 23   --   --   --   ALT 21 19 18   --   --   --   ALKPHOS 111 125 108  --   --   --   BILITOT <0.1* 0.5 0.2*  --   --   --   PROT 4.2* 4.4* 4.5*  --   --   --   ALBUMIN 1.6* 1.7* 1.8* 2.0* 2.1* 2.1*   No results for input(s): LIPASE, AMYLASE in the last 168 hours. No results for input(s): AMMONIA in the last 168 hours. Coagulation  Profile: No results for input(s): INR, PROTIME in the last 168 hours. Cardiac Enzymes: No results for input(s): CKTOTAL, CKMB, CKMBINDEX, TROPONINI in the last 168 hours. BNP (last 3 results) No results for input(s): PROBNP in the last 8760 hours. HbA1C: No results for input(s): HGBA1C in the last 72 hours. CBG: Recent Labs  Lab 05/18/21 1139 05/18/21 1529 05/19/21 0624 05/19/21 1542 05/20/21 0625  GLUCAP 101* 100* 96 110* 99   Lipid Profile: No results for input(s): CHOL, HDL, LDLCALC, TRIG, CHOLHDL, LDLDIRECT in the last 72 hours. Thyroid Function Tests: No results for input(s): TSH, T4TOTAL, FREET4, T3FREE, THYROIDAB in the last 72 hours. Anemia Panel: No results for input(s): VITAMINB12, FOLATE, FERRITIN, TIBC, IRON, RETICCTPCT in the last 72 hours. Sepsis Labs: No results for input(s): PROCALCITON, LATICACIDVEN in the last 168 hours.  No results found for this or any previous visit (from the past 240 hour(s)).    Radiology Studies: No results found.    Scheduled Meds:  (feeding supplement) PROSource Plus  30 mL Oral BID BM   apixaban  10 mg Oral BID   Followed by   Derrill Memo ON 05/22/2021] apixaban  5 mg Oral BID   Chlorhexidine Gluconate Cloth  6 each Topical Daily   feeding supplement  1 Container Oral TID BM   losartan  25 mg Oral Daily   mouth rinse  15 mL Mouth Rinse BID   megestrol  400 mg Oral BID   multivitamin with minerals  1 tablet Oral Daily   nadolol  20 mg Oral Daily   potassium chloride  40 mEq Oral BID WC   sodium chloride flush  10-40 mL Intracatheter Q12H   sodium chloride flush  3 mL Intravenous Q12H   spironolactone  25 mg Oral Daily   sucralfate  1 g Oral BID   thiamine  100 mg Oral Daily   torsemide  80 mg Oral BID   vitamin A  10,000 Units Oral Daily   Continuous Infusions:  sodium chloride 10 mL/hr at 05/11/21 2100     LOS: 17 days  Time spent: 30 minutes   Dessa Phi, DO [facility] 05/20/2021, 11:14 AM    Available via Epic secure chat 7am-7pm After these hours, please refer to coverage provider listed on [email or web address]

## 2021-05-21 LAB — BASIC METABOLIC PANEL
Anion gap: 7 (ref 5–15)
BUN: 32 mg/dL — ABNORMAL HIGH (ref 8–23)
CO2: 26 mmol/L (ref 22–32)
Calcium: 8.6 mg/dL — ABNORMAL LOW (ref 8.9–10.3)
Chloride: 104 mmol/L (ref 98–111)
Creatinine, Ser: 0.72 mg/dL (ref 0.44–1.00)
GFR, Estimated: >60 mL/min (ref >60)
Glucose, Bld: 103 mg/dL — ABNORMAL HIGH (ref 70–99)
Potassium: 3.9 mmol/L (ref 3.5–5.1)
Sodium: 137 mmol/L (ref 135–145)

## 2021-05-21 LAB — GLUCOSE, CAPILLARY
Glucose-Capillary: 101 mg/dL — ABNORMAL HIGH (ref 70–99)
Glucose-Capillary: 127 mg/dL — ABNORMAL HIGH (ref 70–99)
Glucose-Capillary: 116 mg/dL — ABNORMAL HIGH (ref 70–99)

## 2021-05-21 LAB — CBC
HCT: 28.5 % — ABNORMAL LOW (ref 36.0–46.0)
Hemoglobin: 9.4 g/dL — ABNORMAL LOW (ref 12.0–15.0)
MCH: 33 pg (ref 26.0–34.0)
MCHC: 33 g/dL (ref 30.0–36.0)
MCV: 100 fL (ref 80.0–100.0)
Platelets: 397 10*3/uL (ref 150–400)
RBC: 2.85 MIL/uL — ABNORMAL LOW (ref 3.87–5.11)
RDW: 18.3 % — ABNORMAL HIGH (ref 11.5–15.5)
WBC: 7.4 10*3/uL (ref 4.0–10.5)
nRBC: 0 % (ref 0.0–0.2)

## 2021-05-21 LAB — MAGNESIUM: Magnesium: 1.6 mg/dL — ABNORMAL LOW (ref 1.7–2.4)

## 2021-05-21 MED ORDER — MAGNESIUM SULFATE 2 GM/50ML IV SOLN
2.0000 g | Freq: Once | INTRAVENOUS | Status: AC
  Administered 2021-05-21: 2 g via INTRAVENOUS
  Filled 2021-05-21: qty 50

## 2021-05-21 MED ORDER — POTASSIUM CHLORIDE CRYS ER 20 MEQ PO TBCR
20.0000 meq | EXTENDED_RELEASE_TABLET | Freq: Two times a day (BID) | ORAL | Status: DC
Start: 2021-05-21 — End: 2021-05-22
  Administered 2021-05-21 – 2021-05-22 (×3): 20 meq via ORAL
  Filled 2021-05-21 (×3): qty 1

## 2021-05-21 MED FILL — Medication: Qty: 1 | Status: AC

## 2021-05-21 NOTE — TOC Progression Note (Signed)
Transition of Care [facility]) - Progression Note    Patient Details  Name: Colbi Schiltz MRN: [id number] Date of Birth: Mar 25, 1951  [facility] CM/SW Contact  Zenon Mayo, RN Phone Number: 05/21/2021, 10:24 AM  Clinical Narrative:    Patient is set up with [facility] services. Most likely will dc tomorrow, per MD, she is off of the TPN.  Also Cards is ordering lifevest. TOC will continue to follow .   Expected Discharge Plan: [place] Barriers to Discharge: Continued Medical Work up  Expected Discharge Plan and Services Expected Discharge Plan: [place]   Discharge Planning Services: CM Consult Post Acute Care Choice: Home Health, Durable Medical Equipment Living arrangements for the past 2 months: Single Family Home                 DME Arranged: Other see comment Harrel Lemon lift) DME Agency: [employer] Date DME Agency Contacted: 05/04/21 Time DME Agency Contacted: 68 Representative spoke with at DME Agency: [place] Arranged: RN, PT, OT, Nurse's Aide, Speech Therapy, Social Work [facility] Agency: [facility] Date [place]: 05/04/21 Time [place]: [place] Representative spoke with at [place]: [place] ([place]) Interventions    Readmission Risk Interventions No flowsheet data found.

## 2021-05-21 NOTE — Progress Notes (Signed)
Occupational Therapy Treatment Patient Details Name: Debbie Barry MRN: [id number] DOB: 08-12-1950 Today's Date: 05/21/2021   History of present illness Pt is a 70 y.o. female admitted from SNF on 05/02/21 with chest pain; found to have DVT, AKI, CHF, large bilateral pleural effusions. S/p L-side thoracentesis on 11/3. Pt suffered vfib/vtach arrest on 11/7 requiring 3-min CPR and shock.  Respiratory distress on 11/8 requiring non-rebreather. S/p R/LHC on 11/11. PMH includes spinal stenosis, chronic back pain, CHF, HTN, PVD, obesity; of note, recent admission 03/2021 for BLE weakness, found to have chronic L3-4 compression fx, severe L4-5 canal stenosis with plan for conservative management with lumbar brace.   OT comments  Patient seen by OT/PT co-treat for patient/therapist safety and functional transfers. Patient was agreeable to OT/PT treatment and asked to use BSC. Patient required assistance of 2 to transfer to [facility] with RW and assisted with clothing management but was max assist for hygiene.  Patient performed grooming and UB bathing/dressing seated at sink with setup and assistance for opening soap and lotion. Patient was educated on using bed to sit in chair position and to adjust for comfort.  Acute OT to continue to follow.    Recommendations for follow up therapy are one component of a multi-disciplinary discharge planning process, led by the attending physician.  Recommendations may be updated based on patient status, additional functional criteria and insurance authorization.    Follow Up Recommendations  Home health OT    Assistance Recommended at Discharge Frequent or constant Supervision/Assistance  Equipment Recommendations  None recommended by OT    Recommendations for Other Services      Precautions / Restrictions Precautions Precautions: Fall;Other (comment) Precaution Comments: bladder/bowel incontinence Required Braces or Orthoses: Spinal Brace Spinal Brace:  Lumbar corset Other Brace: Lumbar corset from prior admission (not in room)       Mobility Bed Mobility Overal bed mobility: Needs Assistance Bed Mobility: Supine to Sit;Sit to Supine     Supine to sit: Mod assist Sit to supine: Mod assist   General bed mobility comments: bed pad used to assist to get to eob    Transfers Overall transfer level: Needs assistance Equipment used: Rolling walker (2 wheels) Transfers: Sit to/from Stand;Bed to chair/wheelchair/BSC Sit to Stand: Min assist;Mod assist;+2 physical assistance;+2 safety/equipment Stand pivot transfers: +2 physical assistance;Min assist Step pivot transfers: Min assist;+2 safety/equipment       General transfer comment: required assistance to power up to stand and assitance for balance with transfers     Balance Overall balance assessment: Needs assistance Sitting-balance support: No upper extremity supported;Feet unsupported Sitting balance-Leahy Scale: Poor Sitting balance - Comments: improved posture noted on this date while sitting on EOB   Standing balance support: Bilateral upper extremity supported;During functional activity Standing balance-Leahy Scale: Poor Standing balance comment: reliant on BUE for support when standing                           ADL either performed or assessed with clinical judgement   ADL Overall ADL's : Needs assistance/impaired     Grooming: Wash/dry hands;Wash/dry face;Set up;Sitting Grooming Details (indicate cue type and reason): seated in chair at sink Upper Body Bathing: Sitting;Minimal assistance Upper Body Bathing Details (indicate cue type and reason): required assistance to open soap     Upper Body Dressing : Minimal assistance;Sitting Upper Body Dressing Details (indicate cue type and reason): donned gown     Toilet Transfer: +2 for physical assistance;Minimal  assistance;Stand-pivot;Rolling walker (2 wheels) Toilet Transfer Details (indicate cue type  and reason): used RW with verbal cues to reach back to sit Toileting- Clothing Manipulation and Hygiene: Maximal assistance;Sit to/from stand Toileting - Clothing Manipulation Details (indicate cue type and reason): assisted getting brief down before sitting but was max assist for toilet hygiene and puling brief up     Functional mobility during ADLs: Minimal assistance;Rolling walker (2 wheels);+2 for safety/equipment General ADL Comments: used BSC beside bed for toileting and sat at sink for grooming and UB bathing    Extremity/Trunk Assessment Upper Extremity Assessment RUE Deficits / Details: patient unable to lift shoulder past 20 degrees of flexion. noted to have mild edema LUE Deficits / Details: patient unable to flex shoulder past 20 degrees with moderate edema noted in UE            Vision       Perception     Praxis      Cognition Arousal/Alertness: Awake/alert Behavior During Therapy: Flat affect Overall Cognitive Status: No family/caregiver present to determine baseline cognitive functioning Area of Impairment: Attention;Memory;Following commands;Safety/judgement;Awareness;Problem solving                   Current Attention Level: Sustained Memory: Decreased short-term memory Following Commands: Follows one step commands inconsistently;Follows one step commands with increased time Safety/Judgement: Decreased awareness of safety;Decreased awareness of deficits Awareness: Intellectual Problem Solving: Slow processing;Decreased initiation;Difficulty sequencing;Requires verbal cues General Comments: was aware that she needed to use [facility]          Exercises     Shoulder Instructions       General Comments      Pertinent Vitals/ Pain       Pain Assessment: Faces Faces Pain Scale: Hurts little more Pain Location: back pain/spasms Pain Descriptors / Indicators: Discomfort;Grimacing;Spasm Pain Intervention(s): Monitored during  session;Repositioned;Patient requesting pain meds-RN notified  Home Living                                          Prior Functioning/Environment              Frequency  Min 2X/week        Progress Toward Goals  OT Goals(current goals can now be found in the care plan section)  Progress towards OT goals: Progressing toward goals  Acute Rehab OT Goals Patient Stated Goal: go home OT Goal Formulation: With patient Time For Goal Achievement: 05/31/21 Potential to Achieve Goals: Good ADL Goals Pt Will Perform Grooming: with set-up;sitting Pt Will Perform Upper Body Dressing: with set-up;sitting Pt Will Perform Lower Body Dressing: with mod assist;sit to/from stand Pt Will Transfer to Toilet: with mod assist;stand pivot transfer Pt Will Perform Toileting - Clothing Manipulation and hygiene: with mod assist;sit to/from stand Additional ADL Goal #1: Pt will perform OOB ADL tasks x 5 min with min A overall to increase activity tolerance  Plan Discharge plan remains appropriate    Co-evaluation    PT/OT/SLP Co-Evaluation/Treatment: Yes Reason for Co-Treatment: For patient/therapist safety;To address functional/ADL transfers   OT goals addressed during session: ADL's and self-care      AM-PAC OT "6 Clicks" Daily Activity     Outcome Measure   Help from another person eating meals?: None Help from another person taking care of personal grooming?: A Little Help from another person toileting, which includes using toliet, bedpan, or urinal?: A  Lot Help from another person bathing (including washing, rinsing, drying)?: A Lot Help from another person to put on and taking off regular upper body clothing?: A Lot Help from another person to put on and taking off regular lower body clothing?: Total 6 Click Score: 14    End of Session Equipment Utilized During Treatment: Gait belt;Rolling walker (2 wheels)  OT Visit Diagnosis: Unsteadiness on feet  (R26.81);Other abnormalities of gait and mobility (R26.89);History of falling (Z91.81);Muscle weakness (generalized) (M62.81)   Activity Tolerance Patient tolerated treatment well   Patient Left in chair;with bed alarm set;with nursing/sitter in room;Other (comment) (patient in chair position in bed)   Nurse Communication Patient requests pain meds        Time: 3832-9191 OT Time Calculation (min): 50 min  Charges: OT General Charges $OT Visit: 1 Visit OT Treatments $Self Care/Home Management : 23-37 mins  Lodema Hong, [place]  Pager [phone] Office [place] 05/21/2021, 3:18 PM

## 2021-05-21 NOTE — Progress Notes (Signed)
Physical Therapy Treatment Patient Details Name: Debbie Barry MRN: [id number] DOB: Jan 13, 1951 Today's Date: 05/21/2021   History of Present Illness Pt is a 70 y.o. female admitted from SNF on 05/02/21 with chest pain; found to have DVT, AKI, CHF, large bilateral pleural effusions. S/p L-side thoracentesis on 11/3. Pt suffered vfib/vtach arrest on 11/7 requiring 3-min CPR and shock.  Respiratory distress on 11/8 requiring non-rebreather. S/p R/LHC on 11/11. PMH includes spinal stenosis, chronic back pain, CHF, HTN, PVD, obesity; of note, recent admission 03/2021 for BLE weakness, found to have chronic L3-4 compression fx, severe L4-5 canal stenosis with plan for conservative management with lumbar brace.    PT Comments    STAR PT/OT treatment session: Pt is making good progress towards her goals and is looking forward to d/c home with [facility] services as early as tomorrow. Pt request BSC use before any other therapy. Pt is modA for bed mobility, minAx2 for transfers and min A for short distance ambulation with RW. Pt declines sitting up in recliner at end of session due to increased back pain. Agreeable to having bed placed in chair position with support in her low back. PT/OT will see again tomorrow to progress mobility to improve safety and caregiver burden at discharge.    Recommendations for follow up therapy are one component of a multi-disciplinary discharge planning process, led by the attending physician.  Recommendations may be updated based on patient status, additional functional criteria and insurance authorization.  Follow Up Recommendations  Home health PT     Assistance Recommended at Discharge Frequent or constant Supervision/Assistance  Equipment Recommendations  None recommended by PT (has w/c at home)    Recommendations for Other Services       Precautions / Restrictions Precautions Precautions: Fall;Other (comment) Precaution Comments: bladder/bowel  incontinence Required Braces or Orthoses: Spinal Brace Spinal Brace: Lumbar corset Other Brace: Lumbar corset from prior admission (not in room) Restrictions Weight Bearing Restrictions: No     Mobility  Bed Mobility Overal bed mobility: Needs Assistance Bed Mobility: Supine to Sit;Sit to Supine     Supine to sit: Mod assist Sit to supine: Mod assist   General bed mobility comments: bed pad used to assist to get to eob    Transfers Overall transfer level: Needs assistance Equipment used: Rolling walker (2 wheels) Transfers: Sit to/from Stand;Bed to chair/wheelchair/BSC Sit to Stand: Min assist;Mod assist;+2 physical assistance;+2 safety/equipment Stand pivot transfers: +2 physical assistance;Min assist   Step pivot transfers: Min assist;+2 safety/equipment     General transfer comment: required assistance to power up to stand and assitance for balance with transfers    Ambulation/Gait Ambulation/Gait assistance: Min assist;+2 physical assistance Gait Distance (Feet): 4 Feet Assistive device: Rolling walker (2 wheels) Gait Pattern/deviations: Step-to pattern;Decreased stride length;Trunk flexed Gait velocity: decreased Gait velocity interpretation: <1.31 ft/sec, indicative of household ambulator   General Gait Details: ambulates with RW from [facility] to chair at sink for self care, min A at hips with second person assist for RW management          Balance Overall balance assessment: Needs assistance Sitting-balance support: No upper extremity supported;Feet unsupported Sitting balance-Leahy Scale: Poor Sitting balance - Comments: improved posture noted on this date while sitting on EOB   Standing balance support: Bilateral upper extremity supported;During functional activity Standing balance-Leahy Scale: Poor Standing balance comment: reliant on BUE for support when standing  Cognition Arousal/Alertness: Awake/alert Behavior  During Therapy: Flat affect Overall Cognitive Status: No family/caregiver present to determine baseline cognitive functioning Area of Impairment: Attention;Memory;Following commands;Safety/judgement;Awareness;Problem solving                   Current Attention Level: Sustained Memory: Decreased short-term memory Following Commands: Follows one step commands inconsistently;Follows one step commands with increased time Safety/Judgement: Decreased awareness of safety;Decreased awareness of deficits Awareness: Intellectual Problem Solving: Slow processing;Decreased initiation;Difficulty sequencing;Requires verbal cues General Comments: was aware that she needed to use [facility]           General Comments General comments (skin integrity, edema, etc.): VSS on RA      Pertinent Vitals/Pain Pain Assessment: Faces Faces Pain Scale: Hurts little more Pain Location: back pain/spasms Pain Descriptors / Indicators: Discomfort;Grimacing;Spasm Pain Intervention(s): Limited activity within patient's tolerance;Monitored during session;Repositioned;Patient requesting pain meds-RN notified     PT Goals (current goals can now be found in the care plan section) Acute Rehab PT Goals PT Goal Formulation: With patient Time For Goal Achievement: 05/31/21 Potential to Achieve Goals: Fair Progress towards PT goals: Progressing toward goals    Frequency    Min 3X/week      PT Plan Discharge plan needs to be updated    Co-evaluation PT/OT/SLP Co-Evaluation/Treatment: Yes Reason for Co-Treatment: For patient/therapist safety PT goals addressed during session: Mobility/safety with mobility OT goals addressed during session: ADL's and self-care      AM-PAC PT "6 Clicks" Mobility   Outcome Measure  Help needed turning from your back to your side while in a flat bed without using bedrails?: A Little Help needed moving from lying on your back to sitting on the side of a flat bed without using  bedrails?: A Little Help needed moving to and from a bed to a chair (including a wheelchair)?: Total Help needed standing up from a chair using your arms (e.g., wheelchair or bedside chair)?: A Little Help needed to walk in hospital room?: A Little Help needed climbing 3-5 steps with a railing? : Total 6 Click Score: 14    End of Session Equipment Utilized During Treatment: Gait belt Activity Tolerance: Patient tolerated treatment well Patient left: in bed;with call bell/phone within reach;with bed alarm set Nurse Communication: Mobility status PT Visit Diagnosis: Difficulty in walking, not elsewhere classified (R26.2);Muscle weakness (generalized) (M62.81);History of falling (Z91.81)     Time: 9528-4132 PT Time Calculation (min) (ACUTE ONLY): 50 min  Charges:  $Therapeutic Activity: 8-22 mins                     Marsel Gail B. Migdalia Dk PT, DPT Acute Rehabilitation Services Pager [phone] Office [phone]    Arp 05/21/2021, 4:08 PM

## 2021-05-21 NOTE — Plan of Care (Signed)

## 2021-05-21 NOTE — Progress Notes (Addendum)
Made aware of anticipated discharge tomorrow I have ordered Life vest and contacted [employer] rep, they are aware.  Thanks you EP follow up will be arranged  Tommye Standard, PA-C  ADDEND Life vest was already ordered. I have confirmed is in the patients' room with her Life vest rep will come by later today to re-inforce teaching with her before she goes home EP follow up is in place  Tommye Standard, [place]

## 2021-05-21 NOTE — Plan of Care (Signed)

## 2021-05-21 NOTE — Progress Notes (Signed)
PROGRESS NOTE    Debbie Barry  SLH:[id number] DOB: 30-Oct-1950 DOA: 05/02/2021 PCP: Glendale Chard, MD     Brief Narrative:  Debbie Barry is a 70 year old F with PMH of fatty liver, osteomyelitis, HTN, GERD, ambulatory dysfunction and a recent hospitalization for lower extremity weakness due to spinal stenosis for which medical management was recommended and she was discharged to SNF. She returned to ED with chest pain and palpitation, and found to have age-indeterminate RLE DVT, large bilateral pleural effusion, anasarca, acute right renal infarct comprising less than 10% of right renal cortex.  She was started on IV heparin.  Pleural effusion and anasarca was felt to be due to malnutrition/hypoalbuminemia.  She underwent right thoracentesis with removal of 500 cc transudative culture negative fluid.  Cytology was negative as well.  Started on diuretics.    Patient went into V. fib cardiac arrest the morning of 11/7 with ROSC after 3 minutes of CPR prior to defibrillation.  Then she went into pulsatile VT and had 1 DCCV.  She was transferred to ICU after cardiology consultation.  K and Mg were found to be low.  TTE with LVEF of 40 to 45% and RWMA on 11/7.  Repeat TTE on 11/9 with LVEF of 25 to 30%, GH, G1 DD and moderately reduced RV SF.  R/LHC on 11/11 with normal right heart pressures and without obstructive CAD suggesting NICM.  And also started on TPN on 11/9.  GI consulted for malnutrition.  Concern about protein-losing enteropathy. Extensive work-up significant for low vitamin A, transferrin and ceruloplasmin but negative for C. difficile, GIP, Giardia, crypto, A1A, and celiac disease.  Diarrhea that has resolved with cholestyramine. TRH assumed care on 11/13.   P.o. intake improved.  She came off TPN on 11/18. Transitioned off IV Lasix.  Uptitrating Bumex for good urine output. GI and cardiology following from [place].  EP to place LifeVest prior to discharge.  New events last 24 hours  / Subjective: No new complaints this morning.  Denies chest pain, shortness of breath.  She is on room air.  No significant edema.  Had some issues with her pure wick.  Assessment & Plan:   Principal Problem:   DVT (deep venous thrombosis) (HCC) Active Problems:   Depression   Protein-calorie malnutrition, severe   Prolonged QT interval   Renal infarct (HCC)   Hyponatremia   Chronic retention of urine   Lumbar spinal stenosis   Anasarca   Bilateral pleural effusion   Cardiac arrest (HCC)   Cardiomyopathy (HCC)   Nonischemic cardiomyopathy (HCC)   Ventricular fibrillation/torsades followed by VT/prolonged QT: Likely secondary to electrolyte derangements in the setting of aggressive diuresis.  ROSC after 3 minutes of CPR.  Had pulsatile VT after ROSC see requiring 1 DCCV.  TTE with LVEF of 40 to 45% and RWMA on 11/7. Repeat TTE on 11/9 with LVEF of 25 to 30%, GH, G1-DD and moderately reduced RV SF.  R/LHC on 11/11 with normal right heart pressures and without obstructive CAD suggesting NICM. -On nadolol per EP.  Remains in NSR. -Per EP, not an ideal candidate for ICD implantation now.  EP to arrange for LifeVest for discharge.   Acute on chronic combined CHF: TTE and R/LHC as above.  She had bilateral pleural effusion and anasarca on presentation.  Fluid status improved with IV Lasix.  Now on torsemide.  -Continue torsemide, losartan, Aldactone   Anasarca: Multifactorial including CHF and severe malnutrition with severe hypoalbuminemia.  Concern for protein-losing  nephropathy.  Extensive work-up by GI as above.  Increasing edema after initial improvement. -Diuretics as above -Optimize nutrition -Improved   Acute respiratory failure with hypoxia.  Resolved.  On room air today    Symptomatic normocytic anemia: Likely anemia of critical illness and malnutrition.  She has low transferrin suggesting poor GI absorption.  Hemoglobin remains stable   Spinal stenosis/lower extremity  weakness/ambulatory dysfunction -Home health and DME ordered.  Patient continues to refuse SNF   Right lower extremity DVT. Age-indeterminate DVT extending all the way to the iliac vessels.  Continue Eliquis   Acute right renal infarct. About 10% of right renal cortex.  From arrhythmia?  Shunt?  Renal function stable.  Continue Eliquis   Lower back pain/pelvic pain/dysuria: Resolved.  UA with small LE but no nitrite or bacteria.  Previous urine culture with Pseudomonas and Enterococcus faecalis, likely colonization versus true infection.   Severe malnutrition as evidenced by anasarca and severe hypoalbuminemia.   -Off TPN now  Hypomagnesemia -Replace, trend   DVT prophylaxis: Eliquis Code Status: Full code Family Communication: No family at bedside Disposition Plan:  Status is: Inpatient  Remains inpatient appropriate because: Replace magnesium and continue PT.  Sent message to EP to arrange for LifeVest pending discharge home 11/22   Antimicrobials:  Anti-infectives (From admission, onward)    None        Objective: Vitals:   05/20/21 2000 05/20/21 2329 05/21/21 0301 05/21/21 0831  BP: 93/62 (!) 97/55 116/65 (!) 106/47  Pulse: 83 85 80 81  Resp: 16 16 15 16   Temp: 98.2 F (36.8 C) 98.1 F (36.7 C) 98.4 F (36.9 C) 98 F (36.7 C)  TempSrc: Oral Oral Oral Oral  SpO2: 99% 99% 99% 98%  Weight:   52.3 kg   Height:        Intake/Output Summary (Last 24 hours) at 05/21/2021 1010 Last data filed at 05/21/2021 0500 Gross per 24 hour  Intake 482 ml  Output 350 ml  Net 132 ml    Filed Weights   05/19/21 0433 05/20/21 0500 05/21/21 0301  Weight: 54.5 kg 54.1 kg 52.3 kg    Examination:  General exam: Appears calm and comfortable  Respiratory system: Clear to auscultation. Respiratory effort normal. No respiratory distress. No conversational dyspnea.  On room air Cardiovascular system: S1 & S2 heard, RRR. No murmurs. No pedal edema. Gastrointestinal system:  Abdomen is nondistended, soft and nontender. Normal bowel sounds heard. Central nervous system: Alert and oriented. No focal neurological deficits. Speech clear.  Extremities: Symmetric in appearance  Skin: No rashes, lesions or ulcers on exposed skin  Psychiatry: Judgement and insight appear normal. Mood & affect appropriate.   Data Reviewed: I have personally reviewed following labs and imaging studies  CBC: Recent Labs  Lab 05/15/21 0435 05/16/21 0200 05/17/21 0500 05/18/21 0433 05/19/21 0451 05/20/21 0500 05/21/21 0455  WBC 7.2  --   --  7.2  --   --  7.4  HGB 9.0*   < > 8.5* 9.2* 9.3* 9.3* 9.4*  HCT 25.6*   < > 24.3* 27.2* 28.5* 27.4* 28.5*  MCV 93.8  --   --  98.2  --   --  100.0  PLT 269  --   --  300  --   --  397   < > = values in this interval not displayed.    Basic Metabolic Panel: Recent Labs  Lab 05/17/21 0500 05/18/21 0433 05/19/21 0451 05/20/21 0500 05/21/21 0455  NA 136  138 136 138 137  K 4.3 3.1* 4.3 3.3* 3.9  CL 106 108 109 106 104  CO2 23 24 23 25 26   GLUCOSE 417* 112* 96 95 103*  BUN 25* 30* 28* 24* 32*  CREATININE 0.54 0.52 0.51 0.57 0.72  CALCIUM 7.8* 8.7* 8.6* 8.4* 8.6*  MG 2.1 1.7 1.7 1.4* 1.6*  PHOS 5.9* 3.6 3.5 4.1  --     GFR: Estimated Creatinine Clearance: 51.5 mL/min (by C-G formula based on SCr of 0.72 mg/dL). Liver Function Tests: Recent Labs  Lab 05/17/21 0500 05/18/21 0433 05/19/21 0451 05/20/21 0500  AST 23  --   --   --   ALT 18  --   --   --   ALKPHOS 108  --   --   --   BILITOT 0.2*  --   --   --   PROT 4.5*  --   --   --   ALBUMIN 1.8* 2.0* 2.1* 2.1*    No results for input(s): LIPASE, AMYLASE in the last 168 hours. No results for input(s): AMMONIA in the last 168 hours. Coagulation Profile: No results for input(s): INR, PROTIME in the last 168 hours. Cardiac Enzymes: No results for input(s): CKTOTAL, CKMB, CKMBINDEX, TROPONINI in the last 168 hours. BNP (last 3 results) No results for input(s): PROBNP in  the last 8760 hours. HbA1C: No results for input(s): HGBA1C in the last 72 hours. CBG: Recent Labs  Lab 05/19/21 0624 05/19/21 1542 05/20/21 0625 05/20/21 1554 05/21/21 0648  GLUCAP 96 110* 99 112* 101*    Lipid Profile: No results for input(s): CHOL, HDL, LDLCALC, TRIG, CHOLHDL, LDLDIRECT in the last 72 hours. Thyroid Function Tests: No results for input(s): TSH, T4TOTAL, FREET4, T3FREE, THYROIDAB in the last 72 hours. Anemia Panel: No results for input(s): VITAMINB12, FOLATE, FERRITIN, TIBC, IRON, RETICCTPCT in the last 72 hours. Sepsis Labs: No results for input(s): PROCALCITON, LATICACIDVEN in the last 168 hours.  No results found for this or any previous visit (from the past 240 hour(s)).    Radiology Studies: No results found.    Scheduled Meds:  (feeding supplement) PROSource Plus  30 mL Oral BID BM   apixaban  10 mg Oral BID   Followed by   Derrill Memo ON 05/22/2021] apixaban  5 mg Oral BID   Chlorhexidine Gluconate Cloth  6 each Topical Daily   feeding supplement  1 Container Oral TID BM   losartan  25 mg Oral Daily   mouth rinse  15 mL Mouth Rinse BID   megestrol  400 mg Oral BID   multivitamin with minerals  1 tablet Oral Daily   nadolol  20 mg Oral Daily   sodium chloride flush  10-40 mL Intracatheter Q12H   sodium chloride flush  3 mL Intravenous Q12H   spironolactone  25 mg Oral Daily   sucralfate  1 g Oral BID   thiamine  100 mg Oral Daily   torsemide  80 mg Oral BID   vitamin A  10,000 Units Oral Daily   Continuous Infusions:  sodium chloride 10 mL/hr at 05/11/21 2100   magnesium sulfate bolus IVPB 2 g (05/21/21 0915)     LOS: 18 days      Time spent: 25 minutes   Dessa Phi, DO [facility] 05/21/2021, 10:10 AM   Available via Epic secure chat 7am-7pm After these hours, please refer to coverage provider listed on [email or web address]

## 2021-05-22 ENCOUNTER — Other Ambulatory Visit (HOSPITAL_COMMUNITY): Payer: Self-pay

## 2021-05-22 LAB — BASIC METABOLIC PANEL
Anion gap: 10 (ref 5–15)
BUN: 38 mg/dL — ABNORMAL HIGH (ref 8–23)
CO2: 26 mmol/L (ref 22–32)
Calcium: 8.7 mg/dL — ABNORMAL LOW (ref 8.9–10.3)
Chloride: 100 mmol/L (ref 98–111)
Creatinine, Ser: 0.95 mg/dL (ref 0.44–1.00)
GFR, Estimated: >60 mL/min (ref >60)
Glucose, Bld: 111 mg/dL — ABNORMAL HIGH (ref 70–99)
Potassium: 3.7 mmol/L (ref 3.5–5.1)
Sodium: 136 mmol/L (ref 135–145)

## 2021-05-22 LAB — GLUCOSE, CAPILLARY: Glucose-Capillary: 115 mg/dL — ABNORMAL HIGH (ref 70–99)

## 2021-05-22 LAB — MAGNESIUM: Magnesium: 1.7 mg/dL (ref 1.7–2.4)

## 2021-05-22 MED ORDER — SPIRONOLACTONE 25 MG PO TABS
25.0000 mg | ORAL_TABLET | Freq: Every day | ORAL | 2 refills | Status: DC
  Filled 2021-05-22: qty 30, 30d supply, fill #0

## 2021-05-22 MED ORDER — SUCRALFATE 1 G PO TABS
1.0000 g | ORAL_TABLET | Freq: Two times a day (BID) | ORAL | 2 refills | Status: DC
  Filled 2021-05-22: qty 60, 30d supply, fill #0

## 2021-05-22 MED ORDER — LOSARTAN POTASSIUM 25 MG PO TABS
25.0000 mg | ORAL_TABLET | Freq: Every day | ORAL | 2 refills | Status: DC
  Filled 2021-05-22: qty 30, 30d supply, fill #0

## 2021-05-22 MED ORDER — NADOLOL 20 MG PO TABS
20.0000 mg | ORAL_TABLET | Freq: Every day | ORAL | 2 refills | Status: DC
  Filled 2021-05-22: qty 30, 30d supply, fill #0

## 2021-05-22 MED ORDER — APIXABAN 5 MG PO TABS
5.0000 mg | ORAL_TABLET | Freq: Two times a day (BID) | ORAL | 2 refills | Status: DC
  Filled 2021-05-22: qty 60, 30d supply, fill #0

## 2021-05-22 MED ORDER — POTASSIUM CHLORIDE CRYS ER 20 MEQ PO TBCR
20.0000 meq | EXTENDED_RELEASE_TABLET | Freq: Two times a day (BID) | ORAL | 2 refills | Status: DC
  Filled 2021-05-22: qty 60, 30d supply, fill #0

## 2021-05-22 MED ORDER — TORSEMIDE 20 MG PO TABS
40.0000 mg | ORAL_TABLET | Freq: Two times a day (BID) | ORAL | Status: DC
  Filled 2021-05-22: qty 2

## 2021-05-22 MED ORDER — MAGNESIUM SULFATE 4 GM/100ML IV SOLN
4.0000 g | Freq: Once | INTRAVENOUS | Status: AC
  Administered 2021-05-22: 4 g via INTRAVENOUS
  Filled 2021-05-22: qty 100

## 2021-05-22 MED ORDER — TORSEMIDE 20 MG PO TABS
40.0000 mg | ORAL_TABLET | Freq: Two times a day (BID) | ORAL | 2 refills | Status: DC
Start: 2021-05-22 — End: 2021-07-10
  Filled 2021-05-22: qty 120, 30d supply, fill #0

## 2021-05-22 MED ORDER — MAGNESIUM OXIDE 400 MG PO TABS
400.0000 mg | ORAL_TABLET | Freq: Every day | ORAL | 2 refills | Status: DC
  Filled 2021-05-22: qty 30, 30d supply, fill #0

## 2021-05-22 MED ORDER — MAGNESIUM OXIDE -MG SUPPLEMENT 400 (240 MG) MG PO TABS
400.0000 mg | ORAL_TABLET | Freq: Every day | ORAL | Status: DC
  Administered 2021-05-22: 400 mg via ORAL
  Filled 2021-05-22: qty 1

## 2021-05-22 NOTE — Progress Notes (Signed)
Occupational Therapy Treatment Patient Details Name: Debbie Barry MRN: [id number] DOB: 02/24/1951 Today's Date: 05/22/2021   History of present illness Pt is a 70 y.o. female admitted from SNF on 05/02/21 with chest pain; found to have DVT, AKI, CHF, large bilateral pleural effusions. S/p L-side thoracentesis on 11/3. Pt suffered vfib/vtach arrest on 11/7 requiring 3-min CPR and shock.  Respiratory distress on 11/8 requiring non-rebreather. S/p R/LHC on 11/11. PMH includes spinal stenosis, chronic back pain, CHF, HTN, PVD, obesity; of note, recent admission 03/2021 for BLE weakness, found to have chronic L3-4 compression fx, severe L4-5 canal stenosis with plan for conservative management with lumbar brace.   OT comments  Patient seen for co-treat due to assistance of 2 for sit to stands and safety with mobility. Patient received in bed and was apprehensive on participating due to expecting to return home. Patient was willing to participate after benefits of therapy were explained. Patient assisted with donning brief while supine and stood from EOB to complete. Patient performed mobility with RW requiring assistance of 2 to stand and one to ambulate and another to assist with equipment. Patient has made good gains with OT treatment.    Recommendations for follow up therapy are one component of a multi-disciplinary discharge planning process, led by the attending physician.  Recommendations may be updated based on patient status, additional functional criteria and insurance authorization.    Follow Up Recommendations  Home health OT    Assistance Recommended at Discharge Frequent or constant Supervision/Assistance  Equipment Recommendations  None recommended by OT    Recommendations for Other Services      Precautions / Restrictions Precautions Precautions: Fall;Other (comment) Precaution Comments: bladder/bowel incontinence Required Braces or Orthoses: Spinal Brace Spinal Brace:  Lumbar corset Other Brace: Lumbar corset from prior admission (not in room) Restrictions Weight Bearing Restrictions: No       Mobility Bed Mobility Overal bed mobility: Needs Assistance Bed Mobility: Supine to Sit;Sit to Supine     Supine to sit: Mod assist Sit to supine: Mod assist   General bed mobility comments: bed pad used to assist to get to eob    Transfers Overall transfer level: Needs assistance Equipment used: Rolling walker (2 wheels) Transfers: Sit to/from Stand Sit to Stand: Min assist;Mod assist;+2 physical assistance;+2 safety/equipment   Step pivot transfers: Min assist;+2 safety/equipment       General transfer comment: patient required assistance of 2 to power up from EOB     Balance Overall balance assessment: Needs assistance Sitting-balance support: No upper extremity supported;Feet unsupported Sitting balance-Leahy Scale: Poor Sitting balance - Comments: able to maintain sitting balance on EOB   Standing balance support: Bilateral upper extremity supported;During functional activity Standing balance-Leahy Scale: Poor Standing balance comment: reliant on BUE for support when standing                           ADL either performed or assessed with clinical judgement   ADL Overall ADL's : Needs assistance/impaired                     Lower Body Dressing: Moderate assistance;Bed level;Maximal assistance Lower Body Dressing Details (indicate cue type and reason): donned pull up brief with assitance to thread legs into briefs and patient was able to assist to pull up while supine and completed with max assist while standing             Functional mobility during  ADLs: Minimal assistance;Rolling walker (2 wheels);+2 for safety/equipment General ADL Comments: performed LB dressing with brief at bed level and completed while standing    Extremity/Trunk Assessment Upper Extremity Assessment RUE Deficits / Details: patient  unable to lift shoulder past 20 degrees of flexion. noted to have mild edema LUE Deficits / Details: patient unable to flex shoulder past 20 degrees with moderate edema noted in UE            Vision       Perception     Praxis      Cognition Arousal/Alertness: Awake/alert Behavior During Therapy: Flat affect Overall Cognitive Status: No family/caregiver present to determine baseline cognitive functioning Area of Impairment: Attention;Memory;Following commands;Safety/judgement;Awareness;Problem solving                   Current Attention Level: Sustained Memory: Decreased short-term memory Following Commands: Follows one step commands inconsistently;Follows one step commands with increased time Safety/Judgement: Decreased awareness of safety;Decreased awareness of deficits Awareness: Intellectual Problem Solving: Slow processing;Decreased initiation;Difficulty sequencing;Requires verbal cues General Comments: eager to return home          Exercises     Shoulder Instructions       General Comments      Pertinent Vitals/ Pain       Pain Assessment: Faces Faces Pain Scale: Hurts little more Pain Location: back pain Pain Descriptors / Indicators: Discomfort;Grimacing Pain Intervention(s): Monitored during session;Repositioned  Home Living                                          Prior Functioning/Environment              Frequency  Min 2X/week        Progress Toward Goals  OT Goals(current goals can now be found in the care plan section)  Progress towards OT goals: Progressing toward goals  Acute Rehab OT Goals Patient Stated Goal: go home OT Goal Formulation: With patient Time For Goal Achievement: 05/31/21 Potential to Achieve Goals: Good ADL Goals Pt Will Perform Grooming: with set-up;sitting Pt Will Perform Upper Body Dressing: with set-up;sitting Pt Will Perform Lower Body Dressing: with mod assist;sit to/from  stand Pt Will Transfer to Toilet: with mod assist;stand pivot transfer Pt Will Perform Toileting - Clothing Manipulation and hygiene: with mod assist;sit to/from stand Additional ADL Goal #1: Pt will perform OOB ADL tasks x 5 min with min A overall to increase activity tolerance  Plan Discharge plan remains appropriate    Co-evaluation    PT/OT/SLP Co-Evaluation/Treatment: Yes Reason for Co-Treatment: To address functional/ADL transfers;For patient/therapist safety   OT goals addressed during session: ADL's and self-care      AM-PAC OT "6 Clicks" Daily Activity     Outcome Measure   Help from another person eating meals?: None Help from another person taking care of personal grooming?: A Little Help from another person toileting, which includes using toliet, bedpan, or urinal?: A Lot Help from another person bathing (including washing, rinsing, drying)?: A Lot Help from another person to put on and taking off regular upper body clothing?: A Lot Help from another person to put on and taking off regular lower body clothing?: A Lot 6 Click Score: 15    End of Session Equipment Utilized During Treatment: Gait belt;Rolling walker (2 wheels)  OT Visit Diagnosis: Unsteadiness on feet (R26.81);Other abnormalities of gait and mobility (  R26.89);History of falling (Z91.81);Muscle weakness (generalized) (M62.81)   Activity Tolerance Patient tolerated treatment well   Patient Left in bed;with call bell/phone within reach;with bed alarm set   Nurse Communication Mobility status        Time: 9767-3419 OT Time Calculation (min): 31 min  Charges: OT General Charges $OT Visit: 1 Visit OT Treatments $Self Care/Home Management : 8-22 mins  Lodema Hong, Pierre Part  Pager [phone] Office [place] 05/22/2021, 11:00 AM

## 2021-05-22 NOTE — Progress Notes (Addendum)
Physical Therapy Treatment Patient Details Name: Debbie Barry MRN: [id number] DOB: 1951/03/26 Today's Date: 05/22/2021   History of Present Illness Pt is a 70 y.o. female admitted from SNF on 05/02/21 with chest pain; found to have DVT, AKI, CHF, large bilateral pleural effusions. S/p L-side thoracentesis on 11/3. Pt suffered vfib/vtach arrest on 11/7 requiring 3-min CPR and shock.  Respiratory distress on 11/8 requiring non-rebreather. S/p R/LHC on 11/11. PMH includes spinal stenosis, chronic back pain, CHF, HTN, PVD, obesity; of note, recent admission 03/2021 for BLE weakness, found to have chronic L3-4 compression fx, severe L4-5 canal stenosis with plan for conservative management with lumbar brace.    PT Comments    [facility] PT/OT session: Pt is very excited about going home today. PT Focus of session to progress ambulation endurance. With assist from OT pt able to don briefs for ambulation. Pt able to progress modA for bed mobility and transfers and min A for steadying with ambulation. Pt agreeable to sitting in bed in chair position with return to room. D/c plan remains appropriate.    Recommendations for follow up therapy are one component of a multi-disciplinary discharge planning process, led by the attending physician.  Recommendations may be updated based on patient status, additional functional criteria and insurance authorization.  Follow Up Recommendations  Home health PT     Assistance Recommended at Discharge Frequent or constant Supervision/Assistance  Equipment Recommendations  None recommended by PT (has w/c at home)       Precautions / Restrictions Precautions Precautions: Fall;Other (comment) Precaution Comments: bladder/bowel incontinence Required Braces or Orthoses: Spinal Brace Spinal Brace: Lumbar corset Other Brace: Lumbar corset from prior admission (not in room) Restrictions Weight Bearing Restrictions: No     Mobility  Bed Mobility Overal bed  mobility: Needs Assistance Bed Mobility: Supine to Sit;Sit to Supine     Supine to sit: Mod assist Sit to supine: Mod assist   General bed mobility comments: bed pad used to assist to get to eob    Transfers Overall transfer level: Needs assistance Equipment used: Rolling walker (2 wheels) Transfers: Sit to/from Stand Sit to Stand: Min assist;Mod assist;+2 physical assistance;+2 safety/equipment          General transfer comment: patient required assistance of 2 to power up from EOB    Ambulation/Gait Ambulation/Gait assistance: Min assist Gait Distance (Feet): 80 Feet Assistive device: Rolling walker (2 wheels) Gait Pattern/deviations: Step-through pattern;Decreased step length - right;Decreased step length - left;Shuffle;Trunk flexed;Decreased weight shift to left Gait velocity: decreased Gait velocity interpretation: <1.31 ft/sec, indicative of household ambulator   General Gait Details: min A for steadying with RW while walking in hallway, vc for proximity to RW and upright posture         Balance Overall balance assessment: Needs assistance Sitting-balance support: No upper extremity supported;Feet unsupported Sitting balance-Leahy Scale: Poor Sitting balance - Comments: able to maintain sitting balance on EOB   Standing balance support: Bilateral upper extremity supported;During functional activity Standing balance-Leahy Scale: Poor Standing balance comment: reliant on BUE for support when standing                            Cognition Arousal/Alertness: Awake/alert Behavior During Therapy: Flat affect Overall Cognitive Status: No family/caregiver present to determine baseline cognitive functioning Area of Impairment: Attention;Memory;Following commands;Safety/judgement;Awareness;Problem solving                   Current Attention Level: Sustained  Memory: Decreased short-term memory Following Commands: Follows one step commands  inconsistently;Follows one step commands with increased time Safety/Judgement: Decreased awareness of safety;Decreased awareness of deficits Awareness: Intellectual Problem Solving: Slow processing;Decreased initiation;Difficulty sequencing;Requires verbal cues General Comments: eager to return home           General Comments General comments (skin integrity, edema, etc.): VSS on RA      Pertinent Vitals/Pain Pain Assessment: Faces Faces Pain Scale: Hurts little more Pain Location: back pain Pain Descriptors / Indicators: Discomfort;Grimacing Pain Intervention(s): Limited activity within patient's tolerance;Monitored during session;Repositioned     PT Goals (current goals can now be found in the care plan section) Acute Rehab PT Goals PT Goal Formulation: With patient Time For Goal Achievement: 05/31/21 Potential to Achieve Goals: Fair Progress towards PT goals: Progressing toward goals    Frequency    Min 3X/week      PT Plan Discharge plan needs to be updated    Co-evaluation PT/OT/SLP Co-Evaluation/Treatment: Yes Reason for Co-Treatment: To address functional/ADL transfers PT goals addressed during session: Mobility/safety with mobility OT goals addressed during session: ADL's and self-care      AM-PAC PT "6 Clicks" Mobility   Outcome Measure  Help needed turning from your back to your side while in a flat bed without using bedrails?: None Help needed moving from lying on your back to sitting on the side of a flat bed without using bedrails?: A Little Help needed moving to and from a bed to a chair (including a wheelchair)?: A Little Help needed standing up from a chair using your arms (e.g., wheelchair or bedside chair)?: A Little Help needed to walk in hospital room?: A Little Help needed climbing 3-5 steps with a railing? : Total 6 Click Score: 17    End of Session Equipment Utilized During Treatment: Gait belt Activity Tolerance: Patient tolerated  treatment well Patient left: in bed;with call bell/phone within reach;with bed alarm set Nurse Communication: Mobility status PT Visit Diagnosis: Difficulty in walking, not elsewhere classified (R26.2);Muscle weakness (generalized) (M62.81);History of falling (Z91.81)     Time: 6811-5726 PT Time Calculation (min) (ACUTE ONLY): 31 min  Charges:  $Gait Training: 8-22 mins                     Debbie Barry B. Migdalia Dk PT, DPT Acute Rehabilitation Services Pager [phone] Office [phone]    [place] 05/22/2021, 1:07 PM

## 2021-05-22 NOTE — Progress Notes (Signed)
Patient given discharge instructions and stated understanding.  Patient is waiting for PICC line removal and PTAR for ride home.

## 2021-05-22 NOTE — Consult Note (Signed)
[facility] Inpatient Consult   05/22/2021  Kamiyah Kindel 1951/05/20 [id number]  [place] [facility] CM)  Patient chart reviewed for less than 30 days readmission and high risk score for unplanned readmission.   Primary Care Provider:  [facility] is an embedded provider with a Chronic Care Management team and program, and is listed for the transition of care follow up and appointments.     Spoke with patient bedside and explained [facility] CM services. Brochure provided. Patient verbalizes consent for [facility] CM post hospital follow up. Referral will be placed for outpatient care management and care coordination services with [facility] CM embedded office. Of note, [facility] Care Management services does not replace or interfere with any services that are arranged by inpatient case management or social work.   Netta Cedars, MSN, RN [facility] [employer] [phone]  Toll free office [phone]

## 2021-05-22 NOTE — Plan of Care (Signed)
  Problem: Education: Goal: Knowledge of General Education information will improve Description: Including pain rating scale, medication(s)/side effects and non-pharmacologic comfort measures Outcome: Adequate for Discharge   Problem: Health Behavior/Discharge Planning: Goal: Ability to manage health-related needs will improve Outcome: Adequate for Discharge   Problem: Clinical Measurements: Goal: Ability to maintain clinical measurements within normal limits will improve Outcome: Adequate for Discharge Goal: Will remain free from infection Outcome: Adequate for Discharge Goal: Diagnostic test results will improve Outcome: Adequate for Discharge Goal: Respiratory complications will improve Outcome: Adequate for Discharge Goal: Cardiovascular complication will be avoided Outcome: Adequate for Discharge   Problem: Activity: Goal: Risk for activity intolerance will decrease Outcome: Adequate for Discharge   Problem: Nutrition: Goal: Adequate nutrition will be maintained Outcome: Adequate for Discharge   Problem: Coping: Goal: Level of anxiety will decrease Outcome: Adequate for Discharge   Problem: Elimination: Goal: Will not experience complications related to bowel motility Outcome: Adequate for Discharge Goal: Will not experience complications related to urinary retention Outcome: Adequate for Discharge   Problem: Pain Managment: Goal: General experience of comfort will improve Outcome: Adequate for Discharge   Problem: Safety: Goal: Ability to remain free from injury will improve Outcome: Adequate for Discharge   Problem: Skin Integrity: Goal: Risk for impaired skin integrity will decrease Outcome: Adequate for Discharge   Problem: Acute Rehab PT Goals(only PT should resolve) Goal: Pt will Roll Supine to Side Outcome: Adequate for Discharge Goal: Pt Will Go Supine/Side To Sit Outcome: Adequate for Discharge Goal: Pt Will Go Sit To Supine/Side Outcome:  Adequate for Discharge Goal: Patient Will Transfer Sit To/From Stand Outcome: Adequate for Discharge Goal: Pt Will Transfer Bed To Chair/Chair To Bed Outcome: Adequate for Discharge   Problem: Education: Goal: Ability to demonstrate management of disease process will improve Outcome: Adequate for Discharge Goal: Ability to verbalize understanding of medication therapies will improve Outcome: Adequate for Discharge   Problem: Activity: Goal: Capacity to carry out activities will improve Outcome: Adequate for Discharge   Problem: Cardiac: Goal: Ability to achieve and maintain adequate cardiopulmonary perfusion will improve Outcome: Adequate for Discharge   Problem: Acute Rehab PT Goals(only PT should resolve) Goal: Pt Will Ambulate Outcome: Adequate for Discharge   Problem: Acute Rehab OT Goals (only OT should resolve) Goal: Pt. Will Perform Grooming Outcome: Adequate for Discharge Goal: Pt. Will Perform Upper Body Dressing Outcome: Adequate for Discharge Goal: Pt. Will Perform Lower Body Dressing Outcome: Adequate for Discharge Goal: Pt. Will Transfer To Toilet Outcome: Adequate for Discharge Goal: Pt. Will Perform Toileting-Clothing Manipulation Outcome: Adequate for Discharge Goal: OT Additional ADL Goal #1 Outcome: Adequate for Discharge

## 2021-05-22 NOTE — Discharge Summary (Signed)
Physician Discharge Summary  Caroline Longie HWE:[id number] DOB: 1950/08/23 DOA: 05/02/2021  PCP: Glendale Chard, MD  Admit date: 05/02/2021 Discharge date: 05/22/2021  Admitted From: Home Disposition:  Home with home health   Recommendations for Outpatient Follow-up:  Follow up with PCP in 1 week Follow up with Cardiology and EP   Discharge Condition: Stable CODE STATUS: Full  Diet recommendation: Heart healthy   Brief/Interim Summary: Korinne Greenstein is a 70 year old F with PMH of fatty liver, osteomyelitis, HTN, GERD, ambulatory dysfunction and a recent hospitalization for lower extremity weakness due to spinal stenosis for which medical management was recommended and she was discharged to SNF. She returned to ED with chest pain and palpitation, and found to have age-indeterminate RLE DVT, large bilateral pleural effusion, anasarca, acute right renal infarct comprising less than 10% of right renal cortex.  She was started on IV heparin.  Pleural effusion and anasarca was felt to be due to malnutrition/hypoalbuminemia.  She underwent right thoracentesis with removal of 500 cc transudative culture negative fluid.  Cytology was negative as well.  Started on diuretics.    Patient went into V. fib cardiac arrest the morning of 11/7 with ROSC after 3 minutes of CPR prior to defibrillation.  Then she went into pulsatile VT and had 1 DCCV.  She was transferred to ICU after cardiology consultation.  K and Mg were found to be low.  TTE with LVEF of 40 to 45% and RWMA on 11/7.  Repeat TTE on 11/9 with LVEF of 25 to 30%, GH, G1 DD and moderately reduced RV SF.  R/LHC on 11/11 with normal right heart pressures and without obstructive CAD suggesting NICM.  And also started on TPN on 11/9.  GI consulted for malnutrition.  Concern about protein-losing enteropathy. Extensive work-up significant for low vitamin A, transferrin and ceruloplasmin but negative for C. difficile, GIP, Giardia, crypto, A1A, and  celiac disease.  Diarrhea that has resolved with cholestyramine. TRH assumed care on 11/13.   P.o. intake improved.  She came off TPN on 11/18. Transitioned off IV Lasix.  Uptitrating Bumex for good urine output, which was changed to torsemide. EP ordered LifeVest prior to discharge.  Discharge Diagnoses:  Principal Problem:   Cardiac arrest [facility]) Active Problems:   Depression   Protein-calorie malnutrition, severe   Prolonged QT interval   DVT (deep venous thrombosis) (HCC)   Renal infarct (HCC)   Hyponatremia   Chronic retention of urine   Lumbar spinal stenosis   Anasarca   Bilateral pleural effusion   Cardiomyopathy (HCC)   Nonischemic cardiomyopathy (HCC)   Ventricular fibrillation/torsades followed by VT/prolonged QT: Likely secondary to electrolyte derangements in the setting of aggressive diuresis.  ROSC after 3 minutes of CPR.  Had pulsatile VT after ROSC see requiring 1 DCCV.  TTE with LVEF of 40 to 45% and RWMA on 11/7. Repeat TTE on 11/9 with LVEF of 25 to 30%, GH, G1-DD and moderately reduced RV SF.  R/LHC on 11/11 with normal right heart pressures and without obstructive CAD suggesting NICM. -On nadolol per EP.  Remains in NSR. -Per EP, not an ideal candidate for ICD implantation now.  EP arranged LifeVest for discharge.   Acute on chronic combined CHF: TTE and R/LHC as above.  She had bilateral pleural effusion and anasarca on presentation.  Fluid status improved with IV Lasix.  Now on torsemide.  -Continue torsemide, losartan, Aldactone   Anasarca: Multifactorial including CHF and severe malnutrition with severe hypoalbuminemia.  Concern for  protein-losing nephropathy.  Extensive work-up by GI as above.  Increasing edema after initial improvement. -Diuretics as above -Optimize nutrition -Improved   Acute respiratory failure with hypoxia.  Resolved.  On room air     Symptomatic normocytic anemia: Likely anemia of critical illness and malnutrition.  She has low  transferrin suggesting poor GI absorption.  Hemoglobin remains stable   Spinal stenosis/lower extremity weakness/ambulatory dysfunction -Home health and DME ordered.  Patient continues to refuse SNF   Right lower extremity DVT. Age-indeterminate DVT extending all the way to the iliac vessels.  Continue Eliquis   Acute right renal infarct. About 10% of right renal cortex.  From arrhythmia?  Shunt?  Renal function stable.  Continue Eliquis   Lower back pain/pelvic pain/dysuria: Resolved.  UA with small LE but no nitrite or bacteria.  Previous urine culture with Pseudomonas and Enterococcus faecalis, likely colonization versus true infection.   Severe malnutrition as evidenced by anasarca and severe hypoalbuminemia.   -Off TPN now   Hypomagnesemia -Replace, trend  Discharge Instructions  Discharge Instructions     (HEART FAILURE PATIENTS) Call MD:  Anytime you have any of the following symptoms: 1) 3 pound weight gain in 24 hours or 5 pounds in 1 week 2) shortness of breath, with or without a dry hacking cough 3) swelling in the hands, feet or stomach 4) if you have to sleep on extra pillows at night in order to breathe.   Complete by: As directed    Call MD for:  difficulty breathing, headache or visual disturbances   Complete by: As directed    Call MD for:  extreme fatigue   Complete by: As directed    Call MD for:  persistant dizziness or light-headedness   Complete by: As directed    Call MD for:  persistant nausea and vomiting   Complete by: As directed    Call MD for:  severe uncontrolled pain   Complete by: As directed    Call MD for:  temperature >100.4   Complete by: As directed    Discharge instructions   Complete by: As directed    You were cared for by a hospitalist during your hospital stay. If you have any questions about your discharge medications or the care you received while you were in the hospital after you are discharged, you can call the unit and ask to speak  with the hospitalist on call if the hospitalist that took care of you is not available. Once you are discharged, your primary care physician will handle any further medical issues. Please note that NO REFILLS for any discharge medications will be authorized once you are discharged, as it is imperative that you return to your primary care physician (or establish a relationship with a primary care physician if you do not have one) for your aftercare needs so that they can reassess your need for medications and monitor your lab values.   Increase activity slowly   Complete by: As directed    No wound care   Complete by: As directed       Allergies as of 05/22/2021   No Known Allergies      Medication List     STOP taking these medications    HYDROcodone-acetaminophen 5-325 MG tablet Commonly known as: Norco       TAKE these medications    acetaminophen 325 MG tablet Commonly known as: TYLENOL Take 650 mg by mouth every 6 (six) hours as needed for moderate pain.  apixaban 5 MG Tabs tablet Commonly known as: ELIQUIS Take 1 tablet (5 mg total) by mouth 2 (two) times daily.   bismuth subsalicylate 299 ME/26ST suspension Commonly known as: PEPTO BISMOL Take 30 mLs by mouth every 6 (six) hours as needed for indigestion.   calcium carbonate 750 MG chewable tablet Commonly known as: TUMS EX Chew 1 tablet by mouth daily as needed for heartburn.   feeding supplement Liqd Take 237 mLs by mouth 3 (three) times daily between meals.   hydrOXYzine 25 MG capsule Commonly known as: Vistaril Take 1 capsule (25 mg total) by mouth 2 (two) times daily as needed. What changed: reasons to take this   losartan 25 MG tablet Commonly known as: COZAAR Take 1 tablet (25 mg total) by mouth daily.   magnesium oxide 400 (240 Mg) MG tablet Commonly known as: MAG-OX Take 1 tablet (400 mg total) by mouth daily.   MILK OF MAGNESIA PO Take 30 mLs by mouth daily as needed (constipation).    multivitamin with minerals Tabs tablet Take 1 tablet by mouth daily.   nadolol 20 MG tablet Commonly known as: CORGARD Take 1 tablet (20 mg total) by mouth daily.   ondansetron 4 MG disintegrating tablet Commonly known as: Zofran ODT Take 1 tablet (4 mg total) by mouth every 8 (eight) hours as needed for nausea or vomiting.   pantoprazole 40 MG tablet Commonly known as: PROTONIX Take 1 tablet (40 mg total) by mouth daily.   potassium chloride SA 20 MEQ tablet Commonly known as: KLOR-CON Take 1 tablet (20 mEq total) by mouth 2 (two) times daily after a meal. What changed: when to take this   sertraline 25 MG tablet Commonly known as: ZOLOFT Take 1 tablet (25 mg total) by mouth daily.   spironolactone 25 MG tablet Commonly known as: ALDACTONE Take 1 tablet (25 mg total) by mouth daily.   sucralfate 1 GM/10ML suspension Commonly known as: CARAFATE Take 10 mLs (1 g total) by mouth 2 (two) times daily.   torsemide 20 MG tablet Commonly known as: DEMADEX Take 2 tablets (40 mg total) by mouth 2 (two) times daily.   VISINE OP Place 2 drops into both eyes daily as needed (for dry/irritated eyes).   Vitamin D3 125 MCG (5000 UT) Caps Take 5,000 Units by mouth daily.               Durable Medical Equipment  (From admission, onward)           Start     Ordered   05/15/21 0940  For home use only DME Vest life vest  Once       Comments: Length of need at least 3 months   05/15/21 0939   05/04/21 1153  For home use only DME Other see comment  Once       Comments: Harrel Lemon lift  Question:  Length of Need  Answer:  Lifetime   05/04/21 1153            [place] Follow up.   Specialty: [place] Why: [facility] arranged Contact information: [place] [address] [place] Follow up.   Why: hoyer lift arranged- to be delivered to home Contact  information: [address] [place] [address] [phone]         Evans Lance, MD  Follow up.   Specialty: Cardiology Why: 07/06/2021 @ 12:15PM, follow up on life vest and discuss further heart rhythm management Contact information: [address] [place] [address] [phone]         Glendale Chard, MD. Schedule an appointment as soon as possible for a visit in 1 week(s).   Specialty: Internal Medicine Contact information: [address] [place] [address] [phone]         [place], Alpine Thomas, MD. Schedule an appointment as soon as possible for a visit in 1 week(s).   Specialties: Cardiology, Internal Medicine, Radiology Contact information: [place] [address] [phone]                No Known Allergies    Procedures/Studies: DG Chest 1 View  Result Date: 05/03/2021 CLINICAL DATA:  Status post thoracentesis on the right. EXAM: CHEST  1 VIEW COMPARISON:  CT earlier today.  Radiograph yesterday. FINDINGS: No pneumothorax post right thoracentesis. Right pleural effusion was previously not well seen by radiograph. There is similar blunting of the costophrenic angle. Fluid is seen tracking in the right minor fissure. Unchanged left pleural effusion and basilar opacity. Stable cardiomegaly. No pulmonary edema. IMPRESSION: 1. No pneumothorax post right thoracentesis. 2. Unchanged left pleural effusion and basilar opacity. 3. Stable cardiomegaly. Electronically Signed   By: Keith Rake M.D.   On: 05/03/2021 15:24   DG Chest 2 View  Result Date: 05/02/2021 CLINICAL DATA:  Shortness of breath EXAM: CHEST - 2 VIEW COMPARISON:  08/12/2020 FINDINGS: Bandlike opacity in the right mid lung, likely atelectasis. Moderate retrocardiac left basilar opacity. Mild cardiomegaly. IMPRESSION: Right mid lung atelectasis. Left basilar opacities may indicate atelectasis or infection. Electronically Signed    By: Ulyses Jarred M.D.   On: 05/02/2021 20:48   DG Abd 1 View  Result Date: 05/14/2021 CLINICAL DATA:  Abdominal pain and diarrhea. EXAM: ABDOMEN - 1 VIEW COMPARISON:  Abdomen pelvis CT 05/02/2021 FINDINGS: Mild gaseous distention of the stomach. Scattered small bowel and colonic gas evident without obstructive pattern. Prominent stool volume in the rectum. Cluster of radiodensities in the left upper quadrant are presumably related to ingested material in the stomach as no calcifications are seen in the left abdomen on recent CT scan. IMPRESSION: 1. Nonobstructive bowel gas pattern. 2. Mild gaseous distention of the stomach. 3. Prominent stool volume in the rectum. Electronically Signed   By: Misty Stanley M.D.   On: 05/14/2021 10:30   CT Angio Chest PE W/Cm &/Or Wo Cm  Result Date: 05/03/2021 CLINICAL DATA:  Chest pain, right lower quadrant abdominal pain, urinary tract infection EXAM: CT ANGIOGRAPHY CHEST CT ABDOMEN AND PELVIS WITH CONTRAST TECHNIQUE: Multidetector CT imaging of the chest was performed using the standard protocol during bolus administration of intravenous contrast. Multiplanar CT image reconstructions and MIPs were obtained to evaluate the vascular anatomy. Multidetector CT imaging of the abdomen and pelvis was performed using the standard protocol during bolus administration of intravenous contrast. CONTRAST:  14mL OMNIPAQUE IOHEXOL 300 MG/ML  SOLN COMPARISON:  None. FINDINGS: CTA CHEST FINDINGS Cardiovascular: Adequate opacification of the pulmonary arterial tree. No intraluminal filling defect identified to suggest acute pulmonary embolism. Central pulmonary arteries are of normal caliber. Mild coronary artery calcification. Cardiac size is mildly enlarged. No pericardial effusion. Mild atherosclerotic calcification within the thoracic aorta. No aortic aneurysm. Mediastinum/Nodes: No enlarged mediastinal, hilar, or axillary lymph nodes. Thyroid gland, trachea, and esophagus  demonstrate no significant findings. Lungs/Pleura: Moderate  to large bilateral pleural effusions are present with compressive atelectasis of the lungs bilaterally with subtotal collapse of the left lower lobe and right middle lobe. No superimposed confluent pulmonary infiltrate. No pneumothorax. Central airways are patent. Musculoskeletal: Osseous structures are age-appropriate. No acute bone abnormality. No lytic or blastic bone lesion. Review of the MIP images confirms the above findings. CT ABDOMEN and PELVIS FINDINGS Hepatobiliary: No focal liver abnormality is seen. No gallstones, gallbladder wall thickening, or biliary dilatation. Pancreas: Unremarkable Spleen: Unremarkable Adrenals/Urinary Tract: The adrenal glands are unremarkable. There is a perfusion defect involving the upper pole of the right kidney in keeping with an acute renal infarct. The kidneys are otherwise unremarkable. Foley catheter balloon seen within a decompressed bladder lumen. Stomach/Bowel: Mild scattered colonic diverticulosis. Stomach, small bowel, and large bowel are otherwise unremarkable. Appendix normal. No free intraperitoneal gas. Mild ascites. Vascular/Lymphatic: Mild aortoiliac atherosclerotic calcification. No aortic aneurysm. A a filling defect is seen within the left internal iliac vein, axial image # 46/6, compatible with acute DVT. The abdominal vasculature is otherwise unremarkable. No pathologic adenopathy within the abdomen and pelvis. Reproductive: Uterus and bilateral adnexa are unremarkable. Other: There is marked diffuse body wall subcutaneous edema. No abdominal wall hernia. Musculoskeletal: . No lytic degenerative changes are seen within the lumbar spine. Age-indeterminate compression deformities of L3 and L4 are identified suspected chronic, however, given the lack of surrounding paravertebral edema or infiltration or blastic bone lesion. Review of the MIP images confirms the above findings. IMPRESSION: No  pulmonary embolism. Mild global cardiomegaly. Large bilateral pleural effusions, mild ascites, and marked diffuse body wall subcutaneous edema in keeping with anasarca, possibly related to cardiogenic failure. Acute right renal infarct. Infarct comprises less than 10% of the cortex of the right kidney. DVT within the left common iliac vein. Given the presence of a systemic visceral infarct, echocardiographic correlation for an intracardiac shunt and potential for paradoxical embolization may be helpful. Mild colonic diverticulosis. Aortic Atherosclerosis (ICD10-I70.0). Electronically Signed   By: Fidela Salisbury M.D.   On: 05/03/2021 00:44   MR CERVICAL SPINE WO CONTRAST  Result Date: 04/22/2021 CLINICAL DATA:  Motor neuron disease. Frequent falls with weakness of left lower extremities. 70 y.o. female past medical history significant for fatty liver, GERD, essential hypertension, osteomyelitis of the right toe, peripheral vascular disease, history of upper GI bleed due to peptic ulcers who was recently discharged from the hospital for gastroenteritis, iron overload and maculopapular rash she is coming in today complaining of progressive weakness both lower extremities, falls and numbness. EXAM: MRI CERVICAL SPINE WITHOUT CONTRAST TECHNIQUE: Multiplanar, multisequence MR imaging of the cervical spine was performed. No intravenous contrast was administered. COMPARISON:  Limited correlation made with cranial and thoracic MRI 01/18/2021. FINDINGS: Alignment: Near anatomic. There is a slight anterolisthesis at C4-5 and C7-T1. Vertebrae: No acute or suspicious osseous findings. Cord: Normal in signal and caliber. Posterior Fossa, vertebral arteries, paraspinal tissues: Visualized portions of the posterior fossa appear unremarkable. No significant paraspinal findings. Bilateral vertebral artery flow voids. Disc levels: C2-3: Mild disc bulging with moderate bilateral facet hypertrophy. The central spinal canal is  widely patent. Moderate right and mild left foraminal narrowing. C3-4: Mild disc bulging with bilateral uncinate spurring. Moderate to severe bilateral facet hypertrophy, worse on the right. The CSF surrounding the cord is partially effaced without cord deformity. Moderate to severe right and mild left foraminal narrowing. C4-5: There is loss of disc height with annular disc bulging and uncinate spurring. Moderate to severe facet hypertrophy  bilaterally. The CSF surrounding the cord is effaced without cord deformity. Moderate to severe foraminal narrowing bilaterally which could affect either C5 nerve root. C5-6: Spondylosis with loss of disc height and posterior osteophytes asymmetric the right. Milder facet degenerative changes at this level. No cord deformity. Moderate to severe right and mild left foraminal narrowing. C6-7: Spondylosis with loss of disc height and posterior osteophytes asymmetric to the right. The CSF surrounding the cord is partially effaced with mild right-sided cord flattening. Moderate to severe right and moderate left foraminal narrowing. C7-T1: Mild loss of disc height with disc bulging, uncinate spurring and bilateral facet hypertrophy. The CSF surrounding the cord is partially effaced without cord deformity. Moderate to severe foraminal narrowing bilaterally. The visualized upper thoracic spine appears unchanged from the recent thoracic MRI. IMPRESSION: 1. Multilevel cervical spondylosis with disc bulging, uncinate spurring and facet hypertrophy as described. 2. There is mild central spinal stenosis at multiple levels as detailed above. There is mild cord flattening on the right at C6-7. No abnormal cord signal. 3. Multilevel significant osseous foraminal narrowing which could affect the exiting nerve roots. 4. No acute findings demonstrated. Electronically Signed   By: Richardean Sale M.D.   On: 04/22/2021 10:52   DG Chest Left Decubitus  Result Date: 05/07/2021 CLINICAL DATA:   Possible pneumothorax. EXAM: CHEST - LEFT DECUBITUS COMPARISON:  Chest radiograph done earlier the same day. CT chest 05/03/2021. FINDINGS: No right-sided pneumothorax. Layering bilateral pleural effusions. Nodular density is seen in the right midlung zone. Correlation with CT 05/03/2021 is challenging due to a large pleural effusion and collapse/consolidation in the right lung on that study. IMPRESSION: 1. No right pneumothorax. 2. Nodular density in the right midlung zone. Recommend attention on follow-up. 3. Bilateral pleural effusions. Electronically Signed   By: Lorin Picket M.D.   On: 05/07/2021 13:15   CT Abdomen Pelvis W Contrast  Result Date: 05/03/2021 CLINICAL DATA:  Chest pain, right lower quadrant abdominal pain, urinary tract infection EXAM: CT ANGIOGRAPHY CHEST CT ABDOMEN AND PELVIS WITH CONTRAST TECHNIQUE: Multidetector CT imaging of the chest was performed using the standard protocol during bolus administration of intravenous contrast. Multiplanar CT image reconstructions and MIPs were obtained to evaluate the vascular anatomy. Multidetector CT imaging of the abdomen and pelvis was performed using the standard protocol during bolus administration of intravenous contrast. CONTRAST:  135mL OMNIPAQUE IOHEXOL 300 MG/ML  SOLN COMPARISON:  None. FINDINGS: CTA CHEST FINDINGS Cardiovascular: Adequate opacification of the pulmonary arterial tree. No intraluminal filling defect identified to suggest acute pulmonary embolism. Central pulmonary arteries are of normal caliber. Mild coronary artery calcification. Cardiac size is mildly enlarged. No pericardial effusion. Mild atherosclerotic calcification within the thoracic aorta. No aortic aneurysm. Mediastinum/Nodes: No enlarged mediastinal, hilar, or axillary lymph nodes. Thyroid gland, trachea, and esophagus demonstrate no significant findings. Lungs/Pleura: Moderate to large bilateral pleural effusions are present with compressive atelectasis of the  lungs bilaterally with subtotal collapse of the left lower lobe and right middle lobe. No superimposed confluent pulmonary infiltrate. No pneumothorax. Central airways are patent. Musculoskeletal: Osseous structures are age-appropriate. No acute bone abnormality. No lytic or blastic bone lesion. Review of the MIP images confirms the above findings. CT ABDOMEN and PELVIS FINDINGS Hepatobiliary: No focal liver abnormality is seen. No gallstones, gallbladder wall thickening, or biliary dilatation. Pancreas: Unremarkable Spleen: Unremarkable Adrenals/Urinary Tract: The adrenal glands are unremarkable. There is a perfusion defect involving the upper pole of the right kidney in keeping with an acute renal infarct. The kidneys  are otherwise unremarkable. Foley catheter balloon seen within a decompressed bladder lumen. Stomach/Bowel: Mild scattered colonic diverticulosis. Stomach, small bowel, and large bowel are otherwise unremarkable. Appendix normal. No free intraperitoneal gas. Mild ascites. Vascular/Lymphatic: Mild aortoiliac atherosclerotic calcification. No aortic aneurysm. A a filling defect is seen within the left internal iliac vein, axial image # 46/6, compatible with acute DVT. The abdominal vasculature is otherwise unremarkable. No pathologic adenopathy within the abdomen and pelvis. Reproductive: Uterus and bilateral adnexa are unremarkable. Other: There is marked diffuse body wall subcutaneous edema. No abdominal wall hernia. Musculoskeletal: . No lytic degenerative changes are seen within the lumbar spine. Age-indeterminate compression deformities of L3 and L4 are identified suspected chronic, however, given the lack of surrounding paravertebral edema or infiltration or blastic bone lesion. Review of the MIP images confirms the above findings. IMPRESSION: No pulmonary embolism. Mild global cardiomegaly. Large bilateral pleural effusions, mild ascites, and marked diffuse body wall subcutaneous edema in  keeping with anasarca, possibly related to cardiogenic failure. Acute right renal infarct. Infarct comprises less than 10% of the cortex of the right kidney. DVT within the left common iliac vein. Given the presence of a systemic visceral infarct, echocardiographic correlation for an intracardiac shunt and potential for paradoxical embolization may be helpful. Mild colonic diverticulosis. Aortic Atherosclerosis (ICD10-I70.0). Electronically Signed   By: Fidela Salisbury M.D.   On: 05/03/2021 00:44   CARDIAC CATHETERIZATION  Result Date: 05/11/2021   Prox LAD lesion is 20% stenosed.   Ost LAD to Prox LAD lesion is 20% stenosed. No  obstructive CAD with minimal coronary calcification at the ostium and proximal LAD with 20% narrowing.  Otherwise normal coronary arteries. Normal right heart pressures Findings compatible with a nonischemic cardiomyopathy. Significant calcification in the femoral arteries right greater than left with significant luminal calcification in the right femoral artery. RECOMMENDATION: Guideline directed medical therapy for severe LV dysfunction.  Avoidance of QT prolongation medications.  Patient has significant claudication symptomatology.   DG CHEST PORT 1 VIEW  Result Date: 05/08/2021 CLINICAL DATA:  Of breath EXAM: PORTABLE CHEST 1 VIEW COMPARISON:  Portable exam 1654 hours compared to 05/07/2021 FINDINGS: RIGHT arm PICC line with tip projecting over cavoatrial junction. Enlargement of cardiac silhouette with pulmonary vascular congestion. Diffuse pulmonary infiltrate consistent with pulmonary edema. Layering pleural effusions, better appreciated on preceding lateral decubitus radiographs. BILATERAL lower lobe atelectasis versus consolidation without pneumothorax. Questioned nodular density on prior exam not visualized. Osseous demineralization. IMPRESSION: Enlargement of cardiac silhouette with pulmonary vascular congestion and questionable perihilar edema. Layering BILATERAL pleural  effusions. Additional atelectasis versus consolidation of lower lobes. Electronically Signed   By: Lavonia Dana M.D.   On: 05/08/2021 17:10   DG CHEST PORT 1 VIEW  Result Date: 05/07/2021 CLINICAL DATA:  Cardiac arrest. EXAM: PORTABLE CHEST 1 VIEW COMPARISON:  05/03/2021 and CT chest 05/03/2021. FINDINGS: Trachea is midline. Heart is enlarged, stable. Thoracic aorta is calcified. Defibrillator pad overlies the mid chest. There is a vertically oriented linear density along the apical and lateral aspect of the right hemithorax but there may be parenchymal markings extending beyond margin. Interstitial prominence and indistinctness with bilateral pleural effusions. Left lower lobe collapse/consolidation. Degenerative changes in the shoulders. IMPRESSION: 1. Possible artifact versus small pneumothorax in the right hemithorax. Recommend left lateral decubitus view in further evaluation, as clinically indicated. These results will be called to the ordering clinician or representative by the Radiologist Assistant, and communication documented in the PACS or [employer]. 2. Congestive heart failure. 3. Left  lower lobe collapse/consolidation. Difficult to exclude aspiration. Electronically Signed   By: Lorin Picket M.D.   On: 05/07/2021 08:55   ECHOCARDIOGRAM COMPLETE  Result Date: 05/07/2021    ECHOCARDIOGRAM REPORT   Patient Name:   DONNIE GEDEON Date of Exam: 05/07/2021 Medical Rec #:  [id number]          Height:       61.0 in Accession #:    [id number]         Weight:       136.0 lb Date of Birth:  1951/02/01         BSA:          1.603 m Patient Age:    70 years           BP:           126/66 mmHg Patient Gender: F                  HR:           93 bpm. Exam Location:  Inpatient Procedure: 2D Echo, Cardiac Doppler and Color Doppler Indications:    Cardiac arrest  History:        Patient has prior history of Echocardiogram examinations, most                 recent 04/21/2021. CHF, PAD,  Arrythmias:Cardiac Arrest and                 Ventricular Fibrillation, Signs/Symptoms:Chest Pain and Large                 pleural effusion; Risk Factors:Hypertension.  Sonographer:    Dustin Flock RDCS Referring Phys: [id number] [place]  1. Septal and apical hypokinesis . Left ventricular ejection fraction, by estimation, is 40 to 45%. The left ventricle has mildly decreased function. The left ventricle demonstrates regional wall motion abnormalities (see scoring diagram/findings for description). Left ventricular diastolic parameters were normal.  2. Right ventricular systolic function is normal. The right ventricular size is normal.  3. There is a persistent pleurs effusion and trivial pericardial effusion Also appears to be prominent epicardial fat seen over RV. Can consider f/u CT chest if clinically indicated to further assess. The pericardial effusion is lateral to the left ventricle.  4. The mitral valve is abnormal. Trivial mitral valve regurgitation. No evidence of mitral stenosis.  5. The aortic valve is tricuspid. There is mild calcification of the aortic valve. Aortic valve regurgitation is not visualized. Mild aortic valve sclerosis is present, with no evidence of aortic valve stenosis.  6. The inferior vena cava is normal in size with greater than 50% respiratory variability, suggesting right atrial pressure of 3 mmHg. FINDINGS  Left Ventricle: Septal and apical hypokinesis. Left ventricular ejection fraction, by estimation, is 40 to 45%. The left ventricle has mildly decreased function. The left ventricle demonstrates regional wall motion abnormalities. The left ventricular internal cavity size was normal in size. There is no left ventricular hypertrophy. Left ventricular diastolic parameters were normal. Right Ventricle: The right ventricular size is normal. No increase in right ventricular wall thickness. Right ventricular systolic function is normal. Left Atrium: Left atrial  size was normal in size. Right Atrium: Right atrial size was normal in size. Pericardium: There is a persistent pleurs effusion and trivial pericardial effusion Also appears to be prominent epicardial fat seen over RV. Can consider f/u CT chest if clinically indicated to further assess. Trivial pericardial effusion is present.  The pericardial effusion is lateral to the left ventricle. Mitral Valve: The mitral valve is abnormal. Trivial mitral valve regurgitation. No evidence of mitral valve stenosis. Tricuspid Valve: The tricuspid valve is normal in structure. Tricuspid valve regurgitation is trivial. No evidence of tricuspid stenosis. Aortic Valve: The aortic valve is tricuspid. There is mild calcification of the aortic valve. Aortic valve regurgitation is not visualized. Mild aortic valve sclerosis is present, with no evidence of aortic valve stenosis. Pulmonic Valve: The pulmonic valve was normal in structure. Pulmonic valve regurgitation is not visualized. No evidence of pulmonic stenosis. Aorta: The aortic root is normal in size and structure. Venous: The inferior vena cava is normal in size with greater than 50% respiratory variability, suggesting right atrial pressure of 3 mmHg. IAS/Shunts: No atrial level shunt detected by color flow Doppler.  LEFT VENTRICLE PLAX 2D LVIDd:         4.50 cm     Diastology LVIDs:         2.90 cm     LV e' medial:    4.57 cm/s LV PW:         1.00 cm     LV E/e' medial:  10.0 LV IVS:        1.00 cm     LV e' lateral:   5.77 cm/s LVOT diam:     2.00 cm     LV E/e' lateral: 7.9 LV SV:         52 LV SV Index:   32 LVOT Area:     3.14 cm  LV Volumes (MOD) LV vol d, MOD A4C: 73.0 ml LV vol s, MOD A4C: 35.5 ml LV SV MOD A4C:     73.0 ml RIGHT VENTRICLE RV Basal diam:  2.70 cm RV S prime:     11.10 cm/s TAPSE (M-mode): 1.5 cm LEFT ATRIUM             Index        RIGHT ATRIUM           Index LA diam:        2.30 cm 1.43 cm/m   RA Area:     10.50 cm LA Vol (A2C):   23.9 ml 14.91 ml/m   RA Volume:   20.30 ml  12.66 ml/m LA Vol (A4C):   39.9 ml 24.89 ml/m LA Biplane Vol: 31.6 ml 19.71 ml/m  AORTIC VALVE LVOT Vmax:   103.00 cm/s LVOT Vmean:  70.200 cm/s LVOT VTI:    0.164 m  AORTA Ao Root diam: 2.70 cm MITRAL VALVE MV Area (PHT): 5.62 cm    SHUNTS MV Decel Time: 135 msec    Systemic VTI:  0.16 m MV E velocity: 45.80 cm/s  Systemic Diam: 2.00 cm MV A velocity: 71.10 cm/s MV E/A ratio:  0.64 Jenkins Rouge MD Electronically signed by Jenkins Rouge MD Signature Date/Time: 05/07/2021/2:32:42 PM    Final    VAS [place] LOWER EXTREMITY VENOUS (DVT)  Result Date: 05/03/2021  Lower Venous DVT Study Patient Name:  DIAMONIQUE RUEDAS  Date of Exam:   05/03/2021 Medical Rec #: [id number]           Accession #:    [id number] Date of Birth: [birth year]          Patient Gender: F Patient Age:   84 years Exam Location:  [facility] Procedure:      VAS [place] LOWER EXTREMITY VENOUS (DVT) Referring Phys: Terrilee Croak --------------------------------------------------------------------------------  Indications: Swelling,  and Edema.  Comparison Study: no prior Performing Technologist: Archie Patten RVS  Examination Guidelines: A complete evaluation includes B-mode imaging, spectral Doppler, color Doppler, and power Doppler as needed of all accessible portions of each vessel. Bilateral testing is considered an integral part of a complete examination. Limited examinations for reoccurring indications may be performed as noted. The reflux portion of the exam is performed with the patient in reverse Trendelenburg.  +--------+---------------+---------+-----------+----------+--------------------+ RIGHT   CompressibilityPhasicitySpontaneityPropertiesThrombus Aging       +--------+---------------+---------+-----------+----------+--------------------+ CFV     Partial        Yes      Yes                  Age Indeterminate    +--------+---------------+---------+-----------+----------+--------------------+ SFJ      Full                                                              +--------+---------------+---------+-----------+----------+--------------------+ FV Prox Partial                                      Age Indeterminate    +--------+---------------+---------+-----------+----------+--------------------+ FV Mid  Full           Yes      Yes                                       +--------+---------------+---------+-----------+----------+--------------------+ FV                     Yes      Yes                  patent by color      Distal                                               doppler              +--------+---------------+---------+-----------+----------+--------------------+ PFV     Full                                                              +--------+---------------+---------+-----------+----------+--------------------+ POP     Full           Yes      Yes                                       +--------+---------------+---------+-----------+----------+--------------------+ PTV     Full                                                              +--------+---------------+---------+-----------+----------+--------------------+  PERO                                                 Not well visualized  +--------+---------------+---------+-----------+----------+--------------------+   +--------+---------------+---------+-----------+----------+--------------------+ LEFT    CompressibilityPhasicitySpontaneityPropertiesThrombus Aging       +--------+---------------+---------+-----------+----------+--------------------+ CFV     Full           Yes      Yes                                       +--------+---------------+---------+-----------+----------+--------------------+ SFJ     Full                                                              +--------+---------------+---------+-----------+----------+--------------------+ FV Prox  Full                                                              +--------+---------------+---------+-----------+----------+--------------------+ FV Mid  Full           Yes      Yes                  patent by color                                                           doppler              +--------+---------------+---------+-----------+----------+--------------------+ FV                     Yes      Yes                  patent by color      Distal                                               doppler              +--------+---------------+---------+-----------+----------+--------------------+ POP     Full           Yes      Yes                                       +--------+---------------+---------+-----------+----------+--------------------+ PTV     Full                                                              +--------+---------------+---------+-----------+----------+--------------------+  PERO                                                 Not well visualized  +--------+---------------+---------+-----------+----------+--------------------+     Summary: RIGHT: - Findings consistent with age indeterminate deep vein thrombosis involving the right common femoral vein, and right femoral vein. - No cystic structure found in the popliteal fossa.  LEFT: - There is no evidence of deep vein thrombosis in the lower extremity.  - No cystic structure found in the popliteal fossa.  *See table(s) above for measurements and observations. Electronically signed by Jamelle Haring on 05/03/2021 at 1:00:04 PM.    Final    VAS [place] UPPER EXTREMITY VENOUS DUPLEX  Result Date: 05/10/2021 UPPER VENOUS STUDY  Patient Name:  Ainsleigh Kakos  Date of Exam:   05/09/2021 Medical Rec #: [id number]           Accession #:    [id number] Date of Birth: [birth year]          Patient Gender: F Patient Age:   85 years Exam Location:  [facility] Procedure:      VAS [place] UPPER  EXTREMITY VENOUS DUPLEX Referring Phys: Trevor Mace --------------------------------------------------------------------------------  Indications: Edema Comparison Study: No previous exams Performing Technologist: Rogelia Rohrer RVT, RDMS Supporting Technologist: RVT, RDMS  Examination Guidelines: A complete evaluation includes B-mode imaging, spectral Doppler, color Doppler, and power Doppler as needed of all accessible portions of each vessel. Bilateral testing is considered an integral part of a complete examination. Limited examinations for reoccurring indications may be performed as noted.  Right Findings: +----------+------------+---------+-----------+----------+-------+ RIGHT     CompressiblePhasicitySpontaneousPropertiesSummary +----------+------------+---------+-----------+----------+-------+ Subclavian    Full       Yes       Yes                      +----------+------------+---------+-----------+----------+-------+  Left Findings: +----------+------------+---------+-----------+----------+-------+ LEFT      CompressiblePhasicitySpontaneousPropertiesSummary +----------+------------+---------+-----------+----------+-------+ IJV           Full       Yes       Yes                      +----------+------------+---------+-----------+----------+-------+ Subclavian    Full       Yes       Yes                      +----------+------------+---------+-----------+----------+-------+ Axillary      Full       Yes       Yes                      +----------+------------+---------+-----------+----------+-------+ Brachial      Full       Yes       Yes                      +----------+------------+---------+-----------+----------+-------+ Radial        Full                                          +----------+------------+---------+-----------+----------+-------+ Ulnar         Full                                           +----------+------------+---------+-----------+----------+-------+  Cephalic      Full                                          +----------+------------+---------+-----------+----------+-------+ Basilic       Full       Yes       Yes                      +----------+------------+---------+-----------+----------+-------+  Summary:  Right: No evidence of thrombosis in the subclavian.  Left: No evidence of deep vein thrombosis in the upper extremity. No evidence of superficial vein thrombosis in the upper extremity.  *See table(s) above for measurements and observations.  Diagnosing physician: Monica Martinez MD Electronically signed by Monica Martinez MD on 05/10/2021 at 3:32:10 PM.    Final    ECHOCARDIOGRAM LIMITED  Result Date: 05/09/2021    ECHOCARDIOGRAM LIMITED REPORT   Patient Name:   SHAMECA LANDEN Date of Exam: 05/09/2021 Medical Rec #:  [id number]          Height:       61.0 in Accession #:    [id number]         Weight:       134.9 lb Date of Birth:  1951-03-28         BSA:          1.598 m Patient Age:    19 years           BP:           135/93 mmHg Patient Gender: F                  HR:           117 bpm. Exam Location:  Inpatient Procedure: 2D Echo Indications:    acute systolic chf  History:        Patient has prior history of Echocardiogram examinations, most                 recent 05/07/2021.  Sonographer:    Johny Chess RDCS Referring Phys: [id number] [place]  1. Left ventricular ejection fraction, by estimation, is 25 to 30%. The left ventricle has severely decreased function. The left ventricle demonstrates global hypokinesis. Left ventricular diastolic parameters are consistent with Grade I diastolic dysfunction (impaired relaxation).  2. Right ventricular systolic function is moderately reduced. The right ventricular size is mildly enlarged. Tricuspid regurgitation signal is inadequate for assessing PA pressure.  3. The mitral valve is normal in  structure. Mild mitral valve regurgitation. No evidence of mitral stenosis.  4. The aortic valve is tricuspid. Aortic valve regurgitation is not visualized. Mild aortic valve sclerosis is present, with no evidence of aortic valve stenosis.  5. The inferior vena cava is dilated in size with <50% respiratory variability, suggesting right atrial pressure of 15 mmHg.  6. There is a left pleural effusion.  7. Limited echo, biventricular dysfunction. FINDINGS  Left Ventricle: Left ventricular ejection fraction, by estimation, is 25 to 30%. The left ventricle has severely decreased function. The left ventricle demonstrates global hypokinesis. The left ventricular internal cavity size was normal in size. There is no left ventricular hypertrophy. Left ventricular diastolic parameters are consistent with Grade I diastolic dysfunction (impaired relaxation). Right Ventricle: The right ventricular size is mildly enlarged. Right ventricular systolic function is moderately reduced. Tricuspid regurgitation signal is  inadequate for assessing PA pressure. Pericardium: There is a left pleural effusion. There is no evidence of pericardial effusion. Mitral Valve: The mitral valve is normal in structure. Mild mitral annular calcification. Mild mitral valve regurgitation. No evidence of mitral valve stenosis. Tricuspid Valve: The tricuspid valve is normal in structure. Tricuspid valve regurgitation is not demonstrated. Aortic Valve: The aortic valve is tricuspid. Aortic valve regurgitation is not visualized. Mild aortic valve sclerosis is present, with no evidence of aortic valve stenosis. Venous: The inferior vena cava is dilated in size with less than 50% respiratory variability, suggesting right atrial pressure of 15 mmHg. LEFT VENTRICLE PLAX 2D LVIDd:         5.00 cm     Diastology LVIDs:         4.00 cm     LV e' lateral: 7.83 cm/s LV PW:         0.80 cm LV IVS:        0.60 cm  LV Volumes (MOD) LV vol d, MOD A4C: 71.2 ml LV vol s,  MOD A4C: 46.4 ml LV SV MOD A4C:     71.2 ml IVC IVC diam: 2.30 cm LEFT ATRIUM         Index LA diam:    3.40 cm 2.13 cm/m  AORTIC VALVE LVOT Vmax:   86.80 cm/s LVOT Vmean:  63.000 cm/s LVOT VTI:    0.122 m  AORTA Ao Asc diam: 2.90 cm  SHUNTS Systemic VTI: 0.12 m [place] Electronically signed by Franki Monte Signature Date/Time: 05/09/2021/6:52:14 PM    Final    [place] EKG SITE RITE  Result Date: 05/07/2021 If Site Rite image not attached, placement could not be confirmed due to current cardiac rhythm.  US Abdomen Limited RUQ (LIVER/GB)  Result Date: 05/07/2021 CLINICAL DATA:  Ascites on CT scan dated 05/03/2021 EXAM: ULTRASOUND ABDOMEN LIMITED RIGHT UPPER QUADRANT COMPARISON:  None. FINDINGS: Gallbladder: No gallstones. Mild focal thickening of the gallbladder wall measuring up to 4 mm. The technologist reported positive sonographic Murphy's sign. Common bile duct: Diameter: 2 mm Liver: No focal lesion identified. Within normal limits in parenchymal echogenicity. Portal vein is patent on color Doppler imaging with normal direction of blood flow towards the liver. Other: Mild ascites IMPRESSION: 1.  Mild ascites. 2. Mild focal gallbladder wall thickening and positive sonographic Murphy sign, nonspecific findings. This may be sequela of chronic ascites and/or volume overload. Clinical correlation is suggested. Electronically Signed   By: Keane Police D.O.   On: 05/07/2021 15:54   IR THORACENTESIS ASP PLEURAL SPACE W/IMG GUIDE  Result Date: 05/03/2021 INDICATION: Patient with a history of heart failure presents today with bilateral pleural effusions. Interventional radiology asked to perform a diagnostic and therapeutic thoracentesis. EXAM: ULTRASOUND GUIDED THORACENTESIS MEDICATIONS: 1% lidocaine 20 mL COMPLICATIONS: None immediate. PROCEDURE: An ultrasound guided thoracentesis was thoroughly discussed with the patient and questions answered. The benefits, risks, alternatives and complications were  also discussed. The patient understands and wishes to proceed with the procedure. Written consent was obtained. Ultrasound was performed to localize and mark an adequate pocket of fluid in the right chest. The area was then prepped and draped in the normal sterile fashion. 1% Lidocaine was used for local anesthesia. Under ultrasound guidance a 6 Fr Safe-T-Centesis catheter was introduced. Thoracentesis was performed. The catheter was removed and a dressing applied. FINDINGS: A total of approximately 500 mL of clear yellow fluid was removed. Samples were sent to the laboratory as requested by the clinical  team. IMPRESSION: Successful ultrasound guided right thoracentesis yielding 500 mL of pleural fluid. Read by: Soyla Dryer, NP Electronically Signed   By: Jacqulynn Cadet M.D.   On: 05/03/2021 16:15       Discharge Exam: Vitals:   05/22/21 0611 05/22/21 0759  BP:  106/63  Pulse: 78 80  Resp: 16 17  Temp:  98.6 F (37 C)  SpO2: 100% 99%    General: Pt is alert, awake, not in acute distress Cardiovascular: RRR, S1/S2 +, no edema Respiratory: CTA bilaterally, no wheezing, no rhonchi, no respiratory distress, no conversational dyspnea, on room air  Abdominal: Soft, NT, ND, bowel sounds + Extremities: no edema, no cyanosis Psych: Normal mood and affect, stable judgement and insight     The results of significant diagnostics from this hospitalization (including imaging, microbiology, ancillary and laboratory) are listed below for reference.     Microbiology: No results found for this or any previous visit (from the past 240 hour(s)).   Labs: BNP (last 3 results) Recent Labs    05/03/21 0304 05/07/21 0900 05/18/21 0433  BNP 1,679.7* 482.9* 175.1*   Basic Metabolic Panel: Recent Labs  Lab 05/17/21 0500 05/18/21 0433 05/19/21 0451 05/20/21 0500 05/21/21 0455 05/22/21 0230  NA 136 138 136 138 137 136  K 4.3 3.1* 4.3 3.3* 3.9 3.7  CL 106 108 109 106 104 100  CO2 23 24 23  25 26 26   GLUCOSE 417* 112* 96 95 103* 111*  BUN 25* 30* 28* 24* 32* 38*  CREATININE 0.54 0.52 0.51 0.57 0.72 0.95  CALCIUM 7.8* 8.7* 8.6* 8.4* 8.6* 8.7*  MG 2.1 1.7 1.7 1.4* 1.6* 1.7  PHOS 5.9* 3.6 3.5 4.1  --   --    Liver Function Tests: Recent Labs  Lab 05/17/21 0500 05/18/21 0433 05/19/21 0451 05/20/21 0500  AST 23  --   --   --   ALT 18  --   --   --   ALKPHOS 108  --   --   --   BILITOT 0.2*  --   --   --   PROT 4.5*  --   --   --   ALBUMIN 1.8* 2.0* 2.1* 2.1*   No results for input(s): LIPASE, AMYLASE in the last 168 hours. No results for input(s): AMMONIA in the last 168 hours. CBC: Recent Labs  Lab 05/17/21 0500 05/18/21 0433 05/19/21 0451 05/20/21 0500 05/21/21 0455  WBC  --  7.2  --   --  7.4  HGB 8.5* 9.2* 9.3* 9.3* 9.4*  HCT 24.3* 27.2* 28.5* 27.4* 28.5*  MCV  --  98.2  --   --  100.0  PLT  --  300  --   --  397   Cardiac Enzymes: No results for input(s): CKTOTAL, CKMB, CKMBINDEX, TROPONINI in the last 168 hours. BNP: Invalid input(s): POCBNP CBG: Recent Labs  Lab 05/20/21 0625 05/20/21 1554 05/21/21 0648 05/21/21 1111 05/21/21 1636  GLUCAP 99 112* 101* 127* 116*   D-Dimer No results for input(s): DDIMER in the last 72 hours. Hgb A1c No results for input(s): HGBA1C in the last 72 hours. Lipid Profile No results for input(s): CHOL, HDL, LDLCALC, TRIG, CHOLHDL, LDLDIRECT in the last 72 hours. Thyroid function studies No results for input(s): TSH, T4TOTAL, T3FREE, THYROIDAB in the last 72 hours.  Invalid input(s): FREET3 Anemia work up No results for input(s): VITAMINB12, FOLATE, FERRITIN, TIBC, IRON, RETICCTPCT in the last 72 hours. Urinalysis    Component Value  Date/Time   COLORURINE YELLOW 05/15/2021 1300   APPEARANCEUR CLEAR 05/15/2021 1300   LABSPEC 1.008 05/15/2021 1300   PHURINE 5.0 05/15/2021 1300   GLUCOSEU NEGATIVE 05/15/2021 1300   HGBUR NEGATIVE 05/15/2021 1300   BILIRUBINUR NEGATIVE 05/15/2021 1300   BILIRUBINUR  negative 03/30/2020 1215   KETONESUR NEGATIVE 05/15/2021 1300   PROTEINUR NEGATIVE 05/15/2021 1300   UROBILINOGEN 0.2 03/30/2020 1215   NITRITE NEGATIVE 05/15/2021 1300   LEUKOCYTESUR SMALL (A) 05/15/2021 1300   Sepsis Labs Invalid input(s): PROCALCITONIN,  WBC,  LACTICIDVEN Microbiology No results found for this or any previous visit (from the past 240 hour(s)).   Patient was seen and examined on the day of discharge and was found to be in stable condition. Time coordinating discharge: 35 minutes including assessment and coordination of care, as well as examination of the patient.   SIGNED:  Dessa Phi, DO [facility] 05/22/2021, 8:29 AM

## 2021-05-22 NOTE — TOC Transition Note (Signed)
Transition of Care [facility]) - CM/SW Discharge Note   Patient Details  Name: Debbie Barry MRN: [id number] Date of Birth: [birth year]  Transition of Care [facility]) CM/SW Contact:  Zenon Mayo, RN Phone Number: 05/22/2021, 8:44 AM   Clinical Narrative:    Patient is for dc today, NCM has spoke with staff RN Janett Billow and informed her per MD patient will need to get IV Mag and her TOC meds before she leaves, PTAR has been scheduled for 12 noon.  NCM spoke with patient and we contacted her spouse to let him know she will be dc home today via PTAR.  NCM notified Bronwen Betters with [facility] as well. She will have the lifevest put on as well per Staff RN.       Barriers to Discharge: Continued Medical Work up   Patient Goals and CMS Choice Patient states their goals for this hospitalization and ongoing recovery are:: return home, does not want to go back to SNF CMS Medicare.gov Compare Post Acute Care list provided to:: Patient Choice offered to / list presented to : Patient, Spouse  Discharge Placement                       Discharge Plan and Services   Discharge Planning Services: CM Consult Post Acute Care Choice: Home Health, Durable Medical Equipment          DME Arranged: Other see comment Harrel Lemon lift) DME Agency: [employer] Date DME Agency Contacted: 05/04/21 Time DME Agency Contacted: 70 Representative spoke with at DME Agency: [place] Arranged: RN, PT, OT, Nurse's Aide, Speech Therapy, Social Work [facility] Agency: [facility] Date [place]: 05/04/21 Time [place]: [place] Representative spoke with at [place]: [place] ([place]) Interventions     Readmission Risk Interventions No flowsheet data found.

## 2021-05-22 NOTE — Care Management Important Message (Signed)
Important Message  Patient Details  Name: Debbie Barry MRN: [id number] Date of Birth: 08/14/1950   Medicare Important Message Given:  Yes     Liliani Bobo [address] 05/22/2021, 1:41 PM

## 2021-05-22 NOTE — Progress Notes (Signed)
PICC removed per order. No complications noted. Dressing applied to site, remains clean/dry/intact.

## 2021-05-23 ENCOUNTER — Telehealth: Payer: Self-pay

## 2021-05-23 NOTE — Telephone Encounter (Signed)
Transition Care Management Follow-up Telephone Call Date of discharge and from where: 05/22/2021 [place] [facility] How have you been since you were released from the hospital? Pt states slowly but surely she is doing better.  Any questions or concerns? No  Items Reviewed: Did the pt receive and understand the discharge instructions provided? Yes  Medications obtained and verified? Yes  Other? Yes  Any new allergies since your discharge? No  Dietary orders reviewed? No Do you have support at home? Yes   Home Care and Equipment/Supplies: Were home health services ordered? yes If so, what is the name of the agency? [facility]   Has the agency set up a time to come to the patient's home? yes Were any new equipment or medical supplies ordered?  No What is the name of the medical supply agency? N/a Were you able to get the supplies/equipment? not applicable Do you have any questions related to the use of the equipment or supplies? No  Functional Questionnaire: (I = Independent and D = Dependent) ADLs: d  Bathing/Dressing- d  Meal Prep- d  Eating- d  Maintaining continence- d  Transferring/Ambulation- d  Managing Meds- d  Follow up appointments reviewed:  PCP Hospital f/u appt confirmed? Yes  Scheduled to see raman ghumman  on 05/31/2021 @ [facility]. [place] f/u appt confirmed? No  Scheduled to see n/a on n/a  @ n/a. Are transportation arrangements needed? No  If their condition worsens, is the pt aware to call PCP or go to the Emergency Dept.? Yes Was the patient provided with contact information for the PCP's office or ED? Yes Was to pt encouraged to call back with questions or concerns? Yes

## 2021-05-23 NOTE — Telephone Encounter (Signed)
Transition Care Management Unsuccessful Follow-up Telephone Call  Date of discharge and from where:  05/22/2021 Debbie Barry  Attempts:  2nd Attempt  Reason for unsuccessful TCM follow-up call:  Left voice message

## 2021-05-23 NOTE — Telephone Encounter (Signed)
Transition Care Management Unsuccessful Follow-up Telephone Call  Date of discharge and from where:  [facility]  Attempts:  1st Attempt  Reason for unsuccessful TCM follow-up call:  Left voice message

## 2021-05-25 ENCOUNTER — Other Ambulatory Visit (HOSPITAL_COMMUNITY): Payer: Self-pay

## 2021-05-29 ENCOUNTER — Encounter: Payer: Self-pay | Admitting: Internal Medicine

## 2021-05-30 ENCOUNTER — Ambulatory Visit (INDEPENDENT_AMBULATORY_CARE_PROVIDER_SITE_OTHER): Payer: Medicare HMO

## 2021-05-30 VITALS — Ht 61.0 in | Wt 120.0 lb

## 2021-05-30 DIAGNOSIS — Z0000 Encounter for general adult medical examination without abnormal findings: Secondary | ICD-10-CM

## 2021-05-30 NOTE — Progress Notes (Signed)
I connected with Debbie Barry today by telephone and verified that I am speaking with the correct person using two identifiers. Location patient: home Location provider: work Persons participating in the virtual visit: Lorey Pallett, Glenna Durand LPN.   I discussed the limitations, risks, security and privacy concerns of performing an evaluation and management service by telephone and the availability of in person appointments. I also discussed with the patient that there may be a patient responsible charge related to this service. The patient expressed understanding and verbally consented to this telephonic visit.    Interactive audio and video telecommunications were attempted between this provider and patient, however failed, due to patient having technical difficulties OR patient did not have access to video capability.  We continued and completed visit with audio only.     Vital signs may be patient reported or missing.  Subjective:   Debbie Barry is a 70 y.o. female who presents for Medicare Annual (Subsequent) preventive examination.  Review of Systems     Cardiac Risk Factors include: advanced age (>62mn, >>91women);hypertension     Objective:    Today's Vitals   05/30/21 1042  Weight: 120 lb (54.4 kg)  Height: _0  (1.549 m)   Body mass index is 22.67 kg/m.  Advanced Directives 05/30/2021 05/10/2021 05/02/2021 04/30/2021 04/19/2021 03/31/2021 08/14/2020  Does Patient Have a Medical Advance Directive? No - _1   Would patient like information on creating a medical advance directive? - No - Patient declined - No - Patient declined No - Patient declined No - Patient declined Yes (Inpatient - patient defers creating a medical advance directive at this time - Information given)    Current Medications (verified) Outpatient Encounter Medications as of 05/30/2021  Medication Sig   acetaminophen (TYLENOL) 325 MG tablet Take 650 mg by mouth every 6 (six) hours  as needed for moderate pain.   apixaban (ELIQUIS) 5 MG TABS tablet Take 1 tablet (5 mg total) by mouth 2 (two) times daily.   bismuth subsalicylate (PEPTO BISMOL) 262 MG/15ML suspension Take 30 mLs by mouth every 6 (six) hours as needed for indigestion.   calcium carbonate (TUMS EX) 750 MG chewable tablet Chew 1 tablet by mouth daily as needed for heartburn.   Cholecalciferol (VITAMIN D3) 5000 units CAPS Take 5,000 Units by mouth daily.   feeding supplement (BOOST / RESOURCE BREEZE) LIQD Take 1 Container by mouth 3 (three) times daily between meals.   hydrOXYzine (VISTARIL) 25 MG capsule Take 1 capsule (25 mg total) by mouth 2 (two) times daily as needed. (Patient taking differently: Take 25 mg by mouth 2 (two) times daily as needed for anxiety.)   losartan (COZAAR) 25 MG tablet Take 1 tablet (25 mg total) by mouth daily.   Magnesium Hydroxide (MILK OF MAGNESIA PO) Take 30 mLs by mouth daily as needed (constipation).   magnesium oxide (MAG-OX) 400 MG tablet Take 1 tablet (400 mg total) by mouth daily.   Multiple Vitamin (MULTIVITAMIN WITH MINERALS) TABS tablet Take 1 tablet by mouth daily.   nadolol (CORGARD) 20 MG tablet Take 1 tablet (20 mg total) by mouth daily.   pantoprazole (PROTONIX) 40 MG tablet Take 1 tablet (40 mg total) by mouth daily.   potassium chloride SA (KLOR-CON) 20 MEQ tablet Take 1 tablet (20 mEq total) by mouth 2 (two) times daily after a meal.   sertraline (ZOLOFT) 25 MG tablet Take 1 tablet (25 mg total) by mouth daily.   spironolactone (ALDACTONE) 25  MG tablet Take 1 tablet (25 mg total) by mouth daily.   sucralfate (CARAFATE) 1 g tablet Take 1 tablet (1 g total) by mouth 2 (two) times daily.   Tetrahydrozoline HCl (VISINE OP) Place 2 drops into both eyes daily as needed (for dry/irritated eyes).    torsemide (DEMADEX) 20 MG tablet Take 2 tablets (40 mg total) by mouth 2 (two) times daily.   feeding supplement (ENSURE ENLIVE / ENSURE PLUS) LIQD Take 237 mLs by mouth 3  (three) times daily between meals. (Patient not taking: Reported on 05/03/2021)   ondansetron (ZOFRAN ODT) 4 MG disintegrating tablet Take 1 tablet (4 mg total) by mouth every 8 (eight) hours as needed for nausea or vomiting. (Patient not taking: Reported on 05/30/2021)   No facility-administered encounter medications on file as of 05/30/2021.    Allergies (verified) Patient has no known allergies.   History: Past Medical History:  Diagnosis Date   Fatty liver    GERD (gastroesophageal reflux disease)    Hypertension    Osteomyelitis ([place])    right third toe   Peripheral vascular disease ([place])    Ulcer 08/14/20   Past Surgical History:  Procedure Laterality Date   AMPUTATION Right 08/08/2017   Procedure: AMPUTATION RIGHT 3RD TOE;  Surgeon: Newt Minion, MD;  Location: [facility];  Service: Orthopedics;  Laterality: Right;   BIOPSY  08/14/2020   Procedure: BIOPSY;  Surgeon: Clarene Essex, MD;  Location: [facility];  Service: Endoscopy;;   Bunionectomy Right 2006     Right   Bunionectony Left 2006      August   COLONOSCOPY N/A 01/21/2014   Procedure: COLONOSCOPY;  Surgeon: Beryle Beams, MD;  Location: [facility];  Service: Endoscopy;  Laterality: N/A;   ESOPHAGOGASTRODUODENOSCOPY (EGD) WITH PROPOFOL N/A 08/14/2020   Procedure: ESOPHAGOGASTRODUODENOSCOPY (EGD) WITH PROPOFOL;  Surgeon: Clarene Essex, MD;  Location: [facility];  Service: Endoscopy;  Laterality: N/A;   IR THORACENTESIS ASP PLEURAL SPACE W/IMG GUIDE  05/03/2021   RIGHT/LEFT HEART CATH AND CORONARY ANGIOGRAPHY N/A 05/11/2021   Procedure: RIGHT/LEFT HEART CATH AND CORONARY ANGIOGRAPHY;  Surgeon: Troy Sine, MD;  Location: [place] [facility];  Service: Cardiovascular;  Laterality: N/A;   TUBAL LIGATION     WISDOM TOOTH EXTRACTION     Family History  Problem Relation Age of Onset   Breast cancer Mother    Colon cancer Mother    Heart Problems Mother    Hypertension Mother    Multiple myeloma Mother    Arthritis  Mother    Cancer Mother    Heart disease Mother    Hypertension Father    Heart disease Father    Multiple myeloma Father    Arthritis Father    Cancer Father    Social History   Socioeconomic History   Marital status: Married    Spouse name: Not on file   Number of children: Not on file   Years of education: Not on file   Highest education level: Not on file  Occupational History   Not on file  Tobacco Use   Smoking status: Never   Smokeless tobacco: Never  Vaping Use   Vaping Use: Never used  Substance and Sexual Activity   Alcohol use: Not Currently    Comment: social glass of wine   Drug use: No   Sexual activity: Yes    Birth control/protection: Post-menopausal  Other Topics Concern   Not on file  Social History Narrative   Not  on file   Social Determinants of Health   Financial Resource Strain: Low Risk    Difficulty of Paying Living Expenses: Not hard at all  Food Insecurity: No Food Insecurity   Worried About Charity fundraiser in the Last Year: Never true   Arboriculturist in the Last Year: Never true  Transportation Needs: No Transportation Needs   Lack of Transportation (Medical): No   Lack of Transportation (Non-Medical): No  Physical Activity: Inactive   Days of Exercise per Week: 0 days   Minutes of Exercise per Session: 0 min  Stress: Stress Concern Present   Feeling of Stress : To some extent  Social Connections: Not on file    Tobacco Counseling Counseling given: Not Answered   Clinical Intake:  Pre-visit preparation completed: Yes  Pain : No/denies pain     Nutritional Status: BMI of 19-24  Normal Nutritional Risks: None Diabetes: No  How often do you need to have someone help you when you read instructions, pamphlets, or other written materials from your doctor or pharmacy?: 1 - Never What is the last grade level you completed in school?: 12th grade  Diabetic? no  Interpreter Needed?: No  Information entered by :: NAllen  LPN   Activities of Daily Living In your present state of health, do you have any difficulty performing the following activities: 05/30/2021 05/10/2021  Hearing? N N  Vision? N N  Difficulty concentrating or making decisions? N N  Walking or climbing stairs? Y Y  Comment - -  Dressing or bathing? Y Y  Comment - -  Doing errands, shopping? Tempie Donning  Preparing Food and eating ? Y -  Using the Toilet? Y -  In the past six months, have you accidently leaked urine? Y -  Comment wears depends -  Do you have problems with loss of bowel control? N -  Managing your Medications? Y -  Comment husband manages -  Managing your Finances? Y -  Housekeeping or managing your Housekeeping? Y -  Some recent data might be hidden    Patient Care Team: Glendale Chard, MD as PCP - General (Internal Medicine) O'Neal, Cassie Freer, MD as PCP - Cardiology (Cardiology) Magrinat, Virgie Dad, MD as Consulting Physician (Oncology) Clarene Essex, MD as Consulting Physician (Gastroenterology)  Indicate any recent Medical Services you may have received from other than [facility] providers in the past year (date may be approximate).     Assessment:   This is a routine wellness examination for [place].  Hearing/Vision screen Vision Screening - Comments:: Regular eye exams, [facility]  Dietary issues and exercise activities discussed: Current Exercise Habits: The patient does not participate in regular exercise at present   Goals Addressed             This Visit's Progress    Patient Stated       05/30/2021, strengthen legs       Depression Screen PHQ 2/9 Scores 05/30/2021 04/19/2021 03/30/2020 03/23/2019 11/18/2018  PHQ - 2 Score 0 3 0 0 0  PHQ- 9 Score - 6 - - -    Fall Risk Fall Risk  05/30/2021 03/30/2020 03/23/2019 11/18/2018  Falls in the past year? 1 1 0 0  Comment legs give out lost balance, bent over to pick something up, going to the bathroom, feel in the bath tub water was too hot, - -  Number  falls in past yr: 1 1 - -  Injury with Fall? 0 0 - -  Risk for fall due to : Impaired balance/gait;Impaired mobility;Medication side effect;History of fall(s) Impaired balance/gait;Medication side effect - -  Follow up Falls evaluation completed;Education provided;Falls prevention discussed Falls evaluation completed;Education provided;Falls prevention discussed - -    FALL RISK PREVENTION PERTAINING TO THE HOME:  Any stairs in or around the home? No  If so, are there any without handrails? N/a Home free of loose throw rugs in walkways, pet beds, electrical cords, etc? Yes  Adequate lighting in your home to reduce risk of falls? Yes   ASSISTIVE DEVICES UTILIZED TO PREVENT FALLS:  Life alert? No  Use of a cane, walker or w/c? Yes  Grab bars in the bathroom? Yes  Shower chair or bench in shower? Yes  Elevated toilet seat or a handicapped toilet? Yes   TIMED UP AND GO:  Was the test performed? No .      Cognitive Function:     6CIT Screen 05/30/2021 03/30/2020 03/23/2019  What Year? 0 points 0 points 0 points  What month? 0 points 0 points 0 points  What time? 0 points 0 points 0 points  Count back from 20 0 points 0 points 0 points  Months in reverse 0 points 0 points 0 points  Repeat phrase 8 points 2 points 0 points  Total Score 8 2 0    Immunizations Immunization History  Administered Date(s) Administered   Influenza Split 07/11/2014   Influenza, High Dose Seasonal PF 03/23/2019   Influenza, Quadrivalent, Recombinant, Inj, Pf 03/30/2018   Influenza,inj,Quad PF,6+ Mos 03/23/2017   Influenza-Unspecified 03/30/2018   PFIZER(Purple Top)SARS-COV-2 Vaccination 09/16/2019, 10/07/2019   Pneumococcal Polysaccharide-23 03/23/2019   Zoster Recombinat (Shingrix) 05/28/2019, 08/03/2019    TDAP status: Up to date  Flu Vaccine status: Due, Education has been provided regarding the importance of this vaccine. Advised may receive this vaccine at local pharmacy or Health Dept.  Aware to provide a copy of the vaccination record if obtained from local pharmacy or Health Dept. Verbalized acceptance and understanding.  Pneumococcal vaccine status: Due, Education has been provided regarding the importance of this vaccine. Advised may receive this vaccine at local pharmacy or Health Dept. Aware to provide a copy of the vaccination record if obtained from local pharmacy or Health Dept. Verbalized acceptance and understanding.  Covid-19 vaccine status: Completed vaccines  Qualifies for Shingles Vaccine? Yes   Zostavax completed No   Shingrix Completed?: Yes  Screening Tests Health Maintenance  Topic Date Due   COVID-19 Vaccine (3 - Pfizer risk series) 11/04/2019   Pneumonia Vaccine 65+ Years old (2 - PCV) 03/22/2020   INFLUENZA VACCINE  01/29/2021   MAMMOGRAM  12/14/2022   COLONOSCOPY (Pts 45-65yr Insurance coverage will need to be confirmed)  01/22/2024   TETANUS/TDAP  10/15/2025   DEXA SCAN  Completed   Hepatitis C Screening  Completed   Zoster Vaccines- Shingrix  Completed   HPV VACCINES  Aged Out    Health Maintenance  Health Maintenance Due  Topic Date Due   COVID-19 Vaccine (3 - Pfizer risk series) 11/04/2019   Pneumonia Vaccine 68 Years old (2 - PCV) 03/22/2020   INFLUENZA VACCINE  01/29/2021    Colorectal cancer screening: Type of screening: FOBT/FIT. Completed 05/15/2021. Repeat every 1 years  Mammogram status: Completed 12/13/2020. Repeat every year  Bone Density status: Completed 02/07/2020.   Lung Cancer Screening: (Low Dose CT Chest recommended if Age 68-80years, 30 pack-year currently smoking OR have quit w/in 15years.) does not qualify.   Lung Cancer Screening  Referral: no  Additional Screening:  Hepatitis C Screening: does qualify; Completed 12/30/2012  Vision Screening: Recommended annual ophthalmology exams for early detection of glaucoma and other disorders of the eye. Is the patient up to date with their annual eye exam?  Yes  Who  is the provider or what is the name of the office in which the patient attends annual eye exams? [facility] If pt is not established with a provider, would they like to be referred to a provider to establish care? No .   Dental Screening: Recommended annual dental exams for proper oral hygiene  Community Resource Referral / Chronic Care Management: CRR required this visit?  No   CCM required this visit?  No      Plan:     I have personally reviewed and noted the following in the patient's chart:   Medical and social history Use of alcohol, tobacco or illicit drugs  Current medications and supplements including opioid prescriptions.  Functional ability and status Nutritional status Physical activity Advanced directives List of other physicians Hospitalizations, surgeries, and ER visits in previous 12 months Vitals Screenings to include cognitive, depression, and falls Referrals and appointments  In addition, I have reviewed and discussed with patient certain preventive protocols, quality metrics, and best practice recommendations. A written personalized care plan for preventive services as well as general preventive health recommendations were provided to patient.     Kellie Simmering, LPN   [id number]   Nurse Notes: none

## 2021-05-30 NOTE — Patient Instructions (Signed)
Ms. Apps , Thank you for taking time to come for your Medicare Wellness Visit. I appreciate your ongoing commitment to your health goals. Please review the following plan we discussed and let me know if I can assist you in the future.   Screening recommendations/referrals: Colonoscopy: FOBT 05/15/2021 Mammogram: completed 12/13/2020 Bone Density: completed 02/07/2020 Recommended yearly ophthalmology/optometry visit for glaucoma screening and checkup Recommended yearly dental visit for hygiene and checkup  Vaccinations: Influenza vaccine: due Pneumococcal vaccine: due Tdap vaccine: completed 10/16/2015, due 10/15/2025 Shingles vaccine: completed   Covid-19: 10/07/2019, 09/16/2019  Advanced directives: Advance directive discussed with you today.   Conditions/risks identified: none  Next appointment: Follow up in one year for your annual wellness visit    Preventive Care 65 Years and Older, Female Preventive care refers to lifestyle choices and visits with your health care provider that can promote health and wellness. What does preventive care include? A yearly physical exam. This is also called an annual well check. Dental exams once or twice a year. Routine eye exams. Ask your health care provider how often you should have your eyes checked. Personal lifestyle choices, including: Daily care of your teeth and gums. Regular physical activity. Eating a healthy diet. Avoiding tobacco and drug use. Limiting alcohol use. Practicing safe sex. Taking low-dose aspirin every day. Taking vitamin and mineral supplements as recommended by your health care provider. What happens during an annual well check? The services and screenings done by your health care provider during your annual well check will depend on your age, overall health, lifestyle risk factors, and family history of disease. Counseling  Your health care provider may ask you questions about your: Alcohol use. Tobacco  use. Drug use. Emotional well-being. Home and relationship well-being. Sexual activity. Eating habits. History of falls. Memory and ability to understand (cognition). Work and work Statistician. Reproductive health. Screening  You may have the following tests or measurements: Height, weight, and BMI. Blood pressure. Lipid and cholesterol levels. These may be checked every 5 years, or more frequently if you are over 76 years old. Skin check. Lung cancer screening. You may have this screening every year starting at age 69 if you have a 30-pack-year history of smoking and currently smoke or have quit within the past 15 years. Fecal occult blood test (FOBT) of the stool. You may have this test every year starting at age 24. Flexible sigmoidoscopy or colonoscopy. You may have a sigmoidoscopy every 5 years or a colonoscopy every 10 years starting at age 77. Hepatitis C blood test. Hepatitis B blood test. Sexually transmitted disease (STD) testing. Diabetes screening. This is done by checking your blood sugar (glucose) after you have not eaten for a while (fasting). You may have this done every 1-3 years. Bone density scan. This is done to screen for osteoporosis. You may have this done starting at age 61. Mammogram. This may be done every 1-2 years. Talk to your health care provider about how often you should have regular mammograms. Talk with your health care provider about your test results, treatment options, and if necessary, the need for more tests. Vaccines  Your health care provider may recommend certain vaccines, such as: Influenza vaccine. This is recommended every year. Tetanus, diphtheria, and acellular pertussis (Tdap, Td) vaccine. You may need a Td booster every 10 years. Zoster vaccine. You may need this after age 44. Pneumococcal 13-valent conjugate (PCV13) vaccine. One dose is recommended after age 17. Pneumococcal polysaccharide (PPSV23) vaccine. One dose is recommended  after age 80. Talk to your health care provider about which screenings and vaccines you need and how often you need them. This information is not intended to replace advice given to you by your health care provider. Make sure you discuss any questions you have with your health care provider. Document Released: 07/14/2015 Document Revised: 03/06/2016 Document Reviewed: 04/18/2015 Elsevier Interactive Patient Education  2017 [employer] Prevention in the Home Falls can cause injuries. They can happen to people of all ages. There are many things you can do to make your home safe and to help prevent falls. What can I do on the outside of my home? Regularly fix the edges of walkways and driveways and fix any cracks. Remove anything that might make you trip as you walk through a door, such as a raised step or threshold. Trim any bushes or trees on the path to your home. Use bright outdoor lighting. Clear any walking paths of anything that might make someone trip, such as rocks or tools. Regularly check to see if handrails are loose or broken. Make sure that both sides of any steps have handrails. Any raised decks and porches should have guardrails on the edges. Have any leaves, snow, or ice cleared regularly. Use sand or salt on walking paths during winter. Clean up any spills in your garage right away. This includes oil or grease spills. What can I do in the bathroom? Use night lights. Install grab bars by the toilet and in the tub and shower. Do not use towel bars as grab bars. Use non-skid mats or decals in the tub or shower. If you need to sit down in the shower, use a plastic, non-slip stool. Keep the floor dry. Clean up any water that spills on the floor as soon as it happens. Remove soap buildup in the tub or shower regularly. Attach bath mats securely with double-sided non-slip rug tape. Do not have throw rugs and other things on the floor that can make you trip. What can I do  in the bedroom? Use night lights. Make sure that you have a light by your bed that is easy to reach. Do not use any sheets or blankets that are too big for your bed. They should not hang down onto the floor. Have a firm chair that has side arms. You can use this for support while you get dressed. Do not have throw rugs and other things on the floor that can make you trip. What can I do in the kitchen? Clean up any spills right away. Avoid walking on wet floors. Keep items that you use a lot in easy-to-reach places. If you need to reach something above you, use a strong step stool that has a grab bar. Keep electrical cords out of the way. Do not use floor polish or wax that makes floors slippery. If you must use wax, use non-skid floor wax. Do not have throw rugs and other things on the floor that can make you trip. What can I do with my stairs? Do not leave any items on the stairs. Make sure that there are handrails on both sides of the stairs and use them. Fix handrails that are broken or loose. Make sure that handrails are as long as the stairways. Check any carpeting to make sure that it is firmly attached to the stairs. Fix any carpet that is loose or worn. Avoid having throw rugs at the top or bottom of the stairs. If you do  have throw rugs, attach them to the floor with carpet tape. Make sure that you have a light switch at the top of the stairs and the bottom of the stairs. If you do not have them, ask someone to add them for you. What else can I do to help prevent falls? Wear shoes that: Do not have high heels. Have rubber bottoms. Are comfortable and fit you well. Are closed at the toe. Do not wear sandals. If you use a stepladder: Make sure that it is fully opened. Do not climb a closed stepladder. Make sure that both sides of the stepladder are locked into place. Ask someone to hold it for you, if possible. Clearly mark and make sure that you can see: Any grab bars or  handrails. First and last steps. Where the edge of each step is. Use tools that help you move around (mobility aids) if they are needed. These include: Canes. Walkers. Scooters. Crutches. Turn on the lights when you go into a dark area. Replace any light bulbs as soon as they burn out. Set up your furniture so you have a clear path. Avoid moving your furniture around. If any of your floors are uneven, fix them. If there are any pets around you, be aware of where they are. Review your medicines with your doctor. Some medicines can make you feel dizzy. This can increase your chance of falling. Ask your doctor what other things that you can do to help prevent falls. This information is not intended to replace advice given to you by your health care provider. Make sure you discuss any questions you have with your health care provider. Document Released: 04/13/2009 Document Revised: 11/23/2015 Document Reviewed: 07/22/2014 Elsevier Interactive Patient Education  2017 [employer].

## 2021-05-31 ENCOUNTER — Encounter: Payer: Self-pay | Admitting: Nurse Practitioner

## 2021-05-31 ENCOUNTER — Telehealth (INDEPENDENT_AMBULATORY_CARE_PROVIDER_SITE_OTHER): Payer: Medicare HMO | Admitting: Nurse Practitioner

## 2021-05-31 VITALS — BP 108/72 | HR 84 | Ht 61.0 in | Wt 118.0 lb

## 2021-05-31 DIAGNOSIS — M48061 Spinal stenosis, lumbar region without neurogenic claudication: Secondary | ICD-10-CM

## 2021-05-31 DIAGNOSIS — L299 Pruritus, unspecified: Secondary | ICD-10-CM | POA: Diagnosis not present

## 2021-05-31 DIAGNOSIS — I469 Cardiac arrest, cause unspecified: Secondary | ICD-10-CM

## 2021-05-31 DIAGNOSIS — I82402 Acute embolism and thrombosis of unspecified deep veins of left lower extremity: Secondary | ICD-10-CM

## 2021-05-31 DIAGNOSIS — I5043 Acute on chronic combined systolic (congestive) and diastolic (congestive) heart failure: Secondary | ICD-10-CM | POA: Diagnosis not present

## 2021-05-31 DIAGNOSIS — I428 Other cardiomyopathies: Secondary | ICD-10-CM | POA: Diagnosis not present

## 2021-05-31 MED ORDER — HYDROXYZINE HCL 10 MG PO TABS: 10.0000 mg | ORAL_TABLET | Freq: Three times a day (TID) | ORAL | 0 refills | Status: DC | PRN

## 2021-05-31 NOTE — Progress Notes (Signed)
Virtual Visit via video call.    This visit type was conducted due to national recommendations for restrictions regarding the COVID-19 Pandemic (e.g. social distancing) in an effort to limit this patient's exposure and mitigate transmission in our community.  Due to her co-morbid illnesses, this patient is at least at moderate risk for complications without adequate follow up.  This format is felt to be most appropriate for this patient at this time.  All issues noted in this document were discussed and addressed.  A limited physical exam was performed with this format.    This visit type was conducted due to national recommendations for restrictions regarding the COVID-19 Pandemic (e.g. social distancing) in an effort to limit this patient's exposure and mitigate transmission in our community.  Patients identity confirmed using two different identifiers.  This format is felt to be most appropriate for this patient at this time.  All issues noted in this document were discussed and addressed.  No physical exam was performed (except for noted visual exam findings with Video Visits).    Date:  05/31/2021   ID:  Debbie Barry, Debbie Barry 1951/04/18, MRN [id number]  Patient Location:  Home   Provider location:   Office  Chief Complaint:  Hospital follow up   History of Present Illness:    Debbie Barry is a 70 y.o. female who presents via video conferencing for a telehealth visit today.    The patient does not have symptoms concerning for COVID-19 infection (fever, chills, cough, or new shortness of breath).   She is on video call for a hospital follow up. She had chest pain at the facility and then took her to [facility] and that is where she had the heart attack. Dr. Lovena Le cardiology. She also had blood clot in the legs. She is on the eliquis for the clot twice daily.      Past Medical History:  Diagnosis Date   Fatty liver    GERD (gastroesophageal reflux disease)     Hypertension    Osteomyelitis ([place])    right third toe   Peripheral vascular disease ([place])    Ulcer 08/14/20   Past Surgical History:  Procedure Laterality Date   AMPUTATION Right 08/08/2017   Procedure: AMPUTATION RIGHT 3RD TOE;  Surgeon: Newt Minion, MD;  Location: [place];  Service: Orthopedics;  Laterality: Right;   BIOPSY  08/14/2020   Procedure: BIOPSY;  Surgeon: Clarene Essex, MD;  Location: [facility];  Service: Endoscopy;;   Bunionectomy Right 2006     Right   Bunionectony Left 2006      August   COLONOSCOPY N/A 01/21/2014   Procedure: COLONOSCOPY;  Surgeon: Beryle Beams, MD;  Location: [facility];  Service: Endoscopy;  Laterality: N/A;   ESOPHAGOGASTRODUODENOSCOPY (EGD) WITH PROPOFOL N/A 08/14/2020   Procedure: ESOPHAGOGASTRODUODENOSCOPY (EGD) WITH PROPOFOL;  Surgeon: Clarene Essex, MD;  Location: [facility];  Service: Endoscopy;  Laterality: N/A;   IR THORACENTESIS ASP PLEURAL SPACE W/IMG GUIDE  05/03/2021   RIGHT/LEFT HEART CATH AND CORONARY ANGIOGRAPHY N/A 05/11/2021   Procedure: RIGHT/LEFT HEART CATH AND CORONARY ANGIOGRAPHY;  Surgeon: Troy Sine, MD;  Location: [facility];  Service: Cardiovascular;  Laterality: N/A;   TUBAL LIGATION     WISDOM TOOTH EXTRACTION       Current Meds  Medication Sig   acetaminophen (TYLENOL) 325 MG tablet Take 650 mg by mouth every 6 (six) hours as needed for moderate pain.   apixaban (ELIQUIS) 5  MG TABS tablet Take 1 tablet (5 mg total) by mouth 2 (two) times daily.   bismuth subsalicylate (PEPTO BISMOL) 262 MG/15ML suspension Take 30 mLs by mouth every 6 (six) hours as needed for indigestion.   calcium carbonate (TUMS EX) 750 MG chewable tablet Chew 1 tablet by mouth daily as needed for heartburn.   Cholecalciferol (VITAMIN D3) 5000 units CAPS Take 5,000 Units by mouth daily.   feeding supplement (BOOST / RESOURCE BREEZE) LIQD Take 1 Container by mouth 3 (three) times daily between meals.   hydrOXYzine (VISTARIL) 25 MG  capsule Take 1 capsule (25 mg total) by mouth 2 (two) times daily as needed. (Patient taking differently: Take 25 mg by mouth 2 (two) times daily as needed for anxiety.)   losartan (COZAAR) 25 MG tablet Take 1 tablet (25 mg total) by mouth daily.   magnesium oxide (MAG-OX) 400 MG tablet Take 1 tablet (400 mg total) by mouth daily.   Multiple Vitamin (MULTIVITAMIN WITH MINERALS) TABS tablet Take 1 tablet by mouth daily.   nadolol (CORGARD) 20 MG tablet Take 1 tablet (20 mg total) by mouth daily.   ondansetron (ZOFRAN ODT) 4 MG disintegrating tablet Take 1 tablet (4 mg total) by mouth every 8 (eight) hours as needed for nausea or vomiting.   pantoprazole (PROTONIX) 40 MG tablet Take 1 tablet (40 mg total) by mouth daily.   potassium chloride SA (KLOR-CON) 20 MEQ tablet Take 1 tablet (20 mEq total) by mouth 2 (two) times daily after a meal.   sertraline (ZOLOFT) 25 MG tablet Take 1 tablet (25 mg total) by mouth daily.   spironolactone (ALDACTONE) 25 MG tablet Take 1 tablet (25 mg total) by mouth daily.   sucralfate (CARAFATE) 1 g tablet Take 1 tablet (1 g total) by mouth 2 (two) times daily.   Tetrahydrozoline HCl (VISINE OP) Place 2 drops into both eyes daily as needed (for dry/irritated eyes).    torsemide (DEMADEX) 20 MG tablet Take 2 tablets (40 mg total) by mouth 2 (two) times daily.     Allergies:   Patient has no known allergies.   Social History   Tobacco Use   Smoking status: Never   Smokeless tobacco: Never  Vaping Use   Vaping Use: Never used  Substance Use Topics   Alcohol use: Not Currently    Comment: social glass of wine   Drug use: No     Family Hx: The patient's family history includes Arthritis in her father and mother; Breast cancer in her mother; Cancer in her father and mother; Colon cancer in her mother; Heart Problems in her mother; Heart disease in her father and mother; Hypertension in her father and mother; Multiple myeloma in her father and mother.  ROS:    Please see the history of present illness.    Review of Systems  Constitutional:  Negative for chills and fever.  HENT:  Negative for congestion.   Respiratory:  Negative for cough.   Cardiovascular:  Negative for chest pain and palpitations.  Neurological:  Positive for weakness. Negative for headaches.   All other systems reviewed and are negative.   Labs/Other Tests and Data Reviewed:    Recent Labs: 05/09/2021: TSH 4.440 05/17/2021: ALT 18 05/18/2021: B Natriuretic Peptide 431.3 05/21/2021: Hemoglobin 9.4; Platelets 397 05/22/2021: BUN 38; Creatinine, Ser 0.95; Magnesium 1.7; Potassium 3.7; Sodium 136   Recent Lipid Panel Lab Results  Component Value Date/Time   CHOL 146 08/22/2020 04:04 PM   TRIG 123 05/14/2021 05:54  AM   HDL 53 08/22/2020 04:04 PM   CHOLHDL 2.8 08/22/2020 04:04 PM   LDLCALC 77 08/22/2020 04:04 PM    Wt Readings from Last 3 Encounters:  05/30/21 120 lb (54.4 kg)  05/22/21 117 lb 8.1 oz (53.3 kg)  05/02/21 144 lb 12.8 oz (65.7 kg)     Exam:    Vital Signs:  There were no vitals taken for this visit.    Physical Exam Vitals and nursing note reviewed.  HENT:     Head: Normocephalic and atraumatic.  Pulmonary:     Effort: Pulmonary effort is normal.  Neurological:     Mental Status: She is alert and oriented to person, place, and time.  Psychiatric:        Mood and Affect: Affect normal.    ASSESSMENT & PLAN:    1. Cardiac arrest ([place]) -Pt. Was admitted to the hospital. Martin Majestic into V.fib on 11/7. Did CPR went into pulsatile VT. Was in ICU placed on TPN on 11/9. Taken off TPN. Currently at home stable.   2. Nonischemic cardiomyopathy ([place]) -Stable, is followed by cardiologist; has appt in Jan. 2023 -Advised patient to go to the ED with any chest pain, SOB.   3. Itching - hydrOXYzine (ATARAX) 10 MG tablet; Take 1 tablet (10 mg total) by mouth 3 (three) times daily as needed.  Dispense: 30 tablet; Refill: 0  4. Deep vein thrombosis (DVT)  of left lower extremity, unspecified chronicity, unspecified vein (HCC) -Pt. Was started on eliquis. Continues to take it BID   5. Spinal stenosis of lumbar region, unspecified whether neurogenic claudication present -Followed by neurologist.   6. Acute on chronic combined systolic and diastolic CHF (congestive heart failure) ([place]) -She is followed by cardiology -On Torsemide, losartan and aldactone.  -Advised pt. If she has a 3 pd weight gain in 24 hours or 5 pds in 1 week, experience SOB, swelling in hands, feet or stomach call the doc or go to the ED.    The patient was encouraged to call or send a message through [place] for any questions or concerns.   Follow up: if symptoms persist or do not get better.   Side effects and appropriate use of all the medication(s) were discussed with the patient today. Patient advised to use the medication(s) as directed by their healthcare provider. The patient was encouraged to read, review, and understand all associated package inserts and contact our office with any questions or concerns. The patient accepts the risks of the treatment plan and had an opportunity to ask questions.   Patient was given opportunity to ask questions. Patient verbalized understanding of the plan and was able to repeat key elements of the plan. All questions were answered to their satisfaction.  Raman Ghumman, DNP   I, Raman Ghumman have reviewed all documentation for this visit. The documentation on 11/09/20 for the exam, diagnosis, procedures, and orders are all accurate and complete.   COVID-19 Education: The signs and symptoms of COVID-19 were discussed with the patient and how to seek care for testing (follow up with PCP or arrange E-visit).  The importance of social distancing was discussed today.  Patient Risk:   After full review of this patients clinical status, I feel that they are at least moderate risk at this time.  Time:   Today, I have spent 15 minutes/  seconds with the patient with telehealth technology discussing above diagnoses.     Medication Adjustments/Labs and Tests Ordered: Current medicines are  reviewed at length with the patient today.  Concerns regarding medicines are outlined above.   Tests Ordered: No orders of the defined types were placed in this encounter.   Medication Changes: No orders of the defined types were placed in this encounter.   Disposition:  Follow up prn  Signed, Sheppard Evens [place], CMA

## 2021-06-04 ENCOUNTER — Telehealth: Payer: Self-pay

## 2021-06-04 ENCOUNTER — Other Ambulatory Visit (HOSPITAL_COMMUNITY): Payer: Self-pay

## 2021-06-04 NOTE — Telephone Encounter (Signed)
Patient notified that her forms have been completed and faxed. I have also placed a copy in the mail for her as she requested. YL,RMA

## 2021-06-05 ENCOUNTER — Telehealth: Payer: Self-pay

## 2021-06-05 NOTE — Telephone Encounter (Signed)
Attempted to call pt to advise her to stop taking pepto bismol and to see what she is taking hydroxyzine per Dr.Sanders. I was unable to leave a vm YL,RMA

## 2021-06-07 ENCOUNTER — Telehealth: Payer: Self-pay | Admitting: *Deleted

## 2021-06-07 NOTE — Chronic Care Management (AMB) (Signed)
  Chronic Care Management   Outreach Note  06/07/2021 Name: Lucianne Smestad MRN: [id number] DOB: [birth year]  Debbie Barry is a 70 y.o. year old female who is a primary care patient of Glendale Chard, MD. I reached out to Kittie Plater by phone today in response to a referral sent by Ms. Danise Mina Barrantes's primary care provider.  An unsuccessful telephone outreach was attempted today. The patient was referred to the case management team for assistance with care management and care coordination.   Follow Up Plan: A HIPAA compliant phone message was left for the patient providing contact information and requesting a return call.  The care management team will reach out to the patient again over the next 1 days.  If patient returns call to provider office, please advise to call [place]* at [phone].*  [place] Management  Direct Dial: [phone]

## 2021-06-08 NOTE — Chronic Care Management (AMB) (Signed)
  Chronic Care Management   Note  06/08/2021 Name: Brienne Liguori MRN: [id number] DOB: July 17, 1950  Debbie Barry is a 70 y.o. year old female who is a primary care patient of Glendale Chard, MD. I reached out to Kittie Plater by phone today in response to a referral sent by Ms. Danise Mina Shingledecker's PCP.  Ms. Kuch was given information about Chronic Care Management services today including:  CCM service includes personalized support from designated clinical staff supervised by her physician, including individualized plan of care and coordination with other care providers 24/7 contact phone numbers for assistance for urgent and routine care needs. Service will only be billed when office clinical staff spend 20 minutes or more in a month to coordinate care. Only one practitioner may furnish and bill the service in a calendar month. The patient may stop CCM services at any time (effective at the end of the month) by phone call to the office staff. The patient is responsible for co-pay (up to 20% after annual deductible is met) if co-pay is required by the individual health plan.   Patient agreed to services and verbal consent obtained.   Follow up plan: Telephone appointment with care management team member scheduled for:06/14/21  [place] Management  Direct Dial: [phone]

## 2021-06-13 ENCOUNTER — Other Ambulatory Visit: Payer: Self-pay

## 2021-06-13 DIAGNOSIS — L299 Pruritus, unspecified: Secondary | ICD-10-CM

## 2021-06-13 MED ORDER — HYDROXYZINE HCL 10 MG PO TABS: 10.0000 mg | ORAL_TABLET | Freq: Three times a day (TID) | ORAL | 0 refills | Status: DC | PRN

## 2021-06-14 ENCOUNTER — Telehealth: Payer: Medicare HMO

## 2021-06-14 ENCOUNTER — Telehealth: Payer: Self-pay

## 2021-06-14 NOTE — Telephone Encounter (Signed)
°  Care Management   Follow Up Note   06/14/2021 Name: Debbie Barry MRN: [id number] DOB: 02/11/1951   Referred by: Glendale Chard, MD Reason for referral : Chronic Care Management (Unsuccessful call)   An unsuccessful telephone outreach was attempted today. The patient was referred to the case management team for assistance with care management and care coordination. SW left a HIPAA compliant voice message requesting a return call.  Follow Up Plan: The care management team will reach out to the patient again over the next 21 days.   Daneen Schick, BSW, CDP Social Worker, Certified Dementia Practitioner [place] [facility] [place] Management [phone]

## 2021-06-15 ENCOUNTER — Telehealth: Payer: Self-pay | Admitting: *Deleted

## 2021-06-15 NOTE — Chronic Care Management (AMB) (Signed)
°  Care Management   Note  06/15/2021 Name: Coni Homesley MRN: [id number] DOB: [birth year]  Buffi Ewton is a 70 y.o. year old female who is a primary care patient of Glendale Chard, MD and is actively engaged with the care management team. I reached out to Kittie Plater by phone today to assist with re-scheduling an initial visit with the BSW  Follow up plan: Unsuccessful telephone outreach attempt made. A HIPAA compliant phone message was left for the patient providing contact information and requesting a return call.  The care management team will reach out to the patient again over the next 7 days.  If patient returns call to provider office, please advise to call [place] at [phone].  [place] Management  Direct Dial: [phone]

## 2021-06-15 NOTE — Chronic Care Management (AMB) (Signed)
°  Care Management   Note  06/15/2021 Name: Dorene Bruni MRN: [id number] DOB: 1951/02/25  Shayleen Eppinger is a 70 y.o. year old female who is a primary care patient of Glendale Chard, MD and is actively engaged with the care management team. I reached out to Kittie Plater by phone today to assist with re-scheduling an initial visit with the BSW  Follow up plan: Telephone appointment with care management team member scheduled for:07/03/21  [place] Management  Direct Dial: [phone]

## 2021-06-16 LAB — ACID FAST CULTURE WITH REFLEXED SENSITIVITIES (MYCOBACTERIA): Acid Fast Culture: NEGATIVE

## 2021-06-18 ENCOUNTER — Other Ambulatory Visit: Payer: Self-pay

## 2021-06-18 MED ORDER — LOSARTAN POTASSIUM 25 MG PO TABS: 25.0000 mg | ORAL_TABLET | Freq: Every day | ORAL | 2 refills | Status: DC

## 2021-06-18 MED ORDER — SPIRONOLACTONE 25 MG PO TABS: 25.0000 mg | ORAL_TABLET | Freq: Every day | ORAL | 2 refills | Status: DC

## 2021-06-18 MED ORDER — SUCRALFATE 1 G PO TABS: 1.0000 g | ORAL_TABLET | Freq: Two times a day (BID) | ORAL | 2 refills | Status: DC

## 2021-06-18 MED ORDER — NADOLOL 20 MG PO TABS: 20.0000 mg | ORAL_TABLET | Freq: Every day | ORAL | 2 refills | Status: DC

## 2021-06-19 ENCOUNTER — Telehealth (HOSPITAL_COMMUNITY): Payer: Self-pay

## 2021-06-19 ENCOUNTER — Other Ambulatory Visit (HOSPITAL_COMMUNITY): Payer: Self-pay

## 2021-06-19 NOTE — Telephone Encounter (Signed)
Transitions of Care Pharmacy   Call attempted for a pharmacy transitions of care follow-up. HIPAA appropriate voicemail was left with call back information provided.   Call attempt #2. Will follow-up in 2-3 days.    

## 2021-06-20 ENCOUNTER — Other Ambulatory Visit (HOSPITAL_COMMUNITY): Payer: Self-pay

## 2021-06-20 ENCOUNTER — Telehealth (HOSPITAL_COMMUNITY): Payer: Self-pay

## 2021-06-20 ENCOUNTER — Telehealth: Payer: Self-pay

## 2021-06-20 ENCOUNTER — Telehealth: Payer: Self-pay | Admitting: Internal Medicine

## 2021-06-20 MED ORDER — PROPRANOLOL HCL ER 60 MG PO CP24: 60.0000 mg | ORAL_CAPSULE | Freq: Every day | ORAL | 11 refills | Status: DC

## 2021-06-20 MED ORDER — APIXABAN 5 MG PO TABS: 5.0000 mg | ORAL_TABLET | Freq: Two times a day (BID) | ORAL | 2 refills | Status: DC

## 2021-06-20 NOTE — Telephone Encounter (Signed)
Pharmacy Transitions of Care Follow-up Telephone Call  Date of discharge: 05/22/21  Discharge Diagnosis: Cardiac arrest  How have you been since you were released from the hospital?  Patient has been experiencing bright red blood in the stool since discharge. Patient stated it started in the hospital before discharge. Encouraged patient to let Cardiologist know and that patient should go to the emergency room. Counseled patient on s/sx of anemia. No other questions about meds at this time.  Medication changes made at discharge:      START taking: Eliquis (apixaban)  magnesium oxide (MAG-OX)  torsemide (DEMADEX)  CHANGE how you take: potassium chloride SA (KLOR-CON M)  STOP taking: HYDROcodone-acetaminophen 5-325 MG tablet (Norco)   Medication changes verified by the patient? yes    Medication Accessibility:  Home Pharmacy:  Not discussed  Was the patient provided with refills on discharged medications? yes   Have all prescriptions been transferred from [facility] to home pharmacy?  PCP has already sent all meds to patient's [facility]  Is the patient able to afford medications? Has insurance    Medication Review:  APIXABAN (ELIQUIS)  Apixaban 5 mg BID initiated on 05/22/21.  - Discussed importance of taking medication around the same time everyday  - Advised patient of medications to avoid (NSAIDs, ASA)  - Educated that Tylenol (acetaminophen) will be the preferred analgesic to prevent risk of bleeding  - Emphasized importance of monitoring for signs and symptoms of bleeding (abnormal bruising, prolonged bleeding, nose bleeds, bleeding from gums, discolored urine, black tarry stools)  - Advised patient to alert all providers of anticoagulation therapy prior to starting a new medication or having a procedure    Follow-up Appointments:  PCP Hospital f/u appt confirmed?  PT has f/u on 07/03/21  Specialist Hospital f/u appt confirmed?   Pt has f/u with Dr. Lovena Le on 07/06/21  If their  condition worsens, is the pt aware to call PCP or go to the Emergency Dept.? yes  Final Patient Assessment: Patient has follow up scheduled and refills at home pharmacy

## 2021-06-20 NOTE — Telephone Encounter (Signed)
Debbie Barry    1:08 PM Note   *STAT* If patient is at the pharmacy, call can be transferred to refill team.     1. Which medications need to be refilled? (please list name of each medication and dose if known)  apixaban (ELIQUIS) 5 MG TABS tablet nadolol (CORGARD) 20 MG tablet   2. Which pharmacy/location (including street and city if local pharmacy) is medication to be sent to?   3. Do they need a 30 day or 90 day supply? 90 with refills   Patient said was apixaban (ELIQUIS) 5 MG TABS tablet sent to the wrong pharmacy. She does not use the [facility] said that the nadolol (CORGARD) 20 MG tablet was out of stock. She wanted to know if there was a replacement or alternative medication

## 2021-06-20 NOTE — Telephone Encounter (Signed)
Transitions of Care Pharmacy   Call attempted for a pharmacy transitions of care follow-up. HIPAA appropriate voicemail was left with call back information provided.   Call attempt #3. Will no longer attempt follow up for TOC pharmacy.   

## 2021-06-20 NOTE — Telephone Encounter (Signed)
Prescription refill request for Eliquis received. Indication: DVT  Last office visit: Pt has appt with Dr Lovena Le on 07/06/21 Scr: 0.95 (05/22/21)  Age: 70 Weight: 53.5kg  Appropriate dose and refill sent to requested pharmacy.

## 2021-06-20 NOTE — Telephone Encounter (Signed)
Per Dr. Jaclyn Prime substitute propranolol LA 60 mg PO daily for nadolol.  Medication sent as requested.  Left message advising of substitution.

## 2021-06-20 NOTE — Telephone Encounter (Signed)
°*  STAT* If patient is at the pharmacy, call can be transferred to refill team.   1. Which medications need to be refilled? (please list name of each medication and dose if known)  apixaban (ELIQUIS) 5 MG TABS tablet nadolol (CORGARD) 20 MG tablet  2. Which pharmacy/location (including street and city if local pharmacy) is medication to be sent to?  3. Do they need a 30 day or 90 day supply? 90 with refills  Patient said was apixaban (ELIQUIS) 5 MG TABS tablet sent to the wrong pharmacy. She does not use the [facility] pharmacy said that the nadolol (CORGARD) 20 MG tablet was out of stock. She wanted to know if there was a replacement or alternative medication

## 2021-06-21 DIAGNOSIS — I420 Dilated cardiomyopathy: Secondary | ICD-10-CM | POA: Diagnosis not present

## 2021-06-21 DIAGNOSIS — I469 Cardiac arrest, cause unspecified: Secondary | ICD-10-CM | POA: Diagnosis not present

## 2021-06-21 DIAGNOSIS — I4901 Ventricular fibrillation: Secondary | ICD-10-CM | POA: Diagnosis not present

## 2021-06-26 ENCOUNTER — Telehealth: Payer: Self-pay | Admitting: *Deleted

## 2021-06-26 NOTE — Chronic Care Management (AMB) (Signed)
°  Care Management   Note  06/26/2021 Name: Debbie Barry MRN: [id number] DOB: 14-May-1951  Debbie Barry is a 70 y.o. year old female who is a primary care patient of Glendale Chard, MD and is actively engaged with the care management team. I reached out to Kittie Plater by phone today to assist with re-scheduling an initial visit with the BSW  Follow up plan: Unsuccessful telephone outreach attempt made. A HIPAA compliant phone message was left for the patient providing contact information and requesting a return call.  The care management team will reach out to the patient again over the next 2 days.  If patient returns call to provider office, please advise to call Tat Momoli at [phone].  [place] [facility]  Direct Dial: [phone]

## 2021-06-28 NOTE — Chronic Care Management (AMB) (Signed)
°  Care Management   Note  06/28/2021 Name: Debbie Barry MRN: [id number] DOB: February 03, 1951  Debbie Barry is a 70 y.o. year old female who is a primary care patient of Glendale Chard, MD and is actively engaged with the care management team. I reached out to Kittie Plater by phone today to assist with re-scheduling an initial visit with the BSW  Follow up plan: Telephone appointment with care management team member scheduled for:06/29/21  [place] Management  Direct Dial: [phone]

## 2021-06-29 ENCOUNTER — Telehealth: Payer: Self-pay

## 2021-06-29 ENCOUNTER — Telehealth: Payer: Medicare HMO

## 2021-06-29 NOTE — Telephone Encounter (Signed)
°  Care Management   Follow Up Note   06/29/2021 Name: Debbie Barry MRN: [id number] DOB: Jun 10, 1951   Referred by: Glendale Chard, MD Reason for referral : Chronic Care Management (Unsuccessful call)   A second unsuccessful telephone outreach was attempted today. The patient was referred to the case management team for assistance with care management and care coordination. SW left a HIPAA compliant voice message requesting a return call.  Follow Up Plan: The care management team will reach out to the patient again over the next 30 days.   Daneen Schick, BSW, CDP Social Worker, Certified Dementia Practitioner [place] [facility] [place] [facility] [phone]

## 2021-07-03 ENCOUNTER — Telehealth: Payer: Medicare HMO

## 2021-07-05 ENCOUNTER — Ambulatory Visit (INDEPENDENT_AMBULATORY_CARE_PROVIDER_SITE_OTHER): Payer: Medicare HMO

## 2021-07-05 ENCOUNTER — Telehealth: Payer: Medicare HMO

## 2021-07-05 ENCOUNTER — Ambulatory Visit: Payer: Medicare HMO

## 2021-07-05 DIAGNOSIS — I82402 Acute embolism and thrombosis of unspecified deep veins of left lower extremity: Secondary | ICD-10-CM

## 2021-07-05 DIAGNOSIS — M48061 Spinal stenosis, lumbar region without neurogenic claudication: Secondary | ICD-10-CM

## 2021-07-05 DIAGNOSIS — I5043 Acute on chronic combined systolic (congestive) and diastolic (congestive) heart failure: Secondary | ICD-10-CM

## 2021-07-05 DIAGNOSIS — I469 Cardiac arrest, cause unspecified: Secondary | ICD-10-CM

## 2021-07-05 DIAGNOSIS — I429 Cardiomyopathy, unspecified: Secondary | ICD-10-CM

## 2021-07-05 NOTE — Chronic Care Management (AMB) (Signed)
Chronic Care Management   CCM RN Visit Note  07/05/2021 Name: Debbie Barry MRN: [id number] DOB: [birth year]  Subjective: Debbie Barry is a 71 y.o. year old female who is a primary care patient of Glendale Chard, MD. The care management team was consulted for assistance with disease management and care coordination needs.    Collaboration with Daneen Schick BSW  to establish a plan of care for management of chronic disease states in response to provider referral for case management and/or care coordination services.   Consent to Services:  The patient was given the following information about Chronic Care Management services today, agreed to services, and gave verbal consent: 1. CCM service includes personalized support from designated clinical staff supervised by the primary care provider, including individualized plan of care and coordination with other care providers 2. 24/7 contact phone numbers for assistance for urgent and routine care needs. 3. Service will only be billed when office clinical staff spend 20 minutes or more in a month to coordinate care. 4. Only one practitioner may furnish and bill the service in a calendar month. 5.The patient may stop CCM services at any time (effective at the end of the month) by phone call to the office staff. 6. The patient will be responsible for cost sharing (co-pay) of up to 20% of the service fee (after annual deductible is met). Patient agreed to services and consent obtained.  Patient agreed to services and verbal consent obtained.   Assessment: Review of patient past medical history, allergies, medications, health status, including review of consultants reports, laboratory and other test data, was performed as part of comprehensive evaluation and provision of chronic care management services.   SDOH (Social Determinants of Health) assessments and interventions performed:    CCM Care Plan  No Known Allergies  Outpatient Encounter  Medications as of 07/05/2021  Medication Sig   acetaminophen (TYLENOL) 325 MG tablet Take 650 mg by mouth every 6 (six) hours as needed for moderate pain.   apixaban (ELIQUIS) 5 MG TABS tablet Take 1 tablet (5 mg total) by mouth 2 (two) times daily.   bismuth subsalicylate (PEPTO BISMOL) 262 MG/15ML suspension Take 30 mLs by mouth every 6 (six) hours as needed for indigestion.   calcium carbonate (TUMS EX) 750 MG chewable tablet Chew 1 tablet by mouth daily as needed for heartburn.   Cholecalciferol (VITAMIN D3) 5000 units CAPS Take 5,000 Units by mouth daily.   feeding supplement (BOOST / RESOURCE BREEZE) LIQD Take 1 Container by mouth 3 (three) times daily between meals.   hydrOXYzine (ATARAX) 10 MG tablet Take 1 tablet (10 mg total) by mouth 3 (three) times daily as needed.   losartan (COZAAR) 25 MG tablet Take 1 tablet (25 mg total) by mouth daily.   magnesium oxide (MAG-OX) 400 MG tablet Take 1 tablet (400 mg total) by mouth daily.   Multiple Vitamin (MULTIVITAMIN WITH MINERALS) TABS tablet Take 1 tablet by mouth daily.   ondansetron (ZOFRAN ODT) 4 MG disintegrating tablet Take 1 tablet (4 mg total) by mouth every 8 (eight) hours as needed for nausea or vomiting.   pantoprazole (PROTONIX) 40 MG tablet Take 1 tablet (40 mg total) by mouth daily.   potassium chloride SA (KLOR-CON) 20 MEQ tablet Take 1 tablet (20 mEq total) by mouth 2 (two) times daily after a meal.   propranolol ER (INDERAL LA) 60 MG 24 hr capsule Take 1 capsule (60 mg total) by mouth daily.   sertraline (ZOLOFT) 25 MG  tablet Take 1 tablet (25 mg total) by mouth daily.   spironolactone (ALDACTONE) 25 MG tablet Take 1 tablet (25 mg total) by mouth daily.   sucralfate (CARAFATE) 1 g tablet Take 1 tablet (1 g total) by mouth 2 (two) times daily.   Tetrahydrozoline HCl (VISINE OP) Place 2 drops into both eyes daily as needed (for dry/irritated eyes).    torsemide (DEMADEX) 20 MG tablet Take 2 tablets (40 mg total) by mouth 2 (two)  times daily.   No facility-administered encounter medications on file as of 07/05/2021.    Patient Active Problem List   Diagnosis Date Noted   Cardiomyopathy [facility])    Nonischemic cardiomyopathy ([place])    Cardiac arrest ([place])    DVT (deep venous thrombosis) ([place]) 05/03/2021   Renal infarct ([place]) 05/03/2021   Hyponatremia 05/03/2021   Chronic retention of urine 05/03/2021   Lumbar spinal stenosis 05/03/2021   Anasarca 05/03/2021   Bilateral pleural effusion 05/03/2021   Weakness of left lower extremity 04/20/2021   Asymptomatic bacteriuria 04/20/2021   Prolonged QT interval 04/20/2021   Protein-calorie malnutrition, severe 04/03/2021   Gastritis 03/31/2021   Nausea and vomiting 03/31/2021   Iron overload 02/13/2021   Neuropathy 02/13/2021   Elevated LFTs 02/13/2021   Weight loss, non-intentional 02/13/2021   Falls frequently 02/13/2021   GI bleed 08/13/2020   UGIB (upper gastrointestinal bleed) 08/12/2020   Depression 08/12/2020   Hypokalemia 08/12/2020   Essential hypertension, benign 03/23/2019    Conditions to be addressed/monitored: Cardiac arrest, Deep vein thrombosis, spinal stenosis of lumbar spine,acute on chronic combined systolic and diastolic CHF, Cardiomyopathy  Care Plan : RN Care Manager Plan of Care  Updates made by Lynne Logan, RN since 07/05/2021 12:00 AM     Problem: No Plan of Care Established for management of chronic disease states (Cardiac arrest, Deep vein thrombosis, spinal stenosis of lumbar spine,acute on chronic combined systolic and diastolic CHF, Cardiomyopath)   Priority: High     Long-Range Goal: Establishment of Plan of Care for Management of Chronic disease states (Cardiac arrest, Deep vein thrombosis, spinal stenosis of lumbar spine,acute on chronic combined systolic and diastolic CHF, Cardiomyopath)   Start Date: 07/05/2021  Expected End Date: 07/05/2022  This Visit's Progress: On track  Priority: High  Note:   Current Barriers:   Knowledge Deficits related to plan of care for management of Cardiac arrest, Deep vein thrombosis, spinal stenosis of lumbar spine,acute on chronic combined systolic and diastolic CHF, Cardiomyopathy  Chronic Disease Management support and education needs related to Cardiac arrest, Deep vein thrombosis, spinal stenosis of lumbar spine,acute on chronic combined systolic and diastolic CHF, Cardiomyopathy    RNCM Clinical Goal(s):  Patient will continue to work with RN Care Manager to address care management and care coordination needs related to  Cardiac arrest, Deep vein thrombosis, spinal stenosis of lumbar spine,acute on chronic combined systolic and diastolic CHF, Cardiomyopathy as evidenced by adherence to CM Team Scheduled appointments through collaboration with RN Care manager, provider, and care team.   Interventions: 1:1 collaboration with primary care provider regarding development and update of comprehensive plan of care as evidenced by provider attestation and co-signature Inter-disciplinary care team collaboration (see longitudinal plan of care) Evaluation of current treatment plan related to  self management and patient's adherence to plan as established by provider   Interdisciplinary Collaboration Interventions:  (Status: New goal.) Long Term Goal   Collaborated with BSW to initiate plan of care to address needs related to Limited access  to food and Lack of essential utilities - heat* in patient with  Cardiac arrest, Deep vein thrombosis, spinal stenosis of lumbar spine,acute on chronic combined systolic and diastolic CHF, Cardiomyopathy Collaboration with Glendale Chard, MD regarding development and update of comprehensive plan of care as evidenced by provider attestation and co-signature Inter-disciplinary care team collaboration   Patient Goals/Self-Care Activities: Take all medications as prescribed Attend all scheduled provider appointments Call pharmacy for medication  refills 3-7 days in advance of running out of medications Perform all self care activities independently  Perform IADL's (shopping, preparing meals, housekeeping, managing finances) independently Call provider office for new concerns or questions  Work with the social worker to address care coordination needs and will continue to work with the clinical team to address health care and disease management related needs  Follow Up Plan:  Telephone follow up appointment with care management team member scheduled for:  07/26/21      Plan:Telephone follow up appointment with care management team member scheduled for:  07/26/21  Barb Merino, RN, BSN, CCM Care Management Coordinator [place] [facility]  Direct Phone: [phone]

## 2021-07-05 NOTE — Chronic Care Management (AMB) (Signed)
Chronic Care Management    Social Work Note  07/05/2021 Name: Debbie Barry MRN: [id number] DOB: 03-28-1951  Debbie Barry is a 71 y.o. year old female who is a primary care patient of Glendale Chard, MD. The CCM team was consulted to assist the patient with chronic disease management and/or care coordination needs related to: Deep Vein Thrombosis, Spinal Stenosis of Lumbar Spine, Cardiomyopathy, and Acute on Chronic Combined Systolic and Diastolic CHF.  Engaged with patient by telephone for initial visit in response to provider referral for social work chronic care management and care coordination services.   Consent to Services:  The patient was given the following information about Chronic Care Management services today, agreed to services, and gave verbal consent: 1. CCM service includes personalized support from designated clinical staff supervised by the primary care provider, including individualized plan of care and coordination with other care providers 2. 24/7 contact phone numbers for assistance for urgent and routine care needs. 3. Service will only be billed when office clinical staff spend 20 minutes or more in a month to coordinate care. 4. Only one practitioner may furnish and bill the service in a calendar month. 5.The patient may stop CCM services at any time (effective at the end of the month) by phone call to the office staff. 6. The patient will be responsible for cost sharing (co-pay) of up to 20% of the service fee (after annual deductible is met). Patient agreed to services and consent obtained.  Patient agreed to services and consent obtained.   Assessment: Review of patient past medical history, allergies, medications, and health status, including review of relevant consultants reports was performed today as part of a comprehensive evaluation and provision of chronic care management and care coordination services.     SDOH (Social Determinants of Health)  assessments and interventions performed:  SDOH Interventions    Flowsheet Row Most Recent Value  SDOH Interventions   Food Insecurity Interventions Intervention Not Indicated  Housing Interventions Other (Comment)  [provided information on LIEAP - patient reports getting behind due to inability to work]  Transportation Interventions Intervention Not Indicated        Advanced Directives Status: Not addressed in this encounter.  CCM Care Plan  No Known Allergies  Outpatient Encounter Medications as of 07/05/2021  Medication Sig   acetaminophen (TYLENOL) 325 MG tablet Take 650 mg by mouth every 6 (six) hours as needed for moderate pain.   apixaban (ELIQUIS) 5 MG TABS tablet Take 1 tablet (5 mg total) by mouth 2 (two) times daily.   bismuth subsalicylate (PEPTO BISMOL) 262 MG/15ML suspension Take 30 mLs by mouth every 6 (six) hours as needed for indigestion.   calcium carbonate (TUMS EX) 750 MG chewable tablet Chew 1 tablet by mouth daily as needed for heartburn.   Cholecalciferol (VITAMIN D3) 5000 units CAPS Take 5,000 Units by mouth daily.   feeding supplement (BOOST / RESOURCE BREEZE) LIQD Take 1 Container by mouth 3 (three) times daily between meals.   hydrOXYzine (ATARAX) 10 MG tablet Take 1 tablet (10 mg total) by mouth 3 (three) times daily as needed.   losartan (COZAAR) 25 MG tablet Take 1 tablet (25 mg total) by mouth daily.   magnesium oxide (MAG-OX) 400 MG tablet Take 1 tablet (400 mg total) by mouth daily.   Multiple Vitamin (MULTIVITAMIN WITH MINERALS) TABS tablet Take 1 tablet by mouth daily.   ondansetron (ZOFRAN ODT) 4 MG disintegrating tablet Take 1 tablet (4 mg total) by mouth  every 8 (eight) hours as needed for nausea or vomiting.   pantoprazole (PROTONIX) 40 MG tablet Take 1 tablet (40 mg total) by mouth daily.   potassium chloride SA (KLOR-CON) 20 MEQ tablet Take 1 tablet (20 mEq total) by mouth 2 (two) times daily after a meal.   propranolol ER (INDERAL LA) 60 MG 24  hr capsule Take 1 capsule (60 mg total) by mouth daily.   sertraline (ZOLOFT) 25 MG tablet Take 1 tablet (25 mg total) by mouth daily.   spironolactone (ALDACTONE) 25 MG tablet Take 1 tablet (25 mg total) by mouth daily.   sucralfate (CARAFATE) 1 g tablet Take 1 tablet (1 g total) by mouth 2 (two) times daily.   Tetrahydrozoline HCl (VISINE OP) Place 2 drops into both eyes daily as needed (for dry/irritated eyes).    torsemide (DEMADEX) 20 MG tablet Take 2 tablets (40 mg total) by mouth 2 (two) times daily.   No facility-administered encounter medications on file as of 07/05/2021.    Patient Active Problem List   Diagnosis Date Noted   Cardiomyopathy [facility])    Nonischemic cardiomyopathy ([place])    Cardiac arrest ([place])    DVT (deep venous thrombosis) ([place]) 05/03/2021   Renal infarct ([place]) 05/03/2021   Hyponatremia 05/03/2021   Chronic retention of urine 05/03/2021   Lumbar spinal stenosis 05/03/2021   Anasarca 05/03/2021   Bilateral pleural effusion 05/03/2021   Weakness of left lower extremity 04/20/2021   Asymptomatic bacteriuria 04/20/2021   Prolonged QT interval 04/20/2021   Protein-calorie malnutrition, severe 04/03/2021   Gastritis 03/31/2021   Nausea and vomiting 03/31/2021   Iron overload 02/13/2021   Neuropathy 02/13/2021   Elevated LFTs 02/13/2021   Weight loss, non-intentional 02/13/2021   Falls frequently 02/13/2021   GI bleed 08/13/2020   UGIB (upper gastrointestinal bleed) 08/12/2020   Depression 08/12/2020   Hypokalemia 08/12/2020   Essential hypertension, benign 03/23/2019    Conditions to be addressed/monitored: Deep Vein Thrombosis, Spinal Stenosis of Lumbar Spine, Cardiomyopathy, and Acute on Chronic Combined Systolic and Diastolic CHF; Financial constraints related to costs of living  Care Plan : Social Work plan of care  Updates made by Daneen Schick since 07/05/2021 12:00 AM     Problem: Quality of Life (General Plan of Care)      Goal: Identify  Financial Resources to Assist with Costs of Daily Living   Start Date: 07/05/2021  Priority: High  Note:   Current Barriers:  Chronic disease management support and education needs related to  Deep Vein Thrombosis, Spinal Stenosis of Lumbar Spine, Cardiomyopathy, and Acute on Chronic Combined Systolic and Diastolic CHF   Recent inpatient stay from 05/02/21 - 05/22/21 due to cardiac arrest Financial constraints due to patients inability to return to her part-time job and spouse staying home on FMLA to care for the patient Social Worker Clinical Goal(s):  patient will work with SW to identify and address any acute and/or chronic care coordination needs related to the self health management of Deep Vein Thrombosis, Spinal Stenosis of Lumbar Spine, Cardiomyopathy, and Acute on Chronic Combined Systolic and Diastolic CHF  Patient will work with SW to gain a better understanding of financial resources to assist with utilities and food costs SW Interventions:  Inter-disciplinary care team collaboration (see longitudinal plan of care) Collaboration with Glendale Chard, MD regarding development and update of comprehensive plan of care as evidenced by provider attestation and co-signature Telephonic visit completed with the patient to conduct a SW screen Discussed 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 Since patients cardiac arrest she has been unable to return to work and did not qualify for leave benefits Patient reports her husband also worked part-time but took Event organiser to care for her in the home so their income has dropped substantially. Patients husband plans to return to work next week Determined that due to drop in income the patient has gotten behind on several credit card bills and Printmaker. Patient is seeking financial resources Educated the patient on [place] (LIEAP) - patient is interested in applying  for this program as well as food stamps Discussed opportunity to apply for these programs in person at [place], online, or by mail. Patient has selected to apply online Provided the patient with the website to access ePass to apply for desired programs Encouraged the patient to also apply for Medicaid to determine if she is eligible to have her Medicare premium covered - patient stated understanding  Assessed for barriers for the patient in the home - patient denies barriers and indicates she has a walker, wheelchair, raised toilet seat, and shower chair Discussed plan for patient to work with SW to address resource needs while patient engages with  RN Case Manager  to address care management needs Scheduled follow up call over the next 21 days Collaboration with [facility] to discuss patient enrollment status, interventions, and plan Patient Goals/Self-Care Activities patient will:   -  Access [place] DHHS website to apply for assistance via [place] with [place] to develop an individualized plan of care -Contact SW as needed prior to next scheduled call Follow Up Plan: The care management team will reach out to the patient again over the next 21 days.        Follow Up Plan: SW will follow up with patient by phone over the next 21 days.      Daneen Schick, BSW, CDP Social Worker, Certified Dementia Practitioner [place] [facility] [phone]

## 2021-07-05 NOTE — Patient Instructions (Addendum)
Social Worker Visit Information  Goals we discussed today:  Patient Goals/Self-Care Activities patient will:   -  Access [place] DHHS website to apply for assistance via [place] with [place] to develop an individualized plan of care -Contact SW as needed prior to next scheduled call  Materials provided: Verbal education about community resources provided by phone  Ms. Mchale was given information about Chronic Care Management services today including:  CCM service includes personalized support from designated clinical staff supervised by her physician, including individualized plan of care and coordination with other care providers 24/7 contact phone numbers for assistance for urgent and routine care needs. Service will only be billed when office clinical staff spend 20 minutes or more in a month to coordinate care. Only one practitioner may furnish and bill the service in a calendar month. The patient may stop CCM services at any time (effective at the end of the month) by phone call to the office staff. The patient will be responsible for cost sharing (co-pay) of up to 20% of the service fee (after annual deductible is met).  Patient agreed to services and verbal consent obtained.   Patient verbalizes understanding of instructions provided today and agrees to view in [place].   Follow up plan: SW will follow up with patient by phone over the next 21 days   Daneen Schick, BSW, CDP Social Worker, Certified Dementia Practitioner [place] [facility] [place] [facility] [phone]

## 2021-07-06 ENCOUNTER — Other Ambulatory Visit: Payer: Self-pay

## 2021-07-06 ENCOUNTER — Ambulatory Visit (INDEPENDENT_AMBULATORY_CARE_PROVIDER_SITE_OTHER): Payer: Medicare HMO | Admitting: Internal Medicine

## 2021-07-06 ENCOUNTER — Ambulatory Visit (INDEPENDENT_AMBULATORY_CARE_PROVIDER_SITE_OTHER): Payer: Medicare HMO

## 2021-07-06 ENCOUNTER — Encounter: Payer: Self-pay | Admitting: Internal Medicine

## 2021-07-06 VITALS — BP 144/82 | HR 98 | Ht 61.0 in | Wt 137.0 lb

## 2021-07-06 DIAGNOSIS — I428 Other cardiomyopathies: Secondary | ICD-10-CM

## 2021-07-06 DIAGNOSIS — I469 Cardiac arrest, cause unspecified: Secondary | ICD-10-CM

## 2021-07-06 DIAGNOSIS — Z89421 Acquired absence of other right toe(s): Secondary | ICD-10-CM | POA: Diagnosis not present

## 2021-07-06 NOTE — Patient Instructions (Addendum)
Medication Instructions:  Your physician recommends that you continue on your current medications as directed. Please refer to the Current Medication list given to you today.  Labwork: You will get lab work today:  BMP and magnesium  Testing/Procedures: Your physician has recommended that you wear a holter monitor. Holter monitors are medical devices that record the hearts electrical activity. Doctors most often use these monitors to diagnose arrhythmias. Arrhythmias are problems with the speed or rhythm of the heartbeat. The monitor is a small, portable device. You can wear one while you do your normal daily activities. This is usually used to diagnose what is causing palpitations/syncope (passing out).  You will wear a 14 day ZIO monitor.  Follow-Up: Your physician wants you to follow-up based on results of heart monitor.   Any Other Special Instructions Will Be Listed Below (If Applicable).  If you need a refill on your cardiac medications before your next appointment, please call your pharmacy.   Your physician has recommended that you wear a Zio monitor.   This monitor is a medical device that records the hearts electrical activity. Doctors most often use these monitors to diagnose arrhythmias. Arrhythmias are problems with the speed or rhythm of the heartbeat. The monitor is a small device applied to your chest. You can wear one while you do your normal daily activities. While wearing this monitor if you have any symptoms to push the button and record what you felt. Once you have worn this monitor for the period of time provider prescribed (Usually 14 days), you will return the monitor device in the postage paid box. Once it is returned they will download the data collected and provide [place] with a report which the provider will then review and we will call you with those results. Important tips:  Avoid showering during the first 24 hours of wearing the monitor. Avoid excessive sweating  to help maximize wear time. Do not submerge the device, no hot tubs, and no swimming pools. Keep any lotions or oils away from the patch. After 24 hours you may shower with the patch on. Take brief showers with your back facing the shower head.  Do not remove patch once it has been placed because that will interrupt data and decrease adhesive wear time. Push the button when you have any symptoms and write down what you were feeling. Once you have completed wearing your monitor, remove and place into box which has postage paid and place in your outgoing mailbox.  If for some reason you have misplaced your box then call our office and we can provide another box and/or mail it off for you.

## 2021-07-06 NOTE — Progress Notes (Signed)
HPI [name] returns today for followup. She is a pleasant 71 yo woman with a h/o  HTN and chronic protein malnutrition who was admitted to the hospital almost 2 months ago with a multitude of problems including pronounced metabolic abnormalities who experienced a PMVT arrest in the setting of marked QT prolongation due to a magnesium level of 0.8 and a potassium level of 2.6. The patient has improved. Her diarrhea is still present but better. She has not had any more vomiting. She denies chest pain, sob, or syncope and her Life Vest has not gone off.   No Known Allergies   Current Outpatient Medications  Medication Sig Dispense Refill   acetaminophen (TYLENOL) 325 MG tablet Take 650 mg by mouth every 6 (six) hours as needed for moderate pain.     apixaban (ELIQUIS) 5 MG TABS tablet Take 1 tablet (5 mg total) by mouth 2 (two) times daily. 60 tablet 2   bismuth subsalicylate (PEPTO BISMOL) 262 MG/15ML suspension Take 30 mLs by mouth every 6 (six) hours as needed for indigestion.     calcium carbonate (TUMS EX) 750 MG chewable tablet Chew 1 tablet by mouth daily as needed for heartburn.     Cholecalciferol (VITAMIN D3) 5000 units CAPS Take 5,000 Units by mouth daily.     feeding supplement (BOOST / RESOURCE BREEZE) LIQD Take 1 Container by mouth 3 (three) times daily between meals.     hydrOXYzine (ATARAX) 10 MG tablet Take 1 tablet (10 mg total) by mouth 3 (three) times daily as needed. 30 tablet 0   losartan (COZAAR) 25 MG tablet Take 1 tablet (25 mg total) by mouth daily. 30 tablet 2   magnesium oxide (MAG-OX) 400 MG tablet Take 1 tablet (400 mg total) by mouth daily. 30 tablet 2   Multiple Vitamin (MULTIVITAMIN WITH MINERALS) TABS tablet Take 1 tablet by mouth daily.     ondansetron (ZOFRAN ODT) 4 MG disintegrating tablet Take 1 tablet (4 mg total) by mouth every 8 (eight) hours as needed for nausea or vomiting. 20 tablet 0   pantoprazole (PROTONIX) 40 MG tablet Take 1 tablet (40 mg  total) by mouth daily. 30 tablet 0   potassium chloride SA (KLOR-CON) 20 MEQ tablet Take 1 tablet (20 mEq total) by mouth 2 (two) times daily after a meal. 60 tablet 2   propranolol ER (INDERAL LA) 60 MG 24 hr capsule Take 1 capsule (60 mg total) by mouth daily. 30 capsule 11   sertraline (ZOLOFT) 25 MG tablet Take 1 tablet (25 mg total) by mouth daily. 90 tablet 1   spironolactone (ALDACTONE) 25 MG tablet Take 1 tablet (25 mg total) by mouth daily. 30 tablet 2   sucralfate (CARAFATE) 1 g tablet Take 1 tablet (1 g total) by mouth 2 (two) times daily. 60 tablet 2   Tetrahydrozoline HCl (VISINE OP) Place 2 drops into both eyes daily as needed (for dry/irritated eyes).      torsemide (DEMADEX) 20 MG tablet Take 2 tablets (40 mg total) by mouth 2 (two) times daily. 120 tablet 2   No current facility-administered medications for this visit.     Past Medical History:  Diagnosis Date   Fatty liver    GERD (gastroesophageal reflux disease)    Hypertension    Osteomyelitis ([place])    right third toe   Peripheral vascular disease ([place])    Ulcer 08/14/20    ROS:   All systems reviewed and negative except  as noted in the HPI.   Past Surgical History:  Procedure Laterality Date   AMPUTATION Right 08/08/2017   Procedure: AMPUTATION RIGHT 3RD TOE;  Surgeon: Newt Minion, MD;  Location: [facility];  Service: Orthopedics;  Laterality: Right;   BIOPSY  08/14/2020   Procedure: BIOPSY;  Surgeon: Clarene Essex, MD;  Location: [facility];  Service: Endoscopy;;   Bunionectomy Right 2006     Right   Bunionectony Left 2006      August   COLONOSCOPY N/A 01/21/2014   Procedure: COLONOSCOPY;  Surgeon: Beryle Beams, MD;  Location: [facility];  Service: Endoscopy;  Laterality: N/A;   ESOPHAGOGASTRODUODENOSCOPY (EGD) WITH PROPOFOL N/A 08/14/2020   Procedure: ESOPHAGOGASTRODUODENOSCOPY (EGD) WITH PROPOFOL;  Surgeon: Clarene Essex, MD;  Location: [facility];  Service: Endoscopy;  Laterality: N/A;   IR  THORACENTESIS ASP PLEURAL SPACE W/IMG GUIDE  05/03/2021   RIGHT/LEFT HEART CATH AND CORONARY ANGIOGRAPHY N/A 05/11/2021   Procedure: RIGHT/LEFT HEART CATH AND CORONARY ANGIOGRAPHY;  Surgeon: Troy Sine, MD;  Location: [facility];  Service: Cardiovascular;  Laterality: N/A;   TUBAL LIGATION     WISDOM TOOTH EXTRACTION       Family History  Problem Relation Age of Onset   Breast cancer Mother    Colon cancer Mother    Heart Problems Mother    Hypertension Mother    Multiple myeloma Mother    Arthritis Mother    Cancer Mother    Heart disease Mother    Hypertension Father    Heart disease Father    Multiple myeloma Father    Arthritis Father    Cancer Father      Social History   Socioeconomic History   Marital status: Married    Spouse name: Not on file   Number of children: Not on file   Years of education: Not on file   Highest education level: Not on file  Occupational History   Not on file  Tobacco Use   Smoking status: Never   Smokeless tobacco: Never  Vaping Use   Vaping Use: Never used  Substance and Sexual Activity   Alcohol use: Not Currently    Comment: social glass of wine   Drug use: No   Sexual activity: Yes    Birth control/protection: Post-menopausal  Other Topics Concern   Not on file  Social History Narrative   Not on file   Social Determinants of Health   Financial Resource Strain: Low Risk    Difficulty of Paying Living Expenses: Not hard at all  Food Insecurity: No Food Insecurity   Worried About Charity fundraiser in the Last Year: Never true   [place] in the Last Year: Never true  Transportation Needs: No Transportation Needs   Lack of Transportation (Medical): No   Lack of Transportation (Non-Medical): No  Physical Activity: Inactive   Days of Exercise per Week: 0 days   Minutes of Exercise per Session: 0 min  Stress: Stress Concern Present   Feeling of Stress : To some extent  Social Connections: Not on file   Intimate Partner Violence: Not on file     BP (!) 144/82    Pulse 98    Ht $R'5\' 1"'XU$  (1.549 m)    Wt 137 lb (62.1 kg)    SpO2 98%    BMI 25.89 kg/m   Physical Exam:  Well appearing NAD HEENT: Unremarkable Neck:  No JVD, no thyromegally Lymphatics:  No  adenopathy Back:  No CVA tenderness Lungs:  Clear with no wheezes; life vest is in place HEART:  Regular rate rhythm, no murmurs, no rubs, no clicks Abd:  soft, positive bowel sounds, no organomegally, no rebound, no guarding Ext:  2 plus pulses, no edema, no cyanosis, no clubbing Skin:  No rashes no nodules Neuro:  CN II through XII intact, motor grossly intact  EKG - nsr with borderline prolonged QT   Assess/Plan: Prolonged QT in the setting of metabolic derangements - she will continue her beta blocker, avoid QT prolonging meds and maintain adequate electrolytes. We will check her labs today.  VF arrest - secondary to above. We will check her electrolytes and I will ask her to wear a Zio monitor.  Diarrhea/Vomiting - this is much improved.  Chronic systolic heart failure - her EF was initially low but improved. She will continue her current meds. Could consider switching losartan to entresto in the future.  Carleene Overlie Taiden Raybourn,MD

## 2021-07-06 NOTE — Progress Notes (Unsigned)
Enrolled patient for a 14 day Zio XT  monitor to be mailed to patients home  °

## 2021-07-07 LAB — BASIC METABOLIC PANEL
BUN/Creatinine Ratio: 8 — ABNORMAL LOW (ref 12–28)
BUN: 5 mg/dL — ABNORMAL LOW (ref 8–27)
CO2: 32 mmol/L — ABNORMAL HIGH (ref 20–29)
Calcium: 9.1 mg/dL (ref 8.7–10.3)
Chloride: 101 mmol/L (ref 96–106)
Creatinine, Ser: 0.62 mg/dL (ref 0.57–1.00)
Glucose: 83 mg/dL (ref 70–99)
Potassium: 3.4 mmol/L — ABNORMAL LOW (ref 3.5–5.2)
Sodium: 144 mmol/L (ref 134–144)
eGFR: 97 mL/min/{1.73_m2} (ref >59)

## 2021-07-07 LAB — MAGNESIUM: Magnesium: 1.4 mg/dL — ABNORMAL LOW (ref 1.6–2.3)

## 2021-07-09 ENCOUNTER — Telehealth: Payer: Self-pay | Admitting: Internal Medicine

## 2021-07-09 DIAGNOSIS — I469 Cardiac arrest, cause unspecified: Secondary | ICD-10-CM

## 2021-07-09 DIAGNOSIS — E876 Hypokalemia: Secondary | ICD-10-CM

## 2021-07-09 DIAGNOSIS — I428 Other cardiomyopathies: Secondary | ICD-10-CM

## 2021-07-09 NOTE — Telephone Encounter (Signed)
Patient is calling for her lab results.  She would also like to know if she can take off the life vest.

## 2021-07-09 NOTE — Telephone Encounter (Signed)
Advised ok to take off lifevest.  Will clarify instructions

## 2021-07-10 ENCOUNTER — Telehealth (HOSPITAL_BASED_OUTPATIENT_CLINIC_OR_DEPARTMENT_OTHER): Payer: Self-pay | Admitting: Family

## 2021-07-10 MED ORDER — TORSEMIDE 20 MG PO TABS: 40.0000 mg | ORAL_TABLET | Freq: Every day | ORAL | 3 refills | Status: DC

## 2021-07-10 NOTE — Telephone Encounter (Signed)
Outreach made to Pt.  Advised to decrease torsemide to 40 mg PO daily  Will come back in 2 weeks for nurse visit EKG and lab work.  Pt indicates understanding.

## 2021-07-10 NOTE — Telephone Encounter (Signed)
Left message for patient to call and reschedule 07/19/21 visit with Laurann Montana, NP----appt needs to be pushed out 2-3 months

## 2021-07-12 ENCOUNTER — Ambulatory Visit: Payer: Medicare HMO

## 2021-07-12 DIAGNOSIS — I82402 Acute embolism and thrombosis of unspecified deep veins of left lower extremity: Secondary | ICD-10-CM

## 2021-07-12 DIAGNOSIS — I5043 Acute on chronic combined systolic (congestive) and diastolic (congestive) heart failure: Secondary | ICD-10-CM

## 2021-07-12 DIAGNOSIS — M48061 Spinal stenosis, lumbar region without neurogenic claudication: Secondary | ICD-10-CM

## 2021-07-12 DIAGNOSIS — I429 Cardiomyopathy, unspecified: Secondary | ICD-10-CM

## 2021-07-12 NOTE — Chronic Care Management (AMB) (Signed)
Chronic Care Management    Social Work Note  07/12/2021 Name: Debbie Barry MRN: [id number] DOB: 07-28-1950  Debbie Barry is a 71 y.o. year old female who is a primary care patient of Glendale Chard, MD. The CCM team was consulted to assist the patient with chronic disease management and/or care coordination needs related to:   Deep Vein Thrombosis, Spinal Stenosis of Lumbar Spine, Cardiomyopathy, Acute on Chronic Combined Systolic and Diastolic CHF. .   Mailed resources to the patient  for follow up visit in response to provider referral for social work chronic care management and care coordination services.   Consent to Services:  The patient was given information about Chronic Care Management services, agreed to services, and gave verbal consent prior to initiation of services.  Please see initial visit note for detailed documentation.   Patient agreed to services and consent obtained.   Assessment: Review of patient past medical history, allergies, medications, and health status, including review of relevant consultants reports was performed today as part of a comprehensive evaluation and provision of chronic care management and care coordination services.     SDOH (Social Determinants of Health) assessments and interventions performed:    Advanced Directives Status: Not addressed in this encounter.  CCM Care Plan  No Known Allergies  Outpatient Encounter Medications as of 07/12/2021  Medication Sig   acetaminophen (TYLENOL) 325 MG tablet Take 650 mg by mouth every 6 (six) hours as needed for moderate pain.   apixaban (ELIQUIS) 5 MG TABS tablet Take 1 tablet (5 mg total) by mouth 2 (two) times daily.   bismuth subsalicylate (PEPTO BISMOL) 262 MG/15ML suspension Take 30 mLs by mouth every 6 (six) hours as needed for indigestion.   calcium carbonate (TUMS EX) 750 MG chewable tablet Chew 1 tablet by mouth daily as needed for heartburn.   Cholecalciferol (VITAMIN D3) 5000  units CAPS Take 5,000 Units by mouth daily.   feeding supplement (BOOST / RESOURCE BREEZE) LIQD Take 1 Container by mouth 3 (three) times daily between meals.   hydrOXYzine (ATARAX) 10 MG tablet Take 1 tablet (10 mg total) by mouth 3 (three) times daily as needed.   losartan (COZAAR) 25 MG tablet Take 1 tablet (25 mg total) by mouth daily.   magnesium oxide (MAG-OX) 400 MG tablet Take 1 tablet (400 mg total) by mouth daily.   Multiple Vitamin (MULTIVITAMIN WITH MINERALS) TABS tablet Take 1 tablet by mouth daily.   ondansetron (ZOFRAN ODT) 4 MG disintegrating tablet Take 1 tablet (4 mg total) by mouth every 8 (eight) hours as needed for nausea or vomiting.   pantoprazole (PROTONIX) 40 MG tablet Take 1 tablet (40 mg total) by mouth daily.   potassium chloride SA (KLOR-CON) 20 MEQ tablet Take 1 tablet (20 mEq total) by mouth 2 (two) times daily after a meal.   propranolol ER (INDERAL LA) 60 MG 24 hr capsule Take 1 capsule (60 mg total) by mouth daily.   sertraline (ZOLOFT) 25 MG tablet Take 1 tablet (25 mg total) by mouth daily.   spironolactone (ALDACTONE) 25 MG tablet Take 1 tablet (25 mg total) by mouth daily.   sucralfate (CARAFATE) 1 g tablet Take 1 tablet (1 g total) by mouth 2 (two) times daily.   Tetrahydrozoline HCl (VISINE OP) Place 2 drops into both eyes daily as needed (for dry/irritated eyes).    torsemide (DEMADEX) 20 MG tablet Take 2 tablets (40 mg total) by mouth daily.   No facility-administered encounter medications on  file as of 07/12/2021.    Patient Active Problem List   Diagnosis Date Noted   Cardiomyopathy [facility])    Nonischemic cardiomyopathy ([place])    Cardiac arrest ([place])    DVT (deep venous thrombosis) ([place]) 05/03/2021   Renal infarct ([place]) 05/03/2021   Hyponatremia 05/03/2021   Chronic retention of urine 05/03/2021   Lumbar spinal stenosis 05/03/2021   Anasarca 05/03/2021   Bilateral pleural effusion 05/03/2021   Weakness of left lower extremity 04/20/2021    Asymptomatic bacteriuria 04/20/2021   Prolonged QT interval 04/20/2021   Protein-calorie malnutrition, severe 04/03/2021   Gastritis 03/31/2021   Nausea and vomiting 03/31/2021   Iron overload 02/13/2021   Neuropathy 02/13/2021   Elevated LFTs 02/13/2021   Weight loss, non-intentional 02/13/2021   Falls frequently 02/13/2021   GI bleed 08/13/2020   UGIB (upper gastrointestinal bleed) 08/12/2020   Depression 08/12/2020   Hypokalemia 08/12/2020   Essential hypertension, benign 03/23/2019    Conditions to be addressed/monitored:  Deep Vein Thrombosis, Spinal Stenosis of Lumbar Spine, Cardiomyopathy, Acute on Chronic Combined Systolic and Diastolic CHF.; Financial constraints related to costs of living  Care Plan : Social Work plan of care  Updates made by Daneen Schick since 07/12/2021 12:00 AM     Problem: Quality of Life (General Plan of Care)      Goal: Identify Financial Resources to Assist with Costs of Daily Living   Start Date: 07/05/2021  Priority: High  Note:   Current Barriers:  Chronic disease management support and education needs related to  Deep Vein Thrombosis, Spinal Stenosis of Lumbar Spine, Cardiomyopathy, and Acute on Chronic Combined Systolic and Diastolic CHF   Recent inpatient stay from 05/02/21 - 05/22/21 due to cardiac arrest Financial constraints due to patients inability to return to her part-time job and spouse staying home on FMLA to care for the patient Social Worker Clinical Goal(s):  patient will work with SW to identify and address any acute and/or chronic care coordination needs related to the self health management of Deep Vein Thrombosis, Spinal Stenosis of Lumbar Spine, Cardiomyopathy, and Acute on Chronic Combined Systolic and Diastolic CHF  Patient will work with SW to gain a better understanding of financial resources to assist with utilities and food costs SW Interventions:  Inter-disciplinary care team collaboration (see longitudinal plan of  care) Collaboration with Glendale Chard, MD regarding development and update of comprehensive plan of care as evidenced by provider attestation and co-signature Voice message received from the patient advising she is having difficulty applying for assistance via ePass. Patient requests SW mail hard copies to her home Mailed applications for [employer] and [place] [employer] to the patients home  Patient Goals/Self-Care Activities patient will:   -  Apply for financial assistance via paper applications -Engage with RN Care Manager to develop an individualized plan of care -Contact SW as needed prior to next scheduled call Follow Up Plan: The care management team will reach out to the patient again over the next 21 days.        Follow Up Plan: SW will follow up with patient by phone over the next 14 days.      Daneen Schick, BSW, CDP Social Worker, Certified Dementia Practitioner [place] [facility] [place] [facility] [phone]

## 2021-07-12 NOTE — Patient Instructions (Signed)
Social Worker Visit Information  Goals we discussed today:  Patient Goals/Self-Care Activities patient will:   -  Apply for financial assistance via paper applications -Engage with RN Care Manager to develop an individualized plan of care -Contact SW as needed prior to next scheduled call  Materials Provided: Yes: mailed applications for community resources  Follow Up Plan: SW will follow up with patient by phone over the next 14 days   Daneen Schick, Arita Miss, CDP Social Worker, Certified Dementia Practitioner [place] [facility] [place] [facility] [phone]

## 2021-07-18 ENCOUNTER — Ambulatory Visit: Payer: Medicare HMO

## 2021-07-18 DIAGNOSIS — I5043 Acute on chronic combined systolic (congestive) and diastolic (congestive) heart failure: Secondary | ICD-10-CM

## 2021-07-18 DIAGNOSIS — I82402 Acute embolism and thrombosis of unspecified deep veins of left lower extremity: Secondary | ICD-10-CM

## 2021-07-18 DIAGNOSIS — M48061 Spinal stenosis, lumbar region without neurogenic claudication: Secondary | ICD-10-CM

## 2021-07-18 NOTE — Patient Instructions (Addendum)
Social Worker Visit Information  Goals we discussed today:  Patient Goals/Self-Care Activities patient will:   -  Apply for financial assistance via paper applications -Engage with RN Care Manager to develop an individualized plan of care -Contact SW as needed prior to next scheduled call  Patient verbalizes understanding of instructions and care plan provided today and agrees to view in [place]. Active MyChart status confirmed with patient.    Follow Up Plan: SW will follow up with patient by phone over the next week   Daneen Schick, BSW, CDP Social Worker, Certified Dementia Practitioner [place] [facility] [place] [facility] [phone]

## 2021-07-18 NOTE — Chronic Care Management (AMB) (Signed)
Chronic Care Management    Social Work Note  07/18/2021 Name: Debbie Barry MRN: [id number] DOB: 1950-09-24  Debbie Barry is a 71 y.o. year old female who is a primary care patient of Glendale Chard, MD. The CCM team was consulted to assist the patient with chronic disease management and/or care coordination needs related to:  Deep Vein Thrombosis, Spinal Stenosis of Lumbar Spine, Acute on Chronic Combined Systolic and Diastolic CHF .   Engaged with patient by telephone for follow up visit in response to provider referral for social work chronic care management and care coordination services.   Consent to Services:  The patient was given information about Chronic Care Management services, agreed to services, and gave verbal consent prior to initiation of services.  Please see initial visit note for detailed documentation.   Patient agreed to services and consent obtained.   Assessment: Review of patient past medical history, allergies, medications, and health status, including review of relevant consultants reports was performed today as part of a comprehensive evaluation and provision of chronic care management and care coordination services.     SDOH (Social Determinants of Health) assessments and interventions performed:    Advanced Directives Status: Not addressed in this encounter.  CCM Care Plan  No Known Allergies  Outpatient Encounter Medications as of 07/18/2021  Medication Sig   acetaminophen (TYLENOL) 325 MG tablet Take 650 mg by mouth every 6 (six) hours as needed for moderate pain.   apixaban (ELIQUIS) 5 MG TABS tablet Take 1 tablet (5 mg total) by mouth 2 (two) times daily.   bismuth subsalicylate (PEPTO BISMOL) 262 MG/15ML suspension Take 30 mLs by mouth every 6 (six) hours as needed for indigestion.   calcium carbonate (TUMS EX) 750 MG chewable tablet Chew 1 tablet by mouth daily as needed for heartburn.   Cholecalciferol (VITAMIN D3) 5000 units CAPS Take  5,000 Units by mouth daily.   feeding supplement (BOOST / RESOURCE BREEZE) LIQD Take 1 Container by mouth 3 (three) times daily between meals.   hydrOXYzine (ATARAX) 10 MG tablet Take 1 tablet (10 mg total) by mouth 3 (three) times daily as needed.   losartan (COZAAR) 25 MG tablet Take 1 tablet (25 mg total) by mouth daily.   magnesium oxide (MAG-OX) 400 MG tablet Take 1 tablet (400 mg total) by mouth daily.   Multiple Vitamin (MULTIVITAMIN WITH MINERALS) TABS tablet Take 1 tablet by mouth daily.   ondansetron (ZOFRAN ODT) 4 MG disintegrating tablet Take 1 tablet (4 mg total) by mouth every 8 (eight) hours as needed for nausea or vomiting.   pantoprazole (PROTONIX) 40 MG tablet Take 1 tablet (40 mg total) by mouth daily.   potassium chloride SA (KLOR-CON) 20 MEQ tablet Take 1 tablet (20 mEq total) by mouth 2 (two) times daily after a meal.   propranolol ER (INDERAL LA) 60 MG 24 hr capsule Take 1 capsule (60 mg total) by mouth daily.   sertraline (ZOLOFT) 25 MG tablet Take 1 tablet (25 mg total) by mouth daily.   spironolactone (ALDACTONE) 25 MG tablet Take 1 tablet (25 mg total) by mouth daily.   sucralfate (CARAFATE) 1 g tablet Take 1 tablet (1 g total) by mouth 2 (two) times daily.   Tetrahydrozoline HCl (VISINE OP) Place 2 drops into both eyes daily as needed (for dry/irritated eyes).    torsemide (DEMADEX) 20 MG tablet Take 2 tablets (40 mg total) by mouth daily.   No facility-administered encounter medications on file as of  07/18/2021.    Patient Active Problem List   Diagnosis Date Noted   Cardiomyopathy [facility])    Nonischemic cardiomyopathy ([place])    Cardiac arrest ([place])    DVT (deep venous thrombosis) ([place]) 05/03/2021   Renal infarct ([place]) 05/03/2021   Hyponatremia 05/03/2021   Chronic retention of urine 05/03/2021   Lumbar spinal stenosis 05/03/2021   Anasarca 05/03/2021   Bilateral pleural effusion 05/03/2021   Weakness of left lower extremity 04/20/2021   Asymptomatic  bacteriuria 04/20/2021   Prolonged QT interval 04/20/2021   Protein-calorie malnutrition, severe 04/03/2021   Gastritis 03/31/2021   Nausea and vomiting 03/31/2021   Iron overload 02/13/2021   Neuropathy 02/13/2021   Elevated LFTs 02/13/2021   Weight loss, non-intentional 02/13/2021   Falls frequently 02/13/2021   GI bleed 08/13/2020   UGIB (upper gastrointestinal bleed) 08/12/2020   Depression 08/12/2020   Hypokalemia 08/12/2020   Essential hypertension, benign 03/23/2019    Conditions to be addressed/monitored: Deep Vein Thrombosis, Spinal Stenosis of Lumbar Spine, Acute on Chronic Combined Systolic and Diastolic CHF; Financial constraints related to medical and food costs  Care Plan : Social Work plan of care  Updates made by Daneen Schick since 07/18/2021 12:00 AM     Problem: Quality of Life (General Plan of Care)      Goal: Identify Financial Resources to Assist with Costs of Daily Living   Start Date: 07/05/2021  Priority: High  Note:   Current Barriers:  Chronic disease management support and education needs related to  Deep Vein Thrombosis, Spinal Stenosis of Lumbar Spine, Cardiomyopathy, and Acute on Chronic Combined Systolic and Diastolic CHF   Recent inpatient stay from 05/02/21 - 05/22/21 due to cardiac arrest Financial constraints due to patients inability to return to her part-time job and spouse staying home on FMLA to care for the patient Social Worker Clinical Goal(s):  patient will work with SW to identify and address any acute and/or chronic care coordination needs related to the self health management of Deep Vein Thrombosis, Spinal Stenosis of Lumbar Spine, Cardiomyopathy, and Acute on Chronic Combined Systolic and Diastolic CHF  Patient will work with SW to gain a better understanding of financial resources to assist with utilities and food costs SW Interventions:  Inter-disciplinary care team collaboration (see longitudinal plan of care) Collaboration with  Glendale Chard, MD regarding development and update of comprehensive plan of care as evidenced by provider attestation and co-signature Successful outbound call placed to the patient to assess goal progression Determined the patient has yet to receive mailed resources Scheduled follow up call for 1.23 to confirm receipt and review resource information  Patient Goals/Self-Care Activities patient will:   -  Apply for financial assistance via paper applications -Engage with RN Care Manager to develop an individualized plan of care -Contact SW as needed prior to next scheduled call Follow Up Plan: The care management team will reach out to the patient again over the next 7 days.        Follow Up Plan: SW will follow up with patient by phone over the next week.      Daneen Schick, BSW, CDP Social Worker, Certified Dementia Practitioner [place] [facility] [place] [facility] [phone]

## 2021-07-19 ENCOUNTER — Ambulatory Visit (HOSPITAL_BASED_OUTPATIENT_CLINIC_OR_DEPARTMENT_OTHER): Payer: Medicare HMO | Admitting: Family

## 2021-07-22 DIAGNOSIS — I469 Cardiac arrest, cause unspecified: Secondary | ICD-10-CM | POA: Diagnosis not present

## 2021-07-22 DIAGNOSIS — I4901 Ventricular fibrillation: Secondary | ICD-10-CM | POA: Diagnosis not present

## 2021-07-22 DIAGNOSIS — I420 Dilated cardiomyopathy: Secondary | ICD-10-CM | POA: Diagnosis not present

## 2021-07-23 ENCOUNTER — Other Ambulatory Visit: Payer: Self-pay

## 2021-07-23 ENCOUNTER — Other Ambulatory Visit: Payer: Medicare HMO

## 2021-07-23 ENCOUNTER — Telehealth: Payer: Medicare HMO

## 2021-07-23 ENCOUNTER — Telehealth: Payer: Self-pay

## 2021-07-23 ENCOUNTER — Ambulatory Visit (INDEPENDENT_AMBULATORY_CARE_PROVIDER_SITE_OTHER): Payer: Medicare HMO

## 2021-07-23 VITALS — HR 93 | Ht 61.0 in

## 2021-07-23 DIAGNOSIS — I469 Cardiac arrest, cause unspecified: Secondary | ICD-10-CM

## 2021-07-23 DIAGNOSIS — I428 Other cardiomyopathies: Secondary | ICD-10-CM | POA: Diagnosis not present

## 2021-07-23 DIAGNOSIS — E876 Hypokalemia: Secondary | ICD-10-CM

## 2021-07-23 NOTE — Telephone Encounter (Signed)
°  Care Management   Follow Up Note   07/23/2021 Name: Debbie Barry MRN: [id number] DOB: 15-Nov-1950   Referred by: Glendale Chard, MD Reason for referral : Chronic Care Management (Unsuccessful call)   An unsuccessful telephone outreach was attempted today. The patient was referred to the case management team for assistance with care management and care coordination.   Follow Up Plan: The care management team will reach out to the patient again over the next 21 days.   Daneen Schick, BSW, CDP Social Worker, Certified Dementia Practitioner [place] [facility] [place] [facility] [phone]

## 2021-07-23 NOTE — Progress Notes (Signed)
Reason for visit: Per Dr. Luther Parody torsemide.  Last lab work was abnormal  Name of MD requesting visit: Dr. Lovena Le  H&P: Pt with history of cardiac arrest in setting of pronounced metabolic abnormality  ROS related to problem: N/A  Assessment and plan per MD: Dr. Lovena Le will review EKG.  Pt also repeated lab work today.   t

## 2021-07-24 LAB — BASIC METABOLIC PANEL
BUN/Creatinine Ratio: 9 — ABNORMAL LOW (ref 12–28)
BUN: 4 mg/dL — ABNORMAL LOW (ref 8–27)
CO2: 23 mmol/L (ref 20–29)
Calcium: 8.9 mg/dL (ref 8.7–10.3)
Chloride: 106 mmol/L (ref 96–106)
Creatinine, Ser: 0.43 mg/dL — ABNORMAL LOW (ref 0.57–1.00)
Glucose: 84 mg/dL (ref 70–99)
Potassium: 4 mmol/L (ref 3.5–5.2)
Sodium: 142 mmol/L (ref 134–144)
eGFR: 106 mL/min/{1.73_m2} (ref >59)

## 2021-07-24 LAB — MAGNESIUM: Magnesium: 1.4 mg/dL — ABNORMAL LOW (ref 1.6–2.3)

## 2021-07-25 ENCOUNTER — Telehealth: Payer: Self-pay

## 2021-07-25 MED ORDER — MAGNESIUM OXIDE 400 MG PO TABS: 400.0000 mg | ORAL_TABLET | Freq: Two times a day (BID) | ORAL | 3 refills | Status: DC

## 2021-07-25 NOTE — Telephone Encounter (Signed)
Outreach made to Pt.  Advised potassium levels were better.  Magnesium was still too low.  Increased magnesium 400 mg to PO BID.  Sent new prescription to pharmacy.  Pt indicates understanding.

## 2021-07-26 ENCOUNTER — Telehealth: Payer: Medicare HMO

## 2021-07-26 ENCOUNTER — Telehealth: Payer: Self-pay

## 2021-07-26 NOTE — Progress Notes (Signed)
This encounter was created in error - please disregard.

## 2021-07-26 NOTE — Telephone Encounter (Signed)
°  Care Management   Follow Up Note   07/26/2021 Name: Debbie Barry MRN: [id number] DOB: 17-Nov-1950   Referred by: Glendale Chard, MD Reason for referral : Chronic Care Management (Initial RN CM Outreach )   An unsuccessful telephone outreach was attempted today. The patient was referred to the case management team for assistance with care management and care coordination.   Follow Up Plan: A HIPPA compliant phone message was left for the patient providing contact information and requesting a return call.   Barb Merino, RN, BSN, CCM Care Management Coordinator [place] [facility]  Direct Phone: [phone]

## 2021-07-31 DIAGNOSIS — I5043 Acute on chronic combined systolic (congestive) and diastolic (congestive) heart failure: Secondary | ICD-10-CM

## 2021-08-02 ENCOUNTER — Telehealth: Payer: Self-pay | Admitting: Internal Medicine

## 2021-08-02 DIAGNOSIS — I469 Cardiac arrest, cause unspecified: Secondary | ICD-10-CM | POA: Diagnosis not present

## 2021-08-02 DIAGNOSIS — I428 Other cardiomyopathies: Secondary | ICD-10-CM

## 2021-08-02 NOTE — Telephone Encounter (Signed)
Patient calling to get results from her Zio patch

## 2021-08-08 NOTE — Telephone Encounter (Signed)
Evans Lance, MD sent to Damian Leavell, RN Her heart monitor looks good. I would like her to undergo a repeat 2D echo in March and follow up with me after to discuss rec's on where to go next with regard to ICD insertion.          Sent mychart message advising of above.  Echo ordered.

## 2021-08-13 ENCOUNTER — Ambulatory Visit (INDEPENDENT_AMBULATORY_CARE_PROVIDER_SITE_OTHER): Payer: Medicare PPO

## 2021-08-13 DIAGNOSIS — M48061 Spinal stenosis, lumbar region without neurogenic claudication: Secondary | ICD-10-CM

## 2021-08-13 DIAGNOSIS — I429 Cardiomyopathy, unspecified: Secondary | ICD-10-CM

## 2021-08-13 DIAGNOSIS — I5043 Acute on chronic combined systolic (congestive) and diastolic (congestive) heart failure: Secondary | ICD-10-CM

## 2021-08-13 DIAGNOSIS — I82402 Acute embolism and thrombosis of unspecified deep veins of left lower extremity: Secondary | ICD-10-CM

## 2021-08-13 DIAGNOSIS — I469 Cardiac arrest, cause unspecified: Secondary | ICD-10-CM

## 2021-08-13 NOTE — Patient Instructions (Signed)
Social Worker Visit Information  Goals we discussed today:  Patient Will:  -Engage with RN Care Manager to develop an individualized plan of care -Contact SW as needed   Materials Provided: No: Patient declined  Patient verbalizes understanding of instructions and care plan provided today and agrees to view in [place]. Active MyChart status confirmed with patient.    Follow Up Plan:  No follow up planned at this time. Please contact me as needed.   Daneen Schick, BSW, CDP Social Worker, Certified Dementia Practitioner [place] [facility] [place] [facility] [phone]

## 2021-08-13 NOTE — Chronic Care Management (AMB) (Signed)
Chronic Care Management    Social Work Note  08/13/2021 Name: Debbie Barry MRN: [id number] DOB: Feb 21, 1951  Debbie Barry is a 71 y.o. year old female who is a primary care patient of Glendale Chard, MD. The CCM team was consulted to assist the patient with chronic disease management and/or care coordination needs related to:  Cardiac Arrest, DVT, Spinal Stenosis of Lumbar Spine, Cardiomyopathy, CHF .   Engaged with patient by telephone for follow up visit in response to provider referral for social work chronic care management and care coordination services.   Consent to Services:  The patient was given information about Chronic Care Management services, agreed to services, and gave verbal consent prior to initiation of services.  Please see initial visit note for detailed documentation.   Patient agreed to services and consent obtained.   Assessment: Review of patient past medical history, allergies, medications, and health status, including review of relevant consultants reports was performed today as part of a comprehensive evaluation and provision of chronic care management and care coordination services.     SDOH (Social Determinants of Health) assessments and interventions performed:    Advanced Directives Status: Not addressed in this encounter.  CCM Care Plan  No Known Allergies  Outpatient Encounter Medications as of 08/13/2021  Medication Sig   acetaminophen (TYLENOL) 325 MG tablet Take 650 mg by mouth every 6 (six) hours as needed for moderate pain.   apixaban (ELIQUIS) 5 MG TABS tablet Take 1 tablet (5 mg total) by mouth 2 (two) times daily.   bismuth subsalicylate (PEPTO BISMOL) 262 MG/15ML suspension Take 30 mLs by mouth every 6 (six) hours as needed for indigestion.   calcium carbonate (TUMS EX) 750 MG chewable tablet Chew 1 tablet by mouth daily as needed for heartburn.   Cholecalciferol (VITAMIN D3) 5000 units CAPS Take 5,000 Units by mouth daily.    feeding supplement (BOOST / RESOURCE BREEZE) LIQD Take 1 Container by mouth 3 (three) times daily between meals.   hydrOXYzine (ATARAX) 10 MG tablet Take 1 tablet (10 mg total) by mouth 3 (three) times daily as needed.   losartan (COZAAR) 25 MG tablet Take 1 tablet (25 mg total) by mouth daily.   magnesium oxide (MAG-OX) 400 MG tablet Take 1 tablet (400 mg total) by mouth 2 (two) times daily.   Multiple Vitamin (MULTIVITAMIN WITH MINERALS) TABS tablet Take 1 tablet by mouth daily.   ondansetron (ZOFRAN ODT) 4 MG disintegrating tablet Take 1 tablet (4 mg total) by mouth every 8 (eight) hours as needed for nausea or vomiting.   pantoprazole (PROTONIX) 40 MG tablet Take 1 tablet (40 mg total) by mouth daily.   potassium chloride SA (KLOR-CON) 20 MEQ tablet Take 1 tablet (20 mEq total) by mouth 2 (two) times daily after a meal.   propranolol ER (INDERAL LA) 60 MG 24 hr capsule Take 1 capsule (60 mg total) by mouth daily.   sertraline (ZOLOFT) 25 MG tablet Take 1 tablet (25 mg total) by mouth daily.   spironolactone (ALDACTONE) 25 MG tablet Take 1 tablet (25 mg total) by mouth daily.   sucralfate (CARAFATE) 1 g tablet Take 1 tablet (1 g total) by mouth 2 (two) times daily.   Tetrahydrozoline HCl (VISINE OP) Place 2 drops into both eyes daily as needed (for dry/irritated eyes).    torsemide (DEMADEX) 20 MG tablet Take 2 tablets (40 mg total) by mouth daily.   No facility-administered encounter medications on file as of 08/13/2021.  Patient Active Problem List   Diagnosis Date Noted   Cardiomyopathy [facility])    Nonischemic cardiomyopathy ([place])    Cardiac arrest ([place])    DVT (deep venous thrombosis) ([place]) 05/03/2021   Renal infarct ([place]) 05/03/2021   Hyponatremia 05/03/2021   Chronic retention of urine 05/03/2021   Lumbar spinal stenosis 05/03/2021   Anasarca 05/03/2021   Bilateral pleural effusion 05/03/2021   Weakness of left lower extremity 04/20/2021   Asymptomatic bacteriuria 04/20/2021    Prolonged QT interval 04/20/2021   Protein-calorie malnutrition, severe 04/03/2021   Gastritis 03/31/2021   Nausea and vomiting 03/31/2021   Iron overload 02/13/2021   Neuropathy 02/13/2021   Elevated LFTs 02/13/2021   Weight loss, non-intentional 02/13/2021   Falls frequently 02/13/2021   GI bleed 08/13/2020   UGIB (upper gastrointestinal bleed) 08/12/2020   Depression 08/12/2020   Hypokalemia 08/12/2020   Essential hypertension, benign 03/23/2019    Conditions to be addressed/monitored:  Cardiac Arrest, DVT, Spinal Stenosis of Lumbar Spine, Cardiomyopathy, CHF  Care Plan : Social Work plan of care  Updates made by Daneen Schick since 08/13/2021 12:00 AM  Completed 08/13/2021   Problem: Quality of Life (General Plan of Care) Resolved 08/13/2021     Goal: Identify Financial Resources to Assist with Costs of Daily Living Completed 08/13/2021  Start Date: 07/05/2021  Priority: High  Note:   Current Barriers:  Chronic disease management support and education needs related to  Deep Vein Thrombosis, Spinal Stenosis of Lumbar Spine, Cardiomyopathy, and Acute on Chronic Combined Systolic and Diastolic CHF   Recent inpatient stay from 05/02/21 - 05/22/21 due to cardiac arrest Financial constraints due to patients inability to return to her part-time job and spouse staying home on FMLA to care for the patient Social Worker Clinical Goal(s):  patient will work with SW to identify and address any acute and/or chronic care coordination needs related to the self health management of Deep Vein Thrombosis, Spinal Stenosis of Lumbar Spine, Cardiomyopathy, and Acute on Chronic Combined Systolic and Diastolic CHF  Patient will work with SW to gain a better understanding of financial resources to assist with utilities and food costs SW Interventions:  Inter-disciplinary care team collaboration (see longitudinal plan of care) Collaboration with Glendale Chard, MD regarding development and update of  comprehensive plan of care as evidenced by provider attestation and co-signature Telephonic visit completed with the patient to determine the patient has decided to refrain from applying for public assistance considering she will return to work in March Assessed for care coordination needs - no challenges identified at this time Reviewed the patient will be contacted by [place] to assess for clinical needs  Patient Goals/Self-Care Activities patient will:  -Engage with RN Care Manager to develop an individualized plan of care -Contact SW as needed         Follow Up Plan:  No SW follow up planned at this time. The patient will engage with RN Care Manager to address care management needs.      Daneen Schick, BSW, CDP Social Worker, Certified Dementia Practitioner [place] [facility] [phone]

## 2021-08-14 ENCOUNTER — Ambulatory Visit: Payer: Self-pay

## 2021-08-14 ENCOUNTER — Telehealth: Payer: Medicare HMO

## 2021-08-14 ENCOUNTER — Telehealth: Payer: Self-pay | Admitting: Internal Medicine

## 2021-08-14 DIAGNOSIS — I5043 Acute on chronic combined systolic (congestive) and diastolic (congestive) heart failure: Secondary | ICD-10-CM

## 2021-08-14 DIAGNOSIS — I469 Cardiac arrest, cause unspecified: Secondary | ICD-10-CM

## 2021-08-14 DIAGNOSIS — I82402 Acute embolism and thrombosis of unspecified deep veins of left lower extremity: Secondary | ICD-10-CM

## 2021-08-14 DIAGNOSIS — M48061 Spinal stenosis, lumbar region without neurogenic claudication: Secondary | ICD-10-CM

## 2021-08-14 DIAGNOSIS — I429 Cardiomyopathy, unspecified: Secondary | ICD-10-CM

## 2021-08-14 NOTE — Telephone Encounter (Signed)
Will forward to Pharmacists for recommendations ./cy

## 2021-08-14 NOTE — Telephone Encounter (Signed)
Pt c/o medication issue:  1. Name of Medication: Magnesium Oxide  2. How are you currently taking this medication (dosage and times per day)? 2 times a day  3. Are you having a reaction (difficulty breathing--STAT)?   4. What is your medication issue? Having a lot of diarrhea- have to take Loperamide HCL 2 mg to stop the diarrhea- she says she would like  to see if she take Ortate to replace the Magnesium she is taking now

## 2021-08-15 NOTE — Patient Instructions (Signed)
Visit Information  Thank you for taking time to visit with me today. Please don't hesitate to contact me if I can be of assistance to you before our next scheduled telephone appointment.  Following are the goals we discussed today:  (Copy and paste patient goals from clinical care plan here)  Our next appointment is by telephone on 3/9 at 12:45 PM   Please call the care guide team at [phone] if you need to cancel or reschedule your appointment.   If you are experiencing a Mental Health or [place] or need someone to talk to, please call [phone]-TALK (toll free, 24 hour hotline)   Patient verbalizes understanding of instructions and care plan provided today and agrees to view in [place]. Active MyChart status confirmed with patient.    Barb Merino, RN, BSN, CCM Care Management Coordinator [place] [facility]  Direct Phone: [phone]

## 2021-08-15 NOTE — Chronic Care Management (AMB) (Signed)
Chronic Care Management   CCM RN Visit Note  08/14/2021 Name: Debbie Barry MRN: [id number] DOB: September 27, 1950  Subjective: Debbie Barry is a 71 y.o. year old female who is a primary care patient of Glendale Chard, MD. The care management team was consulted for assistance with disease management and care coordination needs.    Engaged with patient by telephone for initial visit in response to provider referral for case management and/or care coordination services.   Consent to Services:  The patient was given information about Chronic Care Management services, agreed to services, and gave verbal consent prior to initiation of services.  Please see initial visit note for detailed documentation.   Patient agreed to services and verbal consent obtained.   Assessment: Review of patient past medical history, allergies, medications, health status, including review of consultants reports, laboratory and other test data, was performed as part of comprehensive evaluation and provision of chronic care management services.   SDOH (Social Determinants of Health) assessments and interventions performed:  Yes, no acute challenges   CCM Care Plan  No Known Allergies  Outpatient Encounter Medications as of 08/14/2021  Medication Sig   acetaminophen (TYLENOL) 325 MG tablet Take 650 mg by mouth every 6 (six) hours as needed for moderate pain.   apixaban (ELIQUIS) 5 MG TABS tablet Take 1 tablet (5 mg total) by mouth 2 (two) times daily.   bismuth subsalicylate (PEPTO BISMOL) 262 MG/15ML suspension Take 30 mLs by mouth every 6 (six) hours as needed for indigestion.   calcium carbonate (TUMS EX) 750 MG chewable tablet Chew 1 tablet by mouth daily as needed for heartburn.   Cholecalciferol (VITAMIN D3) 5000 units CAPS Take 5,000 Units by mouth daily.   feeding supplement (BOOST / RESOURCE BREEZE) LIQD Take 1 Container by mouth 3 (three) times daily between meals.   hydrOXYzine (ATARAX) 10 MG tablet  Take 1 tablet (10 mg total) by mouth 3 (three) times daily as needed.   losartan (COZAAR) 25 MG tablet Take 1 tablet (25 mg total) by mouth daily.   magnesium oxide (MAG-OX) 400 MG tablet Take 1 tablet (400 mg total) by mouth 2 (two) times daily.   Multiple Vitamin (MULTIVITAMIN WITH MINERALS) TABS tablet Take 1 tablet by mouth daily.   ondansetron (ZOFRAN ODT) 4 MG disintegrating tablet Take 1 tablet (4 mg total) by mouth every 8 (eight) hours as needed for nausea or vomiting.   pantoprazole (PROTONIX) 40 MG tablet Take 1 tablet (40 mg total) by mouth daily.   potassium chloride SA (KLOR-CON) 20 MEQ tablet Take 1 tablet (20 mEq total) by mouth 2 (two) times daily after a meal.   propranolol ER (INDERAL LA) 60 MG 24 hr capsule Take 1 capsule (60 mg total) by mouth daily.   sertraline (ZOLOFT) 25 MG tablet Take 1 tablet (25 mg total) by mouth daily.   spironolactone (ALDACTONE) 25 MG tablet Take 1 tablet (25 mg total) by mouth daily.   sucralfate (CARAFATE) 1 g tablet Take 1 tablet (1 g total) by mouth 2 (two) times daily.   Tetrahydrozoline HCl (VISINE OP) Place 2 drops into both eyes daily as needed (for dry/irritated eyes).    torsemide (DEMADEX) 20 MG tablet Take 2 tablets (40 mg total) by mouth daily.   No facility-administered encounter medications on file as of 08/14/2021.    Patient Active Problem List   Diagnosis Date Noted   Cardiomyopathy [facility])    Nonischemic cardiomyopathy ([place])    Cardiac arrest ([place])  DVT (deep venous thrombosis) ([place]) 05/03/2021   Renal infarct ([place]) 05/03/2021   Hyponatremia 05/03/2021   Chronic retention of urine 05/03/2021   Lumbar spinal stenosis 05/03/2021   Anasarca 05/03/2021   Bilateral pleural effusion 05/03/2021   Weakness of left lower extremity 04/20/2021   Asymptomatic bacteriuria 04/20/2021   Prolonged QT interval 04/20/2021   Protein-calorie malnutrition, severe 04/03/2021   Gastritis 03/31/2021   Nausea and vomiting 03/31/2021   Iron  overload 02/13/2021   Neuropathy 02/13/2021   Elevated LFTs 02/13/2021   Weight loss, non-intentional 02/13/2021   Falls frequently 02/13/2021   GI bleed 08/13/2020   UGIB (upper gastrointestinal bleed) 08/12/2020   Depression 08/12/2020   Hypokalemia 08/12/2020   Essential hypertension, benign 03/23/2019    Conditions to be addressed/monitored: Cardiac arrest, Deep vein thrombosis, spinal stenosis of lumbar spine,acute on chronic combined systolic and diastolic CHF, Cardiomyopathy  Care Plan : RN Care Manager Plan of Care  Updates made by Lynne Logan, RN since 08/14/2021 12:00 AM     Problem: No Plan of Care Established for management of chronic disease states (Cardiac arrest, Deep vein thrombosis, spinal stenosis of lumbar spine,acute on chronic combined systolic and diastolic CHF, Cardiomyopath)   Priority: High     Long-Range Goal: Establishment of Plan of Care for Management of Chronic disease states (Cardiac arrest, Deep vein thrombosis, spinal stenosis of lumbar spine,acute on chronic combined systolic and diastolic CHF, Cardiomyopath)   Start Date: 07/05/2021  Expected End Date: 07/05/2022  Recent Progress: On track  Priority: High  Note:   Current Barriers:  Knowledge Deficits related to plan of care for management of Cardiac arrest, Deep vein thrombosis, spinal stenosis of lumbar spine,acute on chronic combined systolic and diastolic CHF, Cardiomyopathy  Chronic Disease Management support and education needs related to Cardiac arrest, Deep vein thrombosis, spinal stenosis of lumbar spine,acute on chronic combined systolic and diastolic CHF, Cardiomyopathy    RNCM Clinical Goal(s):  Patient will verbalize basic understanding of  Cardiac arrest, Deep vein thrombosis, spinal stenosis of lumbar spine,acute on chronic combined systolic and diastolic CHF, Cardiomyopathy disease process and self health management plan as evidenced by patient will report having no disease  exacerbations related to her chronic disease states as listed above  take all medications exactly as prescribed and will call provider for medication related questions as evidenced by demonstrate improved understanding of prescribed medications and rationale for usage as evidenced by patient teach back demonstrate Improved health management independence as evidenced by patient will report 100% adherence to her prescribed treatment plan  continue to work with RN Care Manager to address care management and care coordination needs related to  Cardiac arrest, Deep vein thrombosis, spinal stenosis of lumbar spine,acute on chronic combined systolic and diastolic CHF, Cardiomyopathy as evidenced by adherence to CM Team Scheduled appointments demonstrate ongoing self health care management ability   as evidenced by    through collaboration with RN Care manager, provider, and care team.   Interventions: 1:1 collaboration with primary care provider regarding development and update of comprehensive plan of care as evidenced by provider attestation and co-signature Inter-disciplinary care team collaboration (see longitudinal plan of care) Evaluation of current treatment plan related to  self management and patient's adherence to plan as established by provider  Interdisciplinary Collaboration Interventions:  (Status: Goal on track:  Yes.) Long Term Goal   Collaborated with BSW to initiate plan of care to address needs related to Limited access to food and Lack of essential utilities -  heat* in patient with  Cardiac arrest, Deep vein thrombosis, spinal stenosis of lumbar spine,acute on chronic combined systolic and diastolic CHF, Cardiomyopathy Collaboration with Glendale Chard, MD regarding development and update of comprehensive plan of care as evidenced by provider attestation and co-signature Inter-disciplinary care team collaboration   Nonischemic Cardiomyopathy Interventions: (Status:  New goal.)  Long Term  Goal Evaluation of current treatment plan related to  Nonischemic Cardiomyopathy , self-management and patient's adherence to plan as established by provider Review of patient status, including review of consultant's reports, relevant laboratory and other test results, and medications completed Reviewed medications with patient and discussed importance of medication adherence, determined patient stopped taking Magnesium due to having diarrhea while taking this medication Educated patient on the importance to communicate this with the prescribing provider for further recommendations, educated on metabolic imbalance thought to be the reason for her cardiac arrest Determined patient agree's to contact Dr. Lovena Le today following this call to notify him she stopped Mg and request for additional recommendations Reviewed and discussed patient's recent Cardiac follow up completed with Dr. Lovena Le completed on 07/06/21 with the following Assessment/Plan noted:  Assess/Plan: Prolonged QT in the setting of metabolic derangements - she will continue her beta blocker, avoid QT prolonging meds and maintain adequate electrolytes. We will check her labs today.  VF arrest - secondary to above. We will check her electrolytes and I will ask her to wear a Zio monitor.  Diarrhea/Vomiting - this is much improved.  Chronic systolic heart failure - her EF was initially low but improved. She will continue her current meds. Could consider switching losartan to entresto in the future. Educated patient on daily weights, provided verbal and written CHF action plan instructions with green, yellow and red zones and when to seek medical attention Reviewed scheduled/upcoming provider appointments including: [facility]-CVD follow up with Loel Dubonnet NP scheduled for 08/27/21 @1 :30 PM; Echocardiogram scheduled for 09/05/21 @7 :15 AM Discussed plans with patient for ongoing care management follow up and provided patient with direct contact  information for care management team  DVT of RLE extending to iliac region:  (Status:  New goal.)  Long Term Goal Evaluation of current treatment plan related to  DVT , self-management and patient's adherence to plan as established by provider Review of patient status, including review of consultant's reports, relevant laboratory and other test results, and medications completed Reviewed medications with patient and discussed importance of medication adherence, including indication, dosage and frequency of Apixaban, assessed for SE, patient denies  Educated patient on the importance to avoid taking ASA, Aleve or other NSAIDS while taking Apixaban Determined patient has not received additional follow up to evaluate/treat this DVT since being discharged home from the hospital when originally diagnosed Collaborated with PCP to request a follow up appointment for evaluation/treatment of DVT, patient advised the office will contact her to schedule a follow up appointment  Discussed plans with patient for ongoing care management follow up and provided patient with direct contact information for care management team   Pain Interventions:  (Status:  New goal.) Long Term Goal Pain assessment performed Medications reviewed Reviewed provider established plan for pain management Discussed importance of adherence to all scheduled medical appointments Counseled on the importance of reporting any/all new or changed pain symptoms or management strategies to pain management provider Advised patient to report to care team affect of pain on daily activities Discussed use of relaxation techniques and/or diversional activities to assist with pain reduction (distraction, imagery, relaxation, massage, acupressure,  TENS, heat, and cold application Reviewed with patient prescribed pharmacological and nonpharmacological pain relief strategies Advised patient to discuss Orthopedic referral with provider  Collaborated with  PCP regarding patient's persistent symptoms of low back pain and need for an Orthopedic referral  Discussed plans with patient for ongoing care management follow up and provided patient with direct contact information for care management team   Patient Goals/Self-Care Activities: Take all medications as prescribed Attend all scheduled provider appointments Call pharmacy for medication refills 3-7 days in advance of running out of medications Perform all self care activities independently  Perform IADL's (shopping, preparing meals, housekeeping, managing finances) independently Call provider office for new concerns or questions  Call Dr. Tanna Furry office to advise you stopped Magnesium and ask for recommendations Call Dr. Baird [facility] office to schedule a PCP follow up to discuss DVT management and Orthopedic referral   Follow Up Plan:  Telephone follow up appointment with care management team member scheduled for:  09/06/21     Barb Merino, RN, BSN, CCM Care Management Coordinator [place] [facility]  Direct Phone: [phone]

## 2021-08-15 NOTE — Telephone Encounter (Signed)
Please clarify pt's message. Ortate is not a medication or a word, unclear what she is asking.

## 2021-08-15 NOTE — Telephone Encounter (Signed)
Sent [employer].  Advised Pt to decrease magnesium to once a day and let this nurse know if no improvement.

## 2021-08-16 ENCOUNTER — Telehealth: Payer: Self-pay

## 2021-08-16 NOTE — Telephone Encounter (Signed)
The pt was scheduled for an appt for a follow-up on her DVT. The pt said that she would need a referral.

## 2021-08-20 ENCOUNTER — Encounter: Payer: Medicare PPO | Admitting: Nurse Practitioner

## 2021-08-20 NOTE — Progress Notes (Signed)
No show

## 2021-08-27 ENCOUNTER — Encounter (HOSPITAL_BASED_OUTPATIENT_CLINIC_OR_DEPARTMENT_OTHER): Payer: Self-pay | Admitting: Nurse Practitioner

## 2021-08-27 ENCOUNTER — Ambulatory Visit (HOSPITAL_BASED_OUTPATIENT_CLINIC_OR_DEPARTMENT_OTHER): Payer: Medicare PPO | Admitting: Nurse Practitioner

## 2021-08-27 ENCOUNTER — Other Ambulatory Visit: Payer: Self-pay

## 2021-08-27 VITALS — BP 130/78 | HR 97 | Ht 61.0 in | Wt 146.5 lb

## 2021-08-27 DIAGNOSIS — Z8674 Personal history of sudden cardiac arrest: Secondary | ICD-10-CM

## 2021-08-27 DIAGNOSIS — E43 Unspecified severe protein-calorie malnutrition: Secondary | ICD-10-CM

## 2021-08-27 DIAGNOSIS — R197 Diarrhea, unspecified: Secondary | ICD-10-CM | POA: Diagnosis not present

## 2021-08-27 DIAGNOSIS — I428 Other cardiomyopathies: Secondary | ICD-10-CM | POA: Diagnosis not present

## 2021-08-27 DIAGNOSIS — I82409 Acute embolism and thrombosis of unspecified deep veins of unspecified lower extremity: Secondary | ICD-10-CM

## 2021-08-27 DIAGNOSIS — M25512 Pain in left shoulder: Secondary | ICD-10-CM | POA: Diagnosis not present

## 2021-08-27 DIAGNOSIS — I5022 Chronic systolic (congestive) heart failure: Secondary | ICD-10-CM | POA: Diagnosis not present

## 2021-08-27 DIAGNOSIS — E876 Hypokalemia: Secondary | ICD-10-CM

## 2021-08-27 DIAGNOSIS — M4800 Spinal stenosis, site unspecified: Secondary | ICD-10-CM | POA: Diagnosis not present

## 2021-08-27 MED ORDER — ENTRESTO 24-26 MG PO TABS: 1.0000 | ORAL_TABLET | Freq: Two times a day (BID) | ORAL | 3 refills | Status: DC

## 2021-08-27 MED ORDER — TORSEMIDE 20 MG PO TABS: 20.0000 mg | ORAL_TABLET | Freq: Two times a day (BID) | ORAL | 3 refills | Status: DC

## 2021-08-27 NOTE — Patient Instructions (Addendum)
Medication Instructions:  Your physician has recommended you make the following change in your medication:   Start: Torsemide 20mg  daily   Stop: Losartan   Start: Entresto 24-26mg  twice daily   *If you need a refill on your cardiac medications before your next appointment, please call your pharmacy*   Lab Work: Your physician recommends that you return for lab work Today- BMET, [place]   Please return for Lab work in 1 week for repeat BMET. You may come to the...   [facility] (3rd floor) [address] [place], [place] [address]  Open: 8am-Noon and 1pm-4:30pm   [facility]- Any location  **no appointments needed**  If you have labs (blood work) drawn today and your tests are completely normal, you will receive your results only by: [employer] (if you have MyChart) OR A paper copy in the mail If you have any lab test that is abnormal or we need to change your treatment, we will call you to review the results.   Testing/Procedures: Please proceed with Echocardiogram scheduled in March    Follow-Up: At [facility], you and your health needs are our priority.  As part of our continuing mission to provide you with exceptional heart care, we have created designated Provider Care Teams.  These Care Teams include your primary Cardiologist (physician) and Advanced Practice Providers (APPs -  Physician Assistants and Nurse Practitioners) who all work together to provide you with the care you need, when you need it.  We recommend signing up for the patient portal called "MyChart".  Sign up information is provided on this After Visit Summary.  MyChart is used to connect with patients for Virtual Visits (Telemedicine).  Patients are able to view lab/test results, encounter notes, upcoming appointments, etc.  Non-urgent messages can be sent to your provider as well.   To learn more about what you can  do with MyChart, go to [email or web address].    Your next appointment:   Follow up with Dr. Lovena Le in about one month after echocardiogram   Follow up with Dr. Audie Box or Diona Browner, NP in 3 months   Other Instructions Please follow up with your primary care provider about swelling in your leg and continue your Eliquis. If you need a new PCP, see below   Recommend establishing with a primary care provider.  You may call [facility] @ [phone] for a list of primary care providers in your area or visit their website [email or web address] Please have any insurance card available before calling or going online.    Recommend weighing daily and keeping a log. Please call our office if you have weight gain of 2 pounds overnight or 5 pounds in 1 week.   Date  Time Weight

## 2021-08-27 NOTE — Progress Notes (Signed)
Office Visit    Patient Name: Debbie Barry Date of Encounter: 08/27/2021  Primary Care Provider:  Glendale Chard, MD Primary Cardiologist:  Evalina Field, MD  Chief Complaint    71 year old female with a history of PMVT/VF arrest/prolonged QT in the setting of severe metabolic abnormalities, chronic systolic heart failure, hypertension, fatty liver, GERD, severe spinal stenosis, PVD, osteomyelitis s/p amputation to R 3d toe, and chronic protein calorie malnutrition who presents for follow-up related to chronic systolic heart failure, hypertension, and prolonged QT.  Past Medical History    Past Medical History:  Diagnosis Date   Fatty liver    GERD (gastroesophageal reflux disease)    Hypertension    Osteomyelitis ([place])    right third toe   Peripheral vascular disease ([place])    Ulcer 08/14/20   Past Surgical History:  Procedure Laterality Date   AMPUTATION Right 08/08/2017   Procedure: AMPUTATION RIGHT 3RD TOE;  Surgeon: Newt Minion, MD;  Location: [facility];  Service: Orthopedics;  Laterality: Right;   BIOPSY  08/14/2020   Procedure: BIOPSY;  Surgeon: Clarene Essex, MD;  Location: [facility];  Service: Endoscopy;;   Bunionectomy Right 2006     Right   Bunionectony Left 2006      August   COLONOSCOPY N/A 01/21/2014   Procedure: COLONOSCOPY;  Surgeon: Beryle Beams, MD;  Location: [facility];  Service: Endoscopy;  Laterality: N/A;   ESOPHAGOGASTRODUODENOSCOPY (EGD) WITH PROPOFOL N/A 08/14/2020   Procedure: ESOPHAGOGASTRODUODENOSCOPY (EGD) WITH PROPOFOL;  Surgeon: Clarene Essex, MD;  Location: [facility];  Service: Endoscopy;  Laterality: N/A;   IR THORACENTESIS ASP PLEURAL SPACE W/IMG GUIDE  05/03/2021   RIGHT/LEFT HEART CATH AND CORONARY ANGIOGRAPHY N/A 05/11/2021   Procedure: RIGHT/LEFT HEART CATH AND CORONARY ANGIOGRAPHY;  Surgeon: Troy Sine, MD;  Location: [facility];  Service: Cardiovascular;  Laterality: N/A;   TUBAL LIGATION     WISDOM TOOTH  EXTRACTION      Allergies  No Known Allergies  History of Present Illness    71 year old female with the above past medical history including PMVT/VF arrest/prolonged QT in the setting of severe metabolic abnormalities, chronic systolic heart failure, hypertension, fatty liver, GERD, severe spinal stenosis, PVD, osteomyelitis s/p amputation to R 3d toe, and chronic protein calorie malnutrition.  She was hospitalized in November 2022 following a PMVT/VT arrest in the setting of marked QT prolongation due to magnesium level of 0.8 and potassium level of 2.6. Echocardiogram postarrest showed EF 25 to 30%, cath with no obstructive CAD (o-pLAD 20%). She was noted to have RLE DVT as well as an acute renal infarct. She was started on Eliquis. She was discharged home with a LifeVest.  Outpatient monitor showed NSR with sinus tachycardia, runs of SVT, atrial tachycardia, no prolonged pauses, rare PACs, no atrial fibrillation. She was last seen in the office on 07/06/2021 and was doing well from a cardiac standpoint.  She presents today for follow-up accompanied by her husband. Since her last visit he has been stable overall from a cardiac standpoint.  She states she stopped taking her torsemide as it was making her urinate frequently.  She denies any rapid weight gain, though her weight is up overall since her last visit in the setting of increased calorie intake.  She does have bilateral lower extremity edema, L>R, stable since her hospital discharge.  She stopped taking her magnesium as it was continuing to give her diarrhea.  Instead, she ordered an over-the-counter magnesium  500 mg and is taking 1 tablet daily with an improvement in her diarrhea. Additionally, she reports left shoulder pain that started following HHPT.  She states it is painful to raise her arm She has not followed up with her primary care doctor since her hospital discharge.  Otherwise, she denies any specific concerns or complaints  today.  Home Medications    Current Outpatient Medications  Medication Sig Dispense Refill   acetaminophen (TYLENOL) 325 MG tablet Take 650 mg by mouth every 6 (six) hours as needed for moderate pain.     apixaban (ELIQUIS) 5 MG TABS tablet Take 1 tablet (5 mg total) by mouth 2 (two) times daily. 60 tablet 2   bismuth subsalicylate (PEPTO BISMOL) 262 MG/15ML suspension Take 30 mLs by mouth every 6 (six) hours as needed for indigestion.     calcium carbonate (TUMS EX) 750 MG chewable tablet Chew 1 tablet by mouth daily as needed for heartburn.     Cholecalciferol (VITAMIN D3) 5000 units CAPS Take 5,000 Units by mouth daily.     Magnesium 500 MG TABS Take 1 tablet by mouth daily.     Multiple Vitamin (MULTIVITAMIN WITH MINERALS) TABS tablet Take 1 tablet by mouth daily.     ondansetron (ZOFRAN ODT) 4 MG disintegrating tablet Take 1 tablet (4 mg total) by mouth every 8 (eight) hours as needed for nausea or vomiting. 20 tablet 0   pantoprazole (PROTONIX) 40 MG tablet Take 1 tablet (40 mg total) by mouth daily. 30 tablet 0   potassium chloride SA (KLOR-CON) 20 MEQ tablet Take 1 tablet (20 mEq total) by mouth 2 (two) times daily after a meal. 60 tablet 2   propranolol ER (INDERAL LA) 60 MG 24 hr capsule Take 1 capsule (60 mg total) by mouth daily. 30 capsule 11   sacubitril-valsartan (ENTRESTO) 24-26 MG Take 1 tablet by mouth 2 (two) times daily. 180 tablet 3   sertraline (ZOLOFT) 25 MG tablet Take 1 tablet (25 mg total) by mouth daily. 90 tablet 1   spironolactone (ALDACTONE) 25 MG tablet Take 1 tablet (25 mg total) by mouth daily. 30 tablet 2   sucralfate (CARAFATE) 1 g tablet Take 1 tablet (1 g total) by mouth 2 (two) times daily. 60 tablet 2   Tetrahydrozoline HCl (VISINE OP) Place 2 drops into both eyes daily as needed (for dry/irritated eyes).      torsemide (DEMADEX) 20 MG tablet Take 1 tablet (20 mg total) by mouth 2 (two) times daily. 180 tablet 3   No current facility-administered  medications for this visit.     Review of Systems    She denies chest pain, palpitations, dyspnea, pnd, orthopnea, n, v, dizziness, syncope, weight gain, or early satiety. All other systems reviewed and are otherwise negative except as noted above.   Physical Exam    VS:  BP 130/78 (BP Location: Left Arm, Patient Position: Sitting, Cuff Size: Large)    Pulse 97    Ht 5\' 1"  (1.549 m)    Wt 146 lb 8 oz (66.5 kg)    SpO2 (!) 9%    BMI 27.68 kg/m  GEN: Well nourished, well developed, in no acute distress. HEENT: normal. Neck: Supple, no JVD, carotid bruits, or masses. Cardiac: RRR, no murmurs, rubs, or gallops. No clubbing, cyanosis, bilateral non-pitting lower extremity edema.  Radials/DP/PT 2+ and equal bilaterally.  Respiratory:  Respirations regular and unlabored, clear to auscultation bilaterally. GI: Soft, nontender, nondistended, BS + x 4. MS: no deformity  or atrophy. Skin: warm and dry, no rash. Neuro:  Strength and sensation are intact. Psych: Normal affect.  Accessory Clinical Findings    ECG personally reviewed by me today - No EKG in office today. Lab Results  Component Value Date   WBC 7.4 05/21/2021   HGB 9.4 (L) 05/21/2021   HCT 28.5 (L) 05/21/2021   MCV 100.0 05/21/2021   PLT 397 05/21/2021   Lab Results  Component Value Date   CREATININE 0.43 (L) 07/23/2021   BUN 4 (L) 07/23/2021   NA 142 07/23/2021   K 4.0 07/23/2021   CL 106 07/23/2021   CO2 23 07/23/2021   Lab Results  Component Value Date   ALT 18 05/17/2021   AST 23 05/17/2021   GGT 126 (H) 03/14/2020   ALKPHOS 108 05/17/2021   BILITOT 0.2 (L) 05/17/2021   Lab Results  Component Value Date   CHOL 146 08/22/2020   HDL 53 08/22/2020   LDLCALC 77 08/22/2020   TRIG 123 05/14/2021   CHOLHDL 2.8 08/22/2020    Lab Results  Component Value Date   HGBA1C 4.5 (L) 02/01/2021    Assessment & Plan   1.  Prolonged QT/PMVT/VF arrest: Hospitalized in November 2022 following a PMVT/VT arrest in the  setting of marked QT prolongation due to magnesium level of 0.8 and potassium level of 2.6. Echo showed EF 25 to 30%, cath showed no obstructive CAD (o-pLAD 20%).  She was discharged with a LifeVest. Outpatient monitor showed NSR with sinus tachycardia, runs of SVT, atrial tachycardia, no prolonged pauses, rare PACs, no atrial fibrillation. Lifevest was subsequently discontinued.  She denies chest pain, palpitation, presyncope, syncope. Mg was 1.4 most recent outpatient visit, potassium was 4.0.  Repeat BMET/Mg today.  Repeat echocardiogram scheduled for March 2023.  Follow-up with Dr. Lovena Le to discuss echo results, possible consideration for ICD.  2. Chronic systolic heart failure/NICM: Recent echo as above.  Repeat echo scheduled as above.  She stopped taking her torsemide due to frequent urination.  Does have bilateral lower extremity edema, R>L, stable since hospital discharge, she reports stable home weights.  Generally well compensated.  Restart torsemide 20 mg daily.  Stop losartan and start Entresto 24/26. 30-day free card provided, samples given.  Repeat BMET in 2 weeks.  Discussed daily weights, sodium restrictions.  Continue propanolol, spironolactone. Consider titration of Entresto versus addition of SGLT2i at follow-up to further maximize GDMT.   3.  Diarrhea/vomiting: Improved.  He did switch her magnesium to over-the-counter magnesium 500 mg daily.  She states her diarrhea improved with this change. Repeat Mg today as above.  4.  H/o DVT/renal infarct: She does report ongoing left lower extremity edema and occasional pains in her left leg, unchanged since her hospital discharge.  Lower extremity Dopplers during recent hospitalization were consistent with age-indeterminate right lower extremity DVT.  Recent cardiac monitor with no evidence of atrial fibrillation as above.  She is taking Eliquis 5 mg twice daily with no signs of bleeding.  Recommended follow-up with PCP for ongoing management of  DVT.   5. Spinal stenosis/L shoulder pain: She has had overall physical deconditioning and was working with home health PT.  She declined SNF following hospital discharge.  She reports that since working with PT she has had left shoulder pain and finds it painful to raise her left arm.  Recommend follow-up with PCP, increase activity as tolerated.   6. Severe malnutrition: GI consulted during recent hospitalization for malnutrition with concern about protein-losing  enteropathy. Extensive work-up significant for low vitamin A, transferrin and ceruloplasmin, negative for C. difficile, GIP, Giardia, crypto, A1A, and celiac disease.  Follow-up with PCP, GI as recommended.  7. Disposition: F/u with Dr. Lovena Le in 1-2 months, f/u with general cardiology in 3 months.   Lenna Sciara, NP 08/27/2021, 2:39 PM

## 2021-08-28 LAB — BASIC METABOLIC PANEL
BUN/Creatinine Ratio: 6 — ABNORMAL LOW (ref 12–28)
BUN: 4 mg/dL — ABNORMAL LOW (ref 8–27)
CO2: 23 mmol/L (ref 20–29)
Calcium: 9.3 mg/dL (ref 8.7–10.3)
Chloride: 104 mmol/L (ref 96–106)
Creatinine, Ser: 0.66 mg/dL (ref 0.57–1.00)
Glucose: 78 mg/dL (ref 70–99)
Potassium: 5 mmol/L (ref 3.5–5.2)
Sodium: 144 mmol/L (ref 134–144)
eGFR: 95 mL/min/{1.73_m2} (ref >59)

## 2021-08-28 LAB — MAGNESIUM: Magnesium: 1.8 mg/dL (ref 1.6–2.3)

## 2021-08-29 ENCOUNTER — Telehealth: Payer: Self-pay

## 2021-08-29 NOTE — Telephone Encounter (Signed)
I called the pt to let her know that her info has been sent to [facility] because the pt has trouble making to her appts so that she can be evaluated.  ?

## 2021-08-29 NOTE — Telephone Encounter (Signed)
Patient notified

## 2021-09-05 ENCOUNTER — Encounter (HOSPITAL_COMMUNITY): Payer: Self-pay

## 2021-09-05 ENCOUNTER — Ambulatory Visit (HOSPITAL_COMMUNITY): Payer: Medicare PPO | Attending: Cardiovascular Disease

## 2021-09-05 DIAGNOSIS — I428 Other cardiomyopathies: Secondary | ICD-10-CM | POA: Diagnosis not present

## 2021-09-06 ENCOUNTER — Telehealth: Payer: Medicare PPO

## 2021-09-06 LAB — BASIC METABOLIC PANEL
BUN/Creatinine Ratio: 11 — ABNORMAL LOW (ref 12–28)
BUN: 7 mg/dL — ABNORMAL LOW (ref 8–27)
CO2: 23 mmol/L (ref 20–29)
Calcium: 8.1 mg/dL — ABNORMAL LOW (ref 8.7–10.3)
Chloride: 104 mmol/L (ref 96–106)
Creatinine, Ser: 0.62 mg/dL (ref 0.57–1.00)
Glucose: 89 mg/dL (ref 70–99)
Potassium: 4.8 mmol/L (ref 3.5–5.2)
Sodium: 145 mmol/L — ABNORMAL HIGH (ref 134–144)
eGFR: 97 mL/min/{1.73_m2} (ref >59)

## 2021-09-11 ENCOUNTER — Other Ambulatory Visit: Payer: Self-pay

## 2021-09-11 ENCOUNTER — Ambulatory Visit (HOSPITAL_COMMUNITY): Payer: Medicare PPO | Attending: Cardiovascular Disease

## 2021-09-11 DIAGNOSIS — I428 Other cardiomyopathies: Secondary | ICD-10-CM | POA: Diagnosis not present

## 2021-09-11 LAB — ECHOCARDIOGRAM COMPLETE
Area-P 1/2: 6.17 cm2
MV M vel: 5.07 m/s
MV Peak grad: 102.8 mmHg
S' Lateral: 3.6 cm

## 2021-09-13 ENCOUNTER — Telehealth: Payer: Self-pay | Admitting: Internal Medicine

## 2021-09-13 NOTE — Telephone Encounter (Signed)
Informed patient of results and verbal understanding expressed.  

## 2021-09-13 NOTE — Telephone Encounter (Signed)
Patient returning call for echo results. 

## 2021-09-30 ENCOUNTER — Other Ambulatory Visit: Payer: Self-pay | Admitting: Internal Medicine

## 2021-10-04 ENCOUNTER — Ambulatory Visit: Payer: Medicare PPO | Admitting: Internal Medicine

## 2021-11-23 ENCOUNTER — Ambulatory Visit: Payer: Medicare PPO | Admitting: Cardiovascular Disease

## 2021-12-15 ENCOUNTER — Other Ambulatory Visit: Payer: Self-pay | Admitting: Internal Medicine

## 2021-12-15 DIAGNOSIS — F3289 Other specified depressive episodes: Secondary | ICD-10-CM

## 2021-12-17 NOTE — Progress Notes (Signed)
Cardiology Office Note:   Date:  12/20/2021  NAME:  Debbie Barry    MRN: [id number] DOB:  Sep 03, 1950   PCP:  Glendale Chard, MD  Cardiologist:  Evalina Field, MD  Electrophysiologist:  None   Referring MD: Glendale Chard, MD   Chief Complaint  Patient presents with   Follow-up    History of Present Illness:   [name] is a 71 y.o. female with a hx of cardiac arrest secondary to polymorphic ventricular tachycardia in the setting of severe hypomagnesemia, systolic heart failure with recovered ejection fraction, nonobstructive CAD who presents for follow-up.  Admitted to the hospital in November for chest pain and anasarca.  She was grossly volume overloaded in the setting of severe protein malnutrition.  She was undergoing diuresis and suffered polymorphic ventricular tachycardia with cardiac arrest secondary to magnesium being 0.8.  Ejection fraction was reduced.  Left heart catheterization with nonobstructive CAD.  She was discharged with a LifeVest but her ejection fraction has recovered.  This is secondary to VT in my opinion.  No indication for ICD.  She presents for routine follow-up.  Reports constant chest pressure.  Reports position can make it worse.  She is tender to palpation over the left chest wall.  Suspect this is likely related to musculoskeletal issue.  She also suffers from acid reflux but is currently on Protonix, Carafate and Pepto-Bismol.  She seems to be doing well with this.  Blood pressure is well controlled.  No signs of volume overload.  On torsemide 20 mg twice daily.  Problem List PMVT/Torsades/Cardiac Arrest -2/2 electrolyte derangements (Mg 0.8) 05/07/2021 2. Systolic HF with recovery  -EF 25-30% 05/2021 -EF 55-60% 08/2021 -2/2 critical illness -LHC 20% LAD 3. Non-obstructive CAD -20% LAD 05/2021 4. Severe protein deficiency  5.  Fatty liver 6.  Anasarca 7.  Severe spinal cord stenosis 8.  Acute right renal infarct 9. DVT  Past  Medical History: Past Medical History:  Diagnosis Date   Fatty liver    GERD (gastroesophageal reflux disease)    Hypertension    Osteomyelitis ([place])    right third toe   Peripheral vascular disease ([place])    Ulcer 08/14/20    Past Surgical History: Past Surgical History:  Procedure Laterality Date   AMPUTATION Right 08/08/2017   Procedure: AMPUTATION RIGHT 3RD TOE;  Surgeon: Newt Minion, MD;  Location: [place];  Service: Orthopedics;  Laterality: Right;   BIOPSY  08/14/2020   Procedure: BIOPSY;  Surgeon: Clarene Essex, MD;  [facility];  Service: Endoscopy;;   Bunionectomy Right 2006     Right   Bunionectony Left 2006      August   COLONOSCOPY N/A 01/21/2014   Procedure: COLONOSCOPY;  Surgeon: Beryle Beams, MD;  [facility];  Service: Endoscopy;  Laterality: N/A;   ESOPHAGOGASTRODUODENOSCOPY (EGD) WITH PROPOFOL N/A 08/14/2020   Procedure: ESOPHAGOGASTRODUODENOSCOPY (EGD) WITH PROPOFOL;  Surgeon: Clarene Essex, MD;  [facility];  Service: Endoscopy;  Laterality: N/A;   IR THORACENTESIS ASP PLEURAL SPACE W/IMG GUIDE  05/03/2021   RIGHT/LEFT HEART CATH AND CORONARY ANGIOGRAPHY N/A 05/11/2021   Procedure: RIGHT/LEFT HEART CATH AND CORONARY ANGIOGRAPHY;  Surgeon: Troy Sine, MD;  Location: [place] [facility];  Service: Cardiovascular;  Laterality: N/A;   TUBAL LIGATION     WISDOM TOOTH EXTRACTION      Current Medications: Current Meds  Medication Sig   acetaminophen (TYLENOL) 325 MG tablet Take 650 mg by mouth every 6 (  six) hours as needed for moderate pain.   apixaban (ELIQUIS) 5 MG TABS tablet Take 1 tablet (5 mg total) by mouth 2 (two) times daily.   bismuth subsalicylate (PEPTO BISMOL) 262 MG/15ML suspension Take 30 mLs by mouth every 6 (six) hours as needed for indigestion.   calcium carbonate (TUMS EX) 750 MG chewable tablet Chew 1 tablet by mouth daily as needed for heartburn.   Cholecalciferol (VITAMIN D3) 5000 units CAPS Take 5,000 Units by  mouth daily.   Magnesium 500 MG TABS Take 1 tablet by mouth daily.   Multiple Vitamin (MULTIVITAMIN WITH MINERALS) TABS tablet Take 1 tablet by mouth daily.   ondansetron (ZOFRAN ODT) 4 MG disintegrating tablet Take 1 tablet (4 mg total) by mouth every 8 (eight) hours as needed for nausea or vomiting.   pantoprazole (PROTONIX) 40 MG tablet Take 1 tablet (40 mg total) by mouth daily.   potassium chloride SA (KLOR-CON) 20 MEQ tablet Take 1 tablet (20 mEq total) by mouth 2 (two) times daily after a meal.   propranolol ER (INDERAL LA) 60 MG 24 hr capsule Take 1 capsule (60 mg total) by mouth daily.   sacubitril-valsartan (ENTRESTO) 24-26 MG Take 1 tablet by mouth 2 (two) times daily.   sertraline (ZOLOFT) 25 MG tablet Take 1 tablet (25 mg total) by mouth daily.   spironolactone (ALDACTONE) 25 MG tablet Take 1 tablet (25 mg total) by mouth daily.   sucralfate (CARAFATE) 1 g tablet TAKE 1 TABLET(1 GRAM) BY MOUTH TWICE DAILY   Tetrahydrozoline HCl (VISINE OP) Place 2 drops into both eyes daily as needed (for dry/irritated eyes).      Allergies:    Patient has no known allergies.   Social History: Social History   Socioeconomic History   Marital status: Married    Spouse name: Not on file   Number of children: Not on file   Years of education: Not on file   Highest education level: Not on file  Occupational History   Not on file  Tobacco Use   Smoking status: Never   Smokeless tobacco: Never  Vaping Use   Vaping Use: Never used  Substance and Sexual Activity   Alcohol use: Not Currently    Comment: social glass of wine   Drug use: No   Sexual activity: Yes    Birth control/protection: Post-menopausal  Other Topics Concern   Not on file  Social History Narrative   Not on file   Social Determinants of Health   Financial Resource Strain: Low Risk  (05/30/2021)   Overall Financial Resource Strain (CARDIA)    Difficulty of Paying Living Expenses: Not hard at all  Food Insecurity:  No Food Insecurity (07/05/2021)   Hunger Vital Sign    Worried About Running Out of Food in the Last Year: Never true    [place] in the Last Year: Never true  Transportation Needs: No Transportation Needs (07/05/2021)   PRAPARE - Hydrologist (Medical): No    Lack of Transportation (Non-Medical): No  Physical Activity: Inactive (05/30/2021)   Exercise Vital Sign    Days of Exercise per Week: 0 days    Minutes of Exercise per Session: 0 min  Stress: Stress Concern Present (05/30/2021)   [place]    Feeling of Stress : To some extent  Social Connections: Not on file     Family History: The patient's family history includes Arthritis  in her father and mother; Breast cancer in her mother; Cancer in her father and mother; Colon cancer in her mother; Heart Problems in her mother; Heart disease in her father and mother; Hypertension in her father and mother; Multiple myeloma in her father and mother.  ROS:   All other ROS reviewed and negative. Pertinent positives noted in the HPI.     EKGs/Labs/Other Studies Reviewed:   The following studies were personally reviewed by me today:  EKG:  EKG is ordered today.  The ekg ordered today demonstrates normal sinus rhythm heart rate 79, no acute ischemic changes or evidence of infarction, and was personally reviewed by me.   TTE 09/11/2021  1. Compared with the echo 82/5003, systolic function has improved.   2. Left ventricular ejection fraction, by estimation, is 55 to 60%. The  left ventricle has normal function. The left ventricle has no regional  wall motion abnormalities. Left ventricular diastolic parameters are  consistent with Grade II diastolic  dysfunction (pseudonormalization). The average left ventricular global  longitudinal strain is -22.2 %. The global longitudinal strain is normal.   3. Right ventricular systolic function is normal.  The right ventricular  size is normal. There is mildly elevated pulmonary artery systolic  pressure.   4. Left atrial size was moderately dilated.   5. The mitral valve is normal in structure. Mild mitral valve  regurgitation. No evidence of mitral stenosis.   6. The aortic valve is tricuspid. There is mild calcification of the  aortic valve. There is mild thickening of the aortic valve. Aortic valve  regurgitation is not visualized. No aortic stenosis is present.   7. The inferior vena cava is normal in size with <50% respiratory  variability, suggesting right atrial pressure of 8 mmHg.   LHC 05/11/2021   Prox LAD lesion is 20% stenosed.   Ost LAD to Prox LAD lesion is 20% stenosed.   No  obstructive CAD with minimal coronary calcification at the ostium and proximal LAD with 20% narrowing.  Otherwise normal coronary arteries.   Normal right heart pressures   Findings compatible with a nonischemic cardiomyopathy.   Significant calcification in the femoral arteries right greater than left with significant luminal calcification in the right femoral artery.  Recent Labs: 05/09/2021: TSH 4.440 05/17/2021: ALT 18 05/18/2021: B Natriuretic Peptide 431.3 05/21/2021: Hemoglobin 9.4; Platelets 397 08/27/2021: Magnesium 1.8 09/05/2021: BUN 7; Creatinine, Ser 0.62; Potassium 4.8; Sodium 145   Recent Lipid Panel    Component Value Date/Time   CHOL 146 08/22/2020 1604   TRIG 123 05/14/2021 0554   HDL 53 08/22/2020 1604   CHOLHDL 2.8 08/22/2020 1604   LDLCALC 77 08/22/2020 1604    Physical Exam:   VS:  BP 140/72   Pulse 90   Ht $R'5\' 1"'be$  (1.549 m)   Wt 159 lb (72.1 kg)   SpO2 98%   BMI 30.04 kg/m    Wt Readings from Last 3 Encounters:  12/20/21 159 lb (72.1 kg)  08/27/21 146 lb 8 oz (66.5 kg)  07/06/21 137 lb (62.1 kg)    General: Well nourished, well developed, in no acute distress Head: Atraumatic, normal size  Eyes: PEERLA, EOMI  Neck: Supple, no JVD Endocrine: No  thryomegaly Cardiac: Normal S1, S2; RRR; no murmurs, rubs, or gallops Lungs: Clear to auscultation bilaterally, no wheezing, rhonchi or rales  Abd: Soft, nontender, no hepatomegaly  Ext: No edema, pulses 2+ Musculoskeletal: No deformities, BUE and BLE strength normal and equal Skin: Warm and  dry, no rashes   Neuro: Alert and oriented to person, place, time, and situation, CNII-XII grossly intact, no focal deficits  Psych: Normal mood and affect   ASSESSMENT:   Remmie Bembenek is a 71 y.o. female who presents for the following: 1. H/O cardiac arrest   2. Chronic systolic heart failure ([place])   3. Coronary artery disease involving native coronary artery of native heart without angina pectoris     PLAN:   1. H/O cardiac arrest -Polymorphic ventricular tachycardia arrest (torsades) in the setting of hypomagnesemia due to diuresis due to volume overload in the setting of anasarca and hypoalbuminemia.  Her ejection fraction was reduced but this was likely secondary to critical illness.  It has recovered.  No indication for ICD.  Would recommend she maintain adequate electrolyte balance.  She will see electrophysiology next month.  Suspect they will release her.  2. Chronic systolic heart failure ([place]) -Secondary to critical illness.  No significant CAD.  She is on propranolol which is not ideal but ejection fraction has recovered.  I see no need to change medication at this time.  On Entresto 24 to 26 mg twice daily.  On Aldactone 25 mg daily.  She is on torsemide 20 mg twice daily and she will continue this.  She will continue with potassium supplementation as well.  Most recent electrolytes are within limits.  No indication for ICD his ejection fraction has recovered.  This is a nonischemic cardiomyopathy secondary to critical illness likely in setting of polymorphic ventricular tachycardia arrest due to electrolyte derangement.  3. Coronary artery disease involving native coronary artery of  native heart without angina pectoris -Nonobstructive disease.  No strong indication to treat this.  No need for aspirin.  On Eliquis due to history of embolic renal infarct.  She reports chest pain with symptoms are not consistent with cardiac disease.  EKG is unchanged.  No acute ischemic changes.  Left heart catheterization with nonobstructive disease.  Suspect this is musculoskeletal.  She can take Tylenol and ibuprofen as needed.      Disposition: Return in about 6 months (around 06/21/2022).  Medication Adjustments/Labs and Tests Ordered: Current medicines are reviewed at length with the patient today.  Concerns regarding medicines are outlined above.  Orders Placed This Encounter  Procedures   EKG 12-Lead   EKG 12-Lead   No orders of the defined types were placed in this encounter.   Patient Instructions  Medication Instructions:  The current medical regimen is effective;  continue present plan and medications.  *If you need a refill on your cardiac medications before your next appointment, please call your pharmacy*   Follow-Up: At [facility], you and your health needs are our priority.  As part of our continuing mission to provide you with exceptional heart care, we have created designated Provider Care Teams.  These Care Teams include your primary Cardiologist (physician) and Advanced Practice Providers (APPs -  Physician Assistants and Nurse Practitioners) who all work together to provide you with the care you need, when you need it.  We recommend signing up for the patient portal called "MyChart".  Sign up information is provided on this After Visit Summary.  MyChart is used to connect with patients for Virtual Visits (Telemedicine).  Patients are able to view lab/test results, encounter notes, upcoming appointments, etc.  Non-urgent messages can be sent to your provider as well.   To learn more about what you can do with MyChart, go to [email or web address].  Your  next appointment:   6 month(s)  The format for your next appointment:   In Person  Provider:   Evalina Field, MD {           Time Spent with Patient: I have spent a total of 25 minutes with patient reviewing hospital notes, telemetry, EKGs, labs and examining the patient as well as establishing an assessment and plan that was discussed with the patient.  > 50% of time was spent in direct patient care.  Signed, Addison Naegeli. Audie Box, MD, [place]  [address] [place], [place] [address] [phone]  12/20/2021 8:59 AM

## 2021-12-20 ENCOUNTER — Encounter: Payer: Self-pay | Admitting: Cardiovascular Disease

## 2021-12-20 ENCOUNTER — Ambulatory Visit: Payer: Medicare PPO | Admitting: Cardiovascular Disease

## 2021-12-20 VITALS — BP 140/72 | HR 90 | Ht 61.0 in | Wt 159.0 lb

## 2021-12-20 DIAGNOSIS — I5022 Chronic systolic (congestive) heart failure: Secondary | ICD-10-CM | POA: Diagnosis not present

## 2021-12-20 DIAGNOSIS — I2510 Atherosclerotic heart disease of native coronary artery without angina pectoris: Secondary | ICD-10-CM

## 2021-12-20 DIAGNOSIS — Z8674 Personal history of sudden cardiac arrest: Secondary | ICD-10-CM | POA: Diagnosis not present

## 2021-12-20 NOTE — Patient Instructions (Signed)
Medication Instructions:  ?The current medical regimen is effective;  continue present plan and medications. ? ?*If you need a refill on your cardiac medications before your next appointment, please call your pharmacy* ? ? ?Follow-Up: ?At [facility], you and your health needs are our priority.  As part of our continuing mission to provide you with exceptional heart care, we have created designated Provider Care Teams.  These Care Teams include your primary Cardiologist (physician) and Advanced Practice Providers (APPs -  Physician Assistants and Nurse Practitioners) who all work together to provide you with the care you need, when you need it. ? ?We recommend signing up for the patient portal called "MyChart".  Sign up information is provided on this After Visit Summary.  MyChart is used to connect with patients for Virtual Visits (Telemedicine).  Patients are able to view lab/test results, encounter notes, upcoming appointments, etc.  Non-urgent messages can be sent to your provider as well.   ?To learn more about what you can do with MyChart, go to [email or web address].   ? ?Your next appointment:   ?6 month(s) ? ?The format for your next appointment:   ?In Person ? ?Provider:   ? T O'Neal, MD { ? ? ? ? ? ? ? ?

## 2022-01-04 ENCOUNTER — Encounter: Payer: Self-pay | Admitting: Internal Medicine

## 2022-01-04 ENCOUNTER — Ambulatory Visit: Payer: Medicare PPO | Admitting: Internal Medicine

## 2022-01-04 VITALS — BP 138/98 | HR 96 | Ht 61.0 in | Wt 163.0 lb

## 2022-01-04 DIAGNOSIS — I469 Cardiac arrest, cause unspecified: Secondary | ICD-10-CM | POA: Diagnosis not present

## 2022-01-04 NOTE — Progress Notes (Signed)
HPI Debbie Barry returns today for followup. She is a pleasant 71 yo woman with a h/o  HTN and chronic protein malnutrition who was admitted to the hospital almost 8 months ago with a multitude of problems including pronounced metabolic abnormalities who experienced a PMVT arrest in the setting of marked QT prolongation due to a magnesium level of 0.8 and a potassium level of 2.6. The patient has improved. Her diarrhea is better. She has not had any more vomiting. She denies chest pain, sob, or syncope and her Life Vest was removed and she wore a zio monitor and had no arrhythmias. No Known Allergies   Current Outpatient Medications  Medication Sig Dispense Refill   acetaminophen (TYLENOL) 325 MG tablet Take 650 mg by mouth every 6 (six) hours as needed for moderate pain.     apixaban (ELIQUIS) 5 MG TABS tablet Take 1 tablet (5 mg total) by mouth 2 (two) times daily. 60 tablet 2   bismuth subsalicylate (PEPTO BISMOL) 262 MG/15ML suspension Take 30 mLs by mouth every 6 (six) hours as needed for indigestion.     calcium carbonate (TUMS EX) 750 MG chewable tablet Chew 1 tablet by mouth daily as needed for heartburn.     Cholecalciferol (VITAMIN D3) 5000 units CAPS Take 5,000 Units by mouth daily.     Magnesium 500 MG TABS Take 1 tablet by mouth daily.     Multiple Vitamin (MULTIVITAMIN WITH MINERALS) TABS tablet Take 1 tablet by mouth daily.     ondansetron (ZOFRAN ODT) 4 MG disintegrating tablet Take 1 tablet (4 mg total) by mouth every 8 (eight) hours as needed for nausea or vomiting. 20 tablet 0   pantoprazole (PROTONIX) 40 MG tablet Take 1 tablet (40 mg total) by mouth daily. 30 tablet 0   potassium chloride SA (KLOR-CON) 20 MEQ tablet Take 1 tablet (20 mEq total) by mouth 2 (two) times daily after a meal. 60 tablet 2   sacubitril-valsartan (ENTRESTO) 24-26 MG Take 1 tablet by mouth 2 (two) times daily. 180 tablet 3   sertraline (ZOLOFT) 25 MG tablet TAKE 1 TABLET(25 MG) BY MOUTH DAILY 90  tablet 1   sucralfate (CARAFATE) 1 g tablet TAKE 1 TABLET(1 GRAM) BY MOUTH TWICE DAILY 60 tablet 2   Tetrahydrozoline HCl (VISINE OP) Place 2 drops into both eyes daily as needed (for dry/irritated eyes).      torsemide (DEMADEX) 20 MG tablet Take 1 tablet (20 mg total) by mouth 2 (two) times daily. 180 tablet 3   No current facility-administered medications for this visit.     Past Medical History:  Diagnosis Date   Fatty liver    GERD (gastroesophageal reflux disease)    Hypertension    Osteomyelitis ([place])    right third toe   Peripheral vascular disease ([place])    Ulcer 08/14/20    ROS:   All systems reviewed and negative except as noted in the HPI.   Past Surgical History:  Procedure Laterality Date   AMPUTATION Right 08/08/2017   Procedure: AMPUTATION RIGHT 3RD TOE;  Surgeon: Newt Minion, MD;  Location: [place];  Service: Orthopedics;  Laterality: Right;   BIOPSY  08/14/2020   Procedure: BIOPSY;  Surgeon: Clarene Essex, MD;  [facility];  Service: Endoscopy;;   Bunionectomy Right 2006     Right   Bunionectony Left 2006      August   COLONOSCOPY N/A 01/21/2014   Procedure: COLONOSCOPY;  Surgeon: Beryle Beams,  MD;  [facility];  Service: Endoscopy;  Laterality: N/A;   ESOPHAGOGASTRODUODENOSCOPY (EGD) WITH PROPOFOL N/A 08/14/2020   Procedure: ESOPHAGOGASTRODUODENOSCOPY (EGD) WITH PROPOFOL;  Surgeon: Clarene Essex, MD;  [facility];  Service: Endoscopy;  Laterality: N/A;   IR THORACENTESIS ASP PLEURAL SPACE W/IMG GUIDE  05/03/2021   RIGHT/LEFT HEART CATH AND CORONARY ANGIOGRAPHY N/A 05/11/2021   Procedure: RIGHT/LEFT HEART CATH AND CORONARY ANGIOGRAPHY;  Surgeon: Troy Sine, MD;  Location: [place] [facility];  Service: Cardiovascular;  Laterality: N/A;   TUBAL LIGATION     WISDOM TOOTH EXTRACTION       Family History  Problem Relation Age of Onset   Breast cancer Mother    Colon cancer Mother    Heart Problems Mother    Hypertension  Mother    Multiple myeloma Mother    Arthritis Mother    Cancer Mother    Heart disease Mother    Hypertension Father    Heart disease Father    Multiple myeloma Father    Arthritis Father    Cancer Father      Social History   Socioeconomic History   Marital status: Married    Spouse name: Not on file   Number of children: Not on file   Years of education: Not on file   Highest education level: Not on file  Occupational History   Not on file  Tobacco Use   Smoking status: Never   Smokeless tobacco: Never  Vaping Use   Vaping Use: Never used  Substance and Sexual Activity   Alcohol use: Not Currently    Comment: social glass of wine   Drug use: No   Sexual activity: Yes    Birth control/protection: Post-menopausal  Other Topics Concern   Not on file  Social History Narrative   Not on file   Social Determinants of Health   Financial Resource Strain: Low Risk  (05/30/2021)   Overall Financial Resource Strain (CARDIA)    Difficulty of Paying Living Expenses: Not hard at all  Food Insecurity: No Food Insecurity (07/05/2021)   Hunger Vital Sign    Worried About Running Out of Food in the Last Year: Never true    Ran Out of Food in the Last Year: Never true  Transportation Needs: No Transportation Needs (07/05/2021)   PRAPARE - Hydrologist (Medical): No    Lack of Transportation (Non-Medical): No  Physical Activity: Inactive (05/30/2021)   Exercise Vital Sign    Days of Exercise per Week: 0 days    Minutes of Exercise per Session: 0 min  Stress: Stress Concern Present (05/30/2021)   [place]    Feeling of Stress : To some extent  Social Connections: Not on file  Intimate Partner Violence: Not on file     BP (!) 138/98   Pulse 96   Ht _0  (1.549 m)   Wt 163 lb (73.9 kg)   SpO2 96%   BMI 30.80 kg/m   Physical Exam:  Well appearing NAD HEENT:  Unremarkable Neck:  No JVD, no thyromegally Lymphatics:  No adenopathy Back:  No CVA tenderness Lungs:  Clear with no wheezes HEART:  Regular rate rhythm, no murmurs, no rubs, no clicks Abd:  soft, positive bowel sounds, no organomegally, no rebound, no guarding Ext:  2 plus pulses, no edema, no cyanosis, no clubbing Skin:  No rashes no nodules Neuro:  CN II through  XII intact, motor grossly intact  Assess/Plan:   Prolonged QT in the setting of metabolic derangements - she is intolerant of her beta blocker, but will avoid QT prolonging meds and maintain adequate electrolytes. VF arrest - she is asymptomatic. She did not have any arrhythmias when she wore her cardiac monitor. She will call for syncope.  Diarrhea/Vomiting - this is much improved.  Chronic systolic heart failure - her EF was initially low but improved. She will continue her current meds including entresto.   Carleene Overlie Solon Alban,MD

## 2022-01-04 NOTE — Patient Instructions (Addendum)
Medication Instructions:  Your physician recommends that you continue on your current medications as directed. Please refer to the Current Medication list given to you today.  *If you need a refill on your cardiac medications before your next appointment, please call your pharmacy*  Lab Work: None ordered.  If you have labs (blood work) drawn today and your tests are completely normal, you will receive your results only by: [place] (if you have MyChart) OR A paper copy in the mail If you have any lab test that is abnormal or we need to change your treatment, we will call you to review the results.  Testing/Procedures: None ordered.  Follow-Up: At [facility], you and your health needs are our priority.  As part of our continuing mission to provide you with exceptional heart care, we have created designated Provider Care Teams.  These Care Teams include your primary Cardiologist (physician) and Advanced Practice Providers (APPs -  Physician Assistants and Nurse Practitioners) who all work together to provide you with the care you need, when you need it.  We recommend signing up for the patient portal called "MyChart".  Sign up information is provided on this After Visit Summary.  MyChart is used to connect with patients for Virtual Visits (Telemedicine).  Patients are able to view lab/test results, encounter notes, upcoming appointments, etc.  Non-urgent messages can be sent to your provider as well.   To learn more about what you can do with MyChart, go to [email or web address].    Your next appointment:   AS NEEDED    Important Information About Sugar

## 2022-01-10 ENCOUNTER — Other Ambulatory Visit: Payer: Self-pay | Admitting: Internal Medicine

## 2022-01-21 ENCOUNTER — Telehealth: Payer: Self-pay

## 2022-01-21 NOTE — Telephone Encounter (Signed)
Called [facility] to check on pt referral that was placed in march. Betsy informed me that they patient has not yet been seen and she does not know the status of her scheduling. Left a vm for Cecille Rubin to give me a call back.

## 2022-01-21 NOTE — Telephone Encounter (Signed)
Unable to lvm. Scheduled pt an appt for august. Pt has not been seen since 05/2021. She declined [facility] in 08/2021.

## 2022-01-30 ENCOUNTER — Other Ambulatory Visit: Payer: Self-pay

## 2022-01-30 DIAGNOSIS — N280 Ischemia and infarction of kidney: Secondary | ICD-10-CM

## 2022-01-30 MED ORDER — APIXABAN 5 MG PO TABS: 5.0000 mg | ORAL_TABLET | Freq: Two times a day (BID) | ORAL | 5 refills | Status: DC

## 2022-01-30 NOTE — Telephone Encounter (Signed)
Prescription refill request for Eliquis received. Indication: embolic renal infarct/DVT Last office visit: 01/04/22 Lovena Le) Scr: 0.62 (09/05/21)  Age: 71 Weight: 73.9kg  Appropriate dose and refill sent to requested pharmacy.

## 2022-02-09 ENCOUNTER — Other Ambulatory Visit: Payer: Self-pay | Admitting: Internal Medicine

## 2022-02-11 ENCOUNTER — Ambulatory Visit: Payer: Medicare PPO | Admitting: Nurse Practitioner

## 2022-02-20 NOTE — Progress Notes (Signed)
This encounter was created in error - please disregard.

## 2022-03-20 ENCOUNTER — Other Ambulatory Visit: Payer: Self-pay | Admitting: Internal Medicine

## 2022-03-27 ENCOUNTER — Ambulatory Visit: Payer: Medicare PPO | Admitting: Nurse Practitioner

## 2022-04-01 ENCOUNTER — Ambulatory Visit: Payer: Self-pay

## 2022-04-01 NOTE — Patient Outreach (Signed)
  Care Coordination   04/01/2022 Name: Debbie Barry MRN: [id number] DOB: 08/15/1950   Care Coordination Outreach Attempts:  An unsuccessful telephone outreach was attempted for a scheduled appointment today.  Follow Up Plan:  Additional outreach attempts will be made to offer the patient care coordination information and services.   Encounter Outcome:  No Answer  Care Coordination Interventions Activated:  No   Care Coordination Interventions:  No, not indicated    Barb Merino, RN, BSN, CCM Care Management Coordinator [facility]  Direct Phone: [phone]

## 2022-04-09 ENCOUNTER — Ambulatory Visit: Payer: Self-pay

## 2022-04-09 NOTE — Patient Outreach (Signed)
  Care Coordination   04/09/2022 Name: Debbie Barry MRN: [id number] DOB: 02/09/1951   Care Coordination Outreach Attempts:  An unsuccessful telephone outreach was attempted for a scheduled appointment today.  Follow Up Plan:  Additional outreach attempts will be made to offer the patient care coordination information and services.   Encounter Outcome:  No Answer  Care Coordination Interventions Activated:  No   Care Coordination Interventions:  No, not indicated    Barb Merino, RN, BSN, CCM Care Management Coordinator [place] [facility] Direct Phone: [phone]

## 2022-04-16 ENCOUNTER — Telehealth: Payer: Self-pay | Admitting: *Deleted

## 2022-04-16 NOTE — Chronic Care Management (AMB) (Signed)
  Care Coordination Note  04/16/2022 Name: Debbie Barry MRN: [id number] DOB: [birth year]  Debbie Barry is a 71 y.o. year old female who is a primary care patient of Glendale Chard, MD and is actively engaged with the care management team. I reached out to Kittie Plater by phone today to assist with re-scheduling a follow up visit with the RN Case Manager  Follow up plan: Unsuccessful telephone outreach attempt made. A HIPAA compliant phone message was left for the patient providing contact information and requesting a return call.   [place]  Direct Dial: [phone]

## 2022-04-23 NOTE — Chronic Care Management (AMB) (Signed)
  Care Coordination Note  04/23/2022 Name: Debbie Barry MRN: [id number] DOB: Mar 07, 1951  [name] is a 71 y.o. year old female who is a primary care patient of Glendale Chard, MD and is actively engaged with the care management team. I reached out to Kittie Plater by phone today to assist with re-scheduling a follow up visit with the RN Case Manager  Follow up plan: Unsuccessful telephone outreach attempt made.    [place]  Direct Dial: [phone]

## 2022-05-01 NOTE — Chronic Care Management (AMB) (Signed)
  Care Coordination Note  05/01/2022 Name: Debbie Barry MRN: [id number] DOB: 04-06-1951  [name] is a 71 y.o. year old female who is a primary care patient of Glendale Chard, MD and is actively engaged with the care management team. I reached out to Kittie Plater by phone today to assist with re-scheduling a follow up visit with the RN Case Manager  Follow up plan: Unsuccessful telephone outreach attempt made. We have been unable to make contact with the patient for follow up. The care management team is available to follow up with the patient after provider conversation with the patient regarding recommendation for care management engagement and subsequent re-referral to the care management team.   [place]  Direct Dial: [phone]

## 2022-05-07 ENCOUNTER — Other Ambulatory Visit: Payer: Self-pay | Admitting: Internal Medicine

## 2022-05-09 DIAGNOSIS — I739 Peripheral vascular disease, unspecified: Secondary | ICD-10-CM | POA: Diagnosis not present

## 2022-05-09 DIAGNOSIS — T1490XA Injury, unspecified, initial encounter: Secondary | ICD-10-CM | POA: Diagnosis not present

## 2022-05-09 DIAGNOSIS — I10 Essential (primary) hypertension: Secondary | ICD-10-CM | POA: Diagnosis not present

## 2022-05-09 DIAGNOSIS — M25552 Pain in left hip: Secondary | ICD-10-CM | POA: Diagnosis not present

## 2022-05-09 DIAGNOSIS — G8918 Other acute postprocedural pain: Secondary | ICD-10-CM | POA: Diagnosis not present

## 2022-05-09 DIAGNOSIS — E669 Obesity, unspecified: Secondary | ICD-10-CM | POA: Diagnosis not present

## 2022-05-09 DIAGNOSIS — S72002A Fracture of unspecified part of neck of left femur, initial encounter for closed fracture: Secondary | ICD-10-CM | POA: Diagnosis not present

## 2022-05-09 DIAGNOSIS — Z471 Aftercare following joint replacement surgery: Secondary | ICD-10-CM | POA: Diagnosis not present

## 2022-05-09 DIAGNOSIS — I110 Hypertensive heart disease with heart failure: Secondary | ICD-10-CM | POA: Diagnosis not present

## 2022-05-09 DIAGNOSIS — S7292XA Unspecified fracture of left femur, initial encounter for closed fracture: Secondary | ICD-10-CM | POA: Diagnosis not present

## 2022-05-09 DIAGNOSIS — S72012A Unspecified intracapsular fracture of left femur, initial encounter for closed fracture: Secondary | ICD-10-CM | POA: Diagnosis not present

## 2022-05-09 DIAGNOSIS — Z6841 Body Mass Index (BMI) 40.0 and over, adult: Secondary | ICD-10-CM | POA: Diagnosis not present

## 2022-05-09 DIAGNOSIS — W19XXXA Unspecified fall, initial encounter: Secondary | ICD-10-CM | POA: Diagnosis not present

## 2022-05-09 DIAGNOSIS — M810 Age-related osteoporosis without current pathological fracture: Secondary | ICD-10-CM | POA: Diagnosis not present

## 2022-05-09 DIAGNOSIS — I5022 Chronic systolic (congestive) heart failure: Secondary | ICD-10-CM | POA: Diagnosis not present

## 2022-05-09 DIAGNOSIS — Z01818 Encounter for other preprocedural examination: Secondary | ICD-10-CM | POA: Diagnosis not present

## 2022-05-09 DIAGNOSIS — W010XXA Fall on same level from slipping, tripping and stumbling without subsequent striking against object, initial encounter: Secondary | ICD-10-CM | POA: Diagnosis not present

## 2022-05-09 DIAGNOSIS — I3139 Other pericardial effusion (noninflammatory): Secondary | ICD-10-CM | POA: Diagnosis not present

## 2022-05-09 DIAGNOSIS — Z8674 Personal history of sudden cardiac arrest: Secondary | ICD-10-CM | POA: Diagnosis not present

## 2022-05-09 DIAGNOSIS — R710 Precipitous drop in hematocrit: Secondary | ICD-10-CM | POA: Diagnosis not present

## 2022-05-09 DIAGNOSIS — I5189 Other ill-defined heart diseases: Secondary | ICD-10-CM | POA: Diagnosis not present

## 2022-05-09 DIAGNOSIS — Z96642 Presence of left artificial hip joint: Secondary | ICD-10-CM | POA: Diagnosis not present

## 2022-05-11 HISTORY — PX: TOTAL HIP ARTHROPLASTY: SHX124

## 2022-05-27 DIAGNOSIS — Z96642 Presence of left artificial hip joint: Secondary | ICD-10-CM | POA: Diagnosis not present

## 2022-05-27 DIAGNOSIS — S72002D Fracture of unspecified part of neck of left femur, subsequent encounter for closed fracture with routine healing: Secondary | ICD-10-CM | POA: Diagnosis not present

## 2022-06-13 ENCOUNTER — Ambulatory Visit (INDEPENDENT_AMBULATORY_CARE_PROVIDER_SITE_OTHER): Payer: Medicare PPO

## 2022-06-13 VITALS — Ht 61.0 in | Wt 160.0 lb

## 2022-06-13 DIAGNOSIS — Z0000 Encounter for general adult medical examination without abnormal findings: Secondary | ICD-10-CM | POA: Diagnosis not present

## 2022-06-13 NOTE — Patient Instructions (Signed)
Debbie Barry , Thank you for taking time to come for your Medicare Wellness Visit. I appreciate your ongoing commitment to your health goals. Please review the following plan we discussed and let me know if I can assist you in the future.   These are the goals we discussed:  Goals      Patient Stated     03/30/2020, no goals     Patient Stated     05/30/2021, strengthen legs     Patient Stated     06/13/2022, no goals     Weight (lb) < 200 lb (90.7 kg)     "I would like to lose weight"        This is a list of the screening recommended for you and due dates:  Health Maintenance  Topic Date Due   DTaP/Tdap/Td vaccine (1 - Tdap) Never done   COVID-19 Vaccine (3 - Pfizer risk series) 11/04/2019   Pneumonia Vaccine (2 - PCV) 03/22/2020   Flu Shot  01/29/2022   Mammogram  12/14/2022   Medicare Annual Wellness Visit  06/14/2023   Colon Cancer Screening  01/22/2024   DEXA scan (bone density measurement)  Completed   Hepatitis C Screening: USPSTF Recommendation to screen - Ages 67-79 yo.  Completed   Zoster (Shingles) Vaccine  Completed   HPV Vaccine  Aged Out    Advanced directives: Advance directive discussed with you today.   Conditions/risks identified: none  Next appointment: Follow up in one year for your annual wellness visit    Preventive Care 65 Years and Older, Female Preventive care refers to lifestyle choices and visits with your health care provider that can promote health and wellness. What does preventive care include? A yearly physical exam. This is also called an annual well check. Dental exams once or twice a year. Routine eye exams. Ask your health care provider how often you should have your eyes checked. Personal lifestyle choices, including: Daily care of your teeth and gums. Regular physical activity. Eating a healthy diet. Avoiding tobacco and drug use. Limiting alcohol use. Practicing safe sex. Taking low-dose aspirin every day. Taking vitamin  and mineral supplements as recommended by your health care provider. What happens during an annual well check? The services and screenings done by your health care provider during your annual well check will depend on your age, overall health, lifestyle risk factors, and family history of disease. Counseling  Your health care provider may ask you questions about your: Alcohol use. Tobacco use. Drug use. Emotional well-being. Home and relationship well-being. Sexual activity. Eating habits. History of falls. Memory and ability to understand (cognition). Work and work Statistician. Reproductive health. Screening  You may have the following tests or measurements: Height, weight, and BMI. Blood pressure. Lipid and cholesterol levels. These may be checked every 5 years, or more frequently if you are over 33 years old. Skin check. Lung cancer screening. You may have this screening every year starting at age 26 if you have a 30-pack-year history of smoking and currently smoke or have quit within the past 15 years. Fecal occult blood test (FOBT) of the stool. You may have this test every year starting at age 69. Flexible sigmoidoscopy or colonoscopy. You may have a sigmoidoscopy every 5 years or a colonoscopy every 10 years starting at age 78. Hepatitis C blood test. Hepatitis B blood test. Sexually transmitted disease (STD) testing. Diabetes screening. This is done by checking your blood sugar (glucose) after you have not eaten for  a while (fasting). You may have this done every 1-3 years. Bone density scan. This is done to screen for osteoporosis. You may have this done starting at age 58. Mammogram. This may be done every 1-2 years. Talk to your health care provider about how often you should have regular mammograms. Talk with your health care provider about your test results, treatment options, and if necessary, the need for more tests. Vaccines  Your health care provider may recommend  certain vaccines, such as: Influenza vaccine. This is recommended every year. Tetanus, diphtheria, and acellular pertussis (Tdap, Td) vaccine. You may need a Td booster every 10 years. Zoster vaccine. You may need this after age 21. Pneumococcal 13-valent conjugate (PCV13) vaccine. One dose is recommended after age 37. Pneumococcal polysaccharide (PPSV23) vaccine. One dose is recommended after age 42. Talk to your health care provider about which screenings and vaccines you need and how often you need them. This information is not intended to replace advice given to you by your health care provider. Make sure you discuss any questions you have with your health care provider. Document Released: 07/14/2015 Document Revised: 03/06/2016 Document Reviewed: 04/18/2015 Elsevier Interactive Patient Education  2017 [place] Prevention in the Home Falls can cause injuries. They can happen to people of all ages. There are many things you can do to make your home safe and to help prevent falls. What can I do on the outside of my home? Regularly fix the edges of walkways and driveways and fix any cracks. Remove anything that might make you trip as you walk through a door, such as a raised step or threshold. Trim any bushes or trees on the path to your home. Use bright outdoor lighting. Clear any walking paths of anything that might make someone trip, such as rocks or tools. Regularly check to see if handrails are loose or broken. Make sure that both sides of any steps have handrails. Any raised decks and porches should have guardrails on the edges. Have any leaves, snow, or ice cleared regularly. Use sand or salt on walking paths during winter. Clean up any spills in your garage right away. This includes oil or grease spills. What can I do in the bathroom? Use night lights. Install grab bars by the toilet and in the tub and shower. Do not use towel bars as grab bars. Use non-skid mats or  decals in the tub or shower. If you need to sit down in the shower, use a plastic, non-slip stool. Keep the floor dry. Clean up any water that spills on the floor as soon as it happens. Remove soap buildup in the tub or shower regularly. Attach bath mats securely with double-sided non-slip rug tape. Do not have throw rugs and other things on the floor that can make you trip. What can I do in the bedroom? Use night lights. Make sure that you have a light by your bed that is easy to reach. Do not use any sheets or blankets that are too big for your bed. They should not hang down onto the floor. Have a firm chair that has side arms. You can use this for support while you get dressed. Do not have throw rugs and other things on the floor that can make you trip. What can I do in the kitchen? Clean up any spills right away. Avoid walking on wet floors. Keep items that you use a lot in easy-to-reach places. If you need to reach something above  you, use a strong step stool that has a grab bar. Keep electrical cords out of the way. Do not use floor polish or wax that makes floors slippery. If you must use wax, use non-skid floor wax. Do not have throw rugs and other things on the floor that can make you trip. What can I do with my stairs? Do not leave any items on the stairs. Make sure that there are handrails on both sides of the stairs and use them. Fix handrails that are broken or loose. Make sure that handrails are as long as the stairways. Check any carpeting to make sure that it is firmly attached to the stairs. Fix any carpet that is loose or worn. Avoid having throw rugs at the top or bottom of the stairs. If you do have throw rugs, attach them to the floor with carpet tape. Make sure that you have a light switch at the top of the stairs and the bottom of the stairs. If you do not have them, ask someone to add them for you. What else can I do to help prevent falls? Wear shoes that: Do not  have high heels. Have rubber bottoms. Are comfortable and fit you well. Are closed at the toe. Do not wear sandals. If you use a stepladder: Make sure that it is fully opened. Do not climb a closed stepladder. Make sure that both sides of the stepladder are locked into place. Ask someone to hold it for you, if possible. Clearly mark and make sure that you can see: Any grab bars or handrails. First and last steps. Where the edge of each step is. Use tools that help you move around (mobility aids) if they are needed. These include: Canes. Walkers. Scooters. Crutches. Turn on the lights when you go into a dark area. Replace any light bulbs as soon as they burn out. Set up your furniture so you have a clear path. Avoid moving your furniture around. If any of your floors are uneven, fix them. If there are any pets around you, be aware of where they are. Review your medicines with your doctor. Some medicines can make you feel dizzy. This can increase your chance of falling. Ask your doctor what other things that you can do to help prevent falls. This information is not intended to replace advice given to you by your health care provider. Make sure you discuss any questions you have with your health care provider. Document Released: 04/13/2009 Document Revised: 11/23/2015 Document Reviewed: 07/22/2014 Elsevier Interactive Patient Education  2017 [employer].

## 2022-06-13 NOTE — Progress Notes (Signed)
I connected with Debbie Barry today by telephone and verified that I am speaking with the correct person using two identifiers. Location patient: home Location provider: work Persons participating in the virtual visit: Sola Margolis, Glenna Durand LPN.   I discussed the limitations, risks, security and privacy concerns of performing an evaluation and management service by telephone and the availability of in person appointments. I also discussed with the patient that there may be a patient responsible charge related to this service. The patient expressed understanding and verbally consented to this telephonic visit.    Interactive audio and video telecommunications were attempted between this provider and patient, however failed, due to patient having technical difficulties OR patient did not have access to video capability.  We continued and completed visit with audio only.     Vital signs may be patient reported or missing.  Subjective:   Debbie Barry is a 71 y.o. female who presents for Medicare Annual (Subsequent) preventive examination.  Review of Systems     Cardiac Risk Factors include: advanced age (>41mn, >>1women);hypertension;obesity (BMI >30kg/m2)     Objective:    Today's Vitals   06/13/22 1132  Weight: 160 lb (72.6 kg)  Height: _0  (1.549 m)   Body mass index is 30.23 kg/m.     06/13/2022   11:39 AM 05/30/2021   10:52 AM 05/10/2021    6:00 PM 05/02/2021    6:58 PM 04/30/2021    4:13 PM 04/19/2021    9:33 PM 03/31/2021   11:15 PM  Advanced Directives  Does Patient Have a Medical Advance Directive? No No  No No No No  Would patient like information on creating a medical advance directive?   No - Patient declined  No - Patient declined No - Patient declined No - Patient declined    Current Medications (verified) Outpatient Encounter Medications as of 06/13/2022  Medication Sig   acetaminophen (TYLENOL) 325 MG tablet Take 650 mg by mouth every 6  (six) hours as needed for moderate pain.   apixaban (ELIQUIS) 5 MG TABS tablet Take 1 tablet (5 mg total) by mouth 2 (two) times daily.   bismuth subsalicylate (PEPTO BISMOL) 262 MG/15ML suspension Take 30 mLs by mouth every 6 (six) hours as needed for indigestion.   calcium carbonate (TUMS EX) 750 MG chewable tablet Chew 1 tablet by mouth daily as needed for heartburn.   Cholecalciferol (VITAMIN D3) 5000 units CAPS Take 5,000 Units by mouth daily.   Magnesium 500 MG TABS Take 1 tablet by mouth daily.   Multiple Vitamin (MULTIVITAMIN WITH MINERALS) TABS tablet Take 1 tablet by mouth daily.   ondansetron (ZOFRAN ODT) 4 MG disintegrating tablet Take 1 tablet (4 mg total) by mouth every 8 (eight) hours as needed for nausea or vomiting.   pantoprazole (PROTONIX) 40 MG tablet Take 1 tablet (40 mg total) by mouth daily.   sacubitril-valsartan (ENTRESTO) 24-26 MG Take 1 tablet by mouth 2 (two) times daily.   sertraline (ZOLOFT) 25 MG tablet TAKE 1 TABLET(25 MG) BY MOUTH DAILY   sucralfate (CARAFATE) 1 g tablet TAKE 1 TABLET(1 GRAM) BY MOUTH TWICE DAILY   Tetrahydrozoline HCl (VISINE OP) Place 2 drops into both eyes daily as needed (for dry/irritated eyes).    potassium chloride SA (KLOR-CON) 20 MEQ tablet Take 1 tablet (20 mEq total) by mouth 2 (two) times daily after a meal. (Patient not taking: Reported on 06/13/2022)   torsemide (DEMADEX) 20 MG tablet Take 1 tablet (20 mg total)  by mouth 2 (two) times daily.   No facility-administered encounter medications on file as of 06/13/2022.    Allergies (verified) Patient has no known allergies.   History: Past Medical History:  Diagnosis Date   Fatty liver    GERD (gastroesophageal reflux disease)    Hypertension    Nausea and vomiting 03/31/2021   Osteomyelitis ([place])    right third toe   Peripheral vascular disease ([place])    Ulcer 08/14/20   Past Surgical History:  Procedure Laterality Date   AMPUTATION Right 08/08/2017   Procedure:  AMPUTATION RIGHT 3RD TOE;  Surgeon: Newt Minion, MD;  Location: [place];  Service: Orthopedics;  Laterality: Right;   BIOPSY  08/14/2020   Procedure: BIOPSY;  Surgeon: Clarene Essex, MD;  [facility];  Service: Endoscopy;;   Bunionectomy Right 2006     Right   Bunionectony Left 2006      August   COLONOSCOPY N/A 01/21/2014   Procedure: COLONOSCOPY;  Surgeon: Beryle Beams, MD;  [facility];  Service: Endoscopy;  Laterality: N/A;   ESOPHAGOGASTRODUODENOSCOPY (EGD) WITH PROPOFOL N/A 08/14/2020   Procedure: ESOPHAGOGASTRODUODENOSCOPY (EGD) WITH PROPOFOL;  Surgeon: Clarene Essex, MD;  [facility];  Service: Endoscopy;  Laterality: N/A;   IR THORACENTESIS ASP PLEURAL SPACE W/IMG GUIDE  05/03/2021   RIGHT/LEFT HEART CATH AND CORONARY ANGIOGRAPHY N/A 05/11/2021   Procedure: RIGHT/LEFT HEART CATH AND CORONARY ANGIOGRAPHY;  Surgeon: Troy Sine, MD;  Location: [facility];  Service: Cardiovascular;  Laterality: N/A;   TOTAL HIP ARTHROPLASTY Left 05/11/2022   TUBAL LIGATION     WISDOM TOOTH EXTRACTION     Family History  Problem Relation Age of Onset   Breast cancer Mother    Colon cancer Mother    Heart Problems Mother    Hypertension Mother    Multiple myeloma Mother    Arthritis Mother    Cancer Mother    Heart disease Mother    Hypertension Father    Heart disease Father    Multiple myeloma Father    Arthritis Father    Cancer Father    Social History   Socioeconomic History   Marital status: Married    Spouse name: Not on file   Number of children: Not on file   Years of education: Not on file   Highest education level: Not on file  Occupational History   Not on file  Tobacco Use   Smoking status: Never   Smokeless tobacco: Never  Vaping Use   Vaping Use: Never used  Substance and Sexual Activity   Alcohol use: Not Currently    Comment: social glass of wine   Drug use: No   Sexual activity: Yes    Birth control/protection:  Post-menopausal  Other Topics Concern   Not on file  Social History Narrative   Not on file   Social Determinants of Health   Financial Resource Strain: Low Risk  (06/13/2022)   Overall Financial Resource Strain (CARDIA)    Difficulty of Paying Living Expenses: Not hard at all  Food Insecurity: No Food Insecurity (06/13/2022)   Hunger Vital Sign    Worried About Running Out of Food in the Last Year: Never true    Ran Out of Food in the Last Year: Never true  Transportation Needs: No Transportation Needs (06/13/2022)   PRAPARE - Hydrologist (Medical): No    Lack of Transportation (Non-Medical): No  Physical Activity: Inactive (06/13/2022)  Exercise Vital Sign    Days of Exercise per Week: 0 days    Minutes of Exercise per Session: 0 min  Stress: No Stress Concern Present (06/13/2022)   [place]    Feeling of Stress : Only a little  Social Connections: Not on file    Tobacco Counseling Counseling given: Not Answered   Clinical Intake:  Pre-visit preparation completed: Yes  Pain : No/denies pain     Nutritional Status: BMI > 30  Obese Nutritional Risks: None Diabetes: No  How often do you need to have someone help you when you read instructions, pamphlets, or other written materials from your doctor or pharmacy?: 1 - Never  Diabetic? no  Interpreter Needed?: No  Information entered by :: NAllen LPN   Activities of Daily Living    06/13/2022   11:41 AM  In your present state of health, do you have any difficulty performing the following activities:  Hearing? 0  Vision? 0  Difficulty concentrating or making decisions? 0  Walking or climbing stairs? 1  Dressing or bathing? 0  Doing errands, shopping? 0  Preparing Food and eating ? N  Using the Toilet? N  In the past six months, have you accidently leaked urine? Y  Do you have problems with loss of bowel  control? N  Managing your Medications? N  Managing your Finances? N  Housekeeping or managing your Housekeeping? N    Patient Care Team: Glendale Chard, MD as PCP - General (Internal Medicine) O'Neal, Cassie Freer, MD as PCP - Cardiology (Cardiology) Magrinat, Virgie Dad, MD (Inactive) as Consulting Physician (Oncology) Clarene Essex, MD as Consulting Physician (Gastroenterology) Rex Kras, Claudette Stapler, RN as Case Manager  Indicate any recent Medical Services you may have received from other than [facility] providers in the past year (date may be approximate).     Assessment:   This is a routine wellness examination for [place].  Hearing/Vision screen Vision Screening - Comments:: Regular eye exams, [facility]  Dietary issues and exercise activities discussed: Current Exercise Habits: The patient does not participate in regular exercise at present   Goals Addressed             This Visit's Progress    Patient Stated       06/13/2022, no goals       Depression Screen    06/13/2022   11:41 AM 05/30/2021   10:53 AM 04/19/2021    3:44 PM 03/30/2020   10:16 AM 03/23/2019    2:34 PM 11/18/2018    2:59 PM  PHQ 2/9 Scores  PHQ - 2 Score 0 0 3 0 0 0  PHQ- 9 Score   6       Fall Risk    06/13/2022   11:40 AM 05/30/2021   10:52 AM 03/30/2020   10:13 AM 03/23/2019    2:34 PM 11/18/2018    2:59 PM  [place] in the past year? _0 0 0  Comment missed the curb legs give out lost balance, bent over to pick something up, going to the bathroom, feel in the bath tub water was too hot,    Number falls in past yr: 0 1 1    Injury with Fall? 1 0 0    Comment broke hip      Risk for fall due to : Impaired mobility;Medication side effect Impaired balance/gait;Impaired mobility;Medication side effect;History of fall(s) Impaired balance/gait;Medication side  effect    Follow up Falls evaluation completed;Education provided;Falls prevention discussed Falls evaluation completed;Education  provided;Falls prevention discussed Falls evaluation completed;Education provided;Falls prevention discussed      FALL RISK PREVENTION PERTAINING TO THE HOME:  Any stairs in or around the home? No  If so, are there any without handrails? N/a Home free of loose throw rugs in walkways, pet beds, electrical cords, etc? Yes  Adequate lighting in your home to reduce risk of falls? Yes   ASSISTIVE DEVICES UTILIZED TO PREVENT FALLS:  Life alert? No  Use of a cane, walker or w/c? Yes  Grab bars in the bathroom? Yes  Shower chair or bench in shower? Yes  Elevated toilet seat or a handicapped toilet? Yes   TIMED UP AND GO:  Was the test performed? No .       Cognitive Function:        06/13/2022   11:41 AM 05/30/2021   10:55 AM 03/30/2020   10:18 AM 03/23/2019    2:35 PM  6CIT Screen  What Year? 0 points 0 points 0 points 0 points  What month? 0 points 0 points 0 points 0 points  What time? 0 points 0 points 0 points 0 points  Count back from 20 0 points 0 points 0 points 0 points  Months in reverse 0 points 0 points 0 points 0 points  Repeat phrase 4 points 8 points 2 points 0 points  Total Score 4 points 8 points 2 points 0 points    Immunizations Immunization History  Administered Date(s) Administered   Influenza Split 07/11/2014   Influenza, High Dose Seasonal PF 03/23/2019   Influenza, Quadrivalent, Recombinant, Inj, Pf 03/30/2018   Influenza,inj,Quad PF,6+ Mos 03/23/2017   Influenza-Unspecified 03/30/2018   PFIZER(Purple Top)SARS-COV-2 Vaccination 09/16/2019, 10/07/2019   Pneumococcal Polysaccharide-23 03/23/2019   Zoster Recombinat (Shingrix) 05/28/2019, 08/03/2019    TDAP status: Due, Education has been provided regarding the importance of this vaccine. Advised may receive this vaccine at local pharmacy or Health Dept. Aware to provide a copy of the vaccination record if obtained from local pharmacy or Health Dept. Verbalized acceptance and understanding.  Flu  Vaccine status: Up to date  Pneumococcal vaccine status: Due, Education has been provided regarding the importance of this vaccine. Advised may receive this vaccine at local pharmacy or Health Dept. Aware to provide a copy of the vaccination record if obtained from local pharmacy or Health Dept. Verbalized acceptance and understanding.  Covid-19 vaccine status: Completed vaccines  Qualifies for Shingles Vaccine? Yes   Zostavax completed Yes   Shingrix Completed?: Yes  Screening Tests Health Maintenance  Topic Date Due   DTaP/Tdap/Td (1 - Tdap) Never done   COVID-19 Vaccine (3 - Pfizer risk series) 11/04/2019   Pneumonia Vaccine 15+ Years old (2 - PCV) 03/22/2020   INFLUENZA VACCINE  01/29/2022   Medicare Annual Wellness (AWV)  05/30/2022   MAMMOGRAM  12/14/2022   COLONOSCOPY (Pts 45-38yr Insurance coverage will need to be confirmed)  01/22/2024   DEXA SCAN  Completed   Hepatitis C Screening  Completed   Zoster Vaccines- Shingrix  Completed   HPV VACCINES  Aged Out    Health Maintenance  Health Maintenance Due  Topic Date Due   DTaP/Tdap/Td (1 - Tdap) Never done   COVID-19 Vaccine (3 - Pfizer risk series) 11/04/2019   Pneumonia Vaccine 69 Years old (2 - PCV) 03/22/2020   INFLUENZA VACCINE  01/29/2022   Medicare Annual Wellness (AWV)  05/30/2022  Colorectal cancer screening: Type of screening: Colonoscopy. Completed 01/21/2014. Repeat every 10 years  Mammogram status: patient to schedule  Bone Density status: Completed 02/07/2020.   Lung Cancer Screening: (Low Dose CT Chest recommended if Age 48-80 years, 30 pack-year currently smoking OR have quit w/in 15years.) does not qualify.   Lung Cancer Screening Referral: no  Additional Screening:  Hepatitis C Screening: does qualify; Completed 12/30/2012  Vision Screening: Recommended annual ophthalmology exams for early detection of glaucoma and other disorders of the eye. Is the patient up to date with their annual eye  exam?  Yes  Who is the provider or what is the name of the office in which the patient attends annual eye exams? [facility] If pt is not established with a provider, would they like to be referred to a provider to establish care? No .   Dental Screening: Recommended annual dental exams for proper oral hygiene  Community Resource Referral / Chronic Care Management: CRR required this visit?  No   CCM required this visit?  No      Plan:     I have personally reviewed and noted the following in the patient's chart:   Medical and social history Use of alcohol, tobacco or illicit drugs  Current medications and supplements including opioid prescriptions. Patient is not currently taking opioid prescriptions. Functional ability and status Nutritional status Physical activity Advanced directives List of other physicians Hospitalizations, surgeries, and ER visits in previous 12 months Vitals Screenings to include cognitive, depression, and falls Referrals and appointments  In addition, I have reviewed and discussed with patient certain preventive protocols, quality metrics, and best practice recommendations. A written personalized care plan for preventive services as well as general preventive health recommendations were provided to patient.     Kellie Simmering, LPN   [id number]/41/9914   Nurse Notes: none  Due to this being a virtual visit, the after visit summary with patients personalized plan was offered to patient via mail or my-chart. Patient would like to access on my-chart

## 2022-06-17 DIAGNOSIS — S72002D Fracture of unspecified part of neck of left femur, subsequent encounter for closed fracture with routine healing: Secondary | ICD-10-CM | POA: Diagnosis not present

## 2022-06-20 NOTE — Progress Notes (Deleted)
Cardiology Office Note:   Date:  06/20/2022  NAME:  Debbie Barry    MRN: [id number] DOB:  Aug 13, 1950   PCP:  Glendale Chard, MD  Cardiologist:  Evalina Field, MD  Electrophysiologist:  None   Referring MD: Glendale Chard, MD   No chief complaint on file. ***  History of Present Illness:   Debbie Barry is a 71 y.o. female with a hx of CHF with recovery of EF, non-obstructive CAD, PMVT, anasarca, severe protein deficiency who presents for follow-up.   Problem List PMVT/[place]/Cardiac Arrest -2/2 electrolyte derangements (Mg 0.8) 05/07/2021 2. Systolic HF with recovery  -EF 25-30% 05/2021 -EF 55-60% 08/2021 -2/2 critical illness 3. Non-obstructive CAD -20% LAD 05/2021 4. Severe protein deficiency  5.  Fatty liver 6.  Anasarca 7.  Severe spinal cord stenosis 8.  Acute right renal infarct 9. DVT  Past Medical History: Past Medical History:  Diagnosis Date   Fatty liver    GERD (gastroesophageal reflux disease)    Hypertension    Nausea and vomiting 03/31/2021   Osteomyelitis ([place])    right third toe   Peripheral vascular disease ([place])    Ulcer 08/14/20    Past Surgical History: Past Surgical History:  Procedure Laterality Date   AMPUTATION Right 08/08/2017   Procedure: AMPUTATION RIGHT 3RD TOE;  Surgeon: Newt Minion, MD;  Location: [place];  Service: Orthopedics;  Laterality: Right;   BIOPSY  08/14/2020   Procedure: BIOPSY;  Surgeon: Clarene Essex, MD;  [facility];  Service: Endoscopy;;   Bunionectomy Right 2006     Right   Bunionectony Left 2006      August   COLONOSCOPY N/A 01/21/2014   Procedure: COLONOSCOPY;  Surgeon: Beryle Beams, MD;  [facility];  Service: Endoscopy;  Laterality: N/A;   ESOPHAGOGASTRODUODENOSCOPY (EGD) WITH PROPOFOL N/A 08/14/2020   Procedure: ESOPHAGOGASTRODUODENOSCOPY (EGD) WITH PROPOFOL;  Surgeon: Clarene Essex, MD;  [facility];  Service: Endoscopy;  Laterality: N/A;   IR THORACENTESIS  ASP PLEURAL SPACE W/IMG GUIDE  05/03/2021   RIGHT/LEFT HEART CATH AND CORONARY ANGIOGRAPHY N/A 05/11/2021   Procedure: RIGHT/LEFT HEART CATH AND CORONARY ANGIOGRAPHY;  Surgeon: Troy Sine, MD;  Location: [place];  Service: Cardiovascular;  Laterality: N/A;   TOTAL HIP ARTHROPLASTY Left 05/11/2022   TUBAL LIGATION     WISDOM TOOTH EXTRACTION      Current Medications: No outpatient medications have been marked as taking for the 06/21/22 encounter (Appointment) with Geralynn Rile, MD.     Allergies:    Patient has no known allergies.   Social History: Social History   Socioeconomic History   Marital status: Married    Spouse name: Not on file   Number of children: Not on file   Years of education: Not on file   Highest education level: Not on file  Occupational History   Not on file  Tobacco Use   Smoking status: Never   Smokeless tobacco: Never  Vaping Use   Vaping Use: Never used  Substance and Sexual Activity   Alcohol use: Not Currently    Comment: social glass of wine   Drug use: No   Sexual activity: Yes    Birth control/protection: Post-menopausal  Other Topics Concern   Not on file  Social History Narrative   Not on file   Social Determinants of Health   Financial Resource Strain: Low Risk  (06/13/2022)   Overall Financial Resource Strain (CARDIA)  Difficulty of Paying Living Expenses: Not hard at all  Food Insecurity: No Food Insecurity (06/13/2022)   Hunger Vital Sign    Worried About Running Out of Food in the Last Year: Never true    Ran Out of Food in the Last Year: Never true  Transportation Needs: No Transportation Needs (06/13/2022)   PRAPARE - Hydrologist (Medical): No    Lack of Transportation (Non-Medical): No  Physical Activity: Inactive (06/13/2022)   Exercise Vital Sign    Days of Exercise per Week: 0 days    Minutes of Exercise per Session: 0 min  Stress: No Stress Concern Present  (06/13/2022)   [place]    Feeling of Stress : Only a little  Social Connections: Not on file     Family History: The patient's ***family history includes Arthritis in her father and mother; Breast cancer in her mother; Cancer in her father and mother; Colon cancer in her mother; Heart Problems in her mother; Heart disease in her father and mother; Hypertension in her father and mother; Multiple myeloma in her father and mother.  ROS:   All other ROS reviewed and negative. Pertinent positives noted in the HPI.     EKGs/Labs/Other Studies Reviewed:   The following studies were personally reviewed by me today:  EKG:  EKG is *** ordered today.  The ekg ordered today demonstrates ***, and was personally reviewed by me.   TTE 09/11/2021  1. Compared with the echo 86/7619, systolic function has improved.   2. Left ventricular ejection fraction, by estimation, is 55 to 60%. The  left ventricle has normal function. The left ventricle has no regional  wall motion abnormalities. Left ventricular diastolic parameters are  consistent with Grade II diastolic  dysfunction (pseudonormalization). The average left ventricular global  longitudinal strain is -22.2 %. The global longitudinal strain is normal.   3. Right ventricular systolic function is normal. The right ventricular  size is normal. There is mildly elevated pulmonary artery systolic  pressure.   4. Left atrial size was moderately dilated.   5. The mitral valve is normal in structure. Mild mitral valve  regurgitation. No evidence of mitral stenosis.   6. The aortic valve is tricuspid. There is mild calcification of the  aortic valve. There is mild thickening of the aortic valve. Aortic valve  regurgitation is not visualized. No aortic stenosis is present.   7. The inferior vena cava is normal in size with <50% respiratory  variability, suggesting right atrial pressure of  8 mmHg.   Recent Labs: 08/27/2021: Magnesium 1.8 09/05/2021: BUN 7; Creatinine, Ser 0.62; Potassium 4.8; Sodium 145   Recent Lipid Panel    Component Value Date/Time   CHOL 146 08/22/2020 1604   TRIG 123 05/14/2021 0554   HDL 53 08/22/2020 1604   CHOLHDL 2.8 08/22/2020 1604   LDLCALC 77 08/22/2020 1604    Physical Exam:   VS:  There were no vitals taken for this visit.   Wt Readings from Last 3 Encounters:  06/13/22 160 lb (72.6 kg)  01/04/22 163 lb (73.9 kg)  12/20/21 159 lb (72.1 kg)    General: Well nourished, well developed, in no acute distress Head: Atraumatic, normal size  Eyes: PEERLA, EOMI  Neck: Supple, no JVD Endocrine: No thryomegaly Cardiac: Normal S1, S2; RRR; no murmurs, rubs, or gallops Lungs: Clear to auscultation bilaterally, no wheezing, rhonchi or rales  Abd: Soft, nontender, no  hepatomegaly  Ext: No edema, pulses 2+ Musculoskeletal: No deformities, BUE and BLE strength normal and equal Skin: Warm and dry, no rashes   Neuro: Alert and oriented to person, place, time, and situation, CNII-XII grossly intact, no focal deficits  Psych: Normal mood and affect   ASSESSMENT:   Debbie Barry is a 71 y.o. female who presents for the following: No diagnosis found.  PLAN:   There are no diagnoses linked to this encounter.  {Are you ordering a CV Procedure (e.g. stress test, cath, DCCV, TEE, etc)?   Press F2        :[id number]}  Disposition: No follow-ups on file.  Medication Adjustments/Labs and Tests Ordered: Current medicines are reviewed at length with the patient today.  Concerns regarding medicines are outlined above.  No orders of the defined types were placed in this encounter.  No orders of the defined types were placed in this encounter.   There are no Patient Instructions on file for this visit.   Time Spent with Patient: I have spent a total of *** minutes with patient reviewing hospital notes, telemetry, EKGs, labs and examining the  patient as well as establishing an assessment and plan that was discussed with the patient.  > 50% of time was spent in direct patient care.  Signed, Addison Naegeli. Audie Box, MD, [place]  [address] [place], [place] [address] [phone]  06/20/2022 6:08 AM

## 2022-06-21 ENCOUNTER — Ambulatory Visit: Payer: Medicare PPO | Attending: Cardiovascular Disease | Admitting: Cardiovascular Disease

## 2022-06-21 DIAGNOSIS — I5022 Chronic systolic (congestive) heart failure: Secondary | ICD-10-CM

## 2022-06-21 DIAGNOSIS — I469 Cardiac arrest, cause unspecified: Secondary | ICD-10-CM

## 2022-06-21 DIAGNOSIS — I2510 Atherosclerotic heart disease of native coronary artery without angina pectoris: Secondary | ICD-10-CM

## 2022-06-21 DIAGNOSIS — I428 Other cardiomyopathies: Secondary | ICD-10-CM

## 2022-06-27 ENCOUNTER — Encounter: Payer: Self-pay | Admitting: Cardiovascular Disease

## 2022-07-25 ENCOUNTER — Ambulatory Visit (INDEPENDENT_AMBULATORY_CARE_PROVIDER_SITE_OTHER): Payer: Medicare PPO | Admitting: Internal Medicine

## 2022-07-25 ENCOUNTER — Encounter: Payer: Self-pay | Admitting: Internal Medicine

## 2022-07-25 VITALS — BP 122/78 | HR 100 | Temp 97.9°F | Ht 61.0 in | Wt 170.4 lb

## 2022-07-25 DIAGNOSIS — F5101 Primary insomnia: Secondary | ICD-10-CM | POA: Diagnosis not present

## 2022-07-25 DIAGNOSIS — I110 Hypertensive heart disease with heart failure: Secondary | ICD-10-CM

## 2022-07-25 DIAGNOSIS — I5022 Chronic systolic (congestive) heart failure: Secondary | ICD-10-CM

## 2022-07-25 DIAGNOSIS — I739 Peripheral vascular disease, unspecified: Secondary | ICD-10-CM

## 2022-07-25 DIAGNOSIS — Z6832 Body mass index (BMI) 32.0-32.9, adult: Secondary | ICD-10-CM

## 2022-07-25 DIAGNOSIS — Z79899 Other long term (current) drug therapy: Secondary | ICD-10-CM

## 2022-07-25 DIAGNOSIS — E559 Vitamin D deficiency, unspecified: Secondary | ICD-10-CM | POA: Diagnosis not present

## 2022-07-25 DIAGNOSIS — N280 Ischemia and infarction of kidney: Secondary | ICD-10-CM

## 2022-07-25 DIAGNOSIS — R635 Abnormal weight gain: Secondary | ICD-10-CM | POA: Diagnosis not present

## 2022-07-25 DIAGNOSIS — E6609 Other obesity due to excess calories: Secondary | ICD-10-CM | POA: Diagnosis not present

## 2022-07-25 DIAGNOSIS — I428 Other cardiomyopathies: Secondary | ICD-10-CM

## 2022-07-25 DIAGNOSIS — M48061 Spinal stenosis, lumbar region without neurogenic claudication: Secondary | ICD-10-CM

## 2022-07-25 NOTE — Patient Instructions (Signed)
Fatigue If you have fatigue, you feel tired all the time and have a lack of energy or a lack of motivation. Fatigue may make it difficult to start or complete tasks because of exhaustion. Occasional or mild fatigue is often a normal response to activity or life. However, long-term (chronic) or extreme fatigue may be a symptom of a medical condition such as: Depression. Not having enough red blood cells or hemoglobin in the blood (anemia). A problem with a small gland located in the lower front part of the neck (thyroid disorder). Rheumatologic conditions. These are problems related to the body's defense system (immune system). Infections, especially certain viral infections. Fatigue can also lead to negative health outcomes over time. Follow these instructions at home: Medicines Take over-the-counter and prescription medicines only as told by your health care provider. Take a multivitamin if told by your health care provider. Do not use herbal or dietary supplements unless they are approved by your health care provider. Eating and drinking  Avoid heavy meals in the evening. Eat a well-balanced diet, which includes lean proteins, whole grains, plenty of fruits and vegetables, and low-fat dairy products. Avoid eating or drinking too many products with caffeine in them. Avoid alcohol. Drink enough fluid to keep your urine pale yellow. Activity  Exercise regularly, as told by your health care provider. Use or practice techniques to help you relax, such as yoga, tai chi, meditation, or massage therapy. Lifestyle Change situations that cause you stress. Try to keep your work and personal schedules in balance. Do not use recreational or illegal drugs. General instructions Monitor your fatigue for any changes. Go to bed and get up at the same time every day. Avoid fatigue by pacing yourself during the day and getting enough sleep at night. Maintain a healthy weight. Contact a health care  provider if: Your fatigue does not get better. You have a fever. You suddenly lose or gain weight. You have headaches. You have trouble falling asleep or sleeping through the night. You feel angry, guilty, anxious, or sad. You have swelling in your legs or another part of your body. Get help right away if: You feel confused, feel like you might faint, or faint. Your vision is blurry or you have a severe headache. You have severe pain in your abdomen, your back, or the area between your waist and hips (pelvis). You have chest pain, shortness of breath, or an irregular or fast heartbeat. You are unable to urinate, or you urinate less than normal. You have abnormal bleeding from the rectum, nose, lungs, nipples, or, if you are female, the vagina. You vomit blood. You have thoughts about hurting yourself or others. These symptoms may be an emergency. Get help right away. Call 911. Do not wait to see if the symptoms will go away. Do not drive yourself to the hospital. Get help right away if you feel like you may hurt yourself or others, or have thoughts about taking your own life. Go to your nearest emergency room or: Call 911. Call the [facility] at [phone] or 988. This is open 24 hours a day. Text the Crisis Text Line at 979-823-0385. Summary If you have fatigue, you feel tired all the time and have a lack of energy or a lack of motivation. Fatigue may make it difficult to start or complete tasks because of exhaustion. Long-term (chronic) or extreme fatigue may be a symptom of a medical condition. Exercise regularly, as told by your health care provider.  Change situations that cause you stress. Try to keep your work and personal schedules in balance. This information is not intended to replace advice given to you by your health care provider. Make sure you discuss any questions you have with your health care provider. Document Revised: 04/09/2021 Document  Reviewed: 04/09/2021 Elsevier Patient Education  2023 [employer] Inc[employer]  

## 2022-07-25 NOTE — Progress Notes (Signed)
I,Victoria T Hamilton,acting as a scribe for Maximino Greenland, MD.,have documented all relevant documentation on the behalf of Maximino Greenland, MD,as directed by  Maximino Greenland, MD while in the presence of Maximino Greenland, MD.    Subjective:     Patient ID: Debbie Barry , female    DOB: 1950-11-01 , 72 y.o.   MRN: [id number]   Chief Complaint  Patient presents with   Trouble Sleeping    Fatigue    HPI  Pt presents today c/o having trouble sleeping & fatigue. She states her sx have worsened since her THR performed in 05/11/22. Unfortunately, she did not have any physical therapy after her surgery.  She states the surgeon did not approve it. She does not feel she recovered fully due to the lack of PT.  She is returning to work on 08/05/22, needs to be able to sleep better.  Denies headache, chest pain, blurred vision.      Past Medical History:  Diagnosis Date   Fatty liver    GERD (gastroesophageal reflux disease)    Hypertension    Nausea and vomiting 03/31/2021   Osteomyelitis (HCC)    right third toe   Peripheral vascular disease ([place])    Ulcer 08/14/20     Family History  Problem Relation Age of Onset   Breast cancer Mother    Colon cancer Mother    Heart Problems Mother    Hypertension Mother    Multiple myeloma Mother    Arthritis Mother    Cancer Mother    Heart disease Mother    Hypertension Father    Heart disease Father    Multiple myeloma Father    Arthritis Father    Cancer Father      Current Outpatient Medications:    acetaminophen (TYLENOL) 325 MG tablet, Take 650 mg by mouth every 6 (six) hours as needed for moderate pain., Disp: , Rfl:    Cholecalciferol (VITAMIN D3) 5000 units CAPS, Take 5,000 Units by mouth daily., Disp: , Rfl:    Magnesium 500 MG TABS, Take 1 tablet by mouth daily., Disp: , Rfl:    pantoprazole (PROTONIX) 40 MG tablet, Take 1 tablet (40 mg total) by mouth daily., Disp: 30 tablet, Rfl: 0   sacubitril-valsartan  (ENTRESTO) 24-26 MG, Take 1 tablet by mouth 2 (two) times daily., Disp: 180 tablet, Rfl: 3   sertraline (ZOLOFT) 25 MG tablet, TAKE 1 TABLET(25 MG) BY MOUTH DAILY, Disp: 90 tablet, Rfl: 1   sucralfate (CARAFATE) 1 g tablet, TAKE 1 TABLET(1 GRAM) BY MOUTH TWICE DAILY, Disp: 30 tablet, Rfl: 0   apixaban (ELIQUIS) 5 MG TABS tablet, Take 1 tablet (5 mg total) by mouth 2 (two) times daily. (Patient not taking: Reported on 07/25/2022), Disp: 60 tablet, Rfl: 5   bismuth subsalicylate (PEPTO BISMOL) 262 MG/15ML suspension, Take 30 mLs by mouth every 6 (six) hours as needed for indigestion., Disp: , Rfl:    calcium carbonate (TUMS EX) 750 MG chewable tablet, Chew 1 tablet by mouth daily as needed for heartburn. (Patient not taking: Reported on 07/25/2022), Disp: , Rfl:    Multiple Vitamin (MULTIVITAMIN WITH MINERALS) TABS tablet, Take 1 tablet by mouth daily., Disp: , Rfl:    ondansetron (ZOFRAN ODT) 4 MG disintegrating tablet, Take 1 tablet (4 mg total) by mouth every 8 (eight) hours as needed for nausea or vomiting. (Patient not taking: Reported on 07/25/2022), Disp: 20 tablet, Rfl: 0   potassium chloride SA (KLOR-CON) 20  MEQ tablet, Take 1 tablet (20 mEq total) by mouth 2 (two) times daily after a meal. (Patient not taking: Reported on 06/13/2022), Disp: 60 tablet, Rfl: 2   torsemide (DEMADEX) 20 MG tablet, Take 1 tablet (20 mg total) by mouth 2 (two) times daily., Disp: 180 tablet, Rfl: 3   No Known Allergies   Review of Systems  Constitutional:  Positive for fatigue.  Respiratory: Negative.    Cardiovascular: Negative.   Neurological: Negative.   Psychiatric/Behavioral:  Positive for sleep disturbance.      Today's Vitals   07/25/22 1109  BP: 122/78  Pulse: 100  Temp: 97.9 F (36.6 C)  SpO2: 98%  Weight: 170 lb 6.4 oz (77.3 kg)  Height: '5\' 1"'$  (1.549 m)   Body mass index is 32.2 kg/m.  Wt Readings from Last 3 Encounters:  07/25/22 170 lb 6.4 oz (77.3 kg)  06/13/22 160 lb (72.6 kg)   01/04/22 163 lb (73.9 kg)    Objective:  Physical Exam Vitals and nursing note reviewed.  Constitutional:      Appearance: Normal appearance. She is obese.  HENT:     Head: Normocephalic and atraumatic.     Nose:     Comments: Masked     Mouth/Throat:     Comments: Masked  Eyes:     Extraocular Movements: Extraocular movements intact.  Cardiovascular:     Rate and Rhythm: Normal rate and regular rhythm.     Heart sounds: Normal heart sounds.  Pulmonary:     Effort: Pulmonary effort is normal.     Breath sounds: Normal breath sounds.  Musculoskeletal:     Cervical back: Normal range of motion.     Right lower leg: Edema present.     Left lower leg: Edema present.  Skin:    General: Skin is warm.  Neurological:     General: No focal deficit present.     Mental Status: She is alert.  Psychiatric:        Mood and Affect: Mood normal.        Behavior: Behavior normal.     Assessment And Plan:     1. Primary insomnia Comments: Chronic, advised to consider melatonin nightly. May benefit from drinking tart cherry juice nightly along with continued Mg supplementation.  2. Hypertensive heart disease with chronic systolic congestive heart failure ([place]) Comments: Chronic, well controlled. Importance of dietary/med compliance was d/w patient. She will c/w Entresto, torsemide - CMP14+EGFR - CBC - Lipid panel - TSH - Brain natriuretic peptide  3. Nonischemic cardiomyopathy [facility]) Comments: Cardiology notes reviewed. She will c/w Entresto, aldactone and torsemide. Importance of dietary/medication/OV compliance was stressed to the patient. - Brain natriuretic peptide  4. Weight gain Comments: She has gained 10lbs in the past month. She admits to having a liberal diet. Importance of following low sodium diet is d/w patient. - Hemoglobin A1c - Insulin, random(561)  5. Vitamin D deficiency disease Comments: I will check a vitamin D level and supplement as needed. - Vitamin D  (25 hydroxy)  6. Renal infarct [facility]) Comments: Unfortunately, she is no longer on Eliquis. She is encouraged to resume this medication. She needed to stop Eliquis prior to hip surgery, not permanently.  7. Class 1 obesity due to excess calories without serious comorbidity with body mass index (BMI) of 32.0 to 32.9 in adult Comments: She is encouraged to aim for at least 150 minutes of exercise/week, while striving for BMI<30 to decrease cardiac risk. - Prealbumin  8.  Polypharmacy - Vitamin B12  Patient was given opportunity to ask questions. Patient verbalized understanding of the plan and was able to repeat key elements of the plan. All questions were answered to their satisfaction.   I, Maximino Greenland, MD, have reviewed all documentation for this visit. The documentation on 07/25/22 for the exam, diagnosis, procedures, and orders are all accurate and complete.   IF YOU HAVE BEEN REFERRED TO A SPECIALIST, IT MAY TAKE 1-2 WEEKS TO SCHEDULE/PROCESS THE REFERRAL. IF YOU HAVE NOT HEARD FROM US/SPECIALIST IN TWO WEEKS, PLEASE GIVE [place] A CALL AT [phone] X [phone].   THE PATIENT IS ENCOURAGED TO PRACTICE SOCIAL DISTANCING DUE TO THE COVID-19 PANDEMIC.

## 2022-07-27 LAB — CBC
Hematocrit: 41.2 % (ref 34.0–46.6)
Hemoglobin: 13.7 g/dL (ref 11.1–15.9)
MCH: 32.5 pg (ref 26.6–33.0)
MCHC: 33.3 g/dL (ref 31.5–35.7)
MCV: 98 fL — ABNORMAL HIGH (ref 79–97)
Platelets: 154 10*3/uL (ref 150–450)
RBC: 4.21 x10E6/uL (ref 3.77–5.28)
RDW: 13.7 % (ref 11.7–15.4)
WBC: 5.1 10*3/uL (ref 3.4–10.8)

## 2022-07-27 LAB — CMP14+EGFR
ALT: 21 IU/L (ref 0–32)
AST: 44 IU/L — ABNORMAL HIGH (ref 0–40)
Albumin/Globulin Ratio: 1.2 (ref 1.2–2.2)
Albumin: 3.4 g/dL — ABNORMAL LOW (ref 3.9–4.9)
Alkaline Phosphatase: 197 IU/L — ABNORMAL HIGH (ref 44–121)
BUN/Creatinine Ratio: 6 — ABNORMAL LOW (ref 12–28)
BUN: 4 mg/dL — ABNORMAL LOW (ref 8–27)
Bilirubin Total: 0.3 mg/dL (ref 0.0–1.2)
CO2: 23 mmol/L (ref 20–29)
Calcium: 9 mg/dL (ref 8.7–10.3)
Chloride: 103 mmol/L (ref 96–106)
Creatinine, Ser: 0.69 mg/dL (ref 0.57–1.00)
Globulin, Total: 2.8 g/dL (ref 1.5–4.5)
Glucose: 90 mg/dL (ref 70–99)
Potassium: 3.8 mmol/L (ref 3.5–5.2)
Sodium: 142 mmol/L (ref 134–144)
Total Protein: 6.2 g/dL (ref 6.0–8.5)
eGFR: 94 mL/min/{1.73_m2} (ref >59)

## 2022-07-27 LAB — LIPID PANEL
Chol/HDL Ratio: 2.2 ratio (ref 0.0–4.4)
Cholesterol, Total: 138 mg/dL (ref 100–199)
HDL: 62 mg/dL (ref >39)
LDL Chol Calc (NIH): 54 mg/dL (ref 0–99)
Triglycerides: 130 mg/dL (ref 0–149)
VLDL Cholesterol Cal: 22 mg/dL (ref 5–40)

## 2022-07-27 LAB — TSH: TSH: 2.7 u[IU]/mL (ref 0.450–4.500)

## 2022-07-27 LAB — BRAIN NATRIURETIC PEPTIDE: BNP: 118.3 pg/mL — ABNORMAL HIGH (ref 0.0–100.0)

## 2022-07-27 LAB — VITAMIN B12: Vitamin B-12: >2000 pg/mL — ABNORMAL HIGH (ref 232–1245)

## 2022-07-27 LAB — VITAMIN D 25 HYDROXY (VIT D DEFICIENCY, FRACTURES): Vit D, 25-Hydroxy: 57.4 ng/mL (ref 30.0–100.0)

## 2022-07-27 LAB — PREALBUMIN: PREALBUMIN: 19 mg/dL (ref 10–36)

## 2022-07-27 LAB — INSULIN, RANDOM: INSULIN: 4.7 u[IU]/mL (ref 2.6–24.9)

## 2022-07-27 LAB — HEMOGLOBIN A1C
Est. average glucose Bld gHb Est-mCnc: 91 mg/dL
Hgb A1c MFr Bld: 4.8 % (ref 4.8–5.6)

## 2022-08-03 DIAGNOSIS — R635 Abnormal weight gain: Secondary | ICD-10-CM | POA: Insufficient documentation

## 2022-08-03 DIAGNOSIS — E6609 Other obesity due to excess calories: Secondary | ICD-10-CM | POA: Insufficient documentation

## 2022-08-03 DIAGNOSIS — E559 Vitamin D deficiency, unspecified: Secondary | ICD-10-CM | POA: Insufficient documentation

## 2022-08-03 DIAGNOSIS — Z6832 Body mass index (BMI) 32.0-32.9, adult: Secondary | ICD-10-CM | POA: Insufficient documentation

## 2022-08-04 ENCOUNTER — Other Ambulatory Visit: Payer: Self-pay | Admitting: Internal Medicine

## 2022-08-05 ENCOUNTER — Other Ambulatory Visit: Payer: Self-pay

## 2022-08-05 MED ORDER — TORSEMIDE 20 MG PO TABS: 20.0000 mg | ORAL_TABLET | Freq: Two times a day (BID) | ORAL | 0 refills | Status: DC

## 2022-08-05 NOTE — Telephone Encounter (Signed)
From: Kittie Plater To: Office of Lenna Sciara, NP Sent: 08/04/2022 3:28 PM EST Subject: Medication Renewal Request  Refills have been requested for the following medications:   torsemide (DEMADEX) 20 MG tablet [Emily C Monge]  Preferred pharmacy: [facility] [phone] - [place], [place] Delivery method: [employer]

## 2022-08-14 ENCOUNTER — Telehealth: Payer: Self-pay | Admitting: Cardiovascular Disease

## 2022-08-14 NOTE — Telephone Encounter (Signed)
Patient calling the office for samples of medication:   1.  What medication and dosage are you requesting samples for? Entresto   2.  Are you currently out of this medication?   Yes, patient states her new insurance goes into affect on 3/01 and she has not met her deductible

## 2022-08-15 ENCOUNTER — Telehealth: Payer: Self-pay | Admitting: Cardiovascular Disease

## 2022-08-15 MED ORDER — ENTRESTO 24-26 MG PO TABS: 1.0000 | ORAL_TABLET | Freq: Two times a day (BID) | ORAL | 0 refills | Status: DC

## 2022-08-15 NOTE — Telephone Encounter (Signed)
Pt c/o medication issue:  1. Name of Medication:   sacubitril-valsartan (ENTRESTO) 24-26 MG    2. How are you currently taking this medication (dosage and times per day)? Take 1 tablet by mouth 2 (two) times daily.   3. Are you having a reaction (difficulty breathing--STAT)? No  4. What is your medication issue? Pt called back regarding medication samples, she said that she will come by no later than tomorrow to pick up samples. She is interested in enrolling for the grant that is available. She's requesting a call back to enroll in pt assistance.

## 2022-08-15 NOTE — Telephone Encounter (Signed)
Pt calling back to f/u on Samples for Entresto. Please advise

## 2022-08-15 NOTE — Addendum Note (Signed)
Addended by: Anda Latina on: 08/15/2022 01:49 PM   Modules accepted: Orders

## 2022-08-19 DIAGNOSIS — Z471 Aftercare following joint replacement surgery: Secondary | ICD-10-CM | POA: Diagnosis not present

## 2022-08-19 DIAGNOSIS — Z96642 Presence of left artificial hip joint: Secondary | ICD-10-CM | POA: Diagnosis not present

## 2022-08-19 DIAGNOSIS — S72002D Fracture of unspecified part of neck of left femur, subsequent encounter for closed fracture with routine healing: Secondary | ICD-10-CM | POA: Diagnosis not present

## 2022-09-20 ENCOUNTER — Other Ambulatory Visit: Payer: Self-pay | Admitting: Cardiovascular Disease

## 2022-09-20 ENCOUNTER — Other Ambulatory Visit: Payer: Self-pay | Admitting: Internal Medicine

## 2022-09-20 ENCOUNTER — Other Ambulatory Visit (HOSPITAL_BASED_OUTPATIENT_CLINIC_OR_DEPARTMENT_OTHER): Payer: Self-pay | Admitting: Nurse Practitioner

## 2022-09-23 NOTE — Progress Notes (Unsigned)
Cardiology Office Note:   Date:  09/25/2022  NAME:  Debbie Barry    MRN: [id number] DOB:  May 31, 1951   PCP:  Glendale Chard, MD  Cardiologist:  Evalina Field, MD  Electrophysiologist:  None   Referring MD: Glendale Chard, MD   Chief Complaint  Patient presents with   Follow-up        History of Present Illness:   Debbie Barry is a 72 y.o. female with a hx of CHF, severe spinal cord stenosis, PMVT who presents for follow-up.   She is more SOB with activity. Improves with stopping activity. Some LE edema today. BP elevated.  She is not on a beta-blocker or on Aldactone.  She is on Entresto.  She reports she has had to take half a tablet due to not picking up refills recently.  She denies any chest pain or chest pressure.  Symptoms seem to be just shortness of breath.  Echo last year had recovered.  No other change to medications.  She takes torsemide as needed.  No significant chest discomfort.  BP 158/92.  Cholesterol at goal on no medication.  She had minimal CAD.  She is on Eliquis due to history of DVT.  Overall doing well since her hospitalization with cardiac arrest.  This was due to torsades in the setting of severe electrolyte derangement.  No further episodes or syncope.  Problem List PMVT/Torsades/Cardiac Arrest -2/2 electrolyte derangements (Mg 0.8) 05/07/2021 2. Systolic HF with recovery  -EF 25-30% 05/2021 -EF 55-60% 08/2021 -2/2 critical illness -LHC 20% LAD 3. Non-obstructive CAD -20% LAD 05/2021 4. Severe protein deficiency  5.  Fatty liver 6.  Anasarca 7.  Severe spinal cord stenosis 8.  Acute right renal infarct 9. DVT  Past Medical History: Past Medical History:  Diagnosis Date   Fatty liver    GERD (gastroesophageal reflux disease)    Hypertension    Nausea and vomiting 03/31/2021   Osteomyelitis ([place])    right third toe   Peripheral vascular disease ([place])    Ulcer 08/14/20    Past Surgical History: Past Surgical History:  Procedure  Laterality Date   AMPUTATION Right 08/08/2017   Procedure: AMPUTATION RIGHT 3RD TOE;  Surgeon: Newt Minion, MD;  Location: [facility];  Service: Orthopedics;  Laterality: Right;   BIOPSY  08/14/2020   Procedure: BIOPSY;  Surgeon: Clarene Essex, MD;  Location: [facility];  Service: Endoscopy;;   Bunionectomy Right 2006     Right   Bunionectony Left 2006      August   COLONOSCOPY N/A 01/21/2014   Procedure: COLONOSCOPY;  Surgeon: Beryle Beams, MD;  Location: [facility];  Service: Endoscopy;  Laterality: N/A;   ESOPHAGOGASTRODUODENOSCOPY (EGD) WITH PROPOFOL N/A 08/14/2020   Procedure: ESOPHAGOGASTRODUODENOSCOPY (EGD) WITH PROPOFOL;  Surgeon: Clarene Essex, MD;  Location: [facility];  Service: Endoscopy;  Laterality: N/A;   IR THORACENTESIS ASP PLEURAL SPACE W/IMG GUIDE  05/03/2021   RIGHT/LEFT HEART CATH AND CORONARY ANGIOGRAPHY N/A 05/11/2021   Procedure: RIGHT/LEFT HEART CATH AND CORONARY ANGIOGRAPHY;  Surgeon: Troy Sine, MD;  Location: [facility];  Service: Cardiovascular;  Laterality: N/A;   TOTAL HIP ARTHROPLASTY Left 05/11/2022   TUBAL LIGATION     WISDOM TOOTH EXTRACTION      Current Medications: Current Meds  Medication Sig   acetaminophen (TYLENOL) 325 MG tablet Take 650 mg by mouth every 6 (six) hours as needed for moderate pain.   apixaban (ELIQUIS) 5 MG TABS tablet Take  1 tablet (5 mg total) by mouth 2 (two) times daily.   bismuth subsalicylate (PEPTO BISMOL) 262 MG/15ML suspension Take 30 mLs by mouth every 6 (six) hours as needed for indigestion.   calcium carbonate (TUMS EX) 750 MG chewable tablet Chew 1 tablet by mouth daily as needed for heartburn.   carvedilol (COREG) 12.5 MG tablet Take 1 tablet (12.5 mg total) by mouth 2 (two) times daily.   Cholecalciferol (VITAMIN D3) 5000 units CAPS Take 5,000 Units by mouth daily.   Magnesium 500 MG TABS Take 1 tablet by mouth daily.   Multiple Vitamin (MULTIVITAMIN WITH MINERALS) TABS tablet Take 1 tablet by mouth  daily.   ondansetron (ZOFRAN ODT) 4 MG disintegrating tablet Take 1 tablet (4 mg total) by mouth every 8 (eight) hours as needed for nausea or vomiting.   pantoprazole (PROTONIX) 40 MG tablet Take 1 tablet (40 mg total) by mouth daily.   potassium chloride SA (KLOR-CON M) 20 MEQ tablet TAKE 1 TABLET(20 MEQ) BY MOUTH TWICE DAILY   sertraline (ZOLOFT) 25 MG tablet TAKE 1 TABLET(25 MG) BY MOUTH DAILY   spironolactone (ALDACTONE) 25 MG tablet Take 0.5 tablets (12.5 mg total) by mouth daily.   sucralfate (CARAFATE) 1 g tablet TAKE 1 TABLET(1 GRAM) BY MOUTH TWICE DAILY   torsemide (DEMADEX) 20 MG tablet Take 1 tablet (20 mg total) by mouth 2 (two) times daily.   [DISCONTINUED] sacubitril-valsartan (ENTRESTO) 24-26 MG TAKE 1 TABLET BY MOUTH TWICE DAILY     Allergies:    Patient has no known allergies.   Social History: Social History   Socioeconomic History   Marital status: Married    Spouse name: Not on file   Number of children: Not on file   Years of education: Not on file   Highest education level: Not on file  Occupational History   Not on file  Tobacco Use   Smoking status: Never   Smokeless tobacco: Never  Vaping Use   Vaping Use: Never used  Substance and Sexual Activity   Alcohol use: Not Currently    Comment: social glass of wine   Drug use: No   Sexual activity: Yes    Birth control/protection: Post-menopausal  Other Topics Concern   Not on file  Social History Narrative   Not on file   Social Determinants of Health   Financial Resource Strain: Low Risk  (06/13/2022)   Overall Financial Resource Strain (CARDIA)    Difficulty of Paying Living Expenses: Not hard at all  Food Insecurity: No Food Insecurity (06/13/2022)   Hunger Vital Sign    Worried About Running Out of Food in the Last Year: Never true    [place] in the Last Year: Never true  Transportation Needs: No Transportation Needs (06/13/2022)   PRAPARE - Hydrologist  (Medical): No    Lack of Transportation (Non-Medical): No  Physical Activity: Inactive (06/13/2022)   Exercise Vital Sign    Days of Exercise per Week: 0 days    Minutes of Exercise per Session: 0 min  Stress: No Stress Concern Present (06/13/2022)   [place]    Feeling of Stress : Only a little  Social Connections: Not on file     Family History: The patient's family history includes Arthritis in her father and mother; Breast cancer in her mother; Cancer in her father and mother; Colon cancer in her mother; Heart Problems in her  mother; Heart disease in her father and mother; Hypertension in her father and mother; Multiple myeloma in her father and mother.  ROS:   All other ROS reviewed and negative. Pertinent positives noted in the HPI.     EKGs/Labs/Other Studies Reviewed:   The following studies were personally reviewed by me today:   Recent Labs: 07/25/2022: ALT 21; BNP 118.3; BUN 4; Creatinine, Ser 0.69; Hemoglobin 13.7; Platelets 154; Potassium 3.8; Sodium 142; TSH 2.700   Recent Lipid Panel    Component Value Date/Time   CHOL 138 07/25/2022 1202   TRIG 130 07/25/2022 1202   HDL 62 07/25/2022 1202   CHOLHDL 2.2 07/25/2022 1202   LDLCALC 54 07/25/2022 1202    Physical Exam:   VS:  BP (!) 158/92   Pulse 96   Ht 5\' 1"  (1.549 m)   Wt 173 lb 6.4 oz (78.7 kg)   SpO2 99%   BMI 32.76 kg/m    Wt Readings from Last 3 Encounters:  09/25/22 173 lb 6.4 oz (78.7 kg)  07/25/22 170 lb 6.4 oz (77.3 kg)  06/13/22 160 lb (72.6 kg)    General: Well nourished, well developed, in no acute distress Head: Atraumatic, normal size  Eyes: PEERLA, EOMI  Neck: Supple, no JVD Endocrine: No thryomegaly Cardiac: Normal S1, S2; RRR; no murmurs, rubs, or gallops Lungs: Clear to auscultation bilaterally, no wheezing, rhonchi or rales  Abd: Soft, nontender, no hepatomegaly  Ext: 1+ lower extremity edema Musculoskeletal: No  deformities, BUE and BLE strength normal and equal Skin: Warm and dry, no rashes   Neuro: Alert and oriented to person, place, time, and situation, CNII-XII grossly intact, no focal deficits  Psych: Normal mood and affect   ASSESSMENT:   Taisha Knuckles is a 72 y.o. female who presents for the following: 1. H/O cardiac arrest   2. SOB (shortness of breath)   3. Chronic systolic heart failure ([place])   4. Primary hypertension   5. Coronary artery disease involving native coronary artery of native heart without angina pectoris     PLAN:   1. SOB (shortness of breath) 2. Chronic systolic heart failure ([facility]) -More short of breath with activity.  I would like to recheck her echo.  She is not on appropriate medical therapy for her heart failure.  She had a nonischemic cardiomyopathy in the setting of cardiac arrest.  This was due to polymorphic ventricular tachycardia in the setting of severe electrolyte derangement.  Add back carvedilol 12.5 mg twice daily.  Add Aldactone 12.5 mg daily.  Refill Entresto 24-26 mg twice daily.  She will continue with torsemide as needed.  Not that volume up today.  All of her other labs seem to be within limits.  3. Primary hypertension -Add back medic patients with above  4. Coronary artery disease involving native coronary artery of native heart without angina pectoris -Minimal nonobstructive disease.  Not on a statin.  LDL 54 on no medication.  Okay to forego medication at this time.  5. H/O cardiac arrest -History of torsades or polymorphic ventricular tachycardia in the setting of severe electrolyte derangement.  No further episodes.  EF has recovered.  Nonobstructive disease on cath.  We will continue to monitor this.  No indications for ICD.  She is doing well since her episode in the hospital.   Disposition: Return in about 6 months (around 03/28/2023).  Medication Adjustments/Labs and Tests Ordered: Current medicines are reviewed at length with the  patient today.  Concerns regarding  medicines are outlined above.  Orders Placed This Encounter  Procedures   ECHOCARDIOGRAM COMPLETE   Meds ordered this encounter  Medications   carvedilol (COREG) 12.5 MG tablet    Sig: Take 1 tablet (12.5 mg total) by mouth 2 (two) times daily.    Dispense:  180 tablet    Refill:  3   spironolactone (ALDACTONE) 25 MG tablet    Sig: Take 0.5 tablets (12.5 mg total) by mouth daily.    Dispense:  45 tablet    Refill:  3   sacubitril-valsartan (ENTRESTO) 24-26 MG    Sig: Take 1 tablet by mouth 2 (two) times daily.    Dispense:  60 tablet    Refill:  3    Patient Instructions  Medication Instructions:  START back on Carvedilol 12.5 mg twice daily  START back on Aldactone 12.5 mg daily  Refill of Entresto was sent to the pharmacy  *If you need a refill on your cardiac medications before your next appointment, please call your pharmacy*   Testing/Procedures:  Echocardiogram - Your physician has requested that you have an echocardiogram. Echocardiography is a painless test that uses sound waves to create images of your heart. It provides your doctor with information about the size and shape of your heart and how well your heart's chambers and valves are working. This procedure takes approximately one hour. There are no restrictions for this procedure.     Follow-Up: At [facility], you and your health needs are our priority.  As part of our continuing mission to provide you with exceptional heart care, we have created designated Provider Care Teams.  These Care Teams include your primary Cardiologist (physician) and Advanced Practice Providers (APPs -  Physician Assistants and Nurse Practitioners) who all work together to provide you with the care you need, when you need it.  We recommend signing up for the patient portal called "MyChart".  Sign up information is provided on this After Visit Summary.  MyChart is used to connect with patients  for Virtual Visits (Telemedicine).  Patients are able to view lab/test results, encounter notes, upcoming appointments, etc.  Non-urgent messages can be sent to your provider as well.   To learn more about what you can do with MyChart, go to [email or web address].    Your next appointment:   6 month(s)  Provider:   Evalina Field, MD     Other Instructions Exercise for 20 minutes a day.    Time Spent with Patient: I have spent a total of 35 minutes with patient reviewing hospital notes, telemetry, EKGs, labs and examining the patient as well as establishing an assessment and plan that was discussed with the patient.  > 50% of time was spent in direct patient care.  Signed, Addison Naegeli. Audie Box, MD, [employer]  [address] [place], [place] [address] [phone]  09/25/2022 9:33 AM

## 2022-09-25 ENCOUNTER — Ambulatory Visit: Payer: Medicare HMO | Attending: Cardiovascular Disease | Admitting: Cardiovascular Disease

## 2022-09-25 ENCOUNTER — Encounter: Payer: Self-pay | Admitting: Cardiovascular Disease

## 2022-09-25 VITALS — BP 158/92 | HR 96 | Ht 61.0 in | Wt 173.4 lb

## 2022-09-25 DIAGNOSIS — I10 Essential (primary) hypertension: Secondary | ICD-10-CM

## 2022-09-25 DIAGNOSIS — I5022 Chronic systolic (congestive) heart failure: Secondary | ICD-10-CM

## 2022-09-25 DIAGNOSIS — R0602 Shortness of breath: Secondary | ICD-10-CM

## 2022-09-25 DIAGNOSIS — Z89421 Acquired absence of other right toe(s): Secondary | ICD-10-CM | POA: Diagnosis not present

## 2022-09-25 DIAGNOSIS — Z8674 Personal history of sudden cardiac arrest: Secondary | ICD-10-CM | POA: Diagnosis not present

## 2022-09-25 DIAGNOSIS — I110 Hypertensive heart disease with heart failure: Secondary | ICD-10-CM | POA: Diagnosis not present

## 2022-09-25 DIAGNOSIS — I2510 Atherosclerotic heart disease of native coronary artery without angina pectoris: Secondary | ICD-10-CM

## 2022-09-25 MED ORDER — CARVEDILOL 12.5 MG PO TABS: 12.5000 mg | ORAL_TABLET | Freq: Two times a day (BID) | ORAL | 3 refills | Status: DC

## 2022-09-25 MED ORDER — ENTRESTO 24-26 MG PO TABS: 1.0000 | ORAL_TABLET | Freq: Two times a day (BID) | ORAL | 3 refills | Status: DC

## 2022-09-25 MED ORDER — SPIRONOLACTONE 25 MG PO TABS: 12.5000 mg | ORAL_TABLET | Freq: Every day | ORAL | 3 refills | Status: DC

## 2022-09-25 NOTE — Patient Instructions (Signed)
Medication Instructions:  START back on Carvedilol 12.5 mg twice daily  START back on Aldactone 12.5 mg daily  Refill of Entresto was sent to the pharmacy  *If you need a refill on your cardiac medications before your next appointment, please call your pharmacy*   Testing/Procedures:  Echocardiogram - Your physician has requested that you have an echocardiogram. Echocardiography is a painless test that uses sound waves to create images of your heart. It provides your doctor with information about the size and shape of your heart and how well your heart's chambers and valves are working. This procedure takes approximately one hour. There are no restrictions for this procedure.     Follow-Up: At [facility], you and your health needs are our priority.  As part of our continuing mission to provide you with exceptional heart care, we have created designated Provider Care Teams.  These Care Teams include your primary Cardiologist (physician) and Advanced Practice Providers (APPs -  Physician Assistants and Nurse Practitioners) who all work together to provide you with the care you need, when you need it.  We recommend signing up for the patient portal called "MyChart".  Sign up information is provided on this After Visit Summary.  MyChart is used to connect with patients for Virtual Visits (Telemedicine).  Patients are able to view lab/test results, encounter notes, upcoming appointments, etc.  Non-urgent messages can be sent to your provider as well.   To learn more about what you can do with MyChart, go to [email or web address].    Your next appointment:   6 month(s)  Provider:   Evalina Field, MD     Other Instructions Exercise for 20 minutes a day.

## 2022-10-21 ENCOUNTER — Ambulatory Visit (HOSPITAL_COMMUNITY): Payer: Medicare HMO | Attending: Internal Medicine

## 2022-10-21 DIAGNOSIS — R0602 Shortness of breath: Secondary | ICD-10-CM | POA: Insufficient documentation

## 2022-10-21 LAB — ECHOCARDIOGRAM COMPLETE
Area-P 1/2: 4.1 cm2
S' Lateral: 3 cm

## 2022-12-07 ENCOUNTER — Other Ambulatory Visit: Payer: Self-pay | Admitting: Internal Medicine

## 2022-12-07 DIAGNOSIS — I110 Hypertensive heart disease with heart failure: Secondary | ICD-10-CM

## 2022-12-07 DIAGNOSIS — N280 Ischemia and infarction of kidney: Secondary | ICD-10-CM

## 2022-12-07 DIAGNOSIS — I428 Other cardiomyopathies: Secondary | ICD-10-CM

## 2022-12-12 ENCOUNTER — Other Ambulatory Visit: Payer: Self-pay | Admitting: Internal Medicine

## 2022-12-31 ENCOUNTER — Ambulatory Visit: Payer: Medicare HMO | Admitting: Internal Medicine

## 2022-12-31 ENCOUNTER — Encounter: Payer: Self-pay | Admitting: Internal Medicine

## 2022-12-31 VITALS — BP 126/82 | HR 100 | Temp 98.0°F | Ht 61.0 in | Wt 168.2 lb

## 2022-12-31 DIAGNOSIS — Z1231 Encounter for screening mammogram for malignant neoplasm of breast: Secondary | ICD-10-CM | POA: Diagnosis not present

## 2022-12-31 DIAGNOSIS — Z0001 Encounter for general adult medical examination with abnormal findings: Secondary | ICD-10-CM | POA: Diagnosis not present

## 2022-12-31 DIAGNOSIS — K219 Gastro-esophageal reflux disease without esophagitis: Secondary | ICD-10-CM | POA: Diagnosis not present

## 2022-12-31 DIAGNOSIS — F5101 Primary insomnia: Secondary | ICD-10-CM

## 2022-12-31 DIAGNOSIS — F339 Major depressive disorder, recurrent, unspecified: Secondary | ICD-10-CM

## 2022-12-31 DIAGNOSIS — E6609 Other obesity due to excess calories: Secondary | ICD-10-CM | POA: Diagnosis not present

## 2022-12-31 DIAGNOSIS — Z0000 Encounter for general adult medical examination without abnormal findings: Secondary | ICD-10-CM

## 2022-12-31 DIAGNOSIS — I5022 Chronic systolic (congestive) heart failure: Secondary | ICD-10-CM | POA: Diagnosis not present

## 2022-12-31 DIAGNOSIS — Z6831 Body mass index (BMI) 31.0-31.9, adult: Secondary | ICD-10-CM

## 2022-12-31 DIAGNOSIS — E559 Vitamin D deficiency, unspecified: Secondary | ICD-10-CM

## 2022-12-31 DIAGNOSIS — I110 Hypertensive heart disease with heart failure: Secondary | ICD-10-CM

## 2022-12-31 DIAGNOSIS — Z23 Encounter for immunization: Secondary | ICD-10-CM | POA: Diagnosis not present

## 2022-12-31 LAB — POCT URINALYSIS DIPSTICK
Glucose, UA: NEGATIVE
Ketones, UA: NEGATIVE
Nitrite, UA: NEGATIVE
Protein, UA: NEGATIVE
Spec Grav, UA: >=1.03 — AB (ref 1.010–1.025)
Urobilinogen, UA: 0.2 E.U./dL
pH, UA: 6.5 (ref 5.0–8.0)

## 2022-12-31 MED ORDER — SUCRALFATE 1 G PO TABS: ORAL_TABLET | ORAL | 3 refills | Status: DC

## 2022-12-31 MED ORDER — PANTOPRAZOLE SODIUM 40 MG PO TBEC: 40.0000 mg | DELAYED_RELEASE_TABLET | Freq: Every day | ORAL | 0 refills | Status: DC

## 2022-12-31 NOTE — Patient Instructions (Signed)

## 2022-12-31 NOTE — Progress Notes (Signed)
Subjective:    Patient ID: Debbie Barry , female    DOB: 1950/08/11 , 72 y.o.   MRN: [id number]  Chief Complaint  Patient presents with   Annual Exam   Hypertension    HPI  Patient presents today for annual exam. She reports compliance with medications. She is no longer followed by GYN. Does not wish to have pelvic exam today.  Denies chest pain, SOB and palpitations.    She has regular Cardiology visits every 6 months. Echocardiogram completed on 10/21/2022. She states there are no new cardiac issues.   Hypertension This is a chronic problem. The current episode started more than 1 year ago. Pertinent negatives include no blurred vision or chest pain. Risk factors for coronary artery disease include obesity, sedentary lifestyle and post-menopausal state. Past treatments include ACE inhibitors, calcium channel blockers and diuretics. The current treatment provides mild improvement.     Past Medical History:  Diagnosis Date   Fatty liver    GERD (gastroesophageal reflux disease)    Hypertension    Nausea and vomiting 03/31/2021   Osteomyelitis (HCC)    right third toe   Peripheral vascular disease (HCC)    Ulcer 08/14/20     Family History  Problem Relation Age of Onset   Breast cancer Mother    Colon cancer Mother    Heart Problems Mother    Hypertension Mother    Multiple myeloma Mother    Arthritis Mother    Cancer Mother    Heart disease Mother    Hypertension Father    Heart disease Father    Multiple myeloma Father    Arthritis Father    Cancer Father      Current Outpatient Medications:    acetaminophen (TYLENOL) 325 MG tablet, Take 650 mg by mouth every 6 (six) hours as needed for moderate pain., Disp: , Rfl:    apixaban (ELIQUIS) 5 MG TABS tablet, Take 1 tablet (5 mg total) by mouth 2 (two) times daily., Disp: 60 tablet, Rfl: 5   bismuth subsalicylate (PEPTO BISMOL) 262 MG/15ML suspension, Take 30 mLs by mouth every 6 (six) hours as needed for  indigestion., Disp: , Rfl:    calcium carbonate (TUMS EX) 750 MG chewable tablet, Chew 1 tablet by mouth daily as needed for heartburn., Disp: , Rfl:    carvedilol (COREG) 12.5 MG tablet, Take 1 tablet (12.5 mg total) by mouth 2 (two) times daily., Disp: 180 tablet, Rfl: 3   Cholecalciferol (VITAMIN D3) 5000 units CAPS, Take 5,000 Units by mouth daily., Disp: , Rfl:    Magnesium 500 MG TABS, Take 1 tablet by mouth daily., Disp: , Rfl:    Multiple Vitamin (MULTIVITAMIN WITH MINERALS) TABS tablet, Take 1 tablet by mouth daily., Disp: , Rfl:    potassium chloride SA (KLOR-CON M) 20 MEQ tablet, TAKE 1 TABLET(20 MEQ) BY MOUTH TWICE DAILY, Disp: 60 tablet, Rfl: 2   sacubitril-valsartan (ENTRESTO) 24-26 MG, Take 1 tablet by mouth 2 (two) times daily., Disp: 60 tablet, Rfl: 3   spironolactone (ALDACTONE) 25 MG tablet, Take 0.5 tablets (12.5 mg total) by mouth daily., Disp: 45 tablet, Rfl: 3   ondansetron (ZOFRAN ODT) 4 MG disintegrating tablet, Take 1 tablet (4 mg total) by mouth every 8 (eight) hours as needed for nausea or vomiting. (Patient not taking: Reported on 12/31/2022), Disp: 20 tablet, Rfl: 0   pantoprazole (PROTONIX) 40 MG tablet, Take 1 tablet (40 mg total) by mouth daily., Disp: 90 tablet, Rfl: 0  sertraline (ZOLOFT) 25 MG tablet, Take 1 tablet (25 mg total) by mouth daily., Disp: 90 tablet, Rfl: 1   sucralfate (CARAFATE) 1 g tablet, TAKE 1 TABLET(1 GRAM) BY MOUTH TWICE DAILY prn, Disp: 60 tablet, Rfl: 3   torsemide (DEMADEX) 20 MG tablet, Take 1 tablet (20 mg total) by mouth 2 (two) times daily., Disp: 180 tablet, Rfl: 0   No Known Allergies    The patient states she uses post menopausal status for birth control. No LMP recorded. Patient is postmenopausal.. Negative for Dysmenorrhea. Negative for: breast discharge, breast lump(s), breast pain and breast self exam. Associated symptoms include abnormal vaginal bleeding. Pertinent negatives include abnormal bleeding (hematology), anxiety,  decreased libido, depression, difficulty falling sleep, dyspareunia, history of infertility, nocturia, sexual dysfunction, sleep disturbances, urinary incontinence, urinary urgency, vaginal discharge and vaginal itching. Diet regular.The patient states her exercise level is  intermittent.  . The patient's tobacco use is:  Social History   Tobacco Use  Smoking Status Never  Smokeless Tobacco Never  . She has been exposed to passive smoke. The patient's alcohol use is:  Social History   Substance and Sexual Activity  Alcohol Use Not Currently   Comment: social glass of wine   Review of Systems  Constitutional: Negative.   HENT: Negative.    Eyes: Negative.  Negative for blurred vision.  Respiratory: Negative.    Cardiovascular: Negative.  Negative for chest pain.  Gastrointestinal: Negative.   Endocrine: Negative.   Genitourinary: Negative.   Musculoskeletal: Negative.   Skin: Negative.   Allergic/Immunologic: Negative.   Neurological: Negative.   Hematological: Negative.   Psychiatric/Behavioral: Negative.       Today's Vitals   12/31/22 1115  BP: 126/82  Pulse: 100  Temp: 98 F (36.7 C)  SpO2: 98%  Weight: 168 lb 3.2 oz (76.3 kg)  Height: 5\' 1"  (1.549 m)   Body mass index is 31.78 kg/m.  Wt Readings from Last 3 Encounters:  12/31/22 168 lb 3.2 oz (76.3 kg)  09/25/22 173 lb 6.4 oz (78.7 kg)  07/25/22 170 lb 6.4 oz (77.3 kg)     Objective:  Physical Exam Vitals and nursing note reviewed.  Constitutional:      Appearance: Normal appearance. She is obese.  HENT:     Head: Normocephalic and atraumatic.     Right Ear: Tympanic membrane, ear canal and external ear normal.     Left Ear: Tympanic membrane, ear canal and external ear normal.     Nose: Nose normal.     Mouth/Throat:     Mouth: Mucous membranes are moist.     Pharynx: Oropharynx is clear.  Eyes:     Extraocular Movements: Extraocular movements intact.     Conjunctiva/sclera: Conjunctivae normal.      Pupils: Pupils are equal, round, and reactive to light.  Cardiovascular:     Rate and Rhythm: Normal rate and regular rhythm.     Pulses: Normal pulses.     Heart sounds: Normal heart sounds.  Pulmonary:     Effort: Pulmonary effort is normal.     Breath sounds: Normal breath sounds.  Chest:  Breasts:    Tanner Score is 5.     Right: Normal.     Left: Normal.  Abdominal:     General: Abdomen is flat. Bowel sounds are normal.     Palpations: Abdomen is soft.  Genitourinary:    Comments: deferred Musculoskeletal:        General: Normal range of motion.  Cervical back: Normal range of motion and neck supple.  Skin:    General: Skin is warm and dry.  Neurological:     General: No focal deficit present.     Mental Status: She is alert and oriented to person, place, and time.  Psychiatric:        Mood and Affect: Mood normal.        Behavior: Behavior normal.         Assessment And Plan:     Encounter for annual health examination Assessment & Plan: A full exam was performed. I plan to perform pelvic exam at her next physical. She is encouraged to perform monthly self breast exams. PATIENT IS ADVISED TO GET 30-45 MINUTES REGULAR EXERCISE NO LESS THAN FOUR TO FIVE DAYS PER WEEK - BOTH WEIGHTBEARING EXERCISES AND AEROBIC ARE RECOMMENDED.  PATIENT IS ADVISED TO FOLLOW A HEALTHY DIET WITH AT LEAST SIX FRUITS/VEGGIES PER DAY, DECREASE INTAKE OF RED MEAT, AND TO INCREASE FISH INTAKE TO TWO DAYS PER WEEK.  MEATS/FISH SHOULD NOT BE FRIED, BAKED OR BROILED IS PREFERABLE.  IT IS ALSO IMPORTANT TO CUT BACK ON YOUR SUGAR INTAKE. PLEASE AVOID ANYTHING WITH ADDED SUGAR, CORN SYRUP OR OTHER SWEETENERS. IF YOU MUST USE A SWEETENER, YOU CAN TRY STEVIA. IT IS ALSO IMPORTANT TO AVOID ARTIFICIALLY SWEETENERS AND DIET BEVERAGES. LASTLY, I SUGGEST WEARING SPF 50 SUNSCREEN ON EXPOSED PARTS AND ESPECIALLY WHEN IN THE DIRECT SUNLIGHT FOR AN EXTENDED PERIOD OF TIME.  PLEASE AVOID FAST FOOD RESTAURANTS AND  INCREASE YOUR WATER INTAKE.    Hypertensive heart disease with chronic systolic congestive heart failure (HCC) Assessment & Plan: Chronic, improved control. Importance of medication and dietary compliance was discussed with the patient. EKG performed: NSR w/ low voltage in precordial leads, negative T-waves, anterior ischemia. Most recent Cardiology notes reviewed. She will c/2 carvedilol 12.5mg  twice daily, Entresto 24/26mg  twice daily, spironolactone 25mg  1/2 tab daily and torsemide twice daily. Importance of salt restriction was discussed with the patient.   Orders: -     POCT urinalysis dipstick -     Microalbumin / creatinine urine ratio -     EKG 12-Lead -     CMP14+EGFR -     CBC -     Magnesium  Gastroesophageal reflux disease without esophagitis Assessment & Plan: Chronic, I will renew pantoprazole. She will continue to use sucralfate prn. Reminded to stop eating 3 hrs prior to lying down and to avoid known triggers.    Major depression, recurrent, chronic (HCC) Assessment & Plan: Chronic, she admits she has not been taking sertraline regularly. Needs refill. I will resume sertraline 25mg  daily. She agrees to rto in six weeks for re-evaluation. She agrees to virtual visit.   Orders: -     Sertraline HCl; Take 1 tablet (25 mg total) by mouth daily.  Dispense: 90 tablet; Refill: 1  Class 1 obesity due to excess calories with serious comorbidity and body mass index (BMI) of 31.0 to 31.9 in adult Assessment & Plan: She is encouraged to aim for at least 150 minutes of exercise/week, while striving for BMI<30 to decrease cardiac risk.    Encounter for screening mammogram for malignant neoplasm of breast -     3D Screening Mammogram, Left and Right; Future  Immunization due -     Pneumococcal conjugate vaccine 20-valent  Other orders -     Pantoprazole Sodium; Take 1 tablet (40 mg total) by mouth daily.  Dispense: 90 tablet; Refill: 0 -  Sucralfate; TAKE 1 TABLET(1 GRAM)  BY MOUTH TWICE DAILY prn  Dispense: 60 tablet; Refill: 3     Return in about 6 weeks (around 02/11/2023), or zoloft, protonix f/u - virtual as per patient, for 1 year physical, 6 month bp. Patient was given opportunity to ask questions. Patient verbalized understanding of the plan and was able to repeat key elements of the plan. All questions were answered to their satisfaction.     I, Gwynneth Aliment, MD, have reviewed all documentation for this visit. The documentation on 12/31/22 for the exam, diagnosis, procedures, and orders are all accurate and complete.

## 2023-01-01 LAB — CMP14+EGFR
ALT: 35 IU/L — ABNORMAL HIGH (ref 0–32)
AST: 67 IU/L — ABNORMAL HIGH (ref 0–40)
Albumin: 3.5 g/dL — ABNORMAL LOW (ref 3.9–4.9)
Alkaline Phosphatase: 207 IU/L — ABNORMAL HIGH (ref 44–121)
BUN/Creatinine Ratio: 6 — ABNORMAL LOW (ref 12–28)
BUN: 4 mg/dL — ABNORMAL LOW (ref 8–27)
Bilirubin Total: 0.4 mg/dL (ref 0.0–1.2)
CO2: 21 mmol/L (ref 20–29)
Calcium: 8.9 mg/dL (ref 8.7–10.3)
Chloride: 101 mmol/L (ref 96–106)
Creatinine, Ser: 0.62 mg/dL (ref 0.57–1.00)
Globulin, Total: 2.8 g/dL (ref 1.5–4.5)
Glucose: 81 mg/dL (ref 70–99)
Potassium: 4.5 mmol/L (ref 3.5–5.2)
Sodium: 143 mmol/L (ref 134–144)
Total Protein: 6.3 g/dL (ref 6.0–8.5)
eGFR: 96 mL/min/{1.73_m2} (ref >59)

## 2023-01-01 LAB — CBC
Hematocrit: 49.4 % — ABNORMAL HIGH (ref 34.0–46.6)
Hemoglobin: 16 g/dL — ABNORMAL HIGH (ref 11.1–15.9)
MCH: 31.7 pg (ref 26.6–33.0)
MCHC: 32.4 g/dL (ref 31.5–35.7)
MCV: 98 fL — ABNORMAL HIGH (ref 79–97)
Platelets: 147 10*3/uL — ABNORMAL LOW (ref 150–450)
RBC: 5.05 x10E6/uL (ref 3.77–5.28)
RDW: 12.3 % (ref 11.7–15.4)
WBC: 5.5 10*3/uL (ref 3.4–10.8)

## 2023-01-01 LAB — MICROALBUMIN / CREATININE URINE RATIO
Creatinine, Urine: 209.1 mg/dL
Microalb/Creat Ratio: 4 mg/g creat (ref 0–29)
Microalbumin, Urine: 7.9 ug/mL

## 2023-01-01 LAB — MAGNESIUM: Magnesium: 1.9 mg/dL (ref 1.6–2.3)

## 2023-01-18 ENCOUNTER — Other Ambulatory Visit: Payer: Self-pay | Admitting: Nurse Practitioner

## 2023-01-18 DIAGNOSIS — F5101 Primary insomnia: Secondary | ICD-10-CM | POA: Insufficient documentation

## 2023-01-18 DIAGNOSIS — K219 Gastro-esophageal reflux disease without esophagitis: Secondary | ICD-10-CM | POA: Insufficient documentation

## 2023-01-18 DIAGNOSIS — Z0000 Encounter for general adult medical examination without abnormal findings: Secondary | ICD-10-CM | POA: Insufficient documentation

## 2023-01-18 DIAGNOSIS — F3289 Other specified depressive episodes: Secondary | ICD-10-CM

## 2023-01-18 MED ORDER — SERTRALINE HCL 25 MG PO TABS: 25.0000 mg | ORAL_TABLET | Freq: Every day | ORAL | 1 refills | Status: DC

## 2023-01-18 NOTE — Assessment & Plan Note (Addendum)
Chronic, improved control. Importance of medication and dietary compliance was discussed with the patient. EKG performed: NSR w/ low voltage in precordial leads, negative T-waves, anterior ischemia. Most recent Cardiology notes reviewed. She will c/2 carvedilol 12.5mg  twice daily, Entresto 24/26mg  twice daily, spironolactone 25mg  1/2 tab daily and torsemide twice daily. Importance of salt restriction was discussed with the patient.

## 2023-01-18 NOTE — Assessment & Plan Note (Signed)
Chronic, she admits she has not been taking sertraline regularly. Needs refill. I will resume sertraline 25mg  daily. She agrees to rto in six weeks for re-evaluation. She agrees to virtual visit.

## 2023-01-18 NOTE — Assessment & Plan Note (Signed)
A full exam was performed. I plan to perform pelvic exam at her next physical. She is encouraged to perform monthly self breast exams. PATIENT IS ADVISED TO GET 30-45 MINUTES REGULAR EXERCISE NO LESS THAN FOUR TO FIVE DAYS PER WEEK - BOTH WEIGHTBEARING EXERCISES AND AEROBIC ARE RECOMMENDED.  PATIENT IS ADVISED TO FOLLOW A HEALTHY DIET WITH AT LEAST SIX FRUITS/VEGGIES PER DAY, DECREASE INTAKE OF RED MEAT, AND TO INCREASE FISH INTAKE TO TWO DAYS PER WEEK.  MEATS/FISH SHOULD NOT BE FRIED, BAKED OR BROILED IS PREFERABLE.  IT IS ALSO IMPORTANT TO CUT BACK ON YOUR SUGAR INTAKE. PLEASE AVOID ANYTHING WITH ADDED SUGAR, CORN SYRUP OR OTHER SWEETENERS. IF YOU MUST USE A SWEETENER, YOU CAN TRY STEVIA. IT IS ALSO IMPORTANT TO AVOID ARTIFICIALLY SWEETENERS AND DIET BEVERAGES. LASTLY, I SUGGEST WEARING SPF 50 SUNSCREEN ON EXPOSED PARTS AND ESPECIALLY WHEN IN THE DIRECT SUNLIGHT FOR AN EXTENDED PERIOD OF TIME.  PLEASE AVOID FAST FOOD RESTAURANTS AND INCREASE YOUR WATER INTAKE.

## 2023-01-18 NOTE — Assessment & Plan Note (Signed)
Chronic, I will renew pantoprazole. She will continue to use sucralfate prn. Reminded to stop eating 3 hrs prior to lying down and to avoid known triggers.

## 2023-01-18 NOTE — Assessment & Plan Note (Signed)
She is encouraged to aim for at least 150 minutes of exercise /week, while striving for BMI<30 to decrease cardiac risk.

## 2023-02-05 ENCOUNTER — Other Ambulatory Visit: Payer: Medicare HMO | Admitting: Pharmacist

## 2023-02-05 NOTE — Progress Notes (Signed)
I have reviewed the pharmacist's encounter and agree with their documentation.   Catie Eppie Gibson, PharmD, BCACP, CPP [facility] [phone]

## 2023-02-05 NOTE — Progress Notes (Addendum)
02/05/2023 Name: Debbie Barry MRN: [id number] DOB: Aug 31, 1950  Chief Complaint  Patient presents with   Medication Management    Debbie Barry is a 72 y.o. year old female who presented for a telephone visit.   They were referred to the pharmacist by their PCP for assistance in managing complex medication management.   PMH of nonischemic cardiomyopathy/HFrEF (recovered EF of 55% on Apr 2024 ECHO, originally 25-30% in Nov 2022) after cardiac arrest ([place] with severe electrolyte derangement), HTN, DVT with acute renal infarct (2022), gastritis, GERD.  Subjective: Pt is doing alright. At PCP appointment last month, thought that Dr. Allyne Gee was going to increase sertraline to 50 mg daily but prescription was sent in for 25 mg. Would like to take 50 mg daily for better efficacy (sometimes takes 2 tabs of 25 mg). Takes torsemide as needed for swelling (hasn't taken in 2 or 3 months, has a lot left). Not taking carvedilol because of diarrhea (stopped a month ago). Takes OTC diarrhea medicine every day (Imodium - 1 tab (2 mg) twice daily, usually takes before eating). Has diarrhea after eating every day - has been ongoing for a couple years since hospitalization in 2022 where she was found to have gastritis and ulcer.   Also has a question about one of her cardiology medications. Dr. Flora Lipps restarted spironolactone in march 2024 > reports that this medication gives her bad headaches. Correlates with timing of medication. Took a 1/2 tab yesterday after holding for 3-4 days (had no headaches).  Dr. Allyne Gee told her to stop taking tylenol. Was previously taking 2 tabs daily once or twice daily. Has hip pain due to hip replacement.  Has been out of Eliquis ($45 for one month > too expensive) > ran out two weeks ago. Had started taking 1/2 pill BID to save supply. Monthly income ~$3000/month between her and her spouse. She did discuss with Dr. Flora Lipps whether she should continue taking this  medication - per patient he said to continue taking for now but did give a timeline.   Has grant for Entresto ($0 copay)  Takes 1/2 pill of sucralfate every now and then > when she plans to eat something greasy or fatty.   Care Team: Primary Care Provider: Dorothyann Peng, MD ; Next Scheduled Visit: 07/07/2023  Medication Access/Adherence  Current Pharmacy:  [facility] [phone] Ginette Otto, [place] - [address] AT [facility] [address] [facility] [address] [place] [address] Phone: [phone] Fax: [phone]  [facility] [place], [place] [address] [place] [address] Phone: [phone] Fax: [phone]  [facility] [address] [place] [address] Phone: [phone] Fax: [phone]   Patient reports affordability concerns with their medications: Yes  Eliquis ($45/month) is expensive. Ran out 2 weeks ago. Had been cutting pills in half to save supply.  Patient reports access/transportation concerns to their pharmacy: No  Patient reports adherence concerns with their medications:  No. Uses pill box, able to remember to take medications. Nonadherence stems from side effects/tolerability issues.    Heart Failure:  Current medications:  ACEi/ARB/ARNI: Entresto (sacubiltril/valsartan) 24/26 mg BID SGLT2i: none Beta blocker: prescribed carvedilol 12.5 mg BID, but stopped due to worsening diarrhea Mineralocorticoid Receptor Antagonist: 12.5 mg spironolactone daily  Diuretic regimen: torsemide 20 mg PRN swelling (has not taken in 2-3 months)  Current home blood pressure readings: Has BP cuff but  only takes when she feels dizzy/lightheaded. Has not taken recently.  Current home weights: stable  Patient denies volume overload signs or symptoms including shortness of breath, lower extremity edema, increased use of pillows at night  Drinking 1  bottle water/day. Drinks ginger ale, tea.   Current medication access support: Entresto grant ($0 copay)  Medication Management:  Current adherence strategy: Uses pill box.  Patient reports Good adherence to medications, however she has stopped or adjusted several medications due to tolerability issues and cost.  - No longer taking carvedilol (1 mo) due to worsening diarrhea - Held spironolactone briefly due to daily headaches. Has restarted 1/2 tab (12.5 mg) daily and has not experienced headache so far.  - Has been out of Eliquis (apixaban) for 2 weeks due to being out of refills and cost of $45/month being high. Prior to this she had been splitting tablets.  Patient reports the following barriers to adherence: side effects, cost (Eliquis)  Recent fill dates: - carvedilol 12.5 mg 90D supply 09/25/22 - sacubitril/valsartan 24/26 mg 30D supply 12/24/22 - spironolactone 12.5 mg 90D supply 09/25/22 (had been using old bottle)  Objective:  Lab Results  Component Value Date   HGBA1C 4.8 07/25/2022    Lab Results  Component Value Date   CREATININE 0.62 12/31/2022   BUN 4 (L) 12/31/2022   NA 143 12/31/2022   K 4.5 12/31/2022   CL 101 12/31/2022   CO2 21 12/31/2022    Lab Results  Component Value Date   CHOL 138 07/25/2022   HDL 62 07/25/2022   LDLCALC 54 07/25/2022   TRIG 130 07/25/2022   CHOLHDL 2.2 07/25/2022    Medications Reviewed Today     Reviewed by Particia Lather, RPH (Pharmacist) on 02/05/23 at 1116  Med List Status: <None>   Medication Order Taking? Sig Documenting Provider Last Dose Status Informant  acetaminophen (TYLENOL) 325 MG tablet [id number] No Take 650 mg by mouth every 6 (six) hours as needed for moderate pain.  Patient not taking: Reported on 02/05/2023   [provider] Not Taking Active Self  apixaban (ELIQUIS) 5 MG TABS tablet [id number] No Take 1 tablet (5 mg total) by mouth 2 (two) times daily.  Patient not taking: Reported on 02/05/2023    O'Neal, Ronnald Ramp, MD Not Taking Active            Med Note Particia Lather   Wed Feb 05, 2023 10:23 AM) Ran out two weeks ago. Had been taking 1/2 tab BID to conserve.  bismuth subsalicylate (PEPTO BISMOL) 262 MG/15ML suspension [id number] No Take 30 mLs by mouth every 6 (six) hours as needed for indigestion.  Patient not taking: Reported on 02/05/2023   [provider] Not Taking Active Self  calcium carbonate (TUMS EX) 750 MG chewable tablet [id number] No Chew 1 tablet by mouth daily as needed for heartburn.  Patient not taking: Reported on 02/05/2023   [provider] Not Taking Active Self  carvedilol (COREG) 12.5 MG tablet [id number] No Take 1 tablet (12.5 mg total) by mouth 2 (two) times daily.  Patient not taking: Reported on 02/05/2023   Sande Rives, MD Not Taking Active            Med Note Particia Lather   Wed Feb 05, 2023 10:24 AM) Stopped taking bc of diarrhea  Cholecalciferol (VITAMIN D3) 5000 units CAPS [id number] Yes Take 5,000 Units by mouth daily. [provider] Taking Active Self  Magnesium 500 MG TABS  [id number] Yes Take 1 tablet by mouth daily. [provider] Taking Active   Multiple Vitamin (MULTIVITAMIN WITH MINERALS) TABS tablet [id number] Yes Take 1 tablet by mouth daily. [provider] Taking Active Self  ondansetron (ZOFRAN ODT) 4 MG disintegrating tablet [id number] No Take 1 tablet (4 mg total) by mouth every 8 (eight) hours as needed for nausea or vomiting.  Patient not taking: Reported on 12/31/2022   Narda Bonds, MD Not Taking Active Self           Med Note Servando Salina Apr 30, 2021  4:11 PM)    pantoprazole (PROTONIX) 40 MG tablet [id number] Yes Take 1 tablet (40 mg total) by mouth daily. Dorothyann Peng, MD Taking Active   potassium chloride SA (KLOR-CON M) 20 MEQ tablet [id number] Yes TAKE 1 TABLET(20 MEQ) BY MOUTH TWICE DAILY O'Neal, Ronnald Ramp, MD Taking Active   sacubitril-valsartan [facility]) 24-26  MG [id number] Yes Take 1 tablet by mouth 2 (two) times daily. Sande Rives, MD Taking Active   sertraline (ZOLOFT) 25 MG tablet [id number] Yes Take 1 tablet (25 mg total) by mouth daily. Dorothyann Peng, MD Taking Active            Med Note Particia Lather   Wed Feb 05, 2023 10:17 AM) Taking 50 mg occasionally  spironolactone (ALDACTONE) 25 MG tablet [id number] Yes Take 0.5 tablets (12.5 mg total) by mouth daily. Sande Rives, MD Taking Active   sucralfate (CARAFATE) 1 g tablet [id number] Yes TAKE 1 TABLET(1 GRAM) BY MOUTH TWICE DAILY prn Dorothyann Peng, MD Taking Active   torsemide (DEMADEX) 20 MG tablet [id number]  Take 1 tablet (20 mg total) by mouth 2 (two) times daily. Sande Rives, MD  Expired 11/03/22 2359             Assessment/Plan:   Heart Failure: - Currently appropriately managed given ongoing tolerability issues, recent ECHO with improved LVEF, and lack of patient reported symptoms. Could consider increasing dose of Entresto if further BP control is warranted with patient being off of carvedilol. Will f/u tolerability of spironolactone 12.5 mg daily.  - Reviewed appropriate blood pressure monitoring technique and reviewed goal blood pressure - Reviewed to weigh daily and when to contact cardiology with weight gain - Reviewed dietary modifications including increasing water intake to prevent dehydration and cramping - Recommend to continue current regimen: spironolactone 12.5 mg daily, Entresto (sacubitril/valsartan) 24/26 mg BID    Medication Management: Pt facing ongoing barriers to adherence including high cost of medications (Eliquis) and tolerability issues. Pt reports that she would be able to pick up Eliquis (apixaban) for $45 this week, but needs refills - will coordinate with cardiology MD and PharmD to facilitate med access.  Pt reported household income and total healthcare spend unlikely to qualify for Eliquis patient assistance, but will send link to  application for patient to review. Also will clarify long term plan for DVT ppx. Pt experienced provoked DVT and renal infarct (after prolonged hospitalization/immobility) in 2022, and has since been treated with Eliquis. Could explore option of reduced dose ppx vs discontinuation. Did not discuss current mobility status today - which would be important for assessing ongoing VTE risk.  - Coordinated with cardiology concerning Eliquis (apixaban) access - Coordinated with PCP Allyne Gee concerning pt request for increased dose of sertraline to 50 mg daily.  Follow Up Plan: pharmacist telephone 6 weeks (03/19/23)  Nils Pyle, PharmD PGY1 Pharmacy Resident

## 2023-02-05 NOTE — Patient Instructions (Addendum)
It was great talking with you today Debbie Barry.   Here is the Eliquis patient assistance application for you to review. Of note, the requirement that you spend 3% of your total household income applies to expenses for both you and your spouse. Let us know if you think we should attempt the application.   [email or web address]  Let us know if you have any questions or concerns! We will work on getting you a new prescription for Eliquis.

## 2023-02-25 ENCOUNTER — Ambulatory Visit: Admission: RE | Admit: 2023-02-25 | Payer: Medicare HMO | Source: Ambulatory Visit

## 2023-02-25 DIAGNOSIS — Z1231 Encounter for screening mammogram for malignant neoplasm of breast: Secondary | ICD-10-CM | POA: Diagnosis not present

## 2023-02-25 NOTE — Patient Instructions (Signed)
Managing Depression, Adult Depression is a mental health condition that affects your thoughts, feelings, and actions. Being diagnosed with depression can bring you relief if you did not know why you have felt or behaved a certain way. It could also leave you feeling overwhelmed. Finding ways to manage your symptoms can help you feel more positive about your future. How to manage lifestyle changes Being depressed is difficult. Depression can increase the level of everyday stress. Stress can make depression symptoms worse. You may believe your symptoms cannot be managed or will never improve. However, there are many things you can try to help manage your symptoms. There is hope. Managing stress  Stress is your body's reaction to life changes and events, both good and bad. Stress can add to your feelings of depression. Learning to manage your stress can help lessen your feelings of depression. Try some of the following approaches to reducing your stress (stress reduction techniques): Listen to music that you enjoy and that inspires you. Try using a meditation app or take a meditation class. Develop a practice that helps you connect with your spiritual self. Walk in nature, pray, or go to a place of worship. Practice deep breathing. To do this, inhale slowly through your nose. Pause at the top of your inhale for a few seconds and then exhale slowly, letting yourself relax. Repeat this three or four times. Practice yoga to help relax and work your muscles. Choose a stress reduction technique that works for you. These techniques take time and practice to develop. Set aside 5-15 minutes a day to do them. Therapists can offer training in these techniques. Do these things to help manage stress: Keep a journal. Know your limits. Set healthy boundaries for yourself and others, such as saying "no" when you think something is too much. Pay attention to how you react to certain situations. You may not be able to  control everything, but you can change your reaction. Add humor to your life by watching funny movies or shows. Make time for activities that you enjoy and that relax you. Spend less time using electronics, especially at night before bed. The light from screens can make your brain think it is time to get up rather than go to bed.  Medicines Medicines, such as antidepressants, are often a part of treatment for depression. Talk with your pharmacist or health care provider about all the medicines, supplements, and herbal products that you take, their possible side effects, and what medicines and other products are safe to take together. Make sure to report any side effects you may have to your health care provider. Relationships Your health care provider may suggest family therapy, couples therapy, or individual therapy as part of your treatment. How to recognize changes Everyone responds differently to treatment for depression. As you recover from depression, you may start to: Have more interest in doing activities. Feel more hopeful. Have more energy. Eat a more regular amount of food. Have better mental focus. It is important to recognize if your depression is not getting better or is getting worse. The symptoms you had in the beginning may return, such as: Feeling tired. Eating too much or too little. Sleeping too much or too little. Feeling restless, agitated, or hopeless. Trouble focusing or making decisions. Having unexplained aches and pains. Feeling irritable, angry, or aggressive. If you or your family members notice these symptoms coming back, let your health care provider know right away. Follow these instructions at home: Activity Try to   get some form of exercise each day, such as walking. Try yoga, mindfulness, or other stress reduction techniques. Participate in group activities if you are able. Lifestyle Get enough sleep. Cut down on or stop using caffeine, tobacco,  alcohol, and any other harmful substances. Eat a healthy diet that includes plenty of vegetables, fruits, whole grains, low-fat dairy products, and lean protein. Limit foods that are high in solid fats, added sugar, or salt (sodium). General instructions Take over-the-counter and prescription medicines only as told by your health care provider. Keep all follow-up visits. It is important for your health care provider to check on your mood, behavior, and medicines. Your health care provider may need to make changes to your treatment. Where to find support Talking to others  Friends and family members can be sources of support and guidance. Talk to trusted friends or family members about your condition. Explain your symptoms and let them know that you are working with a health care provider to treat your depression. Tell friends and family how they can help. Finances Find mental health providers that fit with your financial situation. Talk with your health care provider if you are worried about access to food, housing, or medicine. Call your insurance company to learn about your co-pays and prescription plan. Where to find more information You can find support in your area from: Anxiety and Depression Association of America (ADAA): adaa.org Mental Health America: mentalhealthamerica.net National Alliance on Mental Illness: nami.org Contact a health care provider if: You stop taking your antidepressant medicines, and you have any of these symptoms: Nausea. Headache. Light-headedness. Chills and body aches. Not being able to sleep (insomnia). You or your friends and family think your depression is getting worse. Get help right away if: You have thoughts of hurting yourself or others. Get help right away if you feel like you may hurt yourself or others, or have thoughts about taking your own life. Go to your nearest emergency room or: Call 911. Call the National Suicide Prevention Lifeline at  1-800-273-8255 or 988. This is open 24 hours a day. Text the Crisis Text Line at 741741. This information is not intended to replace advice given to you by your health care provider. Make sure you discuss any questions you have with your health care provider. Document Revised: 10/23/2021 Document Reviewed: 10/23/2021 Elsevier Patient Education  2024 Elsevier Inc.  

## 2023-02-26 ENCOUNTER — Telehealth (INDEPENDENT_AMBULATORY_CARE_PROVIDER_SITE_OTHER): Payer: Medicare HMO | Admitting: Internal Medicine

## 2023-02-26 ENCOUNTER — Encounter: Payer: Self-pay | Admitting: Internal Medicine

## 2023-02-26 DIAGNOSIS — K219 Gastro-esophageal reflux disease without esophagitis: Secondary | ICD-10-CM

## 2023-02-26 DIAGNOSIS — F339 Major depressive disorder, recurrent, unspecified: Secondary | ICD-10-CM | POA: Diagnosis not present

## 2023-02-26 DIAGNOSIS — I110 Hypertensive heart disease with heart failure: Secondary | ICD-10-CM

## 2023-02-26 MED ORDER — SERTRALINE HCL 50 MG PO TABS: 50.0000 mg | ORAL_TABLET | Freq: Every day | ORAL | 2 refills | Status: DC

## 2023-02-26 NOTE — Progress Notes (Signed)
Virtual Visit via Video   This visit type was conducted due to national recommendations for restrictions regarding the COVID-19 Pandemic (e.g. social distancing) in an effort to limit this patient's exposure and mitigate transmission in our community.  Due to her co-morbid illnesses, this patient is at least at moderate risk for complications without adequate follow up.  This format is felt to be most appropriate for this patient at this time.  All issues noted in this document were discussed and addressed.  A limited physical exam was performed with this format.    This visit type was conducted due to national recommendations for restrictions regarding the COVID-19 Pandemic (e.g. social distancing) in an effort to limit this patient's exposure and mitigate transmission in our community.  Patients identity confirmed using two different identifiers.  This format is felt to be most appropriate for this patient at this time.  All issues noted in this document were discussed and addressed.  No physical exam was performed (except for noted visual exam findings with Video Visits).    Date:  03/03/2023   ID:  Debbie Barry, Debbie Barry 1950-12-04, MRN [id number]  Patient Location:  Home  Provider location:   Office    Chief Complaint:  "I have sertraline f/u"  History of Present Illness:    Debbie Barry is a 72 y.o. female who presents via video conferencing for a telehealth visit today.    The patient does not have symptoms concerning for COVID-19 infection (fever, chills, cough, or new shortness of breath).   Patient presents today for virtual visit for med check. She reports compliance with Protonix & Zoloft. She has not had any issues with either medication.      Past Medical History:  Diagnosis Date   Fatty liver    GERD (gastroesophageal reflux disease)    Hypertension    Nausea and vomiting 03/31/2021   Osteomyelitis (HCC)    right third toe   Peripheral vascular disease  (HCC)    Ulcer 08/14/20   Past Surgical History:  Procedure Laterality Date   AMPUTATION Right 08/08/2017   Procedure: AMPUTATION RIGHT 3RD TOE;  Surgeon: Nadara Mustard, MD;  Location: [facility] OR;  Service: Orthopedics;  Laterality: Right;   BIOPSY  08/14/2020   Procedure: BIOPSY;  Surgeon: Vida Rigger, MD;  Location: [facility] ENDOSCOPY;  Service: Endoscopy;;   Bunionectomy Right 2006     Right   Bunionectony Left 2006      August   COLONOSCOPY N/A 01/21/2014   Procedure: COLONOSCOPY;  Surgeon: Theda Belfast, MD;  Location: [facility] ENDOSCOPY;  Service: Endoscopy;  Laterality: N/A;   ESOPHAGOGASTRODUODENOSCOPY (EGD) WITH PROPOFOL N/A 08/14/2020   Procedure: ESOPHAGOGASTRODUODENOSCOPY (EGD) WITH PROPOFOL;  Surgeon: Vida Rigger, MD;  Location: [facility] END[facility];  Service: Endoscopy;  Laterality: N/A;   IR THORACENTESIS ASP PLEURAL SPACE W/IMG GUIDE  05/03/2021   RIGHT/LEFT HEART CATH AND CORONARY ANGIOGRAPHY N/A 05/11/2021   Procedure: RIGHT/LEFT HEART CATH AND CORONARY ANGIOGRAPHY;  Surgeon: Lennette Bihari, MD;  Location: [facility];  Service: Cardiovascular;  Laterality: N/A;   TOTAL HIP ARTHROPLASTY Left 05/11/2022   TUBAL LIGATION     WISDOM TOOTH EXTRACTION       Current Meds  Medication Sig   sertraline (ZOLOFT) 50 MG tablet Take 1 tablet (50 mg total) by mouth daily.     Allergies:   Carvedilol   Social History   Tobacco Use   Smoking status: Never   Smokeless tobacco: Never  Vaping Use   Vaping status: Never Used  Substance Use Topics   Alcohol use: Not Currently    Comment: social glass of wine   Drug use: No     Family Hx: The patient's family history includes Arthritis in her father and mother; Breast cancer in her mother; Cancer in her father and mother; Colon cancer in her mother; Heart Problems in her mother; Heart disease in her father and mother; Hypertension in her father and mother; Multiple myeloma in her father and mother.  ROS:   Please see the history of  present illness.    Review of Systems  Constitutional: Negative.   HENT: Negative.    Respiratory: Negative.    Cardiovascular: Negative.   Gastrointestinal: Negative.   Genitourinary: Negative.   Skin: Negative.   Neurological: Negative.   Endo/Heme/Allergies: Negative.   Psychiatric/Behavioral: Negative.      All other systems reviewed and are negative.   Labs/Other Tests and Data Reviewed:    Recent Labs: 07/25/2022: BNP 118.3; TSH 2.700 12/31/2022: ALT 35; BUN 4; Creatinine, Ser 0.62; Hemoglobin 16.0; Magnesium 1.9; Platelets 147; Potassium 4.5; Sodium 143   Recent Lipid Panel Lab Results  Component Value Date/Time   CHOL 138 07/25/2022 12:02 PM   TRIG 130 07/25/2022 12:02 PM   HDL 62 07/25/2022 12:02 PM   CHOLHDL 2.2 07/25/2022 12:02 PM   LDLCALC 54 07/25/2022 12:02 PM    Wt Readings from Last 3 Encounters:  12/31/22 168 lb 3.2 oz (76.3 kg)  09/25/22 173 lb 6.4 oz (78.7 kg)  07/25/22 170 lb 6.4 oz (77.3 kg)     Exam:    Vital Signs:  There were no vitals taken for this visit.    Physical Exam Vitals and nursing note reviewed.  Constitutional:      Appearance: Normal appearance.  HENT:     Head: Normocephalic and atraumatic.  Pulmonary:     Effort: Pulmonary effort is normal.  Musculoskeletal:     Cervical back: Normal range of motion.  Neurological:     Mental Status: She is alert and oriented to person, place, and time.  Psychiatric:        Mood and Affect: Affect normal.     ASSESSMENT & PLAN:    Major depression, recurrent, chronic (HCC) Assessment & Plan: Chronic, thinks her sx would be better controlled with higher dose. I will increase sertraline to 50mg  daily. She agrees to f/u in 3-4 months for re-evaluation.    Gastroesophageal reflux disease without esophagitis Assessment & Plan: She has done well with pantoprazole daily. Reminded to stop eating 3 hrs prior to lying down and to avoid known triggers.    Other orders -     Sertraline  HCl; Take 1 tablet (50 mg total) by mouth daily.  Dispense: 90 tablet; Refill: 2       COVID-19 Education: The signs and symptoms of COVID-19 were discussed with the patient and how to seek care for testing (follow up with PCP or arrange E-visit).  The importance of social distancing was discussed today.  Patient Risk:   After full review of this patients clinical status, I feel that they are at least moderate risk at this time.  Time:   Today, I have spent 15 minutes/ seconds with the patient with telehealth technology discussing above diagnoses.     Medication Adjustments/Labs and Tests Ordered: Current medicines are reviewed at length with the patient today.  Concerns regarding medicines are outlined above.   Tests  Ordered: No orders of the defined types were placed in this encounter.   Medication Changes: Meds ordered this encounter  Medications   sertraline (ZOLOFT) 50 MG tablet    Sig: Take 1 tablet (50 mg total) by mouth daily.    Dispense:  90 tablet    Refill:  2    Disposition:  Follow up in 3 month(s)  Signed, Gwynneth Aliment, MD

## 2023-02-27 ENCOUNTER — Other Ambulatory Visit: Payer: Self-pay | Admitting: Cardiovascular Disease

## 2023-02-27 DIAGNOSIS — N280 Ischemia and infarction of kidney: Secondary | ICD-10-CM

## 2023-02-27 NOTE — Telephone Encounter (Signed)
Prescription refill request for Eliquis received. Indication: DVT, hx renal infarct Last office visit: 09/25/2022 Scr: 0.62, 12/31/2022 Age: 72 yo  Weight: 76.3 kg

## 2023-03-03 NOTE — Assessment & Plan Note (Signed)
She has done well with pantoprazole daily. Reminded to stop eating 3 hrs prior to lying down and to avoid known triggers.

## 2023-03-03 NOTE — Assessment & Plan Note (Signed)
Chronic, thinks her sx would be better controlled with higher dose. I will increase sertraline to 50mg  daily. She agrees to f/u in 3-4 months for re-evaluation.

## 2023-03-09 NOTE — Progress Notes (Unsigned)
Cardiology Office Note:   Date:  03/10/2023  NAME:  Debbie Barry    MRN: [id number] DOB:  1950-12-04   PCP:  Dorothyann Peng, MD  Cardiologist:  Reatha Harps, MD  Electrophysiologist:  None   Referring MD: Dorothyann Peng, MD   Chief Complaint  Patient presents with   Follow-up         History of Present Illness:   Debbie Barry is a 72 y.o. female with a hx of CHF, PMVT, non-obstructive CAD, DVT who presents for follow-up. BP a bit elevated today. Off coreg due to GI intolerance. SOB improving. No volume overload. Echo is stable.  She overall is doing well.  I encouraged her to get more active.  Her blood pressure is a bit up.  Back: Spironolactone at 12.5 mg daily.  Also on Entresto.  We discussed starting her back on metoprolol.  No signs of volume overload.  Overall seems to be doing well.  Problem List PMVT/Torsades/Cardiac Arrest -2/2 electrolyte derangements (Mg 0.8) 05/07/2021 2. Systolic HF with recovery  -EF 25-30% 05/2021 -EF 55-60% 08/2021 -2/2 critical illness -LHC 20% LAD 3. Non-obstructive CAD -20% LAD 05/2021 -T chol 138, HDL 62, LDL 54, TG 130 4. Severe protein deficiency  5.  Fatty liver 6.  Anasarca 7.  Severe spinal cord stenosis 8.  Acute right renal infarct 9. DVT  Past Medical History: Past Medical History:  Diagnosis Date   Fatty liver    GERD (gastroesophageal reflux disease)    Hypertension    Nausea and vomiting 03/31/2021   Osteomyelitis (HCC)    right third toe   Peripheral vascular disease (HCC)    Ulcer 08/14/20    Past Surgical History: Past Surgical History:  Procedure Laterality Date   AMPUTATION Right 08/08/2017   Procedure: AMPUTATION RIGHT 3RD TOE;  Surgeon: Nadara Mustard, MD;  Location: [facility] OR;  Service: Orthopedics;  Laterality: Right;   BIOPSY  08/14/2020   Procedure: BIOPSY;  Surgeon: Vida Rigger, MD;  Location: [facility] ENDOSCOPY;  Service: Endoscopy;;   Bunionectomy Right 2006     Right   Bunionectony Left  2006      August   COLONOSCOPY N/A 01/21/2014   Procedure: COLONOSCOPY;  Surgeon: Theda Belfast, MD;  Location: [facility];  Service: Endoscopy;  Laterality: N/A;   ESOPHAGOGASTRODUODENOSCOPY (EGD) WITH PROPOFOL N/A 08/14/2020   Procedure: ESOPHAGOGASTRODUODENOSCOPY (EGD) WITH PROPOFOL;  Surgeon: Vida Rigger, MD;  Location: [facility];  Service: Endoscopy;  Laterality: N/A;   IR THORACENTESIS ASP PLEURAL SPACE W/IMG GUIDE  05/03/2021   RIGHT/LEFT HEART CATH AND CORONARY ANGIOGRAPHY N/A 05/11/2021   Procedure: RIGHT/LEFT HEART CATH AND CORONARY ANGIOGRAPHY;  Surgeon: Lennette Bihari, MD;  Location: [facility];  Service: Cardiovascular;  Laterality: N/A;   TOTAL HIP ARTHROPLASTY Left 05/11/2022   TUBAL LIGATION     WISDOM TOOTH EXTRACTION      Current Medications: Current Meds  Medication Sig   acetaminophen (TYLENOL) 325 MG tablet Take 650 mg by mouth every 6 (six) hours as needed for moderate pain.   apixaban (ELIQUIS) 5 MG TABS tablet TAKE 1 TABLET(5 MG) BY MOUTH TWICE DAILY   bismuth subsalicylate (PEPTO BISMOL) 262 MG/15ML suspension Take 30 mLs by mouth every 6 (six) hours as needed for indigestion.   calcium carbonate (TUMS EX) 750 MG chewable tablet Chew 1 tablet by mouth daily as needed for heartburn.   Cholecalciferol (VITAMIN D3) 5000 units CAPS Take 5,000 Units by mouth daily.  Magnesium 500 MG TABS Take 1 tablet by mouth daily.   metoprolol succinate (TOPROL XL) 25 MG 24 hr tablet Take 1 tablet (25 mg total) by mouth daily.   Multiple Vitamin (MULTIVITAMIN WITH MINERALS) TABS tablet Take 1 tablet by mouth daily.   ondansetron (ZOFRAN ODT) 4 MG disintegrating tablet Take 1 tablet (4 mg total) by mouth every 8 (eight) hours as needed for nausea or vomiting.   pantoprazole (PROTONIX) 40 MG tablet Take 1 tablet (40 mg total) by mouth daily.   potassium chloride SA (KLOR-CON M) 20 MEQ tablet TAKE 1 TABLET(20 MEQ) BY MOUTH TWICE DAILY   sacubitril-valsartan (ENTRESTO)  24-26 MG Take 1 tablet by mouth 2 (two) times daily.   sertraline (ZOLOFT) 50 MG tablet Take 1 tablet (50 mg total) by mouth daily.   spironolactone (ALDACTONE) 25 MG tablet Take 0.5 tablets (12.5 mg total) by mouth daily.   sucralfate (CARAFATE) 1 g tablet TAKE 1 TABLET(1 GRAM) BY MOUTH TWICE DAILY prn   [DISCONTINUED] carvedilol (COREG) 12.5 MG tablet Take 1 tablet (12.5 mg total) by mouth 2 (two) times daily.     Allergies:    Carvedilol   Social History: Social History   Socioeconomic History   Marital status: Married    Spouse name: Not on file   Number of children: Not on file   Years of education: Not on file   Highest education level: Not on file  Occupational History   Not on file  Tobacco Use   Smoking status: Never   Smokeless tobacco: Never  Vaping Use   Vaping status: Never Used  Substance and Sexual Activity   Alcohol use: Not Currently    Comment: social glass of wine   Drug use: No   Sexual activity: Yes    Birth control/protection: Post-menopausal  Other Topics Concern   Not on file  Social History Narrative   Not on file   Social Determinants of Health   Financial Resource Strain: Low Risk  (06/13/2022)   Overall Financial Resource Strain (CARDIA)    Difficulty of Paying Living Expenses: Not hard at all  Food Insecurity: No Food Insecurity (06/13/2022)   Hunger Vital Sign    Worried About Running Out of Food in the Last Year: Never true    Ran Out of Food in the Last Year: Never true  Transportation Needs: No Transportation Needs (06/13/2022)   PRAPARE - Administrator, Civil Service (Medical): No    Lack of Transportation (Non-Medical): No  Physical Activity: Inactive (06/13/2022)   Exercise Vital Sign    Days of Exercise per Week: 0 days    Minutes of Exercise per Session: 0 min  Stress: No Stress Concern Present (06/13/2022)   [employer] of Occupational Health - Occupational Stress Questionnaire    Feeling of Stress : Only  a little  Social Connections: Socially Integrated (05/10/2022)   Received from [facility], [facility]   Social Connection and Isolation Panel [NHANES]    Frequency of Communication with Friends and Family: More than three times a week    Frequency of Social Gatherings with Friends and Family: More than three times a week    Attends Religious Services: More than 4 times per year    Active Member of [employer] or Organizations: Yes    Attends Engineer, structural: More than 4 times per year    Marital Status: Married     Family History: The patient's family history includes Arthritis in  her father and mother; Breast cancer in her mother; Cancer in her father and mother; Colon cancer in her mother; Heart Problems in her mother; Heart disease in her father and mother; Hypertension in her father and mother; Multiple myeloma in her father and mother.  ROS:   All other ROS reviewed and negative. Pertinent positives noted in the HPI.     EKGs/Labs/Other Studies Reviewed:   The following studies were personally reviewed by me today:  EKG:  EKG is not ordered today.        TTE 10/21/2022  1. Left ventricular ejection fraction, by estimation, is 55 to 60%. Left  ventricular ejection fraction by 3D volume is 55 %. The left ventricle has  normal function. The left ventricle has no regional wall motion  abnormalities. Left ventricular diastolic   parameters are consistent with Grade I diastolic dysfunction (impaired  relaxation).   2. Right ventricular systolic function is normal. The right ventricular  size is normal.   3. The mitral valve is normal in structure. Mild mitral valve  regurgitation.   4. The aortic valve is tricuspid. Aortic valve regurgitation is not  visualized. Aortic valve sclerosis/calcification is present, without any  evidence of aortic stenosis.   5. The inferior vena cava is normal in size with greater than 50%  respiratory variability, suggesting right  atrial pressure of 3 mmHg.   Recent Labs: 07/25/2022: BNP 118.3; TSH 2.700 12/31/2022: ALT 35; BUN 4; Creatinine, Ser 0.62; Hemoglobin 16.0; Magnesium 1.9; Platelets 147; Potassium 4.5; Sodium 143   Recent Lipid Panel    Component Value Date/Time   CHOL 138 07/25/2022 1202   TRIG 130 07/25/2022 1202   HDL 62 07/25/2022 1202   CHOLHDL 2.2 07/25/2022 1202   LDLCALC 54 07/25/2022 1202    Physical Exam:   VS:  BP (!) 140/110   Pulse 89   Ht 5\' 1"  (1.549 m)   Wt 174 lb 6.4 oz (79.1 kg)   SpO2 100%   BMI 32.95 kg/m    Wt Readings from Last 3 Encounters:  03/10/23 174 lb 6.4 oz (79.1 kg)  12/31/22 168 lb 3.2 oz (76.3 kg)  09/25/22 173 lb 6.4 oz (78.7 kg)    General: Well nourished, well developed, in no acute distress Head: Atraumatic, normal size  Eyes: PEERLA, EOMI  Neck: Supple, no JVD Endocrine: No thryomegaly Cardiac: Normal S1, S2; RRR; no murmurs, rubs, or gallops Lungs: Clear to auscultation bilaterally, no wheezing, rhonchi or rales  Abd: Soft, nontender, no hepatomegaly  Ext: No edema, pulses 2+ Musculoskeletal: No deformities, BUE and BLE strength normal and equal Skin: Warm and dry, no rashes   Neuro: Alert and oriented to person, place, time, and situation, CNII-XII grossly intact, no focal deficits  Psych: Normal mood and affect   ASSESSMENT:   Jannis Dazzo is a 72 y.o. female who presents for the following: 1. Chronic systolic heart failure (HCC)   2. Primary hypertension   3. Coronary artery disease involving native coronary artery of native heart without angina pectoris     PLAN:   1. Chronic systolic heart failure (HCC) 2. Primary hypertension -Recovered ejection fraction.  No signs of volume overload.  Blood pressure is a bit elevated.  Reports intolerance to carvedilol.  Transition to metoprolol succinate 25 mg daily.  Continue Entresto 24-26 mg twice daily.  She reports having issues with Aldactone at 25 mg, she will continue 12.5 mg  daily. -Torsemide as needed. -Return to clinic in 6  months.  3. Coronary artery disease involving native coronary artery of native heart without angina pectoris -Nonobstructive CAD.  Not on aspirin as she is on Eliquis.  She has history of DVT.  Most recent LDL very well-controlled.  Disposition: Return in about 6 months (around 09/07/2023).  Medication Adjustments/Labs and Tests Ordered: Current medicines are reviewed at length with the patient today.  Concerns regarding medicines are outlined above.  No orders of the defined types were placed in this encounter.  Meds ordered this encounter  Medications   torsemide (DEMADEX) 20 MG tablet    Sig: Take 1 tablet (20 mg total) by mouth daily as needed (swelling).    Dispense:  20 tablet    Refill:  1   metoprolol succinate (TOPROL XL) 25 MG 24 hr tablet    Sig: Take 1 tablet (25 mg total) by mouth daily.    Dispense:  90 tablet    Refill:  3   Patient Instructions  Medication Instructions:  Your physician has recommended you make the following change in your medication:  START: Metoprolol succinate (Toprol-XL) 25 mg once daily  *If you need a refill on your cardiac medications before your next appointment, please call your pharmacy*   Lab Work: None   Testing/Procedures: None   Follow-Up: At [facility], you and your health needs are our priority.  As part of our continuing mission to provide you with exceptional heart care, we have created designated Provider Care Teams.  These Care Teams include your primary Cardiologist (physician) and Advanced Practice Providers (APPs -  Physician Assistants and Nurse Practitioners) who all work together to provide you with the care you need, when you need it.  Your next appointment:   6 month(s)  Provider:   Edd Fabian, FNP, Micah Flesher, PA-C, Marjie Skiff, PA-C, Robet Leu, PA-C, Juanda Crumble, PA-C, Joni Reining, DNP, ANP, Azalee Course, PA-C, Bernadene Person, NP,  or Carlos Levering, NP    Then, Reatha Harps, MD will plan to see you again in 12 month(s)    Time Spent with Patient: I have spent a total of 25 minutes with patient reviewing hospital notes, telemetry, EKGs, labs and examining the patient as well as establishing an assessment and plan that was discussed with the patient.  > 50% of time was spent in direct patient care.  Signed, Lenna Gilford. Flora Lipps, MD, [facility]  [facility]  [address] [place], [place] [address] [phone]  03/10/2023 6:22 PM

## 2023-03-10 ENCOUNTER — Encounter: Payer: Self-pay | Admitting: Cardiovascular Disease

## 2023-03-10 ENCOUNTER — Ambulatory Visit: Payer: Medicare HMO | Attending: Cardiovascular Disease | Admitting: Cardiovascular Disease

## 2023-03-10 VITALS — BP 140/110 | HR 89 | Ht 61.0 in | Wt 174.4 lb

## 2023-03-10 DIAGNOSIS — I10 Essential (primary) hypertension: Secondary | ICD-10-CM | POA: Diagnosis not present

## 2023-03-10 DIAGNOSIS — I5022 Chronic systolic (congestive) heart failure: Secondary | ICD-10-CM | POA: Diagnosis not present

## 2023-03-10 DIAGNOSIS — I2510 Atherosclerotic heart disease of native coronary artery without angina pectoris: Secondary | ICD-10-CM | POA: Diagnosis not present

## 2023-03-10 MED ORDER — TORSEMIDE 20 MG PO TABS: 20.0000 mg | ORAL_TABLET | Freq: Every day | ORAL | 1 refills | Status: AC | PRN

## 2023-03-10 MED ORDER — METOPROLOL SUCCINATE ER 25 MG PO TB24: 25.0000 mg | ORAL_TABLET | Freq: Every day | ORAL | 3 refills | Status: DC

## 2023-03-10 NOTE — Patient Instructions (Addendum)
Medication Instructions:  Your physician has recommended you make the following change in your medication:  START: Metoprolol succinate (Toprol-XL) 25 mg once daily  *If you need a refill on your cardiac medications before your next appointment, please call your pharmacy*   Lab Work: None   Testing/Procedures: None   Follow-Up: At [facility], you and your health needs are our priority.  As part of our continuing mission to provide you with exceptional heart care, we have created designated Provider Care Teams.  These Care Teams include your primary Cardiologist (physician) and Advanced Practice Providers (APPs -  Physician Assistants and Nurse Practitioners) who all work together to provide you with the care you need, when you need it.  Your next appointment:   6 month(s)  Provider:   Edd Fabian, FNP, Micah Flesher, PA-C, Marjie Skiff, PA-C, Robet Leu, PA-C, Juanda Crumble, PA-C, Joni Reining, DNP, ANP, Azalee Course, PA-C, Bernadene Person, NP, or Carlos Levering, NP    Then, Reatha Harps, MD will plan to see you again in 12 month(s)

## 2023-03-19 ENCOUNTER — Other Ambulatory Visit: Payer: Medicare HMO | Admitting: Pharmacist

## 2023-03-20 ENCOUNTER — Telehealth: Payer: Self-pay | Admitting: Pharmacist

## 2023-03-20 ENCOUNTER — Other Ambulatory Visit: Payer: Medicare HMO | Admitting: Pharmacist

## 2023-03-20 NOTE — Progress Notes (Unsigned)
03/20/2023 Name: Debbie Barry MRN: [id number] DOB: [birth year]  No chief complaint on file.   Debbie Barry is a 72 y.o. year old female who presented for a telephone visit.   They were referred to the pharmacist by their PCP for assistance in managing complex medication management.    Subjective:  Care Team: Primary Care Provider: Dorothyann Peng, MD ; Next Scheduled Visit: *** {careteamprovider:27366}  Medication Access/Adherence  Current Pharmacy:  [facility] [phone] Ginette Otto, [place] - [address] AT [facility] [address] [facility] [address]  [place] [address] Phone: [phone] Fax: [phone]  [facility] - [place], [place] [address] [place] [address] Phone: [phone] Fax: [phone]  [facility] [address] [place] [address] Phone: [phone] Fax: [phone]   Patient reports affordability concerns with their medications: {YES/NO:21197} Patient reports access/transportation concerns to their pharmacy: {YES/NO:21197} Patient reports adherence concerns with their medications:  {YES/NO:21197} ***   Hyperlipidemia/ASCVD Risk Reduction  Current lipid lowering medications:  Medications tried in the past:   Antiplatelet regimen:   ASCVD History:  Family History:  Risk Factors:   Current physical activity: ***  Current medication access support: ***  @[email or web address] @  PREVENT Risk Score: 10 year risk of CVD: *** - 10 year risk of ASCVD: *** - 10 year risk of HF: ***  Heart Failure:  Current medications:  ACEi/ARB/ARNI: Entresto 24/26 mg twice daily,  SGLT2i: *** Beta blocker: metoprolol succinate 25 mg daily Mineralocorticoid Receptor Antagonist: spironolacton 12.5 mg daily Diuretic regimen: torsemide 20 mg daily prn  Current home blood pressure readings: *** Current home weights:  ***  Patient {Actions; denies-reports:120008} volume overload signs or symptoms including ***shortness of breath, lower extremity edema, increased use of pillows at night   Current physical activity: ***  Current medication access support: ***   Objective:  Lab Results  Component Value Date   HGBA1C 4.8 07/25/2022    Lab Results  Component Value Date   CREATININE 0.62 12/31/2022   BUN 4 (L) 12/31/2022   NA 143 12/31/2022   K 4.5 12/31/2022   CL 101 12/31/2022   CO2 21 12/31/2022    Lab Results  Component Value Date   CHOL 138 07/25/2022   HDL 62 07/25/2022   LDLCALC 54 07/25/2022   TRIG 130 07/25/2022   CHOLHDL 2.2 07/25/2022    Medications Reviewed Today   Medications were not reviewed in this encounter       Assessment/Plan:   {Pharmacy A/P Choices:26421}  Follow Up Plan: ***  ***

## 2023-03-20 NOTE — Progress Notes (Signed)
Attempted to contact patient for scheduled appointment for medication management. Left HIPAA compliant message for patient to return my call at their convenience.   Will send MyChart.  Catie T. Harper, PharmD, BCACP, CPP Clinical Pharmacist [facility] [phone]  

## 2023-03-27 ENCOUNTER — Telehealth: Payer: Self-pay

## 2023-03-27 NOTE — Progress Notes (Unsigned)
Care Coordination Note  03/27/2023 Name: Debbie Barry MRN: [id number] DOB: July 14, 1950  Debbie Barry is a 72 y.o. year old female who is a primary care patient of Dorothyann Peng, MD and is actively engaged with the Chronic Care Management team. I reached out to [name] by phone today to assist with re-scheduling a follow up visit with the Pharmacist  Follow up plan: Unsuccessful telephone outreach attempt made. A HIPAA compliant phone message was left for the patient providing contact information and requesting a return call.  If patient returns call to provider office, please advise to call CCM Care Guide Penne Lash  at [phone]  Penne Lash, RMA Care Guide [facility]  [place], [place] [address] Direct Dial: [phone] [email or web address]

## 2023-04-04 NOTE — Progress Notes (Unsigned)
Care Coordination Note  04/04/2023 Name: Debbie Barry MRN: [id number] DOB: 16-Feb-1951  Debbie Barry is a 72 y.o. year old female who is a primary care patient of Dorothyann Peng, MD and is actively engaged with the Chronic Care Management team. I reached out to Debbie Barry by phone today to assist with re-scheduling a follow up visit with the Pharmacist  Follow up plan: Unsuccessful telephone outreach attempt made. A HIPAA compliant phone message was left for the patient providing contact information and requesting a return call.  If patient returns call to provider office, please advise to call CCM Care Guide Debbie Barry  at [phone]  Debbie Barry, RMA Care Guide [facility]  [place], [place] [address] Direct Dial: [phone] [email or web address]

## 2023-04-05 ENCOUNTER — Observation Stay (HOSPITAL_COMMUNITY)
Admission: EM | Admit: 2023-04-05 | Discharge: 2023-04-07 | Disposition: A | Discharge status: Home Health | Payer: Medicare HMO | Attending: Internal Medicine | Admitting: Internal Medicine

## 2023-04-05 ENCOUNTER — Emergency Department (HOSPITAL_COMMUNITY): Payer: Medicare HMO

## 2023-04-05 ENCOUNTER — Encounter (HOSPITAL_COMMUNITY): Payer: Self-pay | Admitting: Emergency Medicine

## 2023-04-05 ENCOUNTER — Other Ambulatory Visit: Payer: Self-pay

## 2023-04-05 DIAGNOSIS — M25462 Effusion, left knee: Secondary | ICD-10-CM | POA: Diagnosis not present

## 2023-04-05 DIAGNOSIS — M85862 Other specified disorders of bone density and structure, left lower leg: Secondary | ICD-10-CM | POA: Diagnosis not present

## 2023-04-05 DIAGNOSIS — S199XXA Unspecified injury of neck, initial encounter: Secondary | ICD-10-CM | POA: Diagnosis not present

## 2023-04-05 DIAGNOSIS — Z89421 Acquired absence of other right toe(s): Secondary | ICD-10-CM | POA: Diagnosis not present

## 2023-04-05 DIAGNOSIS — I2510 Atherosclerotic heart disease of native coronary artery without angina pectoris: Secondary | ICD-10-CM | POA: Insufficient documentation

## 2023-04-05 DIAGNOSIS — W19XXXA Unspecified fall, initial encounter: Secondary | ICD-10-CM

## 2023-04-05 DIAGNOSIS — F339 Major depressive disorder, recurrent, unspecified: Secondary | ICD-10-CM | POA: Diagnosis present

## 2023-04-05 DIAGNOSIS — W010XXA Fall on same level from slipping, tripping and stumbling without subsequent striking against object, initial encounter: Secondary | ICD-10-CM | POA: Insufficient documentation

## 2023-04-05 DIAGNOSIS — R609 Edema, unspecified: Secondary | ICD-10-CM | POA: Diagnosis not present

## 2023-04-05 DIAGNOSIS — S82142A Displaced bicondylar fracture of left tibia, initial encounter for closed fracture: Principal | ICD-10-CM | POA: Diagnosis present

## 2023-04-05 DIAGNOSIS — M810 Age-related osteoporosis without current pathological fracture: Secondary | ICD-10-CM | POA: Diagnosis not present

## 2023-04-05 DIAGNOSIS — Z86718 Personal history of other venous thrombosis and embolism: Secondary | ICD-10-CM | POA: Insufficient documentation

## 2023-04-05 DIAGNOSIS — K2970 Gastritis, unspecified, without bleeding: Secondary | ICD-10-CM | POA: Diagnosis not present

## 2023-04-05 DIAGNOSIS — Z7409 Other reduced mobility: Secondary | ICD-10-CM | POA: Diagnosis not present

## 2023-04-05 DIAGNOSIS — S82202A Unspecified fracture of shaft of left tibia, initial encounter for closed fracture: Secondary | ICD-10-CM | POA: Diagnosis not present

## 2023-04-05 DIAGNOSIS — Z79899 Other long term (current) drug therapy: Secondary | ICD-10-CM | POA: Diagnosis not present

## 2023-04-05 DIAGNOSIS — S0990XA Unspecified injury of head, initial encounter: Secondary | ICD-10-CM | POA: Diagnosis not present

## 2023-04-05 DIAGNOSIS — I82409 Acute embolism and thrombosis of unspecified deep veins of unspecified lower extremity: Secondary | ICD-10-CM | POA: Diagnosis present

## 2023-04-05 DIAGNOSIS — Y92000 Kitchen of unspecified non-institutional (private) residence as  the place of occurrence of the external cause: Secondary | ICD-10-CM | POA: Diagnosis not present

## 2023-04-05 DIAGNOSIS — Z7901 Long term (current) use of anticoagulants: Secondary | ICD-10-CM | POA: Diagnosis not present

## 2023-04-05 DIAGNOSIS — R339 Retention of urine, unspecified: Secondary | ICD-10-CM | POA: Diagnosis present

## 2023-04-05 DIAGNOSIS — I110 Hypertensive heart disease with heart failure: Secondary | ICD-10-CM | POA: Insufficient documentation

## 2023-04-05 DIAGNOSIS — M25562 Pain in left knee: Secondary | ICD-10-CM | POA: Diagnosis not present

## 2023-04-05 DIAGNOSIS — S82145A Nondisplaced bicondylar fracture of left tibia, initial encounter for closed fracture: Secondary | ICD-10-CM | POA: Diagnosis not present

## 2023-04-05 DIAGNOSIS — G4489 Other headache syndrome: Secondary | ICD-10-CM | POA: Diagnosis not present

## 2023-04-05 DIAGNOSIS — Z96642 Presence of left artificial hip joint: Secondary | ICD-10-CM | POA: Insufficient documentation

## 2023-04-05 DIAGNOSIS — Z23 Encounter for immunization: Secondary | ICD-10-CM | POA: Diagnosis not present

## 2023-04-05 DIAGNOSIS — Z043 Encounter for examination and observation following other accident: Secondary | ICD-10-CM | POA: Diagnosis not present

## 2023-04-05 DIAGNOSIS — I5022 Chronic systolic (congestive) heart failure: Secondary | ICD-10-CM | POA: Diagnosis not present

## 2023-04-05 DIAGNOSIS — R9089 Other abnormal findings on diagnostic imaging of central nervous system: Secondary | ICD-10-CM | POA: Diagnosis not present

## 2023-04-05 DIAGNOSIS — R42 Dizziness and giddiness: Secondary | ICD-10-CM | POA: Diagnosis not present

## 2023-04-05 DIAGNOSIS — S8392XA Sprain of unspecified site of left knee, initial encounter: Secondary | ICD-10-CM | POA: Diagnosis not present

## 2023-04-05 DIAGNOSIS — I10 Essential (primary) hypertension: Secondary | ICD-10-CM | POA: Diagnosis present

## 2023-04-05 MED ORDER — HYDROCODONE-ACETAMINOPHEN 5-325 MG PO TABS
1.0000 | ORAL_TABLET | Freq: Once | ORAL | Status: AC
  Administered 2023-04-05: 1 via ORAL
  Filled 2023-04-05: qty 1

## 2023-04-05 NOTE — ED Provider Notes (Signed)
[place] EMERGENCY DEPARTMENT AT [facility] Provider Note   CSN: [id number] Arrival date & time: 04/05/23  1947     History {Add pertinent medical, surgical, social history, OB history to HPI:1} Chief Complaint  Patient presents with   Fall   Knee Pain   HPI Debbie Barry is a 72 y.o. female with history of DVT on Eliquis, hypertension, CHF presenting for fall and knee pain.  States the fall occurred yesterday afternoon.  Patient was walking around her kitchen when she tripped and fell landing on her left side.  Now with left knee pain and some mild left hip pain.  Took a reduced dose of her Eliquis last night due to the fall.  Now having difficulty bearing weight on the left side along with difficulty with ambulation which prompted her call to EMS and evaluation here today.  Denies chest pain shortness of breath.  Endorses some mild swelling around the left knee.  Also states she may have hit her head as well but denies loss of consciousness.  States her head hurts somewhat but denies neck pain.  States she can normally walk at home without assistance but does use a cane from time to time.   Fall  Knee Pain      Home Medications Prior to Admission medications   Medication Sig Start Date End Date Taking? Authorizing Provider  acetaminophen (TYLENOL) 325 MG tablet Take 650 mg by mouth every 6 (six) hours as needed for moderate pain.    [provider]  apixaban (ELIQUIS) 5 MG TABS tablet TAKE 1 TABLET(5 MG) BY MOUTH TWICE DAILY 02/27/23   O'Neal, Ronnald Ramp, MD  bismuth subsalicylate (PEPTO BISMOL) 262 MG/15ML suspension Take 30 mLs by mouth every 6 (six) hours as needed for indigestion.    [provider]  calcium carbonate (TUMS EX) 750 MG chewable tablet Chew 1 tablet by mouth daily as needed for heartburn.    [provider]  Cholecalciferol (VITAMIN D3) 5000 units CAPS Take 5,000 Units by mouth daily.    [provider]   Magnesium 500 MG TABS Take 1 tablet by mouth daily.    [provider]  metoprolol succinate (TOPROL XL) 25 MG 24 hr tablet Take 1 tablet (25 mg total) by mouth daily. 03/10/23   O'NealRonnald Ramp, MD  Multiple Vitamin (MULTIVITAMIN WITH MINERALS) TABS tablet Take 1 tablet by mouth daily.    [provider]  ondansetron (ZOFRAN ODT) 4 MG disintegrating tablet Take 1 tablet (4 mg total) by mouth every 8 (eight) hours as needed for nausea or vomiting. 04/04/21   Narda Bonds, MD  pantoprazole (PROTONIX) 40 MG tablet Take 1 tablet (40 mg total) by mouth daily. 12/31/22   Dorothyann Peng, MD  potassium chloride SA (KLOR-CON M) 20 MEQ tablet TAKE 1 TABLET(20 MEQ) BY MOUTH TWICE DAILY 09/20/22   O'Neal, Ronnald Ramp, MD  sacubitril-valsartan (ENTRESTO) 24-26 MG Take 1 tablet by mouth 2 (two) times daily. 09/25/22   Sande Rives, MD  sertraline (ZOLOFT) 50 MG tablet Take 1 tablet (50 mg total) by mouth daily. 02/26/23 02/26/24  Dorothyann Peng, MD  spironolactone (ALDACTONE) 25 MG tablet Take 0.5 tablets (12.5 mg total) by mouth daily. 09/25/22 09/20/23  Sande Rives, MD  sucralfate (CARAFATE) 1 g tablet TAKE 1 TABLET(1 GRAM) BY MOUTH TWICE DAILY prn 12/31/22   Dorothyann Peng, MD  torsemide (DEMADEX) 20 MG tablet Take 1 tablet (20 mg total) by mouth daily as  needed (swelling). 03/10/23 06/08/23  O'NealRonnald Ramp, MD      Allergies    Carvedilol    Review of Systems   See HPI for pertinent positives   Physical Exam   Vitals:   04/05/23 1948 04/05/23 2000  BP:  (!) 143/112  Pulse:  100  Resp:  18  Temp:  99.6 F (37.6 C)  SpO2: 95% 99%    CONSTITUTIONAL:  well-appearing, NAD NEURO:  GCS 15. Speech is goal oriented. No deficits appreciated to CN III-XII; symmetric eyebrow raise, no facial drooping, tongue midline. Patient has equal grip strength bilaterally with 5/5 strength against resistance in all major muscle groups bilaterally. Sensation to light touch  intact. Patient moves extremities without ataxia. Normal finger-nose-finger. Did not assess gait.   EYES:  eyes equal and reactive ENT/NECK:  Supple, no stridor  CARDIO:  Regular rate and rhythm, appears well-perfused  PULM:  No respiratory distress, CTAB GI/GU:  non-distended, soft MSK/SPINE:  No gross deformities, no edema, moves all extremities  SKIN:  no rash, atraumatic  *Additional and/or pertinent findings included in MDM below  ED Results / Procedures / Treatments   Labs (all labs ordered are listed, but only abnormal results are displayed) Labs Reviewed - No data to display  EKG None  Radiology No results found.  Procedures Procedures  {Document cardiac monitor, telemetry assessment procedure when appropriate:1}  Medications Ordered in ED Medications  HYDROcodone-acetaminophen (NORCO/VICODIN) 5-325 MG per tablet 1 tablet (has no administration in time range)    ED Course/ Medical Decision Making/ A&P   {   Click here for ABCD2, HEART and other calculatorsREFRESH Note before signing :1}                              Medical Decision Making Amount and/or Complexity of Data Reviewed Radiology: ordered.   ***  {Document critical care time when appropriate:1} {Document review of labs and clinical decision tools ie heart score, Chads2Vasc2 etc:1}  {Document your independent review of radiology images, and any outside records:1} {Document your discussion with family members, caretakers, and with consultants:1} {Document social determinants of health affecting pt's care:1} {Document your decision making why or why not admission, treatments were needed:1} Final Clinical Impression(s) / ED Diagnoses Final diagnoses:  None    Rx / DC Orders ED Discharge Orders     None

## 2023-04-05 NOTE — ED Triage Notes (Addendum)
Patient BIB EMS for evaluation s/p fall yesterday.  Reports she fell around 3 PM yesterday.  C/o pain to L knee.  States pain was mild and has become more severe.  No reports of LOC.  Has small abrasion to L forehead.  Does take Eliquis and states she took a reduced dose last night due to fall.  Currently alert and oriented.  Difficulty with ambulation

## 2023-04-06 ENCOUNTER — Observation Stay (HOSPITAL_COMMUNITY): Payer: Medicare HMO

## 2023-04-06 ENCOUNTER — Emergency Department (HOSPITAL_COMMUNITY): Payer: Medicare HMO

## 2023-04-06 DIAGNOSIS — S82142A Displaced bicondylar fracture of left tibia, initial encounter for closed fracture: Secondary | ICD-10-CM | POA: Diagnosis not present

## 2023-04-06 DIAGNOSIS — M85862 Other specified disorders of bone density and structure, left lower leg: Secondary | ICD-10-CM | POA: Diagnosis not present

## 2023-04-06 DIAGNOSIS — I2510 Atherosclerotic heart disease of native coronary artery without angina pectoris: Secondary | ICD-10-CM

## 2023-04-06 DIAGNOSIS — S82145A Nondisplaced bicondylar fracture of left tibia, initial encounter for closed fracture: Secondary | ICD-10-CM | POA: Diagnosis not present

## 2023-04-06 DIAGNOSIS — M25462 Effusion, left knee: Secondary | ICD-10-CM | POA: Diagnosis not present

## 2023-04-06 DIAGNOSIS — M79662 Pain in left lower leg: Secondary | ICD-10-CM | POA: Diagnosis not present

## 2023-04-06 DIAGNOSIS — S8392XA Sprain of unspecified site of left knee, initial encounter: Secondary | ICD-10-CM | POA: Diagnosis not present

## 2023-04-06 DIAGNOSIS — S82292A Other fracture of shaft of left tibia, initial encounter for closed fracture: Secondary | ICD-10-CM | POA: Diagnosis not present

## 2023-04-06 LAB — CBC WITH DIFFERENTIAL/PLATELET
Abs Immature Granulocytes: 0.02 K/uL (ref 0.00–0.07)
Basophils Absolute: 0 K/uL (ref 0.0–0.1)
Basophils Relative: 0 %
Eosinophils Absolute: 0.1 K/uL (ref 0.0–0.5)
Eosinophils Relative: 1 %
HCT: 39.7 % (ref 36.0–46.0)
Hemoglobin: 13.1 g/dL (ref 12.0–15.0)
Immature Granulocytes: 0 %
Lymphocytes Relative: 17 %
Lymphs Abs: 1 K/uL (ref 0.7–4.0)
MCH: 33.2 pg (ref 26.0–34.0)
MCHC: 33 g/dL (ref 30.0–36.0)
MCV: 100.8 fL — ABNORMAL HIGH (ref 80.0–100.0)
Monocytes Absolute: 0.4 K/uL (ref 0.1–1.0)
Monocytes Relative: 7 %
Neutro Abs: 4.2 K/uL (ref 1.7–7.7)
Neutrophils Relative %: 75 %
Platelets: 145 K/uL — ABNORMAL LOW (ref 150–400)
RBC: 3.94 MIL/uL (ref 3.87–5.11)
RDW: 14 % (ref 11.5–15.5)
WBC: 5.6 K/uL (ref 4.0–10.5)
nRBC: 0 % (ref 0.0–0.2)

## 2023-04-06 LAB — URINALYSIS, W/ REFLEX TO CULTURE (INFECTION SUSPECTED)
Bacteria, UA: NONE SEEN
Bilirubin Urine: NEGATIVE
Glucose, UA: NEGATIVE mg/dL
Hgb urine dipstick: NEGATIVE
Ketones, ur: 5 mg/dL — AB
Leukocytes,Ua: NEGATIVE
Nitrite: NEGATIVE
Protein, ur: NEGATIVE mg/dL
Specific Gravity, Urine: 1.015 (ref 1.005–1.030)
pH: 6 (ref 5.0–8.0)

## 2023-04-06 LAB — BASIC METABOLIC PANEL WITH GFR
Anion gap: 7 (ref 5–15)
BUN: <5 mg/dL — ABNORMAL LOW (ref 8–23)
CO2: 28 mmol/L (ref 22–32)
Calcium: 8 mg/dL — ABNORMAL LOW (ref 8.9–10.3)
Chloride: 104 mmol/L (ref 98–111)
Creatinine, Ser: 0.65 mg/dL (ref 0.44–1.00)
GFR, Estimated: >60 mL/min
Glucose, Bld: 83 mg/dL (ref 70–99)
Potassium: 3.5 mmol/L (ref 3.5–5.1)
Sodium: 139 mmol/L (ref 135–145)

## 2023-04-06 LAB — HIV ANTIBODY (ROUTINE TESTING W REFLEX): HIV Screen 4th Generation wRfx: NONREACTIVE

## 2023-04-06 LAB — CBG MONITORING, ED
Glucose-Capillary: 84 mg/dL (ref 70–99)
Glucose-Capillary: 98 mg/dL (ref 70–99)
Glucose-Capillary: 93 mg/dL (ref 70–99)
Glucose-Capillary: 90 mg/dL (ref 70–99)

## 2023-04-06 MED ORDER — HYDROCODONE-ACETAMINOPHEN 5-325 MG PO TABS
1.0000 | ORAL_TABLET | Freq: Four times a day (QID) | ORAL | Status: DC | PRN
  Administered 2023-04-06 – 2023-04-07 (×4): 1 via ORAL
  Filled 2023-04-06 (×4): qty 1

## 2023-04-06 MED ORDER — SERTRALINE HCL 50 MG PO TABS
50.0000 mg | ORAL_TABLET | Freq: Every day | ORAL | Status: DC
  Administered 2023-04-06 – 2023-04-07 (×2): 50 mg via ORAL
  Filled 2023-04-06 (×2): qty 1

## 2023-04-06 MED ORDER — METOPROLOL SUCCINATE ER 25 MG PO TB24
25.0000 mg | ORAL_TABLET | Freq: Every day | ORAL | Status: DC
  Administered 2023-04-06 – 2023-04-07 (×2): 25 mg via ORAL
  Filled 2023-04-06 (×2): qty 1

## 2023-04-06 MED ORDER — SODIUM CHLORIDE 0.9 % IV SOLN: 250.0000 mL | INTRAVENOUS | Status: DC | PRN

## 2023-04-06 MED ORDER — APIXABAN 5 MG PO TABS
5.0000 mg | ORAL_TABLET | Freq: Two times a day (BID) | ORAL | Status: DC
  Administered 2023-04-06 – 2023-04-07 (×3): 5 mg via ORAL
  Filled 2023-04-06 (×3): qty 1

## 2023-04-06 MED ORDER — HYDROCODONE-ACETAMINOPHEN 5-325 MG PO TABS
1.0000 | ORAL_TABLET | Freq: Once | ORAL | Status: AC
  Administered 2023-04-06: 1 via ORAL
  Filled 2023-04-06: qty 1

## 2023-04-06 MED ORDER — SPIRONOLACTONE 12.5 MG HALF TABLET
12.5000 mg | ORAL_TABLET | Freq: Every day | ORAL | Status: DC
  Administered 2023-04-06 – 2023-04-07 (×2): 12.5 mg via ORAL
  Filled 2023-04-06 (×2): qty 1

## 2023-04-06 MED ORDER — SODIUM CHLORIDE 0.9% FLUSH: 3.0000 mL | INTRAVENOUS | Status: DC | PRN

## 2023-04-06 MED ORDER — SACUBITRIL-VALSARTAN 24-26 MG PO TABS
1.0000 | ORAL_TABLET | Freq: Two times a day (BID) | ORAL | Status: DC
  Administered 2023-04-06 – 2023-04-07 (×3): 1 via ORAL
  Filled 2023-04-06 (×4): qty 1

## 2023-04-06 MED ORDER — SODIUM CHLORIDE 0.9% FLUSH: 3.0000 mL | Freq: Two times a day (BID) | INTRAVENOUS | Status: DC

## 2023-04-06 MED ORDER — INFLUENZA VAC A&B SURF ANT ADJ 0.5 ML IM SUSY
0.5000 mL | PREFILLED_SYRINGE | INTRAMUSCULAR | Status: AC
  Administered 2023-04-07: 0.5 mL via INTRAMUSCULAR
  Filled 2023-04-06: qty 0.5

## 2023-04-06 MED ORDER — PANTOPRAZOLE SODIUM 40 MG PO TBEC
40.0000 mg | DELAYED_RELEASE_TABLET | Freq: Every day | ORAL | Status: DC
  Administered 2023-04-06 – 2023-04-07 (×2): 40 mg via ORAL
  Filled 2023-04-06 (×2): qty 1

## 2023-04-06 MED ORDER — SUCRALFATE 1 G PO TABS: 1.0000 g | ORAL_TABLET | Freq: Two times a day (BID) | ORAL | Status: DC | PRN

## 2023-04-06 NOTE — ED Notes (Signed)
ED TO INPATIENT HANDOFF REPORT  ED Nurse Name and Phone #: Vernona Rieger [phone]  S Name/Age/Gender Debbie Barry 72 y.o. female Room/Bed: 038C/038C  Code Status   Code Status: Full Code  Home/SNF/Other Home Patient oriented to: self, place, time, and situation Is this baseline? Yes   Triage Complete: Triage complete  Chief Complaint Left medial tibial plateau fracture [S82.132A]  Triage Note Patient BIB EMS for evaluation s/p fall yesterday.  Reports she fell around 3 PM yesterday.  C/o pain to L knee.  States pain was mild and has become more severe.  No reports of LOC.  Has small abrasion to L forehead.  Does take Eliquis and states she took a reduced dose last night due to fall.  Currently alert and oriented.  Difficulty with ambulation     Allergies Allergies  Allergen Reactions   Carvedilol Diarrhea    Level of Care/Admitting Diagnosis ED Disposition     ED Disposition  Admit   Condition  --   Comment  Hospital Area: [facility] [[id number]]  Level of Care: Med-Surg [16]  May place patient in observation at [facility] or [facility] if equivalent level of care is available:: Yes  Covid Evaluation: Asymptomatic - no recent exposure (last 10 days) testing not required  Diagnosis: Left medial tibial plateau fracture [[id number]]  Admitting Physician: Orland Mustard [[id number]]  Attending Physician: Alton Revere          B Medical/Surgery History Past Medical History:  Diagnosis Date   Fatty liver    GERD (gastroesophageal reflux disease)    Hypertension    Nausea and vomiting 03/31/2021   Osteomyelitis (HCC)    right third toe   Peripheral vascular disease (HCC)    Ulcer 08/14/20   Past Surgical History:  Procedure Laterality Date   AMPUTATION Right 08/08/2017   Procedure: AMPUTATION RIGHT 3RD TOE;  Surgeon: Nadara Mustard, MD;  Location: [facility] OR;  Service: Orthopedics;  Laterality: Right;   BIOPSY  08/14/2020   Procedure: BIOPSY;   Surgeon: Vida Rigger, MD;  Location: [facility] ENDOSCOPY;  Service: Endoscopy;;   Bunionectomy Right 2006     Right   Bunionectony Left 2006      August   COLONOSCOPY N/A 01/21/2014   Procedure: COLONOSCOPY;  Surgeon: Theda Belfast, MD;  Location: [facility] ENDOSCOPY;  Service: Endoscopy;  Laterality: N/A;   ESOPHAGOGASTRODUODENOSCOPY (EGD) WITH PROPOFOL N/A 08/14/2020   Procedure: ESOPHAGOGASTRODUODENOSCOPY (EGD) WITH PROPOFOL;  Surgeon: Vida Rigger, MD;  Location: [facility] ENDOSCOPY;  Service: Endoscopy;  Laterality: N/A;   IR THORACENTESIS ASP PLEURAL SPACE W/IMG GUIDE  05/03/2021   RIGHT/LEFT HEART CATH AND CORONARY ANGIOGRAPHY N/A 05/11/2021   Procedure: RIGHT/LEFT HEART CATH AND CORONARY ANGIOGRAPHY;  Surgeon: Lennette Bihari, MD;  Location: [facility] INVASIVE [facility] LAB;  Service: Cardiovascular;  Laterality: N/A;   TOTAL HIP ARTHROPLASTY Left 05/11/2022   TUBAL LIGATION     WISDOM TOOTH EXTRACTION       A IV Location/Drains/Wounds Patient Lines/Drains/Airways Status     Active Line/Drains/Airways     Name Placement date Placement time Site Days   Wound / Incision (Open or Dehisced) 05/07/21 Non-pressure wound Sacrum Medial;Posterior;Mid small 2 x 1 wound noted to sacrum/coccyx area pink in color. 05/07/21  1400  Sacrum  699   Wound / Incision (Open or Dehisced) 05/07/21 Non-pressure wound Buttocks Left;Posterior small 4 x .05 cm wound noted to lt. buttock with top layer skin missing (documented as abrasion on admission) 05/07/21  1400  Buttocks  699            Intake/Output Last 24 hours No intake or output data in the 24 hours ending 04/06/23 1250  Labs/Imaging Results for orders placed or performed during the hospital encounter of 04/05/23 (from the past 48 hour(s))  CBC with Differential     Status: Abnormal   Collection Time: 04/06/23  6:24 AM  Result Value Ref Range   WBC 5.6 4.0 - 10.5 K/uL   RBC 3.94 3.87 - 5.11 MIL/uL   Hemoglobin 13.1 12.0 - 15.0 g/dL   HCT 29.5 62.1 - 30.8 %   MCV  100.8 (H) 80.0 - 100.0 fL   MCH 33.2 26.0 - 34.0 pg   MCHC 33.0 30.0 - 36.0 g/dL   RDW 65.7 84.6 - 96.2 %   Platelets 145 (L) 150 - 400 K/uL   nRBC 0.0 0.0 - 0.2 %   Neutrophils Relative % 75 %   Neutro Abs 4.2 1.7 - 7.7 K/uL   Lymphocytes Relative 17 %   Lymphs Abs 1.0 0.7 - 4.0 K/uL   Monocytes Relative 7 %   Monocytes Absolute 0.4 0.1 - 1.0 K/uL   Eosinophils Relative 1 %   Eosinophils Absolute 0.1 0.0 - 0.5 K/uL   Basophils Relative 0 %   Basophils Absolute 0.0 0.0 - 0.1 K/uL   Immature Granulocytes 0 %   Abs Immature Granulocytes 0.02 0.00 - 0.07 K/uL    Comment: Performed at [facility], [address], [place], [place] [address]  Basic metabolic panel     Status: Abnormal   Collection Time: 04/06/23  6:24 AM  Result Value Ref Range   Sodium 139 135 - 145 mmol/L   Potassium 3.5 3.5 - 5.1 mmol/L   Chloride 104 98 - 111 mmol/L   CO2 28 22 - 32 mmol/L   Glucose, Bld 83 70 - 99 mg/dL    Comment: Glucose reference range applies only to samples taken after fasting for at least 8 hours.   BUN <5 (L) 8 - 23 mg/dL   Creatinine, Ser 1.32 0.44 - 1.00 mg/dL   Calcium 8.0 (L) 8.9 - 10.3 mg/dL   GFR, Estimated >44 >01 mL/min    Comment: (NOTE) Calculated using the CKD-EPI Creatinine Equation (2021)    Anion gap 7 5 - 15    Comment: Performed at [facility], [address] [place], [place] [address]  CBG monitoring, ED     Status: None   Collection Time: 04/06/23  8:15 AM  Result Value Ref Range   Glucose-Capillary 84 70 - 99 mg/dL    Comment: Glucose reference range applies only to samples taken after fasting for at least 8 hours.  Urinalysis, w/ Reflex to Culture (Infection Suspected) -Urine, Clean Catch     Status: Abnormal   Collection Time: 04/06/23  8:31 AM  Result Value Ref Range   Specimen Source URINE, CLEAN CATCH    Color, Urine YELLOW YELLOW   APPearance CLEAR CLEAR   Specific Gravity, Urine 1.015 1.005 - 1.030   pH 6.0 5.0 - 8.0   Glucose, UA  NEGATIVE NEGATIVE mg/dL   Hgb urine dipstick NEGATIVE NEGATIVE   Bilirubin Urine NEGATIVE NEGATIVE   Ketones, ur 5 (A) NEGATIVE mg/dL   Protein, ur NEGATIVE NEGATIVE mg/dL   Nitrite NEGATIVE NEGATIVE   Leukocytes,Ua NEGATIVE NEGATIVE   RBC / HPF 0-5 0 - 5 RBC/hpf   WBC, UA 0-5 0 - 5  WBC/hpf    Comment:        Reflex urine culture not performed if WBC <=10, OR if Squamous epithelial cells >5. If Squamous epithelial cells >5 suggest recollection.    Bacteria, UA NONE SEEN NONE SEEN   Squamous Epithelial / HPF 0-5 0 - 5 /HPF    Comment: Performed at [facility], [address], [place], [place] [address]  POC CBG, ED     Status: None   Collection Time: 04/06/23  9:02 AM  Result Value Ref Range   Glucose-Capillary 98 70 - 99 mg/dL    Comment: Glucose reference range applies only to samples taken after fasting for at least 8 hours.  POC CBG, ED     Status: None   Collection Time: 04/06/23 10:28 AM  Result Value Ref Range   Glucose-Capillary 93 70 - 99 mg/dL    Comment: Glucose reference range applies only to samples taken after fasting for at least 8 hours.  POC CBG, ED     Status: None   Collection Time: 04/06/23 12:46 PM  Result Value Ref Range   Glucose-Capillary 90 70 - 99 mg/dL    Comment: Glucose reference range applies only to samples taken after fasting for at least 8 hours.   Comment 1 Notify RN    Comment 2 Document in Chart    DG Tibia/Fibula Left  Result Date: 04/06/2023 CLINICAL DATA:  Pain after fall EXAM: LEFT TIBIA AND FIBULA - 2 VIEW COMPARISON:  None Available. FINDINGS: Osteopenia. No fracture or dislocation. Preserved adjacent joint spaces. Mild soft tissue swelling. IMPRESSION: Osteopenia.  No acute osseous abnormality Electronically Signed   By: Karen Kays M.D.   On: 04/06/2023 11:19   CT Knee Left Wo Contrast  Result Date: 04/06/2023 CLINICAL DATA:  72 year old female with history of trauma from a recent fall. Left knee pain. EXAM: CT OF THE LEFT  KNEE WITHOUT CONTRAST TECHNIQUE: Multidetector CT imaging of the left knee was performed according to the standard protocol. Multiplanar CT image reconstructions were also generated. RADIATION DOSE REDUCTION: This exam was performed according to the departmental dose-optimization program which includes automated exposure control, adjustment of the mA and/or kV according to patient size and/or use of iterative reconstruction technique. COMPARISON:  Left knee radiograph 04/05/2023. FINDINGS: Bones/Joint/Cartilage Subtle minimally impacted nondisplaced fracture of the tibial plateau laterally best appreciated on coronal image 99 of series 7 where it extends to the joint space at the level of the anterior aspect of the tibial spine. Subtle disruption of the anterior tibial cortex also noted on sagittal image 94 of series 8 and axial image 92 of series 3. Femur and fibula appear intact, as does the patella Ligaments Suboptimally assessed by CT. Muscles and Tendons Poorly evaluated by CT, but generally unremarkable. Soft tissues Large high attenuation joint effusion indicative of hemarthrosis. IMPRESSION: 1. Subtle nondisplaced minimally impacted fracture of the lateral left tibial plateau with hemarthrosis, as above. Electronically Signed   By: Trudie Reed M.D.   On: 04/06/2023 05:43   CT Head Wo Contrast  Result Date: 04/06/2023 CLINICAL DATA:  Polytrauma, blunt EXAM: CT HEAD WITHOUT CONTRAST CT CERVICAL SPINE WITHOUT CONTRAST TECHNIQUE: Multidetector CT imaging of the head and cervical spine was performed following the standard protocol without intravenous contrast. Multiplanar CT image reconstructions of the cervical spine were also generated. RADIATION DOSE REDUCTION: This exam was performed according to the departmental dose-optimization program which includes automated exposure control, adjustment of the mA and/or kV according  to patient size and/or use of iterative reconstruction technique. COMPARISON:   MRI head 04/20/2021, CT head 02/22/2021 FINDINGS: CT HEAD FINDINGS Brain: Cerebral ventricle sizes are concordant with the degree of cerebral volume loss. Patchy and confluent areas of decreased attenuation are noted throughout the deep and periventricular white matter of the cerebral hemispheres bilaterally, compatible with chronic microvascular ischemic disease. No evidence of large-territorial acute infarction. No parenchymal hemorrhage. No mass lesion. No extra-axial collection. No mass effect or midline shift. No hydrocephalus. Basilar cisterns are patent. Empty sella. Vascular: No hyperdense vessel. Skull: No acute fracture or focal lesion. Sinuses/Orbits: Paranasal sinuses and mastoid air cells are clear. The orbits are unremarkable. Other: None. CT CERVICAL SPINE FINDINGS Alignment: Grade 1 anterolisthesis of C2 on C3, C3 on C4, C4 on C5. Skull base and vertebrae: Multilevel severe degenerative changes of the spine with associated severe neural foraminal stenosis bilaterally at the C4-C5 level. No severe osseous central canal stenosis. No acute fracture. No aggressive appearing focal osseous lesion or focal pathologic process. Soft tissues and spinal canal: No prevertebral fluid or swelling. No visible canal hematoma. Upper chest: Unremarkable. Other: None. IMPRESSION: 1. No acute intracranial abnormality. 2. No acute displaced fracture or traumatic listhesis of the cervical spine. 3. Empty sella. Findings is often a normal anatomic variant but can be associated with idiopathic intracranial hypertension (pseudotumor cerebri). 4. Multilevel severe degenerative changes of the spine with grade 1 anterolisthesis of C2 on C3, C3 on C4, C4 on C5. Associated severe neural foraminal stenosis bilaterally at the C4-C5 lev. Electronically Signed   By: Tish Frederickson M.D.   On: 04/06/2023 00:34   CT Cervical Spine Wo Contrast  Result Date: 04/06/2023 CLINICAL DATA:  Polytrauma, blunt EXAM: CT HEAD WITHOUT CONTRAST  CT CERVICAL SPINE WITHOUT CONTRAST TECHNIQUE: Multidetector CT imaging of the head and cervical spine was performed following the standard protocol without intravenous contrast. Multiplanar CT image reconstructions of the cervical spine were also generated. RADIATION DOSE REDUCTION: This exam was performed according to the departmental dose-optimization program which includes automated exposure control, adjustment of the mA and/or kV according to patient size and/or use of iterative reconstruction technique. COMPARISON:  MRI head 04/20/2021, CT head 02/22/2021 FINDINGS: CT HEAD FINDINGS Brain: Cerebral ventricle sizes are concordant with the degree of cerebral volume loss. Patchy and confluent areas of decreased attenuation are noted throughout the deep and periventricular white matter of the cerebral hemispheres bilaterally, compatible with chronic microvascular ischemic disease. No evidence of large-territorial acute infarction. No parenchymal hemorrhage. No mass lesion. No extra-axial collection. No mass effect or midline shift. No hydrocephalus. Basilar cisterns are patent. Empty sella. Vascular: No hyperdense vessel. Skull: No acute fracture or focal lesion. Sinuses/Orbits: Paranasal sinuses and mastoid air cells are clear. The orbits are unremarkable. Other: None. CT CERVICAL SPINE FINDINGS Alignment: Grade 1 anterolisthesis of C2 on C3, C3 on C4, C4 on C5. Skull base and vertebrae: Multilevel severe degenerative changes of the spine with associated severe neural foraminal stenosis bilaterally at the C4-C5 level. No severe osseous central canal stenosis. No acute fracture. No aggressive appearing focal osseous lesion or focal pathologic process. Soft tissues and spinal canal: No prevertebral fluid or swelling. No visible canal hematoma. Upper chest: Unremarkable. Other: None. IMPRESSION: 1. No acute intracranial abnormality. 2. No acute displaced fracture or traumatic listhesis of the cervical spine. 3. Empty  sella. Findings is often a normal anatomic variant but can be associated with idiopathic intracranial hypertension (pseudotumor cerebri). 4. Multilevel severe degenerative changes of  the spine with grade 1 anterolisthesis of C2 on C3, C3 on C4, C4 on C5. Associated severe neural foraminal stenosis bilaterally at the C4-C5 lev. Electronically Signed   By: Tish Frederickson M.D.   On: 04/06/2023 00:34   DG Hip Unilat With Pelvis 2-3 Views Left  Result Date: 04/05/2023 CLINICAL DATA:  Fall EXAM: DG HIP (WITH OR WITHOUT PELVIS) 2-3V LEFT COMPARISON:  None Available. FINDINGS: Surgical changes of left total hip arthroplasty are identified. Normal alignment. No acute fracture or dislocation. Vascular calcifications noted. IMPRESSION: 1. Left total hip arthroplasty. No acute fracture or dislocation. Electronically Signed   By: Helyn Numbers M.D.   On: 04/05/2023 23:09   DG Knee Complete 4 Views Left  Result Date: 04/05/2023 CLINICAL DATA:  Left knee pain.  Recent fall. EXAM: LEFT KNEE - COMPLETE 4+ VIEW COMPARISON:  None Available. FINDINGS: The bones are subjectively under mineralized. No evidence of fracture. No dislocation. There is a knee joint effusion. Mild tibiofemoral joint space narrowing. Mild generalized soft tissue edema. IMPRESSION: 1. Moderate sized knee joint effusion. 2. No evidence of fracture or dislocation. Osteopenia/osteoporosis. If there is persistent clinical concern for fracture, consider CT. Electronically Signed   By: Narda Rutherford M.D.   On: 04/05/2023 22:12    Pending Labs Unresulted Labs (From admission, onward)     Start     Ordered   04/07/23 0500  CBC with Differential/Platelet  Tomorrow morning,   R        04/06/23 1239   04/06/23 1156  HIV Antibody (routine testing w rflx)  (HIV Antibody (Routine testing w reflex) panel)  Once,   R        04/06/23 1155            Vitals/Pain Today's Vitals   04/06/23 1015 04/06/23 1030 04/06/23 1034 04/06/23 1037  BP: 119/69  128/69    Pulse: 81 89    Resp:      Temp:      TempSrc:      SpO2: 99% 99%    Weight:      Height:      PainSc:   7  7     Isolation Precautions No active isolations  Medications Medications  HYDROcodone-acetaminophen (NORCO/VICODIN) 5-325 MG per tablet 1 tablet (1 tablet Oral Given 04/06/23 1035)  sodium chloride flush (NS) 0.9 % injection 3 mL (has no administration in time range)  sodium chloride flush (NS) 0.9 % injection 3 mL (has no administration in time range)  0.9 %  sodium chloride infusion (has no administration in time range)  HYDROcodone-acetaminophen (NORCO/VICODIN) 5-325 MG per tablet 1 tablet (1 tablet Oral Given 04/05/23 2253)  HYDROcodone-acetaminophen (NORCO/VICODIN) 5-325 MG per tablet 1 tablet (1 tablet Oral Given 04/06/23 0437)    Mobility Hasn't ambulated after fx finding.      Focused Assessments Fall with L tibia fx   R Recommendations: See Admitting Provider Note  Report given to:   Additional Notes: L leg in knee immobilizer

## 2023-04-06 NOTE — Assessment & Plan Note (Signed)
S/p cardiac arrest in 2022 secondary to electrolyte derangements  EF initially: 25-30% in 05/2021 and recovered to 55-60% in 08/2021  -euvolemic on exam -continue medical management with entresto, spironolactone, toprol-xl, demadex  -daily weights

## 2023-04-06 NOTE — Assessment & Plan Note (Signed)
-

## 2023-04-06 NOTE — Consult Note (Addendum)
Orthopedic Surgery Consult Note  Assessment: Patient is a 72 y.o. female with tow issues:   1) left, closed and nondisplaced tibial plateau fracture 2) osteoporosis   Plan: -Will treat fracture nonoperatively -Explained that her prior left hip fracture from standing height gives her the diagnosis of osteoporosis -Okay for diet from orthopedic perspective -Can resume home Eliquis for DVT prophylaxis -Weight bearing status: NWB LLE in KI -Okay to come out of the KI when in bed to work on range of motion at the knee -PT evaluate and treat -Pain control -Dispo: per primary -Have arranged for orthopedic follow up with myself. Information placed into discharge tab in the instructions  ___________________________________________________________________________   Reason for consult: left tibial plateau fracture  History:  Patient is a 72 y.o. female who had a fall on 04/04/2023.  She tripped and fell and landed on her left knee.  She noted acute onset of left knee pain.  She has had difficulty putting weight on the left side and as a result has had trouble with mobilizing.  She called EMS on 04/05/2023 and was brought to [facility] emergency department.  She is still reporting left knee pain.  No other extremity pain.     Past medical history:  HTN History of DVT on Eliquis CHF GERD Hepatic steatosis Osteoporosis   Physical Exam:  BMI of 32.9  General: no acute distress, appears stated age Neurologic: alert, answering questions appropriately, following commands Cardiovascular: regular rate, no cyanosis Respiratory: unlabored breathing on room air, symmetric chest rise Psychiatric: appropriate affect, normal cadence to speech  MSK:    -Left lower extremity  No tenderness to palpation over extremity, except over the knee. No open wounds, no gross deformity, no pain with log roll  Extensor mechanism intact Fires hip flexors, quadriceps, hamstrings, tibialis  anterior, gastrocnemius and soleus, extensor hallucis longus Plantarflexes and dorsiflexes toes Sensation intact to light touch in sural, saphenous, tibial, deep peroneal, and superficial peroneal nerve distributions Foot warm and well perfused  Imaging: CT of the left knee from 04/05/2023 was independently reviewed and interpreted, showing a nondisplaced lateral tibial fracture. There is no joint depression. No other fracture seen. No condylar widening seen.    Patient name: Dondra Rhett Patient MRN: [id number] Date: 04/06/23

## 2023-04-06 NOTE — H&P (Addendum)
History and Physical    Patient: Debbie Barry HKV:[id number] DOB: 1951-06-07 DOA: 04/05/2023 DOS: the patient was seen and examined on 04/06/2023 PCP: Dorothyann Peng, MD  Patient coming from: Home - lives with her husband. She ambulates independently. Cane at times outside of home    Chief Complaint: mechanical fall   HPI: Debbie Barry is a 72 y.o. female with medical history significant of fatty liver, GERD, HTN, hx of OM of toe, PVD, systolic CHF, PMVT/torsades/cardiac arrest, nonobstructive CAD, depression, hx of DVT who presented to ED after fall on Friday 04/04/23 around 3pm.  She wanted to wait to see if pain would get better, but it got worse.  She was in the kitchen and walking and her left leg just went out and her food went all over. Her husband came and picked her up. She did hit her head and bust her lip. She was unable to put any weight on her left leg. She had no prodromal symptoms including dizziness, chest pain, palpitations.  Her leg sometimes goes out. This happened last year and she had a hip fracture.    She has been feeling good. Denies any fever/chills, vision changes/headaches, chest pain or palpitations, shortness of breath or cough, abdominal pain, N/V/D, dysuria or leg swelling.   She does not smoke or drink alcohol.   ER Course:  vitals: temp: 99.6, bp: 143/112, HR: 100, RR: 18, oxygen:99% RA Pertinent labs: none CT  head: no acute finding. Empty sella  CT cevical spine: no acute findings. Multilevel severe degenerative changes of the spine with grade 1 anterolisthesis of C2 on C3, C3 on C4, C4 on C5. Associated severe neural foraminal stenosis bilaterally at the C4-C5 lev. CT left knee: Subtle nondisplaced minimally impacted fracture of the lateral left tibial plateau with hemarthrosis, as above. In ED: ortho consulted. Given pain medication. TRH asked to admit    Review of Systems: As mentioned in the history of present illness. All other systems  reviewed and are negative. Past Medical History:  Diagnosis Date   Fatty liver    GERD (gastroesophageal reflux disease)    Hypertension    Nausea and vomiting 03/31/2021   Osteomyelitis (HCC)    right third toe   Peripheral vascular disease (HCC)    Ulcer 08/14/20   Past Surgical History:  Procedure Laterality Date   AMPUTATION Right 08/08/2017   Procedure: AMPUTATION RIGHT 3RD TOE;  Surgeon: Nadara Mustard, MD;  Location: [facility];  Service: Orthopedics;  Laterality: Right;   BIOPSY  08/14/2020   Procedure: BIOPSY;  Surgeon: Vida Rigger, MD;  Location: [facility] ENDOSCOPY;  Service: Endoscopy;;   Bunionectomy Right 2006     Right   Bunionectony Left 2006      August   COLONOSCOPY N/A 01/21/2014   Procedure: COLONOSCOPY;  Surgeon: Theda Belfast, MD;  Location: [facility]OSCOPY;  Service: Endoscopy;  Laterality: N/A;   ESOPHAGOGASTRODUODENOSCOPY (EGD) WITH PROPOFOL N/A 08/14/2020   Procedure: ESOPHAGOGASTRODUODENOSCOPY (EGD) WITH PROPOFOL;  Surgeon: Vida Rigger, MD;  Location: [facility] ENDOSCOPY;  Service: Endoscopy;  Laterality: N/A;   IR THORACENTESIS ASP PLEURAL SPACE W/IMG GUIDE  05/03/2021   RIGHT/LEFT HEART CATH AND CORONARY ANGIOGRAPHY N/A 05/11/2021   Procedure: RIGHT/LEFT HEART CATH AND CORONARY ANGIOGRAPHY;  Surgeon: Lennette Bihari, MD;  Location: [facility];  Service: Cardiovascular;  Laterality: N/A;   TOTAL HIP ARTHROPLASTY Left 05/11/2022   TUBAL LIGATION     WISDOM TOOTH EXTRACTION     Social History:  reports that she has never smoked. She has never used smokeless tobacco. She reports that she does not currently use alcohol. She reports that she does not use drugs.  Allergies  Allergen Reactions   Carvedilol Diarrhea    Family History  Problem Relation Age of Onset   Breast cancer Mother    Colon cancer Mother    Heart Problems Mother    Hypertension Mother    Multiple myeloma Mother    Arthritis Mother    Cancer Mother    Heart disease Mother    Hypertension  Father    Heart disease Father    Multiple myeloma Father    Arthritis Father    Cancer Father     Prior to Admission medications   Medication Sig Start Date End Date Taking? Authorizing Provider  acetaminophen (TYLENOL) 325 MG tablet Take 650 mg by mouth every 6 (six) hours as needed for moderate pain.    [provider]  apixaban (ELIQUIS) 5 MG TABS tablet TAKE 1 TABLET(5 MG) BY MOUTH TWICE DAILY 02/27/23   O'Neal, Ronnald Ramp, MD  bismuth subsalicylate (PEPTO BISMOL) 262 MG/15ML suspension Take 30 mLs by mouth every 6 (six) hours as needed for indigestion.    [provider]  calcium carbonate (TUMS EX) 750 MG chewable tablet Chew 1 tablet by mouth daily as needed for heartburn.    [provider]  Cholecalciferol (VITAMIN D3) 5000 units CAPS Take 5,000 Units by mouth daily.    [provider]  Magnesium 500 MG TABS Take 1 tablet by mouth daily.    [provider]  metoprolol succinate (TOPROL XL) 25 MG 24 hr tablet Take 1 tablet (25 mg total) by mouth daily. 03/10/23   O'NealRonnald Ramp, MD  Multiple Vitamin (MULTIVITAMIN WITH MINERALS) TABS tablet Take 1 tablet by mouth daily.    [provider]  ondansetron (ZOFRAN ODT) 4 MG disintegrating tablet Take 1 tablet (4 mg total) by mouth every 8 (eight) hours as needed for nausea or vomiting. 04/04/21   Narda Bonds, MD  pantoprazole (PROTONIX) 40 MG tablet Take 1 tablet (40 mg total) by mouth daily. 12/31/22   Dorothyann Peng, MD  potassium chloride SA (KLOR-CON M) 20 MEQ tablet TAKE 1 TABLET(20 MEQ) BY MOUTH TWICE DAILY 09/20/22   O'Neal, Ronnald Ramp, MD  sacubitril-valsartan (ENTRESTO) 24-26 MG Take 1 tablet by mouth 2 (two) times daily. 09/25/22   Sande Rives, MD  sertraline (ZOLOFT) 50 MG tablet Take 1 tablet (50 mg total) by mouth daily. 02/26/23 02/26/24  Dorothyann Peng, MD  spironolactone (ALDACTONE) 25 MG tablet Take 0.5 tablets (12.5 mg total) by mouth daily. 09/25/22  09/20/23  Sande Rives, MD  sucralfate (CARAFATE) 1 g tablet TAKE 1 TABLET(1 GRAM) BY MOUTH TWICE DAILY prn 12/31/22   Dorothyann Peng, MD  torsemide (DEMADEX) 20 MG tablet Take 1 tablet (20 mg total) by mouth daily as needed (swelling). 03/10/23 06/08/23  Sande Rives, MD    Physical Exam: Vitals:   04/06/23 0845 04/06/23 0900 04/06/23 1015 04/06/23 1030  BP: (!) 115/57 122/65 119/69 128/69  Pulse: 81 79 81 89  Resp: 12     Temp: 98.2 F (36.8 C)     TempSrc: Oral     SpO2: 99% 100% 99% 99%  Weight:      Height:       General:  Appears calm and comfortable and is in NAD Eyes:  PERRL, EOMI, normal lids, iris ENT:  grossly normal hearing, lips & tongue, mmm; appropriate dentition Neck:  no LAD, masses or thyromegaly; no carotid bruits Cardiovascular:  RRR, no m/r/g. No LE edema.  Respiratory:   CTA bilaterally with no wheezes/rales/rhonchi.  Normal respiratory effort. Abdomen:  soft, NT, ND, NABS Back:   normal alignment, no CVAT Skin:  no rash or induration seen on limited exam Musculoskeletal:  left knee: edema around knee, proximal tibia. TTP over knee cap/tibial plateau.  Lower extremity:  No LE edema.  Limited foot exam with no ulcerations.  2+ distal pulses. Sensation intact  Psychiatric:  grossly normal mood and affect, speech fluent and appropriate, AOx3 Neurologic:  CN 2-12 grossly intact, moves all extremities in coordinated fashion, sensation intact   Radiological Exams on Admission: Independently reviewed - see discussion in A/P where applicable  DG Tibia/Fibula Left  Result Date: 04/06/2023 CLINICAL DATA:  Pain after fall EXAM: LEFT TIBIA AND FIBULA - 2 VIEW COMPARISON:  None Available. FINDINGS: Osteopenia. No fracture or dislocation. Preserved adjacent joint spaces. Mild soft tissue swelling. IMPRESSION: Osteopenia.  No acute osseous abnormality Electronically Signed   By: Karen Kays M.D.   On: 04/06/2023 11:19   CT Knee Left Wo Contrast  Result  Date: 04/06/2023 CLINICAL DATA:  72 year old female with history of trauma from a recent fall. Left knee pain. EXAM: CT OF THE LEFT KNEE WITHOUT CONTRAST TECHNIQUE: Multidetector CT imaging of the left knee was performed according to the standard protocol. Multiplanar CT image reconstructions were also generated. RADIATION DOSE REDUCTION: This exam was performed according to the departmental dose-optimization program which includes automated exposure control, adjustment of the mA and/or kV according to patient size and/or use of iterative reconstruction technique. COMPARISON:  Left knee radiograph 04/05/2023. FINDINGS: Bones/Joint/Cartilage Subtle minimally impacted nondisplaced fracture of the tibial plateau laterally best appreciated on coronal image 99 of series 7 where it extends to the joint space at the level of the anterior aspect of the tibial spine. Subtle disruption of the anterior tibial cortex also noted on sagittal image 94 of series 8 and axial image 92 of series 3. Femur and fibula appear intact, as does the patella Ligaments Suboptimally assessed by CT. Muscles and Tendons Poorly evaluated by CT, but generally unremarkable. Soft tissues Large high attenuation joint effusion indicative of hemarthrosis. IMPRESSION: 1. Subtle nondisplaced minimally impacted fracture of the lateral left tibial plateau with hemarthrosis, as above. Electronically Signed   By: Trudie Reed M.D.   On: 04/06/2023 05:43   CT Head Wo Contrast  Result Date: 04/06/2023 CLINICAL DATA:  Polytrauma, blunt EXAM: CT HEAD WITHOUT CONTRAST CT CERVICAL SPINE WITHOUT CONTRAST TECHNIQUE: Multidetector CT imaging of the head and cervical spine was performed following the standard protocol without intravenous contrast. Multiplanar CT image reconstructions of the cervical spine were also generated. RADIATION DOSE REDUCTION: This exam was performed according to the departmental dose-optimization program which includes automated  exposure control, adjustment of the mA and/or kV according to patient size and/or use of iterative reconstruction technique. COMPARISON:  MRI head 04/20/2021, CT head 02/22/2021 FINDINGS: CT HEAD FINDINGS Brain: Cerebral ventricle sizes are concordant with the degree of cerebral volume loss. Patchy and confluent areas of decreased attenuation are noted throughout the deep and periventricular white matter of the cerebral hemispheres bilaterally, compatible with chronic microvascular ischemic disease. No evidence of large-territorial acute infarction. No parenchymal hemorrhage. No mass lesion. No extra-axial collection. No mass effect or midline shift. No hydrocephalus. Basilar cisterns are patent. Empty sella. Vascular: No hyperdense vessel.  Skull: No acute fracture or focal lesion. Sinuses/Orbits: Paranasal sinuses and mastoid air cells are clear. The orbits are unremarkable. Other: None. CT CERVICAL SPINE FINDINGS Alignment: Grade 1 anterolisthesis of C2 on C3, C3 on C4, C4 on C5. Skull base and vertebrae: Multilevel severe degenerative changes of the spine with associated severe neural foraminal stenosis bilaterally at the C4-C5 level. No severe osseous central canal stenosis. No acute fracture. No aggressive appearing focal osseous lesion or focal pathologic process. Soft tissues and spinal canal: No prevertebral fluid or swelling. No visible canal hematoma. Upper chest: Unremarkable. Other: None. IMPRESSION: 1. No acute intracranial abnormality. 2. No acute displaced fracture or traumatic listhesis of the cervical spine. 3. Empty sella. Findings is often a normal anatomic variant but can be associated with idiopathic intracranial hypertension (pseudotumor cerebri). 4. Multilevel severe degenerative changes of the spine with grade 1 anterolisthesis of C2 on C3, C3 on C4, C4 on C5. Associated severe neural foraminal stenosis bilaterally at the C4-C5 lev. Electronically Signed   By: Tish Frederickson M.D.   On:  04/06/2023 00:34   CT Cervical Spine Wo Contrast  Result Date: 04/06/2023 CLINICAL DATA:  Polytrauma, blunt EXAM: CT HEAD WITHOUT CONTRAST CT CERVICAL SPINE WITHOUT CONTRAST TECHNIQUE: Multidetector CT imaging of the head and cervical spine was performed following the standard protocol without intravenous contrast. Multiplanar CT image reconstructions of the cervical spine were also generated. RADIATION DOSE REDUCTION: This exam was performed according to the departmental dose-optimization program which includes automated exposure control, adjustment of the mA and/or kV according to patient size and/or use of iterative reconstruction technique. COMPARISON:  MRI head 04/20/2021, CT head 02/22/2021 FINDINGS: CT HEAD FINDINGS Brain: Cerebral ventricle sizes are concordant with the degree of cerebral volume loss. Patchy and confluent areas of decreased attenuation are noted throughout the deep and periventricular white matter of the cerebral hemispheres bilaterally, compatible with chronic microvascular ischemic disease. No evidence of large-territorial acute infarction. No parenchymal hemorrhage. No mass lesion. No extra-axial collection. No mass effect or midline shift. No hydrocephalus. Basilar cisterns are patent. Empty sella. Vascular: No hyperdense vessel. Skull: No acute fracture or focal lesion. Sinuses/Orbits: Paranasal sinuses and mastoid air cells are clear. The orbits are unremarkable. Other: None. CT CERVICAL SPINE FINDINGS Alignment: Grade 1 anterolisthesis of C2 on C3, C3 on C4, C4 on C5. Skull base and vertebrae: Multilevel severe degenerative changes of the spine with associated severe neural foraminal stenosis bilaterally at the C4-C5 level. No severe osseous central canal stenosis. No acute fracture. No aggressive appearing focal osseous lesion or focal pathologic process. Soft tissues and spinal canal: No prevertebral fluid or swelling. No visible canal hematoma. Upper chest: Unremarkable. Other:  None. IMPRESSION: 1. No acute intracranial abnormality. 2. No acute displaced fracture or traumatic listhesis of the cervical spine. 3. Empty sella. Findings is often a normal anatomic variant but can be associated with idiopathic intracranial hypertension (pseudotumor cerebri). 4. Multilevel severe degenerative changes of the spine with grade 1 anterolisthesis of C2 on C3, C3 on C4, C4 on C5. Associated severe neural foraminal stenosis bilaterally at the C4-C5 lev. Electronically Signed   By: Tish Frederickson M.D.   On: 04/06/2023 00:34   DG Hip Unilat With Pelvis 2-3 Views Left  Result Date: 04/05/2023 CLINICAL DATA:  Fall EXAM: DG HIP (WITH OR WITHOUT PELVIS) 2-3V LEFT COMPARISON:  None Available. FINDINGS: Surgical changes of left total hip arthroplasty are identified. Normal alignment. No acute fracture or dislocation. Vascular calcifications noted. IMPRESSION: 1. Left total  hip arthroplasty. No acute fracture or dislocation. Electronically Signed   By: Helyn Numbers M.D.   On: 04/05/2023 23:09   DG Knee Complete 4 Views Left  Result Date: 04/05/2023 CLINICAL DATA:  Left knee pain.  Recent fall. EXAM: LEFT KNEE - COMPLETE 4+ VIEW COMPARISON:  None Available. FINDINGS: The bones are subjectively under mineralized. No evidence of fracture. No dislocation. There is a knee joint effusion. Mild tibiofemoral joint space narrowing. Mild generalized soft tissue edema. IMPRESSION: 1. Moderate sized knee joint effusion. 2. No evidence of fracture or dislocation. Osteopenia/osteoporosis. If there is persistent clinical concern for fracture, consider CT. Electronically Signed   By: Narda Rutherford M.D.   On: 04/05/2023 22:12     Labs on Admission: I have personally reviewed the available labs and imaging studies at the time of the admission.  Pertinent labs:   None   Assessment and Plan: Principal Problem:   Closed fracture of left tibial plateau Active Problems:   CAD (coronary artery disease)    Hypertensive heart disease with chronic systolic congestive heart failure (HCC)   Essential hypertension, benign   Gastritis   DVT (deep venous thrombosis) (HCC)   Major depression, recurrent, chronic (HCC)    Assessment and Plan: * Closed fracture of left tibial plateau 72 yo presenting to ED after mechanical fall at home onto left leg with subsequent left tibial plateau fracture with hemarthrosis unable to bear weight  -obs to med-surg -ortho consulted: Dr. Christell Constant -PT to see and conservative therapy  -knee brace in place -pain control with norco PRN -she would like to try to avoid rehab and do Pt at home if possible  -Dr. Christell Constant has arranged outpatient follow up with him -okay for continued eliquis  - NWB LLE in [place] per ortho   CAD (coronary artery disease) Non obstructive CAD Continue eliquis   Hypertensive heart disease with chronic systolic congestive heart failure (HCC) S/p cardiac arrest in 2022 secondary to electrolyte derangements  EF initially: 25-30% in 05/2021 and recovered to 55-60% in 08/2021  -euvolemic on exam -continue medical management with entresto, spironolactone, toprol-xl, demadex  -daily weights   Essential hypertension, benign Continue entresto, toprol, aldactone and demadex   DVT (deep venous thrombosis) (HCC) On eliquis. Had this in 2022 Unsure if unprovoked and why still on [facility] No history of previous blood clots, has hx of iron overload   Gastritis Hx of UGIB Continue protonix   Major depression, recurrent, chronic (HCC) Continue zoloft     Advance Care Planning:   Code Status: Full Code   Consults: ortho, PT  DVT Prophylaxis: eliquis   Family Communication: none   Severity of Illness: The appropriate patient status for this patient is OBSERVATION. Observation status is judged to be reasonable and necessary in order to provide the required intensity of service to ensure the patient's safety. The patient's presenting symptoms, physical  exam findings, and initial radiographic and laboratory data in the context of their medical condition is felt to place them at decreased risk for further clinical deterioration. Furthermore, it is anticipated that the patient will be medically stable for discharge from the hospital within 2 midnights of admission.   Author: Orland Mustard, MD 04/06/2023 12:38 PM  For on call review [email or web address].

## 2023-04-06 NOTE — Plan of Care (Signed)
  Problem: Education: Goal: Knowledge of General Education information will improve Description Including pain rating scale, medication(s)/side effects and non-pharmacologic comfort measures Outcome: Progressing   

## 2023-04-06 NOTE — Progress Notes (Signed)
Orthopedic Tech Progress Note Patient Details:  Debbie Barry 1951/02/18 [id number]  Ortho Devices Type of Ortho Device: Knee Immobilizer Ortho Device/Splint Location: lle Ortho Device/Splint Interventions: Ordered, Adjustment, Application   Post Interventions Patient Tolerated: Well Instructions Provided: Care of device, Adjustment of device  Trinna Post 04/06/2023, 6:58 AM

## 2023-04-06 NOTE — Progress Notes (Signed)
Pt admitted to 6N31 from the ED with LLE knee immobilizer on. Pt alert and oriented x 4.

## 2023-04-06 NOTE — Assessment & Plan Note (Signed)
Continue entresto, toprol, aldactone and demadex

## 2023-04-06 NOTE — Assessment & Plan Note (Signed)
Hx of UGIB Continue protonix

## 2023-04-06 NOTE — ED Notes (Signed)
Patient unable to bare weight on left knee.

## 2023-04-06 NOTE — Evaluation (Signed)
Physical Therapy Evaluation Patient Details Name: Debbie Barry MRN: [id number] DOB: [birth year] Today's Date: 04/06/2023  History of Present Illness  72 y.o. female presents to [facility] hospital on 04/05/2023 after a fall on 10/4. Pt found to have L tibial plateau fx. PMH includes HTN, DVT, CHF, GERD, hepatic steatosis, osteoporosis.  Clinical Impression  Pt presents to PT with deficits in strength, power, balance, functional mobility, gait. Pt is limited by current WB restrictions, having difficulty maintaining full NWB through LLE in sit to stands. Pt progresses within session to pivot while maintaining pressure of LLE, but will benefit from further transfer training to improve sit to stand quality and consistency. Pt is against potential inpatient PT services and would prefer to discharge home with HHPT. PT provides education on the need for NWB through LLE to allow for healing of tibial plateau fx. Pt will need to perform stand pivot or lateral scoot transfers and utilize her wheelchair to mobilize safely within the home. PT will follow up tomorrow for further functional mobility training.        If plan is discharge home, recommend the following: A little help with walking and/or transfers;A lot of help with bathing/dressing/bathroom;Assistance with cooking/housework;Assist for transportation;Help with stairs or ramp for entrance   Can travel by private vehicle   No    Equipment Recommendations None recommended by PT  Recommendations for Other Services       Functional Status Assessment Patient has had a recent decline in their functional status and demonstrates the ability to make significant improvements in function in a reasonable and predictable amount of time.     Precautions / Restrictions Precautions Precautions: Fall Required Braces or Orthoses: Knee Immobilizer - Left Knee Immobilizer - Left: On when out of bed or walking (able to perform AROM when resting in bed per  ortho) Restrictions Weight Bearing Restrictions: Yes LLE Weight Bearing: Non weight bearing      Mobility  Bed Mobility Overal bed mobility: Needs Assistance Bed Mobility: Supine to Sit, Sit to Supine     Supine to sit: Supervision, HOB elevated Sit to supine: Min assist        Transfers Overall transfer level: Needs assistance Equipment used: Rolling walker (2 wheels) Transfers: Sit to/from Stand, Bed to chair/wheelchair/BSC Sit to Stand: Min assist Stand pivot transfers: Contact guard assist         General transfer comment: pt requiring physical assist to power up from lower surfaces. Pt consistently bearing some weight through heel with sit to stands. Pt performs 2 SPT during session, bearing some weight intermittently through L foot during 1st transfer but maintaining NWB through LLE for entire transfer on 2nd attempt.    Ambulation/Gait Ambulation/Gait assistance: Min assist Gait Distance (Feet): 2 Feet Assistive device: Rolling walker (2 wheels) Gait Pattern/deviations:  (hop-to) Gait velocity: reduced Gait velocity interpretation: <1.31 ft/sec, indicative of household ambulator   General Gait Details: pt is able to hop for 2 steps forward and slide RLE along floor to mobilize back to bed  Stairs            Wheelchair Mobility     Tilt Bed    Modified Rankin (Stroke Patients Only)       Balance Overall balance assessment: Needs assistance Sitting-balance support: No upper extremity supported, Feet supported Sitting balance-Leahy Scale: Good     Standing balance support: Bilateral upper extremity supported, Reliant on assistive device for balance Standing balance-Leahy Scale: Poor  Pertinent Vitals/Pain Pain Assessment Pain Assessment: 0-10 Pain Score: 4  Pain Location: L knee Pain Descriptors / Indicators: Aching Pain Intervention(s): Monitored during session    Home Living Family/patient  expects to be discharged to:: Private residence Living Arrangements: Spouse/significant other Available Help at Discharge: Family Type of Home: House Home Access: Level entry       Home Layout: One level Home Equipment: Agricultural consultant (2 wheels);Cane - single point;BSC/3in1;Wheelchair - manual;Crutches      Prior Function Prior Level of Function : Independent/Modified Independent             Mobility Comments: ambulatory with PRN use of SPC, driving       Extremity/Trunk Assessment   Upper Extremity Assessment Upper Extremity Assessment: Overall WFL for tasks assessed    Lower Extremity Assessment Lower Extremity Assessment: LLE deficits/detail LLE Deficits / Details: LLE in KI, ROM and strength not formally assessed, observed ankle ROM WFL    Cervical / Trunk Assessment Cervical / Trunk Assessment: Normal  Communication   Communication Communication: No apparent difficulties Cueing Techniques: Verbal cues  Cognition Arousal: Alert Behavior During Therapy: WFL for tasks assessed/performed Overall Cognitive Status: Within Functional Limits for tasks assessed                                          General Comments General comments (skin integrity, edema, etc.): VSS on RA    Exercises     Assessment/Plan    PT Assessment Patient needs continued PT services  PT Problem List Decreased strength;Decreased activity tolerance;Decreased balance;Decreased mobility;Decreased knowledge of precautions;Pain       PT Treatment Interventions Gait training;DME instruction;Functional mobility training;Therapeutic activities;Therapeutic exercise;Balance training;Neuromuscular re-education;Patient/family education;Wheelchair mobility training    PT Goals (Current goals can be found in the Care Plan section)  Acute Rehab PT Goals Patient Stated Goal: to return home, improve maintenance of WB restrictions when transferring PT Goal Formulation: With  patient Time For Goal Achievement: 04/20/23 Potential to Achieve Goals: Fair Additional Goals Additional Goal #1: Pt will mobilize in a manual wheelchair for 50' with supervision to demonstrate the ability to mobilize within the home setting    Frequency Min 1X/week     Co-evaluation               AM-PAC PT "6 Clicks" Mobility  Outcome Measure Help needed turning from your back to your side while in a flat bed without using bedrails?: None Help needed moving from lying on your back to sitting on the side of a flat bed without using bedrails?: A Little Help needed moving to and from a bed to a chair (including a wheelchair)?: A Little Help needed standing up from a chair using your arms (e.g., wheelchair or bedside chair)?: A Little Help needed to walk in hospital room?: Total Help needed climbing 3-5 steps with a railing? : Total 6 Click Score: 15    End of Session Equipment Utilized During Treatment: Left knee immobilizer;Gait belt Activity Tolerance: Patient tolerated treatment well Patient left: in bed;with call bell/phone within reach;with bed alarm set Nurse Communication: Mobility status PT Visit Diagnosis: Other abnormalities of gait and mobility (R26.89);Pain Pain - Right/Left: Left Pain - part of body: Knee    Time: 1431-1455 PT Time Calculation (min) (ACUTE ONLY): 24 min   Charges:   PT Evaluation $PT Eval Low Complexity: 1 Low   PT General Charges $$  ACUTE PT VISIT: 1 Visit         Arlyss Gandy, PT, DPT Acute Rehabilitation Office [phone]   Arlyss Gandy 04/06/2023, 3:16 PM

## 2023-04-06 NOTE — Assessment & Plan Note (Signed)
On eliquis. Had this in 2022 Unsure if unprovoked and why still on [facility] No history of previous blood clots, has hx of iron overload

## 2023-04-06 NOTE — Discharge Instructions (Signed)
Orthopedic Surgery Discharge Instructions  Patient name: Debbie Barry Diagnosis: Left tibial plateau fracture (fracture of the knee near the top of the tibia or shin bone) Orthopedist: Willia Craze, MD  Activity: You should not put any weight on your left lower extremity.  You should be in a knee immobilizer when out of bed.  You can come out of the knee immobilizer when in bed.  You can and should work on range of motion exercises when you are in bed.  This will help prevent stiffness from developing in the knee.  You are okay to come out of the knee elbow mobilizer to shower as well.  You should put the brace back on when you are out of the shower.  You may resume any home blood thinners (warfarin, lovenox, apixaban, plavix, xarelto, etc) with this fracture. Take these medications as they were previously prescribed.  Reasons to Call the Office: You should feel free to call the office with any concerns or questions you have about your knee fracture, but you should definitely notify the office if you develop: -shortness of breath, chest pain, or trouble breathing -pain that is getting worse with each passing day -new weakness, numbness, or tingling in the left leg -other concerns about your fracture  Follow Up Appointments: You have an office appointment with Dr. Christell Constant on 05/01/2023 at 9:30am to check on the knee. Please arrive on time to this appointment.   Office Information:  -Willia Craze, MD -Phone number: [phone] -Address: [address]       [place], [place] [address]

## 2023-04-06 NOTE — Assessment & Plan Note (Addendum)
Non obstructive CAD Continue eliquis

## 2023-04-06 NOTE — Assessment & Plan Note (Addendum)
72 yo presenting to ED after mechanical fall at home onto left leg with subsequent left tibial plateau fracture with hemarthrosis unable to bear weight  -obs to med-surg -ortho consulted: Dr. Christell Constant -PT to see and conservative therapy  -knee brace in place -pain control with norco PRN -she would like to try to avoid rehab and do Pt at home if possible  -Dr. Christell Constant has arranged outpatient follow up with him -okay for continued eliquis  - NWB LLE in KI per ortho

## 2023-04-06 NOTE — ED Provider Notes (Signed)
Assumed care at shift change.  See prior notes for full H&P.  Briefly, 72 y.o. F who fell yesterday in kitchen.  Trouble walking since that time.    Plan:  imaging pending.  3:13 AM Imaging negative for any acute findings.  Patient unable to stand or ambulate at bedside.  Will get CT of the knee given degree of osteoporosis seen on x-ray.  If no acute findings, may need TOC consult.  CT Knee Left Wo Contrast  Result Date: 04/06/2023 CLINICAL DATA:  72 year old female with history of trauma from a recent fall. Left knee pain. EXAM: CT OF THE LEFT KNEE WITHOUT CONTRAST TECHNIQUE: Multidetector CT imaging of the left knee was performed according to the standard protocol. Multiplanar CT image reconstructions were also generated. RADIATION DOSE REDUCTION: This exam was performed according to the departmental dose-optimization program which includes automated exposure control, adjustment of the mA and/or kV according to patient size and/or use of iterative reconstruction technique. COMPARISON:  Left knee radiograph 04/05/2023. FINDINGS: Bones/Joint/Cartilage Subtle minimally impacted nondisplaced fracture of the tibial plateau laterally best appreciated on coronal image 99 of series 7 where it extends to the joint space at the level of the anterior aspect of the tibial spine. Subtle disruption of the anterior tibial cortex also noted on sagittal image 94 of series 8 and axial image 92 of series 3. Femur and fibula appear intact, as does the patella Ligaments Suboptimally assessed by CT. Muscles and Tendons Poorly evaluated by CT, but generally unremarkable. Soft tissues Large high attenuation joint effusion indicative of hemarthrosis. IMPRESSION: 1. Subtle nondisplaced minimally impacted fracture of the lateral left tibial plateau with hemarthrosis, as above. Electronically Signed   By: Trudie Reed M.D.   On: 04/06/2023 05:43   5:49 AM CT knee with subtle, non-displaced, minimall impacted fracture of  lateral tibial plateau with hemarthrosis.  5:57 AM Spoke with on call orthopedics, Dr. Christell Constant-- non-displaced so would be non-operative management.  Knee immobilizer for now.  If not able to ambulate, will need medical admission/TOC for SNF/rehab placement.  Results discussed with patient.  She lives at home with 77 year old husband, however he still works part-time so is not home consistently during the day.  As he is elderly as well, she does not feel like he will be able to assist her like she needs.  She is unhappy about possible rehab placement, but understands reasoning for such.  6:37 AM Care signed out to oncoming provider.  Pendings labs and UA.  Would attempt admission given that she does have fracture, however if hospitalist will not admit would get [facility] consult for placement.   Garlon Hatchet, PA-C 04/06/23 7829    Nicanor Alcon, April, MD 04/06/23 [phone]

## 2023-04-06 NOTE — ED Provider Notes (Signed)
Handoff from L. Allyne Gee PA. Ortho states non-operative. Lives w/husband who works. Labs pending, recommend admission.   Procedures  Procedures  ED Course / MDM   Clinical Course as of 04/06/23 9147  Sat Apr 05, 2023  2339 CT Head Wo Contrast [JR]    Clinical Course User Index [JR] Gareth Eagle, PA-C   Medical Decision Making Patient is a 72 year old female, handoff from Sharilyn Sites, pending labs, reeval, and admission to hospital, given inability to ambulate and new fracture.  Patient well-appearing on my exam, I spoke with Dr. Stann Mainland, she accepts admission of patient.  No electrolyte abnormalities, no evidence of UTI.  Amount and/or Complexity of Data Reviewed Labs: ordered. Radiology: ordered. Decision-making details documented in ED Course.  Risk Prescription drug management. Decision regarding hospitalization.        Pete Pelt, [place] 04/06/23 1035    Gwyneth Sprout, MD 04/10/23 [phone]

## 2023-04-07 DIAGNOSIS — S82142A Displaced bicondylar fracture of left tibia, initial encounter for closed fracture: Secondary | ICD-10-CM | POA: Diagnosis not present

## 2023-04-07 LAB — CBC WITH DIFFERENTIAL/PLATELET
Abs Immature Granulocytes: 0.01 10*3/uL (ref 0.00–0.07)
Basophils Absolute: 0 10*3/uL (ref 0.0–0.1)
Basophils Relative: 1 %
Eosinophils Absolute: 0.1 10*3/uL (ref 0.0–0.5)
Eosinophils Relative: 3 %
HCT: 39 % (ref 36.0–46.0)
Hemoglobin: 12.6 g/dL (ref 12.0–15.0)
Immature Granulocytes: 0 %
Lymphocytes Relative: 22 %
Lymphs Abs: 1 10*3/uL (ref 0.7–4.0)
MCH: 32.6 pg (ref 26.0–34.0)
MCHC: 32.3 g/dL (ref 30.0–36.0)
MCV: 100.8 fL — ABNORMAL HIGH (ref 80.0–100.0)
Monocytes Absolute: 0.4 10*3/uL (ref 0.1–1.0)
Monocytes Relative: 10 %
Neutro Abs: 2.9 10*3/uL (ref 1.7–7.7)
Neutrophils Relative %: 64 %
Platelets: 131 10*3/uL — ABNORMAL LOW (ref 150–400)
RBC: 3.87 MIL/uL (ref 3.87–5.11)
RDW: 14.2 % (ref 11.5–15.5)
WBC: 4.4 10*3/uL (ref 4.0–10.5)
nRBC: 0 % (ref 0.0–0.2)

## 2023-04-07 LAB — MAGNESIUM: Magnesium: 1.6 mg/dL — ABNORMAL LOW (ref 1.7–2.4)

## 2023-04-07 MED ORDER — MORPHINE SULFATE (PF) 2 MG/ML IV SOLN: 1.0000 mg | INTRAVENOUS | Status: DC | PRN

## 2023-04-07 MED ORDER — MAGNESIUM OXIDE -MG SUPPLEMENT 400 (240 MG) MG PO TABS
400.0000 mg | ORAL_TABLET | Freq: Every day | ORAL | Status: DC
  Administered 2023-04-07: 400 mg via ORAL
  Filled 2023-04-07: qty 1

## 2023-04-07 MED ORDER — ACETAMINOPHEN 500 MG PO TABS
1000.0000 mg | ORAL_TABLET | Freq: Three times a day (TID) | ORAL | Status: DC
  Administered 2023-04-07 (×2): 1000 mg via ORAL
  Filled 2023-04-07 (×2): qty 2

## 2023-04-07 MED ORDER — TORSEMIDE 20 MG PO TABS
20.0000 mg | ORAL_TABLET | Freq: Every day | ORAL | Status: DC
  Administered 2023-04-07: 20 mg via ORAL
  Filled 2023-04-07: qty 1

## 2023-04-07 MED ORDER — POTASSIUM CHLORIDE CRYS ER 20 MEQ PO TBCR: 20.0000 meq | EXTENDED_RELEASE_TABLET | Freq: Two times a day (BID) | ORAL | Status: DC

## 2023-04-07 MED ORDER — MAGNESIUM SULFATE 2 GM/50ML IV SOLN
2.0000 g | Freq: Once | INTRAVENOUS | Status: AC
  Administered 2023-04-07: 2 g via INTRAVENOUS
  Filled 2023-04-07: qty 50

## 2023-04-07 MED ORDER — ACETAMINOPHEN 500 MG PO TABS: 1000.0000 mg | ORAL_TABLET | Freq: Three times a day (TID) | ORAL | 0 refills | Status: DC

## 2023-04-07 MED ORDER — OXYCODONE HCL 5 MG PO TABS
5.0000 mg | ORAL_TABLET | Freq: Four times a day (QID) | ORAL | Status: DC | PRN
  Administered 2023-04-07: 5 mg via ORAL
  Filled 2023-04-07: qty 1

## 2023-04-07 MED ORDER — OXYCODONE HCL 5 MG PO TABS: 5.0000 mg | ORAL_TABLET | Freq: Four times a day (QID) | ORAL | 0 refills | Status: AC | PRN

## 2023-04-07 NOTE — Care Management Obs Status (Signed)
MEDICARE OBSERVATION STATUS NOTIFICATION   Patient Details  Name: Gladyse Corvin MRN: [id number] Date of Birth: [birth year]   Medicare Observation Status Notification Given:  Yes    Ronny Bacon, RN 04/07/2023, 9:50 AM

## 2023-04-07 NOTE — Progress Notes (Signed)
Physical Therapy Treatment Patient Details Name: Debbie Barry MRN: [id number] DOB: 1950/09/25 Today's Date: 04/07/2023   History of Present Illness 72 y.o. female presents to [facility] hospital on 04/05/2023 after a fall on 10/4. Pt found to have L tibial plateau fx. PMH includes HTN, DVT, CHF, GERD, hepatic steatosis, osteoporosis.    PT Comments  Pt progressing as expect or better toward goals.  Emphasis on general safety,  there ex to bil LE, stress on AAROM L Knee, transition to EOB, scooting, best sit to stand technique, pre gait activity in the RW and a squat- pivot transfer.  Pt will likely not be able to hop between surfaces with the RW consistently and will be transfering bed/chair to the w/c for mobility until she can w/bear.    If plan is discharge home, recommend the following: A little help with walking and/or transfers;A lot of help with bathing/dressing/bathroom;Assistance with cooking/housework;Assist for transportation;Help with stairs or ramp for entrance   Can travel by private vehicle     No  Equipment Recommendations  None recommended by PT    Recommendations for Other Services       Precautions / Restrictions Precautions Precautions: Fall Required Braces or Orthoses: Knee Immobilizer - Left Knee Immobilizer - Left: On when out of bed or walking Restrictions Weight Bearing Restrictions: Yes LLE Weight Bearing: Non weight bearing     Mobility  Bed Mobility Overal bed mobility: Needs Assistance Bed Mobility: Supine to Sit     Supine to sit: Supervision, HOB elevated     General bed mobility comments: cued to bridge, then pt moved LE's and came up/forward from slightly raised HOB without assist.  Effortful scoot to and away from EOB with UE's    Transfers Overall transfer level: Needs assistance Equipment used: Rolling walker (2 wheels) Transfers: Sit to/from Stand, Bed to chair/wheelchair/BSC Sit to Stand: Min assist, +2 safety/equipment     Squat  pivot transfers: Min assist (pt tired from ther ex and 2 trials of pre gait)     General transfer comment: cues for technique, feed back given on how much NOT maintaining NWB    Ambulation/Gait Ambulation/Gait assistance: Min assist Gait Distance (Feet): 1 Feet (pt unable to complete HOP TO backward and shuffle her R foot backward.) Assistive device: Rolling walker (2 wheels)         General Gait Details: Effortful hop  x2, but with little forward progression.  pt unable to hop backward to the bed, but able to shuffle her foot back.   Stairs             Wheelchair Mobility     Tilt Bed    Modified Rankin (Stroke Patients Only)       Balance Overall balance assessment: Needs assistance   Sitting balance-Leahy Scale: Good     Standing balance support: Bilateral upper extremity supported, Reliant on assistive device for balance Standing balance-Leahy Scale: Poor Standing balance comment: reliant on AD for standing                            Cognition Arousal: Alert Behavior During Therapy: WFL for tasks assessed/performed Overall Cognitive Status: Within Functional Limits for tasks assessed                                          Exercises Other  Exercises Other Exercises: Assisted L knee flex/ext x10 reps 0-60+ deg.  AROM for R LE x10 Other Exercises: SLR x10 reps bil  Active R LE, AA L LE    General Comments General comments (skin integrity, edema, etc.): VSS      Pertinent Vitals/Pain Pain Assessment Pain Assessment: Faces Faces Pain Scale: Hurts little more Pain Location: L knee Pain Descriptors / Indicators: Aching Pain Intervention(s): Monitored during session, Limited activity within patient's tolerance    Home Living                          Prior Function            PT Goals (current goals can now be found in the care plan section) Acute Rehab PT Goals Patient Stated Goal: to return home,  improve maintenance of WB restrictions when transferring PT Goal Formulation: With patient Time For Goal Achievement: 04/20/23 Potential to Achieve Goals: Fair Progress towards PT goals: Progressing toward goals    Frequency    Min 1X/week      PT Plan      Co-evaluation              AM-PAC PT "6 Clicks" Mobility   Outcome Measure  Help needed turning from your back to your side while in a flat bed without using bedrails?: None Help needed moving from lying on your back to sitting on the side of a flat bed without using bedrails?: A Little Help needed moving to and from a bed to a chair (including a wheelchair)?: A Little Help needed standing up from a chair using your arms (e.g., wheelchair or bedside chair)?: A Little Help needed to walk in hospital room?: Total Help needed climbing 3-5 steps with a railing? : Total 6 Click Score: 15    End of Session Equipment Utilized During Treatment: Left knee immobilizer;Gait belt Activity Tolerance: Patient tolerated treatment well Patient left: in chair;with call bell/phone within reach Nurse Communication: Mobility status PT Visit Diagnosis: Other abnormalities of gait and mobility (R26.89);Pain Pain - Right/Left: Left Pain - part of body: Knee     Time: 1610-9604 PT Time Calculation (min) (ACUTE ONLY): 27 min  Charges:    $Therapeutic Exercise: 8-22 mins $Therapeutic Activity: 8-22 mins PT General Charges $$ ACUTE PT VISIT: 1 Visit                     04/07/2023  Jacinto Halim., PT Acute Rehabilitation Services [phone]  (office)   Debbie Barry 04/07/2023, 11:33 AM

## 2023-04-07 NOTE — Discharge Summary (Signed)
Physician Discharge Summary  Debbie Barry VQQ:[id number] DOB: 1951/04/10 DOA: 04/05/2023  PCP: Dorothyann Peng, MD  Admit date: 04/05/2023 Discharge date: 04/07/2023  Admitted From: Home Discharge disposition: Home with home health  Recommendations at discharge:  Pain control with scheduled Tylenol, as needed oxycodone Nonweightbearing on left lower extremity. Use knee immobilizer when out of bed Follow-up with orthopedics as an outpatient   Brief narrative: Debbie Barry is a 72 y.o. female with PMH significant for HTN, systolic CHF, PMVT/torsades/cardiac arrest/nonobstructive CAD, PAD, h/o osteomyelitis of toe, h/o DVT 10/4, patient was at home when she lost her balance, fell.  She did hit her head and busted lip.  Husband came and picked her up.  She noticed left knee pain which progressively worsened and hence was brought to the ED 10/5.    Hemodynamically stable in the ED Skeletal survey done CT  head: no acute finding. Empty sella  CT cevical spine: no acute findings. Multilevel severe degenerative changes of the spine with grade 1 anterolisthesis of C2 on C3, C3 on C4, C4 on C5. Associated severe neural foraminal stenosis bilaterally at the C4-C5 lev. CT left knee: Subtle nondisplaced minimally impacted fracture of the lateral left tibial plateau with hemarthrosis, as above.  Orthopedics consulted Admitted to [facility]  Subjective: Patient was seen and examined this morning.  Pleasant elderly African-American female.  Sitting up in recliner.  Not in distress.  Just worked with PT.  She is in nonweightbearing status on the left lower extremity and hands essentially wheelchair-bound. Not requiring IV pain medicines. Chart reviewed Hemodynamically stable Labs from this morning unremarkable  Hospital course: Closed fracture of left tibial plateau Secondary to mechanical fall  Imaging as above Orthopedic consulted. Noted a plan of nonoperative management Recommended  nonweightbearing in left lower extremity.  Knee immobilizer to be used out of bed PT eval obtained.  SNF recommended but patient prefers to go home. Home with home health ordered. Pain control -I have scheduled her on Tylenol 1 g 3 times daily along with as needed oxycodone.  Eliquis resumed To follow-up with orthopedics as an outpatient  Chronic systolic CHF -recovered H/o cardiac arrest due to polymorphic VT/torsades due to electrolyte arrangements-05/2021 Hypomagnesemia At that time, cardiac cath showed nonobstructive CAD.  Cardiogram showed EF of 25 to 30% with recovered to 55 to 60% in March 2023 Currently euvolemic on exam PTA meds-  Toprol 25 mg daily, torsemide 20 mg daily, Entresto 24-26 mg twice daily, Aldactone 12.5 mg daily Currently continued on Toprol, Entresto and Aldactone.  Resume torsemide as well. On chronic potassium and magnesium supplement.  Resume the same.  Manage level low at 1.6 today.  IV replacement given.   Recent Labs  Lab 04/06/23 0624 04/06/23 1524  K 3.5  --   MG  --  1.6*   H/o DVT Reportedly had 1 episode of DVT in right femoral vein in 2022 Currently continued on long-term Eliquis 5 mg twice daily.    H/o UGIB Continue protonix    Major depression Continue zoloft   Goals of care   Code Status: Full Code   Wounds: Wound / Incision (Open or Dehisced) 05/07/21 Non-pressure wound Sacrum Medial;Posterior;Mid small 2 x 1 wound noted to sacrum/coccyx area pink in color. (Active)  Date First Assessed/Time First Assessed: 05/07/21 1400   Wound Type: Non-pressure wound  Location: Sacrum  Location Orientation: Medial;Posterior;Mid  Wound Description (Comments): small 2 x 1 wound noted to sacrum/coccyx area pink in color.  Present .Marland KitchenMarland Kitchen  Assessments 05/07/2021  2:00 PM 05/22/2021  9:00 AM  Dressing Type Foam - Lift dressing to assess site every shift Foam - Lift dressing to assess site every shift  Dressing Changed -- Reinforced  Dressing Status  Clean;Dry;Intact Intact;Dry;Clean  Dressing Change Frequency Every 3 days --  Site / Wound Assessment Clean;Dry Dry;Clean;Pink  % Wound base Red or Granulating 100% --  Peri-wound Assessment Intact --  Wound Length (cm) 2 cm --  Wound Width (cm) 1 cm --  Wound Surface Area (cm^2) 2 cm^2 --  Margins Unattached edges (unapproximated) --  Drainage Amount None --     No associated orders.     Wound / Incision (Open or Dehisced) 05/07/21 Non-pressure wound Buttocks Left;Posterior small 4 x .05 cm wound noted to lt. buttock with top layer skin missing (documented as abrasion on admission) (Active)  Date First Assessed/Time First Assessed: 05/07/21 1400   Wound Type: Non-pressure wound  Location: Buttocks  Location Orientation: Left;Posterior  Wound Description (Comments): small 4 x .05 cm wound noted to lt. buttock with top layer skin missing (d...    Assessments 05/07/2021  2:00 PM 05/22/2021  9:00 AM  Dressing Type Foam - Lift dressing to assess site every shift Foam - Lift dressing to assess site every shift  Dressing Changed -- Reinforced  Dressing Status Clean;Dry;Intact Intact;Dry;Clean  Dressing Change Frequency Every 3 days PRN  % Wound base Red or Granulating 100% --  Peri-wound Assessment Intact --  Wound Length (cm) 4 cm --  Wound Width (cm) 0.05 cm --  Wound Surface Area (cm^2) 0.2 cm^2 --  Margins Unattached edges (unapproximated) --  Drainage Amount None --     No associated orders.    Discharge Exam:   Vitals:   04/06/23 2347 04/07/23 0454 04/07/23 0838 04/07/23 0842  BP: 131/72 121/70 133/78 111/62  Pulse: 75 64 69 67  Resp: 17 16 17 17   Temp: 98.6 F (37 C) 98.3 F (36.8 C) 98.5 F (36.9 C) 98.2 F (36.8 C)  TempSrc: Oral Oral Oral   SpO2: 95% 99% 95% 96%  Weight:      Height:        Body mass index is 32.88 kg/m.  General exam: Pleasant, elderly African-American female.  Pain controlled at rest. Skin: No rashes, lesions or ulcers. HEENT: Atraumatic,  normocephalic, no obvious bleeding Lungs: Clear to auscultation bilaterally CVS: Regular rate and rhythm, no murmur GI/Abd soft, nontender, nondistended, bowel sound present CNS: Alert, awake, oriented x 3 Psychiatry: Mood appropriate  Extremities: No pedal edema, left knee has immobilizer in place.  Follow ups:    Follow-up Information     [facility] Follow up.   Specialty: Home Health Services Contact information: [address] [place] [address] [place] [address] [phone]         Dorothyann Peng, MD Follow up.   Specialty: Internal Medicine Contact information: [address] [place] [address] [phone]                 Discharge Instructions:   Discharge Instructions     Call MD for:  difficulty breathing, headache or visual disturbances   Complete by: As directed    Call MD for:  extreme fatigue   Complete by: As directed    Call MD for:  hives   Complete by: As directed    Call MD for:  persistant dizziness or light-headedness   Complete by: As directed  Call MD for:  persistant nausea and vomiting   Complete by: As directed    Call MD for:  severe uncontrolled pain   Complete by: As directed    Call MD for:  temperature >100.4   Complete by: As directed    Diet - low sodium heart healthy   Complete by: As directed    Discharge instructions   Complete by: As directed    Recommendations at discharge:   Pain control with scheduled Tylenol, as needed oxycodone  Nonweightbearing on left lower extremity.  Use knee immobilizer when out of bed  Follow-up with orthopedics as an outpatient  General discharge instructions: Follow with Primary MD Dorothyann Peng, MD in 7 days  Please request your PCP  to go over your hospital tests, procedures, radiology results at the follow up. Please get your medicines reviewed and adjusted.  Your PCP may decide to repeat certain labs or tests as needed. Do not drive,  operate heavy machinery, perform activities at heights, swimming or participation in water activities or provide baby sitting services if your were admitted for syncope or siezures until you have seen by Primary MD or a Neurologist and advised to do so again. [place] Controlled Substance Reporting System database was reviewed. Do not drive, operate heavy machinery, perform activities at heights, swim, participate in water activities or provide baby-sitting services while on medications for pain, sleep and mood until your outpatient physician has reevaluated you and advised to do so again.  You are strongly recommended to comply with the dose, frequency and duration of prescribed medications. Activity: As tolerated with Full fall precautions use walker/cane & assistance as needed Avoid using any recreational substances like cigarette, tobacco, alcohol, or non-prescribed drug. If you experience worsening of your admission symptoms, develop shortness of breath, life threatening emergency, suicidal or homicidal thoughts you must seek medical attention immediately by calling 911 or calling your MD immediately  if symptoms less severe. You must read complete instructions/literature along with all the possible adverse reactions/side effects for all the medicines you take and that have been prescribed to you. Take any new medicine only after you have completely understood and accepted all the possible adverse reactions/side effects.  Wear Seat belts while driving. You were cared for by a hospitalist during your hospital stay. If you have any questions about your discharge medications or the care you received while you were in the hospital after you are discharged, you can call the unit and ask to speak with the hospitalist or the covering physician. Once you are discharged, your primary care physician will handle any further medical issues. Please note that NO REFILLS for any discharge medications will be  authorized once you are discharged, as it is imperative that you return to your primary care physician (or establish a relationship with a primary care physician if you do not have one).   Increase activity slowly   Complete by: As directed        Discharge Medications:   Allergies as of 04/07/2023       Reactions   Carvedilol Diarrhea        Medication List     TAKE these medications    acetaminophen 500 MG tablet Commonly known as: TYLENOL Take 2 tablets (1,000 mg total) by mouth 3 (three) times daily. What changed:  medication strength how much to take when to take this reasons to take this   Eliquis 5 MG Tabs tablet Generic drug: apixaban TAKE 1 TABLET(5  MG) BY MOUTH TWICE DAILY What changed: See the new instructions.   Entresto 24-26 MG Generic drug: sacubitril-valsartan Take 1 tablet by mouth 2 (two) times daily.   Magnesium 500 MG Tabs Take 1 tablet by mouth daily.   metoprolol succinate 25 MG 24 hr tablet Commonly known as: Toprol XL Take 1 tablet (25 mg total) by mouth daily.   multivitamin with minerals Tabs tablet Take 1 tablet by mouth daily.   OVER THE COUNTER MEDICATION Take 1 capsule by mouth daily. Patient takes a circulation pill from [place], last took on Thursday.   oxyCODONE 5 MG immediate release tablet Commonly known as: Oxy IR/ROXICODONE Take 1 tablet (5 mg total) by mouth every 6 (six) hours as needed for up to 5 days for moderate pain.   pantoprazole 40 MG tablet Commonly known as: PROTONIX Take 1 tablet (40 mg total) by mouth daily.   potassium chloride SA 20 MEQ tablet Commonly known as: KLOR-CON M TAKE 1 TABLET(20 MEQ) BY MOUTH TWICE DAILY What changed: See the new instructions.   sertraline 50 MG tablet Commonly known as: Zoloft Take 1 tablet (50 mg total) by mouth daily.   spironolactone 25 MG tablet Commonly known as: ALDACTONE Take 0.5 tablets (12.5 mg total) by mouth daily.   sucralfate 1 g tablet Commonly known  as: CARAFATE TAKE 1 TABLET(1 GRAM) BY MOUTH TWICE DAILY prn   tetrahydrozoline 0.05 % ophthalmic solution Place 1 drop into both eyes daily.   torsemide 20 MG tablet Commonly known as: DEMADEX Take 1 tablet (20 mg total) by mouth daily as needed (swelling).   Vitamin D3 125 MCG (5000 UT) Caps Take 5,000 Units by mouth daily.         The results of significant diagnostics from this hospitalization (including imaging, microbiology, ancillary and laboratory) are listed below for reference.    Procedures and Diagnostic Studies:   DG Tibia/Fibula Left  Result Date: 04/06/2023 CLINICAL DATA:  Pain after fall EXAM: LEFT TIBIA AND FIBULA - 2 VIEW COMPARISON:  None Available. FINDINGS: Osteopenia. No fracture or dislocation. Preserved adjacent joint spaces. Mild soft tissue swelling. IMPRESSION: Osteopenia.  No acute osseous abnormality Electronically Signed   By: Karen Kays M.D.   On: 04/06/2023 11:19   CT Knee Left Wo Contrast  Result Date: 04/06/2023 CLINICAL DATA:  72 year old female with history of trauma from a recent fall. Left knee pain. EXAM: CT OF THE LEFT KNEE WITHOUT CONTRAST TECHNIQUE: Multidetector CT imaging of the left knee was performed according to the standard protocol. Multiplanar CT image reconstructions were also generated. RADIATION DOSE REDUCTION: This exam was performed according to the departmental dose-optimization program which includes automated exposure control, adjustment of the mA and/or kV according to patient size and/or use of iterative reconstruction technique. COMPARISON:  Left knee radiograph 04/05/2023. FINDINGS: Bones/Joint/Cartilage Subtle minimally impacted nondisplaced fracture of the tibial plateau laterally best appreciated on coronal image 99 of series 7 where it extends to the joint space at the level of the anterior aspect of the tibial spine. Subtle disruption of the anterior tibial cortex also noted on sagittal image 94 of series 8 and axial  image 92 of series 3. Femur and fibula appear intact, as does the patella Ligaments Suboptimally assessed by CT. Muscles and Tendons Poorly evaluated by CT, but generally unremarkable. Soft tissues Large high attenuation joint effusion indicative of hemarthrosis. IMPRESSION: 1. Subtle nondisplaced minimally impacted fracture of the lateral left tibial plateau with hemarthrosis, as above. Electronically Signed  By: Trudie Reed M.D.   On: 04/06/2023 05:43   CT Head Wo Contrast  Result Date: 04/06/2023 CLINICAL DATA:  Polytrauma, blunt EXAM: CT HEAD WITHOUT CONTRAST CT CERVICAL SPINE WITHOUT CONTRAST TECHNIQUE: Multidetector CT imaging of the head and cervical spine was performed following the standard protocol without intravenous contrast. Multiplanar CT image reconstructions of the cervical spine were also generated. RADIATION DOSE REDUCTION: This exam was performed according to the departmental dose-optimization program which includes automated exposure control, adjustment of the mA and/or kV according to patient size and/or use of iterative reconstruction technique. COMPARISON:  MRI head 04/20/2021, CT head 02/22/2021 FINDINGS: CT HEAD FINDINGS Brain: Cerebral ventricle sizes are concordant with the degree of cerebral volume loss. Patchy and confluent areas of decreased attenuation are noted throughout the deep and periventricular white matter of the cerebral hemispheres bilaterally, compatible with chronic microvascular ischemic disease. No evidence of large-territorial acute infarction. No parenchymal hemorrhage. No mass lesion. No extra-axial collection. No mass effect or midline shift. No hydrocephalus. Basilar cisterns are patent. Empty sella. Vascular: No hyperdense vessel. Skull: No acute fracture or focal lesion. Sinuses/Orbits: Paranasal sinuses and mastoid air cells are clear. The orbits are unremarkable. Other: None. CT CERVICAL SPINE FINDINGS Alignment: Grade 1 anterolisthesis of C2 on C3, C3  on C4, C4 on C5. Skull base and vertebrae: Multilevel severe degenerative changes of the spine with associated severe neural foraminal stenosis bilaterally at the C4-C5 level. No severe osseous central canal stenosis. No acute fracture. No aggressive appearing focal osseous lesion or focal pathologic process. Soft tissues and spinal canal: No prevertebral fluid or swelling. No visible canal hematoma. Upper chest: Unremarkable. Other: None. IMPRESSION: 1. No acute intracranial abnormality. 2. No acute displaced fracture or traumatic listhesis of the cervical spine. 3. Empty sella. Findings is often a normal anatomic variant but can be associated with idiopathic intracranial hypertension (pseudotumor cerebri). 4. Multilevel severe degenerative changes of the spine with grade 1 anterolisthesis of C2 on C3, C3 on C4, C4 on C5. Associated severe neural foraminal stenosis bilaterally at the C4-C5 lev. Electronically Signed   By: Tish Frederickson M.D.   On: 04/06/2023 00:34   CT Cervical Spine Wo Contrast  Result Date: 04/06/2023 CLINICAL DATA:  Polytrauma, blunt EXAM: CT HEAD WITHOUT CONTRAST CT CERVICAL SPINE WITHOUT CONTRAST TECHNIQUE: Multidetector CT imaging of the head and cervical spine was performed following the standard protocol without intravenous contrast. Multiplanar CT image reconstructions of the cervical spine were also generated. RADIATION DOSE REDUCTION: This exam was performed according to the departmental dose-optimization program which includes automated exposure control, adjustment of the mA and/or kV according to patient size and/or use of iterative reconstruction technique. COMPARISON:  MRI head 04/20/2021, CT head 02/22/2021 FINDINGS: CT HEAD FINDINGS Brain: Cerebral ventricle sizes are concordant with the degree of cerebral volume loss. Patchy and confluent areas of decreased attenuation are noted throughout the deep and periventricular white matter of the cerebral hemispheres bilaterally,  compatible with chronic microvascular ischemic disease. No evidence of large-territorial acute infarction. No parenchymal hemorrhage. No mass lesion. No extra-axial collection. No mass effect or midline shift. No hydrocephalus. Basilar cisterns are patent. Empty sella. Vascular: No hyperdense vessel. Skull: No acute fracture or focal lesion. Sinuses/Orbits: Paranasal sinuses and mastoid air cells are clear. The orbits are unremarkable. Other: None. CT CERVICAL SPINE FINDINGS Alignment: Grade 1 anterolisthesis of C2 on C3, C3 on C4, C4 on C5. Skull base and vertebrae: Multilevel severe degenerative changes of the spine with associated severe neural  foraminal stenosis bilaterally at the C4-C5 level. No severe osseous central canal stenosis. No acute fracture. No aggressive appearing focal osseous lesion or focal pathologic process. Soft tissues and spinal canal: No prevertebral fluid or swelling. No visible canal hematoma. Upper chest: Unremarkable. Other: None. IMPRESSION: 1. No acute intracranial abnormality. 2. No acute displaced fracture or traumatic listhesis of the cervical spine. 3. Empty sella. Findings is often a normal anatomic variant but can be associated with idiopathic intracranial hypertension (pseudotumor cerebri). 4. Multilevel severe degenerative changes of the spine with grade 1 anterolisthesis of C2 on C3, C3 on C4, C4 on C5. Associated severe neural foraminal stenosis bilaterally at the C4-C5 lev. Electronically Signed   By: Tish Frederickson M.D.   On: 04/06/2023 00:34   DG Hip Unilat With Pelvis 2-3 Views Left  Result Date: 04/05/2023 CLINICAL DATA:  Fall EXAM: DG HIP (WITH OR WITHOUT PELVIS) 2-3V LEFT COMPARISON:  None Available. FINDINGS: Surgical changes of left total hip arthroplasty are identified. Normal alignment. No acute fracture or dislocation. Vascular calcifications noted. IMPRESSION: 1. Left total hip arthroplasty. No acute fracture or dislocation. Electronically Signed   By:  Helyn Numbers M.D.   On: 04/05/2023 23:09   DG Knee Complete 4 Views Left  Result Date: 04/05/2023 CLINICAL DATA:  Left knee pain.  Recent fall. EXAM: LEFT KNEE - COMPLETE 4+ VIEW COMPARISON:  None Available. FINDINGS: The bones are subjectively under mineralized. No evidence of fracture. No dislocation. There is a knee joint effusion. Mild tibiofemoral joint space narrowing. Mild generalized soft tissue edema. IMPRESSION: 1. Moderate sized knee joint effusion. 2. No evidence of fracture or dislocation. Osteopenia/osteoporosis. If there is persistent clinical concern for fracture, consider CT. Electronically Signed   By: Narda Rutherford M.D.   On: 04/05/2023 22:12     Labs:   Basic Metabolic Panel: Recent Labs  Lab 04/06/23 0624 04/06/23 1524  NA 139  --   K 3.5  --   CL 104  --   CO2 28  --   GLUCOSE 83  --   BUN <5*  --   CREATININE 0.65  --   CALCIUM 8.0*  --   MG  --  1.6*   GFR Estimated Creatinine Clearance: 63.1 mL/min (by C-G formula based on SCr of 0.65 mg/dL). Liver Function Tests: No results for input(s): "AST", "ALT", "ALKPHOS", "BILITOT", "PROT", "ALBUMIN" in the last 168 hours. No results for input(s): "LIPASE", "AMYLASE" in the last 168 hours. No results for input(s): "AMMONIA" in the last 168 hours. Coagulation profile No results for input(s): "INR", "PROTIME" in the last 168 hours.  CBC: Recent Labs  Lab 04/06/23 0624 04/07/23 0655  WBC 5.6 4.4  NEUTROABS 4.2 2.9  HGB 13.1 12.6  HCT 39.7 39.0  MCV 100.8* 100.8*  PLT 145* 131*   Cardiac Enzymes: No results for input(s): "CKTOTAL", "CKMB", "CKMBINDEX", "TROPONINI" in the last 168 hours. BNP: Invalid input(s): "POCBNP" CBG: Recent Labs  Lab 04/06/23 0815 04/06/23 0902 04/06/23 1028 04/06/23 1246  GLUCAP 84 98 93 90   D-Dimer No results for input(s): "DDIMER" in the last 72 hours. Hgb A1c No results for input(s): "HGBA1C" in the last 72 hours. Lipid Profile No results for input(s):  "CHOL", "HDL", "LDLCALC", "TRIG", "CHOLHDL", "LDLDIRECT" in the last 72 hours. Thyroid function studies No results for input(s): "TSH", "T4TOTAL", "T3FREE", "THYROIDAB" in the last 72 hours.  Invalid input(s): "FREET3" Anemia work up No results for input(s): "VITAMINB12", "FOLATE", "FERRITIN", "TIBC", "IRON", "RETICCTPCT" in the last  72 hours. Microbiology No results found for this or any previous visit (from the past 240 hour(s)).  Time coordinating discharge: 45 minutes  Signed: Kamoni Depree  [facility] 04/07/2023, 2:16 PM

## 2023-04-07 NOTE — Progress Notes (Signed)
Patient was sitting on the edge of her bed dressed to leave. As the floors PRN Nurse until 11 pm I asked the patient was she waiting on a ride I saw that she was up for discharge as well. Patient stated she had been waiting on [facility] and had been waiting for a while. Patient stated she called her husband to come get her and that he was on the way. Once her Husband arrived I help patient into the wheel chair and down to the car. Patient tolerated the move well .

## 2023-04-07 NOTE — Progress Notes (Signed)
Discharge instructions (including medications) discussed with and copy provided to patient/caregiver Patient is going home by [facility].

## 2023-04-07 NOTE — TOC CAGE-AID Note (Signed)
Transition of Care [facility]) - CAGE-AID Screening  Patient Details  Name: Debbie Barry MRN: [id number] Date of Birth: 1951/05/04  Clinical Narrative:  Patient denies any substance abuse, does endorse occasional glass of wine at home but never in excess. Patient denies need for substance abuse resources at this time.  CAGE-AID Screening:   Have You Ever Felt You Ought to Cut Down on Your Drinking or Drug Use?: No Have People Annoyed You By Critizing Your Drinking Or Drug Use?: No Have You Felt Bad Or Guilty About Your Drinking Or Drug Use?: No Have You Ever Had a Drink or Used Drugs First Thing In The Morning to Steady Your Nerves or to Get Rid of a Hangover?: No CAGE-AID Score: 0  Substance Abuse Education Offered: No

## 2023-04-07 NOTE — TOC Initial Note (Addendum)
Transition of Care (TOC) - Initial/Assessment Note   Spoke to patient at bedside. Discussed PT recommendations for SNF at discharge. Patient declined SNF at this time. Patient from home with husband. Husband works 4 hours a day but states her daughter lives close by and does not work. Daughter can stay with her when husband is at work.   Patient has had [facility] in the past and would like them again if possible. Lynette with [facility] can accept referral. NCM secure chatted MD for orders and face to face.   Order for HHPT entered by MD. Haywood Lasso with [facility] aware.   1420 Patient spoke to husband, he prefers patient to go home by ambulance. NCM confirmed address with patient, husband is at the home waiting on her arrival. NCM explained to patient [facility] will file with her insurance, she may receive a bill for ambulance transport. PAtient voiced understanding. [facility] paperwork in chart. NCM secure chatted nurse nurse to let NCM know when to call [facility]   Lynette with [facility] aware discharge is today   Bedside nurse giving Mag Iv , infusion will be done at 5 pm , then patient will be ready for discharge. Called [facility]  with time ready , paperwork on chart.  Patient Details  Name: Debbie Barry MRN: [id number] Date of Birth: October 14, 1950  Transition of Care [facility]) CM/SW Contact:    Kingsley Plan, RN Phone Number: 04/07/2023, 12:01 PM  Clinical Narrative:                   Expected Discharge Plan: Skilled Nursing Facility Barriers to Discharge: Continued Medical Work up   Patient Goals and CMS Choice Patient states their goals for this hospitalization and ongoing recovery are:: to return to home CMS Medicare.gov Compare Post Acute Care list provided to:: Patient Choice offered to / list presented to : Patient      Expected Discharge Plan and Services   Discharge Planning Services: CM Consult Post Acute Care Choice: Home Health Living arrangements for the past 2 months: Single  Family Home                 DME Arranged: N/A         HH Arranged:  (Asked MD for orders and face to face) HH Agency: [facility] Date [facility] Agency Contacted: 04/07/23 Time HH Agency Contacted: 1155 Representative spoke with at [facility] Agency: Haywood Lasso  Prior Living Arrangements/Services Living arrangements for the past 2 months: Single Family Home Lives with:: Spouse Patient language and need for interpreter reviewed:: Yes Do you feel safe going back to the place where you live?: Yes      Need for Family Participation in Patient Care: Yes (Comment) Care giver support system in place?: Yes (comment) Current home services: DME Criminal Activity/Legal Involvement Pertinent to Current Situation/Hospitalization: No - Comment as needed  Activities of Daily Living   ADL Screening (condition at time of admission) Independently performs ADLs?: Yes (appropriate for developmental age) Is the patient deaf or have difficulty hearing?: No Does the patient have difficulty seeing, even when wearing glasses/contacts?: No Does the patient have difficulty concentrating, remembering, or making decisions?: No  Permission Sought/Granted   Permission granted to share information with : Yes, Verbal Permission Granted     Permission granted to share info w AGENCY: [facility]        Emotional Assessment Appearance:: Appears stated age Attitude/Demeanor/Rapport: Engaged Affect (typically observed): Accepting Orientation: : Oriented to Self, Oriented to Place, Oriented to  Time, Oriented to Situation Alcohol / Substance Use: Not Applicable Psych Involvement: No (comment)  Admission diagnosis:  Mobility impaired [Z74.09] Fall, initial encounter [W19.XXXA] Closed fracture of left tibial plateau, initial encounter [S82.142A] Left medial tibial plateau fracture [S82.132A] Patient Active Problem List   Diagnosis Date Noted   CAD (coronary artery disease) 04/06/2023   Closed fracture of left tibial  plateau 04/06/2023   Gastroesophageal reflux disease without esophagitis 01/18/2023   Primary insomnia 01/18/2023   Major depression, recurrent, chronic (HCC) 12/31/2022   Vitamin D deficiency disease 08/03/2022   Weight gain 08/03/2022   Class 1 obesity due to excess calories with serious comorbidity and body mass index (BMI) of 31.0 to 31.9 in adult 08/03/2022   Hypertensive heart disease with chronic systolic congestive heart failure (HCC) 07/25/2022   Cardiomyopathy (HCC)    Nonischemic cardiomyopathy (HCC)    DVT (deep venous thrombosis) (HCC) 05/03/2021   Renal infarct (HCC) 05/03/2021   Lumbar spinal stenosis 05/03/2021   Anasarca 05/03/2021   Weakness of left lower extremity 04/20/2021   Asymptomatic bacteriuria 04/20/2021   Prolonged QT interval 04/20/2021   Gastritis 03/31/2021   Iron overload 02/13/2021   Neuropathy 02/13/2021   Elevated LFTs 02/13/2021   Weight loss, non-intentional 02/13/2021   Falls frequently 02/13/2021   GI bleed 08/13/2020   UGIB (upper gastrointestinal bleed) 08/12/2020   Depression 08/12/2020   Essential hypertension, benign 03/23/2019   PCP:  Dorothyann Peng, MD Pharmacy:   [facility] [phone] - [place], [place] - [address] AT [facility] [address] [facility] [address] [place] [address] Phone: [phone] Fax: [phone]  [facility] [place], [place] - [place] [address] [place] [address] Phone: [phone] Fax: [phone]  [facility] [address] [place] [address] Phone: [phone] Fax: [phone]     Social Determinants of Health (SDOH) Social History: SDOH Screenings   Food Insecurity: No Food Insecurity (04/06/2023)  Housing: Low Risk  (04/06/2023)  Transportation Needs: No Transportation Needs (04/06/2023)  Utilities: Not At Risk (04/06/2023)  Depression (PHQ2-9): High Risk (12/31/2022)  Financial  Resource Strain: Low Risk  (06/13/2022)  Physical Activity: Inactive (06/13/2022)  Social Connections: Socially Integrated (05/10/2022)   [facility] from [facility]  Stress: No Stress Concern Present (06/13/2022)  Tobacco Use: Low Risk  (04/05/2023)   SDOH Interventions:     Readmission Risk Interventions     No data to display

## 2023-04-07 NOTE — Evaluation (Signed)
Occupational Therapy Evaluation Patient Details Name: Debbie Barry MRN: [id number] DOB: 1950-09-29 Today's Date: 04/07/2023   History of Present Illness 72 y.o. female presents to [facility] hospital on 04/05/2023 after a fall on 10/4. Pt found to have L tibial plateau fx. PMH includes HTN, DVT, CHF, GERD, hepatic steatosis, osteoporosis.   Clinical Impression   Patient presenting with generalized weakness, and need for increased assist in order to complete ADLs. Patient mod A for ADLs and min to mod A for functional mobility if attempting to progress further than pivoting away from the bed. Education provided to patient with regard to transfers, providing clear direction to family so they can provide appopriate management, and overall energy conservation. HHOT recommended at discharge as patient is declining further intensive therapies at a facility. OT will continue to follow acutely.      If plan is discharge home, recommend the following: A lot of help with walking and/or transfers;A lot of help with bathing/dressing/bathroom;Assistance with cooking/housework;Assist for transportation;Help with stairs or ramp for entrance    Functional Status Assessment  Patient has had a recent decline in their functional status and demonstrates the ability to make significant improvements in function in a reasonable and predictable amount of time.  Equipment Recommendations  None recommended by OT    Recommendations for Other Services       Precautions / Restrictions Precautions Precautions: Fall Required Braces or Orthoses: Knee Immobilizer - Left Knee Immobilizer - Left: On when out of bed or walking (able to perform AROM when resting in bed per ortho) Restrictions Weight Bearing Restrictions: Yes LLE Weight Bearing: Non weight bearing      Mobility Bed Mobility Overal bed mobility: Needs Assistance Bed Mobility: Supine to Sit, Sit to Supine     Supine to sit: Supervision, HOB  elevated Sit to supine: Min assist   General bed mobility comments: Min A to return BLEs back to bed    Transfers Overall transfer level: Needs assistance Equipment used: Rolling walker (2 wheels) Transfers: Sit to/from Stand, Bed to chair/wheelchair/BSC Sit to Stand: Min assist           General transfer comment: Min A to come into standing, with OT holding LLE off of floor to maintain status, patient able to pivot on RLE to inch up towards [facility], however unable to progress further, patient with poor eccentric control when attempting to sit      Balance Overall balance assessment: Needs assistance Sitting-balance support: No upper extremity supported, Feet supported Sitting balance-Leahy Scale: Good     Standing balance support: Bilateral upper extremity supported, Reliant on assistive device for balance Standing balance-Leahy Scale: Poor Standing balance comment: reliant on AD for standing                           ADL either performed or assessed with clinical judgement   ADL Overall ADL's : Needs assistance/impaired Eating/Feeding: Set up;Sitting   Grooming: Set up;Sitting   Upper Body Bathing: Contact guard assist;Sitting   Lower Body Bathing: Moderate assistance;Sitting/lateral leans;Sit to/from stand;Maximal assistance   Upper Body Dressing : Contact guard assist;Sitting   Lower Body Dressing: Moderate assistance;Maximal assistance;Sitting/lateral leans;Sit to/from stand   Toilet Transfer: Moderate assistance;Stand-pivot;BSC/3in1;Rolling walker (2 wheels) Toilet Transfer Details (indicate cue type and reason): simulated Toileting- Clothing Manipulation and Hygiene: Moderate assistance;Maximal assistance;Sitting/lateral lean;Sit to/from stand       Functional mobility during ADLs: Moderate assistance;Cueing for safety;Cueing for sequencing;Rolling walker (2 wheels)  General ADL Comments: Patient presenting with generalized weakness, and need for  increased assist in order to complete ADLs. Patient mod A for ADLs and min to mod A for functional mobility if attempting to progress further than pivoting away from the bed. Education provided to patient with regard to transfers, providing clear direction to family so they can provide appopriate management, and overall energy conservation. HHOT recommended at discharge as patient is declining further intensive therapies at a facility. OT will continue to follow acutely.     Vision Baseline Vision/History: 1 Wears glasses (Readers) Ability to See in Adequate Light: 0 Adequate Patient Visual Report: No change from baseline Vision Assessment?: No apparent visual deficits     Perception Perception: Not tested       Praxis Praxis: Not tested       Pertinent Vitals/Pain Pain Assessment Pain Assessment: Faces Faces Pain Scale: Hurts little more Pain Location: L knee Pain Descriptors / Indicators: Aching Pain Intervention(s): Limited activity within patient's tolerance, Monitored during session, Repositioned     Extremity/Trunk Assessment Upper Extremity Assessment Upper Extremity Assessment: Generalized weakness   Lower Extremity Assessment Lower Extremity Assessment: Defer to PT evaluation   Cervical / Trunk Assessment Cervical / Trunk Assessment: Normal   Communication Communication Communication: No apparent difficulties   Cognition Arousal: Alert Behavior During Therapy: WFL for tasks assessed/performed Overall Cognitive Status: Within Functional Limits for tasks assessed                                       General Comments  VSS    Exercises     Shoulder Instructions      Home Living Family/patient expects to be discharged to:: Private residence Living Arrangements: Spouse/significant other Available Help at Discharge: Family Type of Home: House Home Access: Level entry     Home Layout: One level     Bathroom Shower/Tub: Walk-in Programme researcher, broadcasting/film/video: Standard Bathroom Accessibility: No   Home Equipment: Agricultural consultant (2 wheels);Cane - single point;BSC/3in1;Wheelchair - manual;Crutches;Shower seat          Prior Functioning/Environment Prior Level of Function : Independent/Modified Independent             Mobility Comments: ambulatory with PRN use of SPC, driving ADLs Comments: independent        OT Problem List: Decreased strength;Decreased range of motion;Decreased activity tolerance;Impaired balance (sitting and/or standing);Decreased coordination;Decreased safety awareness;Decreased knowledge of use of DME or AE;Decreased knowledge of precautions;Pain      OT Treatment/Interventions: Self-care/ADL training;Therapeutic exercise;Energy conservation;DME and/or AE instruction;Manual therapy;Patient/family education;Balance training;Therapeutic activities    OT Goals(Current goals can be found in the care plan section) Acute Rehab OT Goals Patient Stated Goal: to go home OT Goal Formulation: With patient Time For Goal Achievement: 04/21/23 Potential to Achieve Goals: Fair  OT Frequency: Min 1X/week    Co-evaluation              AM-PAC OT "6 Clicks" Daily Activity     Outcome Measure Help from another person eating meals?: A Little Help from another person taking care of personal grooming?: A Little Help from another person toileting, which includes using toliet, bedpan, or urinal?: A Lot Help from another person bathing (including washing, rinsing, drying)?: A Lot Help from another person to put on and taking off regular upper body clothing?: A Little Help from another person to put on and taking  off regular lower body clothing?: A Lot 6 Click Score: 15   End of Session Equipment Utilized During Treatment: Rolling walker (2 wheels);Other (comment) (KI) Nurse Communication: Mobility status  Activity Tolerance: Patient tolerated treatment well Patient left: in bed;with call bell/phone within  reach;with bed alarm set  OT Visit Diagnosis: Unsteadiness on feet (R26.81);Other abnormalities of gait and mobility (R26.89);Muscle weakness (generalized) (M62.81);Pain Pain - Right/Left: Left Pain - part of body: Knee                Time: 8413-2440 OT Time Calculation (min): 11 min Charges:  OT General Charges $OT Visit: 1 Visit OT Evaluation $OT Eval Moderate Complexity: 1 Mod  Pollyann Glen E. Wynonia Medero, OTR/L Acute Rehabilitation Services [phone]   Cherlyn Cushing 04/07/2023, 2:44 PM

## 2023-04-07 NOTE — Progress Notes (Signed)
   04/07/23 1200  Mobility  Activity Transferred from chair to bed  Level of Assistance Minimal assist, patient does 75% or more  Assistive Device Front wheel walker  LLE Weight Bearing NWB  Activity Response Tolerated well  Mobility Referral Yes  $Mobility charge 1 Mobility  Mobility Specialist Start Time (ACUTE ONLY) 1200  Mobility Specialist Stop Time (ACUTE ONLY) 1225  Mobility Specialist Time Calculation (min) (ACUTE ONLY) 25 min   Mobility Specialist: Progress Note  Pt agreeable to mobility session - received in chair. Required MinA throughout using RW with no complaints. Returned to bed with all needs met - call bell within reach. RN Present.   Cues required for NWB on LLE.   Barnie Mort, BS Mobility Specialist Please contact via SecureChat or Rehab office at [phone].

## 2023-04-08 ENCOUNTER — Telehealth: Payer: Self-pay

## 2023-04-08 NOTE — Transitions of Care (Post Inpatient/ED Visit) (Signed)
04/08/2023  Name: Talithia Ercole MRN: [id number] DOB: [birth year]  Today's TOC FU Call Status:   Patient's Name and Date of Birth confirmed.  Transition Care Management Follow-up Telephone Call Date of Discharge: 04/07/23 [facility]charge [facility]) Type of Discharge: Inpatient Admission Primary Inpatient Discharge Diagnosis:: Closed fracture of left tibial plateau How have you been since you were released from the hospital?: Better Any questions or concerns?: No  Items Reviewed: Did you receive and understand the discharge instructions provided?: Yes Medications obtained,verified, and reconciled?: Yes (Medications Reviewed) Any new allergies since your discharge?: No Dietary orders reviewed?: NA Do you have support at home?: Yes People in Home: spouse Name of Support/Comfort Primary Source: Wynelle Beckmann  Medications Reviewed Today: Medications Reviewed Today   Medications were not reviewed in this encounter     Home Care and Equipment/Supplies: Were Home Health Services Ordered?: NA Any new equipment or medical supplies ordered?: NA  Functional Questionnaire: Do you need assistance with bathing/showering or dressing?: Yes Do you need assistance with meal preparation?: Yes Do you need assistance with eating?: No Do you have difficulty maintaining continence: No Do you need assistance with getting out of bed/getting out of a chair/moving?: Yes Do you have difficulty managing or taking your medications?: No  Follow up appointments reviewed: PCP Follow-up appointment confirmed?: Yes Date of PCP follow-up appointment?: 04/08/23 Follow-up Provider: [facility] Follow-up appointment confirmed?: NA Do you need transportation to your follow-up appointment?: No Do you understand care options if your condition(s) worsen?: Yes-patient verbalized understanding    SIGNATURE Randa Lynn, CMA

## 2023-04-09 DIAGNOSIS — F339 Major depressive disorder, recurrent, unspecified: Secondary | ICD-10-CM | POA: Diagnosis not present

## 2023-04-09 DIAGNOSIS — I428 Other cardiomyopathies: Secondary | ICD-10-CM | POA: Diagnosis not present

## 2023-04-09 DIAGNOSIS — I110 Hypertensive heart disease with heart failure: Secondary | ICD-10-CM | POA: Diagnosis not present

## 2023-04-09 DIAGNOSIS — M48061 Spinal stenosis, lumbar region without neurogenic claudication: Secondary | ICD-10-CM | POA: Diagnosis not present

## 2023-04-09 DIAGNOSIS — I472 Ventricular tachycardia, unspecified: Secondary | ICD-10-CM | POA: Diagnosis not present

## 2023-04-09 DIAGNOSIS — S82142D Displaced bicondylar fracture of left tibia, subsequent encounter for closed fracture with routine healing: Secondary | ICD-10-CM | POA: Diagnosis not present

## 2023-04-09 DIAGNOSIS — I5022 Chronic systolic (congestive) heart failure: Secondary | ICD-10-CM | POA: Diagnosis not present

## 2023-04-09 DIAGNOSIS — I739 Peripheral vascular disease, unspecified: Secondary | ICD-10-CM | POA: Diagnosis not present

## 2023-04-09 DIAGNOSIS — I2510 Atherosclerotic heart disease of native coronary artery without angina pectoris: Secondary | ICD-10-CM | POA: Diagnosis not present

## 2023-04-10 NOTE — Progress Notes (Signed)
Care Coordination Note  04/10/2023 Name: Debbie Barry MRN: [id number] DOB: [birth year]  Debbie Barry is a 72 y.o. year old female who is a primary care patient of Dorothyann Peng, MD and is actively engaged with the  Care Management team. I reached out to [name] by phone today to assist with re-scheduling a follow up visit with the Pharmacist  Follow up plan: Patient declines further follow up and engagement by the care management team. Appropriate care team members and provider have been notified via electronic communication.   Penne Lash, RMA Care Guide [facility] [address]  [place], [place] [address] Direct Dial: [phone] [email or web address]

## 2023-04-11 ENCOUNTER — Telehealth: Payer: Self-pay

## 2023-04-11 NOTE — Telephone Encounter (Signed)
error 

## 2023-04-12 DIAGNOSIS — S82142D Displaced bicondylar fracture of left tibia, subsequent encounter for closed fracture with routine healing: Secondary | ICD-10-CM | POA: Diagnosis not present

## 2023-04-12 DIAGNOSIS — M48061 Spinal stenosis, lumbar region without neurogenic claudication: Secondary | ICD-10-CM | POA: Diagnosis not present

## 2023-04-12 DIAGNOSIS — I110 Hypertensive heart disease with heart failure: Secondary | ICD-10-CM | POA: Diagnosis not present

## 2023-04-12 DIAGNOSIS — I5022 Chronic systolic (congestive) heart failure: Secondary | ICD-10-CM | POA: Diagnosis not present

## 2023-04-12 DIAGNOSIS — F339 Major depressive disorder, recurrent, unspecified: Secondary | ICD-10-CM | POA: Diagnosis not present

## 2023-04-12 DIAGNOSIS — I2510 Atherosclerotic heart disease of native coronary artery without angina pectoris: Secondary | ICD-10-CM | POA: Diagnosis not present

## 2023-04-12 DIAGNOSIS — I739 Peripheral vascular disease, unspecified: Secondary | ICD-10-CM | POA: Diagnosis not present

## 2023-04-12 DIAGNOSIS — I428 Other cardiomyopathies: Secondary | ICD-10-CM | POA: Diagnosis not present

## 2023-04-12 DIAGNOSIS — I472 Ventricular tachycardia, unspecified: Secondary | ICD-10-CM | POA: Diagnosis not present

## 2023-04-14 ENCOUNTER — Telehealth: Payer: Self-pay | Admitting: Orthopedic Surgery

## 2023-04-14 ENCOUNTER — Encounter: Payer: Self-pay | Admitting: Internal Medicine

## 2023-04-14 ENCOUNTER — Telehealth: Payer: Medicare HMO | Admitting: Internal Medicine

## 2023-04-14 DIAGNOSIS — I472 Ventricular tachycardia, unspecified: Secondary | ICD-10-CM | POA: Diagnosis not present

## 2023-04-14 DIAGNOSIS — M48061 Spinal stenosis, lumbar region without neurogenic claudication: Secondary | ICD-10-CM | POA: Diagnosis not present

## 2023-04-14 DIAGNOSIS — F339 Major depressive disorder, recurrent, unspecified: Secondary | ICD-10-CM | POA: Diagnosis not present

## 2023-04-14 DIAGNOSIS — I5022 Chronic systolic (congestive) heart failure: Secondary | ICD-10-CM

## 2023-04-14 DIAGNOSIS — I255 Ischemic cardiomyopathy: Secondary | ICD-10-CM

## 2023-04-14 DIAGNOSIS — I110 Hypertensive heart disease with heart failure: Secondary | ICD-10-CM | POA: Diagnosis not present

## 2023-04-14 DIAGNOSIS — E2839 Other primary ovarian failure: Secondary | ICD-10-CM | POA: Diagnosis not present

## 2023-04-14 DIAGNOSIS — I739 Peripheral vascular disease, unspecified: Secondary | ICD-10-CM | POA: Diagnosis not present

## 2023-04-14 DIAGNOSIS — Z86718 Personal history of other venous thrombosis and embolism: Secondary | ICD-10-CM

## 2023-04-14 DIAGNOSIS — I2510 Atherosclerotic heart disease of native coronary artery without angina pectoris: Secondary | ICD-10-CM | POA: Diagnosis not present

## 2023-04-14 DIAGNOSIS — I428 Other cardiomyopathies: Secondary | ICD-10-CM | POA: Diagnosis not present

## 2023-04-14 DIAGNOSIS — S82142D Displaced bicondylar fracture of left tibia, subsequent encounter for closed fracture with routine healing: Secondary | ICD-10-CM | POA: Diagnosis not present

## 2023-04-14 MED ORDER — OXYCODONE HCL 5 MG PO TABS
5.0000 mg | ORAL_TABLET | ORAL | 0 refills | Status: AC | PRN
Start: 2023-04-14 — End: 2023-04-19

## 2023-04-14 NOTE — Addendum Note (Signed)
Addended by: Willia Craze on: 04/14/2023 12:18 PM   Modules accepted: Orders

## 2023-04-14 NOTE — Patient Instructions (Signed)
Bone Density Test A bone density test uses a type of X-ray to measure the amount of calcium and other minerals in a person's bones. It can measure bone density in the hip and the spine. The test is similar to having a regular X-ray. This test may also be called: Bone densitometry. Bone mineral density test. Dual-energy X-ray absorptiometry (DEXA). You may have this test to: Diagnose a condition that causes weak or thin bones (osteoporosis). Screen you for osteoporosis. Predict your risk for a broken bone (fracture). Determine how well your osteoporosis treatment is working. Tell a health care provider about: Any allergies you have. All medicines you are taking, including vitamins, herbs, eye drops, creams, and over-the-counter medicines. Any problems you or family members have had with anesthetic medicines. Any blood disorders you have. Any surgeries you have had. Any medical conditions you have. Whether you are pregnant or may be pregnant. Any medical tests you have had within the past 14 days that used contrast material. What are the risks? Generally, this is a safe test. However, it does expose you to a small amount of radiation, which can slightly increase your cancer risk. What happens before the test? Do not take any calcium supplements within the 24 hours before your test. You will need to remove all metal jewelry, eyeglasses, removable dental appliances, and any other metal objects on your body. What happens during the test?  You will lie down on an exam table. There will be an X-ray generator below you and an imaging device above you. Other devices, such as boxes or braces, may be used to position your body properly for the scan. The machine will slowly scan your body. You will need to keep very still while the machine does the scan. The images will show up on a screen in the room. Images will be examined by a specialist after your test is finished. The procedure may vary among  health care providers and hospitals. What can I expect after the test? It is up to you to get the results of your test. Ask your health care provider, or the department that is doing the test, when your results will be ready. Summary A bone density test is an imaging test that uses a type of X-ray to measure the amount of calcium and other minerals in your bones. The test may be used to diagnose or screen you for a condition that causes weak or thin bones (osteoporosis), predict your risk for a broken bone (fracture), or determine how well your osteoporosis treatment is working. Do not take any calcium supplements within 24 hours before your test. Ask your health care provider, or the department that is doing the test, when your results will be ready. This information is not intended to replace advice given to you by your health care provider. Make sure you discuss any questions you have with your health care provider. Document Revised: 02/28/2021 Document Reviewed: 12/02/2019 Elsevier Patient Education  2024 [employer].

## 2023-04-14 NOTE — Telephone Encounter (Signed)
Patient called and ask if she could a refill on her Oxycodone. [phone]

## 2023-04-14 NOTE — Progress Notes (Unsigned)
Virtual Visit via Video   This visit type was conducted due to national recommendations for restrictions regarding the COVID-19 Pandemic (e.g. social distancing) in an effort to limit this patient's exposure and mitigate transmission in our community.  Due to her co-morbid illnesses, this patient is at least at moderate risk for complications without adequate follow up.  This format is felt to be most appropriate for this patient at this time.  All issues noted in this document were discussed and addressed.  A limited physical exam was performed with this format.    This visit type was conducted due to national recommendations for restrictions regarding the COVID-19 Pandemic (e.g. social distancing) in an effort to limit this patient's exposure and mitigate transmission in our community.  Patients identity confirmed using two different identifiers.  This format is felt to be most appropriate for this patient at this time.  All issues noted in this document were discussed and addressed.  No physical exam was performed (except for noted visual exam findings with Video Visits).    Date:  04/21/2023   ID:  Debbie Barry, Debbie Barry Nov 24, 1950, MRN [id number]  Patient Location:  Home  Provider location:   Office    Chief Complaint:  "I have hospital f/u"  History of Present Illness:    Zakiyyah Livergood is a 72 y.o. female who presents via video conferencing for a telehealth visit today.    The patient does not have symptoms concerning for COVID-19 infection (fever, chills, cough, or new shortness of breath).   Patient presents today virtually, per her request, for HPFU. She presented to [facility] ER 04/05/2023 after a fall. She states she was at home when she lost her balance and fell.  She did hit her head and busted her lip.  Husband came and picked her up.  After the fall, she noticed left knee pain which progressively worsened and hence was brought to the ED for further evaluation.      ED course: she was hemodynamically stable.CT  head: no acute finding and empty sella. CT cervical spine: no acute findings. Multilevel severe degenerative changes of the spine with grade 1 anterolisthesis of C2 on C3, C3 on C4, C4 on C5. Associated severe neural foraminal stenosis bilaterally at the C4-C5 level. CT left knee: Subtle nondisplaced minimally impacted fracture of the lateral left tibial plateau with hemarthrosis, as above.  She was subsequently evaluated by Ortho and admitted.   She was discharged on 04/07/2023 in stable condition. Today, she reports feeling alright. She admits being in some pain this morning.  She has no new concerns.       Past Medical History:  Diagnosis Date   Fatty liver    GERD (gastroesophageal reflux disease)    Hypertension    Nausea and vomiting 03/31/2021   Osteomyelitis (HCC)    right third toe   Peripheral vascular disease (HCC)    Ulcer 08/14/20   Past Surgical History:  Procedure Laterality Date   AMPUTATION Right 08/08/2017   Procedure: AMPUTATION RIGHT 3RD TOE;  Surgeon: Nadara Mustard, MD;  Location: [facility] OR;  Service: Orthopedics;  Laterality: Right;   BIOPSY  08/14/2020   Procedure: BIOPSY;  Surgeon: Vida Rigger, MD;  Location: [facility] ENDOSCOPY;  Service: Endoscopy;;   Bunionectomy Right 2006     Right   Bunionectony Left 2006      August   COLONOSCOPY N/A 01/21/2014   Procedure: COLONOSCOPY;  Surgeon: Theda Belfast, MD;  Location:  [facility] ENDOSCOPY;  Service: Endoscopy;  Laterality: N/A;   ESOPHAGOGASTRODUODENOSCOPY (EGD) WITH PROPOFOL N/A 08/14/2020   Procedure: ESOPHAGOGASTRODUODENOSCOPY (EGD) WITH PROPOFOL;  Surgeon: Vida Rigger, MD;  Location: [facility] ENDOSCOPY;  Service: Endoscopy;  Laterality: N/A;   IR THORACENTESIS ASP PLEURAL SPACE W/IMG GUIDE  05/03/2021   RIGHT/LEFT HEART CATH AND CORONARY ANGIOGRAPHY N/A 05/11/2021   Procedure: RIGHT/LEFT HEART CATH AND CORONARY ANGIOGRAPHY;  Surgeon: Lennette Bihari, MD;  Location: [facility] INVASIVE  CV LAB;  Service: Cardiovascular;  Laterality: N/A;   TOTAL HIP ARTHROPLASTY Left 05/11/2022   TUBAL LIGATION     WISDOM TOOTH EXTRACTION       Current Meds  Medication Sig   acetaminophen (TYLENOL) 500 MG tablet Take 2 tablets (1,000 mg total) by mouth 3 (three) times daily.   apixaban (ELIQUIS) 5 MG TABS tablet TAKE 1 TABLET(5 MG) BY MOUTH TWICE DAILY (Patient taking differently: Take 5 mg by mouth 2 (two) times daily.)   Cholecalciferol (VITAMIN D3) 5000 units CAPS Take 5,000 Units by mouth daily.   Magnesium 500 MG TABS Take 1 tablet by mouth daily.   metoprolol succinate (TOPROL XL) 25 MG 24 hr tablet Take 1 tablet (25 mg total) by mouth daily.   Multiple Vitamin (MULTIVITAMIN WITH MINERALS) TABS tablet Take 1 tablet by mouth daily.   OVER THE COUNTER MEDICATION Take 1 capsule by mouth daily. Patient takes a circulation pill from [place], last took on Thursday.   [EXPIRED] oxyCODONE (ROXICODONE) 5 MG immediate release tablet Take 1 tablet (5 mg total) by mouth every 4 (four) hours as needed for up to 5 days for severe pain.   pantoprazole (PROTONIX) 40 MG tablet Take 1 tablet (40 mg total) by mouth daily.   potassium chloride SA (KLOR-CON M) 20 MEQ tablet TAKE 1 TABLET(20 MEQ) BY MOUTH TWICE DAILY (Patient taking differently: Take 20 mEq by mouth 2 (two) times daily.)   sacubitril-valsartan (ENTRESTO) 24-26 MG Take 1 tablet by mouth 2 (two) times daily.   sertraline (ZOLOFT) 50 MG tablet Take 1 tablet (50 mg total) by mouth daily.   spironolactone (ALDACTONE) 25 MG tablet Take 0.5 tablets (12.5 mg total) by mouth daily.   sucralfate (CARAFATE) 1 g tablet TAKE 1 TABLET(1 GRAM) BY MOUTH TWICE DAILY prn   tetrahydrozoline 0.05 % ophthalmic solution Place 1 drop into both eyes daily.   torsemide (DEMADEX) 20 MG tablet Take 1 tablet (20 mg total) by mouth daily as needed (swelling).     Allergies:   Carvedilol   Social History   Tobacco Use   Smoking status: Never   Smokeless tobacco:  Never  Vaping Use   Vaping status: Never Used  Substance Use Topics   Alcohol use: Not Currently    Comment: social glass of wine   Drug use: No     Family Hx: The patient's family history includes Arthritis in her father and mother; Breast cancer in her mother; Cancer in her father and mother; Colon cancer in her mother; Heart Problems in her mother; Heart disease in her father and mother; Hypertension in her father and mother; Multiple myeloma in her father and mother.  ROS:   Please see the history of present illness.    Review of Systems  Constitutional: Negative.   HENT: Negative.    Respiratory: Negative.    Cardiovascular: Negative.   Musculoskeletal:  Positive for falls and joint pain.  Neurological: Negative.   Endo/Heme/Allergies: Negative.   Psychiatric/Behavioral: Negative.      All other  systems reviewed and are negative.   Labs/Other Tests and Data Reviewed:    Recent Labs: 07/25/2022: BNP 118.3; TSH 2.700 12/31/2022: ALT 35 04/06/2023: BUN <5; Creatinine, Ser 0.65; Magnesium 1.6; Potassium 3.5; Sodium 139 04/07/2023: Hemoglobin 12.6; Platelets 131   Recent Lipid Panel Lab Results  Component Value Date/Time   CHOL 138 07/25/2022 12:02 PM   TRIG 130 07/25/2022 12:02 PM   HDL 62 07/25/2022 12:02 PM   CHOLHDL 2.2 07/25/2022 12:02 PM   LDLCALC 54 07/25/2022 12:02 PM    Wt Readings from Last 3 Encounters:  04/05/23 174 lb (78.9 kg)  03/10/23 174 lb 6.4 oz (79.1 kg)  12/31/22 168 lb 3.2 oz (76.3 kg)     Exam:    Vital Signs:  There were no vitals taken for this visit.    Physical Exam Vitals and nursing note reviewed.  Constitutional:      Appearance: Normal appearance. She is obese.  HENT:     Head: Normocephalic and atraumatic.  Eyes:     Extraocular Movements: Extraocular movements intact.  Pulmonary:     Effort: Pulmonary effort is normal.  Musculoskeletal:     Cervical back: Normal range of motion.  Neurological:     Mental Status: She is  alert and oriented to person, place, and time.  Psychiatric:        Mood and Affect: Affect normal.     ASSESSMENT & PLAN:    Closed fracture of left tibial plateau with routine healing, subsequent encounter Assessment & Plan: TCM PERFORMED. A MEMBER OF THE CLINICAL TEAM SPOKE WITH THE PATIENT UPON DISCHARGE. DISCHARGE SUMMARY WAS REVIEWED IN FULL DETAIL DURING THE VISIT. MEDS RECONCILED AND COMPARED TO DISCHARGE MEDS. MEDICATION LIST WAS UPDATED AND REVIEWED WITH THE PATIENT. GREATER THAN 50% FACE TO FACE TIME WAS SPENT IN COUNSELING AND COORDINATION OF CARE. ALL QUESTIONS WERE ANSWERED TO THE SATISFACTION OF THE PATIENT. Pt will continue with physical therapy, pain control with scheduled Tylenol, as needed oxycodone. She is to remain non-weightbearing on left lower extremity and has been advised to use knee immobilizer when out of bed. She is also advised to keep all f/u Ortho appointments.    Orders: -     DG Bone Density; Future  Hypertensive heart disease with chronic systolic congestive heart failure (HCC) Assessment & Plan: Chronic, she is now stable on Entresto, spironolactone, Toprol XL and demadex. She is reminded to follow a low sodium diet and to keep f/u Cardiology appointments.    Estrogen deficiency -     DG Bone Density; Future  Major depression, recurrent, chronic (HCC) Assessment & Plan: Chronic, sx are stable on sertraline.    History of DVT in adulthood     COVID-19 Education: The signs and symptoms of COVID-19 were discussed with the patient and how to seek care for testing (follow up with PCP or arrange E-visit).  The importance of social distancing was discussed today.  Patient Risk:   After full review of this patients clinical status, I feel that they are at least moderate risk at this time.  Time:   Today, I have spent 16 minutes/ seconds with the patient with telehealth technology discussing above diagnoses.     Medication Adjustments/Labs and  Tests Ordered: Current medicines are reviewed at length with the patient today.  Concerns regarding medicines are outlined above.   Tests Ordered: Orders Placed This Encounter  Procedures   DG Bone Density    Medication Changes: No orders of the defined  types were placed in this encounter.   Disposition:  Follow up prn  Signed, Gwynneth Aliment, MD

## 2023-04-15 DIAGNOSIS — I472 Ventricular tachycardia, unspecified: Secondary | ICD-10-CM | POA: Diagnosis not present

## 2023-04-15 DIAGNOSIS — M48061 Spinal stenosis, lumbar region without neurogenic claudication: Secondary | ICD-10-CM | POA: Diagnosis not present

## 2023-04-15 DIAGNOSIS — I428 Other cardiomyopathies: Secondary | ICD-10-CM | POA: Diagnosis not present

## 2023-04-15 DIAGNOSIS — F339 Major depressive disorder, recurrent, unspecified: Secondary | ICD-10-CM | POA: Diagnosis not present

## 2023-04-15 DIAGNOSIS — I5022 Chronic systolic (congestive) heart failure: Secondary | ICD-10-CM | POA: Diagnosis not present

## 2023-04-15 DIAGNOSIS — I2510 Atherosclerotic heart disease of native coronary artery without angina pectoris: Secondary | ICD-10-CM | POA: Diagnosis not present

## 2023-04-15 DIAGNOSIS — I110 Hypertensive heart disease with heart failure: Secondary | ICD-10-CM | POA: Diagnosis not present

## 2023-04-15 DIAGNOSIS — I739 Peripheral vascular disease, unspecified: Secondary | ICD-10-CM | POA: Diagnosis not present

## 2023-04-15 DIAGNOSIS — S82142D Displaced bicondylar fracture of left tibia, subsequent encounter for closed fracture with routine healing: Secondary | ICD-10-CM | POA: Diagnosis not present

## 2023-04-17 DIAGNOSIS — F339 Major depressive disorder, recurrent, unspecified: Secondary | ICD-10-CM | POA: Diagnosis not present

## 2023-04-17 DIAGNOSIS — I472 Ventricular tachycardia, unspecified: Secondary | ICD-10-CM | POA: Diagnosis not present

## 2023-04-17 DIAGNOSIS — I428 Other cardiomyopathies: Secondary | ICD-10-CM | POA: Diagnosis not present

## 2023-04-17 DIAGNOSIS — S82142D Displaced bicondylar fracture of left tibia, subsequent encounter for closed fracture with routine healing: Secondary | ICD-10-CM | POA: Diagnosis not present

## 2023-04-17 DIAGNOSIS — M48061 Spinal stenosis, lumbar region without neurogenic claudication: Secondary | ICD-10-CM | POA: Diagnosis not present

## 2023-04-17 DIAGNOSIS — I5022 Chronic systolic (congestive) heart failure: Secondary | ICD-10-CM | POA: Diagnosis not present

## 2023-04-17 DIAGNOSIS — I739 Peripheral vascular disease, unspecified: Secondary | ICD-10-CM | POA: Diagnosis not present

## 2023-04-17 DIAGNOSIS — I110 Hypertensive heart disease with heart failure: Secondary | ICD-10-CM | POA: Diagnosis not present

## 2023-04-17 DIAGNOSIS — I2510 Atherosclerotic heart disease of native coronary artery without angina pectoris: Secondary | ICD-10-CM | POA: Diagnosis not present

## 2023-04-20 DIAGNOSIS — E2839 Other primary ovarian failure: Secondary | ICD-10-CM | POA: Insufficient documentation

## 2023-04-20 NOTE — Assessment & Plan Note (Signed)
TCM PERFORMED. A MEMBER OF THE CLINICAL TEAM SPOKE WITH THE PATIENT UPON DISCHARGE. DISCHARGE SUMMARY WAS REVIEWED IN FULL DETAIL DURING THE VISIT. MEDS RECONCILED AND COMPARED TO DISCHARGE MEDS. MEDICATION LIST WAS UPDATED AND REVIEWED WITH THE PATIENT. GREATER THAN 50% FACE TO FACE TIME WAS SPENT IN COUNSELING AND COORDINATION OF CARE. ALL QUESTIONS WERE ANSWERED TO THE SATISFACTION OF THE PATIENT. Pt will continue with physical therapy, pain control with scheduled Tylenol, as needed oxycodone. She is to remain non-weightbearing on left lower extremity and has been advised to use knee immobilizer when out of bed. She is also advised to keep all f/u Ortho appointments.

## 2023-04-21 DIAGNOSIS — I428 Other cardiomyopathies: Secondary | ICD-10-CM | POA: Diagnosis not present

## 2023-04-21 DIAGNOSIS — F339 Major depressive disorder, recurrent, unspecified: Secondary | ICD-10-CM | POA: Diagnosis not present

## 2023-04-21 DIAGNOSIS — I110 Hypertensive heart disease with heart failure: Secondary | ICD-10-CM | POA: Diagnosis not present

## 2023-04-21 DIAGNOSIS — I472 Ventricular tachycardia, unspecified: Secondary | ICD-10-CM | POA: Diagnosis not present

## 2023-04-21 DIAGNOSIS — I739 Peripheral vascular disease, unspecified: Secondary | ICD-10-CM | POA: Diagnosis not present

## 2023-04-21 DIAGNOSIS — S82142D Displaced bicondylar fracture of left tibia, subsequent encounter for closed fracture with routine healing: Secondary | ICD-10-CM | POA: Diagnosis not present

## 2023-04-21 DIAGNOSIS — I2510 Atherosclerotic heart disease of native coronary artery without angina pectoris: Secondary | ICD-10-CM | POA: Diagnosis not present

## 2023-04-21 DIAGNOSIS — M48061 Spinal stenosis, lumbar region without neurogenic claudication: Secondary | ICD-10-CM | POA: Diagnosis not present

## 2023-04-21 DIAGNOSIS — I5022 Chronic systolic (congestive) heart failure: Secondary | ICD-10-CM | POA: Diagnosis not present

## 2023-04-21 NOTE — Assessment & Plan Note (Signed)
Chronic, she is now stable on Entresto, spironolactone, Toprol XL and demadex. She is reminded to follow a low sodium diet and to keep f/u Cardiology appointments.

## 2023-04-21 NOTE — Assessment & Plan Note (Signed)
Chronic, sx are stable on sertraline.

## 2023-04-22 DIAGNOSIS — M48061 Spinal stenosis, lumbar region without neurogenic claudication: Secondary | ICD-10-CM | POA: Diagnosis not present

## 2023-04-22 DIAGNOSIS — I2510 Atherosclerotic heart disease of native coronary artery without angina pectoris: Secondary | ICD-10-CM | POA: Diagnosis not present

## 2023-04-22 DIAGNOSIS — I428 Other cardiomyopathies: Secondary | ICD-10-CM | POA: Diagnosis not present

## 2023-04-22 DIAGNOSIS — I472 Ventricular tachycardia, unspecified: Secondary | ICD-10-CM | POA: Diagnosis not present

## 2023-04-22 DIAGNOSIS — I739 Peripheral vascular disease, unspecified: Secondary | ICD-10-CM | POA: Diagnosis not present

## 2023-04-22 DIAGNOSIS — I110 Hypertensive heart disease with heart failure: Secondary | ICD-10-CM | POA: Diagnosis not present

## 2023-04-22 DIAGNOSIS — I5022 Chronic systolic (congestive) heart failure: Secondary | ICD-10-CM | POA: Diagnosis not present

## 2023-04-22 DIAGNOSIS — S82142D Displaced bicondylar fracture of left tibia, subsequent encounter for closed fracture with routine healing: Secondary | ICD-10-CM | POA: Diagnosis not present

## 2023-04-22 DIAGNOSIS — F339 Major depressive disorder, recurrent, unspecified: Secondary | ICD-10-CM | POA: Diagnosis not present

## 2023-04-24 ENCOUNTER — Telehealth: Payer: Self-pay | Admitting: Orthopedic Surgery

## 2023-04-24 MED ORDER — OXYCODONE HCL 5 MG PO TABS
5.0000 mg | ORAL_TABLET | ORAL | 0 refills | Status: AC | PRN
Start: 2023-04-24 — End: 2023-04-29

## 2023-04-24 NOTE — Telephone Encounter (Signed)
Pt called requesting a refill of oxycodone. Please send to pharmacy on file. Pt phone number is [phone].

## 2023-04-28 ENCOUNTER — Telehealth: Payer: Self-pay | Admitting: Orthopedic Surgery

## 2023-04-28 DIAGNOSIS — I472 Ventricular tachycardia, unspecified: Secondary | ICD-10-CM | POA: Diagnosis not present

## 2023-04-28 DIAGNOSIS — I739 Peripheral vascular disease, unspecified: Secondary | ICD-10-CM | POA: Diagnosis not present

## 2023-04-28 DIAGNOSIS — M48061 Spinal stenosis, lumbar region without neurogenic claudication: Secondary | ICD-10-CM | POA: Diagnosis not present

## 2023-04-28 DIAGNOSIS — S82142D Displaced bicondylar fracture of left tibia, subsequent encounter for closed fracture with routine healing: Secondary | ICD-10-CM | POA: Diagnosis not present

## 2023-04-28 DIAGNOSIS — F339 Major depressive disorder, recurrent, unspecified: Secondary | ICD-10-CM | POA: Diagnosis not present

## 2023-04-28 DIAGNOSIS — I428 Other cardiomyopathies: Secondary | ICD-10-CM | POA: Diagnosis not present

## 2023-04-28 DIAGNOSIS — I2510 Atherosclerotic heart disease of native coronary artery without angina pectoris: Secondary | ICD-10-CM | POA: Diagnosis not present

## 2023-04-28 DIAGNOSIS — I110 Hypertensive heart disease with heart failure: Secondary | ICD-10-CM | POA: Diagnosis not present

## 2023-04-28 DIAGNOSIS — I5022 Chronic systolic (congestive) heart failure: Secondary | ICD-10-CM | POA: Diagnosis not present

## 2023-04-28 NOTE — Telephone Encounter (Signed)
[employer] forms received. To [employer].

## 2023-04-30 ENCOUNTER — Telehealth: Payer: Self-pay | Admitting: Orthopedic Surgery

## 2023-04-30 DIAGNOSIS — S82142D Displaced bicondylar fracture of left tibia, subsequent encounter for closed fracture with routine healing: Secondary | ICD-10-CM | POA: Diagnosis not present

## 2023-04-30 DIAGNOSIS — M48061 Spinal stenosis, lumbar region without neurogenic claudication: Secondary | ICD-10-CM | POA: Diagnosis not present

## 2023-04-30 DIAGNOSIS — I739 Peripheral vascular disease, unspecified: Secondary | ICD-10-CM | POA: Diagnosis not present

## 2023-04-30 DIAGNOSIS — I428 Other cardiomyopathies: Secondary | ICD-10-CM | POA: Diagnosis not present

## 2023-04-30 DIAGNOSIS — F339 Major depressive disorder, recurrent, unspecified: Secondary | ICD-10-CM | POA: Diagnosis not present

## 2023-04-30 DIAGNOSIS — I5022 Chronic systolic (congestive) heart failure: Secondary | ICD-10-CM | POA: Diagnosis not present

## 2023-04-30 DIAGNOSIS — I110 Hypertensive heart disease with heart failure: Secondary | ICD-10-CM | POA: Diagnosis not present

## 2023-04-30 DIAGNOSIS — I2510 Atherosclerotic heart disease of native coronary artery without angina pectoris: Secondary | ICD-10-CM | POA: Diagnosis not present

## 2023-04-30 DIAGNOSIS — I472 Ventricular tachycardia, unspecified: Secondary | ICD-10-CM | POA: Diagnosis not present

## 2023-04-30 NOTE — Telephone Encounter (Signed)
Dr. Christell Constant saw patient in ED 04/06/23 tibial plateau fracture. Has f/u appt 10/31. Forms received. Please advise how long patient will be oow. Thank you!

## 2023-05-01 ENCOUNTER — Ambulatory Visit (INDEPENDENT_AMBULATORY_CARE_PROVIDER_SITE_OTHER): Payer: Medicare HMO | Admitting: Orthopedic Surgery

## 2023-05-01 ENCOUNTER — Encounter: Payer: Self-pay | Admitting: Radiology

## 2023-05-01 ENCOUNTER — Other Ambulatory Visit (INDEPENDENT_AMBULATORY_CARE_PROVIDER_SITE_OTHER): Payer: Medicare HMO

## 2023-05-01 DIAGNOSIS — M25562 Pain in left knee: Secondary | ICD-10-CM

## 2023-05-01 NOTE — Telephone Encounter (Signed)
Noted for [employer]. 

## 2023-05-01 NOTE — Progress Notes (Signed)
Orthopedic Surgery Progress Note   Assessment: Patient is a 72 y.o. female with left tibial plateau fracture Date of injury: 04/06/2023 (~4 weeks from injury)  Plan: -No operative plans at this time -DVT ppx: ASA 81mg  BID -Weight bearing status: NWB LLE -No brace needed, encouraged her to work on range of motion at the knee -PT should continue to treat -Pain control -Patient should return to the office in 4 weeks, x-rays at next visit: AP/lateral left knee  ___________________________________________________________________________  Subjective: Patient sustained a left tibial plateau fracture and was seen in the hospital on 04/06/2023.  Patient's knee pain has gotten significantly better with time.  She says that she is not having much knee pain today.  She has been working with physical therapy at her home.  She has been walking with a walker.  She admits that she has been putting some weight on it to help her mobilize around the house.  Denies paresthesias and numbness.   Physical Exam:  General: no acute distress, appears stated age Neurologic: alert, answering questions appropriately, following commands Respiratory: unlabored breathing on room air, symmetric chest rise Psychiatric: appropriate affect, normal cadence to speech  MSK:   -Left lower extremity  No tenderness to palpation over extremity  Knee ROM from 0-90 EHL/TA/GSC intact Plantarflexes and dorsiflexes toes Sensation intact to light touch in sural, saphenous, tibial, deep peroneal, and superficial peroneal nerve distributions Foot warm and well perfused  Imaging: XRs of the left knee from 05/01/2023 were independently reviewed and interpreted, showing nondisplaced lateral tibial plateau fracture.  No new fracture seen.  No significant degenerative changes seen within the knee.   Patient name: Debbie Barry Patient MRN: [id number] Date: 05/01/23

## 2023-05-05 ENCOUNTER — Ambulatory Visit: Payer: Medicare HMO | Admitting: Internal Medicine

## 2023-05-05 DIAGNOSIS — S82142D Displaced bicondylar fracture of left tibia, subsequent encounter for closed fracture with routine healing: Secondary | ICD-10-CM | POA: Diagnosis not present

## 2023-05-05 DIAGNOSIS — I739 Peripheral vascular disease, unspecified: Secondary | ICD-10-CM | POA: Diagnosis not present

## 2023-05-05 DIAGNOSIS — F339 Major depressive disorder, recurrent, unspecified: Secondary | ICD-10-CM | POA: Diagnosis not present

## 2023-05-05 DIAGNOSIS — I2510 Atherosclerotic heart disease of native coronary artery without angina pectoris: Secondary | ICD-10-CM | POA: Diagnosis not present

## 2023-05-05 DIAGNOSIS — I5022 Chronic systolic (congestive) heart failure: Secondary | ICD-10-CM | POA: Diagnosis not present

## 2023-05-05 DIAGNOSIS — I472 Ventricular tachycardia, unspecified: Secondary | ICD-10-CM | POA: Diagnosis not present

## 2023-05-05 DIAGNOSIS — I428 Other cardiomyopathies: Secondary | ICD-10-CM | POA: Diagnosis not present

## 2023-05-05 DIAGNOSIS — I110 Hypertensive heart disease with heart failure: Secondary | ICD-10-CM | POA: Diagnosis not present

## 2023-05-05 DIAGNOSIS — M48061 Spinal stenosis, lumbar region without neurogenic claudication: Secondary | ICD-10-CM | POA: Diagnosis not present

## 2023-05-06 DIAGNOSIS — I472 Ventricular tachycardia, unspecified: Secondary | ICD-10-CM | POA: Diagnosis not present

## 2023-05-06 DIAGNOSIS — S82142D Displaced bicondylar fracture of left tibia, subsequent encounter for closed fracture with routine healing: Secondary | ICD-10-CM | POA: Diagnosis not present

## 2023-05-06 DIAGNOSIS — M48061 Spinal stenosis, lumbar region without neurogenic claudication: Secondary | ICD-10-CM | POA: Diagnosis not present

## 2023-05-06 DIAGNOSIS — I2510 Atherosclerotic heart disease of native coronary artery without angina pectoris: Secondary | ICD-10-CM | POA: Diagnosis not present

## 2023-05-06 DIAGNOSIS — F339 Major depressive disorder, recurrent, unspecified: Secondary | ICD-10-CM | POA: Diagnosis not present

## 2023-05-06 DIAGNOSIS — I5022 Chronic systolic (congestive) heart failure: Secondary | ICD-10-CM | POA: Diagnosis not present

## 2023-05-06 DIAGNOSIS — I428 Other cardiomyopathies: Secondary | ICD-10-CM | POA: Diagnosis not present

## 2023-05-06 DIAGNOSIS — I110 Hypertensive heart disease with heart failure: Secondary | ICD-10-CM | POA: Diagnosis not present

## 2023-05-06 DIAGNOSIS — I739 Peripheral vascular disease, unspecified: Secondary | ICD-10-CM | POA: Diagnosis not present

## 2023-05-14 ENCOUNTER — Ambulatory Visit: Payer: Medicare HMO | Admitting: Internal Medicine

## 2023-05-28 DIAGNOSIS — E2839 Other primary ovarian failure: Secondary | ICD-10-CM | POA: Diagnosis not present

## 2023-05-28 DIAGNOSIS — M8588 Other specified disorders of bone density and structure, other site: Secondary | ICD-10-CM | POA: Diagnosis not present

## 2023-05-28 LAB — HM DEXA SCAN

## 2023-06-02 ENCOUNTER — Ambulatory Visit: Payer: Medicare HMO | Admitting: Orthopedic Surgery

## 2023-06-19 ENCOUNTER — Other Ambulatory Visit: Payer: Self-pay

## 2023-06-19 ENCOUNTER — Other Ambulatory Visit (INDEPENDENT_AMBULATORY_CARE_PROVIDER_SITE_OTHER): Payer: Medicare HMO | Admitting: Internal Medicine

## 2023-06-19 ENCOUNTER — Ambulatory Visit (INDEPENDENT_AMBULATORY_CARE_PROVIDER_SITE_OTHER): Payer: Medicare HMO

## 2023-06-19 DIAGNOSIS — F5101 Primary insomnia: Secondary | ICD-10-CM

## 2023-06-19 DIAGNOSIS — K760 Fatty (change of) liver, not elsewhere classified: Secondary | ICD-10-CM

## 2023-06-19 DIAGNOSIS — I5022 Chronic systolic (congestive) heart failure: Secondary | ICD-10-CM

## 2023-06-19 DIAGNOSIS — I110 Hypertensive heart disease with heart failure: Secondary | ICD-10-CM

## 2023-06-19 DIAGNOSIS — M48062 Spinal stenosis, lumbar region with neurogenic claudication: Secondary | ICD-10-CM | POA: Diagnosis not present

## 2023-06-19 DIAGNOSIS — I428 Other cardiomyopathies: Secondary | ICD-10-CM | POA: Diagnosis not present

## 2023-06-19 DIAGNOSIS — Z0000 Encounter for general adult medical examination without abnormal findings: Secondary | ICD-10-CM | POA: Diagnosis not present

## 2023-06-19 DIAGNOSIS — I2510 Atherosclerotic heart disease of native coronary artery without angina pectoris: Secondary | ICD-10-CM | POA: Diagnosis not present

## 2023-06-19 DIAGNOSIS — F331 Major depressive disorder, recurrent, moderate: Secondary | ICD-10-CM

## 2023-06-19 DIAGNOSIS — Z6832 Body mass index (BMI) 32.0-32.9, adult: Secondary | ICD-10-CM

## 2023-06-19 MED ORDER — PANTOPRAZOLE SODIUM 40 MG PO TBEC: 40.0000 mg | DELAYED_RELEASE_TABLET | Freq: Every day | ORAL | 2 refills | Status: DC

## 2023-06-19 NOTE — Progress Notes (Signed)
Subjective:   Debbie Barry is a 72 y.o. female who presents for Medicare Annual (Subsequent) preventive examination.  Visit Complete: Virtual I connected with  Debbie Barry on 06/19/23 by a audio enabled telemedicine application and verified that I am speaking with the correct person using two identifiers.  Patient Location: Home  Provider Location: Office/Clinic  I discussed the limitations of evaluation and management by telemedicine. The patient expressed understanding and agreed to proceed.  Vital Signs: Because this visit was a virtual/telehealth visit, some criteria may be missing or patient reported. Any vitals not documented were not able to be obtained and vitals that have been documented are patient reported.    Cardiac Risk Factors include: advanced age (>71men, >18 women);hypertension     Objective:    Today's Vitals   There is no height or weight on file to calculate BMI.     06/19/2023    2:40 PM 04/07/2023   12:24 PM 04/06/2023    2:22 PM 04/05/2023    8:22 PM 06/13/2022   11:39 AM 05/30/2021   10:52 AM 05/10/2021    6:00 PM  Advanced Directives  Does Patient Have a Medical Advance Directive? No No No No No No   Would patient like information on creating a medical advance directive?  No - Patient declined Yes (Inpatient - patient requests chaplain consult to create a medical advance directive) No - Patient declined   No - Patient declined    Current Medications (verified) Outpatient Encounter Medications as of 06/19/2023  Medication Sig   acetaminophen (TYLENOL) 500 MG tablet Take 2 tablets (1,000 mg total) by mouth 3 (three) times daily.   apixaban (ELIQUIS) 5 MG TABS tablet TAKE 1 TABLET(5 MG) BY MOUTH TWICE DAILY (Patient taking differently: Take 5 mg by mouth 2 (two) times daily.)   Cholecalciferol (VITAMIN D3) 5000 units CAPS Take 5,000 Units by mouth daily.   Magnesium 500 MG TABS Take 1 tablet by mouth daily.   metoprolol succinate  (TOPROL XL) 25 MG 24 hr tablet Take 1 tablet (25 mg total) by mouth daily.   Multiple Vitamin (MULTIVITAMIN WITH MINERALS) TABS tablet Take 1 tablet by mouth daily.   OVER THE COUNTER MEDICATION Take 1 capsule by mouth daily. Patient takes a circulation pill from [place], last took on Thursday.   pantoprazole (PROTONIX) 40 MG tablet Take 1 tablet (40 mg total) by mouth daily.   potassium chloride SA (KLOR-CON M) 20 MEQ tablet TAKE 1 TABLET(20 MEQ) BY MOUTH TWICE DAILY (Patient taking differently: Take 20 mEq by mouth 2 (two) times daily.)   sacubitril-valsartan (ENTRESTO) 24-26 MG Take 1 tablet by mouth 2 (two) times daily.   sertraline (ZOLOFT) 50 MG tablet Take 1 tablet (50 mg total) by mouth daily.   spironolactone (ALDACTONE) 25 MG tablet Take 0.5 tablets (12.5 mg total) by mouth daily.   sucralfate (CARAFATE) 1 g tablet TAKE 1 TABLET(1 GRAM) BY MOUTH TWICE DAILY prn   tetrahydrozoline 0.05 % ophthalmic solution Place 1 drop into both eyes daily.   torsemide (DEMADEX) 20 MG tablet Take 1 tablet (20 mg total) by mouth daily as needed (swelling).   No facility-administered encounter medications on file as of 06/19/2023.    Allergies (verified) Carvedilol   History: Past Medical History:  Diagnosis Date   Fatty liver    GERD (gastroesophageal reflux disease)    Hypertension    Nausea and vomiting 03/31/2021   Osteomyelitis (HCC)    right third toe  Peripheral vascular disease (HCC)    Ulcer 08/14/20   Past Surgical History:  Procedure Laterality Date   AMPUTATION Right 08/08/2017   Procedure: AMPUTATION RIGHT 3RD TOE;  Surgeon: Nadara Mustard, MD;  Location: [facility] OR;  Service: Orthopedics;  Laterality: Right;   BIOPSY  08/14/2020   Procedure: BIOPSY;  Surgeon: Vida Rigger, MD;  Location: [facility] ENDOSCOPY;  Service: Endoscopy;;   Bunionectomy Right 2006     Right   Bunionectony Left 2006      August   COLONOSCOPY N/A 01/21/2014   Procedure: COLONOSCOPY;  Surgeon: Theda Belfast,  MD;  Location: [facility] ENDOSCOPY;  Service: Endoscopy;  Laterality: N/A;   ESOPHAGOGASTRODUODENOSCOPY (EGD) WITH PROPOFOL N/A 08/14/2020   Procedure: ESOPHAGOGASTRODUODENOSCOPY (EGD) WITH PROPOFOL;  Surgeon: Vida Rigger, MD;  Location: [facility] ENDOSCOPY;  Service: Endoscopy;  Laterality: N/A;   IR THORACENTESIS ASP PLEURAL SPACE W/IMG GUIDE  05/03/2021   RIGHT/LEFT HEART CATH AND CORONARY ANGIOGRAPHY N/A 05/11/2021   Procedure: RIGHT/LEFT HEART CATH AND CORONARY ANGIOGRAPHY;  Surgeon: Lennette Bihari, MD;  Location: [facility] INVASIVE [facility] LAB;  Service: Cardiovascular;  Laterality: N/A;   TOTAL HIP ARTHROPLASTY Left 05/11/2022   TUBAL LIGATION     WISDOM TOOTH EXTRACTION     Family History  Problem Relation Age of Onset   Breast cancer Mother    Colon cancer Mother    Heart Problems Mother    Hypertension Mother    Multiple myeloma Mother    Arthritis Mother    Cancer Mother    Heart disease Mother    Hypertension Father    Heart disease Father    Multiple myeloma Father    Arthritis Father    Cancer Father    Social History   Socioeconomic History   Marital status: Married    Spouse name: Not on file   Number of children: Not on file   Years of education: Not on file   Highest education level: Not on file  Occupational History   Not on file  Tobacco Use   Smoking status: Never   Smokeless tobacco: Never  Vaping Use   Vaping status: Never Used  Substance and Sexual Activity   Alcohol use: Not Currently    Comment: social glass of wine   Drug use: No   Sexual activity: Yes    Birth control/protection: Post-menopausal  Other Topics Concern   Not on file  Social History Narrative   Not on file   Social Drivers of Health   Financial Resource Strain: Low Risk  (06/19/2023)   Overall Financial Resource Strain (CARDIA)    Difficulty of Paying Living Expenses: Not hard at all  Food Insecurity: No Food Insecurity (06/19/2023)   Hunger Vital Sign    Worried About Running Out of Food in  the Last Year: Never true    Ran Out of Food in the Last Year: Never true  Transportation Needs: No Transportation Needs (06/19/2023)   PRAPARE - Administrator, Civil Service (Medical): No    Lack of Transportation (Non-Medical): No  Physical Activity: Inactive (06/19/2023)   Exercise Vital Sign    Days of Exercise per Week: 0 days    Minutes of Exercise per Session: 0 min  Stress: No Stress Concern Present (06/19/2023)   [employer] of Occupational Health - Occupational Stress Questionnaire    Feeling of Stress : Not at all  Social Connections: Moderately Isolated (06/19/2023)   Social Connection and Isolation Panel [NHANES]  Frequency of Communication with Friends and Family: More than three times a week    Frequency of Social Gatherings with Friends and Family: Once a week    Attends Religious Services: Never    Database administrator or Organizations: No    Attends Engineer, structural: Never    Marital Status: Married    Tobacco Counseling Counseling given: Not Answered   Clinical Intake:  Pre-visit preparation completed: Yes  Pain : No/denies pain     Nutritional Risks: Nausea/ vomitting/ diarrhea (diarrhea with eating) Diabetes: No  How often do you need to have someone help you when you read instructions, pamphlets, or other written materials from your doctor or pharmacy?: 1 - Never  Interpreter Needed?: No  Information entered by :: NAllen LPN   Activities of Daily Living    06/19/2023    2:34 PM 04/06/2023    2:22 PM  In your present state of health, do you have any difficulty performing the following activities:  Hearing? 0 0  Vision? 0 0  Difficulty concentrating or making decisions? 0 0  Walking or climbing stairs? 1   Dressing or bathing? 0   Doing errands, shopping? 0 0  Preparing Food and eating ? N   Using the Toilet? N   In the past six months, have you accidently leaked urine? Y   Comment wears a pad   Do you  have problems with loss of bowel control? Y   Comment due to diarrhea   Managing your Medications? N   Managing your Finances? N   Housekeeping or managing your Housekeeping? N     Patient Care Team: Dorothyann Peng, MD as PCP - General (Internal Medicine) O'Neal, Ronnald Ramp, MD as PCP - Cardiology (Cardiology) Magrinat, Valentino Hue, MD (Inactive) as Consulting Physician (Oncology) Vida Rigger, MD as Consulting Physician (Gastroenterology) Clarene Duke, Karma Lew, RN as Case Manager  Indicate any recent Medical Services you may have received from other than [facility] providers in the past year (date may be approximate).     Assessment:   This is a routine wellness examination for [place].  Hearing/Vision screen Hearing Screening - Comments:: Denies hearing issues Vision Screening - Comments:: No regular eye exams, [facility]   Goals Addressed             This Visit's Progress    Patient Stated       06/19/2023, continue working       Depression Screen    06/19/2023    2:42 PM 04/14/2023    1:04 PM 12/31/2022   11:19 AM 07/25/2022   11:11 AM 06/13/2022   11:41 AM 05/30/2021   10:53 AM 04/19/2021    3:44 PM  PHQ 2/9 Scores  PHQ - 2 Score 0 0 3 2 0 0 3  PHQ- 9 Score  0 12 9   6     Fall Risk    06/19/2023    2:40 PM 12/31/2022   11:19 AM 07/25/2022   11:11 AM 06/13/2022   11:40 AM 05/30/2021   10:52 AM  Fall Risk   Falls in the past year? 1 1 1 1 1   Comment left leg gave out   missed the curb legs give out  Number falls in past yr: 0 0 0 0 1  Injury with Fall? 1 1 1 1  0  Comment broke knee   broke hip   Risk for fall due to : Medication side effect;Impaired mobility History of fall(s) History of  fall(s) Impaired mobility;Medication side effect Impaired balance/gait;Impaired mobility;Medication side effect;History of fall(s)  Follow up Falls prevention discussed;Falls evaluation completed Falls evaluation completed Falls evaluation completed Falls evaluation completed;Education  provided;Falls prevention discussed Falls evaluation completed;Education provided;Falls prevention discussed    MEDICARE RISK AT HOME: Medicare Risk at Home Any stairs in or around the home?: No If so, are there any without handrails?: No Home free of loose throw rugs in walkways, pet beds, electrical cords, etc?: Yes Adequate lighting in your home to reduce risk of falls?: Yes Life alert?: No Use of a cane, walker or w/c?: No Grab bars in the bathroom?: Yes Shower chair or bench in shower?: Yes Elevated toilet seat or a handicapped toilet?: Yes  TIMED UP AND GO:  Was the test performed?  No    Cognitive Function:        06/19/2023    2:42 PM 06/13/2022   11:41 AM 05/30/2021   10:55 AM 03/30/2020   10:18 AM 03/23/2019    2:35 PM  6CIT Screen  What Year? 0 points 0 points 0 points 0 points 0 points  What month? 0 points 0 points 0 points 0 points 0 points  What time? 0 points 0 points 0 points 0 points 0 points  Count back from 20 0 points 0 points 0 points 0 points 0 points  Months in reverse 0 points 0 points 0 points 0 points 0 points  Repeat phrase 0 points 4 points 8 points 2 points 0 points  Total Score 0 points 4 points 8 points 2 points 0 points    Immunizations Immunization History  Administered Date(s) Administered   Fluad Trivalent(High Dose 65+) 04/07/2023   Influenza Split 07/11/2014   Influenza, High Dose Seasonal PF 03/23/2019   Influenza, Quadrivalent, Recombinant, Inj, Pf 03/30/2018   Influenza,inj,Quad PF,6+ Mos 03/23/2017   Influenza-Unspecified 03/30/2018, 04/29/2022   PFIZER(Purple Top)SARS-COV-2 Vaccination 09/16/2019, 10/07/2019   PNEUMOCOCCAL CONJUGATE-20 12/31/2022   Pneumococcal Polysaccharide-23 03/23/2019   Tdap 10/16/2015   Zoster Recombinant(Shingrix) 05/28/2019, 08/03/2019    TDAP status: Up to date  Flu Vaccine status: Up to date  Pneumococcal vaccine status: Up to date  Covid-19 vaccine status: Information provided on how to  obtain vaccines.   Qualifies for Shingles Vaccine? Yes   Zostavax completed Yes   Shingrix Completed?: Yes  Screening Tests Health Maintenance  Topic Date Due   COVID-19 Vaccine (3 - Pfizer risk series) 07/05/2023 (Originally 11/04/2019)   Colonoscopy  01/22/2024   Medicare Annual Wellness (AWV)  06/18/2024   MAMMOGRAM  02/24/2025   DTaP/Tdap/Td (2 - Td or Tdap) 10/15/2025   Pneumonia Vaccine 69+ Years old  Completed   INFLUENZA VACCINE  Completed   DEXA SCAN  Completed   Hepatitis C Screening  Completed   Zoster Vaccines- Shingrix  Completed   HPV VACCINES  Aged Out    Health Maintenance  There are no preventive care reminders to display for this patient.   Colorectal cancer screening: Type of screening: Colonoscopy. Completed 01/21/2014. Repeat every 10 years  Mammogram status: Completed 02/25/2023. Repeat every year  Bone Density status: Completed 02/07/2020.   Lung Cancer Screening: (Low Dose CT Chest recommended if Age 64-80 years, 20 pack-year currently smoking OR have quit w/in 15years.) does not qualify.   Lung Cancer Screening Referral: no  Additional Screening:  Hepatitis C Screening: does qualify; Completed 12/30/2012  Vision Screening: Recommended annual ophthalmology exams for early detection of glaucoma and other disorders of the eye. Is the patient up  to date with their annual eye exam?  Yes  Who is the provider or what is the name of the office in which the patient attends annual eye exams? [facility] If pt is not established with a provider, would they like to be referred to a provider to establish care? No .   Dental Screening: Recommended annual dental exams for proper oral hygiene  Diabetic Foot Exam: n/a  Community Resource Referral / Chronic Care Management: CRR required this visit?  No   CCM required this visit?  No     Plan:     I have personally reviewed and noted the following in the patient's chart:   Medical and social history Use of  alcohol, tobacco or illicit drugs  Current medications and supplements including opioid prescriptions. Patient is not currently taking opioid prescriptions. Functional ability and status Nutritional status Physical activity Advanced directives List of other physicians Hospitalizations, surgeries, and ER visits in previous 12 months Vitals Screenings to include cognitive, depression, and falls Referrals and appointments  In addition, I have reviewed and discussed with patient certain preventive protocols, quality metrics, and best practice recommendations. A written personalized care plan for preventive services as well as general preventive health recommendations were provided to patient.     Barb Merino, LPN   29/52/[id number]   After Visit Summary: (MyChart) Due to this being a telephonic visit, the after visit summary with patients personalized plan was offered to patient via MyChart   Nurse Notes: none

## 2023-06-19 NOTE — Patient Instructions (Signed)
Debbie Barry , Thank you for taking time to come for your Medicare Wellness Visit. I appreciate your ongoing commitment to your health goals. Please review the following plan we discussed and let me know if I can assist you in the future.   Referrals/Orders/Follow-Ups/Clinician Recommendations: none  This is a list of the screening recommended for you and due dates:  Health Maintenance  Topic Date Due   COVID-19 Vaccine (3 - Pfizer risk series) 07/05/2023*   Colon Cancer Screening  01/22/2024   Medicare Annual Wellness Visit  06/18/2024   Mammogram  02/24/2025   DTaP/Tdap/Td vaccine (2 - Td or Tdap) 10/15/2025   Pneumonia Vaccine  Completed   Flu Shot  Completed   DEXA scan (bone density measurement)  Completed   Hepatitis C Screening  Completed   Zoster (Shingles) Vaccine  Completed   HPV Vaccine  Aged Out  *Topic was postponed. The date shown is not the original due date.    Advanced directives: (ACP Link)Information on Advanced Care Planning can be found at [facility] Advance Health Care Directives Advance Health Care Directives ([email or web address]   Next Medicare Annual Wellness Visit scheduled for next year: No, office will schedule  Insert Preventive Care attachment Insert FALL PREVENTION attachment if needed

## 2023-06-19 NOTE — Progress Notes (Signed)
Cc: home health certification  Received home health orders orders from [facility]. Start of care 04/09/23.   Certification and orders from 04/09/23 through 06/07/23 are reviewed, signed and faxed back to home health company.  Need of intermittent skilled services at home: SN, PT, OT  The home health care plan has been established by me and will be reviewed and updated as needed to maximize patient recovery.  I certify that all home health services have been and will be furnished to the patient while under my care.  Face-to-face encounter in which the need for home health services was established: 04/07/23  Patient is receiving home health services for the following diagnoses: Problem List Items Addressed This Visit       Cardiovascular and Mediastinum   Nonischemic cardiomyopathy (HCC)   CAD (coronary artery disease) - Primary   Hypertensive heart disease with chronic systolic congestive heart failure (HCC)     Other   Depression   Lumbar spinal stenosis   Primary insomnia   Other Visit Diagnoses       Body mass index 32.0-32.9, adult [Z68.32]         Fatty liver [K76.0]            Gwynneth Aliment, MD

## 2023-06-23 ENCOUNTER — Other Ambulatory Visit: Payer: Self-pay | Admitting: Pharmacist

## 2023-06-23 NOTE — Progress Notes (Unsigned)
Pharmacy Quality Measure Review  This patient is appearing on report for being at risk of failing the measure for Statin Therapy for Patients with Cardiovascular Disease [facility]) medications this calendar year.   Diagnosis of coronary artery disease, though non obstructive. Note of peripheral vascular disease. Documentation of aortic atherosclerosis on imaging.   Lipid Panel     Component Value Date/Time   CHOL 138 07/25/2022 1202   TRIG 130 07/25/2022 1202   HDL 62 07/25/2022 1202   CHOLHDL 2.2 07/25/2022 1202   LDLCALC 54 07/25/2022 1202   LABVLDL 22 07/25/2022 1202    Clinical ASCVD: No  The 10-year ASCVD risk score (Arnett DK, et al., 2019) is: 10%   Values used to calculate the score:     Age: 72 years     Sex: Female     Is Non-Hispanic African American: Yes     Diabetic: No     Tobacco smoker: No     Systolic Blood Pressure: 138 mmHg     Is BP treated: Yes     HDL Cholesterol: 62 mg/dL     Total Cholesterol: 138 mg/dL    PREVENT Risk Score: 10 year risk of CVD: 11.8% - 10 year risk of ASCVD: 5.7% - 10 year risk of HF: 6.1%  Will discuss with PCP.   Catie Eppie Gibson, PharmD, BCACP, CPP Clinical Pharmacist [facility] [phone]

## 2023-06-26 MED ORDER — ROSUVASTATIN CALCIUM 20 MG PO TABS: 20.0000 mg | ORAL_TABLET | Freq: Every day | ORAL | 3 refills | Status: AC

## 2023-06-26 NOTE — Progress Notes (Signed)
Care Coordination Call  Discussed with PCP, agree with high intensity statin given elevated ASCVD risk and risk factors. Recommend to initiate rosuvastatin 20 mg daily. Order placed.   Contacted patient to discuss, left voicemail for her to return my call at her convenience.   Catie Eppie Gibson, PharmD, BCACP, CPP Clinical Pharmacist [facility] [phone]

## 2023-06-30 NOTE — Progress Notes (Signed)
Called patient, left voicemail. Will send MyChart.   Catie Eppie Gibson, PharmD, BCACP, CPP Clinical Pharmacist [facility] [phone]

## 2023-07-07 ENCOUNTER — Encounter: Payer: Self-pay | Admitting: Internal Medicine

## 2023-07-07 ENCOUNTER — Ambulatory Visit (INDEPENDENT_AMBULATORY_CARE_PROVIDER_SITE_OTHER): Payer: Medicare HMO | Admitting: Internal Medicine

## 2023-07-07 VITALS — BP 130/84 | HR 92 | Temp 97.6°F | Ht 61.0 in | Wt 168.0 lb

## 2023-07-07 DIAGNOSIS — K219 Gastro-esophageal reflux disease without esophagitis: Secondary | ICD-10-CM

## 2023-07-07 DIAGNOSIS — I5022 Chronic systolic (congestive) heart failure: Secondary | ICD-10-CM | POA: Diagnosis not present

## 2023-07-07 DIAGNOSIS — F5101 Primary insomnia: Secondary | ICD-10-CM

## 2023-07-07 DIAGNOSIS — I110 Hypertensive heart disease with heart failure: Secondary | ICD-10-CM

## 2023-07-07 DIAGNOSIS — D696 Thrombocytopenia, unspecified: Secondary | ICD-10-CM

## 2023-07-07 DIAGNOSIS — I2510 Atherosclerotic heart disease of native coronary artery without angina pectoris: Secondary | ICD-10-CM | POA: Diagnosis not present

## 2023-07-07 DIAGNOSIS — Z6831 Body mass index (BMI) 31.0-31.9, adult: Secondary | ICD-10-CM

## 2023-07-07 DIAGNOSIS — E66811 Obesity, class 1: Secondary | ICD-10-CM | POA: Diagnosis not present

## 2023-07-07 DIAGNOSIS — I428 Other cardiomyopathies: Secondary | ICD-10-CM

## 2023-07-07 DIAGNOSIS — E6609 Other obesity due to excess calories: Secondary | ICD-10-CM

## 2023-07-07 DIAGNOSIS — M48062 Spinal stenosis, lumbar region with neurogenic claudication: Secondary | ICD-10-CM

## 2023-07-07 DIAGNOSIS — F331 Major depressive disorder, recurrent, moderate: Secondary | ICD-10-CM | POA: Diagnosis not present

## 2023-07-07 NOTE — Patient Instructions (Signed)
 Hypertension, Adult Hypertension is another name for high blood pressure. High blood pressure forces your heart to work harder to pump blood. This can cause problems over time. There are two numbers in a blood pressure reading. There is a top number (systolic) over a bottom number (diastolic). It is best to have a blood pressure that is below 120/80. What are the causes? The cause of this condition is not known. Some other conditions can lead to high blood pressure. What increases the risk? Some lifestyle factors can make you more likely to develop high blood pressure: Smoking. Not getting enough exercise or physical activity. Being overweight. Having too much fat, sugar, calories, or salt (sodium) in your diet. Drinking too much alcohol. Other risk factors include: Having any of these conditions: Heart disease. Diabetes. High cholesterol. Kidney disease. Obstructive sleep apnea. Having a family history of high blood pressure and high cholesterol. Age. The risk increases with age. Stress. What are the signs or symptoms? High blood pressure may not cause symptoms. Very high blood pressure (hypertensive crisis) may cause: Headache. Fast or uneven heartbeats (palpitations). Shortness of breath. Nosebleed. Vomiting or feeling like you may vomit (nauseous). Changes in how you see. Very bad chest pain. Feeling dizzy. Seizures. How is this treated? This condition is treated by making healthy lifestyle changes, such as: Eating healthy foods. Exercising more. Drinking less alcohol. Your doctor may prescribe medicine if lifestyle changes do not help enough and if: Your top number is above 130. Your bottom number is above 80. Your personal target blood pressure may vary. Follow these instructions at home: Eating and drinking  If told, follow the DASH eating plan. To follow this plan: Fill one half of your plate at each meal with fruits and vegetables. Fill one fourth of your plate  at each meal with whole grains. Whole grains include whole-wheat pasta, brown rice, and whole-grain bread. Eat or drink low-fat dairy products, such as skim milk or low-fat yogurt. Fill one fourth of your plate at each meal with low-fat (lean) proteins. Low-fat proteins include fish, chicken without skin, eggs, beans, and tofu. Avoid fatty meat, cured and processed meat, or chicken with skin. Avoid pre-made or processed food. Limit the amount of salt in your diet to less than 1,500 mg each day. Do not drink alcohol if: Your doctor tells you not to drink. You are pregnant, may be pregnant, or are planning to become pregnant. If you drink alcohol: Limit how much you have to: 0-1 drink a day for women. 0-2 drinks a day for men. Know how much alcohol is in your drink. In the [place] one drink equals one 12 oz bottle of beer (355 mL), one 5 oz glass of wine (148 mL), or one 1 oz glass of hard liquor (44 mL). Lifestyle  Work with your doctor to stay at a healthy weight or to lose weight. Ask your doctor what the best weight is for you. Get at least 30 minutes of exercise that causes your heart to beat faster (aerobic exercise) most days of the week. This may include walking, swimming, or biking. Get at least 30 minutes of exercise that strengthens your muscles (resistance exercise) at least 3 days a week. This may include lifting weights or doing Pilates. Do not smoke or use any products that contain nicotine or tobacco. If you need help quitting, ask your doctor. Check your blood pressure at home as told by your doctor. Keep all follow-up visits. Medicines Take over-the-counter and prescription medicines  only as told by your doctor. Follow directions carefully. Do not skip doses of blood pressure medicine. The medicine does not work as well if you skip doses. Skipping doses also puts you at risk for problems. Ask your doctor about side effects or reactions to medicines that you should watch  for. Contact a doctor if: You think you are having a reaction to the medicine you are taking. You have headaches that keep coming back. You feel dizzy. You have swelling in your ankles. You have trouble with your vision. Get help right away if: You get a very bad headache. You start to feel mixed up (confused). You feel weak or numb. You feel faint. You have very bad pain in your: Chest. Belly (abdomen). You vomit more than once. You have trouble breathing. These symptoms may be an emergency. Get help right away. Call 911. Do not wait to see if the symptoms will go away. Do not drive yourself to the hospital. Summary Hypertension is another name for high blood pressure. High blood pressure forces your heart to work harder to pump blood. For most people, a normal blood pressure is less than 120/80. Making healthy choices can help lower blood pressure. If your blood pressure does not get lower with healthy choices, you may need to take medicine. This information is not intended to replace advice given to you by your health care provider. Make sure you discuss any questions you have with your health care provider. Document Revised: 04/05/2021 Document Reviewed: 04/05/2021 Elsevier Patient Education  2024 [employer].

## 2023-07-07 NOTE — Progress Notes (Signed)
 I,[name], CMA,acting as a neurosurgeon for [name], MD.,have documented all relevant documentation on the behalf of [name], MD,as directed by  [name], MD while in the presence of [name], MD.  Subjective:  Patient ID: Debbie Barry , female    DOB: 07-04-1950 , 73 y.o.   MRN: [id number]  Chief Complaint  Patient presents with   Hypertension    HPI  Patient presents today for bpc. She reports compliance with medication. Denies headache, chest pain & sob.   She wants to know why Rosuvastatin  20mg  was recently added. Considering all of the other medications she's on.   Hypertension This is a chronic problem. The current episode started more than 1 year ago. Pertinent negatives include no blurred vision or chest pain. Risk factors for coronary artery disease include obesity, sedentary lifestyle and post-menopausal state. Past treatments include ACE inhibitors, calcium  channel blockers and diuretics. The current treatment provides mild improvement.     Past Medical History:  Diagnosis Date   Fatty liver    GERD (gastroesophageal reflux disease)    Hypertension    Nausea and vomiting 03/31/2021   Osteomyelitis (HCC)    right third toe   Peripheral vascular disease (HCC)    Ulcer 08/14/20     Family History  Problem Relation Age of Onset   Breast cancer Mother    Colon cancer Mother    Heart Problems Mother    Hypertension Mother    Multiple myeloma Mother    Arthritis Mother    Cancer Mother    Heart disease Mother    Hypertension Father    Heart disease Father    Multiple myeloma Father    Arthritis Father    Cancer Father      Current Outpatient Medications:    acetaminophen  (TYLENOL ) 500 MG tablet, Take 2 tablets (1,000 mg total) by mouth 3 (three) times daily., Disp: 30 tablet, Rfl: 0   apixaban  (ELIQUIS ) 5 MG TABS tablet, TAKE 1 TABLET(5 MG) BY MOUTH TWICE DAILY (Patient taking differently: Take 5 mg by mouth 2 (two)  times daily.), Disp: 60 tablet, Rfl: 1   Cholecalciferol (VITAMIN D3) 5000 units CAPS, Take 5,000 Units by mouth daily., Disp: , Rfl:    Magnesium  500 MG TABS, Take 1 tablet by mouth daily., Disp: , Rfl:    metoprolol  succinate (TOPROL  XL) 25 MG 24 hr tablet, Take 1 tablet (25 mg total) by mouth daily., Disp: 90 tablet, Rfl: 3   Multiple Vitamin (MULTIVITAMIN WITH MINERALS) TABS tablet, Take 1 tablet by mouth daily., Disp: , Rfl:    OVER THE COUNTER MEDICATION, Take 1 capsule by mouth daily. Patient takes a circulation pill from [employer], last took on Thursday., Disp: , Rfl:    pantoprazole  (PROTONIX ) 40 MG tablet, Take 1 tablet (40 mg total) by mouth daily., Disp: 90 tablet, Rfl: 2   potassium chloride  SA (KLOR-CON  M) 20 MEQ tablet, TAKE 1 TABLET(20 MEQ) BY MOUTH TWICE DAILY (Patient taking differently: Take 20 mEq by mouth 2 (two) times daily.), Disp: 60 tablet, Rfl: 2   rosuvastatin  (CRESTOR ) 20 MG tablet, Take 1 tablet (20 mg total) by mouth daily., Disp: 90 tablet, Rfl: 3   sacubitril -valsartan  (ENTRESTO ) 24-26 MG, Take 1 tablet by mouth 2 (two) times daily., Disp: 60 tablet, Rfl: 3   sertraline  (ZOLOFT ) 50 MG tablet, Take 1 tablet (50 mg total) by mouth daily., Disp: 90 tablet, Rfl: 2   spironolactone  (ALDACTONE ) 25 MG  tablet, Take 0.5 tablets (12.5 mg total) by mouth daily., Disp: 45 tablet, Rfl: 3   sucralfate  (CARAFATE ) 1 g tablet, TAKE 1 TABLET(1 GRAM) BY MOUTH TWICE DAILY prn, Disp: 60 tablet, Rfl: 3   tetrahydrozoline 0.05 % ophthalmic solution, Place 1 drop into both eyes daily., Disp: , Rfl:    torsemide  (DEMADEX ) 20 MG tablet, Take 1 tablet (20 mg total) by mouth daily as needed (swelling)., Disp: 20 tablet, Rfl: 1   Allergies  Allergen Reactions   Carvedilol  Diarrhea     Review of Systems  Constitutional: Negative.   Eyes:  Negative for blurred vision.  Respiratory: Negative.    Cardiovascular: Negative.  Negative for chest pain.  Gastrointestinal: Negative.   Neurological:  Negative.   Psychiatric/Behavioral: Negative.       Today's Vitals   07/07/23 1456  BP: 130/84  Pulse: 92  Temp: 97.6 F (36.4 C)  SpO2: 98%  Weight: 168 lb (76.2 kg)  Height: 5' 1 (1.549 m)   Body mass index is 31.74 kg/m.  Wt Readings from Last 3 Encounters:  07/07/23 168 lb (76.2 kg)  04/05/23 174 lb (78.9 kg)  03/10/23 174 lb 6.4 oz (79.1 kg)     Objective:  Physical Exam Vitals and nursing note reviewed.  Constitutional:      Appearance: Normal appearance. She is obese.  HENT:     Head: Normocephalic and atraumatic.  Eyes:     Extraocular Movements: Extraocular movements intact.  Cardiovascular:     Rate and Rhythm: Normal rate and regular rhythm.     Heart sounds: Normal heart sounds.  Pulmonary:     Effort: Pulmonary effort is normal.     Breath sounds: Normal breath sounds.  Musculoskeletal:     Cervical back: Normal range of motion.  Skin:    General: Skin is warm.  Neurological:     General: No focal deficit present.     Mental Status: She is alert.  Psychiatric:        Mood and Affect: Mood normal.        Behavior: Behavior normal.         Assessment And Plan:  Hypertensive heart disease with chronic systolic congestive heart failure (HCC) [I11.0, I50.22] Assessment & Plan: Chronic, fair control.  She is now stable on Entresto  24/26mg  twice daily, spironolactone  25mg  1/2 ab daily, Toprol  XL 25mg  and demadex . She is reminded to follow a low sodium diet and to keep f/u Cardiology appointments.   Orders: -     CMP14+EGFR  Coronary artery disease involving native coronary artery of native heart without angina pectoris [I25.10] Assessment & Plan: Chronic, LDL goal is less than 70.  She was advised that statin therapy is indicated and reminded her of conversation with CCM/[facility]BCI pharmacist. She is encouraged to start with M-F dosing. Possible side effects were discussed with the patient.     Nonischemic cardiomyopathy (HCC) [I42.8] Assessment &  Plan: Chronic, importance of salt restriction was discussed with the patient. Most recent Cardiology notes were reviewed.  She will continue with Entresto , metoprolol , and spironolactone .  Importance of dietary/medication compliance was discussed with the patient in detail.    Thrombocytopenia (HCC) -     CBC  Moderate episode of recurrent major depressive disorder (HCC) [F33.1] Assessment & Plan: Chronic, sx are well controlled with sertraline  50mg  daily. Importance of medication compliance was discussed with the patient.    Class 1 obesity due to excess calories with serious comorbidity and body mass index (  BMI) of 31.0 to 31.9 in adult Assessment & Plan: She is encouraged to aim for at least 150 minutes of exercise/week, while striving for BMI<30 to decrease cardiac risk.    She is encouraged to strive for BMI less than 30 to decrease cardiac risk. Advised to aim for at least 150 minutes of exercise per week.    Return in 6 months (on 01/04/2024), or PHYSICAL EXAM, for 8 WEEKS - CHOL CHECK/ALT - LAB VISIT.  Patient was given opportunity to ask questions. Patient verbalized understanding of the plan and was able to repeat key elements of the plan. All questions were answered to their satisfaction.    I, [name], MD, have reviewed all documentation for this visit. The documentation on 07/07/23 for the exam, diagnosis, procedures, and orders are all accurate and complete.   IF YOU HAVE BEEN REFERRED TO A SPECIALIST, IT MAY TAKE 1-2 WEEKS TO SCHEDULE/PROCESS THE REFERRAL. IF YOU HAVE NOT HEARD FROM US /SPECIALIST IN TWO WEEKS, PLEASE GIVE US  A CALL AT [phone].   THE PATIENT IS ENCOURAGED TO PRACTICE SOCIAL DISTANCING DUE TO THE COVID-19 PANDEMIC.

## 2023-07-08 ENCOUNTER — Other Ambulatory Visit: Payer: Self-pay | Admitting: Internal Medicine

## 2023-07-08 DIAGNOSIS — K760 Fatty (change of) liver, not elsewhere classified: Secondary | ICD-10-CM

## 2023-07-08 LAB — CBC
Hematocrit: 44.8 % (ref 34.0–46.6)
Hemoglobin: 14.3 g/dL (ref 11.1–15.9)
MCH: 33.3 pg — ABNORMAL HIGH (ref 26.6–33.0)
MCHC: 31.9 g/dL (ref 31.5–35.7)
MCV: 104 fL — ABNORMAL HIGH (ref 79–97)
Platelets: 142 10*3/uL — ABNORMAL LOW (ref 150–450)
RBC: 4.3 x10E6/uL (ref 3.77–5.28)
RDW: 13.4 % (ref 11.7–15.4)
WBC: 4.3 10*3/uL (ref 3.4–10.8)

## 2023-07-08 LAB — CMP14+EGFR
ALT: 31 [IU]/L (ref 0–32)
AST: 54 [IU]/L — ABNORMAL HIGH (ref 0–40)
Albumin: 3.3 g/dL — ABNORMAL LOW (ref 3.9–4.9)
Alkaline Phosphatase: 190 [IU]/L — ABNORMAL HIGH (ref 44–121)
BUN/Creatinine Ratio: 12 (ref 12–28)
BUN: 8 mg/dL (ref 8–27)
Bilirubin Total: 0.5 mg/dL (ref 0.0–1.2)
CO2: 25 mmol/L (ref 20–29)
Calcium: 8.3 mg/dL — ABNORMAL LOW (ref 8.7–10.3)
Chloride: 102 mmol/L (ref 96–106)
Creatinine, Ser: 0.69 mg/dL (ref 0.57–1.00)
Globulin, Total: 2.7 g/dL (ref 1.5–4.5)
Glucose: 88 mg/dL (ref 70–99)
Potassium: 3.8 mmol/L (ref 3.5–5.2)
Sodium: 142 mmol/L (ref 134–144)
Total Protein: 6 g/dL (ref 6.0–8.5)
eGFR: 93 mL/min/{1.73_m2} (ref >59)

## 2023-07-09 ENCOUNTER — Ambulatory Visit: Payer: Medicare HMO | Admitting: Orthopedic Surgery

## 2023-07-12 DIAGNOSIS — D696 Thrombocytopenia, unspecified: Secondary | ICD-10-CM | POA: Insufficient documentation

## 2023-07-12 NOTE — Assessment & Plan Note (Signed)
 Chronic, fair control.  She is now stable on Entresto 24/26mg  twice daily, spironolactone 25mg  1/2 ab daily, Toprol XL 25mg  and demadex. She is reminded to follow a low sodium diet and to keep f/u Cardiology appointments.

## 2023-07-12 NOTE — Assessment & Plan Note (Signed)
 Chronic, LDL goal is less than 70.  She was advised that statin therapy is indicated and reminded her of conversation with CCM/[facility]I pharmacist. She is encouraged to start with M-F dosing. Possible side effects were discussed with the patient.

## 2023-07-12 NOTE — Assessment & Plan Note (Addendum)
 Chronic, importance of salt restriction was discussed with the patient. Most recent Cardiology notes were reviewed.  She will continue with Entresto , metoprolol , and spironolactone .  Importance of dietary/medication compliance was discussed with the patient in detail.

## 2023-07-12 NOTE — Assessment & Plan Note (Signed)
She is encouraged to aim for at least 150 minutes of exercise /week, while striving for BMI<30 to decrease cardiac risk.

## 2023-07-13 NOTE — Assessment & Plan Note (Signed)
 Chronic, sx are well controlled with sertraline 50mg  daily. Importance of medication compliance was discussed with the patient.

## 2023-07-21 ENCOUNTER — Telehealth: Payer: Self-pay | Admitting: Cardiovascular Disease

## 2023-07-21 NOTE — Telephone Encounter (Signed)
Patient calling in to see if she can get the grant again that helps with her medication. States states the one she has had has expire. Please advise

## 2023-07-21 NOTE — Telephone Encounter (Signed)
Spoke with pt, she is needing help with the entresto. She reports it was [employer]. She would like to see if she can get it for the eliquis as well.

## 2023-07-22 ENCOUNTER — Encounter: Payer: Self-pay | Admitting: Pharmacist

## 2023-07-22 NOTE — Telephone Encounter (Signed)
Her [employer] grant was renewed and I sent her info via mychart. There is no grants for Eliquis. Looks like she has a $250 deductible and then it will be $47/month for the rest of the year (unless she hits the 2000 cap)

## 2023-07-22 NOTE — Telephone Encounter (Signed)
 Called patient with no answer. Left message to call back

## 2023-07-23 NOTE — Telephone Encounter (Signed)
Patient did return call. Message already relayed through MyChart per Malena Peer, RPH-CPP. No questions at this time.

## 2023-07-23 NOTE — Telephone Encounter (Signed)
2nd attempt Called patient with no answer. Left message to call back.

## 2023-08-11 ENCOUNTER — Other Ambulatory Visit: Payer: Self-pay | Admitting: Internal Medicine

## 2023-08-11 DIAGNOSIS — M810 Age-related osteoporosis without current pathological fracture: Secondary | ICD-10-CM

## 2023-09-01 ENCOUNTER — Other Ambulatory Visit: Payer: Medicare HMO

## 2023-09-05 NOTE — Progress Notes (Unsigned)
 Office Visit Note  Patient: Debbie Barry             Date of Birth: 04-Nov-1950           MRN: [id number]             PCP: Dorothyann Peng, MD Referring: Dorothyann Peng, MD Visit Date: 09/19/2023 Occupation: @GUAROCC @  Subjective:  Discuss DEXA discuss DEXA and treatment options and treatment options   History of Present Illness: Debbie Barry is a 73 y.o. female who presents today for a new patient consultation.  DEXA updated on 05/28/2023: Left radius BMD 0.452 and T-score -4.0.  Right femoral neck BMD 0.530 with T-score -2.90.  FRAX 10-year probability of osteoporotic fracture 24% and hip fracture 6.8%.  Activities of Daily Living:  Patient reports morning stiffness for *** {minute/hour:19697}.   Patient {ACTIONS;DENIES/REPORTS:21021675::"Denies"} nocturnal pain.  Difficulty dressing/grooming: {ACTIONS;DENIES/REPORTS:21021675::"Denies"} Difficulty climbing stairs: {ACTIONS;DENIES/REPORTS:21021675::"Denies"} Difficulty getting out of chair: {ACTIONS;DENIES/REPORTS:21021675::"Denies"} Difficulty using hands for taps, buttons, cutlery, and/or writing: {ACTIONS;DENIES/REPORTS:21021675::"Denies"}  No Rheumatology ROS completed.   PMFS History:  Patient Active Problem List   Diagnosis Date Noted  . Thrombocytopenia (HCC) 07/12/2023  . Estrogen deficiency 04/20/2023  . CAD (coronary artery disease) 04/06/2023  . Closed fracture of left tibial plateau 04/06/2023  . Gastroesophageal reflux disease without esophagitis 01/18/2023  . Primary insomnia 01/18/2023  . Major depression, recurrent, chronic (HCC) 12/31/2022  . Vitamin D deficiency disease 08/03/2022  . Weight gain 08/03/2022  . Class 1 obesity due to excess calories with serious comorbidity and body mass index (BMI) of 31.0 to 31.9 in adult 08/03/2022  . Hypertensive heart disease with chronic systolic congestive heart failure (HCC) 07/25/2022  . Cardiomyopathy (HCC)   . Nonischemic cardiomyopathy (HCC)   . DVT  (deep venous thrombosis) (HCC) 05/03/2021  . Renal infarct (HCC) 05/03/2021  . Lumbar spinal stenosis 05/03/2021  . Anasarca 05/03/2021  . Weakness of left lower extremity 04/20/2021  . Asymptomatic bacteriuria 04/20/2021  . Prolonged QT interval 04/20/2021  . Gastritis 03/31/2021  . Iron overload 02/13/2021  . Neuropathy 02/13/2021  . Elevated LFTs 02/13/2021  . Weight loss, non-intentional 02/13/2021  . Falls frequently 02/13/2021  . GI bleed 08/13/2020  . UGIB (upper gastrointestinal bleed) 08/12/2020  . Depression 08/12/2020  . Essential hypertension, benign 03/23/2019    Past Medical History:  Diagnosis Date  . Fatty liver   . GERD (gastroesophageal reflux disease)   . Hypertension   . Nausea and vomiting 03/31/2021  . Osteomyelitis (HCC)    right third toe  . Peripheral vascular disease (HCC)   . Ulcer 08/14/20    Family History  Problem Relation Age of Onset  . Breast cancer Mother   . Colon cancer Mother   . Heart Problems Mother   . Hypertension Mother   . Multiple myeloma Mother   . Arthritis Mother   . Cancer Mother   . Heart disease Mother   . Hypertension Father   . Heart disease Father   . Multiple myeloma Father   . Arthritis Father   . Cancer Father    Past Surgical History:  Procedure Laterality Date  . AMPUTATION Right 08/08/2017   Procedure: AMPUTATION RIGHT 3RD TOE;  Surgeon: Nadara Mustard, MD;  [facility];  Service: Orthopedics;  Laterality: Right;  . BIOPSY  08/14/2020   Procedure: BIOPSY;  Surgeon: Vida Rigger, MD;  Location: [facility] ENDOSCOPY;  Service: Endoscopy;;  . Bunionectomy Right 2006     Right  . Bunionectony  Left 2006      August  . COLONOSCOPY N/A 01/21/2014   Procedure: COLONOSCOPY;  Surgeon: Theda Belfast, MD;  Location: [facility] ENDOSCOPY;  Service: Endoscopy;  Laterality: N/A;  . ESOPHAGOGASTRODUODENOSCOPY (EGD) WITH PROPOFOL N/A 08/14/2020   Procedure: ESOPHAGOGASTRODUODENOSCOPY (EGD) WITH PROPOFOL;  Surgeon: Vida Rigger,  MD;  Location: [facility] ENDOSCOPY;  Service: Endoscopy;  Laterality: N/A;  . IR THORACENTESIS ASP PLEURAL SPACE W/IMG GUIDE  05/03/2021  . RIGHT/LEFT HEART CATH AND CORONARY ANGIOGRAPHY N/A 05/11/2021   Procedure: RIGHT/LEFT HEART CATH AND CORONARY ANGIOGRAPHY;  Surgeon: Lennette Bihari, MD;  Location: [facility] IN[facility]AS[facility] LAB;  Service: Cardiovascular;  Laterality: N/A;  . TOTAL HIP ARTHROPLASTY Left 05/11/2022  . TUBAL LIGATION    . WISDOM TOOTH EXTRACTION     Social History   Social History Narrative  . Not on file   Immunization History  Administered Date(s) Administered  . Fluad Trivalent(High Dose 65+) 04/07/2023  . Influenza Split 07/11/2014  . Influenza, High Dose Seasonal PF 03/23/2019  . Influenza, Quadrivalent, Recombinant, Inj, Pf 03/30/2018  . Influenza,inj,Quad PF,6+ Mos 03/23/2017  . Influenza-Unspecified 03/30/2018, 04/29/2022  . PFIZER(Purple Top)SARS-COV-2 Vaccination 09/16/2019, 10/07/2019  . PNEUMOCOCCAL CONJUGATE-20 12/31/2022  . Pneumococcal Polysaccharide-23 03/23/2019  . Tdap 10/16/2015  . Zoster Recombinant(Shingrix) 05/28/2019, 08/03/2019     Objective: Vital Signs: There were no vitals taken for this visit.   Physical Exam Vitals and nursing note reviewed.  Constitutional:      Appearance: She is well-developed.  HENT:     Head: Normocephalic and atraumatic.  Eyes:     Conjunctiva/sclera: Conjunctivae normal.  Cardiovascular:     Rate and Rhythm: Normal rate and regular rhythm.     Heart sounds: Normal heart sounds.  Pulmonary:     Effort: Pulmonary effort is normal.     Breath sounds: Normal breath sounds.  Abdominal:     General: Bowel sounds are normal.     Palpations: Abdomen is soft.  Musculoskeletal:     Cervical back: Normal range of motion.  Lymphadenopathy:     Cervical: No cervical adenopathy.  Skin:    General: Skin is warm and dry.     Capillary Refill: Capillary refill takes less than 2 seconds.  Neurological:     Mental Status: She  is alert and oriented to person, place, and time.  Psychiatric:        Behavior: Behavior normal.     Musculoskeletal Exam: ***  CDAI Exam: CDAI Score: -- Patient Global: --; Provider Global: -- Swollen: --; Tender: -- Joint Exam 09/19/2023   No joint exam has been documented for this visit   There is currently no information documented on the homunculus. Go to the Rheumatology activity and complete the homunculus joint exam.  Investigation: No additional findings.  Imaging: No results found.  Recent Labs: Lab Results  Component Value Date   WBC 4.3 07/07/2023   HGB 14.3 07/07/2023   PLT 142 (L) 07/07/2023   NA 142 07/07/2023   K 3.8 07/07/2023   CL 102 07/07/2023   CO2 25 07/07/2023   GLUCOSE 88 07/07/2023   BUN 8 07/07/2023   CREATININE 0.69 07/07/2023   BILITOT 0.5 07/07/2023   ALKPHOS 190 (H) 07/07/2023   AST 54 (H) 07/07/2023   ALT 31 07/07/2023   PROT 6.0 07/07/2023   ALBUMIN 3.3 (L) 07/07/2023   CALCIUM 8.3 (L) 07/07/2023   GFRAA 105 08/22/2020    Speciality Comments: No specialty comments available.  Procedures:  No procedures  performed Allergies: Carvedilol   Assessment / Plan:     Visit Diagnoses: Age-related osteoporosis without current pathological fracture  Vitamin D deficiency disease  Estrogen deficiency  Falls frequently  Coronary artery disease involving native coronary artery of native heart without angina pectoris  Ischemic cardiomyopathy  Acute deep vein thrombosis (DVT) of proximal vein of left lower extremity (HCC)  Essential hypertension, benign  Hypertensive heart disease with chronic systolic congestive heart failure (HCC)  Nonischemic cardiomyopathy (HCC)  Renal infarct (HCC)  Gastroesophageal reflux disease without esophagitis  UGIB (upper gastrointestinal bleed)  Neuropathy  Weakness of left lower extremity  Thrombocytopenia (HCC)  Elevated LFTs  Iron overload  Spinal stenosis of lumbar region,  unspecified whether neurogenic claudication present  Major depression, recurrent, chronic (HCC)  Primary insomnia  Prolonged QT interval  Orders: No orders of the defined types were placed in this encounter.  No orders of the defined types were placed in this encounter.   Face-to-face time spent with patient was *** minutes. Greater than 50% of time was spent in counseling and coordination of care.  Follow-Up Instructions: No follow-ups on file.   Gearldine Bienenstock, PA-C  Note - This record has been created using Dragon software.  Chart creation errors have been sought, but may not always  have been located. Such creation errors do not reflect on  the standard of medical care.

## 2023-09-19 ENCOUNTER — Ambulatory Visit: Payer: Medicare HMO | Attending: Physician Assistant | Admitting: Physician Assistant

## 2023-09-19 ENCOUNTER — Encounter: Payer: Self-pay | Admitting: Physician Assistant

## 2023-09-19 VITALS — BP 121/75 | HR 69 | Resp 16 | Ht 60.5 in | Wt 177.2 lb

## 2023-09-19 DIAGNOSIS — D696 Thrombocytopenia, unspecified: Secondary | ICD-10-CM

## 2023-09-19 DIAGNOSIS — I428 Other cardiomyopathies: Secondary | ICD-10-CM

## 2023-09-19 DIAGNOSIS — I10 Essential (primary) hypertension: Secondary | ICD-10-CM | POA: Diagnosis not present

## 2023-09-19 DIAGNOSIS — I824Y2 Acute embolism and thrombosis of unspecified deep veins of left proximal lower extremity: Secondary | ICD-10-CM | POA: Diagnosis not present

## 2023-09-19 DIAGNOSIS — I5022 Chronic systolic (congestive) heart failure: Secondary | ICD-10-CM

## 2023-09-19 DIAGNOSIS — E2839 Other primary ovarian failure: Secondary | ICD-10-CM

## 2023-09-19 DIAGNOSIS — I255 Ischemic cardiomyopathy: Secondary | ICD-10-CM

## 2023-09-19 DIAGNOSIS — E559 Vitamin D deficiency, unspecified: Secondary | ICD-10-CM | POA: Diagnosis not present

## 2023-09-19 DIAGNOSIS — R29898 Other symptoms and signs involving the musculoskeletal system: Secondary | ICD-10-CM

## 2023-09-19 DIAGNOSIS — K219 Gastro-esophageal reflux disease without esophagitis: Secondary | ICD-10-CM

## 2023-09-19 DIAGNOSIS — I110 Hypertensive heart disease with heart failure: Secondary | ICD-10-CM

## 2023-09-19 DIAGNOSIS — M810 Age-related osteoporosis without current pathological fracture: Secondary | ICD-10-CM

## 2023-09-19 DIAGNOSIS — R7989 Other specified abnormal findings of blood chemistry: Secondary | ICD-10-CM

## 2023-09-19 DIAGNOSIS — R5383 Other fatigue: Secondary | ICD-10-CM | POA: Diagnosis not present

## 2023-09-19 DIAGNOSIS — R296 Repeated falls: Secondary | ICD-10-CM

## 2023-09-19 DIAGNOSIS — F5101 Primary insomnia: Secondary | ICD-10-CM

## 2023-09-19 DIAGNOSIS — Z5181 Encounter for therapeutic drug level monitoring: Secondary | ICD-10-CM

## 2023-09-19 DIAGNOSIS — R9431 Abnormal electrocardiogram [ECG] [EKG]: Secondary | ICD-10-CM

## 2023-09-19 DIAGNOSIS — G629 Polyneuropathy, unspecified: Secondary | ICD-10-CM

## 2023-09-19 DIAGNOSIS — F339 Major depressive disorder, recurrent, unspecified: Secondary | ICD-10-CM

## 2023-09-19 DIAGNOSIS — I2510 Atherosclerotic heart disease of native coronary artery without angina pectoris: Secondary | ICD-10-CM | POA: Diagnosis not present

## 2023-09-19 DIAGNOSIS — N280 Ischemia and infarction of kidney: Secondary | ICD-10-CM

## 2023-09-19 DIAGNOSIS — K922 Gastrointestinal hemorrhage, unspecified: Secondary | ICD-10-CM

## 2023-09-19 DIAGNOSIS — M48061 Spinal stenosis, lumbar region without neurogenic claudication: Secondary | ICD-10-CM

## 2023-09-25 LAB — COMPREHENSIVE METABOLIC PANEL WITH GFR
AG Ratio: 1.3 (calc) (ref 1.0–2.5)
ALT: 28 U/L (ref 6–29)
AST: 48 U/L — ABNORMAL HIGH (ref 10–35)
Albumin: 3.5 g/dL — ABNORMAL LOW (ref 3.6–5.1)
Alkaline phosphatase (APISO): 134 U/L (ref 37–153)
BUN: 9 mg/dL (ref 7–25)
CO2: 25 mmol/L (ref 20–32)
Calcium: 8.5 mg/dL — ABNORMAL LOW (ref 8.6–10.4)
Chloride: 104 mmol/L (ref 98–110)
Creat: 0.74 mg/dL (ref 0.60–1.00)
Globulin: 2.8 g/dL (ref 1.9–3.7)
Glucose, Bld: 78 mg/dL (ref 65–99)
Potassium: 4.5 mmol/L (ref 3.5–5.3)
Sodium: 139 mmol/L (ref 135–146)
Total Bilirubin: 0.3 mg/dL (ref 0.2–1.2)
Total Protein: 6.3 g/dL (ref 6.1–8.1)
eGFR: 87 mL/min/{1.73_m2} (ref >=60)

## 2023-09-25 LAB — PARATHYROID HORMONE, INTACT (NO CA): PTH: 71 pg/mL (ref 16–77)

## 2023-09-25 LAB — PROTEIN ELECTROPHORESIS, SERUM, WITH REFLEX
Albumin ELP: 3.6 g/dL — ABNORMAL LOW (ref 3.8–4.8)
Alpha 1: 0.3 g/dL (ref 0.2–0.3)
Alpha 2: 0.6 g/dL (ref 0.5–0.9)
Beta 2: 0.4 g/dL (ref 0.2–0.5)
Beta Globulin: 0.4 g/dL (ref 0.4–0.6)
Gamma Globulin: 1 g/dL (ref 0.8–1.7)
Total Protein: 6.4 g/dL (ref 6.1–8.1)

## 2023-09-25 LAB — PHOSPHORUS: Phosphorus: 5 mg/dL — ABNORMAL HIGH (ref 2.1–4.3)

## 2023-09-25 LAB — TSH: TSH: 2.29 m[IU]/L (ref 0.40–4.50)

## 2023-09-25 LAB — VITAMIN D 25 HYDROXY (VIT D DEFICIENCY, FRACTURES): Vit D, 25-Hydroxy: 77 ng/mL (ref 30–100)

## 2023-09-25 LAB — IFE INTERPRETATION

## 2023-09-25 NOTE — Progress Notes (Signed)
 IFE--faint IgG (lambda) monoclonal immunoglobulin is detected-please notify the patient and place referral to hematology for further evaluation.

## 2023-09-28 ENCOUNTER — Other Ambulatory Visit: Payer: Self-pay | Admitting: Cardiovascular Disease

## 2023-09-29 ENCOUNTER — Other Ambulatory Visit: Payer: Self-pay | Admitting: *Deleted

## 2023-09-29 ENCOUNTER — Encounter: Payer: Self-pay | Admitting: *Deleted

## 2023-09-29 DIAGNOSIS — R899 Unspecified abnormal finding in specimens from other organs, systems and tissues: Secondary | ICD-10-CM

## 2023-10-03 NOTE — Progress Notes (Unsigned)
 Cardiology Clinic Note   Patient Name: Debbie Barry Date of Encounter: 10/07/2023  Primary Care Provider:  Dorothyann Peng, MD Primary Cardiologist:  Reatha Harps, MD  Patient Profile    Debbie Barry 73 year old female presents to the clinic today for follow-up evaluation of her chronic systolic CHF and hypertension.  Past Medical History    Past Medical History:  Diagnosis Date   Fatty liver    GERD (gastroesophageal reflux disease)    Hypertension    Nausea and vomiting 03/31/2021   Osteomyelitis (HCC)    right third toe   Peripheral vascular disease (HCC)    Ulcer 08/14/20   Past Surgical History:  Procedure Laterality Date   AMPUTATION Right 08/08/2017   Procedure: AMPUTATION RIGHT 3RD TOE;  Surgeon: Nadara Mustard, MD;  Location: [facility] OR;  Service: Orthopedics;  Laterality: Right;   BIOPSY  08/14/2020   Procedure: BIOPSY;  Surgeon: Vida Rigger, MD;  Location: [facility];  Service: Endoscopy;;   Bunionectomy Right 2006     Right   Bunionectony Left 2006      August   COLONOSCOPY N/A 01/21/2014   Procedure: COLONOSCOPY;  Surgeon: Theda Belfast, MD;  Location: [facility];  Service: Endoscopy;  Laterality: N/A;   ESOPHAGOGASTRODUODENOSCOPY (EGD) WITH PROPOFOL N/A 08/14/2020   Procedure: ESOPHAGOGASTRODUODENOSCOPY (EGD) WITH PROPOFOL;  Surgeon: Vida Rigger, MD;  Location: [facility];  Service: Endoscopy;  Laterality: N/A;   IR THORACENTESIS ASP PLEURAL SPACE W/IMG GUIDE  05/03/2021   RIGHT/LEFT HEART CATH AND CORONARY ANGIOGRAPHY N/A 05/11/2021   Procedure: RIGHT/LEFT HEART CATH AND CORONARY ANGIOGRAPHY;  Surgeon: Lennette Bihari, MD;  Location: [facility];  Service: Cardiovascular;  Laterality: N/A;   TOTAL HIP ARTHROPLASTY Left 05/11/2022   TUBAL LIGATION     WISDOM TOOTH EXTRACTION      Allergies  Allergies  Allergen Reactions   Carvedilol Diarrhea    History of Present Illness    [name] has a PMH of CHF,  nonobstructive CAD, DVT, and PMVT.  She did not tolerate carvedilol due to GI issues.  Her PMH also includes fatty liver disease, anasarca, severe spinal cord stenosis, acute right renal infarct, and GERD.  11/22 she had an episode of torsades/cardiac arrest/polymorphic VT in the setting of electrolyte derangements.  Her EF was 25-30% in 11/22, EF was 55-60% 3/23 in the setting of critical illness.  She underwent left heart cath which showed 20% LAD and otherwise nonobstructive coronary disease 11/22.  She was seen in follow-up by Dr. Bufford Buttner on 03/10/2023.  Her echocardiogram was reviewed.  Her shortness of breath was improving.  Overall she was doing well.  Her blood pressure was noted to be elevated.  She was continued on spironolactone and Entresto.  Metoprolol was added to her medication regimen.  She presents to the clinic today for follow-up evaluation and states she is planning to retire from [employer] at the end of the summer.  We reviewed her previous cardiology clinic visit.  She expressed understanding.  She reports that her blood pressures have been well-controlled.  She notes that on some mornings she has been lightheaded.  She reports that she could hydrate better.  Her blood pressure today is 108/70.  She does note 1 episode of chest discomfort last week.  This happened in the setting of high stress related to work.  The episode lasted for about 1 minute and then dissipated with rest.  She did take 2 aspirin at  that time.  I will give her chair exercises, refill her metoprolol and plan follow-up in 6 months.  Today she denies chest pain, shortness of breath, lower extremity edema, fatigue, palpitations, melena, hematuria, hemoptysis, diaphoresis, weakness, presyncope, syncope, orthopnea, and PND.     Home Medications    Prior to Admission medications   Medication Sig Start Date End Date Taking? Authorizing Provider  acetaminophen (TYLENOL) 500 MG tablet Take 2 tablets (1,000 mg total) by  mouth 3 (three) times daily. Patient taking differently: Take 1,000 mg by mouth as needed. 04/07/23   Dahal, Melina Schools, MD  apixaban (ELIQUIS) 5 MG TABS tablet TAKE 1 TABLET(5 MG) BY MOUTH TWICE DAILY Patient taking differently: Take 5 mg by mouth 2 (two) times daily. 02/27/23   O'NealRonnald Ramp, MD  Cholecalciferol (VITAMIN D3) 5000 units CAPS Take 5,000 Units by mouth daily.    [provider]  Magnesium 500 MG TABS Take 1 tablet by mouth daily.    [provider]  metoprolol succinate (TOPROL XL) 25 MG 24 hr tablet Take 1 tablet (25 mg total) by mouth daily. 03/10/23   O'NealRonnald Ramp, MD  Multiple Vitamin (MULTIVITAMIN WITH MINERALS) TABS tablet Take 1 tablet by mouth daily.    [provider]  OVER THE COUNTER MEDICATION Take 1 capsule by mouth daily. Patient takes a circulation pill from [place], last took on Thursday. Patient not taking: Reported on 09/19/2023    [provider]  pantoprazole (PROTONIX) 40 MG tablet Take 1 tablet (40 mg total) by mouth daily. 06/19/23   Dorothyann Peng, MD  potassium chloride SA (KLOR-CON M) 20 MEQ tablet TAKE 1 TABLET(20 MEQ) BY MOUTH TWICE DAILY Patient taking differently: Take 20 mEq by mouth 2 (two) times daily. 09/20/22   O'NealRonnald Ramp, MD  rosuvastatin (CRESTOR) 20 MG tablet Take 1 tablet (20 mg total) by mouth daily. 06/26/23   Dorothyann Peng, MD  sacubitril-valsartan (ENTRESTO) 24-26 MG Take 1 tablet by mouth 2 (two) times daily. 09/25/22   Sande Rives, MD  sertraline (ZOLOFT) 50 MG tablet Take 1 tablet (50 mg total) by mouth daily. 02/26/23 02/26/24  Dorothyann Peng, MD  spironolactone (ALDACTONE) 25 MG tablet TAKE 1/2 TABLET(12.5 MG) BY MOUTH DAILY 09/29/23   O'Neal, Ronnald Ramp, MD  sucralfate (CARAFATE) 1 g tablet TAKE 1 TABLET(1 GRAM) BY MOUTH TWICE DAILY prn 12/31/22   Dorothyann Peng, MD  tetrahydrozoline 0.05 % ophthalmic solution Place 1 drop into both eyes daily.    [provider]   torsemide (DEMADEX) 20 MG tablet Take 1 tablet (20 mg total) by mouth daily as needed (swelling). 03/10/23 09/19/23  O'Neal, Ronnald Ramp, MD    Family History    Family History  Problem Relation Age of Onset   Breast cancer Mother    Colon cancer Mother    Heart Problems Mother    Hypertension Mother    Heart disease Mother    Rheum arthritis Mother    Hypertension Father    Heart disease Father    Multiple myeloma Father    Osteoarthritis Father    HIV/AIDS Brother    Hypertension Son    Hypertension Daughter    Lupus Cousin    Heart disease Cousin        has pacemaker   She indicated that her mother is deceased. She indicated that her father is deceased. She indicated that her brother is deceased. She indicated that her daughter is alive. She indicated that her son is  alive. She indicated that her cousin is alive.  Social History    Social History   Socioeconomic History   Marital status: Married    Spouse name: Not on file   Number of children: Not on file   Years of education: Not on file   Highest education level: Not on file  Occupational History   Not on file  Tobacco Use   Smoking status: Never    Passive exposure: Current   Smokeless tobacco: Never  Vaping Use   Vaping status: Never Used  Substance and Sexual Activity   Alcohol use: Yes    Comment: social glass of wine   Drug use: No   Sexual activity: Yes    Birth control/protection: Post-menopausal  Other Topics Concern   Not on file  Social History Narrative   Not on file   Social Drivers of Health   Financial Resource Strain: Low Risk  (06/19/2023)   Overall Financial Resource Strain (CARDIA)    Difficulty of Paying Living Expenses: Not hard at all  Food Insecurity: No Food Insecurity (06/19/2023)   Hunger Vital Sign    Worried About Running Out of Food in the Last Year: Never true    Ran Out of Food in the Last Year: Never true  Transportation Needs: No Transportation Needs (06/19/2023)    PRAPARE - Administrator, Civil Service (Medical): No    Lack of Transportation (Non-Medical): No  Physical Activity: Inactive (06/19/2023)   Exercise Vital Sign    Days of Exercise per Week: 0 days    Minutes of Exercise per Session: 0 min  Stress: No Stress Concern Present (06/19/2023)   [employer] of Occupational Health - Occupational Stress Questionnaire    Feeling of Stress : Not at all  Social Connections: Moderately Isolated (06/19/2023)   Social Connection and Isolation Panel [NHANES]    Frequency of Communication with Friends and Family: More than three times a week    Frequency of Social Gatherings with Friends and Family: Once a week    Attends Religious Services: Never    Database administrator or Organizations: No    Attends Banker Meetings: Never    Marital Status: Married  Catering manager Violence: Not At Risk (06/19/2023)   Humiliation, Afraid, Rape, and Kick questionnaire    Fear of Current or Ex-Partner: No    Emotionally Abused: No    Physically Abused: No    Sexually Abused: No     Review of Systems    General:  No chills, fever, night sweats or weight changes.  Cardiovascular:  No chest pain, dyspnea on exertion, edema, orthopnea, palpitations, paroxysmal nocturnal dyspnea. Dermatological: No rash, lesions/masses Respiratory: No cough, dyspnea Urologic: No hematuria, dysuria Abdominal:   No nausea, vomiting, diarrhea, bright red blood per rectum, melena, or hematemesis Neurologic:  No visual changes, wkns, changes in mental status. All other systems reviewed and are otherwise negative except as noted above.  Physical Exam    VS:  BP 108/70 (BP Location: Right Arm, Patient Position: Sitting, Cuff Size: Large)   Pulse 73   Ht 5' (1.524 m)   Wt 176 lb (79.8 kg)   SpO2 95%   BMI 34.37 kg/m  , BMI Body mass index is 34.37 kg/m. GEN: Well nourished, well developed, in no acute distress. HEENT: normal. Neck: Supple, no  JVD, carotid bruits, or masses. Cardiac: RRR, no murmurs, rubs, or gallops. No clubbing, cyanosis, edema.  Radials/DP/PT 2+ and equal  bilaterally.  Respiratory:  Respirations regular and unlabored, clear to auscultation bilaterally. GI: Soft, nontender, nondistended, BS + x 4. MS: no deformity or atrophy. Skin: warm and dry, no rash. Neuro:  Strength and sensation are intact. Psych: Normal affect.  Accessory Clinical Findings    Recent Labs: 04/06/2023: Magnesium 1.6 07/07/2023: Hemoglobin 14.3; Platelets 142 09/19/2023: ALT 28; BUN 9; Creat 0.74; Potassium 4.5; Sodium 139; TSH 2.29   Recent Lipid Panel    Component Value Date/Time   CHOL 138 07/25/2022 1202   TRIG 130 07/25/2022 1202   HDL 62 07/25/2022 1202   CHOLHDL 2.2 07/25/2022 1202   LDLCALC 54 07/25/2022 1202         ECG personally reviewed by me today-none today.EKG Interpretation Date/Time:  Tuesday October 07 2023 15:10:38 EDT Ventricular Rate:  73 PR Interval:  154 QRS Duration:  74 QT Interval:  406 QTC Calculation: 447 R Axis:   -52  Text Interpretation: Normal sinus rhythm Left anterior fascicular block Confirmed by Edd Fabian [phone]) on 10/07/2023 3:29:22 PM    10/21/2022  1. Left ventricular ejection fraction, by estimation, is 55 to 60%. Left  ventricular ejection fraction by 3D volume is 55 %. The left ventricle has  normal function. The left ventricle has no regional wall motion  abnormalities. Left ventricular diastolic   parameters are consistent with Grade I diastolic dysfunction (impaired  relaxation).   2. Right ventricular systolic function is normal. The right ventricular  size is normal.   3. The mitral valve is normal in structure. Mild mitral valve  regurgitation.   4. The aortic valve is tricuspid. Aortic valve regurgitation is not  visualized. Aortic valve sclerosis/calcification is present, without any  evidence of aortic stenosis.   5. The inferior vena cava is normal in size with  greater than 50%  respiratory variability, suggesting right atrial pressure of 3 mmHg.     Assessment & Plan   1.  Essential hypertension-BP today 108/70. Maintain blood pressure log Heart healthy low-sodium diet Increase physical activity as tolerated Continue Entresto, spironolactone, torsemide, metoprolol  Chronic systolic CHF-EF recovered to 55-60% on 10/21/2022.  Euvolemic.  Weight today 176 . Continue Entresto, spironolactone, torsemide, metoprolol Heart healthy low-sodium diet Daily weights-contact office with a weight increase of 2 to 3 pounds overnight or 5 pounds in 1 week.  Coronary artery disease-denies recent episodes of exertional chest discomfort.  Underwent LHC which showed nonobstructive CAD. Continue rosuvastatin, metoprolol  Hyperlipidemia-LDL 54 07/25/22. High-fiber diet Continue rosuvastatin Increase physical activity as tolerated Repeat fasting lipids and LFTs- with PCP  Disposition: Follow-up with Dr. Flora Lipps or me in 9-12 months.   Thomasene Ripple. Calandra Madura NP-C     10/07/2023, 4:22 PM [facility] [address] Office [phone] Fax [phone]    I spent 14 minutes examining this patient, reviewing medications, and using patient centered shared decision making involving their cardiac care.   I spent  20 minutes reviewing past medical history,  medications, and prior cardiac tests.

## 2023-10-07 ENCOUNTER — Ambulatory Visit: Attending: General Practice | Admitting: General Practice

## 2023-10-07 ENCOUNTER — Encounter: Payer: Self-pay | Admitting: General Practice

## 2023-10-07 VITALS — BP 108/70 | HR 73 | Ht 60.0 in | Wt 176.0 lb

## 2023-10-07 DIAGNOSIS — I2510 Atherosclerotic heart disease of native coronary artery without angina pectoris: Secondary | ICD-10-CM

## 2023-10-07 DIAGNOSIS — I5022 Chronic systolic (congestive) heart failure: Secondary | ICD-10-CM

## 2023-10-07 DIAGNOSIS — E782 Mixed hyperlipidemia: Secondary | ICD-10-CM

## 2023-10-07 DIAGNOSIS — I110 Hypertensive heart disease with heart failure: Secondary | ICD-10-CM

## 2023-10-07 MED ORDER — METOPROLOL SUCCINATE ER 25 MG PO TB24: 25.0000 mg | ORAL_TABLET | Freq: Every day | ORAL | 1 refills | Status: DC

## 2023-10-07 NOTE — Patient Instructions (Signed)
 Medication Instructions:  The current medical regimen is effective;  continue present plan and medications as directed. Please refer to the Current Medication list given to you today.  *If you need a refill on your cardiac medications before your next appointment, please call your pharmacy*  Lab Work:  Testing/Procedures: NONE   NONE  Follow-Up: At [facility], you and your health needs are our priority.  As part of our continuing mission to provide you with exceptional heart care, our providers are all part of one team.  This team includes your primary Cardiologist (physician) and Advanced Practice Providers or APPs (Physician Assistants and Nurse Practitioners) who all work together to provide you with the care you need, when you need it.  Your next appointment:   6 month(s)  Provider:   Reatha Harps, MD    Other Instructions PLEASE SEE ATTACHED CHAIR EXERCISES PLEASE READ AND FOLLOW ATTACHED INCREASED FIBER DIET INCREASE HYDRATION PLEASE READ AND FOLLOW ATTACHED  SALTY 6      High-Fiber Eating Plan Fiber, also called dietary fiber, is found in foods such as fruits, vegetables, whole grains, and beans. A high-fiber diet can be good for your health. Your health care provider may recommend a high-fiber diet to help: Prevent trouble pooping (constipation). Lower your cholesterol. Treat the following conditions: Hemorrhoids. This is inflammation of veins in the anus. Inflammation of specific areas of the digestive tract. Irritable bowel syndrome (IBS). This is a problem of the large intestine, also called the colon, that sometimes causes belly pain and bloating. Prevent overeating as part of a weight-loss plan. Lower the risk of heart disease, type 2 diabetes, and certain cancers. What are tips for following this plan? Reading food labels  Check the nutrition facts label on foods for the amount of dietary fiber. Choose foods that have 4 grams of fiber or more per  serving. The recommended goals for how much fiber you should eat each day include: Males 44 years old or younger: 30-34 g. Males over 63 years old: 28-34 g. Females 3 years old or younger: 25-28 g. Females over 71 years old: 22-25 g. Shopping Choose whole fruits and vegetables instead of processed. For example, choose apples instead of apple juice or applesauce. Choose a variety of high-fiber foods such as avocados, lentils, oats, and pinto beans. Read the nutrition facts label on foods. Check for foods with added fiber. These foods often have high sugar and salt (sodium) amounts per serving. Cooking Use whole-grain flour for baking and cooking. Cook with brown rice instead of white rice. Make meals that have a lot of beans and vegetables in them, such as chili or vegetable-based soups. Meal planning Start the day with a breakfast that is high in fiber, such as a cereal that has 5 g of fiber or more per serving. Eat breads and cereals that are made with whole-grain flour instead of refined flour or white flour. Eat brown rice, bulgur wheat, or millet instead of white rice. Use beans in place of meat in soups, salads, and pasta dishes. Be sure that half of the grains you eat each day are whole grains. General information You can get the recommended amount of dietary fiber by: Eating a variety of fruits, vegetables, grains, nuts, and beans. Taking a fiber supplement if you aren't able to eat enough fiber. It's better to get fiber through food than from a supplement. Slowly increase how much fiber you eat. If you increase the amount of fiber you eat  too quickly, you may have bloating, cramping, or gas. Drink plenty of water to help you digest fiber. Choose high-fiber snacks, such as berries, raw vegetables, nuts, and popcorn. What foods should I eat? Fruits Berries. Pears. Apples. Oranges. Avocado. Prunes and raisins. Dried figs. Vegetables Sweet potatoes. Spinach. Kale. Artichokes.  Cabbage. Broccoli. Cauliflower. Green peas. Carrots. Squash. Grains Whole-grain breads. Multigrain cereal. Oats and oatmeal. Brown rice. Barley. Bulgur wheat. Millet. Quinoa. Bran muffins. Popcorn. Rye wafer crackers. Meats and other proteins Navy beans, kidney beans, and pinto beans. Soybeans. Split peas. Lentils. Nuts and seeds. Dairy Fiber-fortified yogurt. Fortified means that fiber has been added to the product. Beverages Fiber-fortified soy milk. Fiber-fortified orange juice. Other foods Fiber bars. The items listed above may not be all the foods and drinks you can have. Talk to a dietitian to learn more. What foods should I avoid? Fruits Fruit juice. Cooked, strained fruit. Vegetables Fried potatoes. Canned vegetables. Well-cooked vegetables. Grains White bread. Pasta made with refined flour. White rice. Meats and other proteins Fatty meat. Fried chicken or fried fish. Dairy Milk. Cream cheese. Sour cream. Fats and oils Butters. Beverages Soft drinks. Other foods Cakes and pastries. The items listed above may not be all the foods and drinks you should avoid. Talk to a dietitian to learn more. This information is not intended to replace advice given to you by your health care provider. Make sure you discuss any questions you have with your health care provider. Document Revised: 09/09/2022 Document Reviewed: 09/09/2022 Elsevier Patient Education  2024 Elsevier Inc.   Exercises to do While Sitting  Exercises that you do while sitting (chair exercises) can give you many of the same benefits as full exercise. Benefits include strengthening your heart, burning calories, and keeping muscles and joints healthy. Exercise can also improve your mood and help with depression and anxiety. You may benefit from chair exercises if you are unable to do standing exercises due to: Diabetic foot pain. Obesity. Illness. Arthritis. Recovery from surgery or injury. Breathing  problems. Balance problems. Another type of disability. Before starting chair exercises, check with your health care provider or a physical therapist to find out how much exercise you can tolerate and which exercises are safe for you. If your health care provider approves: Start out slowly and build up over time. Aim to work up to about 10-20 minutes for each exercise session. Make exercise part of your daily routine. Drink water when you exercise. Do not wait until you are thirsty. Drink every 10-15 minutes. Stop exercising right away if you have pain, nausea, shortness of breath, or dizziness. If you are exercising in a wheelchair, make sure to lock the wheels. Ask your health care provider whether you can do tai chi or yoga. Many positions in these mind-body exercises can be modified to do while seated. Warm-up Before starting other exercises: Sit up as straight as you can. Have your knees bent at 90 degrees, which is the shape of the capital letter "L." Keep your feet flat on the floor. Sit at the front edge of your chair, if you can. Pull in (tighten) the muscles in your abdomen and stretch your spine and neck as straight as you can. Hold this position for a few minutes. Breathe in and out evenly. Try to concentrate on your breathing, and relax your mind. Stretching Exercise A: Arm stretch Hold your arms out straight in front of your body. Bend your hands at the wrist with your fingers pointing up, as if  signaling someone to stop. Notice the slight tension in your forearms as you hold the position. Keeping your arms out and your hands bent, rotate your hands outward as far as you can and hold this stretch. Aim to have your thumbs pointing up and your pinkie fingers pointing down. Slowly repeat arm stretches for one minute as tolerated. Exercise B: Leg stretch If you can move your legs, try to "draw" letters on the floor with the toes of your foot. Write your name with one foot. Write  your name with the toes of your other foot. Slowly repeat the movements for one minute as tolerated. Exercise C: Reach for the sky Reach your hands as far over your head as you can to stretch your spine. Move your hands and arms as if you are climbing a rope. Slowly repeat the movements for one minute as tolerated. Range of motion exercises Exercise A: Shoulder roll Let your arms hang loosely at your sides. Lift just your shoulders up toward your ears, then let them relax back down. When your shoulders feel loose, rotate your shoulders in backward and forward circles. Do shoulder rolls slowly for one minute as tolerated. Exercise B: March in place As if you are marching, pump your arms and lift your legs up and down. Lift your knees as high as you can. If you are unable to lift your knees, just pump your arms and move your ankles and feet up and down. March in place for one minute as tolerated. Exercise C: Seated jumping jacks Let your arms hang down straight. Keeping your arms straight, lift them up over your head. Aim to point your fingers to the ceiling. While you lift your arms, straighten your legs and slide your heels along the floor to your sides, as wide as you can. As you bring your arms back down to your sides, slide your legs back together. If you are unable to use your legs, just move your arms. Slowly repeat seated jumping jacks for one minute as tolerated. Strengthening exercises Exercise A: Shoulder squeeze Hold your arms straight out from your body to your sides, with your elbows bent and your fists pointed at the ceiling. Keeping your arms in the bent position, move them forward so your elbows and forearms meet in front of your face. Open your arms back out as wide as you can with your elbows still bent, until you feel your shoulder blades squeezing together. Hold for 5 seconds. Slowly repeat the movements forward and backward for one minute as tolerated. Contact a  health care provider if: You have to stop exercising due to any of the following: Pain. Nausea. Shortness of breath. Dizziness. Fatigue. You have significant pain or soreness after exercising. Get help right away if: You have chest pain. You have difficulty breathing. These symptoms may represent a serious problem that is an emergency. Do not wait to see if the symptoms will go away. Get medical help right away. Call your local emergency services (911 in the [place] Do not drive yourself to the hospital. Summary Exercises that you do while sitting (chair exercises) can strengthen your heart, burn calories, and keep muscles and joints healthy. You may benefit from chair exercises if you are unable to do standing exercises due to diabetic foot pain, obesity, recovery from surgery or injury, or other conditions. Before starting chair exercises, check with your health care provider or a physical therapist to find out how much exercise you can tolerate and which exercises  are safe for you. This information is not intended to replace advice given to you by your health care provider. Make sure you discuss any questions you have with your health care provider. Document Revised: 08/13/2020 Document Reviewed: 08/13/2020 Elsevier Patient Education  2024 Elsevier Inc.    1st Floor: - Lobby - Registration  - Pharmacy  - Lab - Cafe  2nd Floor: - PV Lab - Diagnostic Testing (echo, CT, nuclear med)  3rd Floor: - Vacant  4th Floor: - TCTS (cardiothoracic surgery) - AFib Clinic - Structural Heart Clinic - Vascular Surgery  - Vascular Ultrasound  5th Floor: - [facility] (general and EP) - Clinical Pharmacy for coumadin, hypertension, lipid, weight-loss medications, and med management appointments    Valet parking services will be available as well.

## 2023-10-13 ENCOUNTER — Telehealth: Payer: Self-pay | Admitting: Pharmacist

## 2023-10-13 NOTE — Telephone Encounter (Addendum)
 Reclast benefits investigation will be started after patient is cleared by dentist to start treatment  Wilmar Prabhakar, PharmD, MPH, BCPS, CPP Clinical Pharmacist (Rheumatology and Pulmonology)  ----- Message from [facility] Marissa G sent at 09/19/2023 12:21 PM EDT ----- Patient will possibly be starting Reclast IV pending upcoming dental work and clearance, per Jacinta Martinis, PA-C. Thanks!

## 2023-11-09 NOTE — Progress Notes (Deleted)
 Office Visit Note  Patient: Debbie Barry             Date of Birth: Jun 20, 1951           MRN: [id number]             PCP: Cleave Curling, MD Referring: Cleave Curling, MD Visit Date: 11/20/2023 Occupation: @GUAROCC @  Subjective:  No chief complaint on file.   History of Present Illness: Debbie Barry is a 73 y.o. female ***   Previous DEXA 02/07/20: The BMD measured at Femur Neck Left is 0.732 g/cm2 with a T-score of -2.2. DEXA updated on 05/28/2023: Left radius BMD 0.452 and T-score -4.0.  Right femoral neck BMD 0.530 with T-score -2.90.  FRAX 10-year probability of osteoporotic fracture 24% and hip fracture 6.8%. Left tibial plateau fracture--Date of injury: 04/06/2023-did not require surgical intervention.  Risk factors: estrogen deficient, vitamin D  deficient, frequent falls.   Not a good candidate for Evenity due to cardiovascular risk factors. Not a good candidate for oral bisphosphonates due to history of gastritis and reflux. Patient declined the use of Tymlos/Forteo due to the concern for daily injections. Concerned to initiate Prolia given her history of hypocalcemia-calcium  was 8.3 on 07/17/2023. She is taking Calcium  citrate 600 mg once daily.  Plan to check the following lab work today for further evaluation. Reviewed indications, contraindications, potential side effects of IV Reclast today in detail.  All questions were addressed.  The patient has dentures but will be having upcoming invasive dental work coming up within the next 1 to 2 months and will require clearance by her dentist prior to initiating IV Reclast.  She will notify us  once she has been cleared by her dentist at which time an order for Reclast can be placed.  Activities of Daily Living:  Patient reports morning stiffness for *** {minute/hour:19697}.   Patient {ACTIONS;DENIES/REPORTS:21021675::"Denies"} nocturnal pain.  Difficulty dressing/grooming:  {ACTIONS;DENIES/REPORTS:21021675::"Denies"} Difficulty climbing stairs: {ACTIONS;DENIES/REPORTS:21021675::"Denies"} Difficulty getting out of chair: {ACTIONS;DENIES/REPORTS:21021675::"Denies"} Difficulty using hands for taps, buttons, cutlery, and/or writing: {ACTIONS;DENIES/REPORTS:21021675::"Denies"}  No Rheumatology ROS completed.   PMFS History:  Patient Active Problem List   Diagnosis Date Noted   Thrombocytopenia (HCC) 07/12/2023   Estrogen deficiency 04/20/2023   CAD (coronary artery disease) 04/06/2023   Closed fracture of left tibial plateau 04/06/2023   Gastroesophageal reflux disease without esophagitis 01/18/2023   Primary insomnia 01/18/2023   Major depression, recurrent, chronic (HCC) 12/31/2022   Vitamin D  deficiency disease 08/03/2022   Weight gain 08/03/2022   Class 1 obesity due to excess calories with serious comorbidity and body mass index (BMI) of 31.0 to 31.9 in adult 08/03/2022   Hypertensive heart disease with chronic systolic congestive heart failure (HCC) 07/25/2022   Cardiomyopathy (HCC)    Nonischemic cardiomyopathy (HCC)    DVT (deep venous thrombosis) (HCC) 05/03/2021   Renal infarct (HCC) 05/03/2021   Lumbar spinal stenosis 05/03/2021   Anasarca 05/03/2021   Weakness of left lower extremity 04/20/2021   Asymptomatic bacteriuria 04/20/2021   Prolonged QT interval 04/20/2021   Gastritis 03/31/2021   Iron overload 02/13/2021   Neuropathy 02/13/2021   Elevated LFTs 02/13/2021   Weight loss, non-intentional 02/13/2021   Falls frequently 02/13/2021   GI bleed 08/13/2020   UGIB (upper gastrointestinal bleed) 08/12/2020   Depression 08/12/2020   Essential hypertension, benign 03/23/2019    Past Medical History:  Diagnosis Date   Fatty liver    GERD (gastroesophageal reflux disease)    Hypertension    Nausea and vomiting  03/31/2021   Osteomyelitis (HCC)    right third toe   Peripheral vascular disease (HCC)    Ulcer 08/14/20    Family History   Problem Relation Age of Onset   Breast cancer Mother    Colon cancer Mother    Heart Problems Mother    Hypertension Mother    Heart disease Mother    Rheum arthritis Mother    Hypertension Father    Heart disease Father    Multiple myeloma Father    Osteoarthritis Father    HIV/AIDS Brother    Hypertension Son    Hypertension Daughter    Lupus Cousin    Heart disease Cousin        has pacemaker   Past Surgical History:  Procedure Laterality Date   AMPUTATION Right 08/08/2017   Procedure: AMPUTATION RIGHT 3RD TOE;  Surgeon: Timothy Ford, MD;  Location: [facility] OR;  Service: Orthopedics;  Laterality: Right;   BIOPSY  08/14/2020   Procedure: BIOPSY;  Surgeon: Ozell Blunt, MD;  Location: [facility] ENDOSCOPY;  Service: Endoscopy;;   Bunionectomy Right 2006     Right   Bunionectony Left 2006      August   COLONOSCOPY N/A 01/21/2014   Procedure: COLONOSCOPY;  Surgeon: Almeda Aris, MD;  Location: [facility] ENDOSCOPY;  Service: Endoscopy;  Laterality: N/A;   ESOPHAGOGASTRODUODENOSCOPY (EGD) WITH PROPOFOL  N/A 08/14/2020   Procedure: ESOPHAGOGASTRODUODENOSCOPY (EGD) WITH PROPOFOL ;  Surgeon: Ozell Blunt, MD;  Location: [facility] ENDOSCOPY;  Service: Endoscopy;  Laterality: N/A;   IR THORACENTESIS ASP PLEURAL SPACE W/IMG GUIDE  05/03/2021   RIGHT/LEFT HEART CATH AND CORONARY ANGIOGRAPHY N/A 05/11/2021   Procedure: RIGHT/LEFT HEART CATH AND CORONARY ANGIOGRAPHY;  Surgeon: Millicent Ally, MD;  Location: [facility] INVASIVE CV LAB;  Service: Cardiovascular;  Laterality: N/A;   TOTAL HIP ARTHROPLASTY Left 05/11/2022   TUBAL LIGATION     WISDOM TOOTH EXTRACTION     Social History   Social History Narrative   Not on file   Immunization History  Administered Date(s) Administered   Fluad Trivalent(High Dose 65+) 04/07/2023   Influenza Split 07/11/2014   Influenza, High Dose Seasonal PF 03/23/2019   Influenza, Quadrivalent, Recombinant, Inj, Pf 03/30/2018   Influenza,inj,Quad PF,6+ Mos 03/23/2017    Influenza-Unspecified 03/30/2018, 04/29/2022   PFIZER(Purple Top)SARS-COV-2 Vaccination 09/16/2019, 10/07/2019   PNEUMOCOCCAL CONJUGATE-20 12/31/2022   Pneumococcal Polysaccharide-23 03/23/2019   Tdap 10/16/2015   Zoster Recombinant(Shingrix ) 05/28/2019, 08/03/2019     Objective: Vital Signs: There were no vitals taken for this visit.   Physical Exam   Musculoskeletal Exam: ***  CDAI Exam: CDAI Score: -- Patient Global: --; Provider Global: -- Swollen: --; Tender: -- Joint Exam 11/20/2023   No joint exam has been documented for this visit   There is currently no information documented on the homunculus. Go to the Rheumatology activity and complete the homunculus joint exam.  Investigation: No additional findings.  Imaging: No results found.  Recent Labs: Lab Results  Component Value Date   WBC 4.3 07/07/2023   HGB 14.3 07/07/2023   PLT 142 (L) 07/07/2023   NA 139 09/19/2023   K 4.5 09/19/2023   CL 104 09/19/2023   CO2 25 09/19/2023   GLUCOSE 78 09/19/2023   BUN 9 09/19/2023   CREATININE 0.74 09/19/2023   BILITOT 0.3 09/19/2023   ALKPHOS 190 (H) 07/07/2023   AST 48 (H) 09/19/2023   ALT 28 09/19/2023   PROT 6.4 09/19/2023   PROT 6.3 09/19/2023   ALBUMIN  3.3 (L) 07/07/2023  CALCIUM  8.5 (L) 09/19/2023   GFRAA 105 08/22/2020    Speciality Comments: No specialty comments available.  Procedures:  No procedures performed Allergies: Carvedilol    Assessment / Plan:     Visit Diagnoses: Age-related osteoporosis without current pathological fracture  Vitamin D  deficiency disease  Estrogen deficiency  Falls frequently  Coronary artery disease involving native coronary artery of native heart without angina pectoris  Ischemic cardiomyopathy  Acute deep vein thrombosis (DVT) of proximal vein of left lower extremity (HCC)  Essential hypertension, benign  Hypertensive heart disease with chronic systolic congestive heart failure (HCC)  Nonischemic  cardiomyopathy (HCC)  Renal infarct (HCC)  Gastroesophageal reflux disease without esophagitis  UGIB (upper gastrointestinal bleed)  Neuropathy  Weakness of left lower extremity  Thrombocytopenia (HCC)  Elevated LFTs  Iron overload  Spinal stenosis of lumbar region, unspecified whether neurogenic claudication present  Major depression, recurrent, chronic (HCC)  Primary insomnia  Prolonged QT interval  Orders: No orders of the defined types were placed in this encounter.  No orders of the defined types were placed in this encounter.   Face-to-face time spent with patient was *** minutes. Greater than 50% of time was spent in counseling and coordination of care.  Follow-Up Instructions: No follow-ups on file.   Romayne Clubs, PA-C  Note - This record has been created using Dragon software.  Chart creation errors have been sought, but may not always  have been located. Such creation errors do not reflect on  the standard of medical care.

## 2023-11-20 ENCOUNTER — Ambulatory Visit: Admitting: Physician Assistant

## 2023-11-20 DIAGNOSIS — I428 Other cardiomyopathies: Secondary | ICD-10-CM

## 2023-11-20 DIAGNOSIS — I255 Ischemic cardiomyopathy: Secondary | ICD-10-CM

## 2023-11-20 DIAGNOSIS — D696 Thrombocytopenia, unspecified: Secondary | ICD-10-CM

## 2023-11-20 DIAGNOSIS — F5101 Primary insomnia: Secondary | ICD-10-CM

## 2023-11-20 DIAGNOSIS — E559 Vitamin D deficiency, unspecified: Secondary | ICD-10-CM

## 2023-11-20 DIAGNOSIS — F339 Major depressive disorder, recurrent, unspecified: Secondary | ICD-10-CM

## 2023-11-20 DIAGNOSIS — R296 Repeated falls: Secondary | ICD-10-CM

## 2023-11-20 DIAGNOSIS — E2839 Other primary ovarian failure: Secondary | ICD-10-CM

## 2023-11-20 DIAGNOSIS — I10 Essential (primary) hypertension: Secondary | ICD-10-CM

## 2023-11-20 DIAGNOSIS — K219 Gastro-esophageal reflux disease without esophagitis: Secondary | ICD-10-CM

## 2023-11-20 DIAGNOSIS — I824Y2 Acute embolism and thrombosis of unspecified deep veins of left proximal lower extremity: Secondary | ICD-10-CM

## 2023-11-20 DIAGNOSIS — I5022 Chronic systolic (congestive) heart failure: Secondary | ICD-10-CM

## 2023-11-20 DIAGNOSIS — R9431 Abnormal electrocardiogram [ECG] [EKG]: Secondary | ICD-10-CM

## 2023-11-20 DIAGNOSIS — K922 Gastrointestinal hemorrhage, unspecified: Secondary | ICD-10-CM

## 2023-11-20 DIAGNOSIS — N280 Ischemia and infarction of kidney: Secondary | ICD-10-CM

## 2023-11-20 DIAGNOSIS — R7989 Other specified abnormal findings of blood chemistry: Secondary | ICD-10-CM

## 2023-11-20 DIAGNOSIS — I2510 Atherosclerotic heart disease of native coronary artery without angina pectoris: Secondary | ICD-10-CM

## 2023-11-20 DIAGNOSIS — M48061 Spinal stenosis, lumbar region without neurogenic claudication: Secondary | ICD-10-CM

## 2023-11-20 DIAGNOSIS — G629 Polyneuropathy, unspecified: Secondary | ICD-10-CM

## 2023-11-20 DIAGNOSIS — M810 Age-related osteoporosis without current pathological fracture: Secondary | ICD-10-CM

## 2023-11-20 DIAGNOSIS — R29898 Other symptoms and signs involving the musculoskeletal system: Secondary | ICD-10-CM

## 2023-12-24 DIAGNOSIS — R197 Diarrhea, unspecified: Secondary | ICD-10-CM | POA: Diagnosis not present

## 2023-12-24 DIAGNOSIS — I5020 Unspecified systolic (congestive) heart failure: Secondary | ICD-10-CM | POA: Diagnosis not present

## 2023-12-24 DIAGNOSIS — Z1211 Encounter for screening for malignant neoplasm of colon: Secondary | ICD-10-CM | POA: Diagnosis not present

## 2023-12-24 DIAGNOSIS — K625 Hemorrhage of anus and rectum: Secondary | ICD-10-CM | POA: Diagnosis not present

## 2023-12-25 ENCOUNTER — Telehealth: Payer: Self-pay

## 2023-12-25 NOTE — Telephone Encounter (Signed)
   Pre-operative Risk Assessment    Patient Name: Debbie Barry  DOB: 1951-05-28 MRN: [id number]   Date of last office visit: 10/07/23 with Josefa Beauvais, NP  Date of next office visit: None   Request for Surgical Clearance    Procedure:  colonoscopy   Date of Surgery:  Clearance 01/30/24                                 Surgeon:  Dr. Rollin Socks Group or Practice Name:  [facility] Phone number:  [phone]  Fax number:  [phone]   Type of Clearance Requested:   - Medical  - Pharmacy:  Hold Apixaban  (Eliquis ) not indicated   Type of Anesthesia:  Propofol    Additional requests/questions:    [name]   12/25/2023, 8:38 AM

## 2023-12-29 NOTE — Telephone Encounter (Signed)
 Left message to call back to schedule a tele pre op appt.  ?

## 2023-12-29 NOTE — Telephone Encounter (Signed)
 Patient was started on Eliquis  for DVT/renal infarct.  Should be managed by PCP.  Please reach out to them for clearance

## 2023-12-29 NOTE — Telephone Encounter (Signed)
   Name: Debbie Barry  DOB: Nov 27, 1950  MRN: [id number]  Primary Cardiologist: Darryle ONEIDA Decent, MD   Preoperative team, please contact this patient and set up a phone call appointment for further preoperative risk assessment. Please obtain consent and complete medication review. Thank you for your help.  I confirm that guidance regarding antiplatelet and oral anticoagulation therapy has been completed and, if necessary, noted below.  Eliquis  is managed by PCP for history of DVT/renal infarct.  I also confirmed the patient resides in the state of [place] . As per [facility] Medical Board telemedicine laws, the patient must reside in the state in which the provider is licensed.   Wyn Raddle, Jackee Shove, NP 12/29/2023, 1:10 PM [facility]

## 2023-12-30 NOTE — Telephone Encounter (Signed)
 Called and lvm for patient to call back so we could get her set up with a tele-visit.

## 2023-12-31 NOTE — Telephone Encounter (Signed)
 Called and lvm for patient to call back to schedule tele app with pre-op team.

## 2024-01-01 ENCOUNTER — Telehealth: Payer: Self-pay

## 2024-01-01 NOTE — Telephone Encounter (Signed)
 Pt returning call to schedule Pre Op Tele Visit. Pt states that she is currently on her lunch break at work and has to go back at 4:00 pm. If called back after 4 pm, please leave a  message with date and time of Tele visit. Pt states that the day does not matter just as long as the time is before 11:00 am if possible due to her having to be at work by then. Please advise

## 2024-01-01 NOTE — Telephone Encounter (Signed)
 Pt scheduled for a tele appt 01/19/24 with [name], NP. Med rec and consent done.      Patient Consent for Virtual Visit        [name] has provided verbal consent on 01/01/2024 for a virtual visit (video or telephone).   CONSENT FOR VIRTUAL VISIT FOR:  Debbie Barry  By participating in this virtual visit I agree to the following:  I hereby voluntarily request, consent and authorize [facility] and its employed or contracted physicians, physician assistants, nurse practitioners or other licensed health care professionals (the Practitioner), to provide me with telemedicine health care services (the "Services) as deemed necessary by the treating Practitioner. I acknowledge and consent to receive the Services by the Practitioner via telemedicine. I understand that the telemedicine visit will involve communicating with the Practitioner through live audiovisual communication technology and the disclosure of certain medical information by electronic transmission. I acknowledge that I have been given the opportunity to request an in-person assessment or other available alternative prior to the telemedicine visit and am voluntarily participating in the telemedicine visit.  I understand that I have the right to withhold or withdraw my consent to the use of telemedicine in the course of my care at any time, without affecting my right to future care or treatment, and that the Practitioner or I may terminate the telemedicine visit at any time. I understand that I have the right to inspect all information obtained and/or recorded in the course of the telemedicine visit and may receive copies of available information for a reasonable fee.  I understand that some of the potential risks of receiving the Services via telemedicine include:  Delay or interruption in medical evaluation due to technological equipment failure or disruption; Information transmitted may not be sufficient  (e.g. poor resolution of images) to allow for appropriate medical decision making by the Practitioner; and/or  In rare instances, security protocols could fail, causing a breach of personal health information.  Furthermore, I acknowledge that it is my responsibility to provide information about my medical history, conditions and care that is complete and accurate to the best of my ability. I acknowledge that Practitioner's advice, recommendations, and/or decision may be based on factors not within their control, such as incomplete or inaccurate data provided by me or distortions of diagnostic images or specimens that may result from electronic transmissions. I understand that the practice of medicine is not an exact science and that Practitioner makes no warranties or guarantees regarding treatment outcomes. I acknowledge that a copy of this consent can be made available to me via my patient portal [facility] MyChart), or I can request a printed copy by calling the office of [facility].    I understand that my insurance will be billed for this visit.   I have read or had this consent read to me. I understand the contents of this consent, which adequately explains the benefits and risks of the Services being provided via telemedicine.  I have been provided ample opportunity to ask questions regarding this consent and the Services and have had my questions answered to my satisfaction. I give my informed consent for the services to be provided through the use of telemedicine in my medical care

## 2024-01-01 NOTE — Telephone Encounter (Signed)
 I spoke with pt and got her scheduled for a tele appt 01/19/24 with [name], NP. Med rec and consent done.

## 2024-01-02 ENCOUNTER — Other Ambulatory Visit: Payer: Self-pay | Admitting: Cardiovascular Disease

## 2024-01-06 ENCOUNTER — Ambulatory Visit (INDEPENDENT_AMBULATORY_CARE_PROVIDER_SITE_OTHER): Payer: Self-pay | Admitting: Internal Medicine

## 2024-01-06 ENCOUNTER — Encounter: Payer: Self-pay | Admitting: Internal Medicine

## 2024-01-06 VITALS — BP 118/70 | HR 76 | Temp 98.3°F | Ht 60.0 in | Wt 181.6 lb

## 2024-01-06 DIAGNOSIS — F331 Major depressive disorder, recurrent, moderate: Secondary | ICD-10-CM | POA: Diagnosis not present

## 2024-01-06 DIAGNOSIS — I5022 Chronic systolic (congestive) heart failure: Secondary | ICD-10-CM | POA: Diagnosis not present

## 2024-01-06 DIAGNOSIS — Z6835 Body mass index (BMI) 35.0-35.9, adult: Secondary | ICD-10-CM

## 2024-01-06 DIAGNOSIS — R748 Abnormal levels of other serum enzymes: Secondary | ICD-10-CM

## 2024-01-06 DIAGNOSIS — I2510 Atherosclerotic heart disease of native coronary artery without angina pectoris: Secondary | ICD-10-CM

## 2024-01-06 DIAGNOSIS — E66812 Obesity, class 2: Secondary | ICD-10-CM

## 2024-01-06 DIAGNOSIS — I110 Hypertensive heart disease with heart failure: Secondary | ICD-10-CM

## 2024-01-06 DIAGNOSIS — I428 Other cardiomyopathies: Secondary | ICD-10-CM

## 2024-01-06 DIAGNOSIS — R197 Diarrhea, unspecified: Secondary | ICD-10-CM

## 2024-01-06 DIAGNOSIS — K219 Gastro-esophageal reflux disease without esophagitis: Secondary | ICD-10-CM

## 2024-01-06 DIAGNOSIS — M816 Localized osteoporosis [Lequesne]: Secondary | ICD-10-CM | POA: Diagnosis not present

## 2024-01-06 DIAGNOSIS — F5101 Primary insomnia: Secondary | ICD-10-CM

## 2024-01-06 DIAGNOSIS — Z0000 Encounter for general adult medical examination without abnormal findings: Secondary | ICD-10-CM

## 2024-01-06 DIAGNOSIS — D696 Thrombocytopenia, unspecified: Secondary | ICD-10-CM | POA: Diagnosis not present

## 2024-01-06 LAB — POCT URINALYSIS DIP (CLINITEK)
Bilirubin, UA: NEGATIVE
Blood, UA: NEGATIVE
Glucose, UA: NEGATIVE mg/dL
Ketones, POC UA: NEGATIVE mg/dL
Leukocytes, UA: NEGATIVE
Nitrite, UA: NEGATIVE
POC PROTEIN,UA: NEGATIVE
Spec Grav, UA: 1.02 (ref 1.010–1.025)
Urobilinogen, UA: 0.2 U/dL
pH, UA: 6 (ref 5.0–8.0)

## 2024-01-06 NOTE — Patient Instructions (Signed)

## 2024-01-06 NOTE — Progress Notes (Signed)
 I,[name], CMA,acting as a Neurosurgeon for [name], MD.,have documented all relevant documentation on the behalf of [name], MD,as directed by  [name], MD while in the presence of [name], MD.  Subjective:    Patient ID: Debbie Barry , female    DOB: [birth year] , 73 y.o.   MRN: [id number]  Chief Complaint  Patient presents with   Annual Exam    Patient presents today for annual exam. She reports compliance with medications. Denies headache, chest pain & sob. She is currently not est with a gyn. Colonoscopy sch for Aug 1st.  EKG completed on 10/07/23. With cardiologist.    Hypertension    HPI Discussed the use of AI scribe software for clinical note transcription with the patient, who gave verbal consent to proceed.  History of Present Illness Debbie Barry is a 73 year old female who presents with gastrointestinal symptoms and medication management.  She experiences frequent diarrhea, occurring five to six times a day, persisting for several days at a time. She takes over-the-counter Imodium  to manage the symptoms, but finds it expensive and insufficient. After eating, she often needs to use the bathroom immediately. She is scheduled for a colonoscopy on August 1st due to these symptoms.  She has a history of an ulcer and acid reflux, for which she occasionally takes Carafate . She is currently taking Protonix  once a day for acid reflux.  Her current medications include Eliquis  twice a day, Metoprolol  XL once a day, Potassium twice a day, Satin 20 mg, Entresto  twice a day, Sertraline  50 mg, and Spironolactone  half a tablet daily. She receives patient assistance for Entresto  and pays $45 foEliquis .  She reports no regular physical activity and acknowledges a weight gain of five pounds over the past three months, attributing it to increased eating. She does not consume alcohol regularly but occasionally drinks wine.  She has a history of a  kidney infarct, plaque in the aorta, and a blood clot in her leg, which necessitates the continued use of blood thinners.  She has been to a bone specialist but has not started treatment due to pending gum surgery and concerns about treatment costs.   Hypertension This is a chronic problem. The current episode started more than 1 year ago. Pertinent negatives include no blurred vision or chest pain. Risk factors for coronary artery disease include obesity, sedentary lifestyle and post-menopausal state. Past treatments include ACE inhibitors, calcium  channel blockers and diuretics. The current treatment provides mild improvement.     Past Medical History:  Diagnosis Date   Fatty liver    GERD (gastroesophageal reflux disease)    Hypertension    Nausea and vomiting 03/31/2021   Osteomyelitis (HCC)    right third toe   Peripheral vascular disease (HCC)    Ulcer 08/14/20     Family History  Problem Relation Age of Onset   Breast cancer Mother    Colon cancer Mother    Heart Problems Mother    Hypertension Mother    Heart disease Mother    Rheum arthritis Mother    Hypertension Father    Heart disease Father    Multiple myeloma Father    Osteoarthritis Father    HIV/AIDS Brother    Hypertension Son    Hypertension Daughter    Lupus Cousin    Heart disease Cousin        has pacemaker     Current Outpatient Medications:  apixaban  (ELIQUIS ) 5 MG TABS tablet, TAKE 1 TABLET(5 MG) BY MOUTH TWICE DAILY, Disp: 60 tablet, Rfl: 1   Cholecalciferol (VITAMIN D3) 5000 units CAPS, Take 5,000 Units by mouth daily., Disp: , Rfl:    Magnesium  500 MG TABS, Take 1 tablet by mouth daily., Disp: , Rfl:    metoprolol  succinate (TOPROL  XL) 25 MG 24 hr tablet, Take 1 tablet (25 mg total) by mouth daily., Disp: 90 tablet, Rfl: 1   Multiple Vitamin (MULTIVITAMIN WITH MINERALS) TABS tablet, Take 1 tablet by mouth daily., Disp: , Rfl:    OVER THE COUNTER MEDICATION, Take 1 capsule by mouth daily.  Patient takes a circulation pill from [place], last took on Thursday., Disp: , Rfl:    pantoprazole  (PROTONIX ) 40 MG tablet, Take 1 tablet (40 mg total) by mouth daily., Disp: 90 tablet, Rfl: 2   potassium chloride  SA (KLOR-CON  M) 20 MEQ tablet, TAKE 1 TABLET(20 MEQ) BY MOUTH TWICE DAILY, Disp: 60 tablet, Rfl: 2   rosuvastatin  (CRESTOR ) 20 MG tablet, Take 1 tablet (20 mg total) by mouth daily., Disp: 90 tablet, Rfl: 3   sacubitril -valsartan  (ENTRESTO ) 24-26 MG, TAKE 1 TABLET BY MOUTH TWICE DAILY, Disp: 180 tablet, Rfl: 3   sertraline  (ZOLOFT ) 50 MG tablet, Take 1 tablet (50 mg total) by mouth daily., Disp: 90 tablet, Rfl: 2   spironolactone  (ALDACTONE ) 25 MG tablet, TAKE 1/2 TABLET(12.5 MG) BY MOUTH DAILY, Disp: 45 tablet, Rfl: 1   sucralfate  (CARAFATE ) 1 g tablet, TAKE 1 TABLET(1 GRAM) BY MOUTH TWICE DAILY prn, Disp: 60 tablet, Rfl: 3   tetrahydrozoline 0.05 % ophthalmic solution, Place 1 drop into both eyes daily., Disp: , Rfl:    torsemide  (DEMADEX ) 20 MG tablet, Take 1 tablet (20 mg total) by mouth daily as needed (swelling)., Disp: 20 tablet, Rfl: 1   Allergies  Allergen Reactions   Carvedilol  Diarrhea      The patient states she uses post menopausal status for birth control. No LMP recorded. Patient is postmenopausal.. Negative for Dysmenorrhea and Positive for Dysmenorrhea. Negative for: breast discharge, breast lump(s), breast pain and breast self exam. Associated symptoms include abnormal vaginal bleeding. Pertinent negatives include abnormal bleeding (hematology), anxiety, decreased libido, depression, difficulty falling sleep, dyspareunia, history of infertility, nocturia, sexual dysfunction, sleep disturbances, urinary incontinence, urinary urgency, vaginal discharge and vaginal itching. Diet regular.The patient states her exercise level is  intermittent.  . The patient's tobacco use is:  Social History   Tobacco Use  Smoking Status Never   Passive exposure: Current  Smokeless  Tobacco Never  . She has been exposed to passive smoke. The patient's alcohol use is:  Social History   Substance and Sexual Activity  Alcohol Use Yes   Comment: social glass of wine    Review of Systems  Constitutional: Negative.   HENT: Negative.    Eyes: Negative.  Negative for blurred vision.  Respiratory: Negative.    Cardiovascular: Negative.  Negative for chest pain.  Gastrointestinal:  Positive for diarrhea.       POS heartburn  Endocrine: Negative.   Genitourinary: Negative.   Musculoskeletal: Negative.   Skin: Negative.   Allergic/Immunologic: Negative.   Neurological: Negative.   Hematological: Negative.   Psychiatric/Behavioral: Negative.       Today's Vitals   01/06/24 1519  BP: 118/70  Pulse: 76  Temp: 98.3 F (36.8 C)  SpO2: 98%  Weight: 181 lb 9.6 oz (82.4 kg)  Height: 5' (1.524 m)   Body mass index is 35.47 kg/m.  Wt Readings from Last 3 Encounters:  01/06/24 181 lb 9.6 oz (82.4 kg)  10/07/23 176 lb (79.8 kg)  09/19/23 177 lb 3.2 oz (80.4 kg)     Objective:  Physical Exam Vitals and nursing note reviewed.  Constitutional:      Appearance: Normal appearance. She is obese.  HENT:     Head: Normocephalic and atraumatic.     Right Ear: Tympanic membrane, ear canal and external ear normal.     Left Ear: Tympanic membrane, ear canal and external ear normal.     Nose: Nose normal.     Mouth/Throat:     Mouth: Mucous membranes are moist.     Pharynx: Oropharynx is clear.  Eyes:     Extraocular Movements: Extraocular movements intact.     Conjunctiva/sclera: Conjunctivae normal.     Pupils: Pupils are equal, round, and reactive to light.  Cardiovascular:     Rate and Rhythm: Normal rate and regular rhythm.     Pulses: Normal pulses.     Heart sounds: Normal heart sounds.  Pulmonary:     Effort: Pulmonary effort is normal.     Breath sounds: Normal breath sounds.  Chest:  Breasts:    Tanner Score is 5.     Right: Normal.     Left:  Normal.  Abdominal:     General: Bowel sounds are normal.     Palpations: Abdomen is soft.     Comments: Obese, soft.   Genitourinary:    Comments: deferred Musculoskeletal:        General: Normal range of motion.     Cervical back: Normal range of motion and neck supple.  Skin:    General: Skin is warm and dry.  Neurological:     General: No focal deficit present.     Mental Status: She is alert and oriented to person, place, and time.  Psychiatric:        Mood and Affect: Mood normal.        Behavior: Behavior normal.      Assessment And Plan:     Encounter for annual health examination Assessment & Plan: A full exam was performed. Pelvic exam declined, plan to perform at next  physical. She is encouraged to perform monthly self breast exams. PATIENT IS ADVISED TO GET 30-45 MINUTES REGULAR EXERCISE NO LESS THAN FOUR TO FIVE DAYS PER WEEK - BOTH WEIGHTBEARING EXERCISES AND AEROBIC ARE RECOMMENDED.  PATIENT IS ADVISED TO FOLLOW A HEALTHY DIET WITH AT LEAST SIX FRUITS/VEGGIES PER DAY, DECREASE INTAKE OF RED MEAT, AND TO INCREASE FISH INTAKE TO TWO DAYS PER WEEK.  MEATS/FISH SHOULD NOT BE FRIED, BAKED OR BROILED IS PREFERABLE.  IT IS ALSO IMPORTANT TO CUT BACK ON YOUR SUGAR INTAKE. PLEASE AVOID ANYTHING WITH ADDED SUGAR, CORN SYRUP OR OTHER SWEETENERS. IF YOU MUST USE A SWEETENER, YOU CAN TRY STEVIA. IT IS ALSO IMPORTANT TO AVOID ARTIFICIALLY SWEETENERS AND DIET BEVERAGES. LASTLY, I SUGGEST WEARING SPF 50 SUNSCREEN ON EXPOSED PARTS AND ESPECIALLY WHEN IN THE DIRECT SUNLIGHT FOR AN EXTENDED PERIOD OF TIME.  PLEASE AVOID FAST FOOD RESTAURANTS AND INCREASE YOUR WATER  INTAKE.    Hypertensive heart disease with chronic systolic congestive heart failure (HCC) [I11.0, I50.22] Assessment & Plan: Chronic, fair control.  April 2025 EKG results reviewed.   She is now stable on Entresto  24/26mg  twice daily, spironolactone  25mg  1/2 ab daily, Toprol  XL 25mg  and demadex . She is reminded to follow a low  sodium diet and to keep f/u  Cardiology appointments.   Orders: -     Lipid panel -     POCT URINALYSIS DIP (CLINITEK) -     Microalbumin / creatinine urine ratio -     BMP8+EGFR  Coronary artery disease involving native coronary artery of native heart without angina pectoris [I25.10] Assessment & Plan: Chronic, LDL goal is less than 70.  She will continue with rosuvastatin  20mg  daily.   - Encouraged to follow heart healthy lifestyle - Increase daily activity, aiming for 150 minutes per week   Diarrhea, unspecified type Assessment & Plan: Chronic diarrhea with significant potassium loss causing muscle spasms. - Advise potassium-rich foods on severe diarrhea days. - Continue Imodium  as needed.   Localized osteoporosis without current pathological fracture Assessment & Plan: Treatment pending due to dental surgery and cost concerns. Considering Prolia. Museum/gallery conservator for Prolia cost. - Discuss importance of osteoporosis treatment.   Moderate episode of recurrent major depressive disorder (HCC) [F33.1] Assessment & Plan: Chronic, sx stable on sertraline .    Gastroesophageal reflux disease without esophagitis Assessment & Plan: GERD managed with Protonix . Carafate  unnecessary unless symptoms worsen. - Continue Protonix  once daily. - Discontinue Carafate  unless symptoms worsen.   Elevated liver enzymes -     FIB-4  Thrombocytopenia (HCC) -     CBC  Class 2 severe obesity due to excess calories with serious comorbidity and body mass index (BMI) of 35.0 to 35.9 in adult [facility]) Assessment & Plan: She is encouraged to strive for BMI less than 30 to decrease cardiac risk. Advised to aim for at least 150 minutes of exercise per week.    Return for 1 year HM, 6 month bpc. Patient was given opportunity to ask questions. Patient verbalized understanding of the plan and was able to repeat key elements of the plan. All questions were answered to their satisfaction.   I,  [name], MD, have reviewed all documentation for this visit. The documentation on 01/06/24 for the exam, diagnosis, procedures, and orders are all accurate and complete.

## 2024-01-07 ENCOUNTER — Ambulatory Visit: Payer: Self-pay | Admitting: Internal Medicine

## 2024-01-07 ENCOUNTER — Other Ambulatory Visit: Payer: Self-pay | Admitting: Internal Medicine

## 2024-01-07 DIAGNOSIS — K7400 Hepatic fibrosis, unspecified: Secondary | ICD-10-CM

## 2024-01-07 LAB — CBC
Hematocrit: 41.1 % (ref 34.0–46.6)
Hemoglobin: 13.5 g/dL (ref 11.1–15.9)
MCH: 31.8 pg (ref 26.6–33.0)
MCHC: 32.8 g/dL (ref 31.5–35.7)
MCV: 97 fL (ref 79–97)
Platelets: 166 x10E3/uL (ref 150–450)
RBC: 4.25 x10E6/uL (ref 3.77–5.28)
RDW: 12.4 % (ref 11.7–15.4)
WBC: 4.5 x10E3/uL (ref 3.4–10.8)

## 2024-01-07 LAB — LIPID PANEL
Chol/HDL Ratio: 2.2 ratio (ref 0.0–4.4)
Cholesterol, Total: 118 mg/dL (ref 100–199)
HDL: 54 mg/dL (ref >39)
LDL Chol Calc (NIH): 38 mg/dL (ref 0–99)
Triglycerides: 159 mg/dL — ABNORMAL HIGH (ref 0–149)
VLDL Cholesterol Cal: 26 mg/dL (ref 5–40)

## 2024-01-07 LAB — BMP8+EGFR
BUN/Creatinine Ratio: 8 — ABNORMAL LOW (ref 12–28)
BUN: 7 mg/dL — ABNORMAL LOW (ref 8–27)
CO2: 22 mmol/L (ref 20–29)
Calcium: 8.8 mg/dL (ref 8.7–10.3)
Chloride: 102 mmol/L (ref 96–106)
Creatinine, Ser: 0.84 mg/dL (ref 0.57–1.00)
Glucose: 91 mg/dL (ref 70–99)
Potassium: 4.3 mmol/L (ref 3.5–5.2)
Sodium: 140 mmol/L (ref 134–144)
eGFR: 75 mL/min/1.73 (ref >59)

## 2024-01-07 LAB — FIB-4
ALT: 20 IU/L (ref 0–32)
AST: 41 IU/L — ABNORMAL HIGH (ref 0–40)
FIB-4 Index: 5.09 — ABNORMAL HIGH (ref 0.00–2.67)
Platelets: 126 x10E3/uL — ABNORMAL LOW (ref 150–450)

## 2024-01-07 LAB — MICROALBUMIN / CREATININE URINE RATIO
Creatinine, Urine: 181.6 mg/dL
Microalb/Creat Ratio: 3 mg/g{creat} (ref 0–29)
Microalbumin, Urine: 5.1 ug/mL

## 2024-01-11 DIAGNOSIS — R748 Abnormal levels of other serum enzymes: Secondary | ICD-10-CM | POA: Insufficient documentation

## 2024-01-11 DIAGNOSIS — M816 Localized osteoporosis [Lequesne]: Secondary | ICD-10-CM | POA: Insufficient documentation

## 2024-01-11 DIAGNOSIS — R197 Diarrhea, unspecified: Secondary | ICD-10-CM | POA: Insufficient documentation

## 2024-01-11 NOTE — Assessment & Plan Note (Signed)
 A full exam was performed. Pelvic exam declined, plan to perform at next  physical. She is encouraged to perform monthly self breast exams. PATIENT IS ADVISED TO GET 30-45 MINUTES REGULAR EXERCISE NO LESS THAN FOUR TO FIVE DAYS PER WEEK - BOTH WEIGHTBEARING EXERCISES AND AEROBIC ARE RECOMMENDED.  PATIENT IS ADVISED TO FOLLOW A HEALTHY DIET WITH AT LEAST SIX FRUITS/VEGGIES PER DAY, DECREASE INTAKE OF RED MEAT, AND TO INCREASE FISH INTAKE TO TWO DAYS PER WEEK.  MEATS/FISH SHOULD NOT BE FRIED, BAKED OR BROILED IS PREFERABLE.  IT IS ALSO IMPORTANT TO CUT BACK ON YOUR SUGAR INTAKE. PLEASE AVOID ANYTHING WITH ADDED SUGAR, CORN SYRUP OR OTHER SWEETENERS. IF YOU MUST USE A SWEETENER, YOU CAN TRY STEVIA. IT IS ALSO IMPORTANT TO AVOID ARTIFICIALLY SWEETENERS AND DIET BEVERAGES. LASTLY, I SUGGEST WEARING SPF 50 SUNSCREEN ON EXPOSED PARTS AND ESPECIALLY WHEN IN THE DIRECT SUNLIGHT FOR AN EXTENDED PERIOD OF TIME.  PLEASE AVOID FAST FOOD RESTAURANTS AND INCREASE YOUR WATER  INTAKE.

## 2024-01-11 NOTE — Assessment & Plan Note (Signed)
 Chronic, fair control.  April 2025 EKG results reviewed.   She is now stable on Entresto  24/26mg  twice daily, spironolactone  25mg  1/2 ab daily, Toprol  XL 25mg  and demadex . She is reminded to follow a low sodium diet and to keep f/u Cardiology appointments.

## 2024-01-11 NOTE — Assessment & Plan Note (Signed)
 GERD managed with Protonix . Carafate  unnecessary unless symptoms worsen. - Continue Protonix  once daily. - Discontinue Carafate  unless symptoms worsen.

## 2024-01-11 NOTE — Assessment & Plan Note (Signed)
 Treatment pending due to dental surgery and cost concerns. Considering Prolia. Museum/gallery conservator for Prolia cost. - Discuss importance of osteoporosis treatment.

## 2024-01-11 NOTE — Assessment & Plan Note (Signed)
 Chronic, sx stable on sertraline .

## 2024-01-11 NOTE — Assessment & Plan Note (Signed)
 Chronic, LDL goal is less than 70.  She will continue with rosuvastatin  20mg  daily.   - Encouraged to follow heart healthy lifestyle - Increase daily activity, aiming for 150 minutes per week

## 2024-01-11 NOTE — Assessment & Plan Note (Signed)
 She is encouraged to strive for BMI less than 30 to decrease cardiac risk. Advised to aim for at least 150 minutes of exercise per week.

## 2024-01-11 NOTE — Assessment & Plan Note (Signed)
 Chronic diarrhea with significant potassium loss causing muscle spasms. - Advise potassium-rich foods on severe diarrhea days. - Continue Imodium  as needed.

## 2024-01-12 ENCOUNTER — Other Ambulatory Visit: Payer: Self-pay | Admitting: Gastroenterology

## 2024-01-19 ENCOUNTER — Ambulatory Visit: Attending: Internal Medicine | Admitting: Nurse Practitioner

## 2024-01-19 NOTE — Progress Notes (Signed)
 Virtual Visit via Telephone Note   Because of Debbie Barry co-morbid illnesses, she is at least at moderate risk for complications without adequate follow up.  This format is felt to be most appropriate for this patient at this time.  Due to technical limitations with video connection Web designer), today's appointment will be conducted as an audio only telehealth visit, and Debbie Barry verbally agreed to proceed in this manner.   All issues noted in this document were discussed and addressed.  No physical exam could be performed with this format.  Evaluation Performed:  Preoperative cardiovascular risk assessment _____________   Date:  02/12/2024   Patient ID:  Debbie Barry, DOB 01-21-1951, MRN [id number] Patient Location:  Home Provider location:   Office  Primary Care Provider:  Jarold Medici, MD Primary Cardiologist:  Darryle ONEIDA Decent, MD  Chief Complaint / Patient Profile   73 y.o. y/o female with a h/o nonobstructive CAD, CHF, DVT, fatty liver, severe spinal cord stenosis, right renal infarct, torsades/cardiac arrest/polymorphic VT episode 05/2021 in the setting of electrolyte derangement, EF was 25-30% with recovery to 55-60% in 2023 who is pending colonoscopy with Dr. Rollin on 01/30/24 and presents today for telephonic preoperative cardiovascular risk assessment.  History of Present Illness    Debbie Barry is a 73 y.o. female who presents via audio/video conferencing for a telehealth visit today.  Pt was last seen in cardiology clinic on 10/07/23 by Debbie Beauvais, NP.  At that time Debbie Barry was doing well.  The patient is now pending procedure as outlined above. Since her last visit, she denies chest pain, shortness of breath, lower extremity edema, fatigue, palpitations, melena, hematuria, hemoptysis, diaphoresis, weakness, presyncope, syncope, orthopnea, and PND.   Past Medical History    Past Medical History:  Diagnosis Date   Fatty liver    GERD  (gastroesophageal reflux disease)    Hypertension    Nausea and vomiting 03/31/2021   Osteomyelitis (HCC)    right third toe   Peripheral vascular disease (HCC)    Ulcer 08/14/20   Past Surgical History:  Procedure Laterality Date   AMPUTATION Right 08/08/2017   Procedure: AMPUTATION RIGHT 3RD TOE;  Surgeon: Harden Jerona GAILS, MD;  Location: [facility] OR;  Service: Orthopedics;  Laterality: Right;   BIOPSY  08/14/2020   Procedure: BIOPSY;  Surgeon: Rosalie Kitchens, MD;  Location: [facility];  Service: Endoscopy;;   Bunionectomy Right 2006     Right   Bunionectony Left 2006      August   COLONOSCOPY N/A 01/21/2014   Procedure: COLONOSCOPY;  Surgeon: Belvie JONETTA Rollin, MD;  Location: [facility];  Service: Endoscopy;  Laterality: N/A;   COLONOSCOPY N/A 01/30/2024   Procedure: COLONOSCOPY;  Surgeon: Rollin Belvie, MD;  Location: [facility];  Service: Gastroenterology;  Laterality: N/A;   ESOPHAGOGASTRODUODENOSCOPY (EGD) WITH PROPOFOL  N/A 08/14/2020   Procedure: ESOPHAGOGASTRODUODENOSCOPY (EGD) WITH PROPOFOL ;  Surgeon: Rosalie Kitchens, MD;  Location: [facility];  Service: Endoscopy;  Laterality: N/A;   IR THORACENTESIS ASP PLEURAL SPACE W/IMG GUIDE  05/03/2021   RIGHT/LEFT HEART CATH AND CORONARY ANGIOGRAPHY N/A 05/11/2021   Procedure: RIGHT/LEFT HEART CATH AND CORONARY ANGIOGRAPHY;  Surgeon: Burnard Debby LABOR, MD;  Location: [facility];  Service: Cardiovascular;  Laterality: N/A;   TOTAL HIP ARTHROPLASTY Left 05/11/2022   TUBAL LIGATION     WISDOM TOOTH EXTRACTION      Allergies  Allergies  Allergen Reactions   Carvedilol  Diarrhea    Home Medications  Prior to Admission medications   Medication Sig Start Date End Date Taking? Authorizing Provider  apixaban  (ELIQUIS ) 5 MG TABS tablet TAKE 1 TABLET(5 MG) BY MOUTH TWICE DAILY 02/27/23   O'Neal, Darryle Ned, MD  Cholecalciferol (VITAMIN D3) 5000 units CAPS Take 5,000 Units by mouth daily.    [provider]  Magnesium  500 MG  TABS Take 1 tablet by mouth daily.    [provider]  metoprolol  succinate (TOPROL  XL) 25 MG 24 hr tablet Take 1 tablet (25 mg total) by mouth daily. 10/07/23   Emelia Debbie HERO, NP  Multiple Vitamin (MULTIVITAMIN WITH MINERALS) TABS tablet Take 1 tablet by mouth daily.    [provider]  OVER THE COUNTER MEDICATION Take 1 capsule by mouth daily. Patient takes a circulation pill from [place], last took on Thursday.    [provider]  pantoprazole  (PROTONIX ) 40 MG tablet Take 1 tablet (40 mg total) by mouth daily. 06/19/23   Jarold Medici, MD  potassium chloride  SA (KLOR-CON  M) 20 MEQ tablet TAKE 1 TABLET(20 MEQ) BY MOUTH TWICE DAILY 09/20/22   O'Neal, Darryle Ned, MD  rosuvastatin  (CRESTOR ) 20 MG tablet Take 1 tablet (20 mg total) by mouth daily. 06/26/23   Jarold Medici, MD  sacubitril -valsartan  (ENTRESTO ) 24-26 MG TAKE 1 TABLET BY MOUTH TWICE DAILY 01/05/24   O'Neal, Darryle Ned, MD  sertraline  (ZOLOFT ) 50 MG tablet Take 1 tablet (50 mg total) by mouth daily. 02/26/23 02/26/24  Jarold Medici, MD  spironolactone  (ALDACTONE ) 25 MG tablet TAKE 1/2 TABLET(12.5 MG) BY MOUTH DAILY 09/29/23   O'Neal, Darryle Ned, MD  sucralfate  (CARAFATE ) 1 g tablet TAKE 1 TABLET(1 GRAM) BY MOUTH TWICE DAILY prn 12/31/22   Jarold Medici, MD  tetrahydrozoline 0.05 % ophthalmic solution Place 1 drop into both eyes daily.    [provider]  torsemide  (DEMADEX ) 20 MG tablet Take 1 tablet (20 mg total) by mouth daily as needed (swelling). 03/10/23 01/06/24  O'NealDarryle Ned, MD    Physical Exam    Vital Signs:  Debbie Barry does not have vital signs available for review today.  Given telephonic nature of communication, physical exam is limited. AAOx3. NAD. Normal affect.  Speech and respirations are unlabored.  Accessory Clinical Findings    None  Assessment & Plan    1.  Preoperative Cardiovascular Risk Assessment:  The patient was advised that if she develops new  symptoms prior to surgery to contact our office to arrange for a follow-up visit, and she verbalized understanding.  Eliquis  is managed by PCP for history of DVT/renal infarct.   A copy of this note will be routed to requesting surgeon.  Time:   Today, I have spent  minutes with the patient with telehealth technology discussing medical history, symptoms, and management plan.     Andera Cranmer, Rosaline HERO, NP  02/12/2024, 10:20 AM This encounter was created in error - please disregard.

## 2024-01-21 NOTE — Telephone Encounter (Signed)
 Patient had TELEVISIT scheduled for 7/21 to get clearance for procedure scheduled on 8/1 patient has been rescheduled for this upcoming Friday 7/25 and consent is done

## 2024-01-23 ENCOUNTER — Ambulatory Visit: Attending: Internal Medicine

## 2024-01-23 ENCOUNTER — Encounter (HOSPITAL_COMMUNITY): Payer: Self-pay | Admitting: Gastroenterology

## 2024-01-23 DIAGNOSIS — Z01810 Encounter for preprocedural cardiovascular examination: Secondary | ICD-10-CM

## 2024-01-23 NOTE — Progress Notes (Signed)
 Attempted to obtain medical history for pre op call via telephone, unable to reach at this time. HIPAA compliant voicemail message left requesting return call to pre surgical testing department.

## 2024-01-23 NOTE — Progress Notes (Signed)
 Virtual Visit via Telephone Note   Because of Debbie Barry co-morbid illnesses, she is at least at moderate risk for complications without adequate follow up.  This format is felt to be most appropriate for this patient at this time.  Due to technical limitations with video connection Web designer), today's appointment will be conducted as an audio only telehealth visit, and Debbie Barry verbally agreed to proceed in this manner.   All issues noted in this document were discussed and addressed.  No physical exam could be performed with this format.  Evaluation Performed:  Preoperative cardiovascular risk assessment _____________   Date:  01/23/2024   Patient ID:  Debbie Barry, DOB 03/01/1951, MRN [id number] Patient Location:  Home Provider location:   Office  Primary Care Provider:  Jarold Medici, MD Primary Cardiologist:  Darryle ONEIDA Decent, MD  Chief Complaint / Patient Profile   73 y.o. y/o female with a h/o chronic systolic CHF and hypertension who is pending colonoscopy and presents today for telephonic preoperative cardiovascular risk assessment.  History of Present Illness    Debbie Barry is a 73 y.o. female who presents via audio/video conferencing for a telehealth visit today.  Pt was last seen in cardiology clinic on/8/25 by Josefa Beauvais, NP.  At that time Debbie Barry was doing well with blood pressure under control.  The patient is now pending procedure as outlined above. Since her last visit, she tells me she has been feeling good.  No issues with her heart.  She does state that she works every day from home and gets tired easily.  This is nothing new for her.  She gets short of breath when walking and going up stairs.  Echocardiogram from last year reviewed with normal LVEF and grade 1 DD.  Dr. Decent recommended to increase physical activity.  Eliquis  is managed by PCP for history of DVT/renal infarct.   Past Medical History    Past Medical History:  Diagnosis  Date   Fatty liver    GERD (gastroesophageal reflux disease)    Hypertension    Nausea and vomiting 03/31/2021   Osteomyelitis (HCC)    right third toe   Peripheral vascular disease (HCC)    Ulcer 08/14/20   Past Surgical History:  Procedure Laterality Date   AMPUTATION Right 08/08/2017   Procedure: AMPUTATION RIGHT 3RD TOE;  Surgeon: Harden Jerona GAILS, MD;  Location: [facility] OR;  Service: Orthopedics;  Laterality: Right;   BIOPSY  08/14/2020   Procedure: BIOPSY;  Surgeon: Rosalie Kitchens, MD;  Location: [facility];  Service: Endoscopy;;   Bunionectomy Right 2006     Right   Bunionectony Left 2006      August   COLONOSCOPY N/A 01/21/2014   Procedure: COLONOSCOPY;  Surgeon: Belvie JONETTA Just, MD;  Location: [facility];  Service: Endoscopy;  Laterality: N/A;   ESOPHAGOGASTRODUODENOSCOPY (EGD) WITH PROPOFOL  N/A 08/14/2020   Procedure: ESOPHAGOGASTRODUODENOSCOPY (EGD) WITH PROPOFOL ;  Surgeon: Rosalie Kitchens, MD;  Location: [facility];  Service: Endoscopy;  Laterality: N/A;   IR THORACENTESIS ASP PLEURAL SPACE W/IMG GUIDE  05/03/2021   RIGHT/LEFT HEART CATH AND CORONARY ANGIOGRAPHY N/A 05/11/2021   Procedure: RIGHT/LEFT HEART CATH AND CORONARY ANGIOGRAPHY;  Surgeon: Burnard Debby LABOR, MD;  Location: [facility];  Service: Cardiovascular;  Laterality: N/A;   TOTAL HIP ARTHROPLASTY Left 05/11/2022   TUBAL LIGATION     WISDOM TOOTH EXTRACTION      Allergies  Allergies  Allergen Reactions   Carvedilol  Diarrhea  Home Medications    Prior to Admission medications   Medication Sig Start Date End Date Taking? Authorizing Provider  apixaban  (ELIQUIS ) 5 MG TABS tablet TAKE 1 TABLET(5 MG) BY MOUTH TWICE DAILY 02/27/23   O'Neal, Darryle Ned, MD  Cholecalciferol (VITAMIN D3) 5000 units CAPS Take 5,000 Units by mouth daily.    [provider]  Magnesium  500 MG TABS Take 1 tablet by mouth daily.    [provider]  metoprolol  succinate (TOPROL  XL) 25 MG 24 hr tablet Take 1  tablet (25 mg total) by mouth daily. 10/07/23   Emelia Josefa HERO, NP  Multiple Vitamin (MULTIVITAMIN WITH MINERALS) TABS tablet Take 1 tablet by mouth daily.    [provider]  OVER THE COUNTER MEDICATION Take 1 capsule by mouth daily. Patient takes a circulation pill from [place], last took on Thursday.    [provider]  pantoprazole  (PROTONIX ) 40 MG tablet Take 1 tablet (40 mg total) by mouth daily. 06/19/23   Jarold Medici, MD  potassium chloride  SA (KLOR-CON  M) 20 MEQ tablet TAKE 1 TABLET(20 MEQ) BY MOUTH TWICE DAILY 09/20/22   O'Neal, Darryle Ned, MD  rosuvastatin  (CRESTOR ) 20 MG tablet Take 1 tablet (20 mg total) by mouth daily. 06/26/23   Jarold Medici, MD  sacubitril -valsartan  (ENTRESTO ) 24-26 MG TAKE 1 TABLET BY MOUTH TWICE DAILY 01/05/24   O'Neal, Darryle Ned, MD  sertraline  (ZOLOFT ) 50 MG tablet Take 1 tablet (50 mg total) by mouth daily. 02/26/23 02/26/24  Jarold Medici, MD  spironolactone  (ALDACTONE ) 25 MG tablet TAKE 1/2 TABLET(12.5 MG) BY MOUTH DAILY 09/29/23   O'Neal, Darryle Ned, MD  sucralfate  (CARAFATE ) 1 g tablet TAKE 1 TABLET(1 GRAM) BY MOUTH TWICE DAILY prn 12/31/22   Jarold Medici, MD  tetrahydrozoline 0.05 % ophthalmic solution Place 1 drop into both eyes daily.    [provider]  torsemide  (DEMADEX ) 20 MG tablet Take 1 tablet (20 mg total) by mouth daily as needed (swelling). 03/10/23 01/06/24  O'NealDarryle Ned, MD    Physical Exam    Vital Signs:  Debbie Barry does not have vital signs available for review today.  122/70  Given telephonic nature of communication, physical exam is limited. AAOx3. NAD. Normal affect.  Speech and respirations are unlabored.  Accessory Clinical Findings    None  Assessment & Plan    1.  Preoperative Cardiovascular Risk Assessment:   [name] perioperative risk of a major cardiac event is 0.4% according to the Revised Cardiac Risk Index (RCRI).  Therefore, she is at low risk for perioperative  complications.   Her functional capacity is fair at 4.06 METs according to the Duke Activity Status Index (DASI). Recommendations: According to ACC/AHA guidelines, no further cardiovascular testing needed.  The patient may proceed to surgery at acceptable risk.     The patient was advised that if she develops new symptoms prior to surgery to contact our office to arrange for a follow-up visit, and she verbalized understanding.   A copy of this note will be routed to requesting surgeon.  Time:   Today, I have spent 10 minutes with the patient with telehealth technology discussing medical history, symptoms, and management plan.     Orren LOISE Fabry, PA-C  01/23/2024, 10:57 AM

## 2024-01-25 ENCOUNTER — Other Ambulatory Visit: Payer: Self-pay | Admitting: Medical Genetics

## 2024-01-29 NOTE — Anesthesia Preprocedure Evaluation (Signed)
 Anesthesia Evaluation   Patient awake    Reviewed: Allergy & Precautions, NPO status , Patient's Chart, lab work & pertinent test results, reviewed documented beta blocker date and time   History of Anesthesia Complications Negative for: history of anesthetic complications  Airway Mallampati: II  TM Distance: >3 FB Neck ROM: Full    Dental  (+) Edentulous Upper, Edentulous Lower   Pulmonary neg pulmonary ROS   Pulmonary exam normal breath sounds clear to auscultation       Cardiovascular hypertension, Pt. on home beta blockers and Pt. on medications + CAD, + Peripheral Vascular Disease, +CHF and + DVT  Normal cardiovascular exam Rhythm:Regular Rate:Normal  TTE 2024 1. Left ventricular ejection fraction, by estimation, is 55 to 60%. Left  ventricular ejection fraction by 3D volume is 55 %. The left ventricle has  normal function. The left ventricle has no regional wall motion  abnormalities. Left ventricular diastolic   parameters are consistent with Grade I diastolic dysfunction (impaired  relaxation).   2. Right ventricular systolic function is normal. The right ventricular  size is normal.   3. The mitral valve is normal in structure. Mild mitral valve  regurgitation.   4. The aortic valve is tricuspid. Aortic valve regurgitation is not  visualized. Aortic valve sclerosis/calcification is present, without any  evidence of aortic stenosis.   5. The inferior vena cava is normal in size with greater than 50%  respiratory variability, suggesting right atrial pressure of 3 mmHg.   Cath 2022  Prox LAD lesion is 20% stenosed.   Ost LAD to Prox LAD lesion is 20% stenosed.  No  obstructive CAD with minimal coronary calcification at the ostium and proximal LAD with 20% narrowing.  Otherwise normal coronary arteries  Normal right heart pressures Findings compatible with a nonischemic cardiomyopathy. Significant calcification in  the femoral arteries right greater than left with significant luminal calcification in the right femoral artery. RECOMMENDATION: Guideline directed medical therapy for severe LV dysfunction.  Avoidance of QT prolongation medications.  Patient has significant claudication symptomatology    Neuro/Psych  PSYCHIATRIC DISORDERS  Depression    negative neurological ROS     GI/Hepatic Neg liver ROS,GERD  Medicated,,  Endo/Other  negative endocrine ROS    Renal/GU negative Renal ROS  negative genitourinary   Musculoskeletal negative musculoskeletal ROS (+)    Abdominal   Peds  Hematology  (+) Blood dyscrasia (eliquis )   Anesthesia Other Findings   Reproductive/Obstetrics                              Anesthesia Physical Anesthesia Plan  ASA: 3  Anesthesia Plan: MAC   Post-op Pain Management:    Induction: Intravenous  PONV Risk Score and Plan: 2 and Propofol  infusion and Treatment may vary due to age or medical condition  Airway Management Planned: Natural Airway  Additional Equipment:   Intra-op Plan:   Post-operative Plan:   Informed Consent: I have reviewed the patients History and Physical, chart, labs and discussed the procedure including the risks, benefits and alternatives for the proposed anesthesia with the patient or authorized representative who has indicated his/her understanding and acceptance.     Dental advisory given  Plan Discussed with: CRNA  Anesthesia Plan Comments:          Anesthesia Quick Evaluation

## 2024-01-30 ENCOUNTER — Encounter (HOSPITAL_COMMUNITY)
Admission: RE | Disposition: A | Discharge status: Home / Self Care | Payer: Self-pay | Source: Home / Self Care | Attending: Gastroenterology

## 2024-01-30 ENCOUNTER — Ambulatory Visit (HOSPITAL_COMMUNITY): Payer: Self-pay | Admitting: Anesthesiology

## 2024-01-30 ENCOUNTER — Other Ambulatory Visit: Payer: Self-pay

## 2024-01-30 ENCOUNTER — Ambulatory Visit (HOSPITAL_COMMUNITY)
Admission: RE | Admit: 2024-01-30 | Discharge: 2024-01-30 | Disposition: A | Discharge status: Home / Self Care | Attending: Gastroenterology | Admitting: Gastroenterology

## 2024-01-30 ENCOUNTER — Encounter (HOSPITAL_COMMUNITY): Payer: Self-pay | Admitting: Gastroenterology

## 2024-01-30 DIAGNOSIS — D123 Benign neoplasm of transverse colon: Secondary | ICD-10-CM | POA: Insufficient documentation

## 2024-01-30 DIAGNOSIS — I509 Heart failure, unspecified: Secondary | ICD-10-CM | POA: Insufficient documentation

## 2024-01-30 DIAGNOSIS — K760 Fatty (change of) liver, not elsewhere classified: Secondary | ICD-10-CM | POA: Diagnosis not present

## 2024-01-30 DIAGNOSIS — D120 Benign neoplasm of cecum: Secondary | ICD-10-CM | POA: Diagnosis not present

## 2024-01-30 DIAGNOSIS — Z139 Encounter for screening, unspecified: Secondary | ICD-10-CM | POA: Diagnosis not present

## 2024-01-30 DIAGNOSIS — Z86718 Personal history of other venous thrombosis and embolism: Secondary | ICD-10-CM | POA: Insufficient documentation

## 2024-01-30 DIAGNOSIS — I110 Hypertensive heart disease with heart failure: Secondary | ICD-10-CM | POA: Diagnosis not present

## 2024-01-30 DIAGNOSIS — D122 Benign neoplasm of ascending colon: Secondary | ICD-10-CM | POA: Insufficient documentation

## 2024-01-30 DIAGNOSIS — I2510 Atherosclerotic heart disease of native coronary artery without angina pectoris: Secondary | ICD-10-CM | POA: Diagnosis not present

## 2024-01-30 DIAGNOSIS — K219 Gastro-esophageal reflux disease without esophagitis: Secondary | ICD-10-CM | POA: Insufficient documentation

## 2024-01-30 DIAGNOSIS — I739 Peripheral vascular disease, unspecified: Secondary | ICD-10-CM | POA: Diagnosis not present

## 2024-01-30 DIAGNOSIS — K529 Noninfective gastroenteritis and colitis, unspecified: Secondary | ICD-10-CM | POA: Diagnosis not present

## 2024-01-30 DIAGNOSIS — K5730 Diverticulosis of large intestine without perforation or abscess without bleeding: Secondary | ICD-10-CM | POA: Insufficient documentation

## 2024-01-30 DIAGNOSIS — K635 Polyp of colon: Secondary | ICD-10-CM | POA: Diagnosis not present

## 2024-01-30 DIAGNOSIS — I700 Atherosclerosis of aorta: Secondary | ICD-10-CM | POA: Insufficient documentation

## 2024-01-30 DIAGNOSIS — R197 Diarrhea, unspecified: Secondary | ICD-10-CM | POA: Diagnosis not present

## 2024-01-30 HISTORY — PX: COLONOSCOPY: SHX5424

## 2024-01-30 SURGERY — COLONOSCOPY
Anesthesia: Monitor Anesthesia Care

## 2024-01-30 MED ORDER — PROPOFOL 500 MG/50ML IV EMUL
INTRAVENOUS | Status: DC | PRN
  Administered 2024-01-30: 180 ug/kg/min via INTRAVENOUS

## 2024-01-30 MED ORDER — SODIUM CHLORIDE 0.9 % IV SOLN: INTRAVENOUS | Status: DC

## 2024-01-30 MED ORDER — LIDOCAINE 2% (20 MG/ML) 5 ML SYRINGE
INTRAMUSCULAR | Status: DC | PRN
  Administered 2024-01-30: 40 mg via INTRAVENOUS

## 2024-01-30 MED ORDER — PROPOFOL 10 MG/ML IV BOLUS
INTRAVENOUS | Status: DC | PRN
  Administered 2024-01-30 (×10): 20 mg via INTRAVENOUS

## 2024-01-30 NOTE — Discharge Instructions (Signed)
YOU HAD AN ENDOSCOPIC PROCEDURE TODAY: Refer to the procedure report and other information in the discharge instructions given to you for any specific questions about what was found during the examination. If this information does not answer your questions, please call [facility] at [phone] to clarify.   YOU SHOULD EXPECT: Some feelings of bloating in the abdomen. Passage of more gas than usual. Walking can help get rid of the air that was put into your GI tract during the procedure and reduce the bloating. If you had a lower endoscopy (such as a colonoscopy or flexible sigmoidoscopy) you may notice spotting of blood in your stool or on the toilet paper. Some abdominal soreness may be present for a day or two, also.  DIET: Your first meal following the procedure should be a light meal and then it is ok to progress to your normal diet. A half-sandwich or bowl of soup is an example of a good first meal. Heavy or fried foods are harder to digest and may make you feel nauseous or bloated. Drink plenty of fluids but you should avoid alcoholic beverages for 24 hours.  ACTIVITY: Your care partner should take you home directly after the procedure. You should plan to take it easy, moving slowly for the rest of the day. You can resume normal activity the day after the procedure however YOU SHOULD NOT DRIVE, use power tools, machinery or perform tasks that involve climbing or major physical exertion for 24 hours (because of the sedation medicines used during the test).   SYMPTOMS TO REPORT IMMEDIATELY: A gastroenterologist can be reached at any hour. Please call [phone]  for any of the following symptoms:  Following lower endoscopy (colonoscopy, flexible sigmoidoscopy) Excessive amounts of blood in the stool  Significant tenderness, worsening of abdominal pains  Swelling of the abdomen that is new, acute  Fever of 100 or higher    FOLLOW UP:  If any biopsies were taken you will be  contacted by phone or by letter within the next 1-3 weeks. Call [phone]  if you have not heard about the biopsies in 3 weeks.  Please also call with any specific questions about appointments or follow up tests.  

## 2024-01-30 NOTE — Anesthesia Postprocedure Evaluation (Signed)
 Anesthesia Post Note  Patient: Debbie Barry  Procedure(s) Performed: COLONOSCOPY     Patient location during evaluation: Endoscopy Anesthesia Type: MAC Level of consciousness: awake and alert Pain management: pain level controlled Vital Signs Assessment: post-procedure vital signs reviewed and stable Respiratory status: spontaneous breathing, nonlabored ventilation, respiratory function stable and patient connected to nasal cannula oxygen Cardiovascular status: stable and blood pressure returned to baseline Postop Assessment: no apparent nausea or vomiting Anesthetic complications: no   No notable events documented.  Last Vitals:  Vitals:   01/30/24 1049 01/30/24 1055  BP: (!) 151/80 (!) 170/88  Pulse: 78 71  Resp: 18 14  Temp:    SpO2: 100% 97%    Last Pain:  Vitals:   01/30/24 1055  TempSrc:   PainSc: 0-No pain                 Thom JONELLE Peoples

## 2024-01-30 NOTE — Op Note (Signed)
 [facility] Patient Name: Debbie Barry Procedure Date: 01/30/2024 MRN: [id number] Attending MD: Belvie Just , MD, [id number] Date of Birth: [birth year] CSN: [id number] Age: 73 Admit Type: Outpatient Procedure:                Colonoscopy Indications:              Chronic diarrhea Providers:                Belvie Just, MD, Darleene Bare, RN, Curtistine Bishop,                            Technician Referring MD:              Medicines:                Propofol  per Anesthesia Complications:            No immediate complications. Estimated Blood Loss:     Estimated blood loss: none. Procedure:                Pre-Anesthesia Assessment:                           - Prior to the procedure, a History and Physical                            was performed, and patient medications and                            allergies were reviewed. The patient's tolerance of                            previous anesthesia was also reviewed. The risks                            and benefits of the procedure and the sedation                            options and risks were discussed with the patient.                            All questions were answered, and informed consent                            was obtained. Prior Anticoagulants: The patient has                            taken no anticoagulant or antiplatelet agents. ASA                            Grade Assessment: III - A patient with severe                            systemic disease. After reviewing the risks and                            benefits, the  patient was deemed in satisfactory                            condition to undergo the procedure.                           - Sedation was administered by an anesthesia                            professional. Deep sedation was attained.                           After obtaining informed consent, the colonoscope                            was passed under direct vision. Throughout the                             procedure, the patient's blood pressure, pulse, and                            oxygen saturations were monitored continuously. The                            CF-HQ190L ([id number] Olympus colonoscope was                            introduced through the anus and advanced to the the                            cecum, identified by appendiceal orifice and                            ileocecal valve. The colonoscopy was performed                            without difficulty. The patient tolerated the                            procedure well. The quality of the bowel                            preparation was evaluated using the BBPS [facility]                            Bowel Preparation Scale) with scores of: Right                            Colon = 2 (minor amount of residual staining, small                            fragments of stool and/or opaque liquid, but mucosa  seen well), Transverse Colon = 3 (entire mucosa                            seen well with no residual staining, small                            fragments of stool or opaque liquid) and Left Colon                            = 2 (minor amount of residual staining, small                            fragments of stool and/or opaque liquid, but mucosa                            seen well). The total BBPS score equals 7. The                            quality of the bowel preparation was good. The                            ileocecal valve, appendiceal orifice, and rectum                            were photographed. Scope In: 10:05:25 AM Scope Out: 10:26:49 AM Scope Withdrawal Time: 0 hours 16 minutes 58 seconds  Total Procedure Duration: 0 hours 21 minutes 24 seconds  Findings:      Three sessile polyps were found in the transverse colon, ascending colon       and cecum. The polyps were 2 to 3 mm in size. These polyps were removed       with a cold snare. Resection and retrieval were  complete.      Normal mucosa was found in the entire colon. Biopsies for histology were       taken with a cold forceps from the ascending colon and descending colon       for evaluation of microscopic colitis.      Scattered large-mouthed, medium-mouthed and small-mouthed diverticula       were found in the sigmoid colon and descending colon. Impression:               - Three 2 to 3 mm polyps in the transverse colon,                            in the ascending colon and in the cecum, removed                            with a cold snare. Resected and retrieved.                           - Normal mucosa in the entire examined colon.                            Biopsied.                           -  Diverticulosis in the sigmoid colon and in the                            descending colon. Moderate Sedation:      Not Applicable - Patient had care per Anesthesia. Recommendation:           - Patient has a contact number available for                            emergencies. The signs and symptoms of potential                            delayed complications were discussed with the                            patient. Return to normal activities tomorrow.                            Written discharge instructions were provided to the                            patient.                           - Resume regular diet.                           - Continue present medications.                           - Await pathology results.                           - Repeat colonoscopy in 5 months for surveillance.                           - Return to GI clinic in 4 weeks. Procedure Code(s):        --- Professional ---                           [phone], Colonoscopy, flexible; with removal of                            tumor(s), polyp(s), or other lesion(s) by snare                            technique                           45380, 59, Colonoscopy, flexible; with biopsy,                            single or  multiple Diagnosis Code(s):        --- Professional ---                           K52.9, Noninfective  gastroenteritis and colitis,                            unspecified                           D12.3, Benign neoplasm of transverse colon (hepatic                            flexure or splenic flexure)                           D12.2, Benign neoplasm of ascending colon                           D12.0, Benign neoplasm of cecum                           K57.30, Diverticulosis of large intestine without                            perforation or abscess without bleeding CPT copyright 2022 American [employer] Association. All rights reserved. The codes documented in this report are preliminary and upon coder review may  be revised to meet current compliance requirements. Belvie Just, MD Belvie Just, MD 01/30/2024 10:35:02 AM This report has been signed electronically. Number of Addenda: 0

## 2024-01-30 NOTE — Transfer of Care (Signed)
 Immediate Anesthesia Transfer of Care Note  Patient: Debbie Barry  Procedure(s) Performed: COLONOSCOPY  Patient Location: Endoscopy Unit  Anesthesia Type:MAC  Level of Consciousness: awake  Airway & Oxygen Therapy: Patient Spontanous Breathing and Patient connected to face mask oxygen  Post-op Assessment: Report given to RN and Post -op Vital signs reviewed and stable  Post vital signs: Reviewed and stable  Last Vitals:  Vitals Value Taken Time  BP    Temp    Pulse    Resp    SpO2      Last Pain:  Vitals:   01/30/24 0858  TempSrc: Temporal  PainSc: 0-No pain         Complications: No notable events documented.

## 2024-01-30 NOTE — H&P (Signed)
 Debbie Barry HPI:  At this time the patient denies any problems with nausea, vomiting, fevers, chills, abdominal pain, constipation, hematochezia, melena, GERD, or dysphagia. The patient denies any known family history of colon cancers. No complaints of chest pain, SOB, MI, or sleep apnea.  A few years ago she was hospitalized for hematemesis.  At that time she was evaluated by Dr. Rosalie in the hospital.  She did follow up with him in the office.  An EGD was performed and she was diagnosed with a gastritis.  On 05/07/2021 she suffered a cardiac arrest when she was in the hospital.  One of the precipitating factors was her hypomagnesemia.  The patient reports having problems with diarrhea, but she uses magnesium .  She changed her magnesium  preparation to glycinate.  With the diarrhea she reports some problems with hematochezia on the toilet tissue.  She had a Cologuard with [facility] 3 years ago and it was normal  Past Medical History:  Diagnosis Date   Fatty liver    GERD (gastroesophageal reflux disease)    Hypertension    Nausea and vomiting 03/31/2021   Osteomyelitis (HCC)    right third toe   Peripheral vascular disease (HCC)    Ulcer 08/14/20    Past Surgical History:  Procedure Laterality Date   AMPUTATION Right 08/08/2017   Procedure: AMPUTATION RIGHT 3RD TOE;  Surgeon: Harden Jerona GAILS, MD;  Location: [facility] OR;  Service: Orthopedics;  Laterality: Right;   BIOPSY  08/14/2020   Procedure: BIOPSY;  Surgeon: Rosalie Kitchens, MD;  Location: [facility] ENDOSCOPY;  Service: Endoscopy;;   Bunionectomy Right 2006     Right   Bunionectony Left 2006      August   COLONOSCOPY N/A 01/21/2014   Procedure: COLONOSCOPY;  Surgeon: Belvie JONETTA Just, MD;  Location: [facility] ENDOSCOPY;  Service: Endoscopy;  Laterality: N/A;   ESOPHAGOGASTRODUODENOSCOPY (EGD) WITH PROPOFOL  N/A 08/14/2020   Procedure: ESOPHAGOGASTRODUODENOSCOPY (EGD) WITH PROPOFOL ;  Surgeon: Rosalie Kitchens, MD;  Location: [facility] ENDOSCOPY;  Service: Endoscopy;   Laterality: N/A;   IR THORACENTESIS ASP PLEURAL SPACE W/IMG GUIDE  05/03/2021   RIGHT/LEFT HEART CATH AND CORONARY ANGIOGRAPHY N/A 05/11/2021   Procedure: RIGHT/LEFT HEART CATH AND CORONARY ANGIOGRAPHY;  Surgeon: Burnard Debby LABOR, MD;  Location: [facility];  Service: Cardiovascular;  Laterality: N/A;   TOTAL HIP ARTHROPLASTY Left 05/11/2022   TUBAL LIGATION     WISDOM TOOTH EXTRACTION      Family History  Problem Relation Age of Onset   Breast cancer Mother    Colon cancer Mother    Heart Problems Mother    Hypertension Mother    Heart disease Mother    Rheum arthritis Mother    Hypertension Father    Heart disease Father    Multiple myeloma Father    Osteoarthritis Father    HIV/AIDS Brother    Hypertension Son    Hypertension Daughter    Lupus Cousin    Heart disease Cousin        has pacemaker    Social History:  reports that she has never smoked. She has been exposed to tobacco smoke. She has never used smokeless tobacco. She reports current alcohol use. She reports that she does not use drugs.  Allergies:  Allergies  Allergen Reactions   Carvedilol  Diarrhea    Medications: Scheduled: Continuous:  sodium chloride  20 mL/hr at 01/30/24 0943    No results found for this or any previous visit (from the past 24 hours).  No results found.  ROS:  As stated above in the HPI otherwise negative.  Blood pressure (!) 152/97, pulse 91, temperature (!) 97.2 F (36.2 C), temperature source Temporal, resp. rate 16, height 5' (1.524 m), weight 81.6 kg, SpO2 97%.    PE: Gen: NAD, Alert and Oriented HEENT:  [place]/AT, EOMI Neck: Supple, no LAD Lungs: CTA Bilaterally CV: RRR without M/G/R ABD: Soft, NTND, +BS Ext: No C/C/E  Assessment/Plan: 1) Diarrhea - colonoscopy with biopsies.  Tyberius Ryner D 01/30/2024, 9:51 AM

## 2024-02-02 ENCOUNTER — Encounter (HOSPITAL_COMMUNITY): Payer: Self-pay | Admitting: Gastroenterology

## 2024-02-02 LAB — SURGICAL PATHOLOGY

## 2024-02-21 ENCOUNTER — Other Ambulatory Visit: Payer: Self-pay | Admitting: Cardiovascular Disease

## 2024-02-21 ENCOUNTER — Other Ambulatory Visit: Payer: Self-pay | Admitting: Internal Medicine

## 2024-02-21 DIAGNOSIS — N280 Ischemia and infarction of kidney: Secondary | ICD-10-CM

## 2024-03-11 ENCOUNTER — Encounter: Payer: Self-pay | Admitting: Cardiovascular Disease

## 2024-03-15 DIAGNOSIS — I5020 Unspecified systolic (congestive) heart failure: Secondary | ICD-10-CM | POA: Diagnosis not present

## 2024-03-15 DIAGNOSIS — R197 Diarrhea, unspecified: Secondary | ICD-10-CM | POA: Diagnosis not present

## 2024-03-15 DIAGNOSIS — Z860100 Personal history of colon polyps, unspecified: Secondary | ICD-10-CM | POA: Diagnosis not present

## 2024-04-07 ENCOUNTER — Other Ambulatory Visit: Payer: Self-pay | Admitting: Internal Medicine

## 2024-04-07 DIAGNOSIS — Z1231 Encounter for screening mammogram for malignant neoplasm of breast: Secondary | ICD-10-CM

## 2024-05-03 ENCOUNTER — Encounter: Payer: Self-pay | Admitting: Radiology

## 2024-05-12 ENCOUNTER — Other Ambulatory Visit: Payer: Self-pay | Admitting: Cardiovascular Disease

## 2024-05-13 ENCOUNTER — Other Ambulatory Visit: Payer: Self-pay | Admitting: Cardiovascular Disease

## 2024-07-08 ENCOUNTER — Encounter: Payer: Self-pay | Admitting: Internal Medicine

## 2024-07-08 ENCOUNTER — Ambulatory Visit: Payer: Self-pay | Admitting: Internal Medicine

## 2024-07-08 VITALS — BP 110/74 | HR 75 | Temp 98.3°F | Ht 60.0 in | Wt 190.0 lb

## 2024-07-08 DIAGNOSIS — Z8711 Personal history of peptic ulcer disease: Secondary | ICD-10-CM

## 2024-07-08 DIAGNOSIS — I110 Hypertensive heart disease with heart failure: Secondary | ICD-10-CM | POA: Diagnosis not present

## 2024-07-08 DIAGNOSIS — I2510 Atherosclerotic heart disease of native coronary artery without angina pectoris: Secondary | ICD-10-CM | POA: Diagnosis not present

## 2024-07-08 DIAGNOSIS — R2231 Localized swelling, mass and lump, right upper limb: Secondary | ICD-10-CM | POA: Diagnosis not present

## 2024-07-08 DIAGNOSIS — F331 Major depressive disorder, recurrent, moderate: Secondary | ICD-10-CM

## 2024-07-08 DIAGNOSIS — K219 Gastro-esophageal reflux disease without esophagitis: Secondary | ICD-10-CM | POA: Diagnosis not present

## 2024-07-08 DIAGNOSIS — R1011 Right upper quadrant pain: Secondary | ICD-10-CM | POA: Diagnosis not present

## 2024-07-08 DIAGNOSIS — W010XXA Fall on same level from slipping, tripping and stumbling without subsequent striking against object, initial encounter: Secondary | ICD-10-CM

## 2024-07-08 DIAGNOSIS — I5022 Chronic systolic (congestive) heart failure: Secondary | ICD-10-CM

## 2024-07-08 DIAGNOSIS — I462 Cardiac arrest due to underlying cardiac condition: Secondary | ICD-10-CM

## 2024-07-08 MED ORDER — SUCRALFATE 1 G PO TABS: ORAL_TABLET | ORAL | 3 refills | Status: AC

## 2024-07-08 MED ORDER — PANTOPRAZOLE SODIUM 40 MG PO TBEC: 40.0000 mg | DELAYED_RELEASE_TABLET | Freq: Every day | ORAL | 2 refills | Status: AC

## 2024-07-08 NOTE — Progress Notes (Signed)
 I,[name], CMA,acting as a neurosurgeon for [name], MD.,have documented all relevant documentation on the behalf of [name], MD,as directed by  [name], MD while in the presence of [name], MD.  Subjective:  Patient ID: Debbie Barry , female    DOB: [birth year] , 74 y.o.   MRN: [id number]  Chief Complaint  Patient presents with   Hypertension    Patient presents today for bpc. She reports compliance with medication. Denies headache, chest pain & sob.  She reports a fall 2 weeks ago. She reports having a knot on her right arm.     HPI Discussed the use of AI scribe software for clinical note transcription with the patient, who gave verbal consent to proceed.  History of Present Illness Debbie Barry is a 74 year old female who presents for a Medicare wellness visit.  She experiences occasional shortness of breath and swelling in her left leg. She continues to take Eliquis  twice daily and metoprolol  25 mg daily. She also takes pantoprazole  as needed for reflux symptoms, which she experiences as a sensation in her throat.  She has a history of an ulcer diagnosed in 2022 and experiences occasional abdominal pain, especially after consuming spicy foods. She takes pantoprazole  for this condition but not consistently every day. A colonoscopy performed a few months ago revealed polyps that were biopsied, with no other abnormalities detected. She experiences loose stools several times a week and takes diarrhea medication as advised by her gastroenterologist.  She reports a fall two weeks ago after tripping over her cat, resulting in back and arm pain. She did not seek medical attention but applied ice and used a heating pad. She has a large bruise on her right arm, which she attributes to the fall. She also vomited blood on the day of the fall and experienced severe stomach pain.  She has a history of gallbladder wall thickening noted in an ultrasound from 2022.  She retains her gallbladder and has not had any recent imaging studies. She experiences diarrhea and wonders if it might be related to her gallbladder condition.  She mentions a past referral to an osteoporosis clinic but did not pursue treatment due to cost concerns. She has an upcoming appointment with a liver specialist in March.  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 She took a week off recently, which is unusual for her as she typically works continuously.   Hypertension This is a chronic problem. The current episode started more than 1 year ago. Pertinent negatives include no blurred vision or chest pain. Risk factors for coronary artery disease include obesity, sedentary lifestyle and post-menopausal state. Past treatments include ACE inhibitors, calcium  channel blockers and diuretics. The current treatment provides mild improvement.     Past Medical History:  Diagnosis Date   Fatty liver    GERD (gastroesophageal reflux disease)    Hypertension    Nausea and vomiting 03/31/2021   Osteomyelitis (HCC)    right third toe   Peripheral vascular disease    Ulcer 08/14/20     Family History  Problem Relation Age of Onset   Breast cancer Mother    Colon cancer Mother    Heart Problems Mother    Hypertension Mother    Heart disease Mother    Rheum arthritis Mother    Hypertension Father    Heart disease Father    Multiple myeloma Father  Osteoarthritis Father    HIV/AIDS Brother    Hypertension Son    Hypertension Daughter    Lupus Cousin    Heart disease Cousin        has pacemaker    Current Medications[1]   Allergies[2]   Review of Systems  Constitutional: Negative.   Eyes:  Negative for blurred vision.  Respiratory: Negative.    Cardiovascular: Negative.  Negative for chest pain.  Gastrointestinal: Negative.   Neurological: Negative.   Psychiatric/Behavioral: Negative.       Today's Vitals   07/08/24 1513  BP: 110/74   Pulse: 75  Temp: 98.3 F (36.8 C)  SpO2: 98%  Weight: 190 lb (86.2 kg)  Height: 5' (1.524 m)   Body mass index is 37.11 kg/m.  Wt Readings from Last 3 Encounters:  07/08/24 190 lb (86.2 kg)  01/30/24 180 lb (81.6 kg)  01/06/24 181 lb 9.6 oz (82.4 kg)    The ASCVD Risk score (Arnett DK, et al., 2019) failed to calculate for the following reasons:   The valid total cholesterol range is 130 to 320 mg/dL  Objective:  Physical Exam Vitals and nursing note reviewed.  Constitutional:      Appearance: Normal appearance.  HENT:     Head: Normocephalic and atraumatic.  Eyes:     Extraocular Movements: Extraocular movements intact.  Cardiovascular:     Rate and Rhythm: Normal rate and regular rhythm.     Heart sounds: Normal heart sounds.  Pulmonary:     Effort: Pulmonary effort is normal.     Breath sounds: Normal breath sounds.  Musculoskeletal:     Cervical back: Normal range of motion.     Comments: Firm soft tissue mass R upr arm Ecchymoses also present  Skin:    General: Skin is warm.  Neurological:     General: No focal deficit present.     Mental Status: She is alert.  Psychiatric:        Mood and Affect: Mood normal.        Behavior: Behavior normal.      Assessment And Plan:   Assessment & Plan Hypertensive heart disease with chronic systolic congestive heart failure (HCC) [I11.0, I50.22] Chronic, well controlled.  Continues on Eliquis  and metoprolol . Reports occasional shortness of breath. No significant edema, but slight leg swelling noted. - Continue Eliquis  twice daily. - Continue metoprolol  25 mg daily. Coronary artery disease involving native coronary artery of native heart without angina pectoris [I25.10] Chronic, LDL goal is less than 70.  She will continue with rosuvastatin  20mg  daily.   - Encouraged to follow heart healthy lifestyle - Increase daily activity, aiming for 150 minutes per week Gastroesophageal reflux disease without  esophagitis Intermittent reflux symptoms managed with pantoprazole  as needed. History of peptic ulcer disease. Discussed importance of daily pantoprazole  for ulcer management. - Prescribed pantoprazole  daily. - Checked blood count to monitor for gastrointestinal bleeding. - Stop eating 3 hrs prior to lying down. Moderate episode of recurrent major depressive disorder (HCC) [F33.1] Chronic, sx stable on sertraline .  Fall from slip, trip, or stumble, initial encounter Recent fall resulting in arm and back contusions and hematoma. On anticoagulation therapy. No medical attention sought post-fall. Bruising and hematoma likely due to anticoagulation. - Advised continued use of ice and heating pad for hematoma. - Recommended urgent care for x-rays if pain persists. RUQ pain Chronic diarrhea with occasional right upper quadrant pain. Previous colonoscopy showed polyps but no other abnormalities. Gallbladder wall thickening noted in  2022. Differential includes gallbladder disease. - Ordered ultrasound of gallbladder. - Will consider HIDA scan if ultrasound is normal.  Morbid (severe) obesity due to excess calories (HCC) BMI 37 with underlying HTN.  Reports weight gain despite unchanged diet. Sedentary lifestyle due to work. Discussed importance of movement to maintain weight. - Encouraged movement every hour at work. - Advised on calf exercises to improve circulation. History of peptic ulcer disease  Localized swelling, mass, or lump of right upper extremity She agrees to CT RUE to evaluate extent of hematoma.    Orders Placed This Encounter  Procedures   CT EXTREM UP ENTIRE ARM RIGHT WO CONTRAST   CBC   CMP14+EGFR   Brain natriuretic peptide   Return if symptoms worsen or fail to improve.  Patient was given opportunity to ask questions. Patient verbalized understanding of the plan and was able to repeat key elements of the plan. All questions were answered to their satisfaction.    I,  [name], MD, have reviewed all documentation for this visit. The documentation on 07/08/2024 for the exam, diagnosis, procedures, and orders are all accurate and complete.   IF YOU HAVE BEEN REFERRED TO A SPECIALIST, IT MAY TAKE 1-2 WEEKS TO SCHEDULE/PROCESS THE REFERRAL. IF YOU HAVE NOT HEARD FROM US /SPECIALIST IN TWO WEEKS, PLEASE GIVE US  A CALL AT [phone].      [1]  Current Outpatient Medications:    apixaban  (ELIQUIS ) 5 MG TABS tablet, TAKE 1 TABLET(5 MG) BY MOUTH TWICE DAILY, Disp: 180 tablet, Rfl: 1   Cholecalciferol (VITAMIN D3) 5000 units CAPS, Take 5,000 Units by mouth daily., Disp: , Rfl:    Magnesium  500 MG TABS, Take 1 tablet by mouth daily., Disp: , Rfl:    metoprolol  succinate (TOPROL -XL) 25 MG 24 hr tablet, Take 1 tablet (25 mg total) by mouth daily., Disp: 90 tablet, Rfl: 1   Multiple Vitamin (MULTIVITAMIN WITH MINERALS) TABS tablet, Take 1 tablet by mouth daily., Disp: , Rfl:    OVER THE COUNTER MEDICATION, Take 1 capsule by mouth daily. Patient takes a circulation pill from [employer], last took on Thursday., Disp: , Rfl:    potassium chloride  SA (KLOR-CON  M) 20 MEQ tablet, TAKE 1 TABLET(20 MEQ) BY MOUTH TWICE DAILY, Disp: 60 tablet, Rfl: 2   rosuvastatin  (CRESTOR ) 20 MG tablet, Take 1 tablet (20 mg total) by mouth daily., Disp: 90 tablet, Rfl: 3   sacubitril -valsartan  (ENTRESTO ) 24-26 MG, TAKE 1 TABLET BY MOUTH TWICE DAILY, Disp: 180 tablet, Rfl: 3   sertraline  (ZOLOFT ) 50 MG tablet, TAKE 1 TABLET(50 MG) BY MOUTH DAILY, Disp: 90 tablet, Rfl: 2   spironolactone  (ALDACTONE ) 25 MG tablet, TAKE 1/2 TABLET(12.5 MG) BY MOUTH DAILY, Disp: 45 tablet, Rfl: 1   tetrahydrozoline 0.05 % ophthalmic solution, Place 1 drop into both eyes daily., Disp: , Rfl:    torsemide  (DEMADEX ) 20 MG tablet, Take 1 tablet (20 mg total) by mouth daily as needed (swelling)., Disp: 20 tablet, Rfl: 1   pantoprazole  (PROTONIX ) 40 MG tablet, Take 1 tablet (40 mg total) by mouth daily. As needed,  Disp: 90 tablet, Rfl: 2   sucralfate  (CARAFATE ) 1 g tablet, TAKE 1 TABLET(1 GRAM) BY MOUTH TWICE DAILY prn, Disp: 60 tablet, Rfl: 3 [2]  Allergies Allergen Reactions   Carvedilol  Diarrhea

## 2024-07-08 NOTE — Patient Instructions (Signed)
 Hypertension, Adult Hypertension is another name for high blood pressure. High blood pressure forces your heart to work harder to pump blood. This can cause problems over time. There are two numbers in a blood pressure reading. There is a top number (systolic) over a bottom number (diastolic). It is best to have a blood pressure that is below 120/80. What are the causes? The cause of this condition is not known. Some other conditions can lead to high blood pressure. What increases the risk? Some lifestyle factors can make you more likely to develop high blood pressure: Smoking. Not getting enough exercise or physical activity. Being overweight. Having too much fat, sugar, calories, or salt (sodium) in your diet. Drinking too much alcohol. Other risk factors include: Having any of these conditions: Heart disease. Diabetes. High cholesterol. Kidney disease. Obstructive sleep apnea. Having a family history of high blood pressure and high cholesterol. Age. The risk increases with age. Stress. What are the signs or symptoms? High blood pressure may not cause symptoms. Very high blood pressure (hypertensive crisis) may cause: Headache. Fast or uneven heartbeats (palpitations). Shortness of breath. Nosebleed. Vomiting or feeling like you may vomit (nauseous). Changes in how you see. Very bad chest pain. Feeling dizzy. Seizures. How is this treated? This condition is treated by making healthy lifestyle changes, such as: Eating healthy foods. Exercising more. Drinking less alcohol. Your doctor may prescribe medicine if lifestyle changes do not help enough and if: Your top number is above 130. Your bottom number is above 80. Your personal target blood pressure may vary. Follow these instructions at home: Eating and drinking  If told, follow the DASH eating plan. To follow this plan: Fill one half of your plate at each meal with fruits and vegetables. Fill one fourth of your plate  at each meal with whole grains. Whole grains include whole-wheat pasta, brown rice, and whole-grain bread. Eat or drink low-fat dairy products, such as skim milk or low-fat yogurt. Fill one fourth of your plate at each meal with low-fat (lean) proteins. Low-fat proteins include fish, chicken without skin, eggs, beans, and tofu. Avoid fatty meat, cured and processed meat, or chicken with skin. Avoid pre-made or processed food. Limit the amount of salt in your diet to less than 1,500 mg each day. Do not drink alcohol if: Your doctor tells you not to drink. You are pregnant, may be pregnant, or are planning to become pregnant. If you drink alcohol: Limit how much you have to: 0-1 drink a day for women. 0-2 drinks a day for men. Know how much alcohol is in your drink. In the [place] one drink equals one 12 oz bottle of beer (355 mL), one 5 oz glass of wine (148 mL), or one 1 oz glass of hard liquor (44 mL). Lifestyle  Work with your doctor to stay at a healthy weight or to lose weight. Ask your doctor what the best weight is for you. Get at least 30 minutes of exercise that causes your heart to beat faster (aerobic exercise) most days of the week. This may include walking, swimming, or biking. Get at least 30 minutes of exercise that strengthens your muscles (resistance exercise) at least 3 days a week. This may include lifting weights or doing Pilates. Do not smoke or use any products that contain nicotine or tobacco. If you need help quitting, ask your doctor. Check your blood pressure at home as told by your doctor. Keep all follow-up visits. Medicines Take over-the-counter and prescription medicines  only as told by your doctor. Follow directions carefully. Do not skip doses of blood pressure medicine. The medicine does not work as well if you skip doses. Skipping doses also puts you at risk for problems. Ask your doctor about side effects or reactions to medicines that you should watch  for. Contact a doctor if: You think you are having a reaction to the medicine you are taking. You have headaches that keep coming back. You feel dizzy. You have swelling in your ankles. You have trouble with your vision. Get help right away if: You get a very bad headache. You start to feel mixed up (confused). You feel weak or numb. You feel faint. You have very bad pain in your: Chest. Belly (abdomen). You vomit more than once. You have trouble breathing. These symptoms may be an emergency. Get help right away. Call 911. Do not wait to see if the symptoms will go away. Do not drive yourself to the hospital. Summary Hypertension is another name for high blood pressure. High blood pressure forces your heart to work harder to pump blood. For most people, a normal blood pressure is less than 120/80. Making healthy choices can help lower blood pressure. If your blood pressure does not get lower with healthy choices, you may need to take medicine. This information is not intended to replace advice given to you by your health care provider. Make sure you discuss any questions you have with your health care provider. Document Revised: 04/05/2021 Document Reviewed: 04/05/2021 Elsevier Patient Education  2024 [employer].

## 2024-07-10 LAB — CBC
Hematocrit: 36.3 % (ref 34.0–46.6)
Hemoglobin: 12.1 g/dL (ref 11.1–15.9)
MCH: 33.7 pg — ABNORMAL HIGH (ref 26.6–33.0)
MCHC: 33.3 g/dL (ref 31.5–35.7)
MCV: 101 fL — ABNORMAL HIGH (ref 79–97)
Platelets: 215 x10E3/uL (ref 150–450)
RBC: 3.59 x10E6/uL — ABNORMAL LOW (ref 3.77–5.28)
RDW: 13.5 % (ref 11.7–15.4)
WBC: 5.4 x10E3/uL (ref 3.4–10.8)

## 2024-07-10 LAB — CMP14+EGFR
ALT: 13 IU/L (ref 0–32)
AST: 24 IU/L (ref 0–40)
Albumin: 3.6 g/dL — ABNORMAL LOW (ref 3.8–4.8)
Alkaline Phosphatase: 170 IU/L — ABNORMAL HIGH (ref 49–135)
BUN/Creatinine Ratio: 15 (ref 12–28)
BUN: 11 mg/dL (ref 8–27)
Bilirubin Total: 0.5 mg/dL (ref 0.0–1.2)
CO2: 23 mmol/L (ref 20–29)
Calcium: 8.8 mg/dL (ref 8.7–10.3)
Chloride: 103 mmol/L (ref 96–106)
Creatinine, Ser: 0.72 mg/dL (ref 0.57–1.00)
Globulin, Total: 2.3 g/dL (ref 1.5–4.5)
Glucose: 86 mg/dL (ref 70–99)
Potassium: 4.5 mmol/L (ref 3.5–5.2)
Sodium: 138 mmol/L (ref 134–144)
Total Protein: 5.9 g/dL — ABNORMAL LOW (ref 6.0–8.5)
eGFR: 89 mL/min/1.73

## 2024-07-10 LAB — BRAIN NATRIURETIC PEPTIDE: BNP: 113.2 pg/mL — ABNORMAL HIGH (ref 0.0–100.0)

## 2024-07-13 ENCOUNTER — Ambulatory Visit: Payer: Self-pay | Admitting: Internal Medicine

## 2024-07-15 ENCOUNTER — Inpatient Hospital Stay: Admission: RE | Admit: 2024-07-15 | Discharge: 2024-07-15 | Attending: Internal Medicine | Admitting: Internal Medicine

## 2024-07-15 DIAGNOSIS — R2231 Localized swelling, mass and lump, right upper limb: Secondary | ICD-10-CM

## 2024-07-18 DIAGNOSIS — W010XXA Fall on same level from slipping, tripping and stumbling without subsequent striking against object, initial encounter: Secondary | ICD-10-CM | POA: Insufficient documentation

## 2024-07-18 NOTE — Assessment & Plan Note (Signed)
 Chronic, well controlled.  Continues on Eliquis  and metoprolol . Reports occasional shortness of breath. No significant edema, but slight leg swelling noted. - Continue Eliquis  twice daily. - Continue metoprolol  25 mg daily.

## 2024-07-18 NOTE — Assessment & Plan Note (Signed)
 Chronic, LDL goal is less than 70.  She will continue with rosuvastatin  20mg  daily.   - Encouraged to follow heart healthy lifestyle - Increase daily activity, aiming for 150 minutes per week

## 2024-07-18 NOTE — Assessment & Plan Note (Signed)
 Recent fall resulting in arm and back contusions and hematoma. On anticoagulation therapy. No medical attention sought post-fall. Bruising and hematoma likely due to anticoagulation. - Advised continued use of ice and heating pad for hematoma. - Recommended urgent care for x-rays if pain persists.

## 2024-07-18 NOTE — Assessment & Plan Note (Signed)
 Intermittent reflux symptoms managed with pantoprazole  as needed. History of peptic ulcer disease. Discussed importance of daily pantoprazole  for ulcer management. - Prescribed pantoprazole  daily. - Checked blood count to monitor for gastrointestinal bleeding. - Stop eating 3 hrs prior to lying down.

## 2024-07-18 NOTE — Assessment & Plan Note (Signed)
 BMI 37 with underlying HTN.  Reports weight gain despite unchanged diet. Sedentary lifestyle due to work. Discussed importance of movement to maintain weight. - Encouraged movement every hour at work. - Advised on calf exercises to improve circulation.

## 2024-07-18 NOTE — Assessment & Plan Note (Signed)
 Chronic, sx stable on sertraline .

## 2024-07-21 ENCOUNTER — Other Ambulatory Visit: Payer: Self-pay | Admitting: Internal Medicine

## 2024-07-21 DIAGNOSIS — S40021A Contusion of right upper arm, initial encounter: Secondary | ICD-10-CM

## 2024-07-21 DIAGNOSIS — R2231 Localized swelling, mass and lump, right upper limb: Secondary | ICD-10-CM

## 2024-07-28 ENCOUNTER — Ambulatory Visit: Payer: Self-pay

## 2024-07-30 ENCOUNTER — Ambulatory Visit

## 2024-08-04 ENCOUNTER — Ambulatory Visit: Payer: Self-pay

## 2025-01-12 ENCOUNTER — Encounter: Payer: Self-pay | Admitting: Internal Medicine
